# Patient Record
Sex: Female | Born: 1950 | Race: White | Hispanic: No | Marital: Married | Smoking: Never smoker
Health system: Southern US, Community
[De-identification: ages and names within clinical notes are randomized; demographics above are authoritative.]

## PROBLEM LIST (undated history)

## (undated) DIAGNOSIS — M5137 Other intervertebral disc degeneration, lumbosacral region: Secondary | ICD-10-CM

## (undated) DIAGNOSIS — I10 Essential (primary) hypertension: Secondary | ICD-10-CM

## (undated) DIAGNOSIS — Z9221 Personal history of antineoplastic chemotherapy: Secondary | ICD-10-CM

## (undated) DIAGNOSIS — I5030 Unspecified diastolic (congestive) heart failure: Secondary | ICD-10-CM

## (undated) DIAGNOSIS — T4145XA Adverse effect of unspecified anesthetic, initial encounter: Secondary | ICD-10-CM

## (undated) DIAGNOSIS — M792 Neuralgia and neuritis, unspecified: Secondary | ICD-10-CM

## (undated) DIAGNOSIS — D509 Iron deficiency anemia, unspecified: Secondary | ICD-10-CM

## (undated) DIAGNOSIS — M797 Fibromyalgia: Secondary | ICD-10-CM

## (undated) DIAGNOSIS — J189 Pneumonia, unspecified organism: Secondary | ICD-10-CM

## (undated) DIAGNOSIS — M549 Dorsalgia, unspecified: Secondary | ICD-10-CM

## (undated) DIAGNOSIS — F411 Generalized anxiety disorder: Secondary | ICD-10-CM

## (undated) DIAGNOSIS — T8859XA Other complications of anesthesia, initial encounter: Secondary | ICD-10-CM

## (undated) DIAGNOSIS — R519 Headache, unspecified: Secondary | ICD-10-CM

## (undated) DIAGNOSIS — M5030 Other cervical disc degeneration, unspecified cervical region: Secondary | ICD-10-CM

## (undated) DIAGNOSIS — Z9289 Personal history of other medical treatment: Secondary | ICD-10-CM

## (undated) DIAGNOSIS — R51 Headache: Secondary | ICD-10-CM

## (undated) DIAGNOSIS — I890 Lymphedema, not elsewhere classified: Secondary | ICD-10-CM

## (undated) DIAGNOSIS — Z923 Personal history of irradiation: Secondary | ICD-10-CM

## (undated) DIAGNOSIS — M51379 Other intervertebral disc degeneration, lumbosacral region without mention of lumbar back pain or lower extremity pain: Secondary | ICD-10-CM

## (undated) DIAGNOSIS — R112 Nausea with vomiting, unspecified: Secondary | ICD-10-CM

## (undated) DIAGNOSIS — K219 Gastro-esophageal reflux disease without esophagitis: Secondary | ICD-10-CM

## (undated) DIAGNOSIS — C50919 Malignant neoplasm of unspecified site of unspecified female breast: Secondary | ICD-10-CM

## (undated) DIAGNOSIS — I5189 Other ill-defined heart diseases: Secondary | ICD-10-CM

## (undated) DIAGNOSIS — M5135 Other intervertebral disc degeneration, thoracolumbar region: Secondary | ICD-10-CM

## (undated) DIAGNOSIS — E669 Obesity, unspecified: Secondary | ICD-10-CM

## (undated) DIAGNOSIS — E785 Hyperlipidemia, unspecified: Secondary | ICD-10-CM

## (undated) DIAGNOSIS — Z7189 Other specified counseling: Secondary | ICD-10-CM

## (undated) DIAGNOSIS — Z9889 Other specified postprocedural states: Secondary | ICD-10-CM

## (undated) DIAGNOSIS — F419 Anxiety disorder, unspecified: Secondary | ICD-10-CM

## (undated) DIAGNOSIS — R011 Cardiac murmur, unspecified: Secondary | ICD-10-CM

## (undated) DIAGNOSIS — G8929 Other chronic pain: Secondary | ICD-10-CM

## (undated) DIAGNOSIS — F339 Major depressive disorder, recurrent, unspecified: Secondary | ICD-10-CM

## (undated) DIAGNOSIS — Z86711 Personal history of pulmonary embolism: Secondary | ICD-10-CM

## (undated) DIAGNOSIS — Z8719 Personal history of other diseases of the digestive system: Secondary | ICD-10-CM

## (undated) DIAGNOSIS — J42 Unspecified chronic bronchitis: Secondary | ICD-10-CM

## (undated) HISTORY — DX: Personal history of irradiation: Z92.3

## (undated) HISTORY — DX: Hyperlipidemia, unspecified: E78.5

## (undated) HISTORY — PX: UMBILICAL HERNIA REPAIR: SHX196

## (undated) HISTORY — PX: BREAST LUMPECTOMY: SHX2

## (undated) HISTORY — DX: Other specified counseling: Z71.89

## (undated) HISTORY — PX: INGUINAL HERNIA REPAIR: SUR1180

## (undated) HISTORY — PX: FOOT SURGERY: SHX648

## (undated) HISTORY — PX: DILATION AND CURETTAGE OF UTERUS: SHX78

## (undated) HISTORY — PX: ABDOMINAL HYSTERECTOMY: SHX81

## (undated) HISTORY — PX: APPENDECTOMY: SHX54

## (undated) HISTORY — PX: BREAST BIOPSY: SHX20

## (undated) HISTORY — PX: TONSILLECTOMY: SUR1361

---

## 2012-10-02 DIAGNOSIS — C50919 Malignant neoplasm of unspecified site of unspecified female breast: Secondary | ICD-10-CM

## 2012-10-02 HISTORY — DX: Malignant neoplasm of unspecified site of unspecified female breast: C50.919

## 2012-12-24 DIAGNOSIS — T451X5A Adverse effect of antineoplastic and immunosuppressive drugs, initial encounter: Secondary | ICD-10-CM | POA: Diagnosis present

## 2012-12-24 DIAGNOSIS — I498 Other specified cardiac arrhythmias: Secondary | ICD-10-CM | POA: Diagnosis present

## 2012-12-24 DIAGNOSIS — Z9049 Acquired absence of other specified parts of digestive tract: Secondary | ICD-10-CM | POA: Diagnosis not present

## 2012-12-24 DIAGNOSIS — I10 Essential (primary) hypertension: Secondary | ICD-10-CM | POA: Diagnosis present

## 2012-12-24 DIAGNOSIS — R0902 Hypoxemia: Secondary | ICD-10-CM | POA: Diagnosis present

## 2012-12-24 DIAGNOSIS — M5137 Other intervertebral disc degeneration, lumbosacral region: Secondary | ICD-10-CM | POA: Diagnosis present

## 2012-12-24 DIAGNOSIS — K449 Diaphragmatic hernia without obstruction or gangrene: Secondary | ICD-10-CM | POA: Diagnosis present

## 2012-12-24 DIAGNOSIS — R911 Solitary pulmonary nodule: Secondary | ICD-10-CM | POA: Diagnosis present

## 2012-12-24 DIAGNOSIS — Z8711 Personal history of peptic ulcer disease: Secondary | ICD-10-CM | POA: Diagnosis not present

## 2012-12-24 DIAGNOSIS — K219 Gastro-esophageal reflux disease without esophagitis: Secondary | ICD-10-CM | POA: Diagnosis present

## 2012-12-24 DIAGNOSIS — F329 Major depressive disorder, single episode, unspecified: Secondary | ICD-10-CM | POA: Diagnosis present

## 2012-12-24 DIAGNOSIS — B9689 Other specified bacterial agents as the cause of diseases classified elsewhere: Secondary | ICD-10-CM | POA: Diagnosis not present

## 2012-12-24 DIAGNOSIS — K589 Irritable bowel syndrome without diarrhea: Secondary | ICD-10-CM | POA: Diagnosis present

## 2012-12-24 DIAGNOSIS — G589 Mononeuropathy, unspecified: Secondary | ICD-10-CM | POA: Diagnosis present

## 2012-12-24 DIAGNOSIS — F411 Generalized anxiety disorder: Secondary | ICD-10-CM | POA: Diagnosis present

## 2012-12-24 DIAGNOSIS — R195 Other fecal abnormalities: Secondary | ICD-10-CM | POA: Diagnosis present

## 2012-12-24 DIAGNOSIS — Z90710 Acquired absence of both cervix and uterus: Secondary | ICD-10-CM | POA: Diagnosis not present

## 2012-12-24 DIAGNOSIS — E876 Hypokalemia: Secondary | ICD-10-CM | POA: Diagnosis not present

## 2012-12-24 DIAGNOSIS — M479 Spondylosis, unspecified: Secondary | ICD-10-CM | POA: Diagnosis present

## 2012-12-24 DIAGNOSIS — N390 Urinary tract infection, site not specified: Secondary | ICD-10-CM | POA: Diagnosis not present

## 2012-12-24 DIAGNOSIS — D649 Anemia, unspecified: Secondary | ICD-10-CM | POA: Diagnosis not present

## 2012-12-24 DIAGNOSIS — C50919 Malignant neoplasm of unspecified site of unspecified female breast: Secondary | ICD-10-CM | POA: Diagnosis not present

## 2012-12-24 DIAGNOSIS — E871 Hypo-osmolality and hyponatremia: Secondary | ICD-10-CM | POA: Diagnosis not present

## 2012-12-24 DIAGNOSIS — J45909 Unspecified asthma, uncomplicated: Secondary | ICD-10-CM | POA: Diagnosis present

## 2012-12-24 DIAGNOSIS — J42 Unspecified chronic bronchitis: Secondary | ICD-10-CM | POA: Diagnosis present

## 2012-12-24 DIAGNOSIS — D6481 Anemia due to antineoplastic chemotherapy: Secondary | ICD-10-CM | POA: Diagnosis not present

## 2013-01-23 DIAGNOSIS — L02818 Cutaneous abscess of other sites: Secondary | ICD-10-CM | POA: Diagnosis not present

## 2013-01-23 DIAGNOSIS — K589 Irritable bowel syndrome without diarrhea: Secondary | ICD-10-CM | POA: Diagnosis present

## 2013-01-23 DIAGNOSIS — D6481 Anemia due to antineoplastic chemotherapy: Secondary | ICD-10-CM | POA: Diagnosis present

## 2013-01-23 DIAGNOSIS — M479 Spondylosis, unspecified: Secondary | ICD-10-CM | POA: Diagnosis present

## 2013-01-23 DIAGNOSIS — G8929 Other chronic pain: Secondary | ICD-10-CM | POA: Diagnosis present

## 2013-01-23 DIAGNOSIS — E876 Hypokalemia: Secondary | ICD-10-CM | POA: Diagnosis present

## 2013-01-23 DIAGNOSIS — L02419 Cutaneous abscess of limb, unspecified: Secondary | ICD-10-CM | POA: Diagnosis present

## 2013-01-23 DIAGNOSIS — M5137 Other intervertebral disc degeneration, lumbosacral region: Secondary | ICD-10-CM | POA: Diagnosis present

## 2013-01-23 DIAGNOSIS — R000 Tachycardia, unspecified: Secondary | ICD-10-CM | POA: Diagnosis present

## 2013-01-23 DIAGNOSIS — R52 Pain, unspecified: Secondary | ICD-10-CM | POA: Diagnosis present

## 2013-01-23 DIAGNOSIS — G589 Mononeuropathy, unspecified: Secondary | ICD-10-CM | POA: Diagnosis present

## 2013-01-23 DIAGNOSIS — Z90710 Acquired absence of both cervix and uterus: Secondary | ICD-10-CM | POA: Diagnosis not present

## 2013-01-23 DIAGNOSIS — F411 Generalized anxiety disorder: Secondary | ICD-10-CM | POA: Diagnosis present

## 2013-01-23 DIAGNOSIS — I959 Hypotension, unspecified: Secondary | ICD-10-CM | POA: Diagnosis not present

## 2013-01-23 DIAGNOSIS — Z888 Allergy status to other drugs, medicaments and biological substances status: Secondary | ICD-10-CM | POA: Diagnosis not present

## 2013-01-23 DIAGNOSIS — Z9049 Acquired absence of other specified parts of digestive tract: Secondary | ICD-10-CM | POA: Diagnosis not present

## 2013-01-23 DIAGNOSIS — Z79899 Other long term (current) drug therapy: Secondary | ICD-10-CM | POA: Diagnosis not present

## 2013-01-23 DIAGNOSIS — C50919 Malignant neoplasm of unspecified site of unspecified female breast: Secondary | ICD-10-CM | POA: Diagnosis not present

## 2013-01-23 DIAGNOSIS — Z91041 Radiographic dye allergy status: Secondary | ICD-10-CM | POA: Diagnosis not present

## 2013-01-23 DIAGNOSIS — Z9221 Personal history of antineoplastic chemotherapy: Secondary | ICD-10-CM | POA: Diagnosis not present

## 2013-01-23 DIAGNOSIS — Z8711 Personal history of peptic ulcer disease: Secondary | ICD-10-CM | POA: Diagnosis not present

## 2013-01-23 DIAGNOSIS — I890 Lymphedema, not elsewhere classified: Secondary | ICD-10-CM | POA: Diagnosis present

## 2013-01-23 DIAGNOSIS — L270 Generalized skin eruption due to drugs and medicaments taken internally: Secondary | ICD-10-CM | POA: Diagnosis not present

## 2013-01-23 DIAGNOSIS — L03818 Cellulitis of other sites: Secondary | ICD-10-CM | POA: Diagnosis not present

## 2013-01-23 DIAGNOSIS — T451X5A Adverse effect of antineoplastic and immunosuppressive drugs, initial encounter: Secondary | ICD-10-CM | POA: Diagnosis not present

## 2013-01-23 DIAGNOSIS — I10 Essential (primary) hypertension: Secondary | ICD-10-CM | POA: Diagnosis present

## 2013-02-19 ENCOUNTER — Ambulatory Visit
Admission: RE | Admit: 2013-02-19 | Discharge: 2013-02-19 | Disposition: A | Discharge status: Home / Self Care | Payer: BC Managed Care – PPO | Source: Ambulatory Visit | Attending: Radiation Oncology | Admitting: Radiation Oncology

## 2013-02-19 ENCOUNTER — Encounter: Payer: Self-pay | Admitting: Radiation Oncology

## 2013-02-19 VITALS — BP 104/60 | HR 90 | Temp 98.6°F | Ht 68.0 in | Wt 209.3 lb

## 2013-02-19 DIAGNOSIS — C50419 Malignant neoplasm of upper-outer quadrant of unspecified female breast: Secondary | ICD-10-CM | POA: Insufficient documentation

## 2013-02-19 DIAGNOSIS — F411 Generalized anxiety disorder: Secondary | ICD-10-CM | POA: Insufficient documentation

## 2013-02-19 DIAGNOSIS — M549 Dorsalgia, unspecified: Secondary | ICD-10-CM | POA: Insufficient documentation

## 2013-02-19 DIAGNOSIS — Z79899 Other long term (current) drug therapy: Secondary | ICD-10-CM | POA: Insufficient documentation

## 2013-02-19 DIAGNOSIS — R609 Edema, unspecified: Secondary | ICD-10-CM | POA: Insufficient documentation

## 2013-02-19 DIAGNOSIS — R222 Localized swelling, mass and lump, trunk: Secondary | ICD-10-CM | POA: Insufficient documentation

## 2013-02-19 DIAGNOSIS — Z9221 Personal history of antineoplastic chemotherapy: Secondary | ICD-10-CM | POA: Insufficient documentation

## 2013-02-19 DIAGNOSIS — C50911 Malignant neoplasm of unspecified site of right female breast: Secondary | ICD-10-CM

## 2013-02-19 DIAGNOSIS — Z170 Estrogen receptor positive status [ER+]: Secondary | ICD-10-CM | POA: Insufficient documentation

## 2013-02-19 DIAGNOSIS — G8929 Other chronic pain: Secondary | ICD-10-CM | POA: Insufficient documentation

## 2013-02-19 DIAGNOSIS — C50411 Malignant neoplasm of upper-outer quadrant of right female breast: Secondary | ICD-10-CM

## 2013-02-19 DIAGNOSIS — R5381 Other malaise: Secondary | ICD-10-CM | POA: Insufficient documentation

## 2013-02-19 HISTORY — DX: Malignant neoplasm of unspecified site of unspecified female breast: C50.919

## 2013-02-19 HISTORY — DX: Personal history of antineoplastic chemotherapy: Z92.21

## 2013-02-19 HISTORY — DX: Obesity, unspecified: E66.9

## 2013-02-19 HISTORY — DX: Personal history of pulmonary embolism: Z86.711

## 2013-02-19 HISTORY — DX: Dorsalgia, unspecified: M54.9

## 2013-02-19 HISTORY — DX: Other chronic pain: G89.29

## 2013-02-19 HISTORY — DX: Iron deficiency anemia, unspecified: D50.9

## 2013-02-19 LAB — CBC WITH DIFFERENTIAL/PLATELET
Eosinophils Absolute: 0 10*3/uL (ref 0.0–0.5)
MCV: 84.3 fL (ref 79.5–101.0)
MONO#: 0.6 10*3/uL (ref 0.1–0.9)
MONO%: 10.1 % (ref 0.0–14.0)
NEUT#: 3.6 10*3/uL (ref 1.5–6.5)
RBC: 3.7 10*6/uL (ref 3.70–5.45)
RDW: 18.1 % — ABNORMAL HIGH (ref 11.2–14.5)
WBC: 6.1 10*3/uL (ref 3.9–10.3)

## 2013-02-19 NOTE — Progress Notes (Signed)
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  Oncotype  Dx Score of 23  Did patient present with symptoms (if so, please note symptoms) or was this found on screening mammography?: Routine mammogram  Past/Anticipated interventions by surgeon, if any:  Lumpectomy ?  Past/Anticipated interventions by medical oncology, if any: 4 cycles of Taxotere and cytoxan  Lymphedema issues, if any:  Marked edema in lower legs since chemotherapy from the things to her feet.  Pain issues, if any:  Chronic Back Pain  SAFETY ISSUES:  Prior radiation? No  Pacemaker/ICD? No  Possible current pregnancy? No  Is the patient on methotrexate? No  Current Complaints / other details:  Bilateral edema lower extremities.  Was not referred to a Medical Oncologist and   Just moved to [place] and in the process of moving.  States this is very stressful and she reports increased anxiety.

## 2013-02-19 NOTE — Progress Notes (Signed)
Alexandria [name] here for consultation regarding her Invasive Ductal Carcinoma of the Right Breast

## 2013-02-19 NOTE — Progress Notes (Signed)
Radiation Oncology         [phone] ________________________________  Initial outpatient Consultation  Name: Alexandria [name] MRN: [id number]  Date: 02/19/2013  DOB: 01/12/1953  CC:No primary provider on file.  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 female who has worked in the past as an Insurance underwriter in [place]. She moved to [facility]  with her husband as he was relocated for new job. She recently completed chemotherapy for stage I grade 3 ER positive PR negative HER-2 negative left breast cancer. Her surgery had taken place on 10/21/12. Pathologic stage is as above. Margins clear. Her Oncotype DX score was 23. Therefore she received chemotherapy in the form of 4 cycles of Taxotere and Cytoxan. She completed her last cycle and 01/30/2013. She has had bilateral lower extremity edema and some skin changes as well as fatigue which she is still recovering from. She has some anxiety regarding her white counts which have been recovering.  She has numerous social stressors including her recent move, her has been signed new job, and her brother, who is actively dying of ALS in [place]. She has significant anxiety. She reports significant persistent lower extremity edema and skin changes which started with chemotherapy. She has also had weight gain with chemotherapy.  She reports chronic back problems -these are not new. She has been seen in the pain clinic in the past for this.  I am having her imaging get digitized from her outside hospital. 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 This was incidentally noted when she was intubated with sepsis in the hospital. According to the CT chest report from 10/02/2012, it has decreased in prominence  since 04/17/2011. It has been stable since 03/05/2012. It measures 1.8 x 0.5 cm as of her exam on 10/02/2012. It has not been biopsied per the patient   PREVIOUS RADIATION THERAPY: No  PAST MEDICAL HISTORY:  has a past medical history of Breast cancer (10/02/12); Iron deficiency anemia; Status post chemotherapy; Chronic back pain; pulmonary embolus; Neuropathy; and Obesity.    PAST SURGICAL HISTORY: Past Surgical History  Procedure Laterality Date  . Cesarean section    . Right breast lumpectomy      FAMILY HISTORY: family history is not on file.  SOCIAL HISTORY:  reports that she quit smoking about 6 years ago. Her smoking use included Cigarettes. She smoked 0.00 packs per day. She does not have any smokeless tobacco history on file.  ALLERGIES: Contrast media  MEDICATIONS:  Current Outpatient Prescriptions  Medication Sig Dispense Refill  . ferrous sulfate 325 (65 FE) MG tablet Take 325 mg by mouth daily with breakfast.      . furosemide (LASIX) 20 MG tablet Take 40 mg by mouth 2 (two) times daily. Take 1-2 prn      . HYDROmorphone (DILAUDID) 4 MG tablet Take 4 mg by mouth every 3 (three) hours as needed for pain.      Marland Kitchen levalbuterol (XOPENEX HFA) 45 MCG/ACT inhaler Inhale 1-2 puffs into the lungs as needed for wheezing.      . lidocaine-prilocaine (EMLA) cream Apply 1 application topically as needed.      Marland Kitchen LORazepam (ATIVAN) 1 MG tablet Take 1 mg by mouth every 8 (eight) hours  as needed for anxiety. Takes Q8hrs to Q 12 hrs.      . lubiprostone (AMITIZA) 24 MCG capsule Take 24 mcg by mouth 2 (two) times daily with a meal.      . morphine (MS CONTIN) 15 MG 12 hr tablet Take 30 mg by mouth 3 (three) times daily. Q 12 hours      . potassium chloride (KLOR-CON) 20 MEQ packet Take 20 mEq by mouth 2 (two) times daily.       Marland Kitchen LORazepam (ATIVAN) 0.5 MG tablet Take 1 tablet (0.5 mg total) by mouth once. Take 20-30 min before radiation treatments.  33 tablet  0   No current  facility-administered medications for this encounter.    REVIEW OF SYSTEMS:     Pertinent items are noted in HPI.   PHYSICAL EXAM:  height is 5\' 8"  (1.727 m) and weight is 209 lb 4.8 oz (94.938 kg). Her temperature is 98.6 F (37 C). Her blood pressure is 104/60 and her pulse is 90.   General: Alert and oriented, in mild distress HEENT: Head is normocephalic. Pupils are equally round and reactive to light. Extraocular movements are intact. Oropharynx is clear. Neck: Neck is supple, no palpable cervical or supraclavicular lymphadenopathy. Heart: Regular in rate and rhythm with no murmurs, rubs, or gallops. Chest: scattered wheezes b/l Abdomen: Soft, nontender, nondistended, with no rigidity or guarding. Extremities: Significant lower extremity edema bilaterally with wraps dressed around her lower legs Lymphatics: No concerning lymphadenopathy. Musculoskeletal: Notable for significant discomfort in the left shoulder when she raises her arm Neurologic: Cranial nerves II through XII are grossly intact. No obvious focalities. Speech is fluent. Coordination is intact. Psychiatric: Judgment and insight are intact. Affect is anxious Breasts: Lumpectomy scar has healed well. No palpable lesions of concern in either breast or in the axillary regions bilaterally   LABORATORY DATA:   CBC - ordered after consult    Component Value Date/Time   WBC 6.1 02/19/2013 1457   RBC 3.70 02/19/2013 1457   HGB 10.2* 02/19/2013 1457   HCT 31.2* 02/19/2013 1457   PLT 276 02/19/2013 1457   MCV 84.3 02/19/2013 1457   MCH 27.5 02/19/2013 1457   MCHC 32.6 02/19/2013 1457   RDW 18.1* 02/19/2013 1457   LYMPHSABS 1.8 02/19/2013 1457   MONOABS 0.6 02/19/2013 1457   EOSABS 0.0 02/19/2013 1457   BASOSABS 0.0 02/19/2013 1457    RADIOGRAPHY: No results found.    IMPRESSION/PLAN: It was a pleasure meeting the patient today. We discussed the risks, benefits, and side effects of radiotherapy. We discussed that radiation to the right breast  would take approximately 6 weeks to complete. The benefit of radiotherapy is that it would decrease her chance of a local regional recurrence by about two thirds.  We spoke about acute effects including skin irritation and fatigue as well as much less common late effects including lung irritation. We spoke about the latest technology that is used to minimize the risk of late effects for breast cancer patients undergoing radiotherapy. No guarantees of treatment were given. The patient is enthusiastic about proceeding with treatment. I look forward to participating in the patient's care. She signed a consent form, and her simulation will take place on June 6.  She is concerned about her white blood cell count so a CBC has been ordered for today.  She has multiple stressors and therefore I asked social work to come down today, they will meet with her after my consultation.  She  requests referral to the pain clinic. I will do that today. I will also refer her to medical oncology for long-term followup.    I spent 60 minutes minutes face to face with the patient and more than 50% of that time was spent in counseling and/or coordination of care.    __________________________________________   Lonie Peak, MD

## 2013-02-19 NOTE — Progress Notes (Signed)
Called and left voice message for Alexandria [name] that her Hgb. is 10.2 vs. Her prior of 10.6.   Stated that Dr. Basilio Cairo feels that this is appropriate at this time and no cause for worry.  Also reported that her WBC is WNL of 6.1.   Stated that she will be called tomorrow to answer any questions she may have.

## 2013-02-21 ENCOUNTER — Encounter: Payer: Self-pay | Admitting: Radiation Oncology

## 2013-02-21 ENCOUNTER — Ambulatory Visit
Admission: RE | Admit: 2013-02-21 | Discharge: 2013-02-21 | Disposition: A | Discharge status: Home / Self Care | Payer: Medicare Other | Source: Ambulatory Visit | Attending: Radiation Oncology | Admitting: Radiation Oncology

## 2013-02-21 DIAGNOSIS — C50911 Malignant neoplasm of unspecified site of right female breast: Secondary | ICD-10-CM

## 2013-02-21 DIAGNOSIS — J449 Chronic obstructive pulmonary disease, unspecified: Secondary | ICD-10-CM | POA: Insufficient documentation

## 2013-02-21 DIAGNOSIS — C50411 Malignant neoplasm of upper-outer quadrant of right female breast: Secondary | ICD-10-CM

## 2013-02-21 DIAGNOSIS — Z510 Encounter for antineoplastic radiation therapy: Secondary | ICD-10-CM | POA: Insufficient documentation

## 2013-02-21 DIAGNOSIS — R609 Edema, unspecified: Secondary | ICD-10-CM | POA: Insufficient documentation

## 2013-02-21 DIAGNOSIS — F411 Generalized anxiety disorder: Secondary | ICD-10-CM | POA: Insufficient documentation

## 2013-02-21 DIAGNOSIS — C50919 Malignant neoplasm of unspecified site of unspecified female breast: Secondary | ICD-10-CM | POA: Insufficient documentation

## 2013-02-21 DIAGNOSIS — J4489 Other specified chronic obstructive pulmonary disease: Secondary | ICD-10-CM | POA: Insufficient documentation

## 2013-02-21 DIAGNOSIS — K5900 Constipation, unspecified: Secondary | ICD-10-CM | POA: Insufficient documentation

## 2013-02-21 MED ORDER — LORAZEPAM 0.5 MG PO TABS: 0.5000 mg | ORAL_TABLET | Freq: Once | ORAL | Status: DC

## 2013-02-21 NOTE — Progress Notes (Signed)
  Radiation Oncology         [phone]) [phone] ________________________________  Name: Tomeko Scoville MRN: [id number]  Date: 02/21/2013  DOB: 01/12/1953  SIMULATION AND TREATMENT PLANNING NOTE  Outpatient  DIAGNOSIS:  T1cN0M0 Right Breast Cacner  NARRATIVE:  The patient was brought to the CT Simulation planning suite.  Identity was confirmed.  All relevant records and images related to the planned course of therapy were reviewed.  The patient freely provided informed written consent to proceed with treatment after reviewing the details related to the planned course of therapy. The consent form was witnessed and verified by the simulation staff.    Then, the patient was set-up in a stable reproducible  left lateral decub position for radiation therapy - head in accuform, arms in breastboard.  CT images were obtained.  Surface markings were placed.  The CT images were loaded into the planning software.    TREATMENT PLANNING NOTE: Treatment planning then occurred.  The radiation prescription was entered and confirmed.    A total of 3  medically necessary complex treatment devices were fabricated and supervised by me- accuform and 2 opposed tangent fields with MLCs for custom blocks against lung and liver tissue. I have requested : Isodose Plan/dose calc's.   The patient will receive 50 Gy in 25 fractions to the right breast, followed by a 10 Gy/ 5 fraction boost (to be planned later) with electrons to the lumpectomy cavity.   -----------------------------------  Lonie Peak, MD

## 2013-02-21 NOTE — Progress Notes (Signed)
Met with patient to discuss RO billing.  Patient concerned about cost.  Will call insurance to find out OOP, deductible, coins, copay and get back with her by Monday.

## 2013-02-25 ENCOUNTER — Telehealth: Payer: Self-pay | Admitting: *Deleted

## 2013-02-25 NOTE — Telephone Encounter (Signed)
Left message for pt to return my call so I can see if she is able to come meet w/ Dr. Welton Flakes on 03/07/13 at 3/3:30 and then do rad tx right after.

## 2013-02-26 ENCOUNTER — Encounter: Payer: Self-pay | Admitting: Radiation Oncology

## 2013-02-26 ENCOUNTER — Telehealth: Payer: Self-pay | Admitting: *Deleted

## 2013-02-26 NOTE — Progress Notes (Signed)
Called [employer] [place] to check patient's benefit information - for op facility, plan has 0% coinsurance in network; 20% out of network; Deductible: in network: $100 individual ($0 remaining); family $200 ($30 remaining); out of network $1000 individual ($1000 remaining), family $2000 [phone] remaining); Maximums:  None for in-network; out of network: $2500 ($2205 remaining) and excludes deductible

## 2013-02-26 NOTE — Telephone Encounter (Signed)
Called patient to inform of appt. With Dr. Shirleen Schirmer for a return call

## 2013-02-28 ENCOUNTER — Ambulatory Visit: Admission: RE | Admit: 2013-02-28 | Payer: Medicare Other | Source: Ambulatory Visit | Admitting: Radiation Oncology

## 2013-02-28 ENCOUNTER — Ambulatory Visit: Payer: Medicare Other | Admitting: Radiation Oncology

## 2013-03-03 ENCOUNTER — Other Ambulatory Visit: Payer: Self-pay | Admitting: Radiation Oncology

## 2013-03-03 ENCOUNTER — Ambulatory Visit
Admission: RE | Admit: 2013-03-03 | Discharge: 2013-03-03 | Disposition: A | Discharge status: Home / Self Care | Payer: Medicare Other | Source: Ambulatory Visit | Attending: Radiation Oncology | Admitting: Radiation Oncology

## 2013-03-03 ENCOUNTER — Inpatient Hospital Stay
Admission: RE | Admit: 2013-03-03 | Discharge: 2013-03-03 | Disposition: A | Discharge status: Home / Self Care | Payer: Self-pay | Source: Ambulatory Visit | Attending: Radiation Oncology | Admitting: Radiation Oncology

## 2013-03-03 ENCOUNTER — Other Ambulatory Visit: Payer: Self-pay | Admitting: Emergency Medicine

## 2013-03-03 DIAGNOSIS — C50919 Malignant neoplasm of unspecified site of unspecified female breast: Secondary | ICD-10-CM

## 2013-03-03 DIAGNOSIS — C50411 Malignant neoplasm of upper-outer quadrant of right female breast: Secondary | ICD-10-CM

## 2013-03-03 DIAGNOSIS — C50419 Malignant neoplasm of upper-outer quadrant of unspecified female breast: Secondary | ICD-10-CM

## 2013-03-03 NOTE — Progress Notes (Signed)
Simulation Verification Note- R Breast Outpatient  The patient was brought to the treatment unit and placed in the planned treatment position. The clinical setup was verified. Then port films were obtained and uploaded to the radiation oncology medical record software.  The treatment beams were carefully compared against the planned radiation fields. The position location and shape of the radiation fields was reviewed. They targeted volume of tissue appears to be appropriately covered by the radiation beams. Organs at risk appear to be excluded as planned.  Based on my personal review, I approved the simulation verification. The patient's treatment will proceed as planned.  -----------------------------------  Lonie Peak, MD

## 2013-03-04 ENCOUNTER — Ambulatory Visit
Admission: RE | Admit: 2013-03-04 | Discharge: 2013-03-04 | Disposition: A | Discharge status: Home / Self Care | Payer: Medicare Other | Source: Ambulatory Visit | Attending: Radiation Oncology | Admitting: Radiation Oncology

## 2013-03-04 ENCOUNTER — Encounter: Payer: Self-pay | Admitting: Oncology

## 2013-03-04 ENCOUNTER — Ambulatory Visit: Payer: BC Managed Care – PPO

## 2013-03-04 ENCOUNTER — Other Ambulatory Visit (HOSPITAL_BASED_OUTPATIENT_CLINIC_OR_DEPARTMENT_OTHER): Payer: BC Managed Care – PPO

## 2013-03-04 ENCOUNTER — Ambulatory Visit (HOSPITAL_BASED_OUTPATIENT_CLINIC_OR_DEPARTMENT_OTHER): Payer: BC Managed Care – PPO | Admitting: Oncology

## 2013-03-04 VITALS — BP 133/79 | HR 99 | Temp 98.0°F | Resp 20 | Ht 68.0 in | Wt 209.7 lb

## 2013-03-04 DIAGNOSIS — C50419 Malignant neoplasm of upper-outer quadrant of unspecified female breast: Secondary | ICD-10-CM | POA: Diagnosis not present

## 2013-03-04 DIAGNOSIS — C50919 Malignant neoplasm of unspecified site of unspecified female breast: Secondary | ICD-10-CM | POA: Diagnosis not present

## 2013-03-04 DIAGNOSIS — C50411 Malignant neoplasm of upper-outer quadrant of right female breast: Secondary | ICD-10-CM

## 2013-03-04 DIAGNOSIS — R609 Edema, unspecified: Secondary | ICD-10-CM | POA: Diagnosis not present

## 2013-03-04 DIAGNOSIS — R600 Localized edema: Secondary | ICD-10-CM

## 2013-03-04 DIAGNOSIS — C50911 Malignant neoplasm of unspecified site of right female breast: Secondary | ICD-10-CM

## 2013-03-04 LAB — CBC WITH DIFFERENTIAL/PLATELET
Eosinophils Absolute: 0.3 10*3/uL (ref 0.0–0.5)
LYMPH%: 24.2 % (ref 14.0–49.7)
MONO#: 0.6 10*3/uL (ref 0.1–0.9)
NEUT#: 2.7 10*3/uL (ref 1.5–6.5)
Platelets: 273 10*3/uL (ref 145–400)
RBC: 3.95 10*6/uL (ref 3.70–5.45)
WBC: 4.7 10*3/uL (ref 3.9–10.3)
lymph#: 1.1 10*3/uL (ref 0.9–3.3)

## 2013-03-04 LAB — COMPREHENSIVE METABOLIC PANEL (CC13)
AST: 23 U/L (ref 5–34)
Albumin: 3.6 g/dL (ref 3.5–5.0)
Alkaline Phosphatase: 129 U/L (ref 40–150)
BUN: 10 mg/dL (ref 7.0–26.0)
Potassium: 3.2 mEq/L — ABNORMAL LOW (ref 3.5–5.1)
Sodium: 140 mEq/L (ref 136–145)
Total Bilirubin: 0.39 mg/dL (ref 0.20–1.20)
Total Protein: 7.4 g/dL (ref 6.4–8.3)

## 2013-03-04 MED ORDER — SODIUM CHLORIDE 0.9 % IJ SOLN
10.0000 mL | INTRAMUSCULAR | Status: DC | PRN
  Administered 2013-03-04: 10 mL via INTRAVENOUS
  Filled 2013-03-04: qty 10

## 2013-03-04 MED ORDER — HEPARIN SOD (PORK) LOCK FLUSH 100 UNIT/ML IV SOLN
500.0000 [IU] | Freq: Once | INTRAVENOUS | Status: AC
  Administered 2013-03-04: 500 [IU] via INTRAVENOUS
  Filled 2013-03-04: qty 5

## 2013-03-04 NOTE — Progress Notes (Signed)
Alexandria [name] [id number] 01/12/1953 62 y.o. 03/04/2013 2:38 PM  CC  No PCP Per Patient [address] [place] [address] Dr. Lonie Peak  REASON FOR CONSULTATION:  62 year old female with history of stage IA invasive ductal carcinoma of the upper-outer quadrant of the right breast. Patient was originally diagnosed in [place] she is now reestablishing her care and [facility].  STAGE:   Malignant neoplasm of upper-outer quadrant of female breast   Primary site: Breast (Right)   Staging method: AJCC 7th Edition   Pathologic: Stage IA (T1, N0, cM0) signed by Victorino December, MD on 03/26/2013  8:21 AM   Summary: Stage IA (T1, N0, cM0) ER positive, PR positive HER-2/neu negative Oncotype DX recurrence score 23, 10 year risk intermediate  REFERRING PHYSICIAN: Parks Ranger, MD  HISTORY OF PRESENT ILLNESS:  Alexandria [name] is a 62 y.o. female.  Without significant past medical history who in January 2014 had a mammogram performed that showed an abnormality in the right breast. This was in [place]. 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 Patient went on to have a right lumpectomy performed revealing a T1 C. N0 disease stage at diagnosis stage I. She subsequently had Oncotype DX performed that showed a recurrence score of 23 giving her a 10 year risk for recurrence but 10-30% in the intermediate risk category. Because of this she received adjuvant chemotherapy consisting of 4 cycles of Taxotere and Cytoxan. She tolerated it well. She is now transferring her care to [place] to be closer to her family. She has not had radiation therapy and she is going to be seeing Dr. Lonie Peak for this.   Past Medical History: Past Medical History  Diagnosis Date  . Breast cancer 10/02/12    Invasive Ductal Carcinoma of the  Right Upper Outer Quadrant - ER (>90%), PR - Neg., Her2 Neu Negative, Ki-67 Unknown  . Iron deficiency anemia   . Status post chemotherapy     4 cycles of Taxotere and cytoxan  . Chronic back pain   . Hx of pulmonary embolus     During c-Section  . Neuropathy   . Obesity     Class 2    Past Surgical History: Past Surgical History  Procedure Laterality Date  . Cesarean section    . Right breast lumpectomy      Family History: Family History  Problem Relation Age of Onset  . Parkinson's disease Mother     Social History History  Substance Use Topics  . Smoking status: Never Smoker   . Smokeless tobacco: Never Used  . Alcohol Use: No    Allergies: Allergies  Allergen Reactions  . Contrast Media (Iodinated Diagnostic Agents) Anaphylaxis    Current Medications: Current Outpatient Prescriptions  Medication Sig Dispense Refill  . ferrous sulfate 325 (65 FE) MG tablet Take 325 mg by mouth daily with breakfast.      . furosemide (LASIX) 20 MG tablet Take 40 mg by mouth 2 (two) times daily. Take 1-2 prn      . HYDROmorphone (DILAUDID) 4 MG tablet Take 4 mg by mouth every 3 (three) hours as needed for pain.      Marland Kitchen lidocaine-prilocaine (EMLA) cream Apply 1 application topically as needed.      Marland Kitchen LORazepam (ATIVAN) 0.5 MG tablet Take 1 tablet (0.5 mg total) by mouth  once. Take 20-30 min before radiation treatments.  33 tablet  0  . lubiprostone (AMITIZA) 24 MCG capsule Take 24 mcg by mouth 2 (two) times daily with a meal.      . morphine (MS CONTIN) 15 MG 12 hr tablet Take 30 mg by mouth 3 (three) times daily. Q 12 hours      . potassium chloride (KLOR-CON) 20 MEQ packet Take 20 mEq by mouth 2 (two) times daily.       Marland Kitchen levalbuterol (XOPENEX HFA) 45 MCG/ACT inhaler Inhale 1-2 puffs into the lungs as needed for wheezing.       No current facility-administered medications for this visit.    OB/GYN History: Menarche at age 3 she is postmenopausal no hormone replacement therapy  use. First live birth was at 55.  Fertility Discussion: Not applicable Prior History of Cancer: No  Health Maintenance:  Colonoscopy 2013 Bone Density 2001 Last PAP smear 2004  ECOG PERFORMANCE STATUS: 0 - Asymptomatic  Genetic Counseling/testing: no  REVIEW OF SYSTEMS: Comprehensive review of systems was performed. Patient does have some aches and pains due to her arthritis. Remainder of the 14 point review of systems is negative. PHYSICAL EXAMINATION: Blood pressure 133/79, pulse 99, temperature 98 F (36.7 C), temperature source Oral, resp. rate 20, height 5\' 8"  (1.727 m), weight 209 lb 11.2 oz (95.119 kg).  Well-developed well-nourished female in no acute distress HEENT exam EOMI PERRLA sclerae anicteric no conjunctival pallor oral mucosa is moist neck is supple lungs clear cardiovascular regular rate rhythm abdomen is soft nontender no HSM extremities no edema neuro patient's alert oriented otherwise non-focal Right breast examination well-healed surgical scar no nodularity no nipple discharge no masses. Left breast no masses or nipple discharge    STUDIES/RESULTS: No results found.   LABS:    Chemistry      Component Value Date/Time   NA 140 03/04/2013 1239   K 3.2* 03/04/2013 1239   CL 99 03/04/2013 1239   CO2 30* 03/04/2013 1239   BUN 10.0 03/04/2013 1239   CREATININE 0.9 03/04/2013 1239      Component Value Date/Time   CALCIUM 9.7 03/04/2013 1239   ALKPHOS 129 03/04/2013 1239   AST 23 03/04/2013 1239   ALT 14 03/04/2013 1239   BILITOT 0.39 03/04/2013 1239      Lab Results  Component Value Date   WBC 4.7 03/04/2013   HGB 10.7* 03/04/2013   HCT 32.4* 03/04/2013   MCV 82.1 03/04/2013   PLT 273 03/04/2013     ASSESSMENT    62 year old female with  #1 history of stage I (T1, N0, M0) invasive ductal carcinoma, grade 3 ER positive PR negative HER-2/neu negative. Patient is status post lumpectomy of the right breast with sentinel node biopsy. She then went on to  have Oncotype DX testing with her risk of recurrence in the intermediate category. Therefore she received adjuvant chemotherapy consisting of 4 cycles of Taxotere and Cytoxan with day 2 Neulasta. This was completed in April 2014. Thereafter patient has relocated to [facility]. She is now reestablishing her care. She has not had radiation therapy. She has also not been on antiestrogen therapy.  #2 history of DJD  #3 history of anemia requiring IV iron in the past from her medical records that I reviewed today.  Clinical Trial Eligibility: No Multidisciplinary conference discussion no     PLAN:    #1 patient will proceed with radiation therapy. She has an appointment with Dr. Lonie Peak.  #  2 patient does require a primary care physician and we talked about this at length.  #3 I will plan on starting her on adjuvant antiestrogen therapy with an aromatase inhibitor such as Arimidex after she completes her radiation we discussed the risks benefits and rationale. We also discussed use of tamoxifen should she not be able to tolerate an aromatase inhibitor.  #4 anemia: We will continue to monitor her and check her iron studies periodically if she needs parenteral iron we certainly will be able to get up for her as well.  #5 I will plan on seeing her back in about 6-8 weeks' time for followup.       Discussion: Patient is being treated per NCCN breast cancer care guidelines appropriate for stage.I   Thank you so much for allowing me to participate in the care of Derry Arbogast. I will continue to follow up the patient with you and assist in her care.  All questions were answered. The patient knows to call the clinic with any problems, questions or concerns. We can certainly see the patient much sooner if necessary.  I spent 55 minutes counseling the patient face to face. The total time spent in the appointment was 60 minutes.  Drue Second, MD Medical/Oncology [facility] [phone] (beeper) [phone] (Office)  03/04/2013, 2:38 PM

## 2013-03-04 NOTE — Patient Instructions (Addendum)
We discussed your diagnosis, pathology, and your treatment given in [place]  You are going to be receiving radiation therapy by Dr. Basilio Cairo  I discussed the use anti-estrogen therapy adjuvantly with aromatase inhibitors such as arimidex  I will refer you to physical therapy for your lower leg swelling  Anastrozole tablets What is this medicine? ANASTROZOLE (an AS troe zole) is used to treat breast cancer in women who have gone through menopause. Some types of breast cancer depend on estrogen to grow, and this medicine can stop tumor growth by blocking estrogen production. This medicine may be used for other purposes; ask your health care provider or pharmacist if you have questions. What should I tell my health care provider before I take this medicine? They need to know if you have any of these conditions: -liver disease -an unusual or allergic reaction to anastrozole, other medicines, foods, dyes, or preservatives -pregnant or trying to get pregnant -breast-feeding How should I use this medicine? Take this medicine by mouth with a glass of water. Follow the directions on the prescription label. You can take this medicine with or without food. Take your doses at regular intervals. Do not take your medicine more often than directed. Do not stop taking except on the advice of your doctor or health care professional. Talk to your pediatrician regarding the use of this medicine in children. Special care may be needed. Overdosage: If you think you have taken too much of this medicine contact a poison control center or emergency room at once. NOTE: This medicine is only for you. Do not share this medicine with others. What if I miss a dose? If you miss a dose, take it as soon as you can. If it is almost time for your next dose, take only that dose. Do not take double or extra doses. What may interact with this medicine? Do not take this medicine with any of the following medications: -female  hormones, like estrogens or progestins and birth control pills This medicine may also interact with the following medications: -tamoxifen This list may not describe all possible interactions. Give your health care provider a list of all the medicines, herbs, non-prescription drugs, or dietary supplements you use. Also tell them if you smoke, drink alcohol, or use illegal drugs. Some items may interact with your medicine. What should I watch for while using this medicine? Visit your doctor or health care professional for regular checks on your progress. Let your doctor or health care professional know about any unusual vaginal bleeding. Do not treat yourself for diarrhea, nausea, vomiting or other side effects. Ask your doctor or health care professional for advice. What side effects may I notice from receiving this medicine? Side effects that you should report to your doctor or health care professional as soon as possible: -allergic reactions like skin rash, itching or hives, swelling of the face, lips, or tongue -any new or unusual symptoms -breathing problems -chest pain -leg pain or swelling -vomiting Side effects that usually do not require medical attention (report to your doctor or health care professional if they continue or are bothersome): -back or bone pain -cough, or throat infection -diarrhea or constipation -dizziness -headache -hot flashes -loss of appetite -nausea -sweating -weakness and tiredness -weight gain This list may not describe all possible side effects. Call your doctor for medical advice about side effects. You may report side effects to FDA at 1-800-FDA-1088. Where should I keep my medicine? Keep out of the reach of children. Store at  room temperature between 20 and 25 degrees C (68 and 77 degrees F). Throw away any unused medicine after the expiration date. NOTE: This sheet is a summary. It may not cover all possible information. If you have questions about  this medicine, talk to your doctor, pharmacist, or health care provider.  2013, Elsevier/Gold Standard. (11/15/2007 4:31:52 PM)

## 2013-03-04 NOTE — Patient Instructions (Addendum)
Call MD for problems 

## 2013-03-05 ENCOUNTER — Ambulatory Visit: Payer: Self-pay | Admitting: Radiation Oncology

## 2013-03-05 ENCOUNTER — Telehealth: Payer: Self-pay | Admitting: Oncology

## 2013-03-05 ENCOUNTER — Ambulatory Visit
Admission: RE | Admit: 2013-03-05 | Discharge: 2013-03-05 | Disposition: A | Discharge status: Home / Self Care | Payer: Medicare Other | Source: Ambulatory Visit | Attending: Radiation Oncology | Admitting: Radiation Oncology

## 2013-03-05 NOTE — Telephone Encounter (Signed)
, °

## 2013-03-06 ENCOUNTER — Ambulatory Visit
Admission: RE | Admit: 2013-03-06 | Discharge: 2013-03-06 | Disposition: A | Discharge status: Home / Self Care | Payer: Medicare Other | Source: Ambulatory Visit | Attending: Radiation Oncology | Admitting: Radiation Oncology

## 2013-03-07 ENCOUNTER — Ambulatory Visit: Payer: Medicare Other

## 2013-03-10 ENCOUNTER — Ambulatory Visit: Admission: RE | Admit: 2013-03-10 | Payer: Medicare Other | Source: Ambulatory Visit

## 2013-03-10 ENCOUNTER — Ambulatory Visit: Payer: Medicare Other

## 2013-03-11 ENCOUNTER — Ambulatory Visit: Payer: Medicare Other

## 2013-03-11 ENCOUNTER — Other Ambulatory Visit: Payer: Self-pay | Admitting: Medical Oncology

## 2013-03-11 DIAGNOSIS — C50411 Malignant neoplasm of upper-outer quadrant of right female breast: Secondary | ICD-10-CM

## 2013-03-11 DIAGNOSIS — C50911 Malignant neoplasm of unspecified site of right female breast: Secondary | ICD-10-CM

## 2013-03-11 MED ORDER — LORAZEPAM 0.5 MG PO TABS: 0.5000 mg | ORAL_TABLET | Freq: Once | ORAL | Status: DC

## 2013-03-12 ENCOUNTER — Ambulatory Visit: Admission: RE | Admit: 2013-03-12 | Payer: Medicare Other | Source: Ambulatory Visit

## 2013-03-13 ENCOUNTER — Ambulatory Visit
Admission: RE | Admit: 2013-03-13 | Discharge: 2013-03-13 | Disposition: A | Discharge status: Home / Self Care | Payer: Medicare Other | Source: Ambulatory Visit | Attending: Radiation Oncology | Admitting: Radiation Oncology

## 2013-03-13 ENCOUNTER — Ambulatory Visit: Payer: BC Managed Care – PPO | Attending: Oncology | Admitting: Physical Therapy

## 2013-03-13 VITALS — BP 127/80 | HR 107 | Temp 98.4°F | Ht 68.0 in | Wt 207.7 lb

## 2013-03-13 DIAGNOSIS — C50919 Malignant neoplasm of unspecified site of unspecified female breast: Secondary | ICD-10-CM | POA: Insufficient documentation

## 2013-03-13 DIAGNOSIS — R609 Edema, unspecified: Secondary | ICD-10-CM | POA: Insufficient documentation

## 2013-03-13 DIAGNOSIS — C50411 Malignant neoplasm of upper-outer quadrant of right female breast: Secondary | ICD-10-CM

## 2013-03-13 DIAGNOSIS — IMO0001 Reserved for inherently not codable concepts without codable children: Secondary | ICD-10-CM | POA: Insufficient documentation

## 2013-03-13 NOTE — Progress Notes (Signed)
Alexandria [name] here for weekly under treat visit. She has had 4 fractions to her right breast.  She is having anxiety attacks and has ativan that is helping.  She is fatigued.  She does have pain in her spine and legs that is chronic.  She rates it as a 8/10.  The skin on her right breast is intact.  She has been using radiaplex gel.

## 2013-03-13 NOTE — Progress Notes (Signed)
Weekly Management Note:  Site: Right breast Current Dose:  800  cGy Projected Dose: 5  cGy  Narrative: The patient is seen today for routine under treatment assessment. CBCT/MVCT images/port films were reviewed. The chart was reviewed.   She is without complaints today. She missed 4 treatment days, so I will have her treated this Saturday and then twice a day one day next week. She uses Radioplex gel.  Physical Examination:  Filed Vitals:   03/13/13 1708  BP: 127/80  Pulse: 107  Temp: 98.4 F (36.9 C)  .  Weight: 207 lb 11.2 oz (94.212 kg). No significant skin changes.  Impression: Tolerating radiation therapy well.  Plan: Continue radiation therapy as planned.

## 2013-03-14 ENCOUNTER — Ambulatory Visit
Admission: RE | Admit: 2013-03-14 | Discharge: 2013-03-14 | Disposition: A | Discharge status: Home / Self Care | Payer: Medicare Other | Source: Ambulatory Visit | Attending: Radiation Oncology | Admitting: Radiation Oncology

## 2013-03-15 ENCOUNTER — Ambulatory Visit
Admission: RE | Admit: 2013-03-15 | Discharge: 2013-03-15 | Disposition: A | Discharge status: Home / Self Care | Payer: Medicare Other | Source: Ambulatory Visit | Attending: Radiation Oncology | Admitting: Radiation Oncology

## 2013-03-17 ENCOUNTER — Ambulatory Visit
Admission: RE | Admit: 2013-03-17 | Discharge: 2013-03-17 | Disposition: A | Discharge status: Home / Self Care | Payer: Medicare Other | Source: Ambulatory Visit | Attending: Radiation Oncology | Admitting: Radiation Oncology

## 2013-03-17 ENCOUNTER — Encounter: Payer: Self-pay | Admitting: Radiation Oncology

## 2013-03-17 VITALS — BP 103/72 | HR 108 | Temp 99.0°F | Resp 20 | Wt 209.5 lb

## 2013-03-17 DIAGNOSIS — C50411 Malignant neoplasm of upper-outer quadrant of right female breast: Secondary | ICD-10-CM

## 2013-03-17 NOTE — Progress Notes (Signed)
Weekly rad txs, 7/25 completed rt breast, no skin changes, very slight tanning, pain in legs, physical therapy coming 2 weeks for her legs, dif walking, uses cane, legs lymphedema, uses radiaplex gel bid,  4:54 PM

## 2013-03-18 ENCOUNTER — Encounter: Payer: Self-pay | Admitting: Radiation Oncology

## 2013-03-18 ENCOUNTER — Ambulatory Visit
Admission: RE | Admit: 2013-03-18 | Discharge: 2013-03-18 | Disposition: A | Discharge status: Home / Self Care | Payer: Medicare Other | Source: Ambulatory Visit | Attending: Radiation Oncology | Admitting: Radiation Oncology

## 2013-03-18 ENCOUNTER — Encounter: Payer: Self-pay | Admitting: Specialist

## 2013-03-18 DIAGNOSIS — K5900 Constipation, unspecified: Secondary | ICD-10-CM | POA: Diagnosis not present

## 2013-03-18 DIAGNOSIS — F411 Generalized anxiety disorder: Secondary | ICD-10-CM | POA: Diagnosis not present

## 2013-03-18 DIAGNOSIS — C50919 Malignant neoplasm of unspecified site of unspecified female breast: Secondary | ICD-10-CM | POA: Diagnosis not present

## 2013-03-18 DIAGNOSIS — Z510 Encounter for antineoplastic radiation therapy: Secondary | ICD-10-CM | POA: Diagnosis not present

## 2013-03-18 DIAGNOSIS — R609 Edema, unspecified: Secondary | ICD-10-CM | POA: Diagnosis not present

## 2013-03-18 DIAGNOSIS — J449 Chronic obstructive pulmonary disease, unspecified: Secondary | ICD-10-CM | POA: Diagnosis not present

## 2013-03-18 NOTE — Progress Notes (Signed)
Individual counseling session with client on 6/30 at 3:30  Lenoard Aden [facility] : [facility] Counseling Intern

## 2013-03-18 NOTE — Progress Notes (Signed)
   Weekly Management Note:  outpatient Current Dose:  14 Gy  Projected Dose: 60 Gy   Narrative:  The patient presents for routine under treatment assessment.  CBCT/MVCT images/Port film x-rays were reviewed.  The chart was checked. She is still struggling with LE swelling - saw Dr. Welton Flakes, is continuing Lasix, and doing PT.  Receiving counseling in [facility] as well.  Physical Findings:  weight is 209 lb 8 oz (95.029 kg). Her oral temperature is 99 F (37.2 C). Her blood pressure is 103/72 and her pulse is 108. Her respiration is 20.  Minimal skin irritation thus far over right breast.  Impression:  The patient is tolerating radiotherapy.  Plan:  Continue radiotherapy as planned.  ________________________________   Lonie Peak, M.D.

## 2013-03-19 ENCOUNTER — Other Ambulatory Visit: Payer: Self-pay | Admitting: Emergency Medicine

## 2013-03-19 ENCOUNTER — Ambulatory Visit
Admission: RE | Admit: 2013-03-19 | Discharge: 2013-03-19 | Disposition: A | Discharge status: Home / Self Care | Payer: Medicare Other | Source: Ambulatory Visit | Attending: Radiation Oncology | Admitting: Radiation Oncology

## 2013-03-19 DIAGNOSIS — J449 Chronic obstructive pulmonary disease, unspecified: Secondary | ICD-10-CM | POA: Diagnosis not present

## 2013-03-19 DIAGNOSIS — C50919 Malignant neoplasm of unspecified site of unspecified female breast: Secondary | ICD-10-CM | POA: Diagnosis not present

## 2013-03-19 DIAGNOSIS — R609 Edema, unspecified: Secondary | ICD-10-CM | POA: Diagnosis not present

## 2013-03-19 DIAGNOSIS — F411 Generalized anxiety disorder: Secondary | ICD-10-CM | POA: Diagnosis not present

## 2013-03-19 DIAGNOSIS — K5900 Constipation, unspecified: Secondary | ICD-10-CM | POA: Diagnosis not present

## 2013-03-19 DIAGNOSIS — Z510 Encounter for antineoplastic radiation therapy: Secondary | ICD-10-CM | POA: Diagnosis not present

## 2013-03-19 MED ORDER — FUROSEMIDE 20 MG PO TABS: 80.0000 mg | ORAL_TABLET | Freq: Two times a day (BID) | ORAL | Status: DC

## 2013-03-20 ENCOUNTER — Ambulatory Visit: Payer: Medicare Other | Admitting: Physical Therapy

## 2013-03-20 ENCOUNTER — Ambulatory Visit
Admission: RE | Admit: 2013-03-20 | Discharge: 2013-03-20 | Disposition: A | Discharge status: Home / Self Care | Payer: Medicare Other | Source: Ambulatory Visit | Attending: Radiation Oncology | Admitting: Radiation Oncology

## 2013-03-20 DIAGNOSIS — J449 Chronic obstructive pulmonary disease, unspecified: Secondary | ICD-10-CM | POA: Diagnosis not present

## 2013-03-20 DIAGNOSIS — C50919 Malignant neoplasm of unspecified site of unspecified female breast: Secondary | ICD-10-CM | POA: Diagnosis not present

## 2013-03-20 DIAGNOSIS — R609 Edema, unspecified: Secondary | ICD-10-CM | POA: Diagnosis not present

## 2013-03-20 DIAGNOSIS — Z510 Encounter for antineoplastic radiation therapy: Secondary | ICD-10-CM | POA: Diagnosis not present

## 2013-03-20 DIAGNOSIS — K5900 Constipation, unspecified: Secondary | ICD-10-CM | POA: Diagnosis not present

## 2013-03-20 DIAGNOSIS — F411 Generalized anxiety disorder: Secondary | ICD-10-CM | POA: Diagnosis not present

## 2013-03-20 MED ORDER — RADIAPLEXRX EX GEL
Freq: Once | CUTANEOUS | Status: AC
  Administered 2013-03-20: 17:00:00 via TOPICAL

## 2013-03-21 ENCOUNTER — Ambulatory Visit: Payer: Medicare Other

## 2013-03-24 ENCOUNTER — Ambulatory Visit
Admission: RE | Admit: 2013-03-24 | Discharge: 2013-03-24 | Disposition: A | Discharge status: Home / Self Care | Payer: Medicare Other | Source: Ambulatory Visit | Attending: Radiation Oncology | Admitting: Radiation Oncology

## 2013-03-24 ENCOUNTER — Encounter: Payer: Self-pay | Admitting: Radiation Oncology

## 2013-03-24 VITALS — BP 123/75 | HR 100 | Temp 97.9°F

## 2013-03-24 DIAGNOSIS — C50919 Malignant neoplasm of unspecified site of unspecified female breast: Secondary | ICD-10-CM | POA: Diagnosis not present

## 2013-03-24 DIAGNOSIS — C50911 Malignant neoplasm of unspecified site of right female breast: Secondary | ICD-10-CM

## 2013-03-24 DIAGNOSIS — Z510 Encounter for antineoplastic radiation therapy: Secondary | ICD-10-CM | POA: Diagnosis not present

## 2013-03-24 DIAGNOSIS — F411 Generalized anxiety disorder: Secondary | ICD-10-CM | POA: Diagnosis not present

## 2013-03-24 DIAGNOSIS — R609 Edema, unspecified: Secondary | ICD-10-CM | POA: Diagnosis not present

## 2013-03-24 DIAGNOSIS — C50411 Malignant neoplasm of upper-outer quadrant of right female breast: Secondary | ICD-10-CM

## 2013-03-24 DIAGNOSIS — J449 Chronic obstructive pulmonary disease, unspecified: Secondary | ICD-10-CM | POA: Diagnosis not present

## 2013-03-24 DIAGNOSIS — K5900 Constipation, unspecified: Secondary | ICD-10-CM | POA: Diagnosis not present

## 2013-03-24 MED ORDER — LORAZEPAM 0.5 MG PO TABS: ORAL_TABLET | ORAL | Status: DC

## 2013-03-24 NOTE — Progress Notes (Signed)
Alexandria [name] has received 12 fractions to her right breast.  Today she is crying and reporting that she is having so much anxiety and that Ativan is not helping.  Prior to moving to [facility] she was taking Ativan 1 mg po TID vs. 0.5 mg po prior to treatment.  She has severe pain in her legs when walking because they are very edematous and hard to touch. Her right leg is more edematous than the left and is "more painful" and firmer than the left leg.  She started Lasix 80 mg po BID on Saturday as ordered by Dr. Welton Flakes, but states she is not having any results, not even an increase in voiding volume.  She took 1 lasix on Saturday and 2 on Sunday, but she has not taken any Lasix today. She reports SOB to the point that she has resumed use of her Albuterol inhaler last pm.  She states her pulse ox last pm wast 88% on RA when checked on her pulse oximeter. Her O2 sat was 98% on RA while lying on the treatment table today.    Erythema noted in her right breast, but skin is soft and intact.

## 2013-03-25 ENCOUNTER — Ambulatory Visit
Admission: RE | Admit: 2013-03-25 | Discharge: 2013-03-25 | Disposition: A | Discharge status: Home / Self Care | Payer: Medicare Other | Source: Ambulatory Visit | Attending: Radiation Oncology | Admitting: Radiation Oncology

## 2013-03-25 DIAGNOSIS — R609 Edema, unspecified: Secondary | ICD-10-CM | POA: Diagnosis not present

## 2013-03-25 DIAGNOSIS — C50919 Malignant neoplasm of unspecified site of unspecified female breast: Secondary | ICD-10-CM | POA: Diagnosis not present

## 2013-03-25 DIAGNOSIS — F411 Generalized anxiety disorder: Secondary | ICD-10-CM | POA: Diagnosis not present

## 2013-03-25 DIAGNOSIS — Z510 Encounter for antineoplastic radiation therapy: Secondary | ICD-10-CM | POA: Diagnosis not present

## 2013-03-25 DIAGNOSIS — J449 Chronic obstructive pulmonary disease, unspecified: Secondary | ICD-10-CM | POA: Diagnosis not present

## 2013-03-25 DIAGNOSIS — K5900 Constipation, unspecified: Secondary | ICD-10-CM | POA: Diagnosis not present

## 2013-03-25 NOTE — Progress Notes (Signed)
   Weekly Management Note:  outpatient Current Dose:  24 Gy  Projected Dose: 60 Gy   Narrative:  The patient presents for routine under treatment assessment.  CBCT/MVCT images/Port film x-rays were reviewed.  The chart was checked. She is having anxiety attacks. 0.5mg  Ativan not strong enough. Persistent LE edema despite increase in Lasix. Sees new PCP later this month. Her counseling intern is leaving [facility] soon. Has COPD (I added this to medical history in [facility]) and lower pulse ox at home during anxiety attacks.  May need supplemental O2 at home at times - will discuss at PCP appt.  Physical Findings:  temperature is 97.9 F (36.6 C). Her blood pressure is 123/75 and her pulse is 100. Her oxygen saturation is 93%.   Modest erythema, right breast, skin intact  Impression:  The patient is tolerating radiotherapy.  Plan:  Continue radiotherapy as planned. Anxiety - not typical per pt - exacerbated since cancer diagnosis. Rx Ativan 1-2  0.5mg  tabs q8hrs prn. Will contact social work to consider more intense counseling options. PCP appointment pending.  ________________________________   Lonie Peak, M.D.

## 2013-03-26 ENCOUNTER — Other Ambulatory Visit: Payer: Self-pay | Admitting: Medical Oncology

## 2013-03-26 ENCOUNTER — Ambulatory Visit
Admission: RE | Admit: 2013-03-26 | Discharge: 2013-03-26 | Disposition: A | Discharge status: Home / Self Care | Payer: Medicare Other | Source: Ambulatory Visit | Attending: Radiation Oncology | Admitting: Radiation Oncology

## 2013-03-26 DIAGNOSIS — C50411 Malignant neoplasm of upper-outer quadrant of right female breast: Secondary | ICD-10-CM

## 2013-03-26 DIAGNOSIS — J449 Chronic obstructive pulmonary disease, unspecified: Secondary | ICD-10-CM | POA: Diagnosis not present

## 2013-03-26 DIAGNOSIS — C50919 Malignant neoplasm of unspecified site of unspecified female breast: Secondary | ICD-10-CM | POA: Diagnosis not present

## 2013-03-26 DIAGNOSIS — Z510 Encounter for antineoplastic radiation therapy: Secondary | ICD-10-CM | POA: Diagnosis not present

## 2013-03-26 DIAGNOSIS — F411 Generalized anxiety disorder: Secondary | ICD-10-CM | POA: Diagnosis not present

## 2013-03-26 DIAGNOSIS — R609 Edema, unspecified: Secondary | ICD-10-CM | POA: Diagnosis not present

## 2013-03-26 DIAGNOSIS — K5900 Constipation, unspecified: Secondary | ICD-10-CM | POA: Diagnosis not present

## 2013-03-27 ENCOUNTER — Encounter: Payer: Self-pay | Admitting: Radiation Oncology

## 2013-03-27 ENCOUNTER — Ambulatory Visit
Admission: RE | Admit: 2013-03-27 | Discharge: 2013-03-27 | Disposition: A | Discharge status: Home / Self Care | Payer: Medicare Other | Source: Ambulatory Visit | Attending: Radiation Oncology | Admitting: Radiation Oncology

## 2013-03-27 ENCOUNTER — Other Ambulatory Visit (HOSPITAL_BASED_OUTPATIENT_CLINIC_OR_DEPARTMENT_OTHER): Payer: Medicare Other | Admitting: Lab

## 2013-03-27 VITALS — BP 133/91 | HR 102 | Temp 97.7°F | Resp 20 | Ht 68.0 in | Wt 213.8 lb

## 2013-03-27 DIAGNOSIS — C50411 Malignant neoplasm of upper-outer quadrant of right female breast: Secondary | ICD-10-CM

## 2013-03-27 DIAGNOSIS — R609 Edema, unspecified: Secondary | ICD-10-CM | POA: Diagnosis not present

## 2013-03-27 DIAGNOSIS — C50911 Malignant neoplasm of unspecified site of right female breast: Secondary | ICD-10-CM

## 2013-03-27 DIAGNOSIS — C50419 Malignant neoplasm of upper-outer quadrant of unspecified female breast: Secondary | ICD-10-CM | POA: Diagnosis not present

## 2013-03-27 DIAGNOSIS — Z510 Encounter for antineoplastic radiation therapy: Secondary | ICD-10-CM | POA: Diagnosis not present

## 2013-03-27 DIAGNOSIS — C50919 Malignant neoplasm of unspecified site of unspecified female breast: Secondary | ICD-10-CM | POA: Diagnosis not present

## 2013-03-27 DIAGNOSIS — K5900 Constipation, unspecified: Secondary | ICD-10-CM | POA: Diagnosis not present

## 2013-03-27 DIAGNOSIS — F411 Generalized anxiety disorder: Secondary | ICD-10-CM | POA: Diagnosis not present

## 2013-03-27 DIAGNOSIS — J449 Chronic obstructive pulmonary disease, unspecified: Secondary | ICD-10-CM | POA: Diagnosis not present

## 2013-03-27 LAB — CBC WITH DIFFERENTIAL/PLATELET
Eosinophils Absolute: 0.1 10*3/uL (ref 0.0–0.5)
HCT: 30.3 % — ABNORMAL LOW (ref 34.8–46.6)
HGB: 10 g/dL — ABNORMAL LOW (ref 11.6–15.9)
LYMPH%: 21 % (ref 14.0–49.7)
MONO#: 0.4 10*3/uL (ref 0.1–0.9)
NEUT#: 2.8 10*3/uL (ref 1.5–6.5)
NEUT%: 65.6 % (ref 38.4–76.8)
Platelets: 274 10*3/uL (ref 145–400)
RBC: 3.83 10*6/uL (ref 3.70–5.45)
WBC: 4.3 10*3/uL (ref 3.9–10.3)

## 2013-03-27 LAB — COMPREHENSIVE METABOLIC PANEL (CC13)
CO2: 32 mEq/L — ABNORMAL HIGH (ref 22–29)
Glucose: 100 mg/dl (ref 70–140)
Sodium: 142 mEq/L (ref 136–145)
Total Bilirubin: 0.22 mg/dL (ref 0.20–1.20)
Total Protein: 7.6 g/dL (ref 6.4–8.3)

## 2013-03-27 MED ORDER — LORAZEPAM 1 MG PO TABS
1.0000 mg | ORAL_TABLET | Freq: Once | ORAL | Status: AC
  Administered 2013-03-27: 1 mg via ORAL
  Filled 2013-03-27: qty 1

## 2013-03-27 NOTE — Progress Notes (Signed)
Alexandria [name] here prior to treatment due to "swelling and tightness" in her abdomen that she has experience x 4 days, in addition to epigastric pain. She is currently taking Dilaudid 4 mg at least 4 times on average.  She she is having small stools which is not her normal.  She normally takes Amitza BID and and 2 stool softners bid without any problems.    She "feels" that she is voiding "okay".  Her lower extremities are very firm and edematous and she is currently on Lasix 80 mg po daily for ~ 4-5 days.

## 2013-03-27 NOTE — Progress Notes (Signed)
Ativan 1 mg po given at 4:58pm as ordered by Dr. Dayton Scrape.  Ms Gallen took 1/2 mg while in an exam room, after she was assessed by Dr. Dayton Scrape, prior to her treatment.  She came back to nursing after her lab appointment stating that "she thinks" she may have misplaced her bottle which she  in her purse when she arrived to our clinic today.  Her purse and exam room 7 were searched ,but unable to find the bottle.  Went back to the [employer] and check at [employer] where she was checked in for lab and actually checked at the lab desk and searched the cubicle where her lab was drawn. CL Hickerson assisted with the search to no avail. She is accompanied by a friend who states she will look for the medication once she arrives home.  Ms. Kasal very tearful today and verbally expressing feelings of anxiety.

## 2013-03-27 NOTE — Progress Notes (Signed)
Weekly Management Note:  Site: Right breast Current Dose:  2800  cGy Projected Dose: 5000  cGy followed by right breast boost  Narrative: The patient is seen today for routine under treatment assessment. CBCT/MVCT images/port films were reviewed. The chart was reviewed.   She seen today prior to her treatment of for evaluation of bloating and abdominal discomfort. She has multiple medical problems and does plan to establish a relationship with Dr. Lesia Hausen on July 22. She tells me she has been on Dilantin for quite some time for leg, back, and worsening knee pain. She also has a history of lower extremity lymphedema and has seen physical therapy. She is on stool softeners but finds herself having small firm bowel movements 5-6 times a day. She has a history of having a hemicolectomy for benign disease. She is able to pass gas. No recent nausea or vomiting. She is quite anxious and does have a significant anxiety disorder.  Physical Examination:  Filed Vitals:   03/27/13 1543  BP: 133/91  Pulse: 102  Temp: 97.7 F (36.5 C)  Resp: 20  .  Weight: 213 lb 12.8 oz (96.979 kg). Chest: Lungs are clear. Abdomen: Slightly distended and tympanitic. Active bowel sounds. There is diffuse tenderness on palpation of the entire abdomen, more so inferiorly. Extremities: To 3+ lower extremity lymphedema.  Impression: Tolerating radiation therapy well, but she has some degree of constipation presumed secondary to her Dilantin. I do not feel that she is obstructed. I told her to be religious about her stool softeners, and she is a [employer] fund[employer] and seemingly has a good handle on her bowel medications.  Plan: Continue radiation therapy as planned. She may try to get into see Dr. Lesia Hausen earlier than July 22.

## 2013-03-28 ENCOUNTER — Ambulatory Visit
Admission: RE | Admit: 2013-03-28 | Discharge: 2013-03-28 | Disposition: A | Discharge status: Home / Self Care | Payer: Medicare Other | Source: Ambulatory Visit | Attending: Radiation Oncology | Admitting: Radiation Oncology

## 2013-03-28 ENCOUNTER — Other Ambulatory Visit: Payer: Self-pay | Admitting: Radiation Oncology

## 2013-03-28 DIAGNOSIS — Z510 Encounter for antineoplastic radiation therapy: Secondary | ICD-10-CM | POA: Diagnosis not present

## 2013-03-28 DIAGNOSIS — R609 Edema, unspecified: Secondary | ICD-10-CM | POA: Diagnosis not present

## 2013-03-28 DIAGNOSIS — C50411 Malignant neoplasm of upper-outer quadrant of right female breast: Secondary | ICD-10-CM

## 2013-03-28 DIAGNOSIS — J449 Chronic obstructive pulmonary disease, unspecified: Secondary | ICD-10-CM | POA: Diagnosis not present

## 2013-03-28 DIAGNOSIS — C50919 Malignant neoplasm of unspecified site of unspecified female breast: Secondary | ICD-10-CM | POA: Diagnosis not present

## 2013-03-28 DIAGNOSIS — F411 Generalized anxiety disorder: Secondary | ICD-10-CM | POA: Diagnosis not present

## 2013-03-28 DIAGNOSIS — K5900 Constipation, unspecified: Secondary | ICD-10-CM | POA: Diagnosis not present

## 2013-03-28 DIAGNOSIS — C50911 Malignant neoplasm of unspecified site of right female breast: Secondary | ICD-10-CM

## 2013-03-28 MED ORDER — LORAZEPAM 1 MG PO TABS
ORAL_TABLET | ORAL | Status: AC
  Filled 2013-03-28: qty 1

## 2013-03-28 MED ORDER — LORAZEPAM 0.5 MG PO TABS: ORAL_TABLET | ORAL | Status: DC

## 2013-03-28 MED ORDER — LORAZEPAM 1 MG PO TABS
1.0000 mg | ORAL_TABLET | Freq: Once | ORAL | Status: DC
  Filled 2013-03-28: qty 1

## 2013-03-31 ENCOUNTER — Ambulatory Visit: Payer: BC Managed Care – PPO | Admitting: Physical Therapy

## 2013-03-31 ENCOUNTER — Ambulatory Visit
Admission: RE | Admit: 2013-03-31 | Discharge: 2013-03-31 | Disposition: A | Discharge status: Home / Self Care | Payer: Medicare Other | Source: Ambulatory Visit | Attending: Radiation Oncology | Admitting: Radiation Oncology

## 2013-03-31 VITALS — BP 99/60 | HR 120 | Temp 98.2°F | Ht 68.0 in | Wt 207.7 lb

## 2013-03-31 DIAGNOSIS — K5900 Constipation, unspecified: Secondary | ICD-10-CM | POA: Diagnosis not present

## 2013-03-31 DIAGNOSIS — R609 Edema, unspecified: Secondary | ICD-10-CM | POA: Diagnosis not present

## 2013-03-31 DIAGNOSIS — C50919 Malignant neoplasm of unspecified site of unspecified female breast: Secondary | ICD-10-CM | POA: Diagnosis not present

## 2013-03-31 DIAGNOSIS — Z510 Encounter for antineoplastic radiation therapy: Secondary | ICD-10-CM | POA: Diagnosis not present

## 2013-03-31 DIAGNOSIS — C50411 Malignant neoplasm of upper-outer quadrant of right female breast: Secondary | ICD-10-CM

## 2013-03-31 DIAGNOSIS — J449 Chronic obstructive pulmonary disease, unspecified: Secondary | ICD-10-CM | POA: Diagnosis not present

## 2013-03-31 DIAGNOSIS — F411 Generalized anxiety disorder: Secondary | ICD-10-CM | POA: Diagnosis not present

## 2013-03-31 NOTE — Progress Notes (Signed)
Alexandria [name] here for weekly under treat visit.  She has had 17 fractions to her right breast.  She does have pain in her back and legs.  She has been taking for dilaudid and says that it is helping.  The skin on her breast is pink and she says it is sore especially under her breast.  She does have fatigue.  Last week she had lost the bottle of her ativan pills.  This weekend, they were sent to her in the mail by another patient who found them in the back hall.

## 2013-03-31 NOTE — Progress Notes (Signed)
Weekly Management Note:  Site: Right breast Current Dose:  3400  cGy Projected Dose: 5000  cGy  Narrative: The patient is seen today for routine under treatment assessment. CBCT/MVCT images/port films were reviewed. The chart was reviewed.   She is in better spirits today. She uses Radioplex gel. Her Ativan pills were mailed to her anonymously. So she did not fill a prescription as suggested by Dr. Basilio Cairo.  Physical Examination:  Filed Vitals:   03/31/13 1652  BP: 99/60  Pulse: 120  Temp: 98.2 F (36.8 C)  .  Weight: 207 lb 11.2 oz (94.212 kg). There is mild erythema the right breast, particularly the inframammary region. No areas of desquamation.  Impression: Tolerating radiation therapy well.  Plan: Continue radiation therapy as planned.

## 2013-04-01 ENCOUNTER — Ambulatory Visit
Admission: RE | Admit: 2013-04-01 | Discharge: 2013-04-01 | Disposition: A | Discharge status: Home / Self Care | Payer: Medicare Other | Source: Ambulatory Visit | Attending: Radiation Oncology | Admitting: Radiation Oncology

## 2013-04-01 DIAGNOSIS — F411 Generalized anxiety disorder: Secondary | ICD-10-CM | POA: Diagnosis not present

## 2013-04-01 DIAGNOSIS — K5900 Constipation, unspecified: Secondary | ICD-10-CM | POA: Diagnosis not present

## 2013-04-01 DIAGNOSIS — C50919 Malignant neoplasm of unspecified site of unspecified female breast: Secondary | ICD-10-CM | POA: Diagnosis not present

## 2013-04-01 DIAGNOSIS — Z510 Encounter for antineoplastic radiation therapy: Secondary | ICD-10-CM | POA: Diagnosis not present

## 2013-04-01 DIAGNOSIS — R609 Edema, unspecified: Secondary | ICD-10-CM | POA: Diagnosis not present

## 2013-04-01 DIAGNOSIS — J449 Chronic obstructive pulmonary disease, unspecified: Secondary | ICD-10-CM | POA: Diagnosis not present

## 2013-04-02 ENCOUNTER — Ambulatory Visit
Admission: RE | Admit: 2013-04-02 | Discharge: 2013-04-02 | Disposition: A | Discharge status: Home / Self Care | Payer: Medicare Other | Source: Ambulatory Visit | Attending: Radiation Oncology | Admitting: Radiation Oncology

## 2013-04-02 DIAGNOSIS — K5900 Constipation, unspecified: Secondary | ICD-10-CM | POA: Diagnosis not present

## 2013-04-02 DIAGNOSIS — F411 Generalized anxiety disorder: Secondary | ICD-10-CM | POA: Diagnosis not present

## 2013-04-02 DIAGNOSIS — C50919 Malignant neoplasm of unspecified site of unspecified female breast: Secondary | ICD-10-CM | POA: Diagnosis not present

## 2013-04-02 DIAGNOSIS — Z510 Encounter for antineoplastic radiation therapy: Secondary | ICD-10-CM | POA: Diagnosis not present

## 2013-04-02 DIAGNOSIS — J449 Chronic obstructive pulmonary disease, unspecified: Secondary | ICD-10-CM | POA: Diagnosis not present

## 2013-04-02 DIAGNOSIS — R609 Edema, unspecified: Secondary | ICD-10-CM | POA: Diagnosis not present

## 2013-04-03 ENCOUNTER — Ambulatory Visit
Admission: RE | Admit: 2013-04-03 | Discharge: 2013-04-03 | Disposition: A | Discharge status: Home / Self Care | Payer: Medicare Other | Source: Ambulatory Visit | Attending: Radiation Oncology | Admitting: Radiation Oncology

## 2013-04-03 DIAGNOSIS — R609 Edema, unspecified: Secondary | ICD-10-CM | POA: Diagnosis not present

## 2013-04-03 DIAGNOSIS — J449 Chronic obstructive pulmonary disease, unspecified: Secondary | ICD-10-CM | POA: Diagnosis not present

## 2013-04-03 DIAGNOSIS — K5900 Constipation, unspecified: Secondary | ICD-10-CM | POA: Diagnosis not present

## 2013-04-03 DIAGNOSIS — Z510 Encounter for antineoplastic radiation therapy: Secondary | ICD-10-CM | POA: Diagnosis not present

## 2013-04-03 DIAGNOSIS — F411 Generalized anxiety disorder: Secondary | ICD-10-CM | POA: Diagnosis not present

## 2013-04-03 DIAGNOSIS — C50919 Malignant neoplasm of unspecified site of unspecified female breast: Secondary | ICD-10-CM | POA: Diagnosis not present

## 2013-04-03 DIAGNOSIS — Z79899 Other long term (current) drug therapy: Secondary | ICD-10-CM | POA: Diagnosis not present

## 2013-04-03 DIAGNOSIS — G894 Chronic pain syndrome: Secondary | ICD-10-CM | POA: Diagnosis not present

## 2013-04-03 DIAGNOSIS — IMO0001 Reserved for inherently not codable concepts without codable children: Secondary | ICD-10-CM | POA: Diagnosis not present

## 2013-04-03 DIAGNOSIS — G5790 Unspecified mononeuropathy of unspecified lower limb: Secondary | ICD-10-CM | POA: Diagnosis not present

## 2013-04-04 ENCOUNTER — Ambulatory Visit
Admission: RE | Admit: 2013-04-04 | Discharge: 2013-04-04 | Disposition: A | Discharge status: Home / Self Care | Payer: Medicare Other | Source: Ambulatory Visit | Attending: Radiation Oncology | Admitting: Radiation Oncology

## 2013-04-04 DIAGNOSIS — Z510 Encounter for antineoplastic radiation therapy: Secondary | ICD-10-CM | POA: Diagnosis not present

## 2013-04-04 DIAGNOSIS — J449 Chronic obstructive pulmonary disease, unspecified: Secondary | ICD-10-CM | POA: Diagnosis not present

## 2013-04-04 DIAGNOSIS — K5900 Constipation, unspecified: Secondary | ICD-10-CM | POA: Diagnosis not present

## 2013-04-04 DIAGNOSIS — F411 Generalized anxiety disorder: Secondary | ICD-10-CM | POA: Diagnosis not present

## 2013-04-04 DIAGNOSIS — R609 Edema, unspecified: Secondary | ICD-10-CM | POA: Diagnosis not present

## 2013-04-04 DIAGNOSIS — C50919 Malignant neoplasm of unspecified site of unspecified female breast: Secondary | ICD-10-CM | POA: Diagnosis not present

## 2013-04-07 ENCOUNTER — Encounter: Payer: Self-pay | Admitting: Radiation Oncology

## 2013-04-07 ENCOUNTER — Encounter: Payer: BC Managed Care – PPO | Admitting: Physical Therapy

## 2013-04-07 ENCOUNTER — Ambulatory Visit
Admission: RE | Admit: 2013-04-07 | Discharge: 2013-04-07 | Disposition: A | Discharge status: Home / Self Care | Payer: Medicare Other | Source: Ambulatory Visit | Attending: Radiation Oncology | Admitting: Radiation Oncology

## 2013-04-07 VITALS — BP 105/71 | HR 106 | Temp 98.3°F | Resp 22 | Wt 211.9 lb

## 2013-04-07 DIAGNOSIS — F411 Generalized anxiety disorder: Secondary | ICD-10-CM | POA: Diagnosis not present

## 2013-04-07 DIAGNOSIS — Z510 Encounter for antineoplastic radiation therapy: Secondary | ICD-10-CM | POA: Diagnosis not present

## 2013-04-07 DIAGNOSIS — J449 Chronic obstructive pulmonary disease, unspecified: Secondary | ICD-10-CM | POA: Diagnosis not present

## 2013-04-07 DIAGNOSIS — K5900 Constipation, unspecified: Secondary | ICD-10-CM | POA: Diagnosis not present

## 2013-04-07 DIAGNOSIS — R609 Edema, unspecified: Secondary | ICD-10-CM | POA: Diagnosis not present

## 2013-04-07 DIAGNOSIS — C50411 Malignant neoplasm of upper-outer quadrant of right female breast: Secondary | ICD-10-CM

## 2013-04-07 DIAGNOSIS — C50919 Malignant neoplasm of unspecified site of unspecified female breast: Secondary | ICD-10-CM | POA: Diagnosis not present

## 2013-04-07 NOTE — Progress Notes (Signed)
Hyperpigmentation of right breast without desquamation noted. Reports using radiaplex as directed. Reports difficulty sleeping last night and that her oxygen saturation wouldn't get above 88%. Inspiratory and expiratory wheezing noted. Patient denies need for supplemental oxygen therapy at this time. Patient denies pain. Reports fatigue.

## 2013-04-07 NOTE — Progress Notes (Signed)
   Weekly Management Note:  outpatient Current Dose:  44 Gy  Projected Dose: 60 Gy   Narrative:  The patient presents for routine under treatment assessment.  CBCT/MVCT images/Port film x-rays were reviewed.  The chart was checked. She is doing a little better  But still coping with anxiety, LE swelling, stress, SOB, wheezing. Sees PCP tomorrow for first time.  Physical Findings:  weight is 211 lb 14.4 oz (96.117 kg). Her oral temperature is 98.3 F (36.8 C). Her blood pressure is 105/71 and her pulse is 106. Her respiration is 22 and oxygen saturation is 94%.  skin intact with erythema over right breast  Impression:  The patient is tolerating radiotherapy.  Plan:  Continue radiotherapy as planned. Radiaplex recommended TID.  I am glad she is seeing her PCP tomorrow.  ________________________________   Lonie Peak, M.D.

## 2013-04-08 ENCOUNTER — Ambulatory Visit: Payer: Medicare Other

## 2013-04-08 ENCOUNTER — Ambulatory Visit (INDEPENDENT_AMBULATORY_CARE_PROVIDER_SITE_OTHER): Payer: BC Managed Care – PPO | Admitting: Internal Medicine

## 2013-04-08 ENCOUNTER — Encounter: Payer: Self-pay | Admitting: Internal Medicine

## 2013-04-08 ENCOUNTER — Ambulatory Visit
Admission: RE | Admit: 2013-04-08 | Discharge: 2013-04-08 | Disposition: A | Discharge status: Home / Self Care | Payer: Medicare Other | Source: Ambulatory Visit | Attending: Radiation Oncology | Admitting: Radiation Oncology

## 2013-04-08 VITALS — BP 150/90 | HR 105 | Temp 97.8°F | Resp 20 | Ht 64.0 in | Wt 215.0 lb

## 2013-04-08 DIAGNOSIS — Z0000 Encounter for general adult medical examination without abnormal findings: Secondary | ICD-10-CM

## 2013-04-08 DIAGNOSIS — K5900 Constipation, unspecified: Secondary | ICD-10-CM | POA: Diagnosis not present

## 2013-04-08 DIAGNOSIS — F4323 Adjustment disorder with mixed anxiety and depressed mood: Secondary | ICD-10-CM | POA: Diagnosis not present

## 2013-04-08 DIAGNOSIS — J449 Chronic obstructive pulmonary disease, unspecified: Secondary | ICD-10-CM | POA: Diagnosis not present

## 2013-04-08 DIAGNOSIS — R600 Localized edema: Secondary | ICD-10-CM

## 2013-04-08 DIAGNOSIS — R011 Cardiac murmur, unspecified: Secondary | ICD-10-CM

## 2013-04-08 DIAGNOSIS — I10 Essential (primary) hypertension: Secondary | ICD-10-CM | POA: Diagnosis not present

## 2013-04-08 DIAGNOSIS — C50919 Malignant neoplasm of unspecified site of unspecified female breast: Secondary | ICD-10-CM | POA: Diagnosis not present

## 2013-04-08 DIAGNOSIS — G8929 Other chronic pain: Secondary | ICD-10-CM | POA: Insufficient documentation

## 2013-04-08 DIAGNOSIS — Z510 Encounter for antineoplastic radiation therapy: Secondary | ICD-10-CM | POA: Diagnosis not present

## 2013-04-08 DIAGNOSIS — R609 Edema, unspecified: Secondary | ICD-10-CM

## 2013-04-08 DIAGNOSIS — F411 Generalized anxiety disorder: Secondary | ICD-10-CM | POA: Diagnosis not present

## 2013-04-08 DIAGNOSIS — M549 Dorsalgia, unspecified: Secondary | ICD-10-CM | POA: Diagnosis not present

## 2013-04-08 DIAGNOSIS — K5909 Other constipation: Secondary | ICD-10-CM

## 2013-04-08 MED ORDER — LISINOPRIL 20 MG PO TABS: 20.0000 mg | ORAL_TABLET | Freq: Every day | ORAL | Status: DC

## 2013-04-08 NOTE — Patient Instructions (Signed)
Limit your sodium (Salt) intake  DASH Diet The DASH diet stands for "Dietary Approaches to Stop Hypertension." It is a healthy eating plan that has been shown to reduce high blood pressure (hypertension) in as little as 14 days, while also possibly providing other significant health benefits. These other health benefits include reducing the risk of breast cancer after menopause and reducing the risk of type 2 diabetes, heart disease, colon cancer, and stroke. Health benefits also include weight loss and slowing kidney failure in patients with chronic kidney disease.  DIET GUIDELINES  Limit salt (sodium). Your diet should contain less than 1500 mg of sodium daily.  Limit refined or processed carbohydrates. Your diet should include mostly whole grains. Desserts and added sugars should be used sparingly.  Include small amounts of heart-healthy fats. These types of fats include nuts, oils, and tub margarine. Limit saturated and trans fats. These fats have been shown to be harmful in the body. CHOOSING FOODS  The following food groups are based on a 2000 calorie diet. See your Registered Dietitian for individual calorie needs. Grains and Grain Products (6 to 8 servings daily)  Eat More Often: Whole-wheat bread, brown rice, whole-grain or wheat pasta, quinoa, popcorn without added fat or salt (air popped).  Eat Less Often: White bread, white pasta, white rice, cornbread. Vegetables (4 to 5 servings daily)  Eat More Often: Fresh, frozen, and canned vegetables. Vegetables may be raw, steamed, roasted, or grilled with a minimal amount of fat.  Eat Less Often/Avoid: Creamed or fried vegetables. Vegetables in a cheese sauce. Fruit (4 to 5 servings daily)  Eat More Often: All fresh, canned (in natural juice), or frozen fruits. Dried fruits without added sugar. One hundred percent fruit juice ( cup [237 mL] daily).  Eat Less Often: Dried fruits with added sugar. Canned fruit in light or heavy  syrup. [employer], Fish, and Poultry (2 servings or less daily. One serving is 3 to 4 oz [85-114 g]).  Eat More Often: Ninety percent or leaner ground beef, tenderloin, sirloin. Round cuts of beef, chicken breast, [place] breast. All fish. Grill, bake, or broil your meat. Nothing should be fried.  Eat Less Often/Avoid: Fatty cuts of meat, [place], or chicken leg, thigh, or wing. Fried cuts of meat or fish. Dairy (2 to 3 servings)  Eat More Often: Low-fat or fat-free milk, low-fat plain or light yogurt, reduced-fat or part-skim cheese.  Eat Less Often/Avoid: Milk (whole, 2%).Whole milk yogurt. Full-fat cheeses. Nuts, Seeds, and Legumes (4 to 5 servings per week)  Eat More Often: All without added salt.  Eat Less Often/Avoid: Salted nuts and seeds, canned beans with added salt. Fats and Sweets (limited)  Eat More Often: Vegetable oils, tub margarines without trans fats, sugar-free gelatin. Mayonnaise and salad dressings.  Eat Less Often/Avoid: Coconut oils, palm oils, butter, stick margarine, cream, half and half, cookies, candy, pie. FOR MORE INFORMATION The Dash Diet Eating Plan: www.dashdiet.org Document Released: 08/24/2011 Document Revised: 11/27/2011 Document Reviewed: 08/24/2011 ExitCare Patient Information 2014 Exit[employer], [place]. 1.5 Gram Low Sodium Diet A 1.5 gram sodium diet restricts the amount of sodium in the diet to no more than 1.5 g or 1500 mg daily. The American Heart Association recommends Americans over the age of 65 to consume no more than 1500 mg of sodium each day to reduce the risk of developing high blood pressure. Research also shows that limiting sodium may reduce heart attack and stroke risk. Many foods contain sodium for flavor and sometimes as a  preservative. When the amount of sodium in a diet needs to be low, it is important to know what to look for when choosing foods and drinks. The following includes some information and guidelines to help make it easier for you  to adapt to a low sodium diet. QUICK TIPS  Do not add salt to food.  Avoid convenience items and fast food.  Choose unsalted snack foods.  Buy lower sodium products, often labeled as "lower sodium" or "no salt added."  Check food labels to learn how much sodium is in 1 serving.  When eating at a restaurant, ask that your food be prepared with less salt or none, if possible. READING FOOD LABELS FOR SODIUM INFORMATION The nutrition facts label is a good place to find how much sodium is in foods. Look for products with no more than 400 mg of sodium per serving. Remember that 1.5 g = 1500 mg. The food label may also list foods as:  Sodium-free: Less than 5 mg in a serving.  Very low sodium: 35 mg or less in a serving.  Low-sodium: 140 mg or less in a serving.  Light in sodium: 50% less sodium in a serving. For example, if a food that usually has 300 mg of sodium is changed to become light in sodium, it will have 150 mg of sodium.  Reduced sodium: 25% less sodium in a serving. For example, if a food that usually has 400 mg of sodium is changed to reduced sodium, it will have 300 mg of sodium. CHOOSING FOODS Grains  Avoid: Salted crackers and snack items. Some cereals, including instant hot cereals. Bread stuffing and biscuit mixes. Seasoned rice or pasta mixes.  Choose: Unsalted snack items. Low-sodium cereals, oats, puffed wheat and rice, shredded wheat. English muffins and bread. Pasta. Meats  Avoid: Salted, canned, smoked, spiced, pickled meats, including fish and poultry. Bacon, ham, sausage, cold cuts, hot dogs, anchovies.  Choose: Low-sodium canned tuna and salmon. Fresh or frozen meat, poultry, and fish. Dairy  Avoid: Processed cheese and spreads. Cottage cheese. Buttermilk and condensed milk. Regular cheese.  Choose: Milk. Low-sodium cottage cheese. Yogurt. Sour cream. Low-sodium cheese. Fruits and Vegetables  Avoid: Regular canned vegetables. Regular canned tomato  sauce and paste. Frozen vegetables in sauces. Olives. Rosita Fire. Relishes. Sauerkraut.  Choose: Low-sodium canned vegetables. Low-sodium tomato sauce and paste. Frozen or fresh vegetables. Fresh and frozen fruit. Condiments  Avoid: Canned and packaged gravies. Worcestershire sauce. Tartar sauce. Barbecue sauce. Soy sauce. Steak sauce. Ketchup. Onion, garlic, and table salt. Meat flavorings and tenderizers.  Choose: Fresh and dried herbs and spices. Low-sodium varieties of mustard and ketchup. Lemon juice. Tabasco sauce. Horseradish. SAMPLE 1.5 GRAM SODIUM MEAL PLAN Breakfast / Sodium (mg)  1 cup low-fat milk / 143 mg  1 whole-wheat English muffin / 240 mg  1 tbs heart-healthy margarine / 153 mg  1 hard-boiled egg / 139 mg  1 small orange / 0 mg Lunch / Sodium (mg)  1 cup raw carrots / 76 mg  2 tbs no salt added peanut butter / 5 mg  2 slices whole-wheat bread / 270 mg  1 tbs jelly / 6 mg   cup red grapes / 2 mg Dinner / Sodium (mg)  1 cup whole-wheat pasta / 2 mg  1 cup low-sodium tomato sauce / 73 mg  3 oz lean ground beef / 57 mg  1 small side salad (1 cup raw spinach leaves,  cup cucumber,  cup yellow bell pepper) with  1 tsp olive oil and 1 tsp red wine vinegar / 25 mg Snack / Sodium (mg)  1 container low-fat vanilla yogurt / 107 mg  3 graham cracker squares / 127 mg Nutrient Analysis  Calories: 1745  Protein: 75 g  Carbohydrate: 237 g  Fat: 57 g  Sodium: 1425 mg Document Released: 09/04/2005 Document Revised: 11/27/2011 Document Reviewed: 12/06/2009 ExitCare Patient Information 2014 [place], [place].

## 2013-04-08 NOTE — Progress Notes (Signed)
Subjective:    Patient ID: Alexandria [name], female    DOB: 01/12/1953, 62 y.o.   MRN: [id number]  HPI  62 year old patient who is seen today to establish with our practice. She has located from [place] and has been in the area for approximately 5 weeks. She is followed closely by oncology do to a recent diagnosis in January of this year of right breast cancer. She has completed chemotherapy and is in the process of radiation therapy. She has a long history of osteoarthritis the back and chronic low back pain. She has establish with the pain clinic. Past medical history  Lower extremity edema up 3 months duration History depression. She has been on Prozac in the past Peptic ulcer disease. Status post recurrent ulcer about one year ago. This was diagnosed by upper endoscopy Hypertension. 7 years duration Remote history of pulmonary embolism following a C-section about 30 years ago Anxiety disorder COPD Breast cancer  Past surgical history  Colonoscopy 2009 She has had a hysterectomy and C-section in the past. She has had an appendectomy tonsillectomy and multiple bowel surgeries. This apparently has included a right partial hemicolectomy do to chronic constipation. She's had surgery for recurrent adhesions  Family history both parents are deceased father died complications of alcoholic cirrhosis mother died in her late 87s of an apparent cardiac condition. She also a history of Parkinson's disease 4 brothers 4 sisters one brother died of ALS another died of required ammonia  Social history former are in. She has not worked in 4 years and has been on disability for 2 years due to chronic back pain Married. 3 grown sons Lifelong nonsmoker  Past Medical History  Diagnosis Date  . 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 Iron deficiency anemia   . Status post chemotherapy     4 cycles of  Taxotere and cytoxan  . Chronic back pain   . Hx of pulmonary embolus     During c-Section  . Neuropathy   . Obesity     Class 2  . COPD (chronic obstructive pulmonary disease)     History   Social History  . Marital Status: Married    Spouse Name: N/A    Number of Children: N/A  . Years of Education: N/A   Occupational History  . Not on file.   Social History Main Topics  . Smoking status: Never Smoker   . Smokeless tobacco: Never Used  . Alcohol Use: No  . Drug Use: No  . Sexually Active: Yes   Other Topics Concern  . Not on file   Social History Narrative  . No narrative on file    Past Surgical History  Procedure Laterality Date  . Cesarean section    . Right breast lumpectomy      Family History  Problem Relation Age of Onset  . Parkinson's disease Mother     Allergies  Allergen Reactions  . Contrast Media (Iodinated Diagnostic Agents) Anaphylaxis    Current Outpatient Prescriptions on File Prior to Visit  Medication Sig Dispense Refill  . ferrous sulfate 325 (65 FE) MG tablet Take 325 mg by mouth daily with breakfast.      . furosemide (LASIX) 80 MG tablet Take 80 mg by mouth 2 (two) times daily.      . hyaluronate sodium (RADIAPLEXRX) GEL Apply 1 application topically 2 (two) times  daily. 2nd tube given 03/20/13      . HYDROmorphone (DILAUDID) 4 MG tablet Take 4 mg by mouth every 3 (three) hours as needed for pain.      Marland Kitchen levalbuterol (XOPENEX HFA) 45 MCG/ACT inhaler Inhale 1-2 puffs into the lungs as needed for wheezing.      . lidocaine-prilocaine (EMLA) cream Apply 1 application topically as needed.      Marland Kitchen LORazepam (ATIVAN) 0.5 MG tablet Take 1-2 tablets up to every 8 hrs PRN  anxiety  45 tablet  0  . lubiprostone (AMITIZA) 24 MCG capsule Take 24 mcg by mouth 2 (two) times daily with a meal.      . morphine (MS CONTIN) 15 MG 12 hr tablet Take 60 mg by mouth 3 (three) times daily. Q 12 hours      . potassium chloride (KLOR-CON) 20 MEQ packet Take  20 mEq by mouth 2 (two) times daily.        No current facility-administered medications on file prior to visit.    BP 150/90  Pulse 105  Temp(Src) 97.8 F (36.6 C) (Oral)  Resp 20  Ht 5\' 4"  (1.626 m)  Wt 215 lb (97.523 kg)  BMI 36.89 kg/m2  SpO2 97%  LMP 01/30/2005      Review of Systems  Constitutional: Positive for fatigue. Negative for fever, appetite change and unexpected weight change.  HENT: Negative for hearing loss, ear pain, nosebleeds, congestion, sore throat, mouth sores, trouble swallowing, neck stiffness, dental problem, voice change, sinus pressure and tinnitus.   Eyes: Negative for photophobia, pain, redness and visual disturbance.  Respiratory: Positive for shortness of breath and wheezing. Negative for cough and chest tightness.   Cardiovascular: Positive for leg swelling. Negative for chest pain and palpitations.  Gastrointestinal: Positive for constipation. Negative for nausea, vomiting, abdominal pain, diarrhea, blood in stool, abdominal distention and rectal pain.  Genitourinary: Negative for dysuria, urgency, frequency, hematuria, flank pain, vaginal bleeding, vaginal discharge, difficulty urinating, genital sores, vaginal pain, menstrual problem and pelvic pain.  Musculoskeletal: Positive for back pain. Negative for arthralgias.  Skin: Negative for rash.  Neurological: Negative for dizziness, syncope, speech difficulty, weakness, light-headedness, numbness and headaches.  Hematological: Negative for adenopathy. Does not bruise/bleed easily.  Psychiatric/Behavioral: Positive for dysphoric mood. Negative for suicidal ideas, behavioral problems, self-injury and agitation. The patient is nervous/anxious.        Objective:   Physical Exam  Constitutional: She is oriented to person, place, and time. She appears well-developed and well-nourished.  Blood pressure 150/70  HENT:  Head: Normocephalic.  Right Ear: External ear normal.  Left Ear: External ear  normal.  Mouth/Throat: Oropharynx is clear and moist.  Eyes: Conjunctivae and EOM are normal. Pupils are equal, round, and reactive to light.  Neck: Normal range of motion. Neck supple. No thyromegaly present.  Cardiovascular: Normal rate, regular rhythm and intact distal pulses.   Murmur heard. Grade 3/ 6 systolic murmur at the primary aortic area  Pulmonary/Chest: Effort normal. She has wheezes.  Scattered faint wheezing. O2 saturation 97  Abdominal: Soft. Bowel sounds are normal. She exhibits no mass. There is no tenderness.  Musculoskeletal: Normal range of motion. She exhibits edema.  Prominent tight tense edema distal to both knees  Lymphadenopathy:    She has no cervical adenopathy.  Neurological: She is alert and oriented to person, place, and time.  Skin: Skin is warm and dry. No rash noted.  Psychiatric: She has a normal mood and affect. Her behavior is  normal.          Assessment & Plan:  Preventive health examination Systolic murmur. Rule out hemodynamically significant ANS Chronic lower extremity edema-  will increase furosemide to 120 mg daily in divided dosages Right breast cancer Chronic pain syndrome Anxiety depression.  Followup psychiatry;  this has been arranged through the cancer Center Hypertension. We'll add lisinopril 20 to her regimen.

## 2013-04-09 ENCOUNTER — Other Ambulatory Visit: Payer: Self-pay | Admitting: Medical Oncology

## 2013-04-09 ENCOUNTER — Ambulatory Visit
Admission: RE | Admit: 2013-04-09 | Discharge: 2013-04-09 | Disposition: A | Discharge status: Home / Self Care | Payer: Medicare Other | Source: Ambulatory Visit | Attending: Radiation Oncology | Admitting: Radiation Oncology

## 2013-04-09 ENCOUNTER — Telehealth: Payer: Self-pay | Admitting: Internal Medicine

## 2013-04-09 ENCOUNTER — Ambulatory Visit: Payer: Medicare Other

## 2013-04-09 DIAGNOSIS — C50411 Malignant neoplasm of upper-outer quadrant of right female breast: Secondary | ICD-10-CM

## 2013-04-09 DIAGNOSIS — C50919 Malignant neoplasm of unspecified site of unspecified female breast: Secondary | ICD-10-CM | POA: Diagnosis not present

## 2013-04-09 DIAGNOSIS — C50911 Malignant neoplasm of unspecified site of right female breast: Secondary | ICD-10-CM

## 2013-04-09 DIAGNOSIS — F411 Generalized anxiety disorder: Secondary | ICD-10-CM | POA: Diagnosis not present

## 2013-04-09 DIAGNOSIS — K5900 Constipation, unspecified: Secondary | ICD-10-CM | POA: Diagnosis not present

## 2013-04-09 DIAGNOSIS — R609 Edema, unspecified: Secondary | ICD-10-CM | POA: Diagnosis not present

## 2013-04-09 DIAGNOSIS — J449 Chronic obstructive pulmonary disease, unspecified: Secondary | ICD-10-CM | POA: Diagnosis not present

## 2013-04-09 DIAGNOSIS — Z510 Encounter for antineoplastic radiation therapy: Secondary | ICD-10-CM | POA: Diagnosis not present

## 2013-04-09 MED ORDER — LORAZEPAM 0.5 MG PO TABS: ORAL_TABLET | ORAL | Status: DC

## 2013-04-09 NOTE — Telephone Encounter (Signed)
PT called to request that Dr. Kirtland Bouchard prescribe her LORazepam (ATIVAN) 0.5 MG tablet. She states that she would like Dr. Kirtland Bouchard to take over this RX. Please assist.

## 2013-04-10 ENCOUNTER — Ambulatory Visit
Admission: RE | Admit: 2013-04-10 | Discharge: 2013-04-10 | Disposition: A | Discharge status: Home / Self Care | Payer: Medicare Other | Source: Ambulatory Visit | Attending: Radiation Oncology | Admitting: Radiation Oncology

## 2013-04-10 ENCOUNTER — Ambulatory Visit: Payer: Medicare Other

## 2013-04-10 NOTE — Telephone Encounter (Signed)
All of patient's psychotropic medications should be prescribed by her psychiatrist

## 2013-04-11 ENCOUNTER — Ambulatory Visit: Payer: Medicare Other

## 2013-04-11 ENCOUNTER — Ambulatory Visit
Admission: RE | Admit: 2013-04-11 | Discharge: 2013-04-11 | Disposition: A | Discharge status: Home / Self Care | Payer: Medicare Other | Source: Ambulatory Visit | Attending: Radiation Oncology | Admitting: Radiation Oncology

## 2013-04-11 ENCOUNTER — Telehealth: Payer: Self-pay | Admitting: Internal Medicine

## 2013-04-11 NOTE — Telephone Encounter (Signed)
Left message on voicemail to call office.  

## 2013-04-11 NOTE — Telephone Encounter (Signed)
Pt requesting to be contacted in regard to her medications

## 2013-04-11 NOTE — Telephone Encounter (Signed)
Left detailed message on voicemail that pt must contact Psychiatrist for medication refill per Dr. Amador Cunas.

## 2013-04-12 ENCOUNTER — Ambulatory Visit: Payer: Medicare Other

## 2013-04-13 ENCOUNTER — Inpatient Hospital Stay (HOSPITAL_COMMUNITY)
Admission: EM | Admit: 2013-04-13 | Discharge: 2013-04-20 | DRG: 189 | Disposition: A | Discharge status: Home / Self Care | Payer: Medicare Other | Attending: Internal Medicine | Admitting: Internal Medicine

## 2013-04-13 ENCOUNTER — Encounter (HOSPITAL_COMMUNITY): Payer: Self-pay

## 2013-04-13 ENCOUNTER — Emergency Department (HOSPITAL_COMMUNITY): Payer: Medicare Other

## 2013-04-13 DIAGNOSIS — E669 Obesity, unspecified: Secondary | ICD-10-CM | POA: Diagnosis present

## 2013-04-13 DIAGNOSIS — F410 Panic disorder [episodic paroxysmal anxiety] without agoraphobia: Secondary | ICD-10-CM | POA: Diagnosis present

## 2013-04-13 DIAGNOSIS — J9600 Acute respiratory failure, unspecified whether with hypoxia or hypercapnia: Secondary | ICD-10-CM | POA: Diagnosis not present

## 2013-04-13 DIAGNOSIS — R0602 Shortness of breath: Secondary | ICD-10-CM | POA: Diagnosis not present

## 2013-04-13 DIAGNOSIS — Z86711 Personal history of pulmonary embolism: Secondary | ICD-10-CM

## 2013-04-13 DIAGNOSIS — Y842 Radiological procedure and radiotherapy as the cause of abnormal reaction of the patient, or of later complication, without mention of misadventure at the time of the procedure: Secondary | ICD-10-CM | POA: Diagnosis present

## 2013-04-13 DIAGNOSIS — I498 Other specified cardiac arrhythmias: Secondary | ICD-10-CM | POA: Diagnosis not present

## 2013-04-13 DIAGNOSIS — C50919 Malignant neoplasm of unspecified site of unspecified female breast: Secondary | ICD-10-CM | POA: Diagnosis not present

## 2013-04-13 DIAGNOSIS — L988 Other specified disorders of the skin and subcutaneous tissue: Secondary | ICD-10-CM | POA: Diagnosis present

## 2013-04-13 DIAGNOSIS — F4323 Adjustment disorder with mixed anxiety and depressed mood: Secondary | ICD-10-CM

## 2013-04-13 DIAGNOSIS — N179 Acute kidney failure, unspecified: Secondary | ICD-10-CM | POA: Diagnosis present

## 2013-04-13 DIAGNOSIS — Z79899 Other long term (current) drug therapy: Secondary | ICD-10-CM | POA: Diagnosis not present

## 2013-04-13 DIAGNOSIS — I509 Heart failure, unspecified: Secondary | ICD-10-CM | POA: Diagnosis not present

## 2013-04-13 DIAGNOSIS — J383 Other diseases of vocal cords: Secondary | ICD-10-CM | POA: Diagnosis present

## 2013-04-13 DIAGNOSIS — J9620 Acute and chronic respiratory failure, unspecified whether with hypoxia or hypercapnia: Secondary | ICD-10-CM | POA: Diagnosis not present

## 2013-04-13 DIAGNOSIS — J449 Chronic obstructive pulmonary disease, unspecified: Secondary | ICD-10-CM | POA: Diagnosis present

## 2013-04-13 DIAGNOSIS — E43 Unspecified severe protein-calorie malnutrition: Secondary | ICD-10-CM | POA: Insufficient documentation

## 2013-04-13 DIAGNOSIS — R0989 Other specified symptoms and signs involving the circulatory and respiratory systems: Secondary | ICD-10-CM

## 2013-04-13 DIAGNOSIS — J4489 Other specified chronic obstructive pulmonary disease: Secondary | ICD-10-CM | POA: Diagnosis present

## 2013-04-13 DIAGNOSIS — G589 Mononeuropathy, unspecified: Secondary | ICD-10-CM | POA: Diagnosis present

## 2013-04-13 DIAGNOSIS — E876 Hypokalemia: Secondary | ICD-10-CM | POA: Diagnosis present

## 2013-04-13 DIAGNOSIS — R0609 Other forms of dyspnea: Secondary | ICD-10-CM | POA: Diagnosis not present

## 2013-04-13 DIAGNOSIS — R609 Edema, unspecified: Secondary | ICD-10-CM | POA: Diagnosis present

## 2013-04-13 DIAGNOSIS — R0902 Hypoxemia: Secondary | ICD-10-CM

## 2013-04-13 DIAGNOSIS — Z9221 Personal history of antineoplastic chemotherapy: Secondary | ICD-10-CM | POA: Diagnosis not present

## 2013-04-13 DIAGNOSIS — K219 Gastro-esophageal reflux disease without esophagitis: Secondary | ICD-10-CM | POA: Diagnosis present

## 2013-04-13 DIAGNOSIS — D509 Iron deficiency anemia, unspecified: Secondary | ICD-10-CM | POA: Diagnosis present

## 2013-04-13 DIAGNOSIS — I5033 Acute on chronic diastolic (congestive) heart failure: Secondary | ICD-10-CM | POA: Diagnosis present

## 2013-04-13 DIAGNOSIS — G8929 Other chronic pain: Secondary | ICD-10-CM | POA: Diagnosis present

## 2013-04-13 DIAGNOSIS — I471 Supraventricular tachycardia, unspecified: Secondary | ICD-10-CM | POA: Diagnosis present

## 2013-04-13 DIAGNOSIS — N289 Disorder of kidney and ureter, unspecified: Secondary | ICD-10-CM

## 2013-04-13 DIAGNOSIS — R601 Generalized edema: Secondary | ICD-10-CM | POA: Diagnosis present

## 2013-04-13 DIAGNOSIS — Z6833 Body mass index (BMI) 33.0-33.9, adult: Secondary | ICD-10-CM | POA: Diagnosis not present

## 2013-04-13 DIAGNOSIS — I959 Hypotension, unspecified: Secondary | ICD-10-CM | POA: Diagnosis present

## 2013-04-13 DIAGNOSIS — J9819 Other pulmonary collapse: Secondary | ICD-10-CM | POA: Diagnosis not present

## 2013-04-13 DIAGNOSIS — M549 Dorsalgia, unspecified: Secondary | ICD-10-CM | POA: Diagnosis present

## 2013-04-13 DIAGNOSIS — C50911 Malignant neoplasm of unspecified site of right female breast: Secondary | ICD-10-CM | POA: Diagnosis present

## 2013-04-13 DIAGNOSIS — R0600 Dyspnea, unspecified: Secondary | ICD-10-CM

## 2013-04-13 DIAGNOSIS — F411 Generalized anxiety disorder: Secondary | ICD-10-CM | POA: Diagnosis not present

## 2013-04-13 DIAGNOSIS — J9601 Acute respiratory failure with hypoxia: Secondary | ICD-10-CM

## 2013-04-13 DIAGNOSIS — C50419 Malignant neoplasm of upper-outer quadrant of unspecified female breast: Secondary | ICD-10-CM | POA: Diagnosis present

## 2013-04-13 LAB — URINALYSIS, ROUTINE W REFLEX MICROSCOPIC
Bilirubin Urine: NEGATIVE
Glucose, UA: NEGATIVE mg/dL
Hgb urine dipstick: NEGATIVE
Protein, ur: NEGATIVE mg/dL
Urobilinogen, UA: 0.2 mg/dL (ref 0.0–1.0)

## 2013-04-13 LAB — COMPREHENSIVE METABOLIC PANEL
ALT: 18 U/L (ref 0–35)
Alkaline Phosphatase: 120 U/L — ABNORMAL HIGH (ref 39–117)
CO2: 30 mEq/L (ref 19–32)
Chloride: 91 mEq/L — ABNORMAL LOW (ref 96–112)
GFR calc Af Amer: 40 mL/min — ABNORMAL LOW (ref >90)
GFR calc non Af Amer: 35 mL/min — ABNORMAL LOW (ref >90)
Glucose, Bld: 150 mg/dL — ABNORMAL HIGH (ref 70–99)
Potassium: 2.9 mEq/L — ABNORMAL LOW (ref 3.5–5.1)
Sodium: 132 mEq/L — ABNORMAL LOW (ref 135–145)
Total Bilirubin: 0.3 mg/dL (ref 0.3–1.2)

## 2013-04-13 LAB — CBC WITH DIFFERENTIAL/PLATELET
Eosinophils Relative: 1 % (ref 0–5)
Lymphocytes Relative: 10 % — ABNORMAL LOW (ref 12–46)
Lymphs Abs: 0.6 10*3/uL — ABNORMAL LOW (ref 0.7–4.0)
MCV: 77.6 fL — ABNORMAL LOW (ref 78.0–100.0)
Neutro Abs: 4.9 10*3/uL (ref 1.7–7.7)
Neutrophils Relative %: 82 % — ABNORMAL HIGH (ref 43–77)
Platelets: 270 10*3/uL (ref 150–400)
RBC: 3.52 MIL/uL — ABNORMAL LOW (ref 3.87–5.11)
WBC: 6 10*3/uL (ref 4.0–10.5)

## 2013-04-13 LAB — CG4 I-STAT (LACTIC ACID): Lactic Acid, Venous: 1.16 mmol/L (ref 0.5–2.2)

## 2013-04-13 MED ORDER — SODIUM CHLORIDE 0.9 % IV BOLUS (SEPSIS)
500.0000 mL | Freq: Once | INTRAVENOUS | Status: AC
  Administered 2013-04-13: 500 mL via INTRAVENOUS

## 2013-04-13 MED ORDER — LORAZEPAM 0.5 MG PO TABS
0.5000 mg | ORAL_TABLET | Freq: Three times a day (TID) | ORAL | Status: DC | PRN
  Administered 2013-04-13 – 2013-04-14 (×2): 0.5 mg via ORAL
  Filled 2013-04-13 (×2): qty 1

## 2013-04-13 MED ORDER — LORAZEPAM 2 MG/ML IJ SOLN
2.0000 mg | Freq: Once | INTRAMUSCULAR | Status: AC
  Administered 2013-04-13: 2 mg via INTRAVENOUS
  Filled 2013-04-13: qty 1

## 2013-04-13 MED ORDER — PREDNISONE 20 MG PO TABS
60.0000 mg | ORAL_TABLET | Freq: Once | ORAL | Status: AC
  Administered 2013-04-13: 60 mg via ORAL
  Filled 2013-04-13: qty 3

## 2013-04-13 MED ORDER — POTASSIUM CHLORIDE CRYS ER 20 MEQ PO TBCR
40.0000 meq | EXTENDED_RELEASE_TABLET | Freq: Once | ORAL | Status: AC
  Administered 2013-04-13: 40 meq via ORAL
  Filled 2013-04-13: qty 2

## 2013-04-13 MED ORDER — HEPARIN (PORCINE) IN NACL 100-0.45 UNIT/ML-% IJ SOLN
1350.0000 [IU]/h | INTRAMUSCULAR | Status: DC
  Administered 2013-04-13: 1350 [IU]/h via INTRAVENOUS
  Filled 2013-04-13 (×2): qty 250

## 2013-04-13 MED ORDER — IPRATROPIUM BROMIDE 0.02 % IN SOLN
0.5000 mg | Freq: Once | RESPIRATORY_TRACT | Status: AC
  Administered 2013-04-13: 0.5 mg via RESPIRATORY_TRACT
  Filled 2013-04-13: qty 2.5

## 2013-04-13 MED ORDER — ALBUTEROL SULFATE (5 MG/ML) 0.5% IN NEBU: 2.5000 mg | INHALATION_SOLUTION | RESPIRATORY_TRACT | Status: DC | PRN

## 2013-04-13 MED ORDER — LEVALBUTEROL TARTRATE 45 MCG/ACT IN AERO
1.0000 | INHALATION_SPRAY | RESPIRATORY_TRACT | Status: DC | PRN
  Filled 2013-04-13: qty 15

## 2013-04-13 MED ORDER — MORPHINE SULFATE ER 30 MG PO TBCR
30.0000 mg | EXTENDED_RELEASE_TABLET | Freq: Two times a day (BID) | ORAL | Status: DC
  Administered 2013-04-13 – 2013-04-20 (×14): 30 mg via ORAL
  Filled 2013-04-13 (×14): qty 1

## 2013-04-13 MED ORDER — SODIUM CHLORIDE 0.9 % IJ SOLN
3.0000 mL | Freq: Two times a day (BID) | INTRAMUSCULAR | Status: DC
  Administered 2013-04-17 – 2013-04-20 (×4): 3 mL via INTRAVENOUS

## 2013-04-13 MED ORDER — RADIAPLEXRX EX GEL: 1.0000 "application " | Freq: Two times a day (BID) | CUTANEOUS | Status: DC

## 2013-04-13 MED ORDER — LIDOCAINE-PRILOCAINE 2.5-2.5 % EX CREA: 1.0000 "application " | TOPICAL_CREAM | CUTANEOUS | Status: DC | PRN

## 2013-04-13 MED ORDER — HYDROMORPHONE HCL PF 1 MG/ML IJ SOLN
1.0000 mg | Freq: Once | INTRAMUSCULAR | Status: AC
  Administered 2013-04-13: 1 mg via INTRAVENOUS
  Filled 2013-04-13: qty 1

## 2013-04-13 MED ORDER — FUROSEMIDE 10 MG/ML IJ SOLN
160.0000 mg | Freq: Once | INTRAVENOUS | Status: AC
  Administered 2013-04-13: 160 mg via INTRAVENOUS
  Filled 2013-04-13: qty 16

## 2013-04-13 MED ORDER — SODIUM CHLORIDE 0.9 % IV SOLN
INTRAVENOUS | Status: DC
  Administered 2013-04-14: 08:00:00 via INTRAVENOUS

## 2013-04-13 MED ORDER — PREDNISONE 50 MG PO TABS
50.0000 mg | ORAL_TABLET | Freq: Every day | ORAL | Status: DC
  Filled 2013-04-13 (×2): qty 1

## 2013-04-13 MED ORDER — LORAZEPAM 2 MG/ML IJ SOLN
1.0000 mg | Freq: Once | INTRAMUSCULAR | Status: DC
  Filled 2013-04-13: qty 1

## 2013-04-13 MED ORDER — LUBIPROSTONE 24 MCG PO CAPS
24.0000 ug | ORAL_CAPSULE | Freq: Two times a day (BID) | ORAL | Status: DC
  Administered 2013-04-14 – 2013-04-20 (×13): 24 ug via ORAL
  Filled 2013-04-13 (×16): qty 1

## 2013-04-13 MED ORDER — ALBUTEROL SULFATE (5 MG/ML) 0.5% IN NEBU
5.0000 mg | INHALATION_SOLUTION | Freq: Once | RESPIRATORY_TRACT | Status: AC
  Administered 2013-04-13: 5 mg via RESPIRATORY_TRACT
  Filled 2013-04-13: qty 1

## 2013-04-13 MED ORDER — HEPARIN BOLUS VIA INFUSION
3500.0000 [IU] | Freq: Once | INTRAVENOUS | Status: AC
  Administered 2013-04-13: 3500 [IU] via INTRAVENOUS

## 2013-04-13 MED ORDER — ADULT MULTIVITAMIN W/MINERALS CH
1.0000 | ORAL_TABLET | Freq: Every morning | ORAL | Status: DC
  Administered 2013-04-14 – 2013-04-20 (×7): 1 via ORAL
  Filled 2013-04-13 (×7): qty 1

## 2013-04-13 MED ORDER — HYDROMORPHONE HCL 4 MG PO TABS
4.0000 mg | ORAL_TABLET | ORAL | Status: DC | PRN
  Administered 2013-04-14 – 2013-04-20 (×31): 4 mg via ORAL
  Filled 2013-04-13 (×33): qty 1

## 2013-04-13 NOTE — H&P (Addendum)
[facility] History and Physical  Alexandria [name] DOB: 01/12/1953 DOA: 04/13/2013  Referring physician: ED PCP: Alexandria Boga, MD   Chief Complaint: SOB  HPI: Alexandria [name] is a 62 y.o. [name] This 62 year old [name] has gained over 80 pounds in the last 3 months due to edema from chemotherapy for breast cancer, she has several weeks of gradually worsening painful bilateral lower leg edema, she has gradually worsening generalized weakness and fatigue and shortness of breath for the last several weeks as well the worsening shortness of breath for last few days with hypoxia today with room air pulse oximetry 83% since she came to the ED for evaluation, she is no fever or altered mental status no chest pain no cough no abdominal pain vomiting or diarrhea or bloody stools, she takes 80 mg Lasix twice daily already and she uses inhalers but they're not helping today for shortness of breath and wheezing and fluid retention.  In the ED she was initially given lasix for what was believed to be fluid overload and CHF, but unfortunately her CXR showed no pulmonary edema (just a large hiatal hernia previously known about), her breathing didn't really improve, and she just became hypotensive with this, so they had to instead start her on IVF.  BMP shows mild AKI with creatinine of 1.5 (1.0 baseline).  VQ scan ordered for AM, patient was empirically started on heparin gtt, and hospitalist has been asked to admit for presumed PE.  Review of Systems: 12 systems reviewed and otherwise negative.  Past Medical History  Diagnosis Date  . 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 Iron deficiency anemia   . Status post chemotherapy     4 cycles of Taxotere and cytoxan  . Chronic back pain   . Hx of pulmonary embolus     During c-Section  . Neuropathy   . Obesity     Class 2  . COPD  (chronic obstructive pulmonary disease)    Past Surgical History  Procedure Laterality Date  . Cesarean section    . Right breast lumpectomy     Social History:  reports that she has never smoked. She has never used smokeless tobacco. She reports that she does not drink alcohol or use illicit drugs.   Allergies  Allergen Reactions  . Contrast Media (Iodinated Diagnostic Agents) Anaphylaxis  . Prednisone Other (See Comments)    Pt gets very agitated when she takes high doses of steroids    Family History  Problem Relation Age of Onset  . Parkinson's disease Mother    Prior to Admission medications   Medication Sig Start Date End Date Taking? Authorizing Provider  furosemide (LASIX) 80 MG tablet Take 80 mg by mouth 2 (two) times daily. 03/20/13  Yes Victorino December, MD  hyaluronate sodium (RADIAPLEXRX) GEL Apply 1 application topically 2 (two) times daily.  03/20/13  Yes Lonie Peak, MD  HYDROmorphone (DILAUDID) 4 MG tablet Take 4 mg by mouth every 3 (three) hours as needed for pain.   Yes Historical Provider, MD  levalbuterol [facility] HFA) 45 MCG/ACT inhaler Inhale 1-2 puffs into the lungs as needed for wheezing or shortness of breath.    Yes Historical Provider, MD  lidocaine-prilocaine (EMLA) cream Apply 1 application topically as needed (port access).    Yes Historical Provider, MD  LORazepam (ATIVAN) 0.5 MG tablet Take 0.5 mg by  mouth every 8 (eight) hours as needed for anxiety.   Yes Historical Provider, MD  losartan-hydrochlorothiazide (HYZAAR) 100-25 MG per tablet Take 1 tablet by mouth every morning.    Yes Historical Provider, MD  lubiprostone (AMITIZA) 24 MCG capsule Take 24 mcg by mouth 2 (two) times daily with a meal.   Yes Historical Provider, MD  morphine (MS CONTIN) 30 MG 12 hr tablet Take 30 mg by mouth 2 (two) times daily.   Yes Historical Provider, MD  Multiple Vitamin (MULTIVITAMIN WITH MINERALS) TABS Take 1 tablet by mouth every morning.   Yes Historical Provider, MD   Multiple Vitamins-Minerals (HAIR/SKIN/NAILS PO) Take 1 tablet by mouth every morning.   Yes Historical Provider, MD  potassium chloride (KLOR-CON) 20 MEQ packet Take 20 mEq by mouth 2 (two) times daily.    Yes Historical Provider, MD   Physical Exam: Filed Vitals:   04/13/13 1902 04/13/13 1903 04/13/13 2004 04/13/13 2239  BP: 119/49     Pulse: 104     Temp: 97.6 F (36.4 C)     TempSrc: Oral     Resp: 20     Height:    5\' 4"  (1.626 m)  Weight:    97.523 kg (215 lb)  SpO2: 89% 100% 94%     General:  NAD, resting comfortably in bed Eyes: PEERLA EOMI ENT: mucous membranes moist Neck: supple w/o JVD Cardiovascular: RRR w/o MRG Respiratory: few B rhonchi, decreased BS bilaterally, no wheezes on my exam Abdomen: soft, nt, nd, bs+ Skin: moderately severe BLE edema with venous stasis dermatitis. Musculoskeletal: MAE, full ROM all 4 extremities Psychiatric: normal tone and affect Neurologic: AAOx3, grossly non-focal, mentation suprisingly good given hypoxia  Labs on Admission:  Basic Metabolic Panel:  Recent Labs Lab 04/13/13 2025  NA 132*  K 2.9*  CL 91*  CO2 30  GLUCOSE 150*  BUN 53*  CREATININE 1.59*  CALCIUM 9.4   Liver Function Tests:  Recent Labs Lab 04/13/13 2025  AST 23  ALT 18  ALKPHOS 120*  BILITOT 0.3  PROT 7.2  ALBUMIN 3.5   No results found for this basename: LIPASE, AMYLASE,  in the last 168 hours No results found for this basename: AMMONIA,  in the last 168 hours CBC:  Recent Labs Lab 04/13/13 2025  WBC 6.0  NEUTROABS 4.9  HGB 8.8*  HCT 27.3*  MCV 77.6*  PLT 270   Cardiac Enzymes: No results found for this basename: CKTOTAL, CKMB, CKMBINDEX, TROPONINI,  in the last 168 hours  BNP (last 3 results)  Recent Labs  04/13/13 1925  PROBNP 1218.0*   CBG: No results found for this basename: GLUCAP,  in the last 168 hours  Radiological Exams on Admission: Dg Chest 2 View  04/13/2013   *RADIOLOGY REPORT*  Clinical Data: Shortness of  breath.  CHEST - 2 VIEW  Comparison: CT of the chest on 10/02/2012.  Findings: Density at the right lung base which likely represents chronic scarring and atelectasis based on the prior CT.  Subtle early infiltrate cannot be excluded.  There is no evidence of pulmonary edema or pleural fluid. A large hiatal / paraesophageal hernia is again identified.  Heart size and mediastinal contours are within normal limits.  IMPRESSION: Right basilar density likely represents chronic scarring and atelectasis.  However, subtle infiltrate cannot be excluded.  There is a stable large hiatal / paraesophageal hernia.   Original Report Authenticated By: Irish Lack, M.D.    EKG: Independently reviewed.  Borderline Q  waves in inferior leads but unclear if these are old as no prior available for comparison.  Assessment/Plan Principal Problem:   Acute respiratory failure with hypoxia Active Problems:   Malignant neoplasm of upper-outer quadrant of [name] breast   Chronic back pain   Hypokalemia   AKI (acute kidney injury)   Anemia   1. Acute respiratory failure with hypoxia - Work up negative for PNA, initially was believed to be CHF but patient actually got no better with Lasix but became hypotensive, so she was given fluids.  Prednisone for wheezing (will continue this for the moment), VQ scan pending in AM for possible PE, and patient was started on empiric heparin gtt (continue this).  Satting in mid 90s on [place]. 2. Chronic back pain - continue home meds 3. AKI - possibly pre-renal, will gently hydrate and hold diuretics and ARB. 4. Anemia - chronic and close to her baseline, gets frequent transfusions for this.  Recheck CBC in AM. 5. Hypokalemia - got 40 meq PO in ED, recheck BMP in AM.    Code Status: Full Code (must indicate code status--if unknown or must be presumed, indicate so) Family Communication: No family in room (indicate person spoken with, if applicable, with phone number if by  telephone) Disposition Plan: Admit to inpatient (indicate anticipated LOS)  Time spent: 70 min  Jakaya Jacobowitz M. [facility] Pager [phone]  If 7PM-7AM, please contact night-coverage [email or web address] Password [email or web address] 04/13/2013, 11:23 PM

## 2013-04-13 NOTE — ED Notes (Signed)
Port accessed by Angelita Ingles RN

## 2013-04-13 NOTE — ED Notes (Signed)
MD at bedside. 

## 2013-04-13 NOTE — Progress Notes (Signed)
ANTICOAGULATION CONSULT NOTE - Initial Consult  Pharmacy Consult for Heparin Indication: pulmonary embolus  Allergies  Allergen Reactions  . Contrast Media (Iodinated Diagnostic Agents) Anaphylaxis  . Prednisone Other (See Comments)    Pt gets very agitated when she takes high doses of steroids    Patient Measurements: Height: 5\' 4"  (162.6 cm) Weight: 215 lb (97.523 kg) IBW/kg (Calculated) : 54.7 Heparin Dosing Weight:   Vital Signs: Temp: 97.6 F (36.4 C) (07/27 1902) Temp src: Oral (07/27 1902) BP: 119/49 mmHg (07/27 1902) Pulse Rate: 104 (07/27 1902)  Labs:  Recent Labs  04/13/13 2025  HGB 8.8*  HCT 27.3*  PLT 270  CREATININE 1.59*    Estimated Creatinine Clearance: 43.7 ml/min (by C-G formula based on Cr of 1.59).   Medical History: Past Medical History  Diagnosis Date  . 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 Iron deficiency anemia   . Status post chemotherapy     4 cycles of Taxotere and cytoxan  . Chronic back pain   . Hx of pulmonary embolus     During c-Section  . Neuropathy   . Obesity     Class 2  . COPD (chronic obstructive pulmonary disease)     Medications:  Infusions:  . sodium chloride    . heparin    . heparin    . sodium chloride      Assessment: Patient with PE in ED.  No oral AC noted on PTA medication list.    Goal of Therapy:  Heparin level 0.3-0.7 units/ml Monitor platelets by anticoagulation protocol: Yes   Plan:  Heparin bolus 3500 units iv x1 Heparin drip at 1350 units/hr Daily heparin level and CBC Next heparin level at  0800    Darlina Guys, Jacquenette Shone Crowford 04/13/2013,10:44 PM

## 2013-04-13 NOTE — ED Notes (Signed)
Respiratory called for breathing tx

## 2013-04-13 NOTE — Progress Notes (Signed)
Patient experienced anxiety and nausea with some vomiting post nebulizer therapy. She states albuterol makes her "physically sick" and she had forgotten to mention this previous to her breathing treatment. She also made mention that she uses xopenex at home for this same reason.

## 2013-04-13 NOTE — ED Notes (Signed)
Pt presents with shortness of breath that she says she has had on and off for the past month. Pt says the shortness of breath got worse today and with exertion, she found it even harder to breath. Pt says that she does have a hx of chronic bronchitis. Pt was 89% on RA per Wyatt Portela NT when she came back to the room. Pt on 2L of O2 at this time via [place].

## 2013-04-13 NOTE — ED Provider Notes (Signed)
CSN: [id number]     Arrival date & time 04/13/13  1848 History     First MD Initiated Contact with Patient 04/13/13 1911     Chief Complaint  Patient presents with  . Shortness of Breath   (Consider location/radiation/quality/duration/timing/severity/associated sxs/prior Treatment) HPI  This 62 year old female has gained over 80 pounds in the last 3 months due to edema from chemotherapy for breast cancer, she has several weeks of gradually worsening painful bilateral lower leg edema, she has gradually worsening generalized weakness and fatigue and shortness of breath for the last several weeks as well the worsening shortness of breath for last few days with hypoxia today with room air pulse oximetry 83% since she came to the ED for evaluation, she is no fever or altered mental status no chest pain no cough no abdominal pain vomiting or diarrhea or bloody stools, she takes 80 mg Lasix twice daily already and she uses inhalers but they're not helping today for shortness of breath and wheezing and fluid retention. Patient also has chronic anemia and has had multiple blood transfusions over the last 2 years. Past Medical History  Diagnosis Date  . 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 Iron deficiency anemia   . Status post chemotherapy     4 cycles of Taxotere and cytoxan  . Chronic back pain   . Hx of pulmonary embolus     During c-Section  . Neuropathy   . Obesity     Class 2  . COPD (chronic obstructive pulmonary disease)    Past Surgical History  Procedure Laterality Date  . Cesarean section    . Right breast lumpectomy     Family History  Problem Relation Age of Onset  . Parkinson's disease Mother    History  Substance Use Topics  . Smoking status: Never Smoker   . Smokeless tobacco: Never Used  . Alcohol Use: No   OB History   Grav Para Term Preterm Abortions TAB  SAB Ect Mult Living                 Review of Systems 10 Systems reviewed and are negative for acute change except as noted in the HPI. Allergies  Contrast media; Albuterol; and Prednisone  Home Medications   No current outpatient prescriptions on file. BP 133/70  Pulse 97  Temp(Src) 98.2 F (36.8 C) (Oral)  Resp 18  Ht 5\' 7"  (1.702 m)  Wt 210 lb 15.7 oz (95.7 kg)  BMI 33.04 kg/m2  SpO2 96%  LMP 01/30/2005 Physical Exam  Nursing note and vitals reviewed. Constitutional:  Awake, alert, nontoxic appearance.  HENT:  Head: Atraumatic.  Eyes: Right eye exhibits no discharge. Left eye exhibits no discharge.  Neck: Neck supple.  Cardiovascular: Normal rate and regular rhythm.   No murmur heard. Pulmonary/Chest: She is in respiratory distress. She has wheezes. She has rales. She exhibits no tenderness.  Decreased breath sounds bilaterally with scattered inspiratory and expiratory wheezes with bibasilar crackles with tachypnea without accessory muscle usage or retractions, she is able to speak short sentences at rest with hypoxia on room air at 83-89%  Abdominal: Soft. There is no tenderness. There is no rebound.  Musculoskeletal: She exhibits edema and tenderness.  Baseline ROM, no obvious new focal weakness. Moderately severe edema both legs from her feet to her pelvis with stasis dermatitis without evidence of  cellulitis noted she has capillary refill less than 2 seconds in both feet with baseline neuropathy with decreased light touch in both feet with baseline movement of both legs  Neurological: She is alert.  Mental status and motor strength appears baseline for patient and situation.  Skin: No rash noted.  Psychiatric: She has a normal mood and affect.    ED Course  ECG: Sinus rhythm, ventricular rate 99, normal axis, borderline prolonged QT interval at 488 ms, no acute ischemic changes noted no comparison ECG immediately available  Patient informed of clinical course,  understand medical decision-making process, and agree with plan. Pt stable in ED with no significant deterioration in condition.  D/w [facility] who will see Pt in ED for admit; heparin ordered for possible PE, no NM imaging available tonight and Pt can't have CTA chest.  Procedures (including critical care time)  Labs Reviewed  CBC WITH DIFFERENTIAL - Abnormal; Notable for the following:    RBC 3.52 (*)    Hemoglobin 8.8 (*)    HCT 27.3 (*)    MCV 77.6 (*)    MCH 25.0 (*)    RDW 17.4 (*)    Neutrophils Relative % 82 (*)    Lymphocytes Relative 10 (*)    Lymphs Abs 0.6 (*)    All other components within normal limits  COMPREHENSIVE METABOLIC PANEL - Abnormal; Notable for the following:    Sodium 132 (*)    Potassium 2.9 (*)    Chloride 91 (*)    Glucose, Bld 150 (*)    BUN 53 (*)    Creatinine, Ser 1.59 (*)    Alkaline Phosphatase 120 (*)    GFR calc non Af Amer 35 (*)    GFR calc Af Amer 40 (*)    All other components within normal limits  PRO B NATRIURETIC PEPTIDE - Abnormal; Notable for the following:    Pro B Natriuretic peptide (BNP) 1218.0 (*)    All other components within normal limits  CBC - Abnormal; Notable for the following:    RBC 3.84 (*)    Hemoglobin 9.4 (*)    HCT 29.7 (*)    MCV 77.3 (*)    MCH 24.5 (*)    RDW 17.4 (*)    All other components within normal limits  BASIC METABOLIC PANEL - Abnormal; Notable for the following:    Chloride 91 (*)    Glucose, Bld 144 (*)    BUN 50 (*)    Creatinine, Ser 1.24 (*)    GFR calc non Af Amer 47 (*)    GFR calc Af Amer 54 (*)    All other components within normal limits  D-DIMER, QUANTITATIVE - Abnormal; Notable for the following:    D-Dimer, Quant 0.93 (*)    All other components within normal limits  BASIC METABOLIC PANEL - Abnormal; Notable for the following:    Potassium 2.8 (*)    Chloride 95 (*)    CO2 36 (*)    Glucose, Bld 121 (*)    BUN 38 (*)    GFR calc non Af Amer 67 (*)    GFR calc Af Amer 78  (*)    All other components within normal limits  PRO B NATRIURETIC PEPTIDE - Abnormal; Notable for the following:    Pro B Natriuretic peptide (BNP) 825.2 (*)    All other components within normal limits  BASIC METABOLIC PANEL - Abnormal; Notable for the following:    Potassium 3.2 (*)  Chloride 92 (*)    CO2 36 (*)    Glucose, Bld 123 (*)    BUN 30 (*)    GFR calc non Af Amer 77 (*)    GFR calc Af Amer 90 (*)    All other components within normal limits  URINALYSIS, ROUTINE W REFLEX MICROSCOPIC  HEPARIN LEVEL (UNFRACTIONATED)  HEPARIN LEVEL (UNFRACTIONATED)  BASIC METABOLIC PANEL  CG4 I-STAT (LACTIC ACID)  POCT I-STAT TROPONIN I   Nm Pulmonary Perf And Vent  04/14/2013   *RADIOLOGY REPORT*  Clinical Data: Dyspnea, hypoxia, breast cancer  NM PULMONARY VENTILATION AND PERFUSION SCAN  Radiopharmaceutical: 5.5 mCi of Tc MAA and 44 mCi of Tc DTPA  Comparison: Chest x-ray 04/13/2013  Findings: Ventilation images shows no ventilation defects. Hiatal hernia.    No segmental perfusion defects are noted on perfusion images.  Chest again demonstrate no acute infiltrate or pulmonary edema. Large hiatal hernia is noted with air-fluid level.  Findings are low probability for pulmonary embolus.  IMPRESSION: Low probability for pulmonary embolus.   Original Report Authenticated By: Natasha Mead, M.D.   1. Dyspnea   2. Hypoxia   3. Renal insufficiency   4. Breast cancer, unspecified laterality   5. Peripheral edema   6. Acute respiratory failure with hypoxia   7. AKI (acute kidney injury)   8. Acute-on-chronic respiratory failure   9. Acute on chronic diastolic CHF (congestive heart failure)   10. Anxiety state, unspecified   11. Anasarca   12. Hypokalemia   13. Vocal cord dysfunction     MDM  The patient appears reasonably stabilized for admission considering the current resources, flow, and capabilities available in the ED at this time, and I doubt any other [facility] requiring further screening  and/or treatment in the ED prior to admission.  Hurman Horn, MD 04/15/13 2045

## 2013-04-13 NOTE — ED Notes (Signed)
Resp at bedside for breathing tx.

## 2013-04-14 ENCOUNTER — Encounter: Payer: BC Managed Care – PPO | Admitting: Physical Therapy

## 2013-04-14 ENCOUNTER — Ambulatory Visit
Admission: RE | Admit: 2013-04-14 | Discharge: 2013-04-14 | Disposition: A | Discharge status: Home / Self Care | Payer: Medicare Other | Source: Ambulatory Visit | Attending: Radiation Oncology | Admitting: Radiation Oncology

## 2013-04-14 ENCOUNTER — Ambulatory Visit: Payer: Medicare Other

## 2013-04-14 ENCOUNTER — Inpatient Hospital Stay (HOSPITAL_COMMUNITY): Payer: Medicare Other

## 2013-04-14 ENCOUNTER — Other Ambulatory Visit: Payer: Self-pay | Admitting: Radiation Oncology

## 2013-04-14 DIAGNOSIS — I509 Heart failure, unspecified: Secondary | ICD-10-CM

## 2013-04-14 DIAGNOSIS — I5033 Acute on chronic diastolic (congestive) heart failure: Secondary | ICD-10-CM | POA: Diagnosis present

## 2013-04-14 DIAGNOSIS — F411 Generalized anxiety disorder: Secondary | ICD-10-CM

## 2013-04-14 DIAGNOSIS — J9620 Acute and chronic respiratory failure, unspecified whether with hypoxia or hypercapnia: Principal | ICD-10-CM

## 2013-04-14 DIAGNOSIS — N179 Acute kidney failure, unspecified: Secondary | ICD-10-CM

## 2013-04-14 LAB — BASIC METABOLIC PANEL
BUN: 50 mg/dL — ABNORMAL HIGH (ref 6–23)
Calcium: 9.9 mg/dL (ref 8.4–10.5)
Creatinine, Ser: 1.24 mg/dL — ABNORMAL HIGH (ref 0.50–1.10)
GFR calc Af Amer: 54 mL/min — ABNORMAL LOW (ref >90)
GFR calc non Af Amer: 47 mL/min — ABNORMAL LOW (ref >90)

## 2013-04-14 LAB — CBC
HCT: 29.7 % — ABNORMAL LOW (ref 36.0–46.0)
MCV: 77.3 fL — ABNORMAL LOW (ref 78.0–100.0)
RDW: 17.4 % — ABNORMAL HIGH (ref 11.5–15.5)
WBC: 4 10*3/uL (ref 4.0–10.5)

## 2013-04-14 MED ORDER — ENOXAPARIN SODIUM 60 MG/0.6ML ~~LOC~~ SOLN
0.5000 mg/kg | SUBCUTANEOUS | Status: DC
  Administered 2013-04-14 – 2013-04-19 (×6): 45 mg via SUBCUTANEOUS
  Filled 2013-04-14 (×7): qty 0.6

## 2013-04-14 MED ORDER — FUROSEMIDE 10 MG/ML IJ SOLN
40.0000 mg | Freq: Once | INTRAMUSCULAR | Status: AC
  Administered 2013-04-14: 40 mg via INTRAVENOUS
  Filled 2013-04-14 (×2): qty 4

## 2013-04-14 MED ORDER — LORAZEPAM 1 MG PO TABS
1.0000 mg | ORAL_TABLET | Freq: Once | ORAL | Status: AC
  Administered 2013-04-14: 1 mg via ORAL
  Filled 2013-04-14: qty 1

## 2013-04-14 MED ORDER — TECHNETIUM TO 99M ALBUMIN AGGREGATED
5.5000 | Freq: Once | INTRAVENOUS | Status: AC | PRN
  Administered 2013-04-14: 6 via INTRAVENOUS

## 2013-04-14 MED ORDER — SODIUM CHLORIDE 0.9 % IJ SOLN
10.0000 mL | INTRAMUSCULAR | Status: DC | PRN
  Administered 2013-04-15 – 2013-04-18 (×3): 10 mL
  Administered 2013-04-19: 20 mL
  Administered 2013-04-20: 10 mL

## 2013-04-14 MED ORDER — FUROSEMIDE 10 MG/ML IJ SOLN
40.0000 mg | Freq: Two times a day (BID) | INTRAMUSCULAR | Status: DC
  Administered 2013-04-15 – 2013-04-17 (×5): 40 mg via INTRAVENOUS
  Filled 2013-04-14 (×8): qty 4

## 2013-04-14 MED ORDER — TECHNETIUM TC 99M DIETHYLENETRIAME-PENTAACETIC ACID
44.0000 | Freq: Once | INTRAVENOUS | Status: AC | PRN
  Administered 2013-04-14: 44 via INTRAVENOUS

## 2013-04-14 MED ORDER — LORAZEPAM 0.5 MG PO TABS
0.5000 mg | ORAL_TABLET | Freq: Three times a day (TID) | ORAL | Status: DC | PRN
  Administered 2013-04-14 – 2013-04-20 (×15): 1 mg via ORAL
  Filled 2013-04-14 (×7): qty 2
  Filled 2013-04-14: qty 1
  Filled 2013-04-14 (×5): qty 2
  Filled 2013-04-14: qty 1
  Filled 2013-04-14 (×5): qty 2

## 2013-04-14 NOTE — Progress Notes (Signed)
Patient having extreme anxiety. Dr. Waymon Amato notified and received order for one time order of 1 mg of ativan. Erskin Burnet RN

## 2013-04-14 NOTE — Progress Notes (Signed)
   Weekly Management Note:  Inpatient Current Dose: 54 Gy  Projected Dose: 60 Gy   Narrative:  The patient presents for routine under treatment assessment.  CBCT/MVCT images/Port film x-rays were reviewed.  The chart was checked. She was admitted for SOB, hypoxia.  Managing LE edema, fluid overload, ARF.  Appears suspicions for PE/CHF are low.  Physical Findings:  height is 5\' 7"  (1.702 m) and weight is 202 lb 3.2 oz (91.717 kg). Her oral temperature is 98.3 F (36.8 C). Her blood pressure is 143/93 and her pulse is 102. Her respiration is 19 and oxygen saturation is 96%.  Skin over right breast hyperpigmented with dryness.  Skin intact.  Impression:  The patient is tolerating radiotherapy.  Plan:  Continue radiotherapy as planned.  followup in 1 month. Continue workup for other medical issues as inpatient.  ________________________________   Lonie Peak, M.D.

## 2013-04-14 NOTE — Progress Notes (Signed)
[facility] PROGRESS NOTE  Alexandria [name] ZOX:[id number] DOB: 01/12/1953 DOA: 04/13/2013 PCP: Rogelia Boga, MD Medical Oncologist: Dr. Lonie Peak Radiation Oncologist: Dr. Drue Second.  Brief narrative 62 year old female, retired Insurance underwriter, breast cancer status post chemotherapy in [place], on radiation therapy, lifelong nonsmoker, anemia, chronic pain, obesity, documented COPD, history of PE, recently moved from [place] to the [place] area 4-5 weeks back, presented to the [facility] ED on 04/13/13 with complaints of weight gain, lower extremity swelling, generalized weakness, fatigue and dyspnea. She used to be on home oxygen 6 months back and then stopped by herself when she moved from [place] to [place]. She has a pulse ox at home and has been intermittently hypoxic in [place] and prior to this admission-83% in the ED. She denied chest pain or cough. In the ED, she received Lasix for fluid overload and became hypotensive which was resuscitated with IV AF. Patient admitted for further management.  Assessment/Plan: 1. Dyspnea/acute on chronic hypoxic respiratory failure: Etiology possibly multifactorial-acute on chronic diastolic CHF, COPD (lifelong nonsmoker-? Etiology) , anxiety & paraesophaeal hernia. VQ scan-low probability for PE. Heparin infusion which was started in the ED will be discontinued and switched to Lovenox DVT prophylaxis. DC IV fluids. Start low-dose IV Lasix 40 mg twice a day and monitor response. Echo: LVEF 65-70%. Continue when necessary Ativan at home dose. Patient states that she feels better than on admission. Clinically not suspicious for pneumonia. Patient intolerant of prednisone and refusing. No wheezing at this time and hence will discontinue prednisone. 2. Fluid overload/anasarca/possible acute on chronic diastolic CHF: Management per problem #1. 3. Anxiety disorder: Continue when necessary Ativan. 4. Chronic pain: Patient states that  she has not taken her MS Contin for to 3 days. For now continue home dose of MS Contin and when necessary Dilaudid. 5. Hypokalemia: Repleted. 6. Microcytic anemia: Stable. 7. Acute renal insufficiency: Creatinine better compared to last night. Monitor BMP while on IV Lasix. 8. Breast cancer: Continue radiation treatment. Outpatient followup with oncology.  Code Status: Full Family Communication: None Disposition Plan: Home when medically stable.   Consultants:  Radiation oncology  Procedures:  None  Antibiotics:  None   HPI/Subjective: Dyspnea better than last night. Anxious. Denies chest pain.  Objective: Filed Vitals:   04/14/13 0022 04/14/13 0035 04/14/13 0505 04/14/13 1531  BP:  131/73 133/74 143/93  Pulse:  104 102 102  Temp:  98.2 F (36.8 C) 99 F (37.2 C) 98.3 F (36.8 C)  TempSrc:  Oral Oral Oral  Resp:  16 20 19   Height: 5\' 7"  (1.702 m)     Weight: 91.717 kg (202 lb 3.2 oz)     SpO2:  96% 99% 96%    Intake/Output Summary (Last 24 hours) at 04/14/13 1808 Last data filed at 04/14/13 1531  Gross per 24 hour  Intake   1190 ml  Output   3901 ml  Net  -2711 ml   Filed Weights   04/13/13 2239 04/14/13 0022  Weight: 97.523 kg (215 lb) 91.717 kg (202 lb 3.2 oz)    Exam:   General exam: Obese. Anxious appearing. No obvious distress.  Respiratory system: Slightly reduced breath sounds in the bases with occasional basal crackles. Rest of lung fields are clear to auscultation. No increased work of breathing.  Cardiovascular system: S1 & S2 heard, RRR. No JVD, murmurs, gallops, clicks. 3+ pitting bilateral leg edema extending to groin and lower back.  Gastrointestinal system: Abdomen is nondistended, soft and nontender. Normal bowel  sounds heard.  Central nervous system: Alert and oriented. No focal neurological deficits.  Extremities: Symmetric 5 x 5 power.   Data Reviewed: Basic Metabolic Panel:  Recent Labs Lab 04/13/13 2025 04/14/13 0510   NA 132* 136  K 2.9* 3.6  CL 91* 91*  CO2 30 32  GLUCOSE 150* 144*  BUN 53* 50*  CREATININE 1.59* 1.24*  CALCIUM 9.4 9.9   Liver Function Tests:  Recent Labs Lab 04/13/13 2025  AST 23  ALT 18  ALKPHOS 120*  BILITOT 0.3  PROT 7.2  ALBUMIN 3.5   No results found for this basename: LIPASE, AMYLASE,  in the last 168 hours No results found for this basename: AMMONIA,  in the last 168 hours CBC:  Recent Labs Lab 04/13/13 2025 04/14/13 0510  WBC 6.0 4.0  NEUTROABS 4.9  --   HGB 8.8* 9.4*  HCT 27.3* 29.7*  MCV 77.6* 77.3*  PLT 270 287   Cardiac Enzymes: No results found for this basename: CKTOTAL, CKMB, CKMBINDEX, TROPONINI,  in the last 168 hours BNP (last 3 results)  Recent Labs  04/13/13 1925  PROBNP 1218.0*   CBG: No results found for this basename: GLUCAP,  in the last 168 hours  No results found for this or any previous visit (from the past 240 hour(s)).   Studies: Dg Chest 2 View  04/13/2013   *RADIOLOGY REPORT*  Clinical Data: Shortness of breath.  CHEST - 2 VIEW  Comparison: CT of the chest on 10/02/2012.  Findings: Density at the right lung base which likely represents chronic scarring and atelectasis based on the prior CT.  Subtle early infiltrate cannot be excluded.  There is no evidence of pulmonary edema or pleural fluid. A large hiatal / paraesophageal hernia is again identified.  Heart size and mediastinal contours are within normal limits.  IMPRESSION: Right basilar density likely represents chronic scarring and atelectasis.  However, subtle infiltrate cannot be excluded.  There is a stable large hiatal / paraesophageal hernia.   Original Report Authenticated By: Irish Lack, M.D.   Nm Pulmonary Perf And Vent  04/14/2013   *RADIOLOGY REPORT*  Clinical Data: Dyspnea, hypoxia, breast cancer  NM PULMONARY VENTILATION AND PERFUSION SCAN  Radiopharmaceutical: 5.5 mCi of Tc MAA and 44 mCi of Tc DTPA  Comparison: Chest x-ray 04/13/2013  Findings:  Ventilation images shows no ventilation defects. Hiatal hernia.    No segmental perfusion defects are noted on perfusion images.  Chest again demonstrate no acute infiltrate or pulmonary edema. Large hiatal hernia is noted with air-fluid level.  Findings are low probability for pulmonary embolus.  IMPRESSION: Low probability for pulmonary embolus.   Original Report Authenticated By: Natasha Mead, M.D.     Additional labs:   Scheduled Meds: . enoxaparin (LOVENOX) injection  0.5 mg/kg Subcutaneous Q24H  . [START ON 04/15/2013] furosemide  40 mg Intravenous BID  . lubiprostone  24 mcg Oral BID WC  . morphine  30 mg Oral BID  . multivitamin with minerals  1 tablet Oral q morning - 10a  . sodium chloride  3 mL Intravenous Q12H   Continuous Infusions:    Principal Problem:   Acute respiratory failure with hypoxia Active Problems:   Malignant neoplasm of upper-outer quadrant of female breast   Chronic back pain   Hypokalemia   AKI (acute kidney injury)   Anemia    Time spent: 50 minutes    [facility]  [facility] Pager [phone].   If 8PM-8AM, please contact night-coverage at [email or web address],  password [email or web address] 04/14/2013, 6:08 PM  LOS: 1 day

## 2013-04-14 NOTE — Progress Notes (Signed)
Notified [facility] that the patient was admitted to the unit last night so that she does not miss today's treatment. They will be here at 1600 for her. Erskin Burnet RN

## 2013-04-14 NOTE — Progress Notes (Signed)
[facility] Radiation Oncology Dept Therapy Treatment Record Phone [phone]   Radiation Therapy was administered to Alexandria [name] on: 04/14/2013  5:25 PM and was treatment # 2 out of a planned course of 5 treatments.

## 2013-04-14 NOTE — Progress Notes (Signed)
Echo Lab  2D Echocardiogram completed.  Ermias Tomeo L Oluwaseun Cremer, RDCS 04/14/2013 9:24 AM

## 2013-04-14 NOTE — Plan of Care (Signed)
Problem: Consults Goal: General Medical Patient Education See Patient Education Module for specific education. Outcome: Progressing Information packet given to patient to read regarding the floor routine with meds, safety and etc.  Problem: Phase I Progression Outcomes Goal: Voiding-avoid urinary catheter unless indicated Outcome: Progressing Pt voiding without difficulty at this time. Received large dose of lasix in the ED.  Will cont to monitor pt's output and intake.

## 2013-04-14 NOTE — Progress Notes (Addendum)
ANTICOAGULATION CONSULT NOTE - Follow Up Consult  Pharmacy Consult for Heparin Indication: pulmonary embolus  Allergies  Allergen Reactions  . Contrast Media (Iodinated Diagnostic Agents) Anaphylaxis  . Albuterol   . Prednisone Other (See Comments)    Pt gets very agitated when she takes high doses of steroids    Patient Measurements: Height: 5\' 7"  (170.2 cm) Weight: 202 lb 3.2 oz (91.717 kg) IBW/kg (Calculated) : 61.6 Heparin Dosing Weight: 81 kg  Vital Signs: Temp: 99 F (37.2 C) (07/28 0505) Temp src: Oral (07/28 0505) BP: 133/74 mmHg (07/28 0505) Pulse Rate: 102 (07/28 0505)  Labs:  Recent Labs  04/13/13 2025 04/14/13 0510 04/14/13 0755  HGB 8.8* 9.4*  --   HCT 27.3* 29.7*  --   PLT 270 287  --   HEPARINUNFRC  --   --  0.53  CREATININE 1.59* 1.24*  --     Estimated Creatinine Clearance: 57.5 ml/min (by C-G formula based on Cr of 1.24).   Assessment: 89 yoF with breast cancer presenting 7/27 with acute respiratory failure with hypoxia, initiated on IV heparin for presumed PE.  VQ scan pending this AM.   First heparin level therapeutic following bolus and infusion running at 1350 units/hr.  CBC low but improved: Hgb 9.4, Platelets 287 No bleeding/complications reported.  Goal of Therapy:  Heparin level 0.3-0.7 units/ml Monitor platelets by anticoagulation protocol: Yes   Plan:  1.  Continue IV Heparin at 1350 units/hr (13.5 mL/hr). 2.  Recheck heparin level at 1400 to confirm rate. 3.  Daily HL and CBC.  Clance Boll 04/14/2013,10:24 AM   Addendum: 04/14/2013 2:17 PM Repeat heparin level = 0.55 Assessment:  Heparin level therapeutic.  Today's VQ scan impression = low probability for PE.  D-dimer elevated.  No LE dopplers yet and patient c/o painful bilateral lower leg edema. Plan: Continue heparin at 1350 units/hr.  HL in AM.    Clance Boll, PharmD, BCPS Pager: [phone] 04/14/2013 2:21 PM

## 2013-04-15 ENCOUNTER — Ambulatory Visit: Payer: Medicare Other

## 2013-04-15 ENCOUNTER — Ambulatory Visit
Admission: RE | Admit: 2013-04-15 | Discharge: 2013-04-15 | Disposition: A | Discharge status: Home / Self Care | Payer: Medicare Other | Source: Ambulatory Visit | Attending: Radiation Oncology | Admitting: Radiation Oncology

## 2013-04-15 DIAGNOSIS — R601 Generalized edema: Secondary | ICD-10-CM | POA: Diagnosis present

## 2013-04-15 DIAGNOSIS — J383 Other diseases of vocal cords: Secondary | ICD-10-CM | POA: Diagnosis present

## 2013-04-15 DIAGNOSIS — R609 Edema, unspecified: Secondary | ICD-10-CM

## 2013-04-15 DIAGNOSIS — E876 Hypokalemia: Secondary | ICD-10-CM

## 2013-04-15 LAB — BASIC METABOLIC PANEL
BUN: 38 mg/dL — ABNORMAL HIGH (ref 6–23)
BUN: 30 mg/dL — ABNORMAL HIGH (ref 6–23)
Calcium: 9.1 mg/dL (ref 8.4–10.5)
Chloride: 92 mEq/L — ABNORMAL LOW (ref 96–112)
Creatinine, Ser: 0.92 mg/dL (ref 0.50–1.10)
GFR calc Af Amer: 90 mL/min — ABNORMAL LOW (ref >90)
GFR calc non Af Amer: 67 mL/min — ABNORMAL LOW (ref >90)
GFR calc non Af Amer: 77 mL/min — ABNORMAL LOW (ref >90)
Glucose, Bld: 121 mg/dL — ABNORMAL HIGH (ref 70–99)
Glucose, Bld: 123 mg/dL — ABNORMAL HIGH (ref 70–99)
Potassium: 2.8 mEq/L — ABNORMAL LOW (ref 3.5–5.1)
Potassium: 3.2 mEq/L — ABNORMAL LOW (ref 3.5–5.1)
Sodium: 136 mEq/L (ref 135–145)

## 2013-04-15 MED ORDER — POTASSIUM CHLORIDE CRYS ER 20 MEQ PO TBCR
40.0000 meq | EXTENDED_RELEASE_TABLET | ORAL | Status: AC
  Administered 2013-04-15 (×2): 40 meq via ORAL
  Filled 2013-04-15 (×2): qty 2

## 2013-04-15 MED ORDER — POTASSIUM CHLORIDE CRYS ER 20 MEQ PO TBCR
40.0000 meq | EXTENDED_RELEASE_TABLET | Freq: Two times a day (BID) | ORAL | Status: DC
  Administered 2013-04-16 – 2013-04-18 (×5): 40 meq via ORAL
  Filled 2013-04-15 (×6): qty 2

## 2013-04-15 MED ORDER — BACITRACIN-NEOMYCIN-POLYMYXIN OINTMENT TUBE
TOPICAL_OINTMENT | Freq: Two times a day (BID) | CUTANEOUS | Status: DC
  Administered 2013-04-15 – 2013-04-20 (×10): via TOPICAL
  Filled 2013-04-15: qty 15

## 2013-04-15 MED ORDER — PANTOPRAZOLE SODIUM 40 MG PO TBEC
40.0000 mg | DELAYED_RELEASE_TABLET | Freq: Every day | ORAL | Status: DC
  Administered 2013-04-15 – 2013-04-20 (×6): 40 mg via ORAL
  Filled 2013-04-15 (×7): qty 1

## 2013-04-15 NOTE — Progress Notes (Signed)
Patient asked that Dr. Karoline Caldwell be contacted about her radiation blisters to her Right breast. Told her I would call Dr. Waymon Amato as he is her hospitalist while she is here. Text paged Dr. Waymon Amato. Erskin Burnet RN

## 2013-04-15 NOTE — Progress Notes (Signed)
[facility] PROGRESS NOTE  Alexandria [name] ZOX:[id number] DOB: 01/12/1953 DOA: 04/13/2013 PCP: Rogelia Boga, MD Medical Oncologist: Dr. Lonie Peak Radiation Oncologist: Dr. Drue Second.  Brief narrative 62 year old female, retired Insurance underwriter, breast cancer status post chemotherapy in [place], on radiation therapy, lifelong nonsmoker, anemia, chronic pain, obesity, documented COPD, history of PE, recently moved from [place] to the [place] area 4-5 weeks back, presented to the [name] ED on 04/13/13 with complaints of weight gain, lower extremity swelling, generalized weakness, fatigue and dyspnea. She used to be on home oxygen 6 months back and then stopped by herself when she moved from [place] to [place]. She has a pulse ox at home and has been intermittently hypoxic in [place] and prior to this admission-83% in the ED. She denied chest pain or cough. In the ED, she received Lasix for fluid overload and became hypotensive which was resuscitated with IV AF. Patient admitted for further management.  Assessment/Plan: 1. Dyspnea/acute on chronic hypoxic respiratory failure: Etiology possibly multifactorial-vocal cord dysfunction,? acute on chronic diastolic CHF, ? Reactive airway disease (doubtful has never smoked and no history of allergies or asthma) , anxiety & paraesophaeal hernia. VQ scan-low probability for PE. Heparin infusion which was started in the ED was discontinued and switched to Lovenox DVT prophylaxis. Start low-dose IV Lasix 40 mg twice a day with good response. Echo: LVEF 65-70%. Continue when necessary Ativan at home dose. Clinically not suspicious for pneumonia. Patient intolerant of prednisone and refusing. No wheezing at this time and hence discontinued prednisone. Pulmonary consulted and input appreciated-continue diuresis and recommended inpatient psychiatry evaluation for anxiety which is a significant factor in her dyspnea. Psychiatry consultation  called. Dyspnea improving. 2. Fluid overload/anasarca/possible acute on chronic diastolic CHF: Management per problem #1. Improving. 3. Anxiety disorder: Continue when necessary Ativan. Psychiatric consultation requested. 4. Chronic pain: Patient states that she has not taken her MS Contin for to 3 days. For now continue home dose of MS Contin and when necessary Dilaudid. Patient follows with pain M.D. OP. 5. Hypokalemia: Replete as needed and follow BMP. 6. Microcytic anemia: Stable. 7. Acute renal insufficiency: resolved. Monitor while on Lasix. 8. Breast cancer: Continue radiation treatment. Outpatient followup with oncology. Discussed with Dr. Evonnie Pat triple antibiotic cream for radiation skin changes of breasts. She recommends inpatient psychiatry consultation.  Code Status: Full Family Communication: None Disposition Plan: Home when medically stable.   Consultants:  Radiation oncology  Psychiatry-pending  Pulmonology  Procedures:  None  Antibiotics:  None   HPI/Subjective: Dyspnea and leg swellings better.  Objective: Filed Vitals:   04/14/13 2048 04/15/13 0240 04/15/13 0508 04/15/13 1457  BP: 127/70 133/72 116/72 133/70  Pulse: 105 102 97 97  Temp: 98.3 F (36.8 C)  97.7 F (36.5 C) 98.2 F (36.8 C)  TempSrc: Oral  Oral Oral  Resp: 18  19 18   Height:      Weight:   95.7 kg (210 lb 15.7 oz)   SpO2: 98% 98% 100% 96%    Intake/Output Summary (Last 24 hours) at 04/15/13 1840 Last data filed at 04/15/13 1409  Gross per 24 hour  Intake    240 ml  Output   2650 ml  Net  -2410 ml   Filed Weights   04/13/13 2239 04/14/13 0022 04/15/13 0508  Weight: 97.523 kg (215 lb) 91.717 kg (202 lb 3.2 oz) 95.7 kg (210 lb 15.7 oz)    Exam:   General exam: Obese. No obvious distress.  Respiratory system: Slightly reduced breath  sounds in the bases with occasional basal crackles. Occasional rhonchi upper chest. Otherwise clear to auscultation. No increased  work of breathing.  Cardiovascular system: S1 & S2 heard, RRR. No JVD, murmurs, gallops, clicks. 3+ pitting bilateral leg edema extending to groin and lower back-seems slightly better than yesterday..  Gastrointestinal system: Abdomen is nondistended, soft and nontender. Normal bowel sounds heard.  Central nervous system: Alert and oriented. No focal neurological deficits.  Extremities: Symmetric 5 x 5 power.  Psychiatry: Anxious.   Data Reviewed: Basic Metabolic Panel:  Recent Labs Lab 04/13/13 2025 04/14/13 0510 04/15/13 0517  NA 132* 136 138  K 2.9* 3.6 2.8*  CL 91* 91* 95*  CO2 30 32 36*  GLUCOSE 150* 144* 121*  BUN 53* 50* 38*  CREATININE 1.59* 1.24* 0.92  CALCIUM 9.4 9.9 9.1   Liver Function Tests:  Recent Labs Lab 04/13/13 2025  AST 23  ALT 18  ALKPHOS 120*  BILITOT 0.3  PROT 7.2  ALBUMIN 3.5   No results found for this basename: LIPASE, AMYLASE,  in the last 168 hours No results found for this basename: AMMONIA,  in the last 168 hours CBC:  Recent Labs Lab 04/13/13 2025 04/14/13 0510  WBC 6.0 4.0  NEUTROABS 4.9  --   HGB 8.8* 9.4*  HCT 27.3* 29.7*  MCV 77.6* 77.3*  PLT 270 287   Cardiac Enzymes: No results found for this basename: CKTOTAL, CKMB, CKMBINDEX, TROPONINI,  in the last 168 hours BNP (last 3 results)  Recent Labs  04/13/13 1925 04/15/13 0517  PROBNP 1218.0* 825.2*   CBG: No results found for this basename: GLUCAP,  in the last 168 hours  No results found for this or any previous visit (from the past 240 hour(s)).   Studies: Dg Chest 2 View  04/13/2013   *RADIOLOGY REPORT*  Clinical Data: Shortness of breath.  CHEST - 2 VIEW  Comparison: CT of the chest on 10/02/2012.  Findings: Density at the right lung base which likely represents chronic scarring and atelectasis based on the prior CT.  Subtle early infiltrate cannot be excluded.  There is no evidence of pulmonary edema or pleural fluid. A large hiatal / paraesophageal  hernia is again identified.  Heart size and mediastinal contours are within normal limits.  IMPRESSION: Right basilar density likely represents chronic scarring and atelectasis.  However, subtle infiltrate cannot be excluded.  There is a stable large hiatal / paraesophageal hernia.   Original Report Authenticated By: Irish Lack, M.D.   Nm Pulmonary Perf And Vent  04/14/2013   *RADIOLOGY REPORT*  Clinical Data: Dyspnea, hypoxia, breast cancer  NM PULMONARY VENTILATION AND PERFUSION SCAN  Radiopharmaceutical: 5.5 mCi of Tc MAA and 44 mCi of Tc DTPA  Comparison: Chest x-ray 04/13/2013  Findings: Ventilation images shows no ventilation defects. Hiatal hernia.    No segmental perfusion defects are noted on perfusion images.  Chest again demonstrate no acute infiltrate or pulmonary edema. Large hiatal hernia is noted with air-fluid level.  Findings are low probability for pulmonary embolus.  IMPRESSION: Low probability for pulmonary embolus.   Original Report Authenticated By: Natasha Mead, M.D.     Additional labs:   Scheduled Meds: . enoxaparin (LOVENOX) injection  0.5 mg/kg Subcutaneous Q24H  . furosemide  40 mg Intravenous BID  . lubiprostone  24 mcg Oral BID WC  . morphine  30 mg Oral BID  . multivitamin with minerals  1 tablet Oral q morning - 10a  . neomycin-bacitracin-polymyxin   Topical  BID  . pantoprazole  40 mg Oral Daily  . sodium chloride  3 mL Intravenous Q12H   Continuous Infusions:    Principal Problem:   Acute-on-chronic respiratory failure Active Problems:   Malignant neoplasm of upper-outer quadrant of female breast   Chronic back pain   Hypokalemia   AKI (acute kidney injury)   Anemia   Acute on chronic diastolic CHF (congestive heart failure)   Anxiety state, unspecified    Time spent: 30 minutes    [facility]  [facility] Pager [phone].   If 8PM-8AM, please contact night-coverage at [email or web address], password [facility] 04/15/2013, 6:40 PM  LOS: 2 days

## 2013-04-15 NOTE — Progress Notes (Signed)
Noted during assessment that patient has area to both soles of feet right greater than left that is thick in texture but tender to touch. Located at the ball of the foot on th right is bruised . Applied alvelyn dsg to rt and left foot to protect and give cushion to area when standing. Will cont to monitor .

## 2013-04-15 NOTE — Consult Note (Signed)
PULMONARY  / CRITICAL CARE MEDICINE  Name: Alexandria [name] MRN: [id number] DOB: 01/12/1953    ADMISSION DATE:  04/13/2013 CONSULTATION DATE:  7/29  REFERRING MD :   Waymon Amato PRIMARY SERVICE:  [facility]  CHIEF COMPLAINT:  Hypoxia   BRIEF PATIENT DESCRIPTION:   62 yof retired Engineer, civil (consulting) from [place] s/p  Right lumpectomy, 4 cylces of Cytoxan and Taxotere May 2014 and currently undergoing XRT for right breast CA. Admitted 7/27 for 80 lb weight gain and hypoxia. PCCM asked to see to help explain this is setting of normal LV fxn, negative V/Q scan and CXR with out marked edema.  SIGNIFICANT EVENTS / STUDIES:  ECHO 7/28: EF 65-70%, nml wall motion. Pulmonary arteries poorly visualized  V/Q 7/27: low prob w/ no acute infiltrate or edema  LINES / TUBES:   CULTURES:   ANTIBIOTICS:   HISTORY OF PRESENT ILLNESS:     62 yof currently undergoing XRT for breast CA involving right breast, stage I grade 3 ( ER positive, PR negative, PR negative HER-2).   She was s/p lumpectomy Feb 2014, she completed last cycle of chemo May 2014 which consisted of 4 cylces of Cytoxan and Taxotere.  Admitted on 7/27 w cc: gradually progressive lower extremity edema and approx 80 lb wt gain over the last 3 mo. As well as worsening shortness of breath. She had apparently been placed on oxygen 6 mo. pta and had been told she had "chronic bronchitis" by numerous doctors ? Including a pulmonary specialist   . The pt has a home pulse ox machine and reported a pulse saturation in 80s in spite of escalating lasix doses by her PCP. She denied Cough, CP, palpations, did endorse wheezing and increased SOB. Her initial work up has included: CXR without marked edema,  ECHO, which showed NML EF, and V/Q scan which was normal. Treatment to date has included: oxygen and IV diuresis. Pulmonary has been asked to see on 7/29 as she is now s/p >4.5 liters of diuresis and still requires supplemental oxygen. We have been asked to help explain her hypoxia.    She has used saba in past with no benefit.  When asked specific questions re symptoms , she failed to answer a single question asked in a straightforward manner, tending to go off on tangents or answer questions with ambiguous medical terms or diagnoses and seemed perplexed  when asked the same question more than once for clarification.   No obvious daytime variabilty or excess mucus prod or lateralizing/ pleuritic  cp or chest tightness, subjective wheeze overt sinus or hb symptoms. No unusual exp hx or h/o childhood pna/ asthma or knowledge of premature birth.   Generally Sleeping ok without nocturnal  or early am exacerbation  of respiratory  c/o's or need for noct saba. Also denies any obvious fluctuation of symptoms with weather or environmental changes or other aggravating or alleviating factors except as outlined above   PAST MEDICAL HISTORY :  Past Medical History  Diagnosis Date  . 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 Iron deficiency anemia   . Status post chemotherapy     4 cycles of Taxotere and cytoxan  . Chronic back pain   . Hx of pulmonary embolus     During c-Section  . Neuropathy   . Obesity     Class 2  . COPD (chronic obstructive pulmonary  disease)    Past Surgical History  Procedure Laterality Date  . Cesarean section    . Right breast lumpectomy     Prior to Admission medications   Medication Sig Start Date End Date Taking? Authorizing Provider  furosemide (LASIX) 80 MG tablet Take 80 mg by mouth 2 (two) times daily. 03/20/13  Yes Victorino December, MD  hyaluronate sodium (RADIAPLEXRX) GEL Apply 1 application topically 2 (two) times daily.  03/20/13  Yes Lonie Peak, MD  HYDROmorphone (DILAUDID) 4 MG tablet Take 4 mg by mouth every 3 (three) hours as needed for pain.   Yes Historical Provider, MD  levalbuterol [facility] HFA) 45 MCG/ACT inhaler Inhale 1-2 puffs  into the lungs as needed for wheezing or shortness of breath.    Yes Historical Provider, MD  lidocaine-prilocaine (EMLA) cream Apply 1 application topically as needed (port access).    Yes Historical Provider, MD  LORazepam (ATIVAN) 0.5 MG tablet Take 0.5-1 mg by mouth every 8 (eight) hours as needed for anxiety.   Yes Historical Provider, MD  losartan-hydrochlorothiazide (HYZAAR) 100-25 MG per tablet Take 1 tablet by mouth every morning.    Yes Historical Provider, MD  lubiprostone (AMITIZA) 24 MCG capsule Take 24 mcg by mouth 2 (two) times daily with a meal.   Yes Historical Provider, MD  morphine (MS CONTIN) 30 MG 12 hr tablet Take 30 mg by mouth 2 (two) times daily.   Yes Historical Provider, MD  Multiple Vitamin (MULTIVITAMIN WITH MINERALS) TABS Take 1 tablet by mouth every morning.   Yes Historical Provider, MD  Multiple Vitamins-Minerals (HAIR/SKIN/NAILS PO) Take 1 tablet by mouth every morning.   Yes Historical Provider, MD  potassium chloride (KLOR-CON) 20 MEQ packet Take 20 mEq by mouth 2 (two) times daily.    Yes Historical Provider, MD   Allergies  Allergen Reactions  . Contrast Media (Iodinated Diagnostic Agents) Anaphylaxis  . Albuterol   . Prednisone Other (See Comments)    Pt gets very agitated when she takes high doses of steroids    FAMILY HISTORY:  Family History  Problem Relation Age of Onset  . Parkinson's disease Mother    SOCIAL HISTORY:  reports that she has never smoked. She has never used smokeless tobacco. She reports that she does not drink alcohol or use illicit drugs.  REVIEW OF SYSTEMS:   Constitutional: Negative for fever, chills, weight gain >80lbs, malaise/fatigue and diaphoresis.  HENT: Negative for hearing loss, ear pain, nosebleeds, congestion, sore throat, neck pain, tinnitus and ear discharge.   Eyes: Negative for blurred vision, double vision, photophobia, pain, discharge and redness.  Respiratory: Negative for cough, hemoptysis, sputum  production, shortness of breath, wheezing and stridor.   Cardiovascular: Negative for chest pain, palpitations, orthopnea, claudication, leg swelling and PND.  Gastrointestinal: Negative for heartburn, nausea, vomiting, abdominal pain, diarrhea, constipation, blood in stool and melena.  Genitourinary: Negative for dysuria, urgency, frequency, hematuria and flank pain.  Musculoskeletal: Negative for myalgias, back pain, joint pain and falls.  Skin: Negative for itching and rash.  Neurological: Negative for dizziness, tingling, tremors, sensory change, speech change, focal weakness, seizures, loss of consciousness, weakness and headaches. VERY ANXIOUS  Endo/Heme/Allergies: Negative for environmental allergies and polydipsia. Does not bruise/bleed easily.  SUBJECTIVE:  Very anxious but feels as though she is breathing easier   VITAL SIGNS: Temp:  [97.7 F (36.5 C)-98.3 F (36.8 C)] 97.7 F (36.5 C) (07/29 0508) Pulse Rate:  [97-105] 97 (07/29 0508) Resp:  [18-19] 19 (07/29 0508)  BP: (116-143)/(70-93) 116/72 mmHg (07/29 0508) SpO2:  [96 %-100 %] 100 % (07/29 0508) Weight:  [95.7 kg (210 lb 15.7 oz)] 95.7 kg (210 lb 15.7 oz) (07/29 0508) FIO2 Room air  PHYSICAL EXAMINATION: General:  Very anxious restless at times  Neuro:  Awake, and oriented  HEENT:  [place]. No JVD. Marked upper airway wheeze insp >> exp Cardiovascular:  rrr Lungs:  Clear no accessory muscle use  Abdomen:  Soft, + bowel sounds  Musculoskeletal:  Marked anasarca  Skin:  Right breast erythmic, ulcerated and leaking    Recent Labs Lab 04/13/13 2025 04/14/13 0510 04/15/13 0517  NA 132* 136 138  K 2.9* 3.6 2.8*  CL 91* 91* 95*  CO2 30 32 36*  BUN 53* 50* 38*  CREATININE 1.59* 1.24* 0.92  GLUCOSE 150* 144* 121*    Recent Labs Lab 04/13/13 2025 04/14/13 0510  HGB 8.8* 9.4*  HCT 27.3* 29.7*  WBC 6.0 4.0  PLT 270 287   Dg Chest 2 View  04/13/2013   *RADIOLOGY REPORT*  Clinical Data: Shortness of breath.   CHEST - 2 VIEW  Comparison: CT of the chest on 10/02/2012.  Findings: Density at the right lung base which likely represents chronic scarring and atelectasis based on the prior CT.  Subtle early infiltrate cannot be excluded.  There is no evidence of pulmonary edema or pleural fluid. A large hiatal / paraesophageal hernia is again identified.  Heart size and mediastinal contours are within normal limits.  IMPRESSION: Right basilar density likely represents chronic scarring and atelectasis.  However, subtle infiltrate cannot be excluded.  There is a stable large hiatal / paraesophageal hernia.   Original Report Authenticated By: Irish Lack, M.D.   Nm Pulmonary Perf And Vent  04/14/2013   *RADIOLOGY REPORT*  Clinical Data: Dyspnea, hypoxia, breast cancer  NM PULMONARY VENTILATION AND PERFUSION SCAN  Radiopharmaceutical: 5.5 mCi of Tc MAA and 44 mCi of Tc DTPA  Comparison: Chest x-ray 04/13/2013  Findings: Ventilation images shows no ventilation defects. Hiatal hernia.    No segmental perfusion defects are noted on perfusion images.  Chest again demonstrate no acute infiltrate or pulmonary edema. Large hiatal hernia is noted with air-fluid level.  Findings are low probability for pulmonary embolus.  IMPRESSION: Low probability for pulmonary embolus.   Original Report Authenticated By: Natasha Mead, M.D.  agree basilar atx   ASSESSMENT / PLAN:  1) acute Hypoxic respiratory (resolving w/ diuresis). Likely multifactorial. 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 Her ECHO is normal. V/Q is normal. Think that this is primarily the result of hypoventilation due to: anasarca (80lb wt gain since chemo), chronic pain,deconditioning and vocal cord dysfxn. She feels better w/ diuresis. Alternative differential dx include: radiation pneumonitis (currently CXR not  suggestive), reactive airway disease (doubtful as never smoked and did not have h/o allergies or asthma).  Recommendation  -continue aggressive diuresis -if dyspnea does not improve could consider PFTs -prob needs high res CT scan but will defer timing to Dr Sherene Sires -she needs in-patient psychiatric evaluation to assist with her anxiety. This is a significant factor in her dyspnea  4) marked anxiety  Recommendation: Needs psych eval  5) mild reflux Recommendation: ppi  6) breast CA Recommendation  Cancer docs following     Sandrea Hughs, MD [facility]  Healthcare Cell [phone] After 5:30 PM or weekends, call [phone]

## 2013-04-15 NOTE — Progress Notes (Signed)
Patient's husband was noted to be at front desk, quite upset and was very rude and abrupt with nursing staff. He was threatening to call Dr. Karoline Caldwell himself and demanding to know why his wife had waited 8 hours for gauze. Informed her husband that patient had not asked for gauze but that someone would bring some and that I had already contacted Dr. Waymon Amato about notifying Dr. Karoline Caldwell. Dr. Waymon Amato notified Dr. Karoline Caldwell and it has been decided that she will get BID neosporin starting at 2200 tonight. Will continue to monitor patient. Erskin Burnet RN

## 2013-04-15 NOTE — Progress Notes (Signed)
Patient requested that she be provided with literature on protonix before taking her 1800 dose. Gave [employer] print out and moved medication time to 2200 to allow patient time to evaluate if this is something she would like to take. Erskin Burnet RN

## 2013-04-16 ENCOUNTER — Ambulatory Visit
Admission: RE | Admit: 2013-04-16 | Discharge: 2013-04-16 | Disposition: A | Discharge status: Home / Self Care | Payer: Medicare Other | Source: Ambulatory Visit | Attending: Radiation Oncology | Admitting: Radiation Oncology

## 2013-04-16 ENCOUNTER — Ambulatory Visit: Payer: Medicare Other

## 2013-04-16 DIAGNOSIS — F4323 Adjustment disorder with mixed anxiety and depressed mood: Secondary | ICD-10-CM

## 2013-04-16 DIAGNOSIS — C50919 Malignant neoplasm of unspecified site of unspecified female breast: Secondary | ICD-10-CM

## 2013-04-16 DIAGNOSIS — F411 Generalized anxiety disorder: Secondary | ICD-10-CM

## 2013-04-16 LAB — BASIC METABOLIC PANEL WITH GFR
BUN: 27 mg/dL — ABNORMAL HIGH (ref 6–23)
CO2: 37 meq/L — ABNORMAL HIGH (ref 19–32)
Calcium: 9.5 mg/dL (ref 8.4–10.5)
Chloride: 97 meq/L (ref 96–112)
Creatinine, Ser: 0.85 mg/dL (ref 0.50–1.10)
GFR calc Af Amer: 86 mL/min — ABNORMAL LOW
GFR calc non Af Amer: 74 mL/min — ABNORMAL LOW
Glucose, Bld: 94 mg/dL (ref 70–99)
Potassium: 3.2 meq/L — ABNORMAL LOW (ref 3.5–5.1)
Sodium: 140 meq/L (ref 135–145)

## 2013-04-16 MED ORDER — POLYETHYLENE GLYCOL 3350 17 G PO PACK
17.0000 g | PACK | Freq: Every day | ORAL | Status: DC | PRN
  Filled 2013-04-16: qty 1

## 2013-04-16 NOTE — Progress Notes (Signed)
[facility] PROGRESS NOTE  Alexandria [name] ZOX:[id number] DOB: 01/12/1953 DOA: 04/13/2013 PCP: Rogelia Boga, MD Medical Oncologist: Dr.Khan Radiation Oncologist: Dr. Basilio Cairo  Brief narrative 63 year old female, retired ICU nurse, breast cancer status post chemotherapy in [place], on radiation therapy, lifelong nonsmoker, anemia, chronic pain, obesity, documented COPD, history of PE, recently moved from [place] to the [place] area 4-5 weeks back, presented to the [facility] ED on 04/13/13 with complaints of weight gain, lower extremity swelling, generalized weakness, fatigue and dyspnea. She used to be on home oxygen 6 months back and then stopped by herself when she moved from [place] to [place]. She has a pulse ox at home and has been intermittently hypoxic in [place] and prior to this admission-83% in the ED. She denied chest pain or cough. In the ED, she received Lasix for fluid overload and became hypotensive which was resuscitated with IVF. Patient admitted for further management.  Assessment/Plan: 1. Dyspnea/acute on chronic hypoxic respiratory failure:  - Etiology multifactorial -vocal cord dysfunction, h/o multiple intubations -Acute on chronic diastolic CHF -Anxiety  All of the above likely contributing along with obesity/deconditioning etc, ? Reflux -continue  IV Lasix 40 mg Q12, negative 6L and weights down 5lbs -Echo: LVEF 65-70%. -Appreciate Pulmonary consult  -psychiatry evaluation for anxiety which is a significant contributor to her dyspnea  2. Fluid overload/anasarca/possible acute on chronic diastolic CHF: Management per problem #1. Improving.  3. Anxiety disorder: Continue when necessary Ativan. Psychiatric consultation requested.  4. Chronic pain:  continue home dose of MS Contin and when necessary Dilaudid. Patient follows with pain M.D. OP.  5. Hypokalemia: Replete as needed and follow BMP.  6. Microcytic anemia: Stable.  7. Acute  renal insufficiency: resolved. Monitor while on Lasix.  8. Breast cancer: Stage 1, Continue radiation treatment. Outpatient followup with oncology. Discussed with Dr. Evonnie Pat triple antibiotic cream for radiation induced skin changes of breasts..  Ambulate, Pt/Ot eval  Code Status: Full Family Communication: None Disposition Plan: Home when medically stable.   Consultants:  Radiation oncology  Psychiatry-pending  Pulmonology  Procedures:  None  Antibiotics:  None   HPI/Subjective: Dyspnea and leg swellings better, just back from XRT.  Objective: Filed Vitals:   04/15/13 1457 04/15/13 2231 04/16/13 0620 04/16/13 1400  BP: 133/70 115/76 125/68   Pulse: 97 92 93   Temp: 98.2 F (36.8 C) 98.4 F (36.9 C) 98.1 F (36.7 C)   TempSrc: Oral Oral Oral   Resp: 18 20 16    Height:      Weight:      SpO2: 96% 97% 97% 98%    Intake/Output Summary (Last 24 hours) at 04/16/13 1441 Last data filed at 04/16/13 1100  Gross per 24 hour  Intake    300 ml  Output   1150 ml  Net   -850 ml   Filed Weights   04/13/13 2239 04/14/13 0022 04/15/13 0508  Weight: 97.523 kg (215 lb) 91.717 kg (202 lb 3.2 oz) 95.7 kg (210 lb 15.7 oz)    Exam:   General exam: Obese. No obvious distress, anxious  Respiratory system: Slightly reduced breath sounds in the bases with occasional basal crackles. upper airway wheeze noted  Cardiovascular system: S1 & S2 heard, RRR. No JVD, murmurs, gallops, clicks. 3+ pitting bilateral leg edema extending to groin and lower back-seems slightly better than yesterday..  Gastrointestinal system: Abdomen is nondistended, soft and nontender. Normal bowel sounds heard.  Central nervous system: Alert and oriented. No focal neurological deficits.  Extremities: Symmetric  5 x 5 power.  Psychiatry: Anxious.   Data Reviewed: Basic Metabolic Panel:  Recent Labs Lab 04/13/13 2025 04/14/13 0510 04/15/13 0517 04/15/13 1901 04/16/13 0410  NA  132* 136 138 136 140  K 2.9* 3.6 2.8* 3.2* 3.2*  CL 91* 91* 95* 92* 97  CO2 30 32 36* 36* 37*  GLUCOSE 150* 144* 121* 123* 94  BUN 53* 50* 38* 30* 27*  CREATININE 1.59* 1.24* 0.92 0.82 0.85  CALCIUM 9.4 9.9 9.1 9.7 9.5   Liver Function Tests:  Recent Labs Lab 04/13/13 2025  AST 23  ALT 18  ALKPHOS 120*  BILITOT 0.3  PROT 7.2  ALBUMIN 3.5   No results found for this basename: LIPASE, AMYLASE,  in the last 168 hours No results found for this basename: AMMONIA,  in the last 168 hours CBC:  Recent Labs Lab 04/13/13 2025 04/14/13 0510  WBC 6.0 4.0  NEUTROABS 4.9  --   HGB 8.8* 9.4*  HCT 27.3* 29.7*  MCV 77.6* 77.3*  PLT 270 287   Cardiac Enzymes: No results found for this basename: CKTOTAL, CKMB, CKMBINDEX, TROPONINI,  in the last 168 hours BNP (last 3 results)  Recent Labs  04/13/13 1925 04/15/13 0517  PROBNP 1218.0* 825.2*   CBG: No results found for this basename: GLUCAP,  in the last 168 hours  No results found for this or any previous visit (from the past 240 hour(s)).   Studies: No results found.   Additional labs:   Scheduled Meds: . enoxaparin (LOVENOX) injection  0.5 mg/kg Subcutaneous Q24H  . furosemide  40 mg Intravenous BID  . lubiprostone  24 mcg Oral BID WC  . morphine  30 mg Oral BID  . multivitamin with minerals  1 tablet Oral q morning - 10a  . neomycin-bacitracin-polymyxin   Topical BID  . pantoprazole  40 mg Oral Daily  . potassium chloride  40 mEq Oral BID  . sodium chloride  3 mL Intravenous Q12H   Continuous Infusions:    Principal Problem:   Acute-on-chronic respiratory failure Active Problems:   Malignant neoplasm of upper-outer quadrant of female breast   Chronic back pain   Hypokalemia   AKI (acute kidney injury)   Anemia   Acute on chronic diastolic CHF (congestive heart failure)   Anxiety state, unspecified   Anasarca   Vocal cord dysfunction    Time spent: 30 minutes    [facility]  [facility] Pager [phone].   If 8PM-8AM, please contact night-coverage at [email or web address], password [facility] 04/16/2013, 2:41 PM  LOS: 3 days

## 2013-04-16 NOTE — Progress Notes (Signed)
Clinical Social Work Department CLINICAL SOCIAL WORK PSYCHIATRY SERVICE LINE ASSESSMENT 04/16/2013  Patient:  Alexandria [name]  Account:  [id number]  Admit Date:  04/13/2013  Clinical Social Worker:  Unk Lightning, LCSW  Date/Time:  04/16/2013 11:30 AM Referred by:  Physician  Date referred:  04/16/2013 Reason for Referral  Psychosocial assessment   Presenting Symptoms/Problems (In the person's/family's own words):   Psych consulted to assess patient's anxiety.   Abuse/Neglect/Trauma History (check all that apply)  Denies history   Abuse/Neglect/Trauma Comments:   Psychiatric History (check all that apply)  Outpatient treatment   Psychiatric medications:  Ativan 1 mg   Current Mental Health Hospitalizations/Previous Mental Health History:   Patient reports no MH concerns prior to breast cancer diagnosis. Patient reports she feels that depression and cancer diagnosis are related but feels that anxiety is her main concern.   Current provider:   None   Place and Date:   N/A   Current Medications:   albuterol, HYDROmorphone, levalbuterol, LORazepam, sodium chloride            . enoxaparin (LOVENOX) injection  0.5 mg/kg Subcutaneous Q24H  . furosemide  40 mg Intravenous BID  . lubiprostone  24 mcg Oral BID WC  . morphine  30 mg Oral BID  . multivitamin with minerals  1 tablet Oral q morning - 10a  . neomycin-bacitracin-polymyxin   Topical BID  . pantoprazole  40 mg Oral Daily  . potassium chloride  40 mEq Oral BID  . sodium chloride  3 mL Intravenous Q12H   Previous Impatient Admission/Date/Reason:   Patient reports no previous hospitalizations.   Emotional Health / Current Symptoms    Suicide/Self Harm  None reported   Suicide attempt in the past:   Patient denies any SI or HI. Patient denies any previous suicide attempts.   Other harmful behavior:   N/A   Psychotic/Dissociative Symptoms  None reported   Other Psychotic/Dissociative Symptoms:   N/A     Attention/Behavioral Symptoms  Within Normal Limits   Other Attention / Behavioral Symptoms:   Patient engaged throughout assessment.    Cognitive Impairment  Orientation - Place  Orientation - Self  Orientation - Situation  Orientation - Time   Other Cognitive Impairment:    Mood and Adjustment  Anxious    Stress, Anxiety, Trauma, Any Recent Loss/Stressor  Anxiety   Anxiety (frequency):   Patient reports she started having panic attacks when she was diagnosed with breast cancer. Patient reports that she feels that once she is done with treatment then she will start feeling better and that anxiety will subside.   Phobia (specify):   N/A   Compulsive behavior (specify):   N/A   Obsessive behavior (specify):   N/A   Other:   Patient is receiving radiation treatment for breast cancer. [place]   Substance Abuse/Use  None   SBIRT completed (please refer for detailed history):  N  Self-reported substance use:   Patient denies any substance use.   Urinary Drug Screen Completed:  N Alcohol level:   N/A    Environmental/Housing/Living Arrangement  Stable housing   Who is in the home:   Husband   Emergency contact:  [employer]  Private Insurance   Patient's Strengths and Goals (patient's own words):   Patient has supportive husband and has new support system through church.   Clinical Social Worker's Interpretive Summary:   CSW received referral to complete psychosocial assessment.  CSW reviewed chart and met with patient at bedside. CSW introduced myself and explained role.    Patient reports that she was diagnosed with breast cancer in [place]. Patient's husband took several weeks off of work to care for patient and was fired. Patient and husband were forced to move to [place] so that husband could work. Patient reports that move has been stressful and it has been difficult to move in the middle  of treatment.    Patient has been able to find a MD for cancer treatment and is involved with a local pain clinic. Patient went to the cancer center and had individual therapy sessions but has not had any sessions lately. Patient has met a friend at church who will provide transportation to cancer support group. Patient is interested in outpatient follow up with a psychiatrist for anxiety.    Patient reports that a psychologist in [place] was prescribing her Ativan for anxiety. Patient reports no follow up since being in [place]. Patient feels she needs to remain on medication until treatment is over. Patient states that medication is just an aide to get her "from point a to point b" during treatment.    Patient explains anxiety as panic attacks with trouble breathing and tightness in her chest. Patient reports she has been tearful but feels it is related to stress and her treatment. Patient does admit to depression but feels that she is feeling less depressed knowing that her treatment will be ending soon. Patient denies any psychotic symptoms and denies any SI or HI.    Patient was engaged throughout assessment. Patient was tearful at times and apologized for not "being strong." Patient reported she appreciated having someone to talk with and feels that her husband and support group will assist with transition to move.    CSW will continue to follow and will assist with any recommendations provided by psych MD.   Disposition:  Outpatient referral made/needed

## 2013-04-16 NOTE — Progress Notes (Signed)
Pt ambulated one time around entire west unit. She tolerated ambulation well and did not require oxygen. Her O2 sats remained 94-100% while ambulating on room air. Pt had no difficulty breathing or shortness of breath during ambulation. Will continue to monitor pt. Oxygen left within reach at bedside for comfort of pt.   Arta Bruce [facility] 04/16/2013 2:39 PM

## 2013-04-16 NOTE — Plan of Care (Signed)
Problem: Phase I Progression Outcomes Goal: OOB as tolerated unless otherwise ordered Outcome: Progressing Patient is a High fall risk . Needing one person asst at all times.  Goal: Voiding-avoid urinary catheter unless indicated Outcome: Progressing frequency

## 2013-04-16 NOTE — Consult Note (Signed)
Reason for Consult: Anxiety Referring Physician: Dr. [name] is an 62 y.o. female.  HPI: Patient was diagnosed with breast cancer in February.  Her husband lost his job and they had to move from their home in [place] to [place] for a job but has no family or friends in the area.  Her brother in [place] died suddenly right after their move.  [name] last day of radiation is tomorrow.  She has been having up to two panic attacks a day over the past week and requests Ativan for her anxiety.    Past Medical History  Diagnosis Date  . 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 Iron deficiency anemia   . Status post chemotherapy     4 cycles of Taxotere and cytoxan  . Chronic back pain   . Hx of pulmonary embolus     During c-Section  . Neuropathy   . Obesity     Class 2  . COPD (chronic obstructive pulmonary disease)     Past Surgical History  Procedure Laterality Date  . Cesarean section    . Right breast lumpectomy      Family History  Problem Relation Age of Onset  . Parkinson's disease Mother     Social History:  reports that she has never smoked. She has never used smokeless tobacco. She reports that she does not drink alcohol or use illicit drugs.  Allergies:  Allergies  Allergen Reactions  . Contrast Media (Iodinated Diagnostic Agents) Anaphylaxis  . Albuterol   . Prednisone Other (See Comments)    Pt gets very agitated when she takes high doses of steroids    Medications: I have reviewed the patient's current medications.  Results for orders placed during the hospital encounter of 04/13/13 (from the past 48 hour(s))  BASIC METABOLIC PANEL     Status: Abnormal   Collection Time    04/15/13  5:17 AM      Result Value Range   Sodium 138  135 - 145 mEq/L   Potassium 2.8 (*) 3.5 - 5.1 mEq/L   Comment: DELTA CHECK NOTED     REPEATED TO VERIFY   Chloride 95  (*) 96 - 112 mEq/L   CO2 36 (*) 19 - 32 mEq/L   Glucose, Bld 121 (*) 70 - 99 mg/dL   BUN 38 (*) 6 - 23 mg/dL   Creatinine, Ser 1.47  0.50 - 1.10 mg/dL   Calcium 9.1  8.4 - 82.9 mg/dL   GFR calc non Af Amer 67 (*) >90 mL/min   GFR calc Af Amer 78 (*) >90 mL/min   Comment:            The eGFR has been calculated     using the CKD EPI equation.     This calculation has not been     validated in all clinical     situations.     eGFR's persistently     <90 mL/min signify     possible Chronic Kidney Disease.  PRO B NATRIURETIC PEPTIDE     Status: Abnormal   Collection Time    04/15/13  5:17 AM      Result Value Range   Pro B Natriuretic peptide (BNP) 825.2 (*) 0 - 125 pg/mL  BASIC METABOLIC PANEL     Status: Abnormal   Collection Time  04/15/13  7:01 PM      Result Value Range   Sodium 136  135 - 145 mEq/L   Potassium 3.2 (*) 3.5 - 5.1 mEq/L   Chloride 92 (*) 96 - 112 mEq/L   CO2 36 (*) 19 - 32 mEq/L   Glucose, Bld 123 (*) 70 - 99 mg/dL   BUN 30 (*) 6 - 23 mg/dL   Creatinine, Ser 4.69  0.50 - 1.10 mg/dL   Calcium 9.7  8.4 - 62.9 mg/dL   GFR calc non Af Amer 77 (*) >90 mL/min   GFR calc Af Amer 90 (*) >90 mL/min   Comment:            The eGFR has been calculated     using the CKD EPI equation.     This calculation has not been     validated in all clinical     situations.     eGFR's persistently     <90 mL/min signify     possible Chronic Kidney Disease.  BASIC METABOLIC PANEL     Status: Abnormal   Collection Time    04/16/13  4:10 AM      Result Value Range   Sodium 140  135 - 145 mEq/L   Potassium 3.2 (*) 3.5 - 5.1 mEq/L   Chloride 97  96 - 112 mEq/L   CO2 37 (*) 19 - 32 mEq/L   Glucose, Bld 94  70 - 99 mg/dL   BUN 27 (*) 6 - 23 mg/dL   Creatinine, Ser 5.28  0.50 - 1.10 mg/dL   Calcium 9.5  8.4 - 41.3 mg/dL   GFR calc non Af Amer 74 (*) >90 mL/min   GFR calc Af Amer 86 (*) >90 mL/min   Comment:            The eGFR has been calculated     using the CKD EPI  equation.     This calculation has not been     validated in all clinical     situations.     eGFR's persistently     <90 mL/min signify     possible Chronic Kidney Disease.    No results found.  Review of Systems  Constitutional: Negative.   HENT: Negative.   Eyes: Negative.   Respiratory: Negative.   Cardiovascular: Negative.   Gastrointestinal: Negative.   Genitourinary: Negative.   Musculoskeletal: Positive for back pain.       Leg pain from edema, right chest pain from radiation  Skin: Negative.   Neurological: Negative.   Endo/Heme/Allergies: Negative.   Psychiatric/Behavioral: The patient is nervous/anxious.    Blood pressure 127/70, pulse 96, temperature 98 F (36.7 C), temperature source Oral, resp. rate 20, height 5\' 7"  (1.702 m), weight 95.7 kg (210 lb 15.7 oz), last menstrual period 01/30/2005, SpO2 100.00%. Physical Exam Completed by primary MD, reviewed  Family History:  No family history on file.   Mental Status Examination/Evaluation: Patient is dressed appropriately with pastel nail polish.  She is anxious with congruent affect, cooperative, and engages easily in conversation.  She denies any active or passive suicidal thoughts or homicidal thoughts.  Her thoughts are organized and goal directed.  There is no paranoia or delusions present at this time.  She denies any auditory or visual hallucination. Her attention and concentration are good.  Her insight and judgment are also good.  DIAGNOSIS:  AXIS I  General Anxiety Disorder   AXIS II  Deferred  AXIS III  Breast Cancer, bilateral leg edema  AXIS IV  other psychosocial or environmental problems, chronic health issues, problems with access to health care services and problems with primary support group   AXIS V  51-60 moderate symptoms    Assessment/Plan:  Recommend Ativan 1 mg TID PRN anxiety with follow-up treatment with a psychiatrist and cancer support group.  Patient does not need inpatient  psychiatric treatment. Contact Child psychotherapist for outpatient discharge planning.  [name], PMH-NP 04/16/2013, 4:07 PM     Patient has panic symptoms and can be continued on Ativan 1MG  TID as needed for anxiety. Patient can be tried on Zoloft to see if it helps with the anxiety.

## 2013-04-16 NOTE — Progress Notes (Signed)
PT Cancellation Note  Patient Details Name: Alexandria [name] MRN: [id number] DOB: 01/12/1953   Cancelled Treatment:    Reason Eval/Treat Not Completed: Other-pt requested PT check back tomorrow. States she already walked with nursing.    Rebeca Alert, MPT Pager: [phone]

## 2013-04-17 ENCOUNTER — Encounter: Payer: Self-pay | Admitting: Radiation Oncology

## 2013-04-17 ENCOUNTER — Ambulatory Visit: Payer: Medicare Other

## 2013-04-17 ENCOUNTER — Ambulatory Visit
Admission: RE | Admit: 2013-04-17 | Discharge: 2013-04-17 | Disposition: A | Discharge status: Home / Self Care | Payer: Medicare Other | Source: Ambulatory Visit | Attending: Radiation Oncology | Admitting: Radiation Oncology

## 2013-04-17 LAB — BASIC METABOLIC PANEL
BUN: 22 mg/dL (ref 6–23)
CO2: 35 mEq/L — ABNORMAL HIGH (ref 19–32)
Glucose, Bld: 100 mg/dL — ABNORMAL HIGH (ref 70–99)
Potassium: 3.6 mEq/L (ref 3.5–5.1)
Sodium: 139 mEq/L (ref 135–145)

## 2013-04-17 LAB — CBC
HCT: 28.2 % — ABNORMAL LOW (ref 36.0–46.0)
Hemoglobin: 8.7 g/dL — ABNORMAL LOW (ref 12.0–15.0)
MCHC: 30.9 g/dL (ref 30.0–36.0)
RBC: 3.6 MIL/uL — ABNORMAL LOW (ref 3.87–5.11)

## 2013-04-17 MED ORDER — HYDROCERIN EX CREA
TOPICAL_CREAM | Freq: Two times a day (BID) | CUTANEOUS | Status: DC
  Administered 2013-04-17 – 2013-04-20 (×7): via TOPICAL
  Filled 2013-04-17: qty 113

## 2013-04-17 MED ORDER — FUROSEMIDE 80 MG PO TABS
80.0000 mg | ORAL_TABLET | Freq: Two times a day (BID) | ORAL | Status: DC
  Administered 2013-04-17 – 2013-04-18 (×2): 80 mg via ORAL
  Filled 2013-04-17 (×4): qty 1

## 2013-04-17 NOTE — Progress Notes (Signed)
Patient not willing to try antidepressant

## 2013-04-17 NOTE — Progress Notes (Addendum)
[facility] PROGRESS NOTE  Alexandria [name] ZOX:[id number] DOB: 01/12/1953 DOA: 04/13/2013 PCP: Rogelia Boga, MD Medical Oncologist: Dr.Khan Radiation Oncologist: Dr. Basilio Cairo  Brief narrative 62 year old female, retired ICU nurse, breast cancer status post chemotherapy in [place], on radiation therapy, lifelong nonsmoker, anemia, chronic pain, obesity, documented COPD, history of PE, recently moved from [place] to the [place] area 4-5 weeks back, presented to the [facility] ED on 04/13/13 with complaints of weight gain, lower extremity swelling, generalized weakness, fatigue and dyspnea. She used to be on home oxygen 6 months back and then stopped by herself when she moved from [place] to [place]. She has a pulse ox at home and has been intermittently hypoxic in [place] and prior to this admission-83% in the ED. She denied chest pain or cough. In the ED, she received Lasix for fluid overload and became hypotensive which was resuscitated with IVF. Patient admitted for further management.  Assessment/Plan: 1. Dyspnea/acute on chronic hypoxic respiratory failure:  - Etiology multifactorial -vocal cord dysfunction, h/o multiple intubations -Acute on chronic diastolic CHF -Anxiety  All of the above likely contributing along with obesity/deconditioning etc, ? Reflux -continue  Lasix, change to PO 80mg  Q12, negative 8L and weight down  -Echo: LVEF 65-70%. -Appreciate Pulmonary consult  -psychiatry evaluation completed, declines anti-depressant, since anxiety felt to be a significant contributor to her dyspnea  2. Fluid overload/anasarca/possible acute on chronic diastolic CHF: Management per problem #1. Improving.  3. Anxiety disorder: Continue PRN Ativan. Psychiatric consultation completed, declines anti-depressants  4. Chronic pain:  continue home dose of MS Contin and when necessary Dilaudid. Patient follows with pain M.D. OP.  5. Hypokalemia: Replete as needed and  follow BMP.  6. Microcytic anemia: Stable.  7. Acute renal insufficiency: resolved. Monitor while on Lasix.  8. Breast cancer: Stage 1, Continue radiation treatment. Outpatient followup with oncology. Discussed with Dr. Evonnie Pat triple antibiotic cream for radiation induced skin changes of breasts..  Ambulate, Pt/Ot eval  Code Status: Full Family Communication: None Disposition Plan: Home tomorrow if stable   Consultants:  Radiation oncology  Psychiatry-pending  Pulmonology  Procedures:  None  Antibiotics:  None   HPI/Subjective: Dyspnea and leg swelling better, just back from XRT.  Objective: Filed Vitals:   04/16/13 1509 04/16/13 2051 04/17/13 0611 04/17/13 1441  BP: 127/70 127/68 124/74 108/60  Pulse: 96 94 99 90  Temp: 98 F (36.7 C) 98.5 F (36.9 C) 98.3 F (36.8 C) 98.5 F (36.9 C)  TempSrc: Oral Oral Oral Oral  Resp: 20 18 18 18   Height:      Weight:      SpO2: 100% 92% 95% 100%    Intake/Output Summary (Last 24 hours) at 04/17/13 1552 Last data filed at 04/17/13 1300  Gross per 24 hour  Intake    840 ml  Output   2550 ml  Net  -1710 ml   Filed Weights   04/13/13 2239 04/14/13 0022 04/15/13 0508  Weight: 97.523 kg (215 lb) 91.717 kg (202 lb 3.2 oz) 95.7 kg (210 lb 15.7 oz)    Exam:   General exam: Obese. No obvious distress, anxious  Respiratory system: Slightly reduced breath sounds in the bases with occasional basal crackles. upper airway wheeze noted  Cardiovascular system: S1 & S2 heard, RRR. No JVD, murmurs, gallops, clicks. 3+ pitting bilateral leg edema extending to groin and lower back-seems slightly better than yesterday..  Gastrointestinal system: Abdomen is nondistended, soft and nontender. Normal bowel sounds heard.  Central nervous system: Alert and  oriented. No focal neurological deficits.  Extremities: Symmetric 5 x 5 power, 1-2 plus edema.  Psychiatry: Anxious.   Data Reviewed: Basic Metabolic  Panel:  Recent Labs Lab 04/14/13 0510 04/15/13 0517 04/15/13 1901 04/16/13 0410 04/17/13 0500  NA 136 138 136 140 139  K 3.6 2.8* 3.2* 3.2* 3.6  CL 91* 95* 92* 97 97  CO2 32 36* 36* 37* 35*  GLUCOSE 144* 121* 123* 94 100*  BUN 50* 38* 30* 27* 22  CREATININE 1.24* 0.92 0.82 0.85 0.84  CALCIUM 9.9 9.1 9.7 9.5 9.7   Liver Function Tests:  Recent Labs Lab 04/13/13 2025  AST 23  ALT 18  ALKPHOS 120*  BILITOT 0.3  PROT 7.2  ALBUMIN 3.5   No results found for this basename: LIPASE, AMYLASE,  in the last 168 hours No results found for this basename: AMMONIA,  in the last 168 hours CBC:  Recent Labs Lab 04/13/13 2025 04/14/13 0510 04/17/13 0500  WBC 6.0 4.0 3.6*  NEUTROABS 4.9  --   --   HGB 8.8* 9.4* 8.7*  HCT 27.3* 29.7* 28.2*  MCV 77.6* 77.3* 78.3  PLT 270 287 248   Cardiac Enzymes: No results found for this basename: CKTOTAL, CKMB, CKMBINDEX, TROPONINI,  in the last 168 hours BNP (last 3 results)  Recent Labs  04/13/13 1925 04/15/13 0517  PROBNP 1218.0* 825.2*   CBG: No results found for this basename: GLUCAP,  in the last 168 hours  No results found for this or any previous visit (from the past 240 hour(s)).   Studies: No results found.   Additional labs:   Scheduled Meds: . enoxaparin (LOVENOX) injection  0.5 mg/kg Subcutaneous Q24H  . furosemide  80 mg Oral BID  . hydrocerin   Topical BID  . lubiprostone  24 mcg Oral BID WC  . morphine  30 mg Oral BID  . multivitamin with minerals  1 tablet Oral q morning - 10a  . neomycin-bacitracin-polymyxin   Topical BID  . pantoprazole  40 mg Oral Daily  . potassium chloride  40 mEq Oral BID  . 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   If 8PM-8AM, please contact night-coverage at [email or web address], password [facility] 04/17/2013, 3:52 PM  LOS: 4 days

## 2013-04-17 NOTE — Progress Notes (Signed)
Clinical Social Work Progress Note PSYCHIATRY SERVICE LINE 04/17/2013  Patient:  Alexandria [name]  Account:  [id number]  Admit Date:  04/13/2013  Clinical Social Worker:  Unk Lightning, LCSW  Date/Time:  04/17/2013 01:00 PM  Review of Patient  Overall Medical Condition:   Patient reports that MD is hoping to DC tomorrow.   Participation Level:  Active  Participation Quality  Appropriate   Other Participation Quality:   Patient engaged throughout session.   Affect  Anxious   Cognitive  Alert   Reaction to Medications/Concerns:   Patient reports she is happy that MD has agreed to continue her medications for anxiety.   Modes of Intervention  Support   Summary of Progress/Plan at Discharge   CSW met with patient at bedside. Patient sitting on bed and reports that she just finished painting her nails. Patient reports she met with psych MD last night and is happy that they are agreeable to prescribe medication.    Patient reports she is feeling better and happy to DC soon. Patient reports that her family has sent her flowers and her husband has come to visit which has helped with her mood. Patient reports that she is trying to stay active and keep her mind busy while being in the hospital.    Patient reports that she is interested in following up with a psychiatrist at DC. CSW provided referrals to an agency who is accepting new patients with her insurance. CSW also placed information on AVS as well.    CSW will continue to follow to provide support. Patient thanked CSW for visiting and is agreeable for CSW to continue to follow.

## 2013-04-17 NOTE — Evaluation (Signed)
Physical Therapy Evaluation Patient Details Name: Alexandria [name] MRN: [id number] DOB: 01/12/1953 Today's Date: 04/17/2013 Time: 0915-1020 PT Time Calculation (min): 65 min  PT Assessment / Plan / Recommendation History of Present Illness  Alexandria [name] is a 62 y.o. female This 62 year old female has gained over 80 pounds in the last 3 months due to edema from chemotherapy for breast cancer, she has several weeks of gradually worsening painful bilateral lower leg edema, she has gradually worsening generalized weakness and fatigue and shortness of breath for the last several weeks as well the worsening shortness of breath for last few days with hypoxia today with room air pulse oximetry 83% since she came to the ED for evaluation.  She is undergoing radiation for breast Ca and has been having panic  attacks and difficulty dealing with the significant changes in her life in past year.  Clinical Impression  Pt LE lymphedema presentation is atypical after a pt has had treatment for breast Ca, but it may be attributed to bilateral lymph involvement from previous abdominal surgeries and current problems from pt right inguinal hernia? Will need to proceed carefully due to current CHF, but I think she is a good candidate for (CDT) complete decongestive therapy that is offerred at [facility] on [facility].  Pt reports she has been there for evaluation already and planned to go back once radiation was finished. I began instruction in an exercise program and instructed her in use of a cream like Eucerin to provide skin care. She is also concerned about painful area on sole of right foot. She may benefit from a WOC consult to advise about dressings for that.  Will continue to follow her as an inpatient.     PT Assessment  Patient needs continued PT services    Follow Up Recommendations  Outpatient PT;Other (comment) (pt  has been going to [employer] program at [facility])    Does the patient  have the potential to tolerate intense rehabilitation      Barriers to Discharge        Equipment Recommendations  Other (comment) (???pt may need RW to offload right foot to walk?)    Recommendations for Other Services     Frequency Min 3X/week    Precautions / Restrictions     Pertinent Vitals/Pain Pt reports pain in both legs from the heaviness of them and also sole of right foot and groin      Mobility  Bed Mobility Bed Mobility: Supine to Sit;Sit to Supine Supine to Sit: 7: Independent Sit to Supine: 7: Independent Transfers Transfers: Sit to Stand;Stand to Sit Sit to Stand: 7: Independent Stand to Sit: 7: Independent Ambulation/Gait Ambulation/Gait Assistance: 7: Independent Ambulation Distance (Feet):  (in room) Gait Pattern: Antalgic    Exercises General Exercises - Lower Extremity Ankle Circles/Pumps: AROM;Both;15 reps;Supine Gluteal Sets: AROM;Both;5 reps;Supine Hip ABduction/ADduction: AAROM;Both;10 reps;Supine Hip Flexion/Marching: AAROM;Both;10 reps;Supine Other Exercises Other Exercises: performed gentle manual lymph drainage techniques to abdomen and lower legs. Applies lotion to skin and requested eucerin-type of cream from RN Other Exercises: Issued written home ex program for beginning core and LE ex   PT Diagnosis: Acute pain;Generalized weakness  PT Problem List: Pain;Other (comment) (edema) PT Treatment Interventions: Therapeutic activities;Therapeutic exercise     PT Goals(Current goals can be found in the care plan section) Acute Rehab PT Goals Patient Stated Goal: to get relief from LE pain and edema PT Goal Formulation: With patient Time For Goal Achievement: 05/01/13 Potential to Achieve  Goals: Good  Visit Information  Last PT Received On: 04/17/13 History of Present Illness: Alexandria [name] is a 62 y.o. female This 62 year old female has gained over 80 pounds in the last 3 months due to edema from chemotherapy for breast cancer, she has  several weeks of gradually worsening painful bilateral lower leg edema, she has gradually worsening generalized weakness and fatigue and shortness of breath for the last several weeks as well the worsening shortness of breath for last few days with hypoxia today with room air pulse oximetry 83% since she came to the ED for evaluation.  She is undergoing radiation for breast Ca and has been having panic  attacks and difficulty dealing with the significant changes in her life in past year.       Prior Functioning       Cognition  Cognition Arousal/Alertness: Awake/alert Behavior During Therapy: WFL for tasks assessed/performed (becomes easily agitated) Overall Cognitive Status: Within Functional Limits for tasks assessed    Extremity/Trunk Assessment Lower Extremity Assessment Lower Extremity Assessment: RLE deficits/detail;LLE deficits/detail RLE Deficits / Details: Significant nonpitting edema in lower leg and foot with fibrotic changes in skin above the ankle   Pt had difficutly lifting leg due to heaviness of leg and pain in right groin from a longstanding "hernia"  Pt has a puffy area on sole of foot (possibly callus area?) with darkened central area.  It is not an open wound but is very painful to the touch and extremely painful when pt walks on it. LLE Deficits / Details: Significant nonpitting edema in lower leg and foot with fibrotic changes in skin above the ankle  Cervical / Trunk Assessment Cervical / Trunk Assessment: Other exceptions Cervical / Trunk Exceptions: pt with multiple well healed incisions from previous abdominal surgeries for "hernia repair"   Balance Balance Balance Assessed: Yes Static Sitting Balance Static Sitting - Balance Support: No upper extremity supported;Feet supported Static Standing Balance Static Standing - Balance Support: No upper extremity supported  End of Session PT - End of Session Activity Tolerance: Patient tolerated treatment well;Other  (comment) (pt reports some relief of pain with treatment) Patient left: in chair;Other (comment) (MD in room) Nurse Communication: Other (comment) (need for skin care, results of treatment)  GP    Bayard Hugger. Manson Passey, [place] [phone] 04/17/2013, 10:53 AM

## 2013-04-17 NOTE — Progress Notes (Signed)
Recommend Ativan TID PRN anxiety without an antidepressant after re-evaluation and the desire of the patient not wanting to be on an antidepressant

## 2013-04-18 ENCOUNTER — Ambulatory Visit: Payer: Medicare Other

## 2013-04-18 ENCOUNTER — Ambulatory Visit: Payer: BC Managed Care – PPO

## 2013-04-18 ENCOUNTER — Other Ambulatory Visit (HOSPITAL_COMMUNITY): Payer: BC Managed Care – PPO

## 2013-04-18 LAB — BASIC METABOLIC PANEL
BUN: 18 mg/dL (ref 6–23)
CO2: 34 mEq/L — ABNORMAL HIGH (ref 19–32)
Chloride: 98 mEq/L (ref 96–112)
Creatinine, Ser: 0.82 mg/dL (ref 0.50–1.10)
GFR calc Af Amer: 90 mL/min — ABNORMAL LOW (ref >90)
Glucose, Bld: 113 mg/dL — ABNORMAL HIGH (ref 70–99)
Potassium: 3.2 mEq/L — ABNORMAL LOW (ref 3.5–5.1)

## 2013-04-18 MED ORDER — FUROSEMIDE 80 MG PO TABS
80.0000 mg | ORAL_TABLET | Freq: Three times a day (TID) | ORAL | Status: DC
  Administered 2013-04-18 – 2013-04-20 (×6): 80 mg via ORAL
  Filled 2013-04-18 (×8): qty 1

## 2013-04-18 MED ORDER — POTASSIUM CHLORIDE CRYS ER 20 MEQ PO TBCR
40.0000 meq | EXTENDED_RELEASE_TABLET | Freq: Three times a day (TID) | ORAL | Status: DC
  Administered 2013-04-18 – 2013-04-19 (×3): 40 meq via ORAL
  Filled 2013-04-18 (×5): qty 2

## 2013-04-18 MED ORDER — DILTIAZEM HCL 25 MG/5ML IV SOLN
5.0000 mg | Freq: Once | INTRAVENOUS | Status: AC
  Administered 2013-04-18: 5 mg via INTRAVENOUS
  Filled 2013-04-18: qty 5

## 2013-04-18 MED ORDER — RESOURCE INSTANT PROTEIN PO PWD PACKET
1.0000 | Freq: Three times a day (TID) | ORAL | Status: DC
  Administered 2013-04-19: 6 g via ORAL
  Filled 2013-04-18: qty 6
  Filled 2013-04-18: qty 227

## 2013-04-18 NOTE — Progress Notes (Signed)
Physical Therapy Treatment Patient Details Name: Alexandria [name] MRN: [id number] DOB: 01/12/1953 Today's Date: 04/18/2013 Time: 2952-8413 PT Time Calculation (min): 50 min  PT Assessment / Plan / Recommendation  History of Present Illness Pt continues to c/o pain from raised area on plantar aspect of right foot. Discussed post op shoe with MD   PT Comments   Pt continues to be anxious at times She shared with me some problems with current sitter service and will benefit from a list of sitter services to choose another.   Follow Up Recommendations  Outpatient PT;Other (comment) (pt  has been going to [employer] program at [facility])     Does the patient have the potential to tolerate intense rehabilitation     Barriers to Discharge        Equipment Recommendations  Other (comment) (???pt may need RW to offload right foot to walk?)    Recommendations for Other Services    Frequency Min 3X/week   Progress towards PT Goals Progress towards PT goals: Progressing toward goals  Plan      Precautions / Restrictions Restrictions Weight Bearing Restrictions: No   Pertinent Vitals/Pain Pain in RLE    Mobility  Bed Mobility Bed Mobility: Supine to Sit;Sit to Supine Supine to Sit: 7: Independent Sit to Supine: 7: Independent Transfers Transfers: Sit to Stand;Stand to Sit Sit to Stand: 7: Independent Stand to Sit: 7: Independent Ambulation/Gait Ambulation/Gait Assistance: 7: Independent Ambulation Distance (Feet): 200 Feet Assistive device: None Ambulation/Gait Assistance Details: pt needed encouragement to slow down and not talk  to keep O2 sats > 90 % Gait Pattern: Antalgic General Gait Details: Pt limited by c/o pain especailly from right foot Stairs: Yes Stairs Assistance: 4: Min guard Stair Management Technique: One rail Right Number of Stairs: 1 Wheelchair Mobility Wheelchair Mobility: No    Exercises General Exercises - Lower Extremity Ankle Circles/Pumps: AROM;Both;15  reps;Supine Gluteal Sets: AROM;Both;5 reps;Supine Hip ABduction/ADduction: AAROM;Both;10 reps;Supine Hip Flexion/Marching: AAROM;Both;10 reps;Supine Other Exercises Other Exercises: performed gentle manual lymph drainage techniques to abdomen and lower legs. Applies lotion to skin and requested eucerin-type of cream from RN Other Exercises: Issued written home ex program for beginning core and LE ex   PT Diagnosis:    PT Problem List:   PT Treatment Interventions:     PT Goals (current goals can now be found in the care plan section) Acute Rehab PT Goals Patient Stated Goal: to get relief from LE pain and edema Time For Goal Achievement: 05/01/13  Visit Information  Last PT Received On: 04/18/13 Assistance Needed: +1 History of Present Illness: Pt continues to c/o pain from raised area on plantar aspect of right foot. Discussed post op shoe with MD    Subjective Data  Patient Stated Goal: to get relief from LE pain and edema   Cognition  Cognition Arousal/Alertness: Awake/alert Behavior During Therapy: WFL for tasks assessed/performed Overall Cognitive Status: Within Functional Limits for tasks assessed    Balance     End of Session PT - End of Session Activity Tolerance: Patient tolerated treatment well;Other (comment) (pt reports some relief of pain with treatment) Patient left: in bed Nurse Communication: Other (comment) (need for skin care, results of treatment)   GP    Bayard Hugger. Manson Passey, [place] [phone] 04/18/2013, 10:13 AM

## 2013-04-18 NOTE — Progress Notes (Signed)
Called back into pt's room pt states she had forgotten that she had gone to DR. Eleonore Chiquito, to establish PCP.  I will follow up on Monday for appointment with PCP.

## 2013-04-18 NOTE — Progress Notes (Addendum)
Activity O2 sats:   Room air Rest: 88% Ambulation: 82% when walking at regular pace and talking   With 2L O2  Rest 94% Ambulation with 2 L O2 : 93%  Laurieann Friddle K. Manson Passey, [place] [phone]

## 2013-04-18 NOTE — Progress Notes (Signed)
Orthopedic Tech Progress Note Patient Details:  Alexandria [name] 01/12/1953 [id number] Sized pt. for bilateral post op shoes.  Left in patient's room for use by physical therapy.  Did not place on pt. Ortho Devices Type of Ortho Device: Postop shoe/boot Ortho Device/Splint Location: bilaterally   Lesle Chris 04/18/2013, 3:47 PM

## 2013-04-18 NOTE — Progress Notes (Signed)
Clinical Social Work Progress Note PSYCHIATRY SERVICE LINE 04/18/2013  Patient:  Alexandria [name]  Account:  [id number]  Admit Date:  04/13/2013  Clinical Social Worker:  Unk Lightning, LCSW  Date/Time:  04/18/2013 11:35 AM  Review of Patient  Overall Medical Condition:   Patient reports she is feeling better and hopeful to DC tomorrow.   Participation Level:  Active  Participation Quality  Appropriate   Other Participation Quality:   Patient engaged throughout assessment.   Affect  Appropriate   Cognitive  Appropriate   Reaction to Medications/Concerns:   Patient feels that anxiety medication is helpful.   Modes of Intervention  Support   Summary of Progress/Plan at Discharge   CSW attempted to meet with patient in room. Patient was on the phone and asked CSW to return at later time. Patient found CSW in hallway and asked to speak with her. CSW offered to return to room but patient reported she wanted to walk around.    Patient reports that MD is hopeful that she can DC tomorrow. Patient reports since she is new to the area that she would be interested in a PCP list and [facility] agency list so that she can have follow up care. CSW made CM aware of needs.    Patient reports that she is feeling better and is happy that she is done with her treatments. Patient feels that being able to be mobile and that she does not have treatment will assist with her feeling less depressed and anxious. CSW reminded patient about follow up with psychiatrist on outpatient basis. Patient reports once she returns home and feels better she will schedule an appointment.    CSW provided patient with CSW contact information and encouraged patient to call with any further needs. CSW will continue to follow throughout hospital stay.
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
## 2013-04-18 NOTE — Progress Notes (Signed)
Chaplain provided support at bedside at pt request.   Pt spoke with chaplain about recent move to [facility] and death of brother, who was a priest in [place].   Pt concerned about finding a parish in [place] area.  Reports that during her time in [place], she was not able to find a parish.   Conversation shortened due to clinical needs (pt conversation with MD) chaplain will follow up with pt.     Belva Crome MDiv

## 2013-04-18 NOTE — Progress Notes (Signed)
Physical Therapy Note  Found pt up ambulating in hallway with no supplemental O2.  Pt encouraged to return to room and elevate legs to try to control edema and keep O2 sats > 90%.  Pt complied.  Ortho techs provided post op shoes with adjustable velcro as she no longer has shoes that fit her feet.  I fashioned an ABD pad in right shoe to offload painful area at ball of foot for her to wear when ambulating longer distances.    Note that right lower leg appears more red this afternoon, but it is not warm.  3+ pitting edema continues and does not appear to be improved.    Pt had episodes of tearfulness and anxiety this afternoon.  Provided encouragement to patient. Recommend that pt ambulate with nursing and supplemental O2 and use her cane for support as the post op shoes do not allow normal foot movement. Pt also expresses concern about finances and cost of home care sitters.  Anticipate she will need close follow up at d/c. Bayard Hugger. Manson Passey, PT

## 2013-04-18 NOTE — Progress Notes (Signed)
[facility] progress note. Chief complaint. Dyspnea, chest tightness, tachycardia. History of present illness. This 62 year old female in hospital with acute on chronic hypoxic respiratory failure. She was noted by the nursing staff to be dyspneic following a trip to the bathroom. Her vital signs indicated tachycardia in the 120 to 130 range. Patient also complained of some low sternal area chest tightness. A 12-lead EKG was obtained indicating supraventricular tachycardia present otherwise unremarkable EKG. I came to see the patient at the bedside and she states that the sensation of pressure has nearly resolved. She denies associated nausea or diaphoresis. Denies radiation to the left are left arm. Patient has no listed history of coronary artery disease. She states she does have a hiatal hernia and some problems with GERD. Vital signs. Temperature 97.8, pulse 136, respiration 22, blood pressure 108/62. O2 sats 95%. General appearance. Well-developed elderly female who is alert, cooperative, and in no distress. Cardiac. Tachycardic but regular. Patient has a significant lower extremity edema. Mild jugular venous distention is present. Lungs. Course rhonchi heard in all lung fields. No distress and able to talk in full sentences without dyspnea. Abdomen. Soft obese with positive bowel sounds. No pain. Impression/plan. Problem #1. Substernal chest pressure. 12-lead EKG did not suggest acute ischemia. Nonetheless I will obtain troponin now and then every 6 hours for a total of 3 sets. We'll follow for these results. Repeat EKG in the a.m. to dilate for any changes. Problem #2. Tachycardia/supraventricular tachycardia. Will provide the patient to a low dose Cardizem bolus 5 mg x1.

## 2013-04-18 NOTE — Progress Notes (Signed)
[facility] PROGRESS NOTE  Alexandria [name] ZOX:[id number] DOB: 01/12/1953 DOA: 04/13/2013 PCP: Rogelia Boga, MD Medical Oncologist: Dr.Khan Radiation Oncologist: Dr. Basilio Cairo  Brief narrative 62 year old female, retired ICU nurse, breast cancer status post chemotherapy in [place], on radiation therapy, lifelong nonsmoker, anemia, chronic pain, obesity, documented COPD, history of PE, recently moved from [place] to the [place] area 4-5 weeks back, presented to the [facility] ED on 04/13/13 with complaints of weight gain, lower extremity swelling, generalized weakness, fatigue and dyspnea. She used to be on home oxygen 6 months back and then stopped by herself when she moved from [place] to [place]. She has a pulse ox at home and has been intermittently hypoxic in [place] and prior to this admission-83% in the ED. She denied chest pain or cough. In the ED, she received Lasix for fluid overload and became hypotensive which was resuscitated with IVF. Patient admitted for further management.  Assessment/Plan: 1. Dyspnea/acute on chronic hypoxic respiratory failure:  - Etiology multifactorial -vocal cord dysfunction, h/o multiple intubations -Acute on chronic diastolic CHF -Anxiety  All of the above likely contributing along with obesity/deconditioning etc, ? Reflux -continue  Lasix, incraese to PO 80mg  Q8, urine output slowed since changing to PO lasix -negative 8L and weight down  -Echo: LVEF 65-70%. -Appreciate Pulmonary consult  -psychiatry evaluation completed, declines anti-depressant, since anxiety felt to be a significant contributor to her dyspnea -nutrition consult  2. Fluid overload/anasarca/possible acute on chronic diastolic CHF: Management per problem #1. Improving.  3. Anxiety disorder: Continue PRN Ativan. Psychiatric consultation completed, declines anti-depressants  4. Chronic pain:  continue home dose of MS Contin and when necessary Dilaudid.  Patient follows with pain M.D. OP.  5. Hypokalemia: Replete as needed and follow BMP.  6. Microcytic anemia: Stable.  7. Acute renal insufficiency: resolved. Monitor while on Lasix.  8. Breast cancer: Stage 1, Continue radiation treatment. Outpatient followup with oncology. Discussed with Dr. Evonnie Pat triple antibiotic cream for radiation induced skin changes of breasts..  Ambulate, Pt/Ot eval  Code Status: Full Family Communication: None Disposition Plan: Home tomorrow if stable   Consultants:  Radiation oncology  Psychiatry-pending  Pulmonology  Procedures:  None  Antibiotics:  None   HPI/Subjective: Dyspnea and leg swelling better, just back from XRT.  Objective: Filed Vitals:   04/17/13 0611 04/17/13 1441 04/17/13 2039 04/18/13 0501  BP: 124/74 108/60 133/79 119/59  Pulse: 99 90 111 95  Temp: 98.3 F (36.8 C) 98.5 F (36.9 C) 98.1 F (36.7 C) 98.3 F (36.8 C)  TempSrc: Oral Oral Oral Oral  Resp: 18 18 18 18   Height:      Weight:    95.9 kg (211 lb 6.7 oz)  SpO2: 95% 100% 94% 92%    Intake/Output Summary (Last 24 hours) at 04/18/13 1431 Last data filed at 04/18/13 0700  Gross per 24 hour  Intake    720 ml  Output    300 ml  Net    420 ml   Filed Weights   04/14/13 0022 04/15/13 0508 04/18/13 0501  Weight: 91.717 kg (202 lb 3.2 oz) 95.7 kg (210 lb 15.7 oz) 95.9 kg (211 lb 6.7 oz)    Exam:   General exam: Obese. No obvious distress, anxious  Respiratory system: Slightly reduced breath sounds in the bases with occasional basal crackles. upper airway wheeze noted  Cardiovascular system: S1 & S2 heard, RRR. No JVD, murmurs, gallops, clicks. 3+ pitting bilateral leg edema extending to groin and lower back-seems slightly better than  yesterday..  Gastrointestinal system: Abdomen is nondistended, soft and nontender. Normal bowel sounds heard.  Central nervous system: Alert and oriented. No focal neurological deficits.  Extremities:  Symmetric 5 x 5 power, 1-2 plus edema.  Psychiatry: Anxious.   Data Reviewed: Basic Metabolic Panel:  Recent Labs Lab 04/15/13 0517 04/15/13 1901 04/16/13 0410 04/17/13 0500 04/18/13 0455  NA 138 136 140 139 139  K 2.8* 3.2* 3.2* 3.6 3.2*  CL 95* 92* 97 97 98  CO2 36* 36* 37* 35* 34*  GLUCOSE 121* 123* 94 100* 113*  BUN 38* 30* 27* 22 18  CREATININE 0.92 0.82 0.85 0.84 0.82  CALCIUM 9.1 9.7 9.5 9.7 9.3   Liver Function Tests:  Recent Labs Lab 04/13/13 2025  AST 23  ALT 18  ALKPHOS 120*  BILITOT 0.3  PROT 7.2  ALBUMIN 3.5   No results found for this basename: LIPASE, AMYLASE,  in the last 168 hours No results found for this basename: AMMONIA,  in the last 168 hours CBC:  Recent Labs Lab 04/13/13 2025 04/14/13 0510 04/17/13 0500  WBC 6.0 4.0 3.6*  NEUTROABS 4.9  --   --   HGB 8.8* 9.4* 8.7*  HCT 27.3* 29.7* 28.2*  MCV 77.6* 77.3* 78.3  PLT 270 287 248   Cardiac Enzymes: No results found for this basename: CKTOTAL, CKMB, CKMBINDEX, TROPONINI,  in the last 168 hours BNP (last 3 results)  Recent Labs  04/13/13 1925 04/15/13 0517  PROBNP 1218.0* 825.2*   CBG: No results found for this basename: GLUCAP,  in the last 168 hours  No results found for this or any previous visit (from the past 240 hour(s)).   Studies: No results found.   Additional labs:   Scheduled Meds: . enoxaparin (LOVENOX) injection  0.5 mg/kg Subcutaneous Q24H  . furosemide  80 mg Oral TID  . hydrocerin   Topical BID  . lubiprostone  24 mcg Oral BID WC  . morphine  30 mg Oral BID  . multivitamin with minerals  1 tablet Oral q morning - 10a  . neomycin-bacitracin-polymyxin   Topical BID  . pantoprazole  40 mg Oral Daily  . potassium chloride  40 mEq Oral TID  . sodium chloride  3 mL Intravenous Q12H   Continuous Infusions:    Principal Problem:   Acute-on-chronic respiratory failure Active Problems:   Malignant neoplasm of upper-outer quadrant of female breast    Chronic back pain   Hypokalemia   AKI (acute kidney injury)   Anemia   Acute on chronic diastolic CHF (congestive heart failure)   Anxiety state, unspecified   Anasarca   Vocal cord dysfunction    Time spent: 30 minutes    [facility]  [facility] Pager [phone].   If 8PM-8AM, please contact night-coverage at [email or web address], password [facility] 04/18/2013, 2:31 PM  LOS: 5 days

## 2013-04-18 NOTE — Progress Notes (Signed)
INITIAL NUTRITION ASSESSMENT  Pt meets criteria for severe MALNUTRITION in the context of chronic illness as evidenced by <75% estimated energy intake in the past month with severe fluid accumulation in lower extremities in addition to pt with worsening generalized weakness.  DOCUMENTATION CODES Per approved criteria  -Severe malnutrition in the context of chronic illness   INTERVENTION: - Recommend MD consider appetite stimulant r/t pt's ongoing poor appetite - Beneprotein powder packet TID to mix with liquids (drinks and soups) - Had long discussion about sodium restriction with pt's heart failure. Handouts provided, teach back method used. Also discussed high calorie/protein diet to promote muscle strength/building. Provided [facility] RD contact information. - Will continue to monitor   NUTRITION DIAGNOSIS: Inadequate oral intake related to poor appetite as evidenced by 0-5% meal intake.   Goal: Pt to consume >25-50% of meals and use protein powder  Monitor:  Weights, labs, intake  Reason for Assessment: Consult   62 y.o. female  Admitting Dx: Acute-on-chronic respiratory failure  ASSESSMENT: Pt with breast CA s/p 4 cycles of chemotherapy and getting radiation treatment, with 80 pound unintended weight gain in the past 3 months from edema. Met with pt who reports poor appetite PTA with pt getting < 1 meal/day in. States she will make 3 meals/day but just not want to eat it. Does not like nutritional supplements but willing to try protein powder to mix into foods. PO intake 0-5% of meals in the past few days.   Height: Ht Readings from Last 1 Encounters:  04/14/13 5\' 7"  (1.702 m)    Weight: Wt Readings from Last 1 Encounters:  04/18/13 211 lb 6.7 oz (95.9 kg)    Ideal Body Weight: 135 lb  % Ideal Body Weight: 156%  Wt Readings from Last 10 Encounters:  04/18/13 211 lb 6.7 oz (95.9 kg)  04/08/13 215 lb (97.523 kg)  04/07/13 211 lb 14.4 oz (96.117 kg)   03/31/13 207 lb 11.2 oz (94.212 kg)  03/27/13 213 lb 12.8 oz (96.979 kg)  03/17/13 209 lb 8 oz (95.029 kg)  03/13/13 207 lb 11.2 oz (94.212 kg)  03/04/13 209 lb 11.2 oz (95.119 kg)    Usual Body Weight: 131 lb per pt  % Usual Body Weight: 161%  BMI:  Body mass index is 33.11 kg/(m^2). not accurate with significant lower extremity edema  Estimated Nutritional Needs: Kcal: 1550-1750 Protein: 75-85g Fluid: 1.5-1.7L/day  Skin: Deep tissue injury on ball of right foot, +3 RLE, LLE edema  Diet Order: General  EDUCATION NEEDS: -Education needs addressed - discussed diet for heart failure and high protein/calorie diet    Intake/Output Summary (Last 24 hours) at 04/18/13 1604 Last data filed at 04/18/13 1500  Gross per 24 hour  Intake    680 ml  Output    801 ml  Net   -121 ml    Last BM: 7/30  Labs:   Recent Labs Lab 04/16/13 0410 04/17/13 0500 04/18/13 0455  NA 140 139 139  K 3.2* 3.6 3.2*  CL 97 97 98  CO2 37* 35* 34*  BUN 27* 22 18  CREATININE 0.85 0.84 0.82  CALCIUM 9.5 9.7 9.3  GLUCOSE 94 100* 113*    CBG (last 3)  No results found for this basename: GLUCAP,  in the last 72 hours  Scheduled Meds: . enoxaparin (LOVENOX) injection  0.5 mg/kg Subcutaneous Q24H  . furosemide  80 mg Oral TID  . hydrocerin   Topical BID  . lubiprostone  24 mcg Oral BID WC  . morphine  30 mg Oral BID  . multivitamin with minerals  1 tablet Oral q morning - 10a  . neomycin-bacitracin-polymyxin   Topical BID  . pantoprazole  40 mg Oral Daily  . potassium chloride  40 mEq Oral TID  . sodium chloride  3 mL Intravenous Q12H    Continuous Infusions:   Past Medical History  Diagnosis Date  . 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 Iron deficiency anemia   . Status post chemotherapy     4 cycles of Taxotere and cytoxan  . Chronic back pain   . Hx of pulmonary embolus      During c-Section  . Neuropathy   . Obesity     Class 2  . COPD (chronic obstructive pulmonary disease)     Past Surgical History  Procedure Laterality Date  . Cesarean section    . Right breast lumpectomy      Levon Hedger MS, RD, LDN [phone] Pager [phone] After Hours Pager

## 2013-04-19 DIAGNOSIS — I498 Other specified cardiac arrhythmias: Secondary | ICD-10-CM

## 2013-04-19 DIAGNOSIS — E43 Unspecified severe protein-calorie malnutrition: Secondary | ICD-10-CM | POA: Insufficient documentation

## 2013-04-19 DIAGNOSIS — I471 Supraventricular tachycardia: Secondary | ICD-10-CM | POA: Diagnosis present

## 2013-04-19 LAB — BASIC METABOLIC PANEL
BUN: 14 mg/dL (ref 6–23)
CO2: 32 mEq/L (ref 19–32)
Calcium: 9.5 mg/dL (ref 8.4–10.5)
Chloride: 98 mEq/L (ref 96–112)
Creatinine, Ser: 0.8 mg/dL (ref 0.50–1.10)
Glucose, Bld: 120 mg/dL — ABNORMAL HIGH (ref 70–99)

## 2013-04-19 LAB — TROPONIN I: Troponin I: <0.3 ng/mL (ref <0.30)

## 2013-04-19 MED ORDER — DILTIAZEM HCL 30 MG PO TABS
30.0000 mg | ORAL_TABLET | Freq: Three times a day (TID) | ORAL | Status: DC
  Administered 2013-04-19 – 2013-04-20 (×2): 30 mg via ORAL
  Filled 2013-04-19 (×5): qty 1

## 2013-04-19 MED ORDER — POTASSIUM CHLORIDE CRYS ER 20 MEQ PO TBCR
60.0000 meq | EXTENDED_RELEASE_TABLET | Freq: Three times a day (TID) | ORAL | Status: DC
  Administered 2013-04-19 – 2013-04-20 (×3): 60 meq via ORAL
  Filled 2013-04-19 (×5): qty 3

## 2013-04-19 MED ORDER — LORAZEPAM 1 MG PO TABS
1.0000 mg | ORAL_TABLET | Freq: Once | ORAL | Status: AC
  Administered 2013-04-19: 1 mg via ORAL
  Filled 2013-04-19: qty 1

## 2013-04-19 MED ORDER — METOPROLOL TARTRATE 12.5 MG HALF TABLET
12.5000 mg | ORAL_TABLET | Freq: Two times a day (BID) | ORAL | Status: DC
  Administered 2013-04-19: 12.5 mg via ORAL
  Filled 2013-04-19 (×2): qty 1

## 2013-04-19 NOTE — Progress Notes (Signed)
Patient complained of slight shortness of breath and mid chest tightness after a short walk, claimed to be 6/10 on a pain scale. In a few minutes, patients chest pain subsided as she claimed. HR has been sustaining at 120-140s. EKG showed SVT. BP is stable. Benedetto Coons NP was made aware, seen and evaluated patient. Cardizem 5mg  IV bolus given and ativan dose given earlier than scheduled. Will continue to monitor patient.

## 2013-04-19 NOTE — Progress Notes (Addendum)
1500-Report received from N. Broadnax,RN; no change in assessment. Alexandria [name]

## 2013-04-19 NOTE — Progress Notes (Signed)
[facility] PROGRESS NOTE  Alexandria [name] ZOX:[id number] DOB: 01/12/1953 DOA: 04/13/2013 PCP: Rogelia Boga, MD Medical Oncologist: Dr.Khan Radiation Oncologist: Dr. 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 She used to be on home oxygen 6 months back and then stopped by herself when she moved from [place] to [place]. She has a pulse ox at home and has been intermittently hypoxic in [place] and prior to this admission-83% in the ED. She denied chest pain or cough. In the ED, she received Lasix for fluid overload and became hypotensive which was resuscitated with IVF. Patient admitted for further management. SVT overnight 8/1  Assessment/Plan: 1. Dyspnea/acute on chronic hypoxic respiratory failure:  - Etiology multifactorial -vocal cord dysfunction, h/o multiple intubations -Acute on chronic diastolic CHF -Anxiety  All of the above likely contributing along with obesity/deconditioning etc, ? Reflux -continue  Lasix, incraese to PO 80mg  Q8, urine output good -SVT overnight, add Po metoprolol -negative 8L and weight down  -Echo: LVEF 65-70%. -Appreciate Pulmonary consult  -psychiatry evaluation completed, declines anti-depressant, since anxiety felt to be a significant contributor to her dyspnea -nutrition consult  2. Fluid overload/anasarca/possible acute on chronic diastolic CHF: Management per problem #1. Improving.  3. Anxiety disorder: Continue PRN Ativan. Psychiatric consultation completed, declines anti-depressants  4. Chronic pain:  continue home dose of MS Contin and when  necessary Dilaudid. Patient follows with pain M.D. OP.  5. Hypokalemia: Replete as needed and follow BMP.  6. Microcytic anemia: Stable.  7. Acute renal insufficiency: resolved. Monitor while on Lasix.  8. Breast cancer: Stage 1, Continue radiation treatment. Outpatient followup with oncology. Discussed with Dr. Evonnie Pat triple antibiotic cream for radiation induced skin changes of breasts..  9. SVT overnight requiring IV cardizem x1     -add low dose Po metorpolol  Ambulate, Pt/Ot eval  Code Status: Full Family Communication: None Disposition Plan: Home tomorrow if stable   Consultants:  Radiation oncology  Psychiatry-pending  Pulmonology  Procedures:  None  Antibiotics:  None   HPI/Subjective: Overnight with SVT, chest discomfort  And dyspnea then  Objective: Filed Vitals:   04/18/13 2138 04/18/13 2225 04/19/13 0500 04/19/13 1321  BP: 108/62 120/67 113/71 114/67  Pulse: 136 138 125 86  Temp:   98.2 F (36.8 C) 97.9 F (36.6 C)  TempSrc:   Oral Oral  Resp: 22  18 18   Height:      Weight:   95.9 kg (211 lb 6.7 oz)   SpO2: 95%  93% 99%    Intake/Output Summary (Last 24 hours) at 04/19/13 1738 Last data filed at 04/19/13 1725  Gross per 24 hour  Intake   1200 ml  Output   3400 ml  Net  -2200 ml   Filed Weights   04/15/13 0508 04/18/13 0501 04/19/13 0500  Weight: 95.7 kg (210 lb 15.7 oz) 95.9 kg (211 lb 6.7 oz) 95.9 kg (211 lb 6.7 oz)    Exam:   General exam: Obese. No obvious distress, anxious  Respiratory system: Slightly reduced breath sounds in the bases with occasional basal crackles. upper airway wheeze noted  Cardiovascular system: S1 & S2 heard, RRR. No JVD, murmurs, gallops, clicks. 3+ pitting bilateral leg  edema extending to groin and lower back-seems slightly better than yesterday..  Gastrointestinal system: Abdomen is nondistended, soft and nontender. Normal bowel sounds heard.  Central nervous system: Alert and oriented.  No focal neurological deficits.  Extremities: Symmetric 5 x 5 power, 1-2 plus edema.  Psychiatry: Anxious.   Data Reviewed: Basic Metabolic Panel:  Recent Labs Lab 04/15/13 1901 04/16/13 0410 04/17/13 0500 04/18/13 0455 04/19/13 0235  NA 136 140 139 139 139  K 3.2* 3.2* 3.6 3.2* 3.3*  CL 92* 97 97 98 98  CO2 36* 37* 35* 34* 32  GLUCOSE 123* 94 100* 113* 120*  BUN 30* 27* 22 18 14   CREATININE 0.82 0.85 0.84 0.82 0.80  CALCIUM 9.7 9.5 9.7 9.3 9.5   Liver Function Tests:  Recent Labs Lab 04/13/13 2025  AST 23  ALT 18  ALKPHOS 120*  BILITOT 0.3  PROT 7.2  ALBUMIN 3.5   No results found for this basename: LIPASE, AMYLASE,  in the last 168 hours No results found for this basename: AMMONIA,  in the last 168 hours CBC:  Recent Labs Lab 04/13/13 2025 04/14/13 0510 04/17/13 0500  WBC 6.0 4.0 3.6*  NEUTROABS 4.9  --   --   HGB 8.8* 9.4* 8.7*  HCT 27.3* 29.7* 28.2*  MCV 77.6* 77.3* 78.3  PLT 270 287 248   Cardiac Enzymes:  Recent Labs Lab 04/19/13 0235 04/19/13 1040 04/19/13 1630  TROPONINI <0.30 <0.30 <0.30   BNP (last 3 results)  Recent Labs  04/13/13 1925 04/15/13 0517  PROBNP 1218.0* 825.2*   CBG: No results found for this basename: GLUCAP,  in the last 168 hours  No results found for this or any previous visit (from the past 240 hour(s)).   Studies: No results found.   Additional labs:   Scheduled Meds: . enoxaparin (LOVENOX) injection  0.5 mg/kg Subcutaneous Q24H  . furosemide  80 mg Oral TID  . hydrocerin   Topical BID  . lubiprostone  24 mcg Oral BID WC  . metoprolol tartrate  12.5 mg Oral BID  . morphine  30 mg Oral BID  . multivitamin with minerals  1 tablet Oral q morning - 10a  . neomycin-bacitracin-polymyxin   Topical BID  . pantoprazole  40 mg Oral Daily  . potassium chloride  60 mEq Oral TID  . protein supplement  1 scoop Oral TID WC  . sodium chloride  3 mL Intravenous Q12H   Continuous Infusions:    Principal  Problem:   Acute-on-chronic respiratory failure Active Problems:   Malignant neoplasm of upper-outer quadrant of female breast   Chronic back pain   Hypokalemia   AKI (acute kidney injury)   Anemia   Acute on chronic diastolic CHF (congestive heart failure)   Anxiety state, unspecified   Anasarca   Vocal cord dysfunction   Protein-calorie malnutrition, severe    Time spent: 30 minutes    [facility]  [facility] Pager [phone].   If 8PM-8AM, please contact night-coverage at [email or web address], password [facility] 04/19/2013, 5:38 PM  LOS: 6 days

## 2013-04-19 NOTE — Progress Notes (Signed)
Follow up EKG showed sinus tachy with 1st degree heart block. HR at 110-120's, patient is resting in bed and asymptomatic. Alexandria [name] was made aware, No new orders made. Will continue to monitor patient.

## 2013-04-20 LAB — CBC
Hemoglobin: 8.9 g/dL — ABNORMAL LOW (ref 12.0–15.0)
MCV: 77.9 fL — ABNORMAL LOW (ref 78.0–100.0)
Platelets: 246 10*3/uL (ref 150–400)
RBC: 3.66 MIL/uL — ABNORMAL LOW (ref 3.87–5.11)
WBC: 3.7 10*3/uL — ABNORMAL LOW (ref 4.0–10.5)

## 2013-04-20 LAB — BASIC METABOLIC PANEL
CO2: 33 mEq/L — ABNORMAL HIGH (ref 19–32)
Chloride: 98 mEq/L (ref 96–112)
Creatinine, Ser: 0.84 mg/dL (ref 0.50–1.10)
Glucose, Bld: 110 mg/dL — ABNORMAL HIGH (ref 70–99)

## 2013-04-20 MED ORDER — FUROSEMIDE 80 MG PO TABS: 80.0000 mg | ORAL_TABLET | Freq: Two times a day (BID) | ORAL | Status: DC

## 2013-04-20 MED ORDER — LORAZEPAM 1 MG PO TABS: 1.0000 mg | ORAL_TABLET | Freq: Three times a day (TID) | ORAL | Status: DC | PRN

## 2013-04-20 MED ORDER — DILTIAZEM HCL ER COATED BEADS 120 MG PO CP24
120.0000 mg | ORAL_CAPSULE | Freq: Every day | ORAL | Status: DC
  Administered 2013-04-20: 120 mg via ORAL
  Filled 2013-04-20: qty 1

## 2013-04-20 MED ORDER — POTASSIUM CHLORIDE 20 MEQ PO PACK: 40.0000 meq | PACK | Freq: Two times a day (BID) | ORAL | Status: DC

## 2013-04-20 MED ORDER — POLYETHYLENE GLYCOL 3350 17 G PO PACK: 17.0000 g | PACK | Freq: Every day | ORAL | Status: DC | PRN

## 2013-04-20 MED ORDER — HEPARIN SOD (PORK) LOCK FLUSH 100 UNIT/ML IV SOLN
500.0000 [IU] | INTRAVENOUS | Status: AC | PRN
  Administered 2013-04-20: 500 [IU]

## 2013-04-20 MED ORDER — DILTIAZEM HCL ER COATED BEADS 120 MG PO CP24: 120.0000 mg | ORAL_CAPSULE | Freq: Every day | ORAL | Status: DC

## 2013-04-20 MED ORDER — LORAZEPAM 1 MG PO TABS
1.0000 mg | ORAL_TABLET | Freq: Once | ORAL | Status: AC
  Administered 2013-04-20: 1 mg via ORAL

## 2013-04-20 MED ORDER — LUBIPROSTONE 24 MCG PO CAPS: 24.0000 ug | ORAL_CAPSULE | Freq: Two times a day (BID) | ORAL | Status: DC

## 2013-04-20 NOTE — Discharge Summary (Signed)
Physician Discharge Summary  Alexandria [name] ZOX:[id number] DOB: 01/12/1953 DOA: 04/13/2013  PCP: Rogelia Boga, MD  Admit date: 04/13/2013 Discharge date: 04/20/2013  Time spent: 50 minutes  Recommendations for Outpatient Follow-up:  1. PCP in 1 week 2. Bmet in 1 week  Discharge Diagnoses:  Principal Problem:   Acute-on-chronic respiratory failure Active Problems:   Breast Cancer   Chronic back pain   Hypokalemia   AKI (acute kidney injury)   Anemia   Acute on chronic diastolic CHF (congestive heart failure)   Anxiety state, unspecified   Anasarca   Vocal cord dysfunction   Protein-calorie malnutrition, severe   SVT (supraventricular tachycardia)   Discharge Condition: stable  Diet recommendation: low sodium, heart healthy  Filed Weights   04/15/13 0508 04/18/13 0501 04/19/13 0500  Weight: 95.7 kg (210 lb 15.7 oz) 95.9 kg (211 lb 6.7 oz) 95.9 kg (211 lb 6.7 oz)    History of present illness:  Alexandria [name] is a 62 y.o. female This 62 year old female has gained over 80 pounds in the last 3 months due to edema from chemotherapy for breast cancer, she has several weeks of gradually worsening painful bilateral lower leg edema, she has gradually worsening generalized weakness and fatigue and shortness of breath for the last several weeks as well the worsening shortness of breath for last few days with hypoxia today with room air pulse oximetry 83% since she came to the ED for evaluation, she is no fever or altered mental status no chest pain no cough no abdominal pain vomiting or diarrhea or bloody stools, she takes 80 mg Lasix twice daily already and she uses inhalers but they're not helping today for shortness of breath and wheezing and fluid. In the ED she was initially given lasix for what was believed to be fluid overload and CHF, but unfortunately her CXR showed no pulmonary edema (just a large hiatal hernia previously known about), her breathing didn't really improve, and she  just became hypotensive with this, so they had to instead start her on IVF. BMP shows mild AKI with creatinine of 1.5 (1.0 baseline).   Hospital Course:  62 year old female, retired Insurance underwriter, breast cancer status post chemotherapy in [place], on radiation therapy, lifelong nonsmoker, anemia, chronic pain, obesity, documented COPD, history of PE, recently moved from [place] to the [place] area 4-5 weeks back, presented to the [facility] ED on 04/13/13 with complaints of weight gain, lower extremity swelling, generalized weakness, fatigue and dyspnea. She used to be on home oxygen 6 months back and then stopped by herself when she moved from [place] to [place]. She has a pulse ox at home and has been intermittently hypoxic in [place] and prior to this admission-83% in the ED. She denied chest pain or cough. In the ED, she received Lasix for fluid overload and became hypotensive which was resuscitated with IVF. Patient admitted for further management. She was diuresed with IV lasix, has had a lot of anxiety contributing to her symptoms, was seen by Pulmonary and Psychiatry in consultation.  Detailed course: 1. Dyspnea/acute on chronic hypoxic respiratory failure:  - Etiology multifactorial  a) Vocal cord dysfunction suspected due to upper airway wheezing, h/o multiple intubations  B) Acute on chronic diastolic CHF  C) Anxiety  All of the above likely contributing along with obesity/deconditioning etc, ? Reflux  -VQ scan with low probability of PE -Diuresed with IV lasix and then changed to PO, negative 11 litres, weaned off O2 at rest still some hypoxia with  activity. -discharged home on PO lasix and advised to monitor weight and take additional dose for rapid weight gain -advised to FU with PCP in 1 week  -SVT overnight on 8/1, was treated with IV metoprolol, but due to pt concern with side effects, depression etc, this was changed to cardizem -Echo: LVEF 65-70%.  -Was seen by  Pulmonary in consultation who agreed with diuresis and recommended psychiatric consultation -psychiatry evaluation completed, declined anti-depressant, taking Ativan PRN and Will follow up as outpatient with psychiatry -nutrition consultation completed for dietary modification  2. Fluid overload/anasarca/possible acute on chronic diastolic CHF: Management per problem #1. Improving. 3. Anxiety disorder: Continue PRN Ativan. Psychiatric consultation completed, declines anti-depressants at this time. 4. Chronic pain: continue home dose of MS Contin and when necessary Dilaudid. Patient follows with pain M.D. OP. 5. Hypokalemia: Replete as needed and check BMP in 1 week. 6. Microcytic anemia: Stable. 7. Acute renal insufficiency: resolved. Monitor while on Lasix. 8. Breast cancer: Stage 1, Continue radiation treatment. Outpatient followup with oncology. Discussed with Dr. Evonnie Pat triple antibiotic cream for radiation induced skin changes of breasts..  Consultants:  Radiation oncology  Psychiatry-pending  Pulmonology  2D ECHO Study Conclusions Left ventricle: The cavity size was normal. Systolic function was vigorous. The estimated ejection fraction was in the range of 65% to 70%. Wall motion was normal; there were no regional wall motion abnormalities   Consultations:  [facility]  Psychiatry  Discharge Exam: Filed Vitals:   04/19/13 0500 04/19/13 1321 04/19/13 2005 04/20/13 0652  BP: 113/71 114/67 122/78 121/64  Pulse: 125 86 94 88  Temp: 98.2 F (36.8 C) 97.9 F (36.6 C) 98.2 F (36.8 C) 98.3 F (36.8 C)  TempSrc: Oral Oral Oral Oral  Resp: 18 18 18 18   Height:      Weight: 95.9 kg (211 lb 6.7 oz)     SpO2: 93% 99% 96% 93%    General: AAOx3 Cardiovascular: S1S2/RRR Respiratory:CTAB  Discharge Instructions  Discharge Orders   Future Appointments Provider Department Dept Phone   04/28/2013 11:00 AM Alphonzo Cruise, PTA [facility] [phone]   04/30/2013 2:30 PM Alphonzo Cruise, PTA [facility] [phone]   05/02/2013 11:00 AM Alphonzo Cruise, PTA [facility] [phone]   05/16/2013 2:20 PM Lonie Peak, MD [facility] [phone]   06/06/2013 2:15 PM Victorino December, MD [facility] MEDICAL ONCOLOGY [phone]   Future Orders Complete By Expires     Diet - low sodium heart healthy  As directed     Increase activity slowly  As directed         Medication List    STOP taking these medications       losartan-hydrochlorothiazide 100-25 MG per tablet  Commonly known as:  HYZAAR      TAKE these medications       diltiazem 120 MG 24 hr capsule  Commonly known as:  CARDIZEM CD  Take 1 capsule (120 mg total) by mouth daily.     furosemide 80 MG tablet  Commonly known as:  LASIX  Take 1 tablet (80 mg total) by mouth 2 (two) times daily. TAke extra 80mg  tablet if incraesed swelling or rapid weight gain     HAIR/SKIN/NAILS PO  Take 1 tablet by mouth every morning.     hyaluronate sodium Gel  Apply 1 application topically 2 (two) times daily.     HYDROmorphone 4 MG tablet  Commonly known as:  DILAUDID  Take 4 mg by mouth every 3 (three) hours as needed for pain.     levalbuterol 45 MCG/ACT inhaler  Commonly known as:  XOPENEX HFA  Inhale 1-2 puffs into the lungs as needed for wheezing or shortness of breath.     lidocaine-prilocaine cream  Commonly known as:  EMLA  Apply 1 application topically as needed (port access).     LORazepam 1 MG tablet  Commonly known as:  ATIVAN  Take 1 tablet (1 mg total) by mouth every 8 (eight) hours as needed for anxiety.     lubiprostone 24 MCG capsule  Commonly known as:  AMITIZA  Take 1 capsule (24 mcg total) by mouth 2 (two) times daily with a meal.     morphine 30 MG 12 hr tablet  Commonly known as:  MS  CONTIN  Take 30 mg by mouth 2 (two) times daily.     multivitamin with minerals Tabs  Take 1 tablet by mouth every morning.     polyethylene glycol packet  Commonly known as:  MIRALAX / GLYCOLAX  Take 17 g by mouth daily as needed.     potassium chloride 20 MEQ packet  Commonly known as:  KLOR-CON  Take 40 mEq by mouth 2 (two) times daily.       Allergies  Allergen Reactions  . Contrast Media (Iodinated Diagnostic Agents) Anaphylaxis  . Albuterol   . Prednisone Other (See Comments)    Pt gets very agitated when she takes high doses of steroids       Follow-up Information   Call [facility]. (To schedule an appointment to see a psychiatrist)    Contact information:   [address] [place], [place] [address] [phone]      Follow up with PCP. Schedule an appointment as soon as possible for a visit in 1 week.      Follow up with Bmet/Lab In 1 week.       The results of significant diagnostics from this hospitalization (including imaging, microbiology, ancillary and laboratory) are listed below for reference.    Significant Diagnostic Studies: Dg Chest 2 View  04/13/2013   *RADIOLOGY REPORT*  Clinical Data: Shortness of breath.  CHEST - 2 VIEW  Comparison: CT of the chest on 10/02/2012.  Findings: Density at the right lung base which likely represents chronic scarring and atelectasis based on the prior CT.  Subtle early infiltrate cannot be excluded.  There is no evidence of pulmonary edema or pleural fluid. A large hiatal / paraesophageal hernia is again identified.  Heart size and mediastinal contours are within normal limits.  IMPRESSION: Right basilar density likely represents chronic scarring and atelectasis.  However, subtle infiltrate cannot be excluded.  There is a stable large hiatal / paraesophageal hernia.   Original Report Authenticated By: Irish Lack, M.D.   Nm Pulmonary Perf And Vent  04/14/2013   *RADIOLOGY REPORT*  Clinical Data: Dyspnea,  hypoxia, breast cancer  NM PULMONARY VENTILATION AND PERFUSION SCAN  Radiopharmaceutical: 5.5 mCi of Tc MAA and 44 mCi of Tc DTPA  Comparison: Chest x-ray 04/13/2013  Findings: Ventilation images shows no ventilation defects. Hiatal hernia.    No segmental perfusion defects are noted on perfusion images.  Chest again demonstrate no acute infiltrate or pulmonary edema. Large hiatal hernia is noted with air-fluid level.  Findings are low probability for pulmonary embolus.  IMPRESSION: Low probability for pulmonary embolus.   Original Report Authenticated By: Lang Snow  Pop, M.D.    Microbiology: No results found for this or any previous visit (from the past 240 hour(s)).   Labs: Basic Metabolic Panel:  Recent Labs Lab 04/16/13 0410 04/17/13 0500 04/18/13 0455 04/19/13 0235 04/20/13 0440  NA 140 139 139 139 137  K 3.2* 3.6 3.2* 3.3* 3.5  CL 97 97 98 98 98  CO2 37* 35* 34* 32 33*  GLUCOSE 94 100* 113* 120* 110*  BUN 27* 22 18 14 12   CREATININE 0.85 0.84 0.82 0.80 0.84  CALCIUM 9.5 9.7 9.3 9.5 9.5   Liver Function Tests:  Recent Labs Lab 04/13/13 2025  AST 23  ALT 18  ALKPHOS 120*  BILITOT 0.3  PROT 7.2  ALBUMIN 3.5   No results found for this basename: LIPASE, AMYLASE,  in the last 168 hours No results found for this basename: AMMONIA,  in the last 168 hours CBC:  Recent Labs Lab 04/13/13 2025 04/14/13 0510 04/17/13 0500 04/20/13 0440  WBC 6.0 4.0 3.6* 3.7*  NEUTROABS 4.9  --   --   --   HGB 8.8* 9.4* 8.7* 8.9*  HCT 27.3* 29.7* 28.2* 28.5*  MCV 77.6* 77.3* 78.3 77.9*  PLT 270 287 248 246   Cardiac Enzymes:  Recent Labs Lab 04/19/13 0235 04/19/13 1040 04/19/13 1630  TROPONINI <0.30 <0.30 <0.30   BNP: BNP (last 3 results)  Recent Labs  04/13/13 1925 04/15/13 0517  PROBNP 1218.0* 825.2*   CBG: No results found for this basename: GLUCAP,  in the last 168 hours     Signed:  Berthold Glace  [facility] 04/20/2013, 2:20 PM
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
## 2013-04-20 NOTE — Progress Notes (Signed)
Pt left facility at 1420 this day with her spouse at her side.  No c/o; alert and oriented. Discharge instructions/prescriptions given/explained with pt verbalizing understanding.

## 2013-04-20 NOTE — Progress Notes (Signed)
Patient has been refusing the use of SCD on BLE due to pain on her legs. Patient is up and down the hallway walking.

## 2013-04-21 ENCOUNTER — Other Ambulatory Visit (HOSPITAL_COMMUNITY): Payer: BC Managed Care – PPO

## 2013-04-21 ENCOUNTER — Encounter: Payer: BC Managed Care – PPO | Admitting: Physical Therapy

## 2013-04-21 ENCOUNTER — Telehealth: Payer: Self-pay | Admitting: Internal Medicine

## 2013-04-21 DIAGNOSIS — E43 Unspecified severe protein-calorie malnutrition: Secondary | ICD-10-CM | POA: Diagnosis not present

## 2013-04-21 DIAGNOSIS — I509 Heart failure, unspecified: Secondary | ICD-10-CM | POA: Diagnosis not present

## 2013-04-21 DIAGNOSIS — D509 Iron deficiency anemia, unspecified: Secondary | ICD-10-CM | POA: Diagnosis not present

## 2013-04-21 DIAGNOSIS — G8929 Other chronic pain: Secondary | ICD-10-CM | POA: Diagnosis not present

## 2013-04-21 DIAGNOSIS — E669 Obesity, unspecified: Secondary | ICD-10-CM | POA: Diagnosis not present

## 2013-04-21 DIAGNOSIS — C50419 Malignant neoplasm of upper-outer quadrant of unspecified female breast: Secondary | ICD-10-CM | POA: Diagnosis not present

## 2013-04-21 DIAGNOSIS — F341 Dysthymic disorder: Secondary | ICD-10-CM | POA: Diagnosis not present

## 2013-04-21 DIAGNOSIS — I5033 Acute on chronic diastolic (congestive) heart failure: Secondary | ICD-10-CM | POA: Diagnosis not present

## 2013-04-21 DIAGNOSIS — Z4800 Encounter for change or removal of nonsurgical wound dressing: Secondary | ICD-10-CM | POA: Diagnosis not present

## 2013-04-21 DIAGNOSIS — R609 Edema, unspecified: Secondary | ICD-10-CM | POA: Diagnosis not present

## 2013-04-21 NOTE — Progress Notes (Signed)
  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  She was troubled by multiple other medical issues, including profound anxiety, lower extremity edema, and dyspnea.  She was ruled out for a PE during an inpatient stay, and treated for diagnoses including acute on chronic respiratory failure and diastolic CHF.  Plan: The patient has completed radiation treatment. The patient will return to radiation oncology clinic for routine followup in one month. I advised them to call or return sooner if they have any questions or concerns related to their recovery or treatment.  -----------------------------------  Lonie Peak, MD

## 2013-04-21 NOTE — Telephone Encounter (Signed)
Pt is scheduled to see PCP 8/12 for post hospital follow-up.  She is due to have BMET drawn on 8/11.  Please advise if physician would like BMET or any additional labs drawn.

## 2013-04-23 NOTE — Telephone Encounter (Signed)
Please advise 

## 2013-04-23 NOTE — Telephone Encounter (Signed)
Bmet only

## 2013-04-23 NOTE — Telephone Encounter (Signed)
Spoke to Robinson at [facility] and told her only need BMET labs per Dr. Amador Cunas. Dois Davenport verbalized understanding and stated will put in orders.

## 2013-04-24 DIAGNOSIS — D509 Iron deficiency anemia, unspecified: Secondary | ICD-10-CM | POA: Diagnosis not present

## 2013-04-24 DIAGNOSIS — E43 Unspecified severe protein-calorie malnutrition: Secondary | ICD-10-CM | POA: Diagnosis not present

## 2013-04-24 DIAGNOSIS — I509 Heart failure, unspecified: Secondary | ICD-10-CM | POA: Diagnosis not present

## 2013-04-24 DIAGNOSIS — I5033 Acute on chronic diastolic (congestive) heart failure: Secondary | ICD-10-CM | POA: Diagnosis not present

## 2013-04-24 DIAGNOSIS — C50419 Malignant neoplasm of upper-outer quadrant of unspecified female breast: Secondary | ICD-10-CM | POA: Diagnosis not present

## 2013-04-24 DIAGNOSIS — F341 Dysthymic disorder: Secondary | ICD-10-CM | POA: Diagnosis not present

## 2013-04-25 ENCOUNTER — Encounter: Payer: BC Managed Care – PPO | Admitting: Physical Therapy

## 2013-04-25 DIAGNOSIS — I5033 Acute on chronic diastolic (congestive) heart failure: Secondary | ICD-10-CM | POA: Diagnosis not present

## 2013-04-25 DIAGNOSIS — F341 Dysthymic disorder: Secondary | ICD-10-CM | POA: Diagnosis not present

## 2013-04-25 DIAGNOSIS — C50419 Malignant neoplasm of upper-outer quadrant of unspecified female breast: Secondary | ICD-10-CM | POA: Diagnosis not present

## 2013-04-25 DIAGNOSIS — E43 Unspecified severe protein-calorie malnutrition: Secondary | ICD-10-CM | POA: Diagnosis not present

## 2013-04-25 DIAGNOSIS — I509 Heart failure, unspecified: Secondary | ICD-10-CM | POA: Diagnosis not present

## 2013-04-25 DIAGNOSIS — D509 Iron deficiency anemia, unspecified: Secondary | ICD-10-CM | POA: Diagnosis not present

## 2013-04-28 ENCOUNTER — Telehealth: Payer: Self-pay | Admitting: Internal Medicine

## 2013-04-28 ENCOUNTER — Ambulatory Visit: Payer: BC Managed Care – PPO

## 2013-04-28 DIAGNOSIS — I509 Heart failure, unspecified: Secondary | ICD-10-CM | POA: Diagnosis not present

## 2013-04-28 DIAGNOSIS — I5033 Acute on chronic diastolic (congestive) heart failure: Secondary | ICD-10-CM | POA: Diagnosis not present

## 2013-04-28 DIAGNOSIS — F341 Dysthymic disorder: Secondary | ICD-10-CM | POA: Diagnosis not present

## 2013-04-28 DIAGNOSIS — R197 Diarrhea, unspecified: Secondary | ICD-10-CM | POA: Diagnosis not present

## 2013-04-28 DIAGNOSIS — E43 Unspecified severe protein-calorie malnutrition: Secondary | ICD-10-CM | POA: Diagnosis not present

## 2013-04-28 DIAGNOSIS — D509 Iron deficiency anemia, unspecified: Secondary | ICD-10-CM | POA: Diagnosis not present

## 2013-04-28 DIAGNOSIS — C50419 Malignant neoplasm of upper-outer quadrant of unspecified female breast: Secondary | ICD-10-CM | POA: Diagnosis not present

## 2013-04-28 NOTE — Telephone Encounter (Signed)
RN from [facility] requesting order extension on wound care - 2x/wk for another 2wks. Also, pt has a lot of anxiety. Crying a lot, c/o of abd pain. RN feels it is all stemming from anxiety.  Please advise.

## 2013-04-29 ENCOUNTER — Ambulatory Visit (INDEPENDENT_AMBULATORY_CARE_PROVIDER_SITE_OTHER): Payer: Medicare Other | Admitting: Internal Medicine

## 2013-04-29 ENCOUNTER — Encounter: Payer: Self-pay | Admitting: Internal Medicine

## 2013-04-29 VITALS — BP 160/90 | HR 120 | Temp 98.8°F | Resp 20 | Wt 206.0 lb

## 2013-04-29 DIAGNOSIS — R600 Localized edema: Secondary | ICD-10-CM

## 2013-04-29 DIAGNOSIS — I5033 Acute on chronic diastolic (congestive) heart failure: Secondary | ICD-10-CM

## 2013-04-29 DIAGNOSIS — I509 Heart failure, unspecified: Secondary | ICD-10-CM

## 2013-04-29 DIAGNOSIS — R609 Edema, unspecified: Secondary | ICD-10-CM

## 2013-04-29 DIAGNOSIS — R1013 Epigastric pain: Secondary | ICD-10-CM

## 2013-04-29 DIAGNOSIS — I10 Essential (primary) hypertension: Secondary | ICD-10-CM | POA: Diagnosis not present

## 2013-04-29 DIAGNOSIS — E876 Hypokalemia: Secondary | ICD-10-CM | POA: Diagnosis not present

## 2013-04-29 DIAGNOSIS — M549 Dorsalgia, unspecified: Secondary | ICD-10-CM | POA: Diagnosis not present

## 2013-04-29 DIAGNOSIS — G8929 Other chronic pain: Secondary | ICD-10-CM

## 2013-04-29 NOTE — Patient Instructions (Addendum)
Limit your sodium (Salt) intake  Notify the office if any significant weight gain  Return in 2 weeks for followup  Daily weights   GI consult as discussed

## 2013-04-29 NOTE — Progress Notes (Signed)
Subjective:    Patient ID: Alexandria [name], female    DOB: 01/12/1953, 62 y.o.   MRN: [id number]  HPI  62 year old patient who is seen today following her hospital discharge on August 3. She was admitted for decompensated diastolic heart failure and respiratory insufficiency. She was discharged on home O2 which he takes sporadically. She was seen by pulmonary medicine during the hospital course. She is followed by pain management for chronic low back pain. Today she complains of abdominal pain. She states this also has been present for a number of years;  she is requesting referral.  She was seen earlier to establish with this practice and did not complain of abdominal pain at that time. She has a history of chronic constipation related to chronic narcotic use. Hospital course complicated by renal insufficiency and electrolyte abnormalities. Apparently advanced home care through electrolytes yesterday. Also during the hospitalization she was seen in consultation by psychiatry. The patient refused  antidepressant medication but does remain on lorazepam.  Hospital records reviewed  Past Medical History  Diagnosis Date  . 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 Iron deficiency anemia   . Status post chemotherapy     4 cycles of Taxotere and cytoxan  . Chronic back pain   . Hx of pulmonary embolus     During c-Section  . Neuropathy   . Obesity     Class 2  . COPD (chronic obstructive pulmonary disease)     History   Social History  . Marital Status: Married    Spouse Name: N/A    Number of Children: N/A  . Years of Education: N/A   Occupational History  . Not on file.   Social History Main Topics  . Smoking status: Never Smoker   . Smokeless tobacco: Never Used  . Alcohol Use: No  . Drug Use: No  . Sexually Active: Yes   Other Topics Concern  . Not on file   Social History  Narrative  . No narrative on file    Past Surgical History  Procedure Laterality Date  . Cesarean section    . Right breast lumpectomy      Family History  Problem Relation Age of Onset  . Parkinson's disease Mother     Allergies  Allergen Reactions  . Contrast Media (Iodinated Diagnostic Agents) Anaphylaxis  . Albuterol   . Prednisone Other (See Comments)    Pt gets very agitated when she takes high doses of steroids    Current Outpatient Prescriptions on File Prior to Visit  Medication Sig Dispense Refill  . diltiazem (CARDIZEM CD) 120 MG 24 hr capsule Take 1 capsule (120 mg total) by mouth daily.  30 capsule  0  . furosemide (LASIX) 80 MG tablet Take 1 tablet (80 mg total) by mouth 2 (two) times daily. TAke extra 80mg  tablet if incraesed swelling or rapid weight gain  60 tablet  0  . hyaluronate sodium (RADIAPLEXRX) GEL Apply 1 application topically 2 (two) times daily.       Marland Kitchen HYDROmorphone (DILAUDID) 4 MG tablet Take 4 mg by mouth every 3 (three) hours as needed for pain.      Marland Kitchen levalbuterol (XOPENEX HFA) 45 MCG/ACT inhaler Inhale 1-2 puffs into the lungs as needed for wheezing or shortness of breath.       . lidocaine-prilocaine (EMLA) cream Apply 1 application topically  as needed (port access).       . LORazepam (ATIVAN) 1 MG tablet Take 1 tablet (1 mg total) by mouth every 8 (eight) hours as needed for anxiety.  45 tablet  1  . lubiprostone (AMITIZA) 24 MCG capsule Take 1 capsule (24 mcg total) by mouth 2 (two) times daily with a meal.  60 capsule  0  . morphine (MS CONTIN) 30 MG 12 hr tablet Take 30 mg by mouth 2 (two) times daily.      . Multiple Vitamin (MULTIVITAMIN WITH MINERALS) TABS Take 1 tablet by mouth every morning.      . Multiple Vitamins-Minerals (HAIR/SKIN/NAILS PO) Take 1 tablet by mouth every morning.      . polyethylene glycol (MIRALAX / GLYCOLAX) packet Take 17 g by mouth daily as needed.  14 each  0  . potassium chloride (KLOR-CON) 20 MEQ packet Take  40 mEq by mouth 2 (two) times daily.  60 tablet  0   No current facility-administered medications on file prior to visit.    BP 160/90  Pulse 120  Temp(Src) 98.8 F (37.1 C) (Oral)  Resp 20  Wt 206 lb (93.441 kg)  BMI 32.26 kg/m2  SpO2 93%  LMP 01/30/2005       Review of Systems  Constitutional: Positive for fatigue.  HENT: Negative for hearing loss, congestion, sore throat, rhinorrhea, dental problem, sinus pressure and tinnitus.   Eyes: Negative for pain, discharge and visual disturbance.  Respiratory: Positive for shortness of breath and wheezing. Negative for cough.   Cardiovascular: Positive for leg swelling. Negative for chest pain and palpitations.  Gastrointestinal: Positive for abdominal pain and constipation. Negative for nausea, vomiting, diarrhea, blood in stool and abdominal distention.  Genitourinary: Negative for dysuria, urgency, frequency, hematuria, flank pain, vaginal bleeding, vaginal discharge, difficulty urinating, vaginal pain and pelvic pain.  Musculoskeletal: Positive for back pain. Negative for joint swelling, arthralgias and gait problem.  Skin: Negative for rash.  Neurological: Negative for dizziness, syncope, speech difficulty, weakness, numbness and headaches.  Hematological: Negative for adenopathy.  Psychiatric/Behavioral: Positive for behavioral problems and dysphoric mood. Negative for agitation. The patient is nervous/anxious.        Objective:   Physical Exam  Constitutional: She is oriented to person, place, and time. She appears well-developed and well-nourished. No distress.  Blood pressure 160/70 Weight 206 O2 saturation 93%  HENT:  Head: Normocephalic.  Right Ear: External ear normal.  Left Ear: External ear normal.  Mouth/Throat: Oropharynx is clear and moist.  Eyes: Conjunctivae and EOM are normal. Pupils are equal, round, and reactive to light.  Neck: Normal range of motion. Neck supple. No thyromegaly present.   Cardiovascular: Normal rate, regular rhythm, normal heart sounds and intact distal pulses.   Pulmonary/Chest: Effort normal and breath sounds normal.  Abdominal: Soft. Bowel sounds are normal. She exhibits no mass. There is no tenderness.  Surgical scar  Subjective tenderness in the midline. No guarding or rebound Bowel sounds active  Musculoskeletal: Normal range of motion. She exhibits edema.  +3 edema distal to the knees  Lymphadenopathy:    She has no cervical adenopathy.  Neurological: She is alert and oriented to person, place, and time.  Skin: Skin is warm and dry. No rash noted.  Psychiatric: She has a normal mood and affect. Her behavior is normal.          Assessment & Plan:   Abdominal pain. This appears to be chronic. Anasarca diagnosed during a recent inpatient stay.  Patient is requesting referral. We'll set up for GI consult Diastolic heart failure Chronic lower extremity edema Chronic low back pain  GI consult Followup 2 weeks with repeat lab Continue present diuretic dose (extra 80 mg daily for prominent weight gain) Low-salt diet recommended Daily weights recommended

## 2013-04-29 NOTE — Telephone Encounter (Signed)
Pt scheduled to see Dr. Amador Cunas today.

## 2013-04-30 ENCOUNTER — Telehealth: Payer: Self-pay | Admitting: Internal Medicine

## 2013-04-30 DIAGNOSIS — F410 Panic disorder [episodic paroxysmal anxiety] without agoraphobia: Secondary | ICD-10-CM | POA: Diagnosis not present

## 2013-04-30 NOTE — Telephone Encounter (Signed)
Pt does not need a referral for insurance purposes, but requested recommendation for a Dermatologist for a wart on L hand and a rash on her lower legs. I gave her: [facility]. She will call and make appt. Just fyi. Thanks.

## 2013-04-30 NOTE — Telephone Encounter (Signed)
[facility] Triage Call Report Triage Record Num: [id number] Operator: Alphonsa Overall Patient Name: Alexandria [name] Call Date & Time: 04/29/2013 5:04:32PM Patient Phone: [phone] PCP: Gordy Savers Patient Gender: Female PCP Fax : [phone] Patient DOB: 01/12/1953 [facility] Name: [facility] Reason for Call: [name] calling about abdominal pain. Onset x weeks. Office visit 04/29/13. Flairs up at times. Hydromorphone at 1200. Pt breathing fast and heavy while on the phone with RN-ongoing for pt with CHF-recent hospital stay. 94%oxygen sat at 1710 on 2L-improves upon rest. 12/10 pain level now. Pain worsening since office visit. Vomits x 1 episode 04/29/13. Per EPIC record pt to f/u with GI, con't dieuretic. Pt states pain unbearable. See ED immediately.Care advice given per Abdominal Pain Protocol. Pt declines ED evaluation, pt with breast cancer and treatment. Pt will f/u with MD or oncology in am. Protocol(s) Used: Abdominal Pain Recommended Outcome per Protocol: See ED Immediately Reason for Outcome: Unbearable abdominal/pelvic pain Care Advice: ~ Allow the patient to be in a position of comfort. ~ Another adult should drive. ~ Pain medication or laxatives should not be taken until symptoms are evaluated. Call EMS 911 if signs and symptoms of shock develop (such as unable to stand due to faintness, dizziness, or lightheadedness; new onset of confusion; slow to respond or difficult to awaken; skin is pale, gray, cool, or moist to touch; severe weakness; loss of consciousness). ~ Write down provider's name. List or place the following in a bag for transport with the patient: current prescription and/or nonprescription medications; alternative treatments, therapies and medications; and street drugs. ~ 08/12/

## 2013-05-01 DIAGNOSIS — M5137 Other intervertebral disc degeneration, lumbosacral region: Secondary | ICD-10-CM | POA: Diagnosis not present

## 2013-05-01 DIAGNOSIS — M1710 Unilateral primary osteoarthritis, unspecified knee: Secondary | ICD-10-CM | POA: Diagnosis not present

## 2013-05-01 DIAGNOSIS — G5790 Unspecified mononeuropathy of unspecified lower limb: Secondary | ICD-10-CM | POA: Diagnosis not present

## 2013-05-01 DIAGNOSIS — G894 Chronic pain syndrome: Secondary | ICD-10-CM | POA: Diagnosis not present

## 2013-05-05 ENCOUNTER — Telehealth: Payer: Self-pay | Admitting: Medical Oncology

## 2013-05-05 ENCOUNTER — Telehealth: Payer: Self-pay | Admitting: Internal Medicine

## 2013-05-05 NOTE — Telephone Encounter (Signed)
Pt lvmom stating she has some "health questions and concerns." Per med rec pt called Dr Vernon Prey office as well this am and was advised to go to ED. Return call to pt to f/u to confirm pt went to ED, lvmom for patient to call office should she have any questions or concerns.

## 2013-05-05 NOTE — Telephone Encounter (Signed)
[facility] Triage Call Report Triage Record Num: [id number] Operator: Lesli Albee Patient Name: Alexandria [name] Call Date & Time: 05/04/2013 1:01:38PM Patient Phone: [phone] PCP: Gordy Savers Patient Gender: Female PCP Fax : [phone] Patient DOB: 01/12/1953 [facility] Name[facility] Reason for Call: Caller: Madalene/Patient; PCP: Eleonore Chiquito (Family Practice > 55yrs old); CB#: [phone]; Call regarding Heart Rate 150 s; Pts heart rate is 157 and wont' come down. 2 weeks ago she was hospitalized and put on Crestor for high heart. She is also nauseated and dizzy. RN triaged per irregular heart beat and advised 911 per protocol. Pt refused. She will have her husband take her to the ED. Protocol(s) Used: Irregular Heartbeat Recommended Outcome per Protocol: Activate EMS 911 Reason for Outcome: New or worsening signs and symptoms that may indicate shock Care Advice: ~ IMMEDIATE ACTION An adult should stay with the patient, preferably one trained in CPR. If the person is not trained in CPR, then he or she should provide hands-only (compression-only) CPR as recommended by the American [employer] Association. ~ 08/

## 2013-05-06 ENCOUNTER — Telehealth: Payer: Self-pay | Admitting: Oncology

## 2013-05-06 ENCOUNTER — Telehealth: Payer: Self-pay | Admitting: Medical Oncology

## 2013-05-06 DIAGNOSIS — C50411 Malignant neoplasm of upper-outer quadrant of right female breast: Secondary | ICD-10-CM

## 2013-05-06 NOTE — Telephone Encounter (Signed)
Patient called reporting to be "feeling generally weak, nauseated, stomach pain which I'm not sure if it's from the hernias or what" patient asking "how long am I going to be feeling like this." Per med rec note patient had called Dr Vernon Prey office and was told to go to ED. Asked pt did she go to ED, patient stated she did not.   Patient with upcoming appt w/Dr Basilio Cairo 08/29, spoke with Clydie Braun in radiation to see if Dr Basilio Cairo can see pt sooner.   Reviewed with Augustin Schooling, NP. Per NP patient to be sched 05/07/13 @ 1:15 with labs prior to @ 12:45. Patient informed and confirmed appt.  POF/Onc tx sent, labs ordered.

## 2013-05-06 NOTE — Telephone Encounter (Signed)
, °

## 2013-05-06 NOTE — Telephone Encounter (Signed)
Received a call from [place], RN for Dr. Welton Flakes.  She received a call from Haywood Lasso reporting that she was having weakness, stomach pain and nausea.  She also reported having a high heart rate.  She called [facility] and was advised to go to the ER but did not go.  Hale Drone was wondering if her follow up appointment for Dr. Basilio Cairo for 05/16/2013 could be moved up.

## 2013-05-07 ENCOUNTER — Encounter: Payer: Self-pay | Admitting: Gastroenterology

## 2013-05-07 ENCOUNTER — Telehealth: Payer: Self-pay | Admitting: Internal Medicine

## 2013-05-07 ENCOUNTER — Telehealth: Payer: Self-pay | Admitting: *Deleted

## 2013-05-07 ENCOUNTER — Other Ambulatory Visit: Payer: Self-pay

## 2013-05-07 ENCOUNTER — Ambulatory Visit: Payer: Self-pay | Admitting: Adult Health

## 2013-05-07 ENCOUNTER — Telehealth: Payer: Self-pay | Admitting: Medical Oncology

## 2013-05-07 DIAGNOSIS — C50911 Malignant neoplasm of unspecified site of right female breast: Secondary | ICD-10-CM

## 2013-05-07 DIAGNOSIS — D509 Iron deficiency anemia, unspecified: Secondary | ICD-10-CM | POA: Diagnosis not present

## 2013-05-07 DIAGNOSIS — E43 Unspecified severe protein-calorie malnutrition: Secondary | ICD-10-CM | POA: Diagnosis not present

## 2013-05-07 DIAGNOSIS — I509 Heart failure, unspecified: Secondary | ICD-10-CM | POA: Diagnosis not present

## 2013-05-07 DIAGNOSIS — I5033 Acute on chronic diastolic (congestive) heart failure: Secondary | ICD-10-CM | POA: Diagnosis not present

## 2013-05-07 DIAGNOSIS — C50919 Malignant neoplasm of unspecified site of unspecified female breast: Secondary | ICD-10-CM

## 2013-05-07 DIAGNOSIS — C50419 Malignant neoplasm of upper-outer quadrant of unspecified female breast: Secondary | ICD-10-CM | POA: Diagnosis not present

## 2013-05-07 DIAGNOSIS — F341 Dysthymic disorder: Secondary | ICD-10-CM | POA: Diagnosis not present

## 2013-05-07 NOTE — Telephone Encounter (Signed)
Patient is requesting lab results and would like to know if [facility] can come to her house for nursing help

## 2013-05-07 NOTE — Telephone Encounter (Signed)
Patient called to cancel today's appt with lab and NP, states she is having a home health nurse and home PT visit today, also states that she had labs done with Dr Amador Cunas previously and just found out from his office. Patient also states "I'm feeling a little better." Advised patient should she start to feel symptoms again to please call her MD or in emergency to go to ED. Patient gave verbal understanding and expressed gratitude for "getting me an appointment on short notice."  NP informed of cancellation.

## 2013-05-07 NOTE — Telephone Encounter (Signed)
We have no recent lab available for review; apparently lab was drawn by [facility] one day prior to her office visit here but these results are not available  It was my impression the patient is already being followed by [facility]-okay to set up if needed

## 2013-05-07 NOTE — Telephone Encounter (Signed)
Spoke with patient and order placed

## 2013-05-07 NOTE — Telephone Encounter (Signed)
Pt would like to know how often home health has been approved for. Pt states a nurse just called and confirmed it has been approved.  Pt would ike to know if we have received the labs done at her home. They done/sent 8/11 but we had not received. Pt would like to inform her BPP has been running high. Not sure diltiazem (CARDIZEM CD) 120 MG 24 hr capsule is working as well as other med.

## 2013-05-07 NOTE — Telephone Encounter (Signed)
Physical therapist called to obtain a order for OT to evaluate the pt in her home. Please assist.

## 2013-05-08 ENCOUNTER — Telehealth: Payer: Self-pay | Admitting: Internal Medicine

## 2013-05-08 MED ORDER — POTASSIUM CHLORIDE 20 MEQ PO PACK: 40.0000 meq | PACK | Freq: Three times a day (TID) | ORAL | Status: DC

## 2013-05-08 MED ORDER — FUROSEMIDE 80 MG PO TABS: 80.0000 mg | ORAL_TABLET | Freq: Two times a day (BID) | ORAL | Status: DC

## 2013-05-08 NOTE — Telephone Encounter (Signed)
Spoke with patient.

## 2013-05-08 NOTE — Telephone Encounter (Signed)
ok 

## 2013-05-08 NOTE — Telephone Encounter (Signed)
Pt is requesting a refill for Lasix 80mg  and Klor-con .   Pt called pharmacy first, but since Rx's were prescribed by MD at hospital, pharmacy will not call Dr Kirtland Bouchard.   Pt states she is suppose to take Lasix BID and take an extra when she has weight gain of 1-2 pounds in a day.   Most days pt is having to take the extra tablet, therefore she has started taking an extra Klor-con as well due to leg cramps, which has helped.   Pt uses [facility] on [address].   Please call pt if any questions or problems - leave VM if unavailable.

## 2013-05-08 NOTE — Telephone Encounter (Signed)
Left message on machine with verbal order for OT

## 2013-05-08 NOTE — Telephone Encounter (Signed)
rov next week if blood pressure remains elevated

## 2013-05-08 NOTE — Telephone Encounter (Signed)
Okay to refill? 

## 2013-05-13 ENCOUNTER — Ambulatory Visit: Payer: Self-pay | Admitting: Internal Medicine

## 2013-05-13 NOTE — Progress Notes (Signed)
Electron Boost Treatment Planning Note Outpatient  Diagnosis: Breast Cancer  The patient's CT images from her free-breathing simulation were reviewed to plan her boost treatment to her Right breast  lumpectomy cavity.  The boost to the lumpectomy cavity will be delivered with 15 MeV electrons, with an en face field, and custom electron cut out block. Special Port plan approved.  10 Gy in 5 fractions has been prescribed to the 100% isodose line.  -----------------------------------  Lonie Peak, MD

## 2013-05-14 DIAGNOSIS — I5033 Acute on chronic diastolic (congestive) heart failure: Secondary | ICD-10-CM | POA: Diagnosis not present

## 2013-05-14 DIAGNOSIS — I509 Heart failure, unspecified: Secondary | ICD-10-CM | POA: Diagnosis not present

## 2013-05-14 DIAGNOSIS — F341 Dysthymic disorder: Secondary | ICD-10-CM | POA: Diagnosis not present

## 2013-05-14 DIAGNOSIS — E43 Unspecified severe protein-calorie malnutrition: Secondary | ICD-10-CM | POA: Diagnosis not present

## 2013-05-14 DIAGNOSIS — C50419 Malignant neoplasm of upper-outer quadrant of unspecified female breast: Secondary | ICD-10-CM | POA: Diagnosis not present

## 2013-05-14 DIAGNOSIS — D509 Iron deficiency anemia, unspecified: Secondary | ICD-10-CM | POA: Diagnosis not present

## 2013-05-15 ENCOUNTER — Ambulatory Visit (INDEPENDENT_AMBULATORY_CARE_PROVIDER_SITE_OTHER): Payer: Medicare Other | Admitting: Internal Medicine

## 2013-05-15 ENCOUNTER — Encounter: Payer: Self-pay | Admitting: Internal Medicine

## 2013-05-15 ENCOUNTER — Encounter: Payer: Self-pay | Admitting: Radiation Oncology

## 2013-05-15 VITALS — BP 140/80 | HR 82 | Temp 98.3°F | Resp 20 | Wt 201.0 lb

## 2013-05-15 DIAGNOSIS — D649 Anemia, unspecified: Secondary | ICD-10-CM | POA: Diagnosis not present

## 2013-05-15 DIAGNOSIS — R609 Edema, unspecified: Secondary | ICD-10-CM

## 2013-05-15 DIAGNOSIS — F4323 Adjustment disorder with mixed anxiety and depressed mood: Secondary | ICD-10-CM | POA: Diagnosis not present

## 2013-05-15 DIAGNOSIS — K5900 Constipation, unspecified: Secondary | ICD-10-CM

## 2013-05-15 DIAGNOSIS — I5033 Acute on chronic diastolic (congestive) heart failure: Secondary | ICD-10-CM | POA: Diagnosis not present

## 2013-05-15 DIAGNOSIS — R600 Localized edema: Secondary | ICD-10-CM

## 2013-05-15 DIAGNOSIS — I509 Heart failure, unspecified: Secondary | ICD-10-CM

## 2013-05-15 DIAGNOSIS — K5909 Other constipation: Secondary | ICD-10-CM

## 2013-05-15 DIAGNOSIS — R601 Generalized edema: Secondary | ICD-10-CM

## 2013-05-15 DIAGNOSIS — J9620 Acute and chronic respiratory failure, unspecified whether with hypoxia or hypercapnia: Secondary | ICD-10-CM | POA: Diagnosis not present

## 2013-05-15 LAB — CBC WITH DIFFERENTIAL/PLATELET
Basophils Absolute: 0 10*3/uL (ref 0.0–0.1)
Eosinophils Absolute: 0 10*3/uL (ref 0.0–0.7)
Hemoglobin: 9.4 g/dL — ABNORMAL LOW (ref 12.0–15.0)
Lymphocytes Relative: 19.9 % (ref 12.0–46.0)
Monocytes Relative: 9.8 % (ref 3.0–12.0)
Neutro Abs: 2.6 10*3/uL (ref 1.4–7.7)
Neutrophils Relative %: 69.9 % (ref 43.0–77.0)
RDW: 19.4 % — ABNORMAL HIGH (ref 11.5–14.6)

## 2013-05-15 NOTE — Progress Notes (Signed)
Subjective:    Patient ID: Alexandria [name], female    DOB: October 06, 1950, 62 y.o.   MRN: [id number]  HPI  62 year old patient who is seen today for followup. She was hospitalized recently for decompensated diastolic heart failure with respiratory insufficiency. She has chronic lower extremity edema that has modestly improved since her last visit here 2 weeks ago. There's been some modest weight loss. She is receiving home physical therapy and occupational therapy. She is scheduled for a followup oncology visit stand. She has a history of anxiety depression and is followed by psychiatry. She has chronic pain and is also being managed by pain management   Past Medical History  Diagnosis Date  . 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 Iron deficiency anemia   . Status post chemotherapy     4 cycles of Taxotere and cytoxan  . Chronic back pain   . Hx of pulmonary embolus     During c-Section  . Neuropathy   . Obesity     Class 2  . COPD (chronic obstructive pulmonary disease)     History   Social History  . Marital Status: Married    Spouse Name: N/A    Number of Children: N/A  . Years of Education: N/A   Occupational History  . Not on file.   Social History Main Topics  . Smoking status: Never Smoker   . Smokeless tobacco: Never Used  . Alcohol Use: No  . Drug Use: No  . Sexual Activity: Yes   Other Topics Concern  . Not on file   Social History Narrative  . No narrative on file    Past Surgical History  Procedure Laterality Date  . Cesarean section    . Right breast lumpectomy      Family History  Problem Relation Age of Onset  . Parkinson's disease Mother     Allergies  Allergen Reactions  . Contrast Media [Iodinated Diagnostic Agents] Anaphylaxis  . Albuterol   . Prednisone Other (See Comments)    Pt gets very agitated when she takes high doses of steroids     Current Outpatient Prescriptions on File Prior to Visit  Medication Sig Dispense Refill  . diltiazem (CARDIZEM CD) 120 MG 24 hr capsule Take 1 capsule (120 mg total) by mouth daily.  30 capsule  0  . furosemide (LASIX) 80 MG tablet Take 1 tablet (80 mg total) by mouth 2 (two) times daily. TAke extra 80mg  tablet if incraesed swelling or rapid weight gain  90 tablet  3  . hyaluronate sodium (RADIAPLEXRX) GEL Apply 1 application topically 2 (two) times daily.       Marland Kitchen HYDROmorphone (DILAUDID) 4 MG tablet Take 4 mg by mouth every 3 (three) hours as needed for pain.      Marland Kitchen levalbuterol (XOPENEX HFA) 45 MCG/ACT inhaler Inhale 1-2 puffs into the lungs as needed for wheezing or shortness of breath.       . lidocaine-prilocaine (EMLA) cream Apply 1 application topically as needed (port access).       . LORazepam (ATIVAN) 1 MG tablet Take 1 tablet (1 mg total) by mouth every 8 (eight) hours as needed for anxiety.  45 tablet  1  . lubiprostone (AMITIZA) 24 MCG capsule Take 1 capsule (24 mcg total) by mouth 2 (two) times daily with a meal.  60 capsule  0  .  morphine (MS CONTIN) 30 MG 12 hr tablet Take 30 mg by mouth 2 (two) times daily.      . Multiple Vitamin (MULTIVITAMIN WITH MINERALS) TABS Take 1 tablet by mouth every morning.      . Multiple Vitamins-Minerals (HAIR/SKIN/NAILS PO) Take 1 tablet by mouth every morning.      . polyethylene glycol (MIRALAX / GLYCOLAX) packet Take 17 g by mouth daily as needed.  14 each  0  . potassium chloride (KLOR-CON) 20 MEQ packet Take 40 mEq by mouth 3 (three) times daily.  90 tablet  3   No current facility-administered medications on file prior to visit.    BP 140/80  Pulse 82  Temp(Src) 98.3 F (36.8 C) (Oral)  Resp 20  Wt 201 lb (91.173 kg)  BMI 31.47 kg/m2  SpO2 97%  LMP 01/30/2005     Wt Readings from Last 3 Encounters:  05/15/13 201 lb (91.173 kg)  04/29/13 206 lb (93.441 kg)  04/19/13 211 lb 6.7 oz (95.9 kg)    Review of Systems   Constitutional: Negative.   HENT: Negative for hearing loss, congestion, sore throat, rhinorrhea, dental problem, sinus pressure and tinnitus.   Eyes: Negative for pain, discharge and visual disturbance.  Respiratory: Negative for cough and shortness of breath.   Cardiovascular: Positive for leg swelling. Negative for chest pain and palpitations.  Gastrointestinal: Positive for abdominal pain. Negative for nausea, vomiting, diarrhea, constipation, blood in stool and abdominal distention.  Genitourinary: Negative for dysuria, urgency, frequency, hematuria, flank pain, vaginal bleeding, vaginal discharge, difficulty urinating, vaginal pain and pelvic pain.  Musculoskeletal: Negative for joint swelling, arthralgias and gait problem.  Skin: Negative for rash.  Neurological: Negative for dizziness, syncope, speech difficulty, weakness, numbness and headaches.  Hematological: Negative for adenopathy.  Psychiatric/Behavioral: Negative for behavioral problems, dysphoric mood and agitation. The patient is nervous/anxious.        Objective:   Physical Exam  Constitutional: She is oriented to person, place, and time. She appears well-developed and well-nourished.  HENT:  Head: Normocephalic.  Right Ear: External ear normal.  Left Ear: External ear normal.  Mouth/Throat: Oropharynx is clear and moist.  Eyes: Conjunctivae and EOM are normal. Pupils are equal, round, and reactive to light.  Neck: Normal range of motion. Neck supple. No thyromegaly present.  Cardiovascular: Normal rate, regular rhythm, normal heart sounds and intact distal pulses.   Breath sounds slight diminished at the bases left greater than the right. Few scattered rhonchi  Pulmonary/Chest: Effort normal and breath sounds normal.  Abdominal: Soft. Bowel sounds are normal. She exhibits no mass. There is no tenderness.  Musculoskeletal: Normal range of motion. She exhibits edema.  Prominent lower extremity edema left greater than  the right  Lymphadenopathy:    She has no cervical adenopathy.  Neurological: She is alert and oriented to person, place, and time.  Skin: Skin is warm and dry. No rash noted.  Psychiatric: Her behavior is normal.  Anxious emotional          Assessment & Plan:   Diastolic heart failure. This has improved. Her pulmonary status is better weight is down and there is some modest improvement in the peripheral edema Anxiety depression. Followup psychiatry Breast cancer followup oncology Hypertension stable Chronic pain syndrome   Will check electrolytes today. Presently she is on a regimen of 160 mg of furosemide and takes an extra 80 mg for additional fluid control

## 2013-05-15 NOTE — Patient Instructions (Signed)
Limit your sodium (Salt) intake  Keep legs elevated as much as possible  Return in 2 months for followup

## 2013-05-16 ENCOUNTER — Encounter: Payer: Self-pay | Admitting: Radiation Oncology

## 2013-05-16 ENCOUNTER — Telehealth: Payer: Self-pay | Admitting: Oncology

## 2013-05-16 ENCOUNTER — Telehealth: Payer: Self-pay | Admitting: Internal Medicine

## 2013-05-16 ENCOUNTER — Ambulatory Visit: Admit: 2013-05-16 | Payer: Medicare Other | Admitting: Radiation Oncology

## 2013-05-16 ENCOUNTER — Encounter: Payer: Self-pay | Admitting: Oncology

## 2013-05-16 ENCOUNTER — Ambulatory Visit (HOSPITAL_BASED_OUTPATIENT_CLINIC_OR_DEPARTMENT_OTHER): Payer: BC Managed Care – PPO | Admitting: Oncology

## 2013-05-16 VITALS — BP 119/76 | HR 80 | Temp 97.8°F | Resp 20 | Ht 67.0 in | Wt 204.7 lb

## 2013-05-16 DIAGNOSIS — Z9229 Personal history of other drug therapy: Secondary | ICD-10-CM | POA: Diagnosis not present

## 2013-05-16 DIAGNOSIS — C50419 Malignant neoplasm of upper-outer quadrant of unspecified female breast: Secondary | ICD-10-CM

## 2013-05-16 DIAGNOSIS — Z923 Personal history of irradiation: Secondary | ICD-10-CM | POA: Diagnosis not present

## 2013-05-16 DIAGNOSIS — C50411 Malignant neoplasm of upper-outer quadrant of right female breast: Secondary | ICD-10-CM

## 2013-05-16 DIAGNOSIS — M810 Age-related osteoporosis without current pathological fracture: Secondary | ICD-10-CM

## 2013-05-16 DIAGNOSIS — Z170 Estrogen receptor positive status [ER+]: Secondary | ICD-10-CM | POA: Diagnosis not present

## 2013-05-16 HISTORY — DX: Personal history of irradiation: Z92.3

## 2013-05-16 HISTORY — DX: Unspecified diastolic (congestive) heart failure: I50.30

## 2013-05-16 LAB — BASIC METABOLIC PANEL
Calcium: 9.2 mg/dL (ref 8.4–10.5)
Creatinine, Ser: 0.8 mg/dL (ref 0.4–1.2)
GFR: 75.49 mL/min (ref >60.00)
Glucose, Bld: 118 mg/dL — ABNORMAL HIGH (ref 70–99)
Sodium: 137 mEq/L (ref 135–145)

## 2013-05-16 MED ORDER — ANASTROZOLE 1 MG PO TABS: 1.0000 mg | ORAL_TABLET | Freq: Every day | ORAL | Status: AC

## 2013-05-16 MED ORDER — LORAZEPAM 1 MG PO TABS: 1.0000 mg | ORAL_TABLET | Freq: Three times a day (TID) | ORAL | Status: DC | PRN

## 2013-05-16 MED ORDER — LUBIPROSTONE 24 MCG PO CAPS: 24.0000 ug | ORAL_CAPSULE | Freq: Two times a day (BID) | ORAL | Status: DC

## 2013-05-16 NOTE — Patient Instructions (Addendum)
Proceed with arimidex 1 mg daily  We will order a bone density to evaluate for osteoporosis  I will see you back in 3 months  Anastrozole tablets What is this medicine? ANASTROZOLE (an AS troe zole) is used to treat breast cancer in women who have gone through menopause. Some types of breast cancer depend on estrogen to grow, and this medicine can stop tumor growth by blocking estrogen production. This medicine may be used for other purposes; ask your health care provider or pharmacist if you have questions. What should I tell my health care provider before I take this medicine? They need to know if you have any of these conditions: -liver disease -an unusual or allergic reaction to anastrozole, other medicines, foods, dyes, or preservatives -pregnant or trying to get pregnant -breast-feeding How should I use this medicine? Take this medicine by mouth with a glass of water. Follow the directions on the prescription label. You can take this medicine with or without food. Take your doses at regular intervals. Do not take your medicine more often than directed. Do not stop taking except on the advice of your doctor or health care professional. Talk to your pediatrician regarding the use of this medicine in children. Special care may be needed. Overdosage: If you think you have taken too much of this medicine contact a poison control center or emergency room at once. NOTE: This medicine is only for you. Do not share this medicine with others. What if I miss a dose? If you miss a dose, take it as soon as you can. If it is almost time for your next dose, take only that dose. Do not take double or extra doses. What may interact with this medicine? Do not take this medicine with any of the following medications: -female hormones, like estrogens or progestins and birth control pills This medicine may also interact with the following medications: -tamoxifen This list may not describe all possible  interactions. Give your health care provider a list of all the medicines, herbs, non-prescription drugs, or dietary supplements you use. Also tell them if you smoke, drink alcohol, or use illegal drugs. Some items may interact with your medicine. What should I watch for while using this medicine? Visit your doctor or health care professional for regular checks on your progress. Let your doctor or health care professional know about any unusual vaginal bleeding. Do not treat yourself for diarrhea, nausea, vomiting or other side effects. Ask your doctor or health care professional for advice. What side effects may I notice from receiving this medicine? Side effects that you should report to your doctor or health care professional as soon as possible: -allergic reactions like skin rash, itching or hives, swelling of the face, lips, or tongue -any new or unusual symptoms -breathing problems -chest pain -leg pain or swelling -vomiting Side effects that usually do not require medical attention (report to your doctor or health care professional if they continue or are bothersome): -back or bone pain -cough, or throat infection -diarrhea or constipation -dizziness -headache -hot flashes -loss of appetite -nausea -sweating -weakness and tiredness -weight gain This list may not describe all possible side effects. Call your doctor for medical advice about side effects. You may report side effects to FDA at 1-800-FDA-1088. Where should I keep my medicine? Keep out of the reach of children. Store at room temperature between 20 and 25 degrees C (68 and 77 degrees F). Throw away any unused medicine after the expiration date. NOTE: This  sheet is a summary. It may not cover all possible information. If you have questions about this medicine, talk to your doctor, pharmacist, or health care provider.  2013, [employer]. (11/15/2007 4:31:52 PM)

## 2013-05-16 NOTE — Telephone Encounter (Signed)
PT is calling to request a refill of her lubiprostone (AMITIZA) 24 MCG capsule be sent to [facility] on [address]. Please assist.

## 2013-05-16 NOTE — Telephone Encounter (Signed)
Rx sent to pharmacy   

## 2013-05-16 NOTE — Telephone Encounter (Signed)
, °

## 2013-05-21 ENCOUNTER — Encounter: Payer: Self-pay | Admitting: Physical Therapy

## 2013-05-22 ENCOUNTER — Ambulatory Visit: Payer: Self-pay | Admitting: Gastroenterology

## 2013-05-26 ENCOUNTER — Ambulatory Visit: Payer: Self-pay | Admitting: Internal Medicine

## 2013-05-27 ENCOUNTER — Ambulatory Visit: Payer: Medicare Other | Admitting: Radiation Oncology

## 2013-05-27 DIAGNOSIS — G894 Chronic pain syndrome: Secondary | ICD-10-CM | POA: Diagnosis not present

## 2013-05-27 DIAGNOSIS — M5137 Other intervertebral disc degeneration, lumbosacral region: Secondary | ICD-10-CM | POA: Diagnosis not present

## 2013-05-27 DIAGNOSIS — IMO0001 Reserved for inherently not codable concepts without codable children: Secondary | ICD-10-CM | POA: Diagnosis not present

## 2013-05-27 DIAGNOSIS — Z79899 Other long term (current) drug therapy: Secondary | ICD-10-CM | POA: Diagnosis not present

## 2013-05-28 ENCOUNTER — Telehealth: Payer: Self-pay | Admitting: Internal Medicine

## 2013-05-28 NOTE — Telephone Encounter (Signed)
PT from [facility] states that the pt has been extremely reluctant to cooperate with them. She states that the pt has cancelled and not returned phone called consistently since 05/14/13. They are now discharging her. FYI.

## 2013-05-30 ENCOUNTER — Ambulatory Visit: Payer: Medicare Other | Admitting: Radiation Oncology

## 2013-06-01 NOTE — Progress Notes (Signed)
OFFICE PROGRESS NOTE  CC**  Alexandria Boga, MD [address] [place] [address] Dr. 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 Patient was originally diagnosed in [place]   STAGE:  Malignant neoplasm of upper-outer quadrant of female breast  Primary site: Breast (Right)  Staging method: AJCC 7th Edition  Pathologic: Stage IA (T1, N0, cM0) signed by Victorino December, MD on 03/26/2013 8:21 AM  Summary: Stage IA (T1, N0, cM0)  ER positive, PR positive HER-2/neu negative  Oncotype DX recurrence score 23, 10 year risk intermediate  PRIOR THERAPY: #1January 2014 had a mammogram performed that showed an abnormality in the right breast. This was in [place]. 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 Patient went on to have a right lumpectomy performed revealing a T1 C. N0 disease stage at diagnosis stage I #2Oncotype DX performed that showed a recurrence score of 23 giving her a 10 year risk for recurrence but 10-30% in the intermediate risk category. Because of this she received adjuvant chemotherapy consisting of 4 cycles of Taxotere and Cytoxan  #3 patient is now status post radiation therapy to the  Right breast administered by Dr. Lonie Peak from 03/04/2013 through 04/17/2013.  #4 patient to begin antiestrogen therapy with curative intent consisting of Arimidex 1 mg daily for a total of 5 years. Risks benefits of this were discussed with the patient.  CURRENT THERAPY: Arimidex 1 mg daily starting 05/16/2013  INTERVAL HISTORY: Alexandria [name] 62 y.o. female returns for Followup visit after completion of radiation therapy. Overall she tolerated radiation well without any significant problems. She however did  develop some shortness of breath and required hospitalization in the middle of the radiation. She recovered from the radiation otherwise very nicely. She has no changes in her breast. She feels well she is somewhat fatigued. But she has no nausea or vomiting no peripheral paresthesias. Remainder of the 10 point review of systems is negative.  MEDICAL HISTORY: Past Medical History  Diagnosis Date  . 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 Iron deficiency anemia   . Status post chemotherapy     4 cycles of Taxotere and cytoxan  . Chronic back pain   . Hx of pulmonary embolus     During c-Section  . Neuropathy   . Obesity     Class 2  . COPD (chronic obstructive pulmonary disease)   . S/P radiation therapy  03/04/2013-04/17/2013    1) Right breast / 50 Gy in 25 fractions/ 2) Right breast boost / 10 Gy in 5 fractions  . Diastolic heart failure     ALLERGIES:  is allergic to contrast media; albuterol; and prednisone.  MEDICATIONS:  Current Outpatient Prescriptions  Medication Sig Dispense Refill  . anastrozole (ARIMIDEX) 1 MG tablet Take 1 tablet (1 mg total) by mouth daily.  90 tablet  12  . diltiazem (CARDIZEM CD) 120 MG 24 hr capsule Take 1 capsule (120 mg total) by mouth daily.  30 capsule  0  . furosemide (LASIX) 80 MG tablet Take 1 tablet (80 mg total) by mouth 2 (two) times daily. TAke extra 80mg  tablet if incraesed swelling or rapid weight  gain  90 tablet  3  . hyaluronate sodium (RADIAPLEXRX) GEL Apply 1 application topically 2 (two) times daily.       Marland Kitchen HYDROmorphone (DILAUDID) 4 MG tablet Take 4 mg by mouth every 3 (three) hours as needed for pain.      Marland Kitchen levalbuterol (XOPENEX HFA) 45 MCG/ACT inhaler Inhale 1-2 puffs into the lungs as needed for wheezing or shortness of breath.       . lidocaine-prilocaine (EMLA) cream Apply 1 application topically as needed (port access).        . LORazepam (ATIVAN) 1 MG tablet Take 1 tablet (1 mg total) by mouth every 8 (eight) hours as needed for anxiety.  45 tablet  1  . lubiprostone (AMITIZA) 24 MCG capsule Take 1 capsule (24 mcg total) by mouth 2 (two) times daily with a meal.  60 capsule  2  . morphine (MS CONTIN) 15 MG 12 hr tablet Take 15 mg by mouth 2 (two) times daily.      Marland Kitchen morphine (MS CONTIN) 30 MG 12 hr tablet Take 30 mg by mouth 2 (two) times daily.      . Multiple Vitamin (MULTIVITAMIN WITH MINERALS) TABS Take 1 tablet by mouth every morning.      . Multiple Vitamins-Minerals (HAIR/SKIN/NAILS PO) Take 1 tablet by mouth every morning.      . polyethylene glycol (MIRALAX / GLYCOLAX) packet Take 17 g by mouth daily as needed.  14 each  0  . potassium chloride (KLOR-CON) 20 MEQ packet Take 40 mEq by mouth 3 (three) times daily.  90 tablet  3   No current facility-administered medications for this visit.    SURGICAL HISTORY:  Past Surgical History  Procedure Laterality Date  . Cesarean section    . Right breast lumpectomy      REVIEW OF SYSTEMS:  Pertinent items are noted in HPI.   HEALTH MAINTENANCE:   PHYSICAL EXAMINATION: Blood pressure 119/76, pulse 80, temperature 97.8 F (36.6 C), temperature source Oral, resp. rate 20, height 5\' 7"  (1.702 m), weight 204 lb 11.2 oz (92.851 kg), last menstrual period 01/30/2005. Body mass index is 32.05 kg/(m^2). ECOG PERFORMANCE STATUS: 0 - Asymptomatic   General appearance: alert, cooperative and appears stated age Neck: no adenopathy, no carotid bruit, no JVD, supple, symmetrical, trachea midline and thyroid not enlarged, symmetric, no tenderness/mass/nodules Lymph nodes: Cervical, supraclavicular, and axillary nodes normal. Resp: clear to auscultation bilaterally Back: symmetric, no curvature. ROM normal. No CVA tenderness. Cardio: regular rate and rhythm, S1, S2 normal, no murmur, click, rub or gallop GI: soft, non-tender; bowel sounds normal; no masses,  no  organomegaly Extremities: extremities normal, atraumatic, no cyanosis or edema Neurologic: Grossly normal   LABORATORY DATA: Lab Results  Component Value Date   WBC 3.8* 05/15/2013   HGB 9.4* 05/15/2013   HCT 28.7* 05/15/2013   MCV 72.5* 05/15/2013   PLT 243.0 05/15/2013      Chemistry      Component Value Date/Time   NA 137 05/15/2013 1141   NA 142 03/27/2013 1612   K 4.1 05/15/2013 1141   K 3.7 03/27/2013 1612   CL 99 05/15/2013 1141   CL 99 03/04/2013 1239   CO2 33* 05/15/2013 1141   CO2 32* 03/27/2013 1612   BUN 20 05/15/2013 1141   BUN 15.2 03/27/2013 1612   CREATININE 0.8 05/15/2013 1141   CREATININE 0.9 03/27/2013 1612      Component Value Date/Time   CALCIUM 9.2 05/15/2013 1141  CALCIUM 9.6 03/27/2013 1612   ALKPHOS 120* 04/13/2013 2025   ALKPHOS 120 03/27/2013 1612   AST 23 04/13/2013 2025   AST 21 03/27/2013 1612   ALT 18 04/13/2013 2025   ALT 16 03/27/2013 1612   BILITOT 0.3 04/13/2013 2025   BILITOT 0.22 03/27/2013 1612       RADIOGRAPHIC STUDIES:  No results found.  ASSESSMENT: 55-year-old female with  #1 stage I invasive ductal carcinoma of the right breast status post lumpectomy followed by adjuvant chemotherapy consisting of 4 cycles of Taxotere and Cytoxan administered in [place].  #2 status post completion of radiation therapy administered by Dr. Lonie Peak to the right breast completed July 2014.  #3 patient to begin adjuvant antiestrogen therapy with Arimidex 1 mg daily. Risks and benefits and side effects of  Of a remedy axilla were explained to the patient in detail and literature was given to her.  #4 she will need a bone density scan and I have ordered this for her.   PLAN:   #1 patient will proceed with Arimidex 1 mg daily prescription was sent to her pharmacy.  #2 I have ordered a bone density scan to be done as soon as possible.  #3 I will see the patient back in 3 months time for followup and labs.   All questions were answered. The patient  knows to call the clinic with any problems, questions or concerns. We can certainly see the patient much sooner if necessary.  I spent 30 minutes counseling the patient face to face. The total time spent in the appointment was 30 minutes.    Drue Second, MD Medical/Oncology [facility] [phone] (beeper) [phone] (Office)

## 2013-06-03 DIAGNOSIS — F410 Panic disorder [episodic paroxysmal anxiety] without agoraphobia: Secondary | ICD-10-CM | POA: Diagnosis not present

## 2013-06-05 ENCOUNTER — Other Ambulatory Visit: Payer: Self-pay

## 2013-06-06 ENCOUNTER — Ambulatory Visit: Payer: BC Managed Care – PPO | Admitting: Oncology

## 2013-06-17 ENCOUNTER — Ambulatory Visit: Payer: BC Managed Care – PPO | Admitting: Radiation Oncology

## 2013-06-23 ENCOUNTER — Encounter: Payer: Self-pay | Admitting: Physical Therapy

## 2013-06-24 DIAGNOSIS — IMO0001 Reserved for inherently not codable concepts without codable children: Secondary | ICD-10-CM | POA: Diagnosis not present

## 2013-06-24 DIAGNOSIS — G894 Chronic pain syndrome: Secondary | ICD-10-CM | POA: Diagnosis not present

## 2013-06-24 DIAGNOSIS — M5137 Other intervertebral disc degeneration, lumbosacral region: Secondary | ICD-10-CM | POA: Diagnosis not present

## 2013-06-24 DIAGNOSIS — Z79899 Other long term (current) drug therapy: Secondary | ICD-10-CM | POA: Diagnosis not present

## 2013-06-30 ENCOUNTER — Ambulatory Visit: Payer: BC Managed Care – PPO | Attending: Surgery | Admitting: Physical Therapy

## 2013-06-30 DIAGNOSIS — M549 Dorsalgia, unspecified: Secondary | ICD-10-CM | POA: Insufficient documentation

## 2013-06-30 DIAGNOSIS — IMO0001 Reserved for inherently not codable concepts without codable children: Secondary | ICD-10-CM | POA: Insufficient documentation

## 2013-06-30 DIAGNOSIS — C50919 Malignant neoplasm of unspecified site of unspecified female breast: Secondary | ICD-10-CM | POA: Insufficient documentation

## 2013-06-30 DIAGNOSIS — IMO0002 Reserved for concepts with insufficient information to code with codable children: Secondary | ICD-10-CM | POA: Insufficient documentation

## 2013-06-30 DIAGNOSIS — M79609 Pain in unspecified limb: Secondary | ICD-10-CM | POA: Insufficient documentation

## 2013-06-30 DIAGNOSIS — F411 Generalized anxiety disorder: Secondary | ICD-10-CM | POA: Insufficient documentation

## 2013-06-30 DIAGNOSIS — R609 Edema, unspecified: Secondary | ICD-10-CM | POA: Insufficient documentation

## 2013-06-30 DIAGNOSIS — I10 Essential (primary) hypertension: Secondary | ICD-10-CM | POA: Insufficient documentation

## 2013-07-01 ENCOUNTER — Other Ambulatory Visit: Payer: Self-pay | Admitting: *Deleted

## 2013-07-01 ENCOUNTER — Telehealth: Payer: Self-pay | Admitting: *Deleted

## 2013-07-01 DIAGNOSIS — C50419 Malignant neoplasm of upper-outer quadrant of unspecified female breast: Secondary | ICD-10-CM

## 2013-07-01 DIAGNOSIS — C50919 Malignant neoplasm of unspecified site of unspecified female breast: Secondary | ICD-10-CM

## 2013-07-01 MED ORDER — SODIUM CHLORIDE 0.9 % IJ SOLN
10.0000 mL | Freq: Once | INTRAMUSCULAR | Status: DC
  Filled 2013-07-01: qty 10

## 2013-07-01 MED ORDER — HEPARIN SOD (PORK) LOCK FLUSH 100 UNIT/ML IV SOLN
500.0000 [IU] | Freq: Once | INTRAVENOUS | Status: DC
  Filled 2013-07-01: qty 5

## 2013-07-01 NOTE — Telephone Encounter (Signed)
Patient called reporting her port-a-cath hasn't been flushed in about three months.  Also asked how to get a surgeon to remove the port-a-cath.  Appears port was last used during April 13, 2013 admission.  Alexandria [name] says she needs a flush every six weeks because it is hard to access and get blood from her port.  Informed her we may need to use a larger needle.  Can come in on a Tuesday or Thursday afternoons.  Noted availability Tuesday July 08, 2013 at 2:00 pm and will send a request for  This day and time to scheduler.    Port-a-cath was placed by a surgeon in [place].  Will notify providers.  Next f/u is September 04, 2013 at 2:30 pm with Dr. Welton Flakes.

## 2013-07-01 NOTE — Telephone Encounter (Signed)
Per MD, okay for port to be removed. POF to scheduling. Called lmovm for pt to expect call from scheduling for port removal. In the meantime port can be flushed at schedule appt time or pt has option to cancel this appt and have pac removed. Requested pt to call back to confirm message received.

## 2013-07-01 NOTE — Telephone Encounter (Signed)
Pt called with concerns regarding PAC flush. Pt unavailable, lmovm pac flush scheduled for 10/21 at 2pm. Gave MD message about pac removal and will call her back with more information. Requested call back to confirm message rcvd.

## 2013-07-01 NOTE — Telephone Encounter (Signed)
Message copied by Cooper Render on Tue Jul 01, 2013  5:53 PM ------      Message from: Victorino December      Created: Tue Jul 01, 2013  5:32 PM      Regarding: RE: pac removal       Patient can have the port removed by IR      ----- Message -----         From: Jolinda Croak, RN         Sent: 07/01/2013   3:44 PM           To: Victorino December, MD, Illa Level, NP      Subject: pac removal                                              Pt called s/w Triage RN about PAC removal.       Last seen 05/16/13. Bone Density ordered. - no test results in system.             Radiation completed July 2014.             Pt on arimidex 1 mg daily. Next f/u 12/18.      Please advise thanks,      Renald Haithcock       ------

## 2013-07-02 ENCOUNTER — Ambulatory Visit: Payer: BC Managed Care – PPO

## 2013-07-04 ENCOUNTER — Ambulatory Visit: Payer: BC Managed Care – PPO

## 2013-07-07 ENCOUNTER — Ambulatory Visit: Payer: BC Managed Care – PPO | Admitting: Physical Therapy

## 2013-07-08 ENCOUNTER — Ambulatory Visit: Payer: Medicare Other | Admitting: Radiation Oncology

## 2013-07-09 ENCOUNTER — Ambulatory Visit: Payer: BC Managed Care – PPO

## 2013-07-14 ENCOUNTER — Encounter: Payer: Self-pay | Admitting: Physical Therapy

## 2013-07-16 ENCOUNTER — Ambulatory Visit: Payer: BC Managed Care – PPO

## 2013-07-17 ENCOUNTER — Ambulatory Visit: Payer: Self-pay | Admitting: Internal Medicine

## 2013-07-17 DIAGNOSIS — Z0289 Encounter for other administrative examinations: Secondary | ICD-10-CM

## 2013-07-21 ENCOUNTER — Ambulatory Visit: Payer: Medicare Other | Admitting: Physical Therapy

## 2013-07-21 DIAGNOSIS — R05 Cough: Secondary | ICD-10-CM | POA: Diagnosis not present

## 2013-07-21 DIAGNOSIS — R0609 Other forms of dyspnea: Secondary | ICD-10-CM | POA: Diagnosis not present

## 2013-07-21 DIAGNOSIS — R0602 Shortness of breath: Secondary | ICD-10-CM | POA: Diagnosis not present

## 2013-07-21 DIAGNOSIS — R509 Fever, unspecified: Secondary | ICD-10-CM | POA: Diagnosis not present

## 2013-07-21 DIAGNOSIS — J13 Pneumonia due to Streptococcus pneumoniae: Secondary | ICD-10-CM | POA: Diagnosis not present

## 2013-07-21 DIAGNOSIS — J189 Pneumonia, unspecified organism: Secondary | ICD-10-CM | POA: Diagnosis not present

## 2013-07-21 DIAGNOSIS — R0789 Other chest pain: Secondary | ICD-10-CM | POA: Diagnosis not present

## 2013-07-21 DIAGNOSIS — Z853 Personal history of malignant neoplasm of breast: Secondary | ICD-10-CM | POA: Diagnosis not present

## 2013-07-22 ENCOUNTER — Ambulatory Visit: Payer: Medicare Other | Admitting: Radiation Oncology

## 2013-07-23 DIAGNOSIS — J189 Pneumonia, unspecified organism: Secondary | ICD-10-CM | POA: Diagnosis not present

## 2013-07-23 DIAGNOSIS — C50919 Malignant neoplasm of unspecified site of unspecified female breast: Secondary | ICD-10-CM | POA: Diagnosis not present

## 2013-07-23 DIAGNOSIS — Z923 Personal history of irradiation: Secondary | ICD-10-CM | POA: Diagnosis not present

## 2013-07-23 DIAGNOSIS — G8929 Other chronic pain: Secondary | ICD-10-CM | POA: Diagnosis not present

## 2013-07-25 ENCOUNTER — Other Ambulatory Visit: Payer: Self-pay | Admitting: Radiology

## 2013-07-28 ENCOUNTER — Telehealth: Payer: Self-pay | Admitting: Internal Medicine

## 2013-07-28 ENCOUNTER — Ambulatory Visit (INDEPENDENT_AMBULATORY_CARE_PROVIDER_SITE_OTHER): Payer: Managed Care, Other (non HMO) | Admitting: Internal Medicine

## 2013-07-28 ENCOUNTER — Ambulatory Visit: Payer: Medicare Other | Admitting: Physical Therapy

## 2013-07-28 ENCOUNTER — Encounter: Payer: Self-pay | Admitting: Internal Medicine

## 2013-07-28 ENCOUNTER — Telehealth (HOSPITAL_COMMUNITY): Payer: Self-pay | Admitting: Radiology

## 2013-07-28 VITALS — BP 160/90 | HR 102 | Temp 98.0°F | Resp 20 | Wt 210.0 lb

## 2013-07-28 DIAGNOSIS — M549 Dorsalgia, unspecified: Secondary | ICD-10-CM

## 2013-07-28 DIAGNOSIS — I10 Essential (primary) hypertension: Secondary | ICD-10-CM

## 2013-07-28 DIAGNOSIS — Z23 Encounter for immunization: Secondary | ICD-10-CM

## 2013-07-28 DIAGNOSIS — J189 Pneumonia, unspecified organism: Secondary | ICD-10-CM

## 2013-07-28 DIAGNOSIS — G8929 Other chronic pain: Secondary | ICD-10-CM

## 2013-07-28 MED ORDER — GUAIFENESIN-CODEINE 100-10 MG/5ML PO SYRP: 5.0000 mL | ORAL_SOLUTION | Freq: Three times a day (TID) | ORAL | Status: DC | PRN

## 2013-07-28 NOTE — Progress Notes (Signed)
Subjective:    Patient ID: Alexandria [name], female    DOB: 1951-02-07, 62 y.o.   MRN: [id number]  HPI  62 year old patient who was diagnosed with community-acquired pneumonia while visiting in [place]. She has been treated with Augmentin and azithromycin. She continues to have cough in general much improved. No fever. Stable medical problems include chronic low back pain hypertension.  Past Medical History  Diagnosis Date  . 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 Iron deficiency anemia   . Status post chemotherapy     4 cycles of Taxotere and cytoxan  . Chronic back pain   . Hx of pulmonary embolus     During c-Section  . Neuropathy   . Obesity     Class 2  . COPD (chronic obstructive pulmonary disease)   . S/P radiation therapy  03/04/2013-04/17/2013    1) Right breast / 50 Gy in 25 fractions/ 2) Right breast boost / 10 Gy in 5 fractions  . Diastolic heart failure     History   Social History  . Marital Status: Married    Spouse Name: N/A    Number of Children: N/A  . Years of Education: N/A   Occupational History  . Not on file.   Social History Main Topics  . Smoking status: Never Smoker   . Smokeless tobacco: Never Used  . Alcohol Use: No  . Drug Use: No  . Sexual Activity: Yes   Other Topics Concern  . Not on file   Social History Narrative  . No narrative on file    Past Surgical History  Procedure Laterality Date  . Cesarean section    . Right breast lumpectomy      Family History  Problem Relation Age of Onset  . Parkinson's disease Mother     Allergies  Allergen Reactions  . Contrast Media [Iodinated Diagnostic Agents] Anaphylaxis  . Albuterol   . Prednisone Other (See Comments)    Pt gets very agitated when she takes high doses of steroids    Current Outpatient Prescriptions on File Prior to Visit  Medication Sig Dispense Refill  .  diltiazem (CARDIZEM CD) 120 MG 24 hr capsule Take 1 capsule (120 mg total) by mouth daily.  30 capsule  0  . furosemide (LASIX) 80 MG tablet Take 1 tablet (80 mg total) by mouth 2 (two) times daily. TAke extra 80mg  tablet if incraesed swelling or rapid weight gain  90 tablet  3  . hyaluronate sodium (RADIAPLEXRX) GEL Apply 1 application topically 2 (two) times daily.       Marland Kitchen HYDROmorphone (DILAUDID) 4 MG tablet Take 4 mg by mouth every 3 (three) hours as needed for pain.      Marland Kitchen levalbuterol (XOPENEX HFA) 45 MCG/ACT inhaler Inhale 1-2 puffs into the lungs as needed for wheezing or shortness of breath.       . lidocaine-prilocaine (EMLA) cream Apply 1 application topically as needed (port access).       . LORazepam (ATIVAN) 1 MG tablet Take 1 tablet (1 mg total) by mouth every 8 (eight) hours as needed for anxiety.  45 tablet  1  . lubiprostone (AMITIZA) 24 MCG capsule Take 1 capsule (24 mcg total) by mouth 2 (two) times daily with a meal.  60 capsule  2  . morphine (MS CONTIN) 15 MG 12 hr tablet Take 15 mg  by mouth 2 (two) times daily.      . Multiple Vitamin (MULTIVITAMIN WITH MINERALS) TABS Take 1 tablet by mouth every morning.      . Multiple Vitamins-Minerals (HAIR/SKIN/NAILS PO) Take 1 tablet by mouth every morning.      . polyethylene glycol (MIRALAX / GLYCOLAX) packet Take 17 g by mouth daily as needed.  14 each  0  . potassium chloride (KLOR-CON) 20 MEQ packet Take 40 mEq by mouth 3 (three) times daily.  90 tablet  3   No current facility-administered medications on file prior to visit.    BP 160/90  Pulse 102  Temp(Src) 98 F (36.7 C) (Oral)  Resp 20  Wt 210 lb (95.255 kg)  SpO2 95%  LMP 01/30/2005       Review of Systems  Constitutional: Negative.   HENT: Negative for congestion, dental problem, hearing loss, rhinorrhea, sinus pressure, sore throat and tinnitus.   Eyes: Negative for pain, discharge and visual disturbance.  Respiratory: Positive for cough. Negative for  shortness of breath.   Cardiovascular: Negative for chest pain, palpitations and leg swelling.  Gastrointestinal: Negative for nausea, vomiting, abdominal pain, diarrhea, constipation, blood in stool and abdominal distention.  Genitourinary: Negative for dysuria, urgency, frequency, hematuria, flank pain, vaginal bleeding, vaginal discharge, difficulty urinating, vaginal pain and pelvic pain.  Musculoskeletal: Positive for back pain. Negative for arthralgias, gait problem and joint swelling.  Skin: Negative for rash.  Neurological: Negative for dizziness, syncope, speech difficulty, weakness, numbness and headaches.  Hematological: Negative for adenopathy.  Psychiatric/Behavioral: Negative for behavioral problems, dysphoric mood and agitation. The patient is not nervous/anxious.        Objective:   Physical Exam  Constitutional: She is oriented to person, place, and time. She appears well-developed and well-nourished. No distress.  HENT:  Head: Normocephalic.  Right Ear: External ear normal.  Left Ear: External ear normal.  Mouth/Throat: Oropharynx is clear and moist.  Eyes: Conjunctivae and EOM are normal. Pupils are equal, round, and reactive to light.  Neck: Normal range of motion. Neck supple. No thyromegaly present.  Cardiovascular: Normal rate, regular rhythm, normal heart sounds and intact distal pulses.   Pulmonary/Chest: Effort normal and breath sounds normal. No respiratory distress. She has no wheezes. She has no rales.  Assessment is at the bases but clear. O2 saturation 95%  Abdominal: Soft. Bowel sounds are normal. She exhibits no mass. There is no tenderness.  Musculoskeletal: Normal range of motion.  Lymphadenopathy:    She has no cervical adenopathy.  Neurological: She is alert and oriented to person, place, and time.  Skin: Skin is warm and dry. No rash noted.  Psychiatric: She has a normal mood and affect. Her behavior is normal.          Assessment & Plan:    History of community acquired pneumonia. Patient has been fit with azithromycin and Augmentin. We'll continue symptomatic treatment with Robitussin-AC. Hypertension stable

## 2013-07-28 NOTE — Patient Instructions (Signed)
Take over-the-counter expectorants and cough medications such as  Mucinex DM.  Call if there is no improvement in 5 to 7 days or if he developed worsening cough, fever, or new symptoms, such as shortness of breath or chest pain. 

## 2013-07-28 NOTE — Telephone Encounter (Signed)
Pt would like to know WHEN/ or if she should have her port removed? Pt canceled it for tomorrow due to dx pneumonia.

## 2013-07-28 NOTE — Telephone Encounter (Signed)
LM stating that pt called to cancel port removal.  Pt states that she had pneumonia and feels worse this week than last week.  Patient will call to reschedule appointment once she feels better.

## 2013-07-28 NOTE — Telephone Encounter (Signed)
Left detailed message to call Oncologist in regards to port removal.

## 2013-07-29 ENCOUNTER — Inpatient Hospital Stay (HOSPITAL_COMMUNITY): Admission: RE | Admit: 2013-07-29 | Payer: Self-pay | Source: Ambulatory Visit

## 2013-07-29 ENCOUNTER — Ambulatory Visit (HOSPITAL_COMMUNITY): Admission: RE | Admit: 2013-07-29 | Payer: BC Managed Care – PPO | Source: Ambulatory Visit

## 2013-07-30 ENCOUNTER — Ambulatory Visit: Payer: Medicare Other

## 2013-08-01 ENCOUNTER — Encounter: Payer: Self-pay | Admitting: *Deleted

## 2013-08-01 DIAGNOSIS — Z923 Personal history of irradiation: Secondary | ICD-10-CM | POA: Insufficient documentation

## 2013-08-04 ENCOUNTER — Encounter: Payer: Self-pay | Admitting: Physical Therapy

## 2013-08-05 ENCOUNTER — Ambulatory Visit: Payer: Medicare Other | Admitting: Radiation Oncology

## 2013-08-11 DIAGNOSIS — G894 Chronic pain syndrome: Secondary | ICD-10-CM | POA: Diagnosis not present

## 2013-08-11 DIAGNOSIS — Z79899 Other long term (current) drug therapy: Secondary | ICD-10-CM | POA: Diagnosis not present

## 2013-08-11 DIAGNOSIS — IMO0001 Reserved for inherently not codable concepts without codable children: Secondary | ICD-10-CM | POA: Diagnosis not present

## 2013-08-11 DIAGNOSIS — M5137 Other intervertebral disc degeneration, lumbosacral region: Secondary | ICD-10-CM | POA: Diagnosis not present

## 2013-08-19 ENCOUNTER — Ambulatory Visit: Payer: Medicare Other | Attending: Radiation Oncology | Admitting: Radiation Oncology

## 2013-08-21 ENCOUNTER — Encounter: Payer: Self-pay | Admitting: *Deleted

## 2013-08-21 ENCOUNTER — Encounter: Payer: Self-pay | Admitting: Physical Therapy

## 2013-08-22 ENCOUNTER — Emergency Department (INDEPENDENT_AMBULATORY_CARE_PROVIDER_SITE_OTHER)
Admission: EM | Admit: 2013-08-22 | Discharge: 2013-08-22 | Disposition: A | Discharge status: Home / Self Care | Payer: Medicare Other | Source: Home / Self Care

## 2013-08-22 ENCOUNTER — Encounter (HOSPITAL_COMMUNITY): Payer: Self-pay | Admitting: Emergency Medicine

## 2013-08-22 DIAGNOSIS — J9801 Acute bronchospasm: Secondary | ICD-10-CM | POA: Diagnosis not present

## 2013-08-22 DIAGNOSIS — R0600 Dyspnea, unspecified: Secondary | ICD-10-CM

## 2013-08-22 DIAGNOSIS — R0609 Other forms of dyspnea: Secondary | ICD-10-CM | POA: Diagnosis not present

## 2013-08-22 NOTE — ED Notes (Signed)
Recent dx of double pneumonia.  Current cancer  Patient having chemo and radiation.  States" feels like i'm not getting any better"  Productive cough with a lot of yellow/green sputum. Wheezing and chest tightness.  Cough worse at night.  States cough syrup does help but is running out.

## 2013-08-22 NOTE — ED Provider Notes (Signed)
Medical screening examination/treatment/procedure(s) were performed by non-physician practitioner and as supervising physician I was immediately available for consultation/collaboration.  Leslee Home, M.D.  Reuben Likes, MD 08/22/13 2220

## 2013-08-22 NOTE — ED Provider Notes (Signed)
CSN: [id number]     Arrival date & time 08/22/13  1333 History   First MD Initiated Contact with Patient 08/22/13 1442     Chief Complaint  Patient presents with  . Follow-up   (Consider location/radiation/quality/duration/timing/severity/associated sxs/prior Treatment) HPI Comments: 62 year old female presenting with persistent cough. She has a history of pneumonia from 2 months ago in which she has been treated 3 times with antibiotics. She is also being treated with Xopenex. She has been using that approximately once per day and the last time she used it was yesterday.She is able to speak in complete sentences, relaxed posturing, able to stand and walk while talking on the telephone.  She has a history of COPD as per chart review that she has never smoked. Recent history of bilateral pneumonia in which she has been treated with 3 courses of antibiotics. Recent diagnosis and treatment of breast cancer, diastolic heart failure and and history of PE.   Past Medical History  Diagnosis Date  . 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 Iron deficiency anemia   . Status post chemotherapy     4 cycles of Taxotere and cytoxan  . Chronic back pain   . Hx of pulmonary embolus     During c-Section  . Neuropathy   . Obesity     Class 2  . COPD (chronic obstructive pulmonary disease)   . S/P radiation therapy  03/04/2013-04/17/2013    1) Right breast / 50 Gy in 25 fractions/ 2) Right breast boost / 10 Gy in 5 fractions  . Diastolic heart failure   . Hx of radiation therapy 03/04/13- 04/17/13    right breast 50 Gy 25 fractions, right breast boost 10 Gy 5 fractions   Past Surgical History  Procedure Laterality Date  . Cesarean section    . Right breast lumpectomy     Family History  Problem Relation Age of Onset  . Parkinson's disease Mother    History  Substance Use Topics  . Smoking  status: Never Smoker   . Smokeless tobacco: Never Used  . Alcohol Use: No   OB History   Grav Para Term Preterm Abortions TAB SAB Ect Mult Living                 Review of Systems  Constitutional: Positive for activity change. Negative for fever.  HENT: Negative for congestion, ear pain, postnasal drip, rhinorrhea and sore throat.   Respiratory: Positive for cough, shortness of breath and wheezing. Negative for apnea.   Cardiovascular: Negative for chest pain.       Lymphedema legs.  Gastrointestinal: Negative.   Musculoskeletal: Negative.   Skin: Negative for rash.  Neurological: Negative.     Allergies  Contrast media; Albuterol; and Prednisone  Home Medications   Current Outpatient Rx  Name  Route  Sig  Dispense  Refill  . diltiazem (CARDIZEM CD) 120 MG 24 hr capsule   Oral   Take 1 capsule (120 mg total) by mouth daily.   30 capsule   0   . furosemide (LASIX) 80 MG tablet   Oral   Take 1 tablet (80 mg total) by mouth 2 (two) times daily. TAke extra 80mg  tablet if incraesed swelling or rapid weight gain   90 tablet   3   . guaiFENesin-codeine (ROBITUSSIN AC) 100-10 MG/5ML syrup   Oral  Take 5 mLs by mouth 3 (three) times daily as needed for cough.   120 mL   0   . hyaluronate sodium (RADIAPLEXRX) GEL   Topical   Apply 1 application topically 2 (two) times daily.          Marland Kitchen levalbuterol (XOPENEX HFA) 45 MCG/ACT inhaler   Inhalation   Inhale 1-2 puffs into the lungs as needed for wheezing or shortness of breath.          Marland Kitchen LORazepam (ATIVAN) 1 MG tablet   Oral   Take 1 tablet (1 mg total) by mouth every 8 (eight) hours as needed for anxiety.   45 tablet   1   . lubiprostone (AMITIZA) 24 MCG capsule   Oral   Take 1 capsule (24 mcg total) by mouth 2 (two) times daily with a meal.   60 capsule   2   . Multiple Vitamin (MULTIVITAMIN WITH MINERALS) TABS   Oral   Take 1 tablet by mouth every morning.         . Multiple Vitamins-Minerals  (HAIR/SKIN/NAILS PO)   Oral   Take 1 tablet by mouth every morning.         . potassium chloride (KLOR-CON) 20 MEQ packet   Oral   Take 40 mEq by mouth 3 (three) times daily.   90 tablet   3   . HYDROmorphone (DILAUDID) 4 MG tablet   Oral   Take 4 mg by mouth every 3 (three) hours as needed for pain.         Marland Kitchen lidocaine-prilocaine (EMLA) cream   Topical   Apply 1 application topically as needed (port access).          . morphine (MS CONTIN) 15 MG 12 hr tablet   Oral   Take 15 mg by mouth 2 (two) times daily.         . polyethylene glycol (MIRALAX / GLYCOLAX) packet   Oral   Take 17 g by mouth daily as needed.   14 each   0    BP 134/74  Pulse 82  Temp(Src) 98.2 F (36.8 C) (Oral)  Resp 16  SpO2 98%  LMP 01/30/2005 Physical Exam  Nursing note and vitals reviewed. Constitutional: She is oriented to person, place, and time. She appears well-nourished. No distress.  HENT:  Mouth/Throat: Oropharynx is clear and moist. No oropharyngeal exudate.  Eyes: Conjunctivae and EOM are normal.  Neck: Normal range of motion. Neck supple.  Cardiovascular: Normal rate and regular rhythm.   Pulmonary/Chest:  Bilateral breath sounds are substantially diminished. Few wheezes bilaterally, decreased breath expansion.  Lymphadenopathy:    She has no cervical adenopathy.  Neurological: She is alert and oriented to person, place, and time.  Skin: Skin is warm and dry.  Psychiatric: She has a normal mood and affect.    ED Course  Procedures (including critical care time) Labs Review Labs Reviewed - No data to display Imaging Review No results found.    MDM   1. Dyspnea   2. Bronchospasm   3. Cough due to bronchospasm     The pathophysiology of her respiratory condition was explained and informed her bronchospasm is severe.  The patient has severe bronchospasm and is in need of a nebulized treatment. Had initially planned or albuterol however she states that she  cannot take that due to severe nervousness and jitteriness. The only medicine she can take is Xopenex. I told her we only have albuterol here  we could call the hospital and get it from the pharmacy. She decided at that time that she did not want any more treatment nor did she want an x-ray. She declined any further treatment. She did not show signs of dissatisfaction or anger. Patient was requested to stay for treatment and it is explained to her that she had severe bronchospasm causing her to cough and that she needed this to breathe better. The consequences if not improving airflow is worsening, lack of oxygen to the brain and other organs and death. S he the pathophysiologtyHe is also advised that if she goes home to use her Xopenex 2 puffs every 4 hours rather than waiting until she cannot breathing any air. She is to sign an AMA  Hayden Rasmussen, NP 08/22/13 [phone]

## 2013-08-25 ENCOUNTER — Ambulatory Visit: Payer: Managed Care, Other (non HMO) | Admitting: Internal Medicine

## 2013-08-25 ENCOUNTER — Telehealth: Payer: Self-pay | Admitting: Internal Medicine

## 2013-08-25 NOTE — Telephone Encounter (Signed)
Late entry:1:10 pm Pt showed up at the office stating someone told her to come by for Rx for Nebulizer Machine and Albuterol for the machine. Told pt I did not talk to her and we are not aware that you are using a nebulizer at home. Told pt I discussed with Dr.K he said to evaluate pt and if SOB needs to be seen. Pt said she can not wait long just needs new machine and medicine Told pt I will bring her back next, pt  SOB pulse ox 93-91%, pulse 111 and pt scheduled for appt. When I went to get pt at 1:40 pt said she can not stay will come back tomorrow to be seen. Asked pt if using Xopenex inhaler pt said yes but not helping. Gave pt Albuterol Inhaler and told her to use every 6 hours prn and make sure keep appt tomorrow at 1:30 pm. Pt verbalized understanding. Dr. Kirtland Bouchard made aware.

## 2013-08-25 NOTE — Telephone Encounter (Signed)
Patient Information:  Caller Name: Dawnetta  Phone: [phone]  Patient: Alexandria [name], Alexandria [name]  Gender: Female  DOB: [birth year]  Age: 62 Years  PCP: Eleonore Chiquito (Family Practice > 58yrs old)  Office Follow Up:  Does the office need to follow up with this patient?: Yes  Instructions For The Office: Yes please call and advise   Symptoms  Reason For Call & Symptoms: Pt is calling to say that the pt has SOB ongoing. She is telling the nurse that she has been SOB since October following double pneumonia. Pt was last in on 07/28/13. Pt insistent that Dr. Kirtland Bouchard has been following her for this and wondering if she needs to get a new MD. She advised the nurse she went to the ED on 08/22/13 - MD advised her she needed a nebulizer - she left without getting it because the wait was too long. Pt would prefer not to come in since this has been ongoing. She is asking if MD will order her a nebulizer with Albuterol solution to get today.  Reviewed Health History In EMR: Yes  Reviewed Medications In EMR: Yes  Reviewed Allergies In EMR: Yes  Reviewed Surgeries / Procedures: Yes  Date of Onset of Symptoms: 06/18/2013  Guideline(s) Used:  Breathing Difficulty  Disposition Per Guideline:   Go to ED Now  Reason For Disposition Reached:   Moderate difficulty breathing (e.g., speaks in phrases, SOB even at rest, pulse 100-120) of new onset or worse than normal  Advice Given:  N/A  Patient Refused Recommendation:  Patient Requests Prescription  Pt requests a nebulizer machine with nebulization be ordered ASAP

## 2013-08-26 ENCOUNTER — Ambulatory Visit: Payer: Medicare Other | Admitting: Internal Medicine

## 2013-08-26 ENCOUNTER — Encounter: Payer: Self-pay | Admitting: Internal Medicine

## 2013-08-26 ENCOUNTER — Ambulatory Visit (INDEPENDENT_AMBULATORY_CARE_PROVIDER_SITE_OTHER): Payer: Managed Care, Other (non HMO) | Admitting: Internal Medicine

## 2013-08-26 VITALS — BP 140/80 | HR 104 | Temp 97.4°F | Resp 22

## 2013-08-26 DIAGNOSIS — G8929 Other chronic pain: Secondary | ICD-10-CM

## 2013-08-26 DIAGNOSIS — C50911 Malignant neoplasm of unspecified site of right female breast: Secondary | ICD-10-CM

## 2013-08-26 DIAGNOSIS — C50919 Malignant neoplasm of unspecified site of unspecified female breast: Secondary | ICD-10-CM

## 2013-08-26 DIAGNOSIS — I10 Essential (primary) hypertension: Secondary | ICD-10-CM

## 2013-08-26 DIAGNOSIS — J9620 Acute and chronic respiratory failure, unspecified whether with hypoxia or hypercapnia: Secondary | ICD-10-CM

## 2013-08-26 DIAGNOSIS — M549 Dorsalgia, unspecified: Secondary | ICD-10-CM

## 2013-08-26 DIAGNOSIS — Z923 Personal history of irradiation: Secondary | ICD-10-CM

## 2013-08-26 MED ORDER — LEVALBUTEROL HCL 0.63 MG/3ML IN NEBU: 0.6300 mg | INHALATION_SOLUTION | RESPIRATORY_TRACT | Status: DC | PRN

## 2013-08-26 MED ORDER — BUDESONIDE 180 MCG/ACT IN AEPB: 2.0000 | INHALATION_SPRAY | Freq: Two times a day (BID) | RESPIRATORY_TRACT | Status: DC

## 2013-08-26 NOTE — Progress Notes (Signed)
Pre-visit discussion using our clinic review tool. No additional management support is needed unless otherwise documented below in the visit note.  

## 2013-08-26 NOTE — Patient Instructions (Signed)
Limit your sodium (Salt) intake  Return in one month for follow-up 

## 2013-08-26 NOTE — Progress Notes (Signed)
Subjective:    Patient ID: Alexandria [name], female    DOB: [birth year], 62 y.o.   MRN: [id number]  HPI  62 year old patient who is seen today with a chief complaint of increasing shortness of breath and wheezing. She is requesting a prescription for a nebulizer crit which has been quite helpful in the past.  Face-to-face evaluation was performed to assess the need for home and nebulizer treatments. She has been intolerant of albuterol do to increasing nervousness but has done well with Xopenex. She has had a complete symptom relief with hand-held nebulizer treatments.  Patient is a lifelong nonsmoker but does have a history of COPD as well as diastolic heart failure with acute on chronic respiratory failure. Treatment for breast cancer has included radiation therapy. She was treated for acne prior pneumonia in [place] a couple of months ago  She continues to have the wheezing and shortness of breath enlarged a nonproductive cough. She has been using expectorants. She is on chronic narcotics due to her chronic pain syndrome. She has been prescribed Proair  inhaler which has been mildly beneficial. Denies any fever or chills. No purulent sputum production  Past Medical History  Diagnosis Date  . 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 Iron deficiency anemia   . Status post chemotherapy     4 cycles of Taxotere and cytoxan  . Chronic back pain   . Hx of pulmonary embolus     During c-Section  . Neuropathy   . Obesity     Class 2  . COPD (chronic obstructive pulmonary disease)   . S/P radiation therapy  03/04/2013-04/17/2013    1) Right breast / 50 Gy in 25 fractions/ 2) Right breast boost / 10 Gy in 5 fractions  . Diastolic heart failure   . Hx of radiation therapy 03/04/13- 04/17/13    right breast 50 Gy 25 fractions, right breast boost 10 Gy 5 fractions    History   Social History  .  Marital Status: Married    Spouse Name: N/A    Number of Children: N/A  . Years of Education: N/A   Occupational History  . Not on file.   Social History Main Topics  . Smoking status: Never Smoker   . Smokeless tobacco: Never Used  . Alcohol Use: No  . Drug Use: No  . Sexual Activity: Yes   Other Topics Concern  . Not on file   Social History Narrative  . No narrative on file    Past Surgical History  Procedure Laterality Date  . Cesarean section    . Right breast lumpectomy      Family History  Problem Relation Age of Onset  . Parkinson's disease Mother     Allergies  Allergen Reactions  . Contrast Media [Iodinated Diagnostic Agents] Anaphylaxis  . Albuterol   . Prednisone Other (See Comments)    Pt gets very agitated when she takes high doses of steroids    Current Outpatient Prescriptions on File Prior to Visit  Medication Sig Dispense Refill  . diltiazem (CARDIZEM CD) 120 MG 24 hr capsule Take 1 capsule (120 mg total) by mouth daily.  30 capsule  0  . furosemide (LASIX) 80 MG tablet Take 1 tablet (80 mg total) by mouth 2 (two) times daily. TAke extra 80mg  tablet if incraesed swelling or rapid weight gain  90  tablet  3  . guaiFENesin-codeine (ROBITUSSIN AC) 100-10 MG/5ML syrup Take 5 mLs by mouth 3 (three) times daily as needed for cough.  120 mL  0  . hyaluronate sodium (RADIAPLEXRX) GEL Apply 1 application topically 2 (two) times daily as needed (port access).       Marland Kitchen HYDROmorphone (DILAUDID) 4 MG tablet Take 4 mg by mouth every 3 (three) hours as needed for pain.      Marland Kitchen levalbuterol (XOPENEX HFA) 45 MCG/ACT inhaler Inhale 1-2 puffs into the lungs as needed for wheezing or shortness of breath.       . lidocaine-prilocaine (EMLA) cream Apply 1 application topically as needed (port access).       . LORazepam (ATIVAN) 1 MG tablet Take 1 tablet (1 mg total) by mouth every 8 (eight) hours as needed for anxiety.  45 tablet  1  . lubiprostone (AMITIZA) 24 MCG capsule  Take 1 capsule (24 mcg total) by mouth 2 (two) times daily with a meal.  60 capsule  2  . Multiple Vitamin (MULTIVITAMIN WITH MINERALS) TABS Take 1 tablet by mouth every morning.      . Multiple Vitamins-Minerals (HAIR/SKIN/NAILS PO) Take 1 tablet by mouth every morning.      . potassium chloride (KLOR-CON) 20 MEQ packet Take 40 mEq by mouth 3 (three) times daily.  90 tablet  3   No current facility-administered medications on file prior to visit.    BP 140/80  Pulse 104  Temp(Src) 97.4 F (36.3 C) (Oral)  Resp 22  SpO2 93%  LMP 01/30/2005       Review of Systems  Constitutional: Negative.   HENT: Negative for congestion, dental problem, hearing loss, rhinorrhea, sinus pressure, sore throat and tinnitus.   Eyes: Negative for pain, discharge and visual disturbance.  Respiratory: Positive for shortness of breath and wheezing. Negative for cough.   Cardiovascular: Positive for leg swelling. Negative for chest pain and palpitations.  Gastrointestinal: Negative for nausea, vomiting, abdominal pain, diarrhea, constipation, blood in stool and abdominal distention.  Genitourinary: Negative for dysuria, urgency, frequency, hematuria, flank pain, vaginal bleeding, vaginal discharge, difficulty urinating, vaginal pain and pelvic pain.  Musculoskeletal: Positive for back pain. Negative for arthralgias, gait problem and joint swelling.  Skin: Negative for rash.  Neurological: Negative for dizziness, syncope, speech difficulty, weakness, numbness and headaches.  Hematological: Negative for adenopathy.  Psychiatric/Behavioral: Negative for behavioral problems, dysphoric mood and agitation. The patient is not nervous/anxious.        Objective:   Physical Exam  Constitutional: She is oriented to person, place, and time. She appears well-developed and well-nourished.  HENT:  Head: Normocephalic.  Right Ear: External ear normal.  Left Ear: External ear normal.  Mouth/Throat: Oropharynx is  clear and moist.  Eyes: Conjunctivae and EOM are normal. Pupils are equal, round, and reactive to light.  Neck: Normal range of motion. Neck supple. No thyromegaly present.  Cardiovascular: Normal rate, regular rhythm, normal heart sounds and intact distal pulses.   Pulmonary/Chest: Effort normal. No respiratory distress. She has wheezes.  Primarily inspiratory wheezing Oxygen saturation 93%  Abdominal: Soft. Bowel sounds are normal. She exhibits no mass. There is no tenderness.  Musculoskeletal: Normal range of motion. She exhibits edema.  Lower extremity edema left greater than the right  Lymphadenopathy:    She has no cervical adenopathy.  Neurological: She is alert and oriented to person, place, and time.  Skin: Skin is warm and dry. No rash noted.  Psychiatric: She has  a normal mood and affect. Her behavior is normal.          Assessment & Plan:   Exacerbation of COPD. We'll set up for home nebulizer treatments. Will also place on Pulmicort Hypertension History of COPD and acute on chronic respiratory failure Remote breast cancer status post radiation treatment

## 2013-08-27 ENCOUNTER — Ambulatory Visit: Payer: Medicare Other | Admitting: Internal Medicine

## 2013-08-29 ENCOUNTER — Telehealth: Payer: Self-pay | Admitting: Internal Medicine

## 2013-08-29 ENCOUNTER — Other Ambulatory Visit: Payer: Self-pay | Admitting: *Deleted

## 2013-08-29 MED ORDER — GUAIFENESIN-CODEINE 100-10 MG/5ML PO SYRP: 5.0000 mL | ORAL_SOLUTION | Freq: Three times a day (TID) | ORAL | Status: DC | PRN

## 2013-08-29 NOTE — Telephone Encounter (Signed)
RN tried to reach pt re: cough and congestion.  Left vm.

## 2013-08-29 NOTE — Telephone Encounter (Signed)
Pt came to office did not get refill on cough medicine when she was here the other day. Rx printed and signed by Dr. Clent Ridges and given to pt.

## 2013-09-01 ENCOUNTER — Ambulatory Visit: Payer: Managed Care, Other (non HMO) | Attending: Surgery | Admitting: Physical Therapy

## 2013-09-01 DIAGNOSIS — IMO0002 Reserved for concepts with insufficient information to code with codable children: Secondary | ICD-10-CM | POA: Insufficient documentation

## 2013-09-01 DIAGNOSIS — F411 Generalized anxiety disorder: Secondary | ICD-10-CM | POA: Insufficient documentation

## 2013-09-01 DIAGNOSIS — I10 Essential (primary) hypertension: Secondary | ICD-10-CM | POA: Insufficient documentation

## 2013-09-01 DIAGNOSIS — IMO0001 Reserved for inherently not codable concepts without codable children: Secondary | ICD-10-CM | POA: Insufficient documentation

## 2013-09-01 DIAGNOSIS — M549 Dorsalgia, unspecified: Secondary | ICD-10-CM | POA: Insufficient documentation

## 2013-09-01 DIAGNOSIS — M79609 Pain in unspecified limb: Secondary | ICD-10-CM | POA: Insufficient documentation

## 2013-09-01 DIAGNOSIS — R609 Edema, unspecified: Secondary | ICD-10-CM | POA: Insufficient documentation

## 2013-09-01 DIAGNOSIS — C50919 Malignant neoplasm of unspecified site of unspecified female breast: Secondary | ICD-10-CM | POA: Insufficient documentation

## 2013-09-02 ENCOUNTER — Encounter: Payer: Self-pay | Admitting: Physical Therapy

## 2013-09-02 ENCOUNTER — Telehealth: Payer: Self-pay | Admitting: Oncology

## 2013-09-02 ENCOUNTER — Encounter: Payer: Self-pay | Admitting: *Deleted

## 2013-09-02 DIAGNOSIS — F410 Panic disorder [episodic paroxysmal anxiety] without agoraphobia: Secondary | ICD-10-CM | POA: Diagnosis not present

## 2013-09-02 NOTE — Telephone Encounter (Signed)
, °

## 2013-09-03 ENCOUNTER — Ambulatory Visit: Payer: Managed Care, Other (non HMO) | Admitting: Physical Therapy

## 2013-09-04 ENCOUNTER — Ambulatory Visit: Payer: Self-pay | Admitting: Oncology

## 2013-09-04 ENCOUNTER — Telehealth: Payer: Self-pay | Admitting: Emergency Medicine

## 2013-09-04 ENCOUNTER — Other Ambulatory Visit: Payer: Self-pay

## 2013-09-04 NOTE — Telephone Encounter (Signed)
Patient called office and left message stating request for return call back regarding some questions she had. Patient did not elaborate as to what her questions were. Returned patient's call; no answer; left message on patient's identified voicemail to call back.

## 2013-09-08 ENCOUNTER — Encounter: Payer: Self-pay | Admitting: Physical Therapy

## 2013-09-08 DIAGNOSIS — M5030 Other cervical disc degeneration, unspecified cervical region: Secondary | ICD-10-CM | POA: Diagnosis not present

## 2013-09-08 DIAGNOSIS — M5137 Other intervertebral disc degeneration, lumbosacral region: Secondary | ICD-10-CM | POA: Diagnosis not present

## 2013-09-08 DIAGNOSIS — G894 Chronic pain syndrome: Secondary | ICD-10-CM | POA: Diagnosis not present

## 2013-09-08 DIAGNOSIS — Z79899 Other long term (current) drug therapy: Secondary | ICD-10-CM | POA: Diagnosis not present

## 2013-09-09 ENCOUNTER — Encounter: Payer: Self-pay | Admitting: Physical Therapy

## 2013-09-16 ENCOUNTER — Encounter: Payer: Self-pay | Admitting: Physical Therapy

## 2013-09-22 ENCOUNTER — Encounter: Payer: Self-pay | Admitting: Physical Therapy

## 2013-09-24 ENCOUNTER — Ambulatory Visit: Payer: Managed Care, Other (non HMO)

## 2013-09-25 ENCOUNTER — Ambulatory Visit: Payer: Medicare Other | Admitting: Radiation Oncology

## 2013-09-29 ENCOUNTER — Ambulatory Visit: Payer: Managed Care, Other (non HMO) | Attending: Surgery | Admitting: Physical Therapy

## 2013-09-29 DIAGNOSIS — R609 Edema, unspecified: Secondary | ICD-10-CM | POA: Diagnosis not present

## 2013-09-29 DIAGNOSIS — M79609 Pain in unspecified limb: Secondary | ICD-10-CM | POA: Insufficient documentation

## 2013-09-29 DIAGNOSIS — I10 Essential (primary) hypertension: Secondary | ICD-10-CM | POA: Diagnosis not present

## 2013-09-29 DIAGNOSIS — F411 Generalized anxiety disorder: Secondary | ICD-10-CM | POA: Diagnosis not present

## 2013-09-29 DIAGNOSIS — IMO0002 Reserved for concepts with insufficient information to code with codable children: Secondary | ICD-10-CM | POA: Insufficient documentation

## 2013-09-29 DIAGNOSIS — C50919 Malignant neoplasm of unspecified site of unspecified female breast: Secondary | ICD-10-CM | POA: Insufficient documentation

## 2013-09-29 DIAGNOSIS — IMO0001 Reserved for inherently not codable concepts without codable children: Secondary | ICD-10-CM | POA: Insufficient documentation

## 2013-09-29 DIAGNOSIS — M549 Dorsalgia, unspecified: Secondary | ICD-10-CM | POA: Insufficient documentation

## 2013-09-30 ENCOUNTER — Ambulatory Visit: Payer: Self-pay | Admitting: Internal Medicine

## 2013-10-01 ENCOUNTER — Ambulatory Visit: Payer: Managed Care, Other (non HMO) | Admitting: Physical Therapy

## 2013-10-02 ENCOUNTER — Telehealth: Payer: Self-pay | Admitting: Internal Medicine

## 2013-10-02 MED ORDER — GUAIFENESIN-CODEINE 100-10 MG/5ML PO SYRP: 5.0000 mL | ORAL_SOLUTION | Freq: Three times a day (TID) | ORAL | Status: DC | PRN

## 2013-10-02 NOTE — Telephone Encounter (Signed)
Patient Information:  Caller Name: Sianna  Phone: [phone]  Patient: Forestine Na  Gender: Female  DOB: 1951-04-14  Age: 63 Years  PCP: Bluford Kaufmann (Family Practice > 55yrs old)  Office Follow Up:  Does the office need to follow up with this patient?: Yes  Instructions For The Office: Refuses triage; refuses appt; wants Rx called in for cough krs/can  RN Note:  Patient calling back regarding cough medication.  States she called this AM 10/02/13 to request Rx for cough medication with codeine, and has not heard back from office.  Per Epic, there are outstanding telephone requests; last office visit was 08/26/13.  RN attempted to triage for cough, but patient declines; wants Rx called in.  RN advised provider unlikely to call in Rx without examining patient, as it has been over a month since seen in office.  RN offered appt 10/02/13 but patient declines; info to office for provider review/insistence on Rx being called in.  May reach patient at [phone].  krs/can  Symptoms  Reason For Call & Symptoms: medication follow up  Reviewed Health History In EMR: N/A  Reviewed Medications In EMR: N/A  Reviewed Allergies In EMR: N/A  Reviewed Surgeries / Procedures: N/A  Date of Onset of Symptoms: Unknown  Guideline(s) Used:  No Protocol Available - Sick Adult  Disposition Per Guideline:   Discuss with PCP and Callback by Nurse within 1 Hour  Reason For Disposition Reached:   Nursing judgment  Advice Given:  N/A  Patient Will Follow Care Advice:  YES

## 2013-10-02 NOTE — Telephone Encounter (Signed)
Patient Information: ° Caller Name: Lessie ° Phone: [phone] ° Patient: Patino, Sibley ° Gender: Female ° DOB: 08/01/1951 ° Age: 63 Years ° PCP: Kwiatkowski, Peter (Family Practice > 16yrs old) ° °Office Follow Up: ° Does the office need to follow up with this patient?: Yes ° Instructions For The Office: Please f/u with pt concerning cough medication refill, thank you. ° ° °Symptoms ° Reason For Call & Symptoms: Pt states she has a  productive cough and not able to get rid of the cough.  Pt requesting a cough medication refill called into the pharmacy. ° Reviewed Health History In EMR: Yes ° Reviewed Medications In EMR: Yes ° Reviewed Allergies In EMR: Yes ° Reviewed Surgeries / Procedures: Yes ° Date of Onset of Symptoms: 08/25/2013 ° °Guideline(s) Used: ° No Protocol Available - Sick Adult ° °Disposition Per Guideline:  ° Discuss with PCP and Callback by Nurse Today ° °Reason For Disposition Reached:  ° Nursing judgment ° °Advice Given: ° Call Back If: ° New symptoms develop ° You become worse. ° °Patient Refused Recommendation: ° Patient Requests Prescription ° Pt requesting refill on cough medication. ° ° °

## 2013-10-02 NOTE — Telephone Encounter (Signed)
Patient Information:  Caller Name: Amantha  Phone: [phone]  Patient: Alexandria [name]  Gender: Female  DOB: 10-11-1950  Age: 63 Years  PCP: Bluford Kaufmann (Family Practice > 83yrs old)  Office Follow Up:  Does the office need to follow up with this patient?: Yes  Instructions For The Office: Please f/u with pt concerning cough medication refill, thank you.   Symptoms  Reason For Call & Symptoms: Pt states she has a  productive cough and not able to get rid of the cough.  Pt requesting a cough medication refill called into the pharmacy.  Reviewed Health History In EMR: Yes  Reviewed Medications In EMR: Yes  Reviewed Allergies In EMR: Yes  Reviewed Surgeries / Procedures: Yes  Date of Onset of Symptoms: 08/25/2013  Guideline(s) Used:  No Protocol Available - Sick Adult  Disposition Per Guideline:   Discuss with PCP and Callback by Nurse Today  Reason For Disposition Reached:   Nursing judgment  Advice Given:  Call Back If:  New symptoms develop  You become worse.  Patient Refused Recommendation:  Patient Requests Prescription  Pt requesting refill on cough medication.

## 2013-10-02 NOTE — Telephone Encounter (Signed)
Discussed with Dr. Raliegh Ip, okay to refill cough syrup.

## 2013-10-02 NOTE — Telephone Encounter (Signed)
Please advise of okay to refill cough syrup?

## 2013-10-02 NOTE — Telephone Encounter (Signed)
Pt notified Rx for cough syrup will be at the front desk for pick up. Rx printed and signed.

## 2013-10-03 ENCOUNTER — Telehealth: Payer: Self-pay | Admitting: Oncology

## 2013-10-06 ENCOUNTER — Encounter: Payer: Self-pay | Admitting: Physical Therapy

## 2013-10-06 ENCOUNTER — Ambulatory Visit: Payer: Self-pay | Admitting: Oncology

## 2013-10-06 DIAGNOSIS — IMO0001 Reserved for inherently not codable concepts without codable children: Secondary | ICD-10-CM | POA: Diagnosis not present

## 2013-10-06 DIAGNOSIS — Z79899 Other long term (current) drug therapy: Secondary | ICD-10-CM | POA: Diagnosis not present

## 2013-10-06 DIAGNOSIS — G894 Chronic pain syndrome: Secondary | ICD-10-CM | POA: Diagnosis not present

## 2013-10-06 DIAGNOSIS — M5137 Other intervertebral disc degeneration, lumbosacral region: Secondary | ICD-10-CM | POA: Diagnosis not present

## 2013-10-07 ENCOUNTER — Encounter: Payer: Self-pay | Admitting: Radiation Oncology

## 2013-10-07 ENCOUNTER — Ambulatory Visit
Admission: RE | Admit: 2013-10-07 | Discharge: 2013-10-07 | Disposition: A | Discharge status: Home / Self Care | Payer: Medicare Other | Source: Ambulatory Visit | Attending: Radiation Oncology | Admitting: Radiation Oncology

## 2013-10-07 DIAGNOSIS — C50919 Malignant neoplasm of unspecified site of unspecified female breast: Secondary | ICD-10-CM

## 2013-10-07 NOTE — Progress Notes (Signed)
Followup note:  The patient returns today approximately 6 months following completion of radiation therapy following conservative surgery in the management of her T1c N0 invasive ductal carcinoma of the right breast. She is generally doing well. She continues with her physical therapy. She is on Arimidex under the direction of Dr. Humphrey Rolls. She was recently hospitalized in December for "double pneumonia". She has not had a mammogram here in [place] since moving here from [place] last spring.  Physical examination: Alert and oriented.  Wt Readings from Last 3 Encounters:  07/28/13 210 lb (95.255 kg)  05/16/13 204 lb 11.2 oz (92.851 kg)  05/15/13 201 lb (91.173 kg)   Temp Readings from Last 3 Encounters:  08/26/13 97.4 F (36.3 C) Oral  08/22/13 98.2 F (36.8 C) Oral  07/28/13 98 F (36.7 C) Oral   BP Readings from Last 3 Encounters:  08/26/13 140/80  08/22/13 134/74  07/28/13 160/90   Pulse Readings from Last 3 Encounters:  08/26/13 104  08/22/13 82  07/28/13 102   Head and neck examination: Grossly unremarkable. She wears a wig. Nodes: Without palpable cervical, supraclavicular, or axillary lymphadenopathy. Chest: Lungs clear. Breasts: There is mild to moderate fibrosis along the right breast. No masses are appreciated. Left breast without masses or lesions. Abdomen without hepatomegaly. Extremities: Without upper extremity lymphedema.  Impression: Satisfactory progress. I understand that she'll see Dr. Humphrey Rolls on March 2. I think it makes sense for her to have mammography later this spring or early summer, and I defer to Dr. Humphrey Rolls.  Plan: Followup mammography this spring or summer through Dr. Humphrey Rolls, and followup visit with Dr. Isidore Moos in 6 months.

## 2013-10-07 NOTE — Progress Notes (Signed)
Pt states she was hospitalized w/double pneumonia in [place] in Dec, and that is why she missed her FU w/Dr Humphrey Rolls. She states Dr Laurelyn Sickle office cancelled her FU on 10/06/13. She reports occasional fatigue, some tenderness of her right breast. She is taking Arimidex daily.

## 2013-10-08 ENCOUNTER — Encounter: Payer: Self-pay | Admitting: Physical Therapy

## 2013-10-09 ENCOUNTER — Ambulatory Visit: Payer: Managed Care, Other (non HMO) | Admitting: Physical Therapy

## 2013-10-13 ENCOUNTER — Ambulatory Visit: Payer: Managed Care, Other (non HMO) | Admitting: Physical Therapy

## 2013-10-13 DIAGNOSIS — C50919 Malignant neoplasm of unspecified site of unspecified female breast: Secondary | ICD-10-CM | POA: Diagnosis not present

## 2013-10-13 DIAGNOSIS — IMO0001 Reserved for inherently not codable concepts without codable children: Secondary | ICD-10-CM | POA: Diagnosis not present

## 2013-10-13 DIAGNOSIS — IMO0002 Reserved for concepts with insufficient information to code with codable children: Secondary | ICD-10-CM | POA: Diagnosis not present

## 2013-10-13 DIAGNOSIS — I10 Essential (primary) hypertension: Secondary | ICD-10-CM | POA: Diagnosis not present

## 2013-10-13 DIAGNOSIS — F411 Generalized anxiety disorder: Secondary | ICD-10-CM | POA: Diagnosis not present

## 2013-10-13 DIAGNOSIS — R609 Edema, unspecified: Secondary | ICD-10-CM | POA: Diagnosis not present

## 2013-10-15 ENCOUNTER — Ambulatory Visit: Payer: Managed Care, Other (non HMO)

## 2013-10-15 DIAGNOSIS — R609 Edema, unspecified: Secondary | ICD-10-CM | POA: Diagnosis not present

## 2013-10-15 DIAGNOSIS — F411 Generalized anxiety disorder: Secondary | ICD-10-CM | POA: Diagnosis not present

## 2013-10-15 DIAGNOSIS — I10 Essential (primary) hypertension: Secondary | ICD-10-CM | POA: Diagnosis not present

## 2013-10-15 DIAGNOSIS — IMO0002 Reserved for concepts with insufficient information to code with codable children: Secondary | ICD-10-CM | POA: Diagnosis not present

## 2013-10-15 DIAGNOSIS — IMO0001 Reserved for inherently not codable concepts without codable children: Secondary | ICD-10-CM | POA: Diagnosis not present

## 2013-10-15 DIAGNOSIS — C50919 Malignant neoplasm of unspecified site of unspecified female breast: Secondary | ICD-10-CM | POA: Diagnosis not present

## 2013-10-20 ENCOUNTER — Ambulatory Visit: Payer: Managed Care, Other (non HMO) | Admitting: Physical Therapy

## 2013-10-22 ENCOUNTER — Ambulatory Visit: Payer: Managed Care, Other (non HMO) | Attending: Oncology | Admitting: Physical Therapy

## 2013-10-22 ENCOUNTER — Ambulatory Visit: Payer: Managed Care, Other (non HMO)

## 2013-10-22 DIAGNOSIS — I10 Essential (primary) hypertension: Secondary | ICD-10-CM | POA: Insufficient documentation

## 2013-10-22 DIAGNOSIS — M549 Dorsalgia, unspecified: Secondary | ICD-10-CM | POA: Insufficient documentation

## 2013-10-22 DIAGNOSIS — C50919 Malignant neoplasm of unspecified site of unspecified female breast: Secondary | ICD-10-CM | POA: Insufficient documentation

## 2013-10-22 DIAGNOSIS — F411 Generalized anxiety disorder: Secondary | ICD-10-CM | POA: Insufficient documentation

## 2013-10-22 DIAGNOSIS — R609 Edema, unspecified: Secondary | ICD-10-CM | POA: Insufficient documentation

## 2013-10-22 DIAGNOSIS — IMO0002 Reserved for concepts with insufficient information to code with codable children: Secondary | ICD-10-CM | POA: Insufficient documentation

## 2013-10-22 DIAGNOSIS — M79609 Pain in unspecified limb: Secondary | ICD-10-CM | POA: Insufficient documentation

## 2013-10-22 DIAGNOSIS — IMO0001 Reserved for inherently not codable concepts without codable children: Secondary | ICD-10-CM | POA: Insufficient documentation

## 2013-10-23 ENCOUNTER — Ambulatory Visit: Payer: Self-pay | Admitting: Internal Medicine

## 2013-10-27 ENCOUNTER — Ambulatory Visit: Payer: Managed Care, Other (non HMO) | Admitting: Physical Therapy

## 2013-10-28 ENCOUNTER — Ambulatory Visit: Payer: Self-pay | Admitting: Internal Medicine

## 2013-10-29 ENCOUNTER — Ambulatory Visit: Payer: Managed Care, Other (non HMO)

## 2013-10-30 ENCOUNTER — Ambulatory Visit: Payer: Self-pay | Admitting: Internal Medicine

## 2013-11-01 ENCOUNTER — Other Ambulatory Visit: Payer: Self-pay | Admitting: Internal Medicine

## 2013-11-03 ENCOUNTER — Ambulatory Visit: Payer: Managed Care, Other (non HMO) | Admitting: Physical Therapy

## 2013-11-03 DIAGNOSIS — M5137 Other intervertebral disc degeneration, lumbosacral region: Secondary | ICD-10-CM | POA: Diagnosis not present

## 2013-11-03 DIAGNOSIS — Z79899 Other long term (current) drug therapy: Secondary | ICD-10-CM | POA: Diagnosis not present

## 2013-11-03 DIAGNOSIS — G894 Chronic pain syndrome: Secondary | ICD-10-CM | POA: Diagnosis not present

## 2013-11-03 DIAGNOSIS — IMO0001 Reserved for inherently not codable concepts without codable children: Secondary | ICD-10-CM | POA: Diagnosis not present

## 2013-11-04 ENCOUNTER — Encounter: Payer: Managed Care, Other (non HMO) | Admitting: Physical Therapy

## 2013-11-06 ENCOUNTER — Ambulatory Visit: Payer: Managed Care, Other (non HMO)

## 2013-11-07 ENCOUNTER — Telehealth: Payer: Self-pay | Admitting: Internal Medicine

## 2013-11-07 MED ORDER — GUAIFENESIN-CODEINE 100-10 MG/5ML PO SYRP: 5.0000 mL | ORAL_SOLUTION | Freq: Three times a day (TID) | ORAL | Status: DC | PRN

## 2013-11-07 NOTE — Telephone Encounter (Signed)
Ok 6 oz

## 2013-11-07 NOTE — Telephone Encounter (Signed)
Okay to refill cough medication 

## 2013-11-07 NOTE — Telephone Encounter (Signed)
Pt is requesting a new rx of guaiFENesin-codeine (ROBITUSSIN AC) 100-10 MG/5ML syrup. Pt states she has a terrible cough and congestion,  I attempted to make an appointment but pt says she has no ride.  Would like something to "hold her over" for the weekend, until she can come in.  Has an appointment on 3/23 with K. Please advise.

## 2013-11-07 NOTE — Telephone Encounter (Signed)
Spoke to pt told her Rx refill for cough syrup is ready for pickup, will be at front desk we are open till 5:00 pm. Pt verbalized understanding. Rx printed and signed.

## 2013-11-10 ENCOUNTER — Emergency Department (HOSPITAL_COMMUNITY): Payer: Managed Care, Other (non HMO)

## 2013-11-10 ENCOUNTER — Encounter (HOSPITAL_COMMUNITY): Payer: Self-pay | Admitting: Emergency Medicine

## 2013-11-10 ENCOUNTER — Ambulatory Visit: Payer: Managed Care, Other (non HMO) | Admitting: Physical Therapy

## 2013-11-10 ENCOUNTER — Emergency Department (HOSPITAL_COMMUNITY)
Admission: EM | Admit: 2013-11-10 | Discharge: 2013-11-10 | Discharge status: Against Medical Advice | Payer: Managed Care, Other (non HMO) | Attending: Emergency Medicine | Admitting: Emergency Medicine

## 2013-11-10 DIAGNOSIS — D509 Iron deficiency anemia, unspecified: Secondary | ICD-10-CM | POA: Diagnosis not present

## 2013-11-10 DIAGNOSIS — E669 Obesity, unspecified: Secondary | ICD-10-CM | POA: Insufficient documentation

## 2013-11-10 DIAGNOSIS — Z923 Personal history of irradiation: Secondary | ICD-10-CM | POA: Insufficient documentation

## 2013-11-10 DIAGNOSIS — R231 Pallor: Secondary | ICD-10-CM | POA: Diagnosis not present

## 2013-11-10 DIAGNOSIS — G8929 Other chronic pain: Secondary | ICD-10-CM | POA: Diagnosis not present

## 2013-11-10 DIAGNOSIS — Z9221 Personal history of antineoplastic chemotherapy: Secondary | ICD-10-CM | POA: Diagnosis not present

## 2013-11-10 DIAGNOSIS — Z8669 Personal history of other diseases of the nervous system and sense organs: Secondary | ICD-10-CM | POA: Diagnosis not present

## 2013-11-10 DIAGNOSIS — Z86711 Personal history of pulmonary embolism: Secondary | ICD-10-CM | POA: Diagnosis not present

## 2013-11-10 DIAGNOSIS — I5030 Unspecified diastolic (congestive) heart failure: Secondary | ICD-10-CM | POA: Insufficient documentation

## 2013-11-10 DIAGNOSIS — Z853 Personal history of malignant neoplasm of breast: Secondary | ICD-10-CM | POA: Insufficient documentation

## 2013-11-10 DIAGNOSIS — D649 Anemia, unspecified: Secondary | ICD-10-CM | POA: Diagnosis not present

## 2013-11-10 DIAGNOSIS — Z79899 Other long term (current) drug therapy: Secondary | ICD-10-CM | POA: Insufficient documentation

## 2013-11-10 DIAGNOSIS — J441 Chronic obstructive pulmonary disease with (acute) exacerbation: Secondary | ICD-10-CM | POA: Insufficient documentation

## 2013-11-10 DIAGNOSIS — R0600 Dyspnea, unspecified: Secondary | ICD-10-CM

## 2013-11-10 DIAGNOSIS — F411 Generalized anxiety disorder: Secondary | ICD-10-CM | POA: Insufficient documentation

## 2013-11-10 DIAGNOSIS — M549 Dorsalgia, unspecified: Secondary | ICD-10-CM | POA: Diagnosis not present

## 2013-11-10 DIAGNOSIS — R0602 Shortness of breath: Secondary | ICD-10-CM | POA: Diagnosis present

## 2013-11-10 DIAGNOSIS — Z8701 Personal history of pneumonia (recurrent): Secondary | ICD-10-CM | POA: Diagnosis not present

## 2013-11-10 LAB — BASIC METABOLIC PANEL WITH GFR
CO2: 29 meq/L (ref 19–32)
Chloride: 97 meq/L (ref 96–112)
Potassium: 3.4 meq/L — ABNORMAL LOW (ref 3.7–5.3)
Sodium: 140 meq/L (ref 137–147)

## 2013-11-10 LAB — CBC WITH DIFFERENTIAL/PLATELET
Basophils Absolute: 0 10*3/uL (ref 0.0–0.1)
Basophils Relative: 0 % (ref 0–1)
Eosinophils Absolute: 0.2 10*3/uL (ref 0.0–0.7)
Eosinophils Relative: 4 % (ref 0–5)
HCT: 26.1 % — ABNORMAL LOW (ref 36.0–46.0)
Hemoglobin: 7.6 g/dL — ABNORMAL LOW (ref 12.0–15.0)
Lymphocytes Relative: 18 % (ref 12–46)
Lymphs Abs: 0.9 10*3/uL (ref 0.7–4.0)
MCH: 19.1 pg — ABNORMAL LOW (ref 26.0–34.0)
MCHC: 29.1 g/dL — ABNORMAL LOW (ref 30.0–36.0)
MCV: 65.6 fL — ABNORMAL LOW (ref 78.0–100.0)
Monocytes Absolute: 0.5 10*3/uL (ref 0.1–1.0)
Monocytes Relative: 10 % (ref 3–12)
Neutro Abs: 3.4 10*3/uL (ref 1.7–7.7)
Neutrophils Relative %: 68 % (ref 43–77)
Platelets: 300 10*3/uL (ref 150–400)
RBC: 3.98 MIL/uL (ref 3.87–5.11)
RDW: 20.1 % — ABNORMAL HIGH (ref 11.5–15.5)
WBC: 5 K/uL (ref 4.0–10.5)

## 2013-11-10 LAB — BASIC METABOLIC PANEL
BUN: 17 mg/dL (ref 6–23)
Calcium: 9 mg/dL (ref 8.4–10.5)
Creatinine, Ser: 0.95 mg/dL (ref 0.50–1.10)
GFR calc Af Amer: 74 mL/min — ABNORMAL LOW (ref >90)
GFR calc non Af Amer: 64 mL/min — ABNORMAL LOW (ref >90)
Glucose, Bld: 122 mg/dL — ABNORMAL HIGH (ref 70–99)

## 2013-11-10 LAB — I-STAT TROPONIN, ED: Troponin i, poc: 0 ng/mL (ref 0.00–0.08)

## 2013-11-10 MED ORDER — ALBUTEROL SULFATE HFA 108 (90 BASE) MCG/ACT IN AERS
1.0000 | INHALATION_SPRAY | Freq: Four times a day (QID) | RESPIRATORY_TRACT | Status: DC | PRN
  Administered 2013-11-10: 2 via RESPIRATORY_TRACT
  Filled 2013-11-10: qty 6.7

## 2013-11-10 NOTE — ED Provider Notes (Signed)
CSN: [id number]     Arrival date & time 11/10/13  1303 History   First MD Initiated Contact with Patient 11/10/13 1501     Chief Complaint  Patient presents with  . Shortness of Breath     (Consider location/radiation/quality/duration/timing/severity/associated sxs/prior Treatment) HPI Comments: Pt with h/o pneumonia, CHF, breast cancer, no longe receiving treatments, has felt more SOB over the past few days.  Her history goes back several months to lymphedema she attributes to her chemo [place] in a different state starting in August, and also due to recent bouts of pneumonia over the winter, last time in December.  She has inhaler and nebulizer at home.  She has CP and back pain, also a h/o chronic pain and takes dilaudid at home.  She has a h/o anemia and required transfusions in the past.  She denies blood in stool or black, tarry stools.    Patient is a 63 y.o. female presenting with shortness of breath. The history is provided by the patient and the spouse.  Shortness of Breath Severity:  Moderate Onset quality:  Gradual Duration:  3 days Timing:  Constant Progression:  Unchanged Chronicity:  Chronic Relieved by:  Nothing Worsened by:  Activity and coughing Associated symptoms: chest pain, cough, fever and wheezing   Associated symptoms: no diaphoresis and no syncope     Past Medical History  Diagnosis Date  . 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 Iron deficiency anemia   . Status post chemotherapy     4 cycles of Taxotere and cytoxan  . Chronic back pain   . Hx of pulmonary embolus     During c-Section  . Neuropathy   . Obesity     Class 2  . COPD (chronic obstructive pulmonary disease)   . S/P radiation therapy  03/04/2013-04/17/2013    1) Right breast / 50 Gy in 25 fractions/ 2) Right breast boost / 10 Gy in 5 fractions  . Diastolic heart failure   . Hx of  radiation therapy 03/04/13- 04/17/13    right breast 50 Gy 25 fractions, right breast boost 10 Gy 5 fractions   Past Surgical History  Procedure Laterality Date  . Cesarean section    . Right breast lumpectomy     Family History  Problem Relation Age of Onset  . Parkinson's disease Mother    History  Substance Use Topics  . Smoking status: Never Smoker   . Smokeless tobacco: Never Used  . Alcohol Use: No   OB History   Grav Para Term Preterm Abortions TAB SAB Ect Mult Living                 Review of Systems  Constitutional: Positive for fever. Negative for diaphoresis.  Respiratory: Positive for cough, shortness of breath and wheezing.   Cardiovascular: Positive for chest pain. Negative for syncope.  All other systems reviewed and are negative.      Allergies  Contrast media; Albuterol; and Prednisone  Home Medications   Current Outpatient Rx  Name  Route  Sig  Dispense  Refill  . Albuterol Sulfate (PROAIR HFA IN)   Inhalation   Inhale 2 Inhalers into the lungs every 4 (four) hours as needed.         Marland Kitchen anastrozole (ARIMIDEX) 1 MG tablet   Oral   Take 1 mg by mouth daily.         Marland Kitchen  budesonide (PULMICORT) 180 MCG/ACT inhaler   Inhalation   Inhale 2 puffs into the lungs 2 (two) times daily.   1 Inhaler   6   . escitalopram (LEXAPRO) 10 MG tablet   Oral   Take 10 mg by mouth daily.         . furosemide (LASIX) 80 MG tablet   Oral   Take 80 mg by mouth 2 (two) times daily.         Marland Kitchen guaiFENesin-codeine (ROBITUSSIN AC) 100-10 MG/5ML syrup   Oral   Take 5 mLs by mouth 3 (three) times daily as needed for cough.   120 mL   0   . HYDROmorphone (DILAUDID) 4 MG tablet   Oral   Take 4 mg by mouth every 3 (three) hours as needed for pain.         Marland Kitchen HYDROmorphone HCl (EXALGO) 12 MG T24A SR tablet   Oral   Take 12 mg by mouth 2 (two) times daily.         Marland Kitchen levalbuterol (XOPENEX HFA) 45 MCG/ACT inhaler   Inhalation   Inhale 1-2 puffs into the  lungs as needed for wheezing or shortness of breath.          . levalbuterol (XOPENEX) 0.63 MG/3ML nebulizer solution   Nebulization   Take 3 mLs (0.63 mg total) by nebulization every 4 (four) hours as needed for wheezing or shortness of breath.   3 mL   12   . lidocaine (LIDODERM) 5 %   Transdermal   Place 1-3 patches onto the skin daily. Remove & Discard patch within 12 hours or as directed by MD         . lidocaine-prilocaine (EMLA) cream   Topical   Apply 1 application topically as needed (port access).          . lubiprostone (AMITIZA) 24 MCG capsule   Oral   Take 1 capsule (24 mcg total) by mouth 2 (two) times daily with a meal.   60 capsule   2   . Multiple Vitamins-Minerals (HAIR/SKIN/NAILS PO)   Oral   Take 1 tablet by mouth every morning.         . potassium chloride SA (K-DUR,KLOR-CON) 20 MEQ tablet   Oral   Take 20-40 mEq by mouth 3 (three) times daily.          BP 126/72  Pulse 88  Temp(Src) 97.7 F (36.5 C) (Oral)  Resp 24  SpO2 96%  LMP 01/30/2005 Physical Exam  Nursing note and vitals reviewed. Constitutional: She appears well-developed and well-nourished. No distress.  HENT:  Head: Normocephalic and atraumatic.  Eyes: Conjunctivae and EOM are normal.  Neck: Neck supple.  Cardiovascular: Normal rate, regular rhythm and intact distal pulses.   Pulmonary/Chest: Effort normal. No respiratory distress. She has no wheezes. She has no rales.  Abdominal: Soft. There is no tenderness. There is no rebound and no guarding.  Neurological: She is alert.  Skin: Skin is warm. She is not diaphoretic. There is pallor.  Psychiatric: Her speech is normal and behavior is normal. Her mood appears anxious. Cognition and memory are not impaired. She does not express impulsivity or inappropriate judgment.    ED Course  Procedures (including critical care time) Labs Review Labs Reviewed  CBC WITH DIFFERENTIAL - Abnormal; Notable for the following:     Hemoglobin 7.6 (*)    HCT 26.1 (*)    MCV 65.6 (*)    MCH 19.1 (*)  MCHC 29.1 (*)    RDW 20.1 (*)    All other components within normal limits  BASIC METABOLIC PANEL - Abnormal; Notable for the following:    Potassium 3.4 (*)    Glucose, Bld 122 (*)    GFR calc non Af Amer 64 (*)    GFR calc Af Amer 74 (*)    All other components within normal limits  Randolm Idol, ED   Imaging Review Dg Chest 2 View  11/10/2013   CLINICAL DATA:  63 year old female shortness of Breath. Lymphedema. Initial encounter.  EXAM: CHEST  2 VIEW  COMPARISON:  04/13/2013.  FINDINGS: Chronic left paracentral gastric hernia appears stable, is moderate in size. Stable cardiac size and mediastinal contours. Visualized tracheal air column is within normal limits. There is a curvilinear density projecting over the left chest and mediastinum which takes the expected course of a left chest subclavian approach porta cath or tunneled catheter. This is stable since 04/13/2013, new since 10/02/2012. No pneumothorax. No pulmonary edema, pleural effusion or acute pulmonary opacity identified. Bibasilar atelectasis suspected.  IMPRESSION: Stable, no acute cardiopulmonary abnormality.  Chronic gastric hiatal hernia. Suggestion of a chronic left chest radiolucent porta cath or tunneled catheter.   Electronically Signed   By: Lars Pinks M.D.   On: 11/10/2013 14:23    EKG Interpretation    Date/Time:  Monday November 10 2013 13:32:34 EST Ventricular Rate:  105 PR Interval:  176 QRS Duration: 103 QT Interval:  393 QTC Calculation: 519 R Axis:   54 Text Interpretation:  Sinus tachycardia Poor data quality in current ECG precludes serial comparison Confirmed by [facility]  MD, MICHEAL ([id number]) on 11/10/2013 4:17:16 PM           RA sat is 96% and I interpret to be adequate MDM   Final diagnoses:  Anemia  Dyspnea    Lungs clear, no fever . Concerns are her SOB, tachypnea, and her complaints of CP and back pain.  She is found  to be anemic at 7.6, down from 9.4 in August.  I suspect she has symptomatic anmeia and requires transfusion and admission.  She can be further evalauted for other causes as well as GI bleed.  I have recommeneded admission, also spoke to Dr. Chancy Milroy and told pt and family there are no other good outpt resources for transfusion in clinic or in short stay.  Pt doesn't want to stay but would rather come back tomorrow AM and get admitted then.  Pt will be AMA.  Discussed that pt risks worsening condition including death if she is not admitted for her current condition.      Saddie Benders. Dorna Mai, MD 11/10/13 3662

## 2013-11-10 NOTE — ED Notes (Signed)
ekg done at time of arrival; given to Dr Jeneen Rinks

## 2013-11-10 NOTE — Discharge Instructions (Signed)

## 2013-11-10 NOTE — ED Notes (Signed)
Chaplain follow up due to pt's previous admission.  In August 2014, pt had spoken with chaplain about death of brother.  Pt appreciative of chaplain follow up.  Expressed no needs at this time.   [place], [place]

## 2013-11-10 NOTE — ED Notes (Addendum)
Pt reports wanting to leave AMA. EDP aware and provided paperwork. Per patient she was told she needed a blood transfusion, however she did not want to stay overnight and wait for the blood to start. I informed the patient that the blood would be available to be transfused within 1-2 hours. Pt stated that she would return tomorrow morning due to other concerns and appointments. Pt is A/O x4. Pt instructed to return if she should have any worsening shortness of breath or any other concern. Pt left with family member, ambulatory with cane, without any signs of distress.

## 2013-11-10 NOTE — ED Notes (Signed)
Pt previous history of pneumonia in Oct; states feels the same; increased shortness of breath with short ambulation; history of lymphedema--states increased fluid in chest and legs; short of breath all wkend; c/o chest pain all wkend with breathing; fever off and on

## 2013-11-14 ENCOUNTER — Other Ambulatory Visit: Payer: Self-pay | Admitting: *Deleted

## 2013-11-14 ENCOUNTER — Other Ambulatory Visit: Payer: Self-pay

## 2013-11-14 ENCOUNTER — Ambulatory Visit: Payer: Managed Care, Other (non HMO)

## 2013-11-14 ENCOUNTER — Ambulatory Visit (HOSPITAL_BASED_OUTPATIENT_CLINIC_OR_DEPARTMENT_OTHER): Payer: Medicare Other

## 2013-11-14 ENCOUNTER — Ambulatory Visit (HOSPITAL_COMMUNITY)
Admission: RE | Admit: 2013-11-14 | Discharge: 2013-11-14 | Disposition: A | Discharge status: Home / Self Care | Payer: Medicare Other | Source: Ambulatory Visit | Attending: Oncology | Admitting: Oncology

## 2013-11-14 DIAGNOSIS — C50411 Malignant neoplasm of upper-outer quadrant of right female breast: Secondary | ICD-10-CM

## 2013-11-14 DIAGNOSIS — C50919 Malignant neoplasm of unspecified site of unspecified female breast: Secondary | ICD-10-CM

## 2013-11-14 DIAGNOSIS — D649 Anemia, unspecified: Secondary | ICD-10-CM | POA: Insufficient documentation

## 2013-11-14 DIAGNOSIS — C50419 Malignant neoplasm of upper-outer quadrant of unspecified female breast: Secondary | ICD-10-CM

## 2013-11-14 LAB — COMPREHENSIVE METABOLIC PANEL (CC13)
ALT: 14 U/L (ref 0–55)
AST: 18 U/L (ref 5–34)
Albumin: 3.2 g/dL — ABNORMAL LOW (ref 3.5–5.0)
Alkaline Phosphatase: 182 U/L — ABNORMAL HIGH (ref 40–150)
Anion Gap: 9 mEq/L (ref 3–11)
BUN: 9.9 mg/dL (ref 7.0–26.0)
CO2: 31 meq/L — AB (ref 22–29)
CREATININE: 0.9 mg/dL (ref 0.6–1.1)
Calcium: 9.1 mg/dL (ref 8.4–10.4)
Chloride: 102 mEq/L (ref 98–109)
GLUCOSE: 120 mg/dL (ref 70–140)
Potassium: 3.6 mEq/L (ref 3.5–5.1)
Sodium: 142 mEq/L (ref 136–145)
Total Bilirubin: <0.2 mg/dL (ref 0.20–1.20)
Total Protein: 6.9 g/dL (ref 6.4–8.3)

## 2013-11-14 LAB — ABO/RH: ABO/RH(D): O POS

## 2013-11-14 LAB — CBC WITH DIFFERENTIAL/PLATELET
BASO%: 0.3 % (ref 0.0–2.0)
Basophils Absolute: 0 10*3/uL (ref 0.0–0.1)
EOS%: 2.8 % (ref 0.0–7.0)
Eosinophils Absolute: 0.2 10*3/uL (ref 0.0–0.5)
HEMATOCRIT: 25 % — AB (ref 34.8–46.6)
HGB: 7.6 g/dL — ABNORMAL LOW (ref 11.6–15.9)
LYMPH%: 17.2 % (ref 14.0–49.7)
MCH: 19 pg — ABNORMAL LOW (ref 25.1–34.0)
MCHC: 30.2 g/dL — ABNORMAL LOW (ref 31.5–36.0)
MCV: 62.8 fL — ABNORMAL LOW (ref 79.5–101.0)
MONO#: 0.4 10*3/uL (ref 0.1–0.9)
MONO%: 8.1 % (ref 0.0–14.0)
NEUT#: 3.9 10*3/uL (ref 1.5–6.5)
NEUT%: 71.6 % (ref 38.4–76.8)
Platelets: 337 10*3/uL (ref 145–400)
RBC: 3.98 10*6/uL (ref 3.70–5.45)
RDW: 21 % — ABNORMAL HIGH (ref 11.2–14.5)
WBC: 5.4 10*3/uL (ref 3.9–10.3)
lymph#: 0.9 10*3/uL (ref 0.9–3.3)

## 2013-11-14 LAB — PREPARE RBC (CROSSMATCH)

## 2013-11-14 MED ORDER — LORAZEPAM 1 MG PO TABS: 0.5000 mg | ORAL_TABLET | Freq: Once | ORAL | Status: DC

## 2013-11-14 MED ORDER — ACETAMINOPHEN 325 MG PO TABS: 650.0000 mg | ORAL_TABLET | Freq: Once | ORAL | Status: DC

## 2013-11-14 MED ORDER — DIPHENHYDRAMINE HCL 25 MG PO CAPS
ORAL_CAPSULE | ORAL | Status: AC
  Filled 2013-11-14: qty 1

## 2013-11-14 MED ORDER — ACETAMINOPHEN 325 MG PO TABS
ORAL_TABLET | ORAL | Status: AC
  Filled 2013-11-14: qty 2

## 2013-11-14 MED ORDER — DIPHENHYDRAMINE HCL 25 MG PO CAPS: 25.0000 mg | ORAL_CAPSULE | Freq: Once | ORAL | Status: DC

## 2013-11-14 MED ORDER — FUROSEMIDE 10 MG/ML IJ SOLN: 20.0000 mg | Freq: Once | INTRAMUSCULAR | Status: DC

## 2013-11-14 MED ORDER — SODIUM CHLORIDE 0.9 % IV SOLN: 250.0000 mL | Freq: Once | INTRAVENOUS | Status: DC

## 2013-11-14 NOTE — Progress Notes (Signed)
Call report received 11/14/13.  Pt went to ED 2/23 for weakness and SOB - ED labs HGB 7.6.  ED wanted to keep pt overnight for blood transfusion, but pt refused b/c she wanted to rcv blood at [facility] and was discharged.   Pt called on-call nurse at 8:16 am 11/14/13 c/o SOB and weakness, advised to call 911 - pt refused saying she will call [facility] office.    Discussed with Lisabeth Register - transfuse 2 units RBC.  Called pt - she wants to receive blood at Center.  Infusion appt available at 11 am.  Patient stated she is on her way and would be here no later than 11 am.  HAR opened.  Orders entered.  POF sent and scheduling notified.  Infusion notified.   TKF

## 2013-11-14 NOTE — Progress Notes (Signed)
Blood blank having difficulties with cross match.  Pt to come back Monday 3/2 for transfusion per Charlestine Massed, NP.  Pt instructed to leave blood bank ID on for Monday's transfusion.  Pt verbalized understanding.  Will call patient to verify appointment times.

## 2013-11-14 NOTE — Progress Notes (Signed)
Per Sherlynn Stalls at blood bank the only units they can find would be strongly incompatible.  Additional blood for testing needs to be drawn and sent to reference lab.  Lab notified, POF sent and scheduling notified.  Pt aware and will be here Mon am at 830 for lab draw.  Transfusion orders will need to be re-entered when compatible blood is found.  Per Lisabeth Register, ok to add 0.5 PO Ativan.  Pt will also need 20 mg Lasix between units.    Sharlynn Oliphant working with hospital in [place] to obtain tip placement on pt port a cath and she will follow up on Monday.    Monday desk RN updated.

## 2013-11-14 NOTE — Patient Instructions (Signed)
Blood Transfusion Information WHAT IS A BLOOD TRANSFUSION? A transfusion is the replacement of blood or some of its parts. Blood is made up of multiple cells which provide different functions.  Red blood cells carry oxygen and are used for blood loss replacement.  White blood cells fight against infection.  Platelets control bleeding.  Plasma helps clot blood.  Other blood products are available for specialized needs, such as hemophilia or other clotting disorders. BEFORE THE TRANSFUSION  Who gives blood for transfusions?   You may be able to donate blood to be used at a later date on yourself (autologous donation).  Relatives can be asked to donate blood. This is generally not any safer than if you have received blood from a stranger. The same precautions are taken to ensure safety when a relative's blood is donated.  Healthy volunteers who are fully evaluated to make sure their blood is safe. This is blood bank blood. Transfusion therapy is the safest it has ever been in the practice of medicine. Before blood is taken from a donor, a complete history is taken to make sure that person has no history of diseases nor engages in risky social behavior (examples are intravenous drug use or sexual activity with multiple partners). The donor's travel history is screened to minimize risk of transmitting infections, such as malaria. The donated blood is tested for signs of infectious diseases, such as HIV and hepatitis. The blood is then tested to be sure it is compatible with you in order to minimize the chance of a transfusion reaction. If you or a relative donates blood, this is often done in anticipation of surgery and is not appropriate for emergency situations. It takes many days to process the donated blood. RISKS AND COMPLICATIONS Although transfusion therapy is very safe and saves many lives, the main dangers of transfusion include:   Getting an infectious disease.  Developing a  transfusion reaction. This is an allergic reaction to something in the blood you were given. Every precaution is taken to prevent this. The decision to have a blood transfusion has been considered carefully by your caregiver before blood is given. Blood is not given unless the benefits outweigh the risks. AFTER THE TRANSFUSION  Right after receiving a blood transfusion, you will usually feel much better and more energetic. This is especially true if your red blood cells have gotten low (anemic). The transfusion raises the level of the red blood cells which carry oxygen, and this usually causes an energy increase.  The nurse administering the transfusion will monitor you carefully for complications. HOME CARE INSTRUCTIONS  No special instructions are needed after a transfusion. You may find your energy is better. Speak with your caregiver about any limitations on activity for underlying diseases you may have. SEEK MEDICAL CARE IF:   Your condition is not improving after your transfusion.  You develop redness or irritation at the intravenous (IV) site. SEEK IMMEDIATE MEDICAL CARE IF:  Any of the following symptoms occur over the next 12 hours:  Shaking chills.  You have a temperature by mouth above 102 F (38.9 C), not controlled by medicine.  Chest, back, or muscle pain.  People around you feel you are not acting correctly or are confused.  Shortness of breath or difficulty breathing.  Dizziness and fainting.  You get a rash or develop hives.  You have a decrease in urine output.  Your urine turns a dark color or changes to pink, red, or brown. Any of the following   symptoms occur over the next 10 days:  You have a temperature by mouth above 102 F (38.9 C), not controlled by medicine.  Shortness of breath.  Weakness after normal activity.  The white part of the eye turns yellow (jaundice).  You have a decrease in the amount of urine or are urinating less often.  Your  urine turns a dark color or changes to pink, red, or brown. Document Released: 09/01/2000 Document Revised: 11/27/2011 Document Reviewed: 04/20/2008 Exit[employer] Patient Information 2014 [employer].  

## 2013-11-17 ENCOUNTER — Encounter: Payer: Self-pay | Admitting: *Deleted

## 2013-11-17 ENCOUNTER — Ambulatory Visit: Payer: Self-pay | Admitting: Oncology

## 2013-11-17 ENCOUNTER — Encounter: Payer: Self-pay | Admitting: Physical Therapy

## 2013-11-17 ENCOUNTER — Other Ambulatory Visit: Payer: Self-pay

## 2013-11-17 ENCOUNTER — Ambulatory Visit (HOSPITAL_COMMUNITY)
Admission: RE | Admit: 2013-11-17 | Discharge: 2013-11-17 | Disposition: A | Discharge status: Home / Self Care | Payer: Managed Care, Other (non HMO) | Source: Ambulatory Visit | Attending: Oncology | Admitting: Oncology

## 2013-11-17 ENCOUNTER — Other Ambulatory Visit (HOSPITAL_BASED_OUTPATIENT_CLINIC_OR_DEPARTMENT_OTHER): Payer: Managed Care, Other (non HMO)

## 2013-11-17 ENCOUNTER — Other Ambulatory Visit: Payer: Self-pay | Admitting: *Deleted

## 2013-11-17 DIAGNOSIS — C50419 Malignant neoplasm of upper-outer quadrant of unspecified female breast: Secondary | ICD-10-CM

## 2013-11-17 DIAGNOSIS — D509 Iron deficiency anemia, unspecified: Secondary | ICD-10-CM | POA: Insufficient documentation

## 2013-11-17 LAB — CBC WITH DIFFERENTIAL/PLATELET
BASO%: 0.3 % (ref 0.0–2.0)
Basophils Absolute: 0 10*3/uL (ref 0.0–0.1)
EOS ABS: 0.2 10*3/uL (ref 0.0–0.5)
EOS%: 2.6 % (ref 0.0–7.0)
HCT: 25.1 % — ABNORMAL LOW (ref 34.8–46.6)
HEMOGLOBIN: 7.5 g/dL — AB (ref 11.6–15.9)
LYMPH%: 15.7 % (ref 14.0–49.7)
MCH: 18.7 pg — ABNORMAL LOW (ref 25.1–34.0)
MCHC: 30 g/dL — ABNORMAL LOW (ref 31.5–36.0)
MCV: 62.3 fL — ABNORMAL LOW (ref 79.5–101.0)
MONO#: 0.5 10*3/uL (ref 0.1–0.9)
MONO%: 8.4 % (ref 0.0–14.0)
NEUT%: 73 % (ref 38.4–76.8)
NEUTROS ABS: 4.8 10*3/uL (ref 1.5–6.5)
Platelets: 343 10*3/uL (ref 145–400)
RBC: 4.04 10*6/uL (ref 3.70–5.45)
RDW: 21.4 % — AB (ref 11.2–14.5)
WBC: 6.5 10*3/uL (ref 3.9–10.3)
lymph#: 1 10*3/uL (ref 0.9–3.3)

## 2013-11-17 LAB — COMPREHENSIVE METABOLIC PANEL (CC13)
ALBUMIN: 3 g/dL — AB (ref 3.5–5.0)
ALT: 16 U/L (ref 0–55)
AST: 21 U/L (ref 5–34)
Alkaline Phosphatase: 182 U/L — ABNORMAL HIGH (ref 40–150)
Anion Gap: 7 mEq/L (ref 3–11)
BUN: 8.2 mg/dL (ref 7.0–26.0)
CALCIUM: 8.8 mg/dL (ref 8.4–10.4)
CHLORIDE: 107 meq/L (ref 98–109)
CO2: 27 mEq/L (ref 22–29)
Creatinine: 0.9 mg/dL (ref 0.6–1.1)
Glucose: 131 mg/dl (ref 70–140)
POTASSIUM: 3.7 meq/L (ref 3.5–5.1)
Sodium: 140 mEq/L (ref 136–145)
Total Bilirubin: <0.2 mg/dL (ref 0.20–1.20)
Total Protein: 6.7 g/dL (ref 6.4–8.3)

## 2013-11-17 NOTE — Progress Notes (Signed)
Patient came to [facility] today to have labs drawn for a blood transfusion. Patient has antibodies and labs were drawn to send out to a reference lab. Patient is aware of this situation. Patient stated to the lab tech, "I feel real bad today, when do I get my blood?" Lab tech came to the desk nurse to ask. I went and spoke with the patient. I informed the patient that she would not get blood today, possibly tomorrow if there is a Orthoptist. Someone from [facility] will call her when the blood is ready. Patient stated, "I feel bad today, I need blood today." I told patient if she felt that bad, then she needs to go to the ER and be assessed. Patient verbalized understanding. Also, patient cancelled her appointment with Dr. Humphrey Rolls today because she didn't want to wait til 3:30 pm. She also didn't want to come back. POF sent to scheduling to cancel her appointments for today.

## 2013-11-18 ENCOUNTER — Other Ambulatory Visit: Payer: Self-pay | Admitting: *Deleted

## 2013-11-18 ENCOUNTER — Telehealth: Payer: Self-pay | Admitting: Oncology

## 2013-11-18 LAB — TYPE AND SCREEN
ABO/RH(D): O POS
Antibody Screen: POSITIVE
DAT, IgG: NEGATIVE

## 2013-11-18 NOTE — Telephone Encounter (Signed)
Telephone note:  I have reviewed all of the phone calls have my nurses have made to the patient. Patient was seen in the emergency room last week. She was advised to have a blood transfusion as her hemoglobin was in the 7.5 range. She was also advised of her situation by the emergency room physician. However patient left AMA. She was subsequently set up an appointment to see me on Monday, 11/17/2013 at 3:30. Patient showed at the [facility] thinking that she was going to receive a blood transfusion. She was advised that she was not going to receive blood transfusion and infection he had to be typed and crossed. This was done and patient was found to have multiple antibodies. This made cross matching very difficult. Her samples apparently were sent to [facility]. We are still unable to obtain a good match. Patient has been calling my nurse multiple times regarding when she is going to be getting a blood transfusion.  Today my nurse and I had conversation discussing the need for transfusion in this patient. However I feel that she needs to be seen and evaluated by me first. Because of this I spoke to the patient directly over the phone. I have advised her to come and see me on Thursday, 11/20/2013. She understands that I will add her in to my schedule. She may have a long wait. I also advised her that she may or may not be able to get a blood transfusion in the outpatient setting. Ultimately we may have to admit her to the hospital for a transfusion.  I have advised the patient very clearly that she should seek help if she develops any shortness of breath chest pains palpitations weakness fatigue nausea vomiting or any symptoms related to anemia. I advised her to go to the nearest emergency room immediately with any symptomatology of cardiac disease or any bleeding.  Patient demonstrated understanding of my advice to her. She was also advised to see her primary care physician for her other ongoing medical  issues.  Marcy Panning, MD Medical/Oncology [facility] [phone] (beeper) [phone] (Office)  11/18/2013, 2:58 PM

## 2013-11-19 ENCOUNTER — Telehealth: Payer: Self-pay | Admitting: Oncology

## 2013-11-19 ENCOUNTER — Encounter (HOSPITAL_COMMUNITY): Payer: Self-pay | Admitting: Emergency Medicine

## 2013-11-19 ENCOUNTER — Encounter: Payer: Self-pay | Admitting: Physical Therapy

## 2013-11-19 ENCOUNTER — Emergency Department (HOSPITAL_COMMUNITY): Payer: Managed Care, Other (non HMO)

## 2013-11-19 ENCOUNTER — Observation Stay (HOSPITAL_COMMUNITY)
Admission: EM | Admit: 2013-11-19 | Discharge: 2013-11-20 | Disposition: A | Discharge status: Home / Self Care | Payer: Managed Care, Other (non HMO) | Attending: Internal Medicine | Admitting: Internal Medicine

## 2013-11-19 ENCOUNTER — Other Ambulatory Visit: Payer: Self-pay | Admitting: *Deleted

## 2013-11-19 DIAGNOSIS — M549 Dorsalgia, unspecified: Secondary | ICD-10-CM | POA: Diagnosis not present

## 2013-11-19 DIAGNOSIS — E876 Hypokalemia: Secondary | ICD-10-CM

## 2013-11-19 DIAGNOSIS — G8929 Other chronic pain: Secondary | ICD-10-CM | POA: Diagnosis present

## 2013-11-19 DIAGNOSIS — K5909 Other constipation: Secondary | ICD-10-CM

## 2013-11-19 DIAGNOSIS — R42 Dizziness and giddiness: Secondary | ICD-10-CM | POA: Insufficient documentation

## 2013-11-19 DIAGNOSIS — I10 Essential (primary) hypertension: Secondary | ICD-10-CM | POA: Diagnosis present

## 2013-11-19 DIAGNOSIS — J4489 Other specified chronic obstructive pulmonary disease: Secondary | ICD-10-CM | POA: Insufficient documentation

## 2013-11-19 DIAGNOSIS — J9620 Acute and chronic respiratory failure, unspecified whether with hypoxia or hypercapnia: Secondary | ICD-10-CM

## 2013-11-19 DIAGNOSIS — D649 Anemia, unspecified: Secondary | ICD-10-CM | POA: Diagnosis not present

## 2013-11-19 DIAGNOSIS — N179 Acute kidney failure, unspecified: Secondary | ICD-10-CM

## 2013-11-19 DIAGNOSIS — K219 Gastro-esophageal reflux disease without esophagitis: Secondary | ICD-10-CM | POA: Insufficient documentation

## 2013-11-19 DIAGNOSIS — Z91041 Radiographic dye allergy status: Secondary | ICD-10-CM | POA: Insufficient documentation

## 2013-11-19 DIAGNOSIS — R011 Cardiac murmur, unspecified: Secondary | ICD-10-CM

## 2013-11-19 DIAGNOSIS — J449 Chronic obstructive pulmonary disease, unspecified: Secondary | ICD-10-CM | POA: Insufficient documentation

## 2013-11-19 DIAGNOSIS — E669 Obesity, unspecified: Secondary | ICD-10-CM | POA: Insufficient documentation

## 2013-11-19 DIAGNOSIS — I471 Supraventricular tachycardia: Secondary | ICD-10-CM

## 2013-11-19 DIAGNOSIS — K5900 Constipation, unspecified: Secondary | ICD-10-CM | POA: Insufficient documentation

## 2013-11-19 DIAGNOSIS — Z86711 Personal history of pulmonary embolism: Secondary | ICD-10-CM | POA: Insufficient documentation

## 2013-11-19 DIAGNOSIS — E43 Unspecified severe protein-calorie malnutrition: Secondary | ICD-10-CM

## 2013-11-19 DIAGNOSIS — Z923 Personal history of irradiation: Secondary | ICD-10-CM | POA: Insufficient documentation

## 2013-11-19 DIAGNOSIS — F4323 Adjustment disorder with mixed anxiety and depressed mood: Secondary | ICD-10-CM

## 2013-11-19 DIAGNOSIS — R0989 Other specified symptoms and signs involving the circulatory and respiratory systems: Secondary | ICD-10-CM | POA: Insufficient documentation

## 2013-11-19 DIAGNOSIS — R601 Generalized edema: Secondary | ICD-10-CM

## 2013-11-19 DIAGNOSIS — C50919 Malignant neoplasm of unspecified site of unspecified female breast: Secondary | ICD-10-CM

## 2013-11-19 DIAGNOSIS — R600 Localized edema: Secondary | ICD-10-CM

## 2013-11-19 DIAGNOSIS — J383 Other diseases of vocal cords: Secondary | ICD-10-CM

## 2013-11-19 DIAGNOSIS — C50419 Malignant neoplasm of upper-outer quadrant of unspecified female breast: Secondary | ICD-10-CM | POA: Insufficient documentation

## 2013-11-19 DIAGNOSIS — I5033 Acute on chronic diastolic (congestive) heart failure: Secondary | ICD-10-CM

## 2013-11-19 DIAGNOSIS — Z9221 Personal history of antineoplastic chemotherapy: Secondary | ICD-10-CM | POA: Insufficient documentation

## 2013-11-19 DIAGNOSIS — D509 Iron deficiency anemia, unspecified: Principal | ICD-10-CM | POA: Diagnosis present

## 2013-11-19 DIAGNOSIS — R0609 Other forms of dyspnea: Secondary | ICD-10-CM | POA: Insufficient documentation

## 2013-11-19 DIAGNOSIS — Z79899 Other long term (current) drug therapy: Secondary | ICD-10-CM | POA: Insufficient documentation

## 2013-11-19 DIAGNOSIS — F411 Generalized anxiety disorder: Secondary | ICD-10-CM

## 2013-11-19 LAB — IRON AND TIBC
Iron: 18 ug/dL — ABNORMAL LOW (ref 42–135)
SATURATION RATIOS: 4 % — AB (ref 20–55)
TIBC: 430 ug/dL (ref 250–470)
UIBC: 412 ug/dL — AB (ref 125–400)

## 2013-11-19 LAB — URINE MICROSCOPIC-ADD ON

## 2013-11-19 LAB — URINALYSIS, ROUTINE W REFLEX MICROSCOPIC
Glucose, UA: NEGATIVE mg/dL
Ketones, ur: NEGATIVE mg/dL
NITRITE: NEGATIVE
PH: 5.5 (ref 5.0–8.0)
Protein, ur: NEGATIVE mg/dL
SPECIFIC GRAVITY, URINE: 1.027 (ref 1.005–1.030)
Urobilinogen, UA: 1 mg/dL (ref 0.0–1.0)

## 2013-11-19 LAB — CBC WITH DIFFERENTIAL/PLATELET
Basophils Absolute: 0 10*3/uL (ref 0.0–0.1)
Basophils Relative: 0 % (ref 0–1)
Eosinophils Absolute: 0.1 10*3/uL (ref 0.0–0.7)
Eosinophils Relative: 2 % (ref 0–5)
HEMATOCRIT: 25.7 % — AB (ref 36.0–46.0)
HEMOGLOBIN: 7.7 g/dL — AB (ref 12.0–15.0)
Lymphocytes Relative: 17 % (ref 12–46)
Lymphs Abs: 1.1 10*3/uL (ref 0.7–4.0)
MCH: 19.1 pg — ABNORMAL LOW (ref 26.0–34.0)
MCHC: 30 g/dL (ref 30.0–36.0)
MCV: 63.8 fL — AB (ref 78.0–100.0)
MONOS PCT: 8 % (ref 3–12)
Monocytes Absolute: 0.5 10*3/uL (ref 0.1–1.0)
NEUTROS ABS: 4.7 10*3/uL (ref 1.7–7.7)
Neutrophils Relative %: 73 % (ref 43–77)
PLATELETS: 318 10*3/uL (ref 150–400)
RBC: 4.03 MIL/uL (ref 3.87–5.11)
RDW: 20.1 % — ABNORMAL HIGH (ref 11.5–15.5)
WBC: 6.4 10*3/uL (ref 4.0–10.5)

## 2013-11-19 LAB — BASIC METABOLIC PANEL
BUN: 14 mg/dL (ref 6–23)
CO2: 31 meq/L (ref 19–32)
Calcium: 8.8 mg/dL (ref 8.4–10.5)
Chloride: 100 mEq/L (ref 96–112)
Creatinine, Ser: 0.93 mg/dL (ref 0.50–1.10)
GFR calc non Af Amer: 65 mL/min — ABNORMAL LOW (ref >90)
GFR, EST AFRICAN AMERICAN: 76 mL/min — AB (ref >90)
Glucose, Bld: 104 mg/dL — ABNORMAL HIGH (ref 70–99)
POTASSIUM: 3.8 meq/L (ref 3.7–5.3)
Sodium: 144 mEq/L (ref 137–147)

## 2013-11-19 LAB — PREPARE RBC (CROSSMATCH)

## 2013-11-19 LAB — FERRITIN: Ferritin: 3 ng/mL — ABNORMAL LOW (ref 10–291)

## 2013-11-19 MED ORDER — FLUTICASONE PROPIONATE HFA 44 MCG/ACT IN AERO
2.0000 | INHALATION_SPRAY | Freq: Two times a day (BID) | RESPIRATORY_TRACT | Status: DC
  Administered 2013-11-20: 2 via RESPIRATORY_TRACT
  Filled 2013-11-19: qty 10.6

## 2013-11-19 MED ORDER — LIDOCAINE 5 % EX PTCH
1.0000 | MEDICATED_PATCH | CUTANEOUS | Status: DC
  Administered 2013-11-20: 2 via TRANSDERMAL
  Filled 2013-11-19: qty 3

## 2013-11-19 MED ORDER — LUBIPROSTONE 24 MCG PO CAPS
24.0000 ug | ORAL_CAPSULE | Freq: Two times a day (BID) | ORAL | Status: DC
  Administered 2013-11-20: 24 ug via ORAL
  Filled 2013-11-19 (×3): qty 1

## 2013-11-19 MED ORDER — HYDROMORPHONE HCL 4 MG PO TABS
4.0000 mg | ORAL_TABLET | ORAL | Status: DC | PRN
  Administered 2013-11-20: 4 mg via ORAL
  Filled 2013-11-19: qty 2

## 2013-11-19 MED ORDER — POTASSIUM CHLORIDE CRYS ER 20 MEQ PO TBCR
20.0000 meq | EXTENDED_RELEASE_TABLET | Freq: Three times a day (TID) | ORAL | Status: DC
  Filled 2013-11-19: qty 2

## 2013-11-19 MED ORDER — DOCUSATE SODIUM 100 MG PO CAPS
100.0000 mg | ORAL_CAPSULE | Freq: Two times a day (BID) | ORAL | Status: DC
  Administered 2013-11-19: 100 mg via ORAL
  Filled 2013-11-19 (×3): qty 1

## 2013-11-19 MED ORDER — LORAZEPAM 1 MG PO TABS
1.0000 mg | ORAL_TABLET | Freq: Once | ORAL | Status: AC
  Administered 2013-11-19: 1 mg via ORAL
  Filled 2013-11-19: qty 1

## 2013-11-19 MED ORDER — DIPHENHYDRAMINE HCL 25 MG PO CAPS
25.0000 mg | ORAL_CAPSULE | Freq: Three times a day (TID) | ORAL | Status: DC | PRN
  Administered 2013-11-19: 25 mg via ORAL
  Filled 2013-11-19: qty 1

## 2013-11-19 MED ORDER — SODIUM CHLORIDE 0.9 % IV BOLUS (SEPSIS): 500.0000 mL | Freq: Once | INTRAVENOUS | Status: DC

## 2013-11-19 MED ORDER — POTASSIUM CHLORIDE CRYS ER 20 MEQ PO TBCR
20.0000 meq | EXTENDED_RELEASE_TABLET | Freq: Three times a day (TID) | ORAL | Status: DC
  Administered 2013-11-19 – 2013-11-20 (×2): 20 meq via ORAL
  Filled 2013-11-19 (×4): qty 1

## 2013-11-19 MED ORDER — ACETAMINOPHEN 650 MG RE SUPP: 650.0000 mg | Freq: Four times a day (QID) | RECTAL | Status: DC | PRN

## 2013-11-19 MED ORDER — HYDROMORPHONE HCL 2 MG PO TABS
4.0000 mg | ORAL_TABLET | Freq: Once | ORAL | Status: AC
  Administered 2013-11-19: 4 mg via ORAL
  Filled 2013-11-19: qty 2

## 2013-11-19 MED ORDER — HYDROMORPHONE HCL ER 12 MG PO T24A
12.0000 mg | EXTENDED_RELEASE_TABLET | Freq: Two times a day (BID) | ORAL | Status: DC
  Administered 2013-11-19: 12 mg via ORAL
  Filled 2013-11-19 (×2): qty 1

## 2013-11-19 MED ORDER — ONDANSETRON HCL 4 MG PO TABS: 4.0000 mg | ORAL_TABLET | Freq: Four times a day (QID) | ORAL | Status: DC | PRN

## 2013-11-19 MED ORDER — PANTOPRAZOLE SODIUM 40 MG PO TBEC
40.0000 mg | DELAYED_RELEASE_TABLET | Freq: Every day | ORAL | Status: DC
  Administered 2013-11-19 – 2013-11-20 (×2): 40 mg via ORAL
  Filled 2013-11-19 (×2): qty 1

## 2013-11-19 MED ORDER — LORAZEPAM 1 MG PO TABS
1.0000 mg | ORAL_TABLET | Freq: Three times a day (TID) | ORAL | Status: DC
  Administered 2013-11-19 – 2013-11-20 (×2): 1 mg via ORAL
  Filled 2013-11-19 (×2): qty 1

## 2013-11-19 MED ORDER — ESCITALOPRAM OXALATE 10 MG PO TABS
10.0000 mg | ORAL_TABLET | Freq: Every day | ORAL | Status: DC
  Administered 2013-11-19 – 2013-11-20 (×2): 10 mg via ORAL
  Filled 2013-11-19 (×2): qty 1

## 2013-11-19 MED ORDER — FUROSEMIDE 80 MG PO TABS
80.0000 mg | ORAL_TABLET | Freq: Once | ORAL | Status: AC
  Administered 2013-11-19: 80 mg via ORAL
  Filled 2013-11-19: qty 1

## 2013-11-19 MED ORDER — ANASTROZOLE 1 MG PO TABS
1.0000 mg | ORAL_TABLET | Freq: Every day | ORAL | Status: DC
  Administered 2013-11-19 – 2013-11-20 (×2): 1 mg via ORAL
  Filled 2013-11-19 (×2): qty 1

## 2013-11-19 MED ORDER — SENNA 8.6 MG PO TABS
1.0000 | ORAL_TABLET | Freq: Two times a day (BID) | ORAL | Status: DC
  Filled 2013-11-19: qty 1

## 2013-11-19 MED ORDER — FUROSEMIDE 80 MG PO TABS
80.0000 mg | ORAL_TABLET | Freq: Three times a day (TID) | ORAL | Status: DC
  Administered 2013-11-19: 80 mg via ORAL
  Filled 2013-11-19 (×4): qty 1

## 2013-11-19 MED ORDER — ONDANSETRON HCL 4 MG/2ML IJ SOLN: 4.0000 mg | Freq: Four times a day (QID) | INTRAMUSCULAR | Status: DC | PRN

## 2013-11-19 MED ORDER — ACETAMINOPHEN 325 MG PO TABS: 650.0000 mg | ORAL_TABLET | Freq: Four times a day (QID) | ORAL | Status: DC | PRN

## 2013-11-19 MED ORDER — LEVALBUTEROL HCL 0.63 MG/3ML IN NEBU: 0.6300 mg | INHALATION_SOLUTION | RESPIRATORY_TRACT | Status: DC | PRN

## 2013-11-19 NOTE — Telephone Encounter (Signed)
, °

## 2013-11-19 NOTE — ED Provider Notes (Signed)
CSN: [id number]     Arrival date & time 11/19/13  1323 History   First MD Initiated Contact with Patient 11/19/13 1500     Chief Complaint  Patient presents with  . Anemia  . Dizziness     (Consider location/radiation/quality/duration/timing/severity/associated sxs/prior Treatment) HPI Comments: 63 year old female presents with symptomatic anemia. She's been feeling dizzy, fatigue, shortness of breath on exertion for several weeks. His been worse over the past week. She was seen here 10 days ago and was diagnosed with anemia area and she refused admission and blood transfusion. She's been following up with her oncologist she has difficult blood crossmatch and thus has not received a transfusion. She's been getting progressively worse they told her to come to the ER. She states she's been having more trouble walking due to her fatigue. She's not fallen. Denies any chest pain. Last night she started having dysuria. She tried douching with water and then noticed some vaginal bleeding. This is the first time she has had vaginal bleeding in several years.   Past Medical History  Diagnosis Date  . 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 Iron deficiency anemia   . Status post chemotherapy     4 cycles of Taxotere and cytoxan  . Chronic back pain   . Hx of pulmonary embolus     During c-Section  . Neuropathy   . Obesity     Class 2  . COPD (chronic obstructive pulmonary disease)   . S/P radiation therapy  03/04/2013-04/17/2013    1) Right breast / 50 Gy in 25 fractions/ 2) Right breast boost / 10 Gy in 5 fractions  . Diastolic heart failure   . Hx of radiation therapy 03/04/13- 04/17/13    right breast 50 Gy 25 fractions, right breast boost 10 Gy 5 fractions   Past Surgical History  Procedure Laterality Date  . Cesarean section    . Right breast lumpectomy     Family History  Problem  Relation Age of Onset  . Parkinson's disease Mother    History  Substance Use Topics  . Smoking status: Never Smoker   . Smokeless tobacco: Never Used  . Alcohol Use: No   OB History   Grav Para Term Preterm Abortions TAB SAB Ect Mult Living                 Review of Systems  Constitutional: Positive for fatigue. Negative for fever.  Respiratory: Positive for shortness of breath.   Cardiovascular: Positive for leg swelling (chronic). Negative for chest pain.  Gastrointestinal: Negative for nausea, vomiting and abdominal pain.  Genitourinary: Positive for dysuria.  Neurological: Positive for dizziness and weakness.  All other systems reviewed and are negative.      Allergies  Contrast media; Albuterol; and Prednisone  Home Medications   Current Outpatient Rx  Name  Route  Sig  Dispense  Refill  . Albuterol Sulfate (PROAIR HFA IN)   Inhalation   Inhale 2 Inhalers into the lungs every 4 (four) hours as needed.         Marland Kitchen anastrozole (ARIMIDEX) 1 MG tablet   Oral   Take 1 mg by mouth daily.         . budesonide (PULMICORT) 180 MCG/ACT inhaler   Inhalation   Inhale 2 puffs into the lungs 2 (two) times daily.   1 Inhaler  6   . escitalopram (LEXAPRO) 10 MG tablet   Oral   Take 10 mg by mouth daily.         . furosemide (LASIX) 80 MG tablet   Oral   Take 80 mg by mouth 3 (three) times daily.          Marland Kitchen guaiFENesin-codeine (ROBITUSSIN AC) 100-10 MG/5ML syrup   Oral   Take 5 mLs by mouth 3 (three) times daily as needed for cough.   120 mL   0   . HYDROmorphone (DILAUDID) 4 MG tablet   Oral   Take 4 mg by mouth every 4 (four) hours as needed.          Marland Kitchen HYDROmorphone HCl (EXALGO) 12 MG T24A SR tablet   Oral   Take 12 mg by mouth 2 (two) times daily.         Marland Kitchen levalbuterol (XOPENEX) 0.63 MG/3ML nebulizer solution   Nebulization   Take 3 mLs (0.63 mg total) by nebulization every 4 (four) hours as needed for wheezing or shortness of breath.   3  mL   12   . lidocaine (LIDODERM) 5 %   Transdermal   Place 1-3 patches onto the skin daily. Remove & Discard patch within 12 hours or as directed by MD         . LORazepam (ATIVAN) 1 MG tablet   Oral   Take 1 mg by mouth every 8 (eight) hours.         Marland Kitchen lubiprostone (AMITIZA) 24 MCG capsule   Oral   Take 1 capsule (24 mcg total) by mouth 2 (two) times daily with a meal.   60 capsule   2   . Multiple Vitamins-Minerals (HAIR/SKIN/NAILS PO)   Oral   Take 1 tablet by mouth every morning.         . potassium chloride SA (K-DUR,KLOR-CON) 20 MEQ tablet   Oral   Take 20-40 mEq by mouth 3 (three) times daily.         Marland Kitchen lidocaine-prilocaine (EMLA) cream   Topical   Apply 1 application topically as needed (port access).           BP 142/80  Pulse 96  Temp(Src) 97.9 F (36.6 C)  Resp 24  SpO2 97%  LMP 01/30/2005 Physical Exam  Nursing note and vitals reviewed. Constitutional: She is oriented to person, place, and time. She appears well-developed and well-nourished. No distress.  HENT:  Head: Normocephalic and atraumatic.  Right Ear: External ear normal.  Left Ear: External ear normal.  Nose: Nose normal.  Eyes: Right eye exhibits no discharge. Left eye exhibits no discharge.  Cardiovascular: Normal rate, regular rhythm and normal heart sounds.   Pulmonary/Chest: Effort normal and breath sounds normal.  Abdominal: Soft. She exhibits no distension. There is no tenderness.  Neurological: She is alert and oriented to person, place, and time.  Skin: Skin is warm and dry.    ED Course  Procedures (including critical care time) Labs Review Labs Reviewed  CBC WITH DIFFERENTIAL - Abnormal; Notable for the following:    Hemoglobin 7.7 (*)    HCT 25.7 (*)    MCV 63.8 (*)    MCH 19.1 (*)    RDW 20.1 (*)    All other components within normal limits  URINALYSIS, ROUTINE W REFLEX MICROSCOPIC  BASIC METABOLIC PANEL   Imaging Review No results found.   EKG  Interpretation None      MDM  Final diagnoses:  Symptomatic anemia    Patient with symptomatic anemia. No obvious UTI given her dysuria symptoms. Will admit to hospitalist for transfusion.    Ephraim Hamburger, MD 11/20/13 [phone]

## 2013-11-19 NOTE — ED Notes (Signed)
Pt seen last wk for hgb of 7 and shortness of breath; dizziness; was to be admitted and refused; back today; pale; increased dizziness; increased shortness of breath; vaginal bleeding started yesterday

## 2013-11-19 NOTE — ED Notes (Signed)
Called report to  Floor [phone]

## 2013-11-19 NOTE — H&P (Signed)
[facility] History and Physical  Alexandria [name] DJT:[id number] DOB: [birth year] DOA: 11/19/2013  Referring physician:  PCP: Nyoka Cowden, MD  Specialists:   Chief Complaint: DOE  HPI: Alexandria [name] is a 63 y.o. female with PMH of COPD, GERD, h/o PUD, breast CA s/p surgery, chemo/rad (2014), anemia presented with DOE, fatigue, tiredness and found to have symptomatic anemia and being admitted with blood TF; she was initially seen at hematology office recommended TF but she left AMA on 11/17/13;  -denies chest pain, no hematemesis, or hematochezia; no dizziness; no focal weakness;   Review of Systems: The patient denies anorexia, fever, weight loss,, vision loss, decreased hearing, hoarseness, chest pain, syncope, dyspnea on exertion, peripheral edema, balance deficits, hemoptysis, abdominal pain, melena, hematochezia, severe indigestion/heartburn, hematuria, incontinence, genital sores, muscle weakness, suspicious skin lesions, transient blindness, difficulty walking, depression, unusual weight change, abnormal bleeding, enlarged lymph nodes, angioedema, and breast masses.    Past Medical History  Diagnosis Date  . Iron deficiency anemia   . Status post chemotherapy     4 cycles of Taxotere and cytoxan  . Chronic back pain   . Hx of pulmonary embolus     During c-Section  . Neuropathy   . Obesity     Class 2  . COPD (chronic obstructive pulmonary disease)   . S/P radiation therapy  03/04/2013-04/17/2013    1) Right breast / 50 Gy in 25 fractions/ 2) Right breast boost / 10 Gy in 5 fractions  . Diastolic heart failure   . Hx of radiation therapy 03/04/13- 04/17/13    right breast 50 Gy 25 fractions, right breast boost 10 Gy 5 fractions  . 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 Cesarean section    . Right breast lumpectomy      Social History:  reports that she has never smoked. She has never used smokeless tobacco. She reports that she does not drink alcohol or use illicit drugs. Home;  where does patient live--home, ALF, SNF? and with whom if at home? Yes;  Can patient participate in ADLs?  Allergies  Allergen Reactions  . Contrast Media [Iodinated Diagnostic Agents] Anaphylaxis  . Albuterol Other (See Comments)    Agitation   . Prednisone Other (See Comments)    Pt gets very agitated when she takes high doses of steroids    Family History  Problem Relation Age of Onset  . Parkinson's disease Mother     (be sure to complete)  Prior to Admission medications   Medication Sig Start Date End Date Taking? Authorizing Provider  Albuterol Sulfate (PROAIR HFA IN) Inhale 2 Inhalers into the lungs every 4 (four) hours as needed.   Yes Historical Provider, MD  anastrozole (ARIMIDEX) 1 MG tablet Take 1 mg by mouth daily.   Yes Historical Provider, MD  budesonide (PULMICORT) 180 MCG/ACT inhaler Inhale 2 puffs into the lungs 2 (two) times daily. 08/26/13  Yes Marletta Lor, MD  escitalopram (LEXAPRO) 10 MG tablet Take 10 mg by mouth daily.   Yes Historical Provider, MD  furosemide (LASIX) 80 MG tablet Take 80 mg by mouth 3 (three) times daily.    Yes Historical Provider, MD  guaiFENesin-codeine (ROBITUSSIN AC) 100-10 MG/5ML syrup Take 5 mLs by mouth 3 (three) times daily as needed for cough. 11/07/13  Yes Marletta Lor,  MD  HYDROmorphone (DILAUDID) 4 MG tablet Take 4 mg by mouth every 4 (four) hours as needed.    Yes Historical Provider, MD  HYDROmorphone HCl (EXALGO) 12 MG T24A SR tablet Take 12 mg by mouth 2 (two) times daily.   Yes Historical Provider, MD  levalbuterol (XOPENEX) 0.63 MG/3ML nebulizer solution Take 3 mLs (0.63 mg total) by nebulization every 4 (four) hours as needed for wheezing or shortness of breath. 08/26/13  Yes Marletta Lor, MD  lidocaine (LIDODERM) 5 % Place 1-3 patches onto the  skin daily. Remove & Discard patch within 12 hours or as directed by MD   Yes Historical Provider, MD  LORazepam (ATIVAN) 1 MG tablet Take 1 mg by mouth every 8 (eight) hours.   Yes Historical Provider, MD  lubiprostone (AMITIZA) 24 MCG capsule Take 1 capsule (24 mcg total) by mouth 2 (two) times daily with a meal. 05/16/13  Yes Deatra Robinson, MD  Multiple Vitamins-Minerals (HAIR/SKIN/NAILS PO) Take 1 tablet by mouth every morning.   Yes Historical Provider, MD  potassium chloride SA (K-DUR,KLOR-CON) 20 MEQ tablet Take 20-40 mEq by mouth 3 (three) times daily.   Yes Historical Provider, MD  lidocaine-prilocaine (EMLA) cream Apply 1 application topically as needed (port access).     Historical Provider, MD   Physical Exam: Filed Vitals:   11/19/13 1650  BP: 143/81  Pulse: 83  Temp:   Resp: 16     General:  alert  Eyes: eom-i  ENT: no oral ulcers   Neck: supple   Cardiovascular: s1,s2 rrr  Respiratory: CTA BL  Abdomen: soft, nt, nd   Skin: no rash   Musculoskeletal: chronic LE lymphedema   Psychiatric: no hallucinations   Neurologic: CN 2-12 intact, motor 5/5 BL  Labs on Admission:  Basic Metabolic Panel:  Recent Labs Lab 11/14/13 1113 11/17/13 1059 11/19/13 1610  NA 142 140 144  K 3.6 3.7 3.8  CL  --   --  100  CO2 31* 27 31  GLUCOSE 120 131 104*  BUN 9.9 8.2 14  CREATININE 0.9 0.9 0.93  CALCIUM 9.1 8.8 8.8   Liver Function Tests:  Recent Labs Lab 11/14/13 1113 11/17/13 1059  AST 18 21  ALT 14 16  ALKPHOS 182* 182*  BILITOT <0.20 <0.20  PROT 6.9 6.7  ALBUMIN 3.2* 3.0*   No results found for this basename: LIPASE, AMYLASE,  in the last 168 hours No results found for this basename: AMMONIA,  in the last 168 hours CBC:  Recent Labs Lab 11/14/13 1113 11/17/13 1059 11/19/13 1430  WBC 5.4 6.5 6.4  NEUTROABS 3.9 4.8 4.7  HGB 7.6* 7.5* 7.7*  HCT 25.0* 25.1* 25.7*  MCV 62.8* 62.3* 63.8*  PLT 337 343 318   Cardiac Enzymes: No results found  for this basename: CKTOTAL, CKMB, CKMBINDEX, TROPONINI,  in the last 168 hours  BNP (last 3 results)  Recent Labs  04/13/13 1925 04/15/13 0517  PROBNP 1218.0* 825.2*   CBG: No results found for this basename: GLUCAP,  in the last 168 hours  Radiological Exams on Admission: Dg Chest Portable 1 View  11/19/2013   CLINICAL DATA:  Dyspnea on exertion, history of pulmonary embolus  EXAM: PORTABLE CHEST - 1 VIEW  COMPARISON:  11/10/2013  FINDINGS: Cardiomediastinal shift is stable. A left chest chronic PICC line or tunneled catheter fragment again noted. Stable gastric herniation left base. No acute infiltrate or pulmonary edema.  IMPRESSION: A left chest chronic PICC line or tunneled  catheter fragment again noted. Stable gastric herniation left base. No acute infiltrate or pulmonary edema.   Electronically Signed   By: Lahoma Crocker M.D.   On: 11/19/2013 16:13    EKG: Independently reviewed. Not done   Assessment/Plan Principal Problem:   Symptomatic anemia Active Problems:   Breast cancer   Hypertension   Chronic back pain   Morbid obesity   Chronic constipation   Anemia  63 y.o. female with PMH of COPD, GERD, h/o PUD, breast CA s/p surgery, chemo/rad (2014), anemia presented with DOE, fatigue, tiredness and found to have symptomatic anemia  1. Symptomatic anemia microcytic likely IDA: no obvious s/s of active bleeding;  -patient does not take iron as prescribed  -per patient: she had EGD/colon/capsule endoscopy 2014: no active bleeding but showed gastritis -TF two units; check iron profile add on test/pend, occult blood test; need outpatient GI eval   2. COPD no wheezing on exam; cont prn bronchodilators   3. Chronic pain, cont home regimen; monitor for sedation    4. H/o breast CA s/p surgery, chemo/rad (2014) -cont outpatient follow up   5. GERD, h/o PUD; restart PPI; endoscopy as above  Likely d/c in AM if stable   DVT prophylaxis SCD None;  if consultant consulted,  please document name and whether formally or informally consulted  Code Status: full (must indicate code status--if unknown or must be presumed, indicate so) Family Communication: d/w aptient  (indicate person spoken with, if applicable, with phone number if by telephone) Disposition Plan: home 24-48 hours  (indicate anticipated LOS)  Time spent: >35 minutes   Kinnie Feil [facility] Pager [phone]  If 7PM-7AM, please contact night-coverage [email or web address] Password [facility] 11/19/2013, 5:44 PM

## 2013-11-19 NOTE — ED Notes (Signed)
Pt c/o burning with urination x 2 days

## 2013-11-19 NOTE — ED Notes (Signed)
Admitting MD at bedside.

## 2013-11-19 NOTE — Addendum Note (Signed)
Addended by: Amelia Jo I on: 11/19/2013 03:43 PM   Modules accepted: Orders

## 2013-11-20 ENCOUNTER — Other Ambulatory Visit: Payer: Self-pay

## 2013-11-20 ENCOUNTER — Ambulatory Visit: Payer: Self-pay | Admitting: Oncology

## 2013-11-20 ENCOUNTER — Telehealth: Payer: Self-pay | Admitting: *Deleted

## 2013-11-20 ENCOUNTER — Telehealth: Payer: Self-pay | Admitting: Oncology

## 2013-11-20 DIAGNOSIS — D509 Iron deficiency anemia, unspecified: Secondary | ICD-10-CM | POA: Diagnosis present

## 2013-11-20 DIAGNOSIS — D649 Anemia, unspecified: Secondary | ICD-10-CM | POA: Diagnosis not present

## 2013-11-20 DIAGNOSIS — F411 Generalized anxiety disorder: Secondary | ICD-10-CM

## 2013-11-20 DIAGNOSIS — M549 Dorsalgia, unspecified: Secondary | ICD-10-CM | POA: Diagnosis not present

## 2013-11-20 DIAGNOSIS — C50919 Malignant neoplasm of unspecified site of unspecified female breast: Secondary | ICD-10-CM

## 2013-11-20 LAB — CBC
HCT: 29.8 % — ABNORMAL LOW (ref 36.0–46.0)
HEMOGLOBIN: 9 g/dL — AB (ref 12.0–15.0)
MCH: 20.3 pg — ABNORMAL LOW (ref 26.0–34.0)
MCHC: 30.2 g/dL (ref 30.0–36.0)
MCV: 67.1 fL — ABNORMAL LOW (ref 78.0–100.0)
PLATELETS: 330 10*3/uL (ref 150–400)
RBC: 4.44 MIL/uL (ref 3.87–5.11)
RDW: 21.5 % — ABNORMAL HIGH (ref 11.5–15.5)
WBC: 6.3 10*3/uL (ref 4.0–10.5)

## 2013-11-20 MED ORDER — PANTOPRAZOLE SODIUM 40 MG PO TBEC: 40.0000 mg | DELAYED_RELEASE_TABLET | Freq: Every day | ORAL | Status: DC

## 2013-11-20 MED ORDER — FERROUS SULFATE 325 (65 FE) MG PO TABS: 325.0000 mg | ORAL_TABLET | Freq: Three times a day (TID) | ORAL | Status: DC

## 2013-11-20 MED ORDER — ESCITALOPRAM OXALATE 10 MG PO TABS: 10.0000 mg | ORAL_TABLET | Freq: Every day | ORAL | Status: DC

## 2013-11-20 MED ORDER — SENNA 8.6 MG PO TABS: 1.0000 | ORAL_TABLET | Freq: Two times a day (BID) | ORAL | Status: DC

## 2013-11-20 NOTE — Discharge Summary (Signed)
Physician Discharge Summary  Alexandria [name] PNT:[id number] DOB: 05/03/1951 DOA: 11/19/2013  PCP: Nyoka Cowden, MD  Admit date: 11/19/2013 Discharge date: 11/20/2013  Time spent: >35 minutes  Recommendations for Outpatient Follow-up:  F/u with PCP in 1-2 weeks as needed F/u with GI in 2-3 weeks  Discharge Diagnoses:  Principal Problem:   Symptomatic anemia Active Problems:   Breast cancer   Hypertension   Chronic back pain   Morbid obesity   Chronic constipation   Anemia   Discharge Condition: stable   Diet recommendation: heart healthy   Filed Weights   11/19/13 2015  Weight: 106.6 kg (235 lb 0.2 oz)    History of present illness:  Principal Problem:  Symptomatic anemia  Active Problems:  Breast cancer  Hypertension  Chronic back pain  Morbid obesity  Chronic constipation  Anemia   63 y.o. female with PMH of COPD, GERD, h/o PUD, breast CA s/p surgery, chemo/rad (2014), anemia presented with DOE, fatigue, tiredness and found to have symptomatic anemia   Hospital Course:   1. Symptomatic anemia microcytic likely IDA: no obvious s/s of active bleeding;  -patient does not take iron as prescribed at hom e -per patient: she had EGD/colon/capsule endoscopy 2014: no active bleeding but showed gastritis  -TFsed two units; reemphasized medication adherence to take iron pills; recommended outpatient GI eval in 2-3 weeks 2. COPD no wheezing on exam; cont prn bronchodilators  3. Chronic pain, cont home regimen;  4. H/o breast CA s/p surgery, chemo/rad (2014)  -cont outpatient follow up  5. GERD, h/o PUD; restarted PPI; endoscopy as abov e       Procedures:  None  (i.e. Studies not automatically included, echos, thoracentesis, etc; not x-rays)  Consultations:  None   Discharge Exam: Filed Vitals:   11/20/13 0750  BP: 134/74  Pulse: 81  Temp: 97.7 F (36.5 C)  Resp: 20    General: alert Cardiovascular: s1,s2 rrr Respiratory: CTA BL  Discharge  Instructions  Discharge Orders   Future Appointments Provider Department Dept Phone   11/20/2013 12:00 PM [facility] [phone]   11/20/2013 12:30 PM Deatra Robinson, MD [facility] [phone]   12/08/2013 2:00 PM Marletta Lor, MD [place] [facility] [place]   01/08/2014 1:45 PM Lanice Shirts, [place] [phone]   03/27/2014 2:00 PM Eppie Gibson, MD [facility] [phone]   Future Orders Complete By Expires   Diet - low sodium heart healthy  As directed    Discharge instructions  As directed    Comments:     Please follow up with gastroenterologist in 2-3 weeks   Increase activity slowly  As directed        Medication List         anastrozole 1 MG tablet  Commonly known as:  ARIMIDEX  Take 1 mg by mouth daily.     budesonide 180 MCG/ACT inhaler  Commonly known as:  PULMICORT  Inhale 2 puffs into the lungs 2 (two) times daily.     escitalopram 10 MG tablet  Commonly known as:  LEXAPRO  Take 1 tablet (10 mg total) by mouth daily.     ferrous sulfate 325 (65 FE) MG tablet  Commonly known as:  FERROUSUL  Take 1 tablet (325 mg total) by mouth 3 (three) times daily with meals.     furosemide 80 MG tablet  Commonly known  as:  LASIX  Take 80 mg by mouth 3 (three) times daily.     guaiFENesin-codeine 100-10 MG/5ML syrup  Commonly known as:  ROBITUSSIN AC  Take 5 mLs by mouth 3 (three) times daily as needed for cough.     HAIR/SKIN/NAILS PO  Take 1 tablet by mouth every morning.     HYDROmorphone HCl 12 MG T24a SR tablet  Commonly known as:  EXALGO  Take 12 mg by mouth 2 (two) times daily.     HYDROmorphone 4 MG tablet  Commonly known as:  DILAUDID  Take 4 mg by mouth every 4 (four) hours as needed.     levalbuterol 0.63 MG/3ML nebulizer solution  Commonly known as:  XOPENEX  Take 3 mLs (0.63 mg  total) by nebulization every 4 (four) hours as needed for wheezing or shortness of breath.     lidocaine 5 %  Commonly known as:  LIDODERM  Place 1-3 patches onto the skin daily. Remove & Discard patch within 12 hours or as directed by MD     lidocaine-prilocaine cream  Commonly known as:  EMLA  Apply 1 application topically as needed (port access).     LORazepam 1 MG tablet  Commonly known as:  ATIVAN  Take 1 mg by mouth every 8 (eight) hours.     lubiprostone 24 MCG capsule  Commonly known as:  AMITIZA  Take 1 capsule (24 mcg total) by mouth 2 (two) times daily with a meal.     pantoprazole 40 MG tablet  Commonly known as:  PROTONIX  Take 1 tablet (40 mg total) by mouth daily.     potassium chloride SA 20 MEQ tablet  Commonly known as:  K-DUR,KLOR-CON  Take 20-40 mEq by mouth 3 (three) times daily.     PROAIR HFA IN  Inhale 2 Inhalers into the lungs every 4 (four) hours as needed.     senna 8.6 MG Tabs tablet  Commonly known as:  SENOKOT  Take 1 tablet (8.6 mg total) by mouth 2 (two) times daily.       Allergies  Allergen Reactions  . Contrast Media [Iodinated Diagnostic Agents] Anaphylaxis  . Albuterol Other (See Comments)    Agitation   . Prednisone Other (See Comments)    Pt gets very agitated when she takes high doses of steroids       Follow-up Information   Follow up with Nyoka Cowden, MD In 1 week.   Specialty:  Internal Medicine   Contact information:   [place] [address] [phone]        The results of significant diagnostics from this hospitalization (including imaging, microbiology, ancillary and laboratory) are listed below for reference.    Significant Diagnostic Studies: Dg Chest 2 View  11/10/2013   CLINICAL DATA:  63 year old female shortness of Breath. Lymphedema. Initial encounter.  EXAM: CHEST  2 VIEW  COMPARISON:  04/13/2013.  FINDINGS: Chronic left paracentral gastric hernia appears stable, is  moderate in size. Stable cardiac size and mediastinal contours. Visualized tracheal air column is within normal limits. There is a curvilinear density projecting over the left chest and mediastinum which takes the expected course of a left chest subclavian approach porta cath or tunneled catheter. This is stable since 04/13/2013, new since 10/02/2012. No pneumothorax. No pulmonary edema, pleural effusion or acute pulmonary opacity identified. Bibasilar atelectasis suspected.  IMPRESSION: Stable, no acute cardiopulmonary abnormality.  Chronic gastric hiatal hernia. Suggestion of a chronic left chest radiolucent porta  cath or tunneled catheter.   Electronically Signed   By: Lars Pinks M.D.   On: 11/10/2013 14:23   Dg Chest Portable 1 View  11/19/2013   CLINICAL DATA:  Dyspnea on exertion, history of pulmonary embolus  EXAM: PORTABLE CHEST - 1 VIEW  COMPARISON:  11/10/2013  FINDINGS: Cardiomediastinal shift is stable. A left chest chronic PICC line or tunneled catheter fragment again noted. Stable gastric herniation left base. No acute infiltrate or pulmonary edema.  IMPRESSION: A left chest chronic PICC line or tunneled catheter fragment again noted. Stable gastric herniation left base. No acute infiltrate or pulmonary edema.   Electronically Signed   By: Lahoma Crocker M.D.   On: 11/19/2013 16:13    Microbiology: No results found for this or any previous visit (from the past 240 hour(s)).   Labs: Basic Metabolic Panel:  Recent Labs Lab 11/14/13 1113 11/17/13 1059 11/19/13 1610  NA 142 140 144  K 3.6 3.7 3.8  CL  --   --  100  CO2 31* 27 31  GLUCOSE 120 131 104*  BUN 9.9 8.2 14  CREATININE 0.9 0.9 0.93  CALCIUM 9.1 8.8 8.8   Liver Function Tests:  Recent Labs Lab 11/14/13 1113 11/17/13 1059  AST 18 21  ALT 14 16  ALKPHOS 182* 182*  BILITOT <0.20 <0.20  PROT 6.9 6.7  ALBUMIN 3.2* 3.0*   No results found for this basename: LIPASE, AMYLASE,  in the last 168 hours No results found for  this basename: AMMONIA,  in the last 168 hours CBC:  Recent Labs Lab 11/14/13 1113 11/17/13 1059 11/19/13 1430 11/20/13 0941  WBC 5.4 6.5 6.4 6.3  NEUTROABS 3.9 4.8 4.7  --   HGB 7.6* 7.5* 7.7* 9.0*  HCT 25.0* 25.1* 25.7* 29.8*  MCV 62.8* 62.3* 63.8* 67.1*  PLT 337 343 318 330   Cardiac Enzymes: No results found for this basename: CKTOTAL, CKMB, CKMBINDEX, TROPONINI,  in the last 168 hours BNP: BNP (last 3 results)  Recent Labs  04/13/13 1925 04/15/13 0517  PROBNP 1218.0* 825.2*   CBG: No results found for this basename: GLUCAP,  in the last 168 hours     Signed:  Rowe Clack N  [facility] 11/20/2013, 10:35 AM

## 2013-11-20 NOTE — Progress Notes (Signed)
Attempted to access pt.s left chest PAC without success. Had 2nd IV nurse to attempt to access port without success.Primary nurse notified. Pt. Presently has 1 PIV site.  Wynelle Cleveland RN VAST.

## 2013-11-20 NOTE — Telephone Encounter (Signed)
Received call from Donne Hazel, son, who lives in [place], [place]., and wants to know how his mom is doing. Patient was discharged from [facility] this morning. Appointments at [facility] were cancelled because she was hospitalized. I informed son of the situation and let him know that Dr. Humphrey Rolls didn't admit her. Left a voice mail on his cell phone. Cell # is [phone]. Told him to call back if he had any further questions.

## 2013-11-20 NOTE — Telephone Encounter (Signed)
Wanted MD aware that she is ready for discharge and thinks she is to come to cancer center for blood today/see MD. Hgb after transfusion in hospital is 9.0. Spoke with Dr. Laurelyn Sickle nurse, Amelia Jo, RN: Appointment today was only for TXM and to talk with MD about why she is unable to transfuse same day as the TXM-she has developed multiple antibodies which delays ability to obtain matched blood. She does not need to come to office today for lab/OV. Once D/C physician gives OK, she can go home. Our office will call her with next lab/MD visit appointment.

## 2013-11-20 NOTE — Progress Notes (Signed)
[facility]  Alexandria [name] JWL:[id number] DOB: Oct 23, 1950 DOA: 11/17/2013  Addendum to discharge summary:  Called in levofloxacin 500 mg Po for 5 days for UTI;  -informed patient, she will pick up prescription from [facility]  -recommended to f/u with PCP in 1 week  If 7PM-7AM, please contact night-coverage [email or web address] Password [email or web address] 11/20/2013, 2:28 PM

## 2013-11-20 NOTE — Telephone Encounter (Signed)
, °

## 2013-11-21 LAB — TYPE AND SCREEN
ABO/RH(D): O POS
Antibody Screen: POSITIVE
DAT, IgG: NEGATIVE
UNIT DIVISION: 0
Unit division: 0

## 2013-11-24 ENCOUNTER — Encounter: Payer: Self-pay | Admitting: Physical Therapy

## 2013-11-24 ENCOUNTER — Ambulatory Visit: Payer: Self-pay | Admitting: Internal Medicine

## 2013-11-24 ENCOUNTER — Telehealth: Payer: Self-pay | Admitting: Internal Medicine

## 2013-11-24 ENCOUNTER — Telehealth: Payer: Self-pay | Admitting: Radiation Oncology

## 2013-11-24 NOTE — Telephone Encounter (Signed)
Received call from patient. Patient scheduled to follow up with Dr.Squire on 7/10. Patient believe a follow up mammogram needs to be scheduled and if she waits until 7/10 it will have been one year since her last mammogram. Explained to the patient this writer would inform Dr. Isidore Moos of her concerns and call her back with further directions.

## 2013-11-24 NOTE — Telephone Encounter (Signed)
[facility] Triage Call Report Triage Record Num: [id number] Operator: [name] Patient Name: Alexandria [name] Call Date & Time: 11/23/2013 8:26:00PM Patient Phone: [phone] PCP: Marcy Panning Patient Gender: Female PCP Fax : [phone] Patient DOB: 1950-11-30 Practice Name: [facility] Reason for Call: Caller: [name]/Patient; PCP: Bluford Kaufmann (Family Practice > 48yrs old); CB#: [phone]; Call regarding Aches since having blood transfusion; Caller states that on Wedsday 3/4 recieved blood transfusion s/t anemia. Hbg was ever 6.7 or 7 per caller. Recieved 2units PRBCs, has recieved blood products before for chronic anemia. However states now with joint pains and aches, >20lb weight gain, and weakness/fatigue. Denies fever but states "ive never felt like this after a transfusion". Denies severe shortness of breath but states really tired. Triaged per Fatigue. Disposition per " new unexplained or worsening fatigue and weight gain of 5lbs or more in one week" is to see provider within 24 hours. Advised patient to be seen in office tomorrow 3/9. Patient stating that she would need to have her nurse bring her in, therefore will call office in the AM for scheduling. Caller agreeable to plan and verbalizes understanding. Protocol(s) Used: Fatigue Recommended Outcome per Protocol: See Provider within 24 hours Reason for Outcome: New unexplained or worsening fatigue and weight gain of 5 lbs. or more in one week Care Advice: ~ Avoid any activity that produces symptoms until evaluated by provider. Go to ED IMMEDIATELY if developing increased shortness of breath, continuous cough, worsening fatigue, or unable to perform ADLs. ~ ~ SYMPTOM / CONDITION MANAGEMENT ~ CAUTIONS 03/

## 2013-11-26 ENCOUNTER — Encounter: Payer: Self-pay | Admitting: Physical Therapy

## 2013-11-27 ENCOUNTER — Ambulatory Visit (INDEPENDENT_AMBULATORY_CARE_PROVIDER_SITE_OTHER): Payer: Managed Care, Other (non HMO) | Admitting: Family Medicine

## 2013-11-27 ENCOUNTER — Encounter: Payer: Self-pay | Admitting: Family Medicine

## 2013-11-27 ENCOUNTER — Telehealth: Payer: Self-pay

## 2013-11-27 VITALS — BP 140/80 | HR 83

## 2013-11-27 DIAGNOSIS — D649 Anemia, unspecified: Secondary | ICD-10-CM

## 2013-11-27 DIAGNOSIS — Z452 Encounter for adjustment and management of vascular access device: Secondary | ICD-10-CM

## 2013-11-27 LAB — CBC WITH DIFFERENTIAL/PLATELET
BASOS PCT: 0.1 % (ref 0.0–3.0)
Basophils Absolute: 0 10*3/uL (ref 0.0–0.1)
EOS PCT: 1.4 % (ref 0.0–5.0)
Eosinophils Absolute: 0.1 10*3/uL (ref 0.0–0.7)
HCT: 29.9 % — ABNORMAL LOW (ref 36.0–46.0)
Hemoglobin: 9.5 g/dL — ABNORMAL LOW (ref 12.0–15.0)
Lymphocytes Relative: 19.6 % (ref 12.0–46.0)
Lymphs Abs: 1 10*3/uL (ref 0.7–4.0)
MCHC: 31.7 g/dL (ref 30.0–36.0)
MCV: 65.8 fl — ABNORMAL LOW (ref 78.0–100.0)
MONOS PCT: 9.6 % (ref 3.0–12.0)
Monocytes Absolute: 0.5 10*3/uL (ref 0.1–1.0)
NEUTROS PCT: 69.3 % (ref 43.0–77.0)
Neutro Abs: 3.6 10*3/uL (ref 1.4–7.7)
PLATELETS: 251 10*3/uL (ref 150.0–400.0)
RBC: 4.54 Mil/uL (ref 3.87–5.11)
RDW: 25.2 % — ABNORMAL HIGH (ref 11.5–14.6)
WBC: 5.2 10*3/uL (ref 4.5–10.5)

## 2013-11-27 NOTE — Progress Notes (Signed)
Subjective:    Patient ID: Alexandria [name], female    DOB: [birth year], 63 y.o.   MRN: [id number]  HPI Hospital followup. Patient was admitted on 11/19/2013 discharged on 11/20/2013.  She has a long history of anemia and had presented with increased dyspnea on exertion and fatigue and was found to have symptomatic anemia. Her chronic problems include history of GERD, remote history of peptic ulcer disease, breast cancer. She had microcytic anemia with hemoglobin 7.7 and this increased to 9.0 after transfusion of 2 units of packed red blood cells. Hemoccults were reportedly negative. History is that she had EGD and colonoscopy along with capsule endoscopy 2014 when she lived in [place] with no evidence for active bleeding source found. She does recently started back on iron after recent admission. She did have labs compatible with iron deficiency anemia with low iron, elevated TIBC, and ferritin of 3.  Feels much better at this time. No dizziness. Less fatigue. Less dyspnea on exertion. She has history of GERD and was started back on Protonix 40 mg daily and she just started back on this today. She denies abdominal pain. No melena. No hematemesis. Patient states this is the fifth time she has been transfused. She is followed by hematologist in [facility] but they never could uncover a source of her bleeding. She apparently had iron transfusion once previously. She has not establish with local gastroenterologist.  Patient has a Port-A-Cath left chest wall which was unsuccessfully removed during recent hospitalization. It was suggested she see interventional radiology.   Past Medical History  Diagnosis Date  . Iron deficiency anemia   . Status post chemotherapy     4 cycles of Taxotere and cytoxan  . Chronic back pain   . Hx of pulmonary embolus     During c-Section  . Neuropathy   . Obesity     Class 2  . COPD (chronic obstructive pulmonary disease)   . S/P radiation therapy   03/04/2013-04/17/2013    1) Right breast / 50 Gy in 25 fractions/ 2) Right breast boost / 10 Gy in 5 fractions  . Diastolic heart failure   . Hx of radiation therapy 03/04/13- 04/17/13    right breast 50 Gy 25 fractions, right breast boost 10 Gy 5 fractions  . 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 Cesarean section    . Right breast lumpectomy      reports that she has never smoked. She has never used smokeless tobacco. She reports that she does not drink alcohol or use illicit drugs. family history includes Parkinson's disease in her mother. Allergies  Allergen Reactions  . Contrast Media [Iodinated Diagnostic Agents] Anaphylaxis  . Albuterol Other (See Comments)    Agitation   . Prednisone Other (See Comments)    Pt gets very agitated when she takes high doses of steroids      Review of Systems  Constitutional: Positive for fatigue. Negative for appetite change and unexpected weight change.  Eyes: Negative for visual disturbance.  Respiratory: Negative for cough, chest tightness, shortness of breath and wheezing.   Cardiovascular: Positive for leg swelling (relatively chronic and stable). Negative for chest pain and palpitations.  Neurological: Negative for dizziness, seizures, syncope, weakness, light-headedness and headaches.       Objective:   Physical Exam  Constitutional:  She appears well-developed and well-nourished. No distress.  Cardiovascular: Normal rate.   Pulmonary/Chest: Effort normal and breath sounds normal. No respiratory distress. She has no wheezes. She has no rales.  Abdominal: Soft. She exhibits no distension and no mass. There is no tenderness. There is no rebound and no guarding.  Musculoskeletal:  Trace nonpitting edema legs bilaterally  Neurological: She is alert.          Assessment & Plan:   Microcytic anemia. Extensive workup as above previously negative for source. Symptomatically improved following recent transfusion. Recent ferritin 3. She is strongly advised to maintain regular iron intake. Followup with primary in one month and consider repeat CBC and ferritin at that time. Repeat CBC today. She does not have evidence for ongoing acute bleed  We'll refer to interventional radiology for assistance with removal of Port-A-Cath.

## 2013-11-27 NOTE — Telephone Encounter (Signed)
Please have patient see Alexandria [name] as soon as possible this week or early next week

## 2013-11-27 NOTE — Telephone Encounter (Signed)
Pt c/o pain in right breast - new lump on top right of breast constant ache with intermittent dull sharp pains and change in pigmentation.  She would like to be seen by MD.

## 2013-11-27 NOTE — Progress Notes (Signed)
Pre visit review using our clinic review tool, if applicable. No additional management support is needed unless otherwise documented below in the visit note. 

## 2013-11-27 NOTE — Patient Instructions (Signed)
We will set up interventional radiology referral.

## 2013-11-28 ENCOUNTER — Telehealth: Payer: Self-pay | Admitting: Oncology

## 2013-11-28 NOTE — Progress Notes (Signed)
Call Documentation      Deatra Robinson, MD at 11/27/2013  4:58 PM      Status: Signed            Please have patient see [place] as soon as possible this week or early next week         Prentiss Bells, RN at 11/27/2013  3:40 PM      Status: Signed            Pt c/o pain in right breast - new lump on top right of breast constant ache with intermittent dull sharp pains and change in pigmentation.  She would like to be seen by MD.

## 2013-11-28 NOTE — Telephone Encounter (Signed)
S/w the pt and she is aware of her appt on Monday 12/01/2013@1 :15pm.

## 2013-12-01 ENCOUNTER — Telehealth: Payer: Self-pay | Admitting: *Deleted

## 2013-12-01 ENCOUNTER — Other Ambulatory Visit: Payer: Self-pay | Admitting: Radiation Oncology

## 2013-12-01 ENCOUNTER — Ambulatory Visit (HOSPITAL_BASED_OUTPATIENT_CLINIC_OR_DEPARTMENT_OTHER): Payer: Managed Care, Other (non HMO)

## 2013-12-01 ENCOUNTER — Encounter: Payer: Self-pay | Admitting: Physical Therapy

## 2013-12-01 ENCOUNTER — Ambulatory Visit (HOSPITAL_BASED_OUTPATIENT_CLINIC_OR_DEPARTMENT_OTHER): Payer: Managed Care, Other (non HMO) | Admitting: Adult Health

## 2013-12-01 ENCOUNTER — Encounter: Payer: Self-pay | Admitting: Adult Health

## 2013-12-01 VITALS — BP 110/70 | HR 130 | Temp 98.0°F | Resp 20

## 2013-12-01 DIAGNOSIS — D649 Anemia, unspecified: Secondary | ICD-10-CM

## 2013-12-01 DIAGNOSIS — C50419 Malignant neoplasm of upper-outer quadrant of unspecified female breast: Secondary | ICD-10-CM

## 2013-12-01 DIAGNOSIS — N644 Mastodynia: Secondary | ICD-10-CM | POA: Diagnosis not present

## 2013-12-01 DIAGNOSIS — IMO0002 Reserved for concepts with insufficient information to code with codable children: Secondary | ICD-10-CM

## 2013-12-01 LAB — CBC WITH DIFFERENTIAL/PLATELET
BASO%: 0.3 % (ref 0.0–2.0)
BASOS ABS: 0 10*3/uL (ref 0.0–0.1)
EOS ABS: 0.2 10*3/uL (ref 0.0–0.5)
EOS%: 1.6 % (ref 0.0–7.0)
HEMATOCRIT: 30.6 % — AB (ref 34.8–46.6)
HGB: 9.4 g/dL — ABNORMAL LOW (ref 11.6–15.9)
LYMPH%: 8.5 % — AB (ref 14.0–49.7)
MCH: 20.1 pg — ABNORMAL LOW (ref 25.1–34.0)
MCHC: 30.8 g/dL — ABNORMAL LOW (ref 31.5–36.0)
MCV: 65.2 fL — ABNORMAL LOW (ref 79.5–101.0)
MONO#: 0.9 10*3/uL (ref 0.1–0.9)
MONO%: 6.9 % (ref 0.0–14.0)
NEUT%: 82.7 % — AB (ref 38.4–76.8)
NEUTROS ABS: 10.4 10*3/uL — AB (ref 1.5–6.5)
PLATELETS: 296 10*3/uL (ref 145–400)
RBC: 4.69 10*6/uL (ref 3.70–5.45)
RDW: 25.8 % — ABNORMAL HIGH (ref 11.2–14.5)
WBC: 12.6 10*3/uL — ABNORMAL HIGH (ref 3.9–10.3)
lymph#: 1.1 10*3/uL (ref 0.9–3.3)

## 2013-12-01 LAB — LACTATE DEHYDROGENASE (CC13): LDH: 266 U/L — ABNORMAL HIGH (ref 125–245)

## 2013-12-01 LAB — COMPREHENSIVE METABOLIC PANEL (CC13)
ALT: 18 U/L (ref 0–55)
ANION GAP: 14 meq/L — AB (ref 3–11)
AST: 22 U/L (ref 5–34)
Albumin: 3.5 g/dL (ref 3.5–5.0)
Alkaline Phosphatase: 187 U/L — ABNORMAL HIGH (ref 40–150)
BUN: 17.5 mg/dL (ref 7.0–26.0)
CO2: 29 meq/L (ref 22–29)
Calcium: 9.5 mg/dL (ref 8.4–10.4)
Chloride: 98 mEq/L (ref 98–109)
Creatinine: 1.5 mg/dL — ABNORMAL HIGH (ref 0.6–1.1)
GLUCOSE: 111 mg/dL (ref 70–140)
Potassium: 3.4 mEq/L — ABNORMAL LOW (ref 3.5–5.1)
SODIUM: 140 meq/L (ref 136–145)
TOTAL PROTEIN: 7.6 g/dL (ref 6.4–8.3)

## 2013-12-01 LAB — IRON AND TIBC CHCC
%SAT: 8 % — ABNORMAL LOW (ref 21–57)
Iron: 34 ug/dL — ABNORMAL LOW (ref 41–142)
TIBC: 430 ug/dL (ref 236–444)
UIBC: 396 ug/dL — ABNORMAL HIGH (ref 120–384)

## 2013-12-01 LAB — FERRITIN CHCC: Ferritin: 12 ng/ml (ref 9–269)

## 2013-12-01 LAB — HAPTOGLOBIN: HAPTOGLOBIN: 171 mg/dL (ref 45–215)

## 2013-12-01 NOTE — Progress Notes (Signed)
OFFICE PROGRESS NOTE  CC**  Alexandria Cowden, MD [address] [place] [address] Dr. 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 Patient was originally diagnosed in [place]:  Malignant neoplasm of upper-outer quadrant of female breast  Primary site: Breast (Right)  Staging method: AJCC 7th Edition  Pathologic: Stage IA (T1, N0, cM0) signed by Deatra Robinson, MD on 03/26/2013 8:21 AM  Summary: Stage IA (T1, N0, cM0)  ER positive, PR positive HER-2/neu negative  Oncotype DX recurrence score 23, 10 year risk intermediate  PRIOR THERAPY: #1January 2014 had a mammogram performed that showed an abnormality in the right breast. This was in [place]. 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 Patient went on to have a right lumpectomy performed revealing a T1 C. N0 disease stage at diagnosis stage I  #2Oncotype DX performed that showed a recurrence score of 23 giving her a 10 year risk for recurrence but 10-30% in the intermediate risk category. Because of this she received adjuvant chemotherapy consisting of 4 cycles of Taxotere and Cytoxan  #3 patient is now status post radiation therapy to the  Right breast administered by Dr. Eppie Gibson from 03/04/2013 through 04/17/2013.  #4 patient began antiestrogen therapy with curative intent consisting of Arimidex 1 mg daily for a total of 5 years in August, 2014. Risks benefits of this were discussed with the patient.  CURRENT THERAPY: Arimidex 1 mg daily starting 05/16/2013  INTERVAL HISTORY: Alexandria [name] 63 y.o. female returns for an urgent visit due to right breast pain and skin changes.  This has been going on for a week or two.  She states that she has pain  in her right breast near her surgical site and she has increased pain with palpation.  She feels like the skin is lighter than usual, but she denies dimpling in the skin or changes in her right nipple.  She does note that she has some very mild scaling on her left nipple, and cannot tell me exactly how long its been going on.  She has a h/o anemia and most recently needed to be transfused and there was difficulty with typing her blood.  She states she has a h/o iron deficiency anemia.  She also wants her port removed as soon as possible.  Otherwise, a 10 point ROS is neg.   MEDICAL HISTORY: Past Medical History  Diagnosis Date  . Iron deficiency anemia   . Status post chemotherapy     4 cycles of Taxotere and cytoxan  . Chronic back pain   . Hx of pulmonary embolus     During c-Section  . Neuropathy   . Obesity     Class 2  . COPD (chronic obstructive pulmonary disease)   . S/P radiation therapy  03/04/2013-04/17/2013    1) Right breast / 50 Gy in 25 fractions/ 2) Right breast boost / 10 Gy in 5 fractions  . Diastolic heart failure   . Hx of radiation therapy 03/04/13- 04/17/13    right breast 50 Gy 25 fractions, right breast boost 10 Gy 5 fractions  . 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  MEDICATIONS:  Current Outpatient Prescriptions  Medication Sig Dispense Refill  . Albuterol Sulfate (PROAIR HFA IN) Inhale 2 Inhalers into the lungs every 4 (four) hours as needed.      Marland Kitchen anastrozole (ARIMIDEX) 1 MG tablet Take 1 mg by mouth daily.      . budesonide (PULMICORT) 180 MCG/ACT inhaler Inhale 2 puffs into the lungs 2 (two) times daily.  1 Inhaler  6  . escitalopram (LEXAPRO) 10 MG tablet Take 1 tablet (10 mg total) by mouth daily.  30 tablet  1  . ferrous sulfate (FERROUSUL) 325 (65 FE) MG tablet Take 1 tablet (325  mg total) by mouth 3 (three) times daily with meals.  90 tablet  3  . furosemide (LASIX) 80 MG tablet Take 80 mg by mouth 3 (three) times daily.       Marland Kitchen HYDROmorphone (DILAUDID) 4 MG tablet Take 4 mg by mouth every 6 (six) hours as needed.       Marland Kitchen HYDROmorphone HCl (EXALGO) 12 MG T24A SR tablet Take 12 mg by mouth 2 (two) times daily.      Marland Kitchen lidocaine-prilocaine (EMLA) cream Apply 1 application topically as needed (port access).       . lubiprostone (AMITIZA) 24 MCG capsule Take 1 capsule (24 mcg total) by mouth 2 (two) times daily with a meal.  60 capsule  2  . Multiple Vitamins-Minerals (HAIR/SKIN/NAILS PO) Take 1 tablet by mouth every morning.      . pantoprazole (PROTONIX) 40 MG tablet Take 1 tablet (40 mg total) by mouth daily.  30 tablet  0  . potassium chloride SA (K-DUR,KLOR-CON) 20 MEQ tablet Take 20-40 mEq by mouth 3 (three) times daily.      Marland Kitchen guaiFENesin-codeine (ROBITUSSIN AC) 100-10 MG/5ML syrup Take 5 mLs by mouth 3 (three) times daily as needed for cough.  120 mL  0  . levalbuterol (XOPENEX) 0.63 MG/3ML nebulizer solution Take 3 mLs (0.63 mg total) by nebulization every 4 (four) hours as needed for wheezing or shortness of breath.  3 mL  12  . lidocaine (LIDODERM) 5 % Place 1-3 patches onto the skin daily. Remove & Discard patch within 12 hours or as directed by MD      . LORazepam (ATIVAN) 1 MG tablet Take 1 mg by mouth every 8 (eight) hours.      . senna (SENOKOT) 8.6 MG TABS tablet Take 1 tablet (8.6 mg total) by mouth 2 (two) times daily.  120 each  0   No current facility-administered medications for this visit.    SURGICAL HISTORY:  Past Surgical History  Procedure Laterality Date  . Cesarean section    . Right breast lumpectomy      REVIEW OF SYSTEMS:  A 10 point review of systems was conducted and is otherwise negative except for what is noted above.     PHYSICAL EXAMINATION: Blood pressure 110/70, pulse 130, temperature 98 F (36.7 C), temperature source Oral,  resp. rate 20, weight 0 lb (0 kg), last menstrual period 01/30/2005. Body mass index is 0.00 kg/(m^2). GENERAL: Patient is a well appearing female in no acute distress HEENT:  Sclerae anicteric.  Oropharynx clear and moist. No ulcerations or evidence of oropharyngeal candidiasis. Neck is supple.  NODES:  No cervical, supraclavicular, or axillary lymphadenopathy palpated.  BREAST EXAM:  Right breast s/p lumpectomy, no nodule or mass, though very tender to palpation, no redness swelling, left breast with  small crusting/scaling on nipple, no mass or nodule. LUNGS:  Clear to auscultation bilaterally.  No wheezes or rhonchi. HEART:  Regular rate and rhythm. No murmur appreciated. ABDOMEN:  Soft, nontender.  Positive, normoactive bowel sounds. No organomegaly palpated. MSK:  No focal spinal tenderness to palpation. Full range of motion bilaterally in the upper extremities. EXTREMITIES:  No peripheral edema.   SKIN:  Clear with no obvious rashes or skin changes. No nail dyscrasia. NEURO:  Nonfocal. Well oriented.  Appropriate affect. ECOG PERFORMANCE STATUS: 0 - Asymptomatic   LABORATORY DATA: Lab Results  Component Value Date   WBC 12.6* 12/01/2013   HGB 9.4* 12/01/2013   HCT 30.6* 12/01/2013   MCV 65.2* 12/01/2013   PLT 296 12/01/2013      Chemistry      Component Value Date/Time   NA 144 11/19/2013 1610   NA 140 11/17/2013 1059   K 3.8 11/19/2013 1610   K 3.7 11/17/2013 1059   CL 100 11/19/2013 1610   CL 99 03/04/2013 1239   CO2 31 11/19/2013 1610   CO2 27 11/17/2013 1059   BUN 14 11/19/2013 1610   BUN 8.2 11/17/2013 1059   CREATININE 0.93 11/19/2013 1610   CREATININE 0.9 11/17/2013 1059      Component Value Date/Time   CALCIUM 8.8 11/19/2013 1610   CALCIUM 8.8 11/17/2013 1059   ALKPHOS 182* 11/17/2013 1059   ALKPHOS 120* 04/13/2013 2025   AST 21 11/17/2013 1059   AST 23 04/13/2013 2025   ALT 16 11/17/2013 1059   ALT 18 04/13/2013 2025   BILITOT <0.20 11/17/2013 1059   BILITOT 0.3 04/13/2013 2025        RADIOGRAPHIC STUDIES:  No results found.  ASSESSMENT: 63 year old female with  #1 stage I invasive ductal carcinoma of the right breast status post lumpectomy followed by adjuvant chemotherapy consisting of 4 cycles of Taxotere and Cytoxan administered in [place].  #2 status post completion of radiation therapy administered by Dr. Eppie Gibson to the right breast completed July 2014.  #3 patient began adjuvant antiestrogen therapy with Arimidex 1 mg daily in August, 2014.   PLAN:   #1Due to the breast pain and very small amount of crusting on the left nipple I ordered a bilateral diagnostic mammogram and the discussed the possibility of her following up with a  Surgeon.  In discussing this with her, she began crying and stated that she is very anxious.  I recommended she f/u with her PCP about her anxiety and about possibly trying an SSRI for her anxiety.    #2  Her anemia was discussed with me by Dr. Humphrey Rolls.  The etiology is unclear, therefore we will add addition testing to today's labs.  I requested a f/u appt with Dr. Alvy Bimler per Dr. Humphrey Rolls for any additional recommendations.   #3  The patient desires her port to be removed, it has been very difficult for nurses to use in the hospital and she does not want it anymore.  I put in an order today for IR guided port removal.    All questions were answered. The patient knows to call the clinic with any problems, questions or concerns. We can certainly see the patient much sooner if necessary.  I spent 25 minutes counseling the patient face to face. The total time spent in the appointment was 30 minutes.  Minette Headland, [place] [phone]

## 2013-12-01 NOTE — Telephone Encounter (Signed)
appts made and printed. Pt is aware that i faxed her mammo order due to [facility] phone line busy. Pt is aware that cs will call w/ port removal.....td

## 2013-12-02 ENCOUNTER — Telehealth: Payer: Self-pay | Admitting: *Deleted

## 2013-12-02 NOTE — Telephone Encounter (Signed)
Called patient to inform of mammogram appt. For 12-10-13- arrival time - 1:15 pm @ [facility], spoke with patient and she is aware of this appt.

## 2013-12-03 ENCOUNTER — Other Ambulatory Visit: Payer: Self-pay | Admitting: Family Medicine

## 2013-12-03 ENCOUNTER — Other Ambulatory Visit: Payer: Self-pay | Admitting: Internal Medicine

## 2013-12-03 DIAGNOSIS — G894 Chronic pain syndrome: Secondary | ICD-10-CM | POA: Diagnosis not present

## 2013-12-03 DIAGNOSIS — Z452 Encounter for adjustment and management of vascular access device: Secondary | ICD-10-CM

## 2013-12-03 DIAGNOSIS — IMO0002 Reserved for concepts with insufficient information to code with codable children: Secondary | ICD-10-CM | POA: Diagnosis not present

## 2013-12-03 DIAGNOSIS — M5137 Other intervertebral disc degeneration, lumbosacral region: Secondary | ICD-10-CM | POA: Diagnosis not present

## 2013-12-04 ENCOUNTER — Telehealth: Payer: Self-pay

## 2013-12-04 ENCOUNTER — Other Ambulatory Visit: Payer: Self-pay | Admitting: Adult Health

## 2013-12-04 NOTE — Progress Notes (Signed)
Called patient and discussed lab results.  Discussed feraheme with patient after reviewing with Dr. Humphrey Rolls.  Patient will come in on 3/24 for feraheme.  She will also return to see [place] in 3 months or sooner.  Minette Headland, [place] [phone]

## 2013-12-04 NOTE — Telephone Encounter (Signed)
Pt informed

## 2013-12-04 NOTE — Telephone Encounter (Signed)
Pt would blood work results °

## 2013-12-05 ENCOUNTER — Telehealth: Payer: Self-pay | Admitting: *Deleted

## 2013-12-05 ENCOUNTER — Telehealth: Payer: Self-pay | Admitting: Oncology

## 2013-12-05 NOTE — Telephone Encounter (Signed)
, °

## 2013-12-05 NOTE — Telephone Encounter (Signed)
Patient called and moved to the afternoon for appt on 3/24

## 2013-12-08 ENCOUNTER — Ambulatory Visit: Payer: Self-pay | Admitting: Internal Medicine

## 2013-12-09 ENCOUNTER — Other Ambulatory Visit: Payer: Self-pay | Admitting: Adult Health

## 2013-12-09 ENCOUNTER — Ambulatory Visit (HOSPITAL_BASED_OUTPATIENT_CLINIC_OR_DEPARTMENT_OTHER): Payer: Managed Care, Other (non HMO)

## 2013-12-09 VITALS — BP 152/69 | HR 76 | Temp 97.9°F | Resp 20

## 2013-12-09 DIAGNOSIS — D509 Iron deficiency anemia, unspecified: Secondary | ICD-10-CM

## 2013-12-09 DIAGNOSIS — E860 Dehydration: Secondary | ICD-10-CM

## 2013-12-09 DIAGNOSIS — D649 Anemia, unspecified: Secondary | ICD-10-CM

## 2013-12-09 MED ORDER — SODIUM CHLORIDE 0.9 % IV SOLN
Freq: Once | INTRAVENOUS | Status: AC
  Administered 2013-12-09: 14:00:00 via INTRAVENOUS

## 2013-12-09 MED ORDER — SODIUM CHLORIDE 0.9 % IV SOLN
1020.0000 mg | Freq: Once | INTRAVENOUS | Status: AC
  Administered 2013-12-09: 1020 mg via INTRAVENOUS
  Filled 2013-12-09: qty 34

## 2013-12-09 MED ORDER — LORAZEPAM 2 MG/ML IJ SOLN
0.5000 mg | Freq: Once | INTRAMUSCULAR | Status: AC
  Administered 2013-12-09: 0.5 mg via INTRAVENOUS

## 2013-12-09 MED ORDER — LORAZEPAM 2 MG/ML IJ SOLN
INTRAMUSCULAR | Status: AC
  Filled 2013-12-09: qty 1

## 2013-12-09 NOTE — Patient Instructions (Signed)

## 2013-12-09 NOTE — Progress Notes (Signed)
1430-Pt here for feraheme infusion. Pt stated she has a port but we do not have a chest x-ray that verifies tip placement. Pt had a chest x-ray on 11/19/2013 and it was read by Dr. Eustace Quail. I spoke with Dr. Eustace Quail and he said there is a fragment of a catheter but not a working port. He states this port should not be used. I did find a hospital note that stated there had been an unsuccessful removal of port. This information was given to Jonelle Sports, RN. She will let pt know she can not use the port. I spoke with pt and she states no one has tried to take the port out but the IV team could not access the port while she was in the hospital.  1500- Dr. Eustace Quail called back and had another radiologist review film. He states the tip is in the mid SVC. He is going to make an addendum to the chest x-ray. Cameo notified and she will discuss with pt.

## 2013-12-10 ENCOUNTER — Ambulatory Visit
Admission: RE | Admit: 2013-12-10 | Discharge: 2013-12-10 | Disposition: A | Discharge status: Home / Self Care | Payer: Medicare Other | Source: Ambulatory Visit | Attending: Adult Health | Admitting: Adult Health

## 2013-12-10 DIAGNOSIS — N644 Mastodynia: Secondary | ICD-10-CM

## 2013-12-10 DIAGNOSIS — R922 Inconclusive mammogram: Secondary | ICD-10-CM | POA: Diagnosis not present

## 2013-12-11 ENCOUNTER — Ambulatory Visit (HOSPITAL_COMMUNITY): Payer: Self-pay

## 2013-12-11 ENCOUNTER — Encounter (HOSPITAL_COMMUNITY): Payer: Self-pay | Admitting: Emergency Medicine

## 2013-12-11 ENCOUNTER — Other Ambulatory Visit: Payer: Self-pay

## 2013-12-11 ENCOUNTER — Emergency Department (HOSPITAL_COMMUNITY): Payer: Managed Care, Other (non HMO)

## 2013-12-11 ENCOUNTER — Emergency Department (HOSPITAL_COMMUNITY)
Admission: EM | Admit: 2013-12-11 | Discharge: 2013-12-11 | Disposition: A | Discharge status: Home / Self Care | Payer: Managed Care, Other (non HMO) | Attending: Emergency Medicine | Admitting: Emergency Medicine

## 2013-12-11 ENCOUNTER — Telehealth (INDEPENDENT_AMBULATORY_CARE_PROVIDER_SITE_OTHER): Payer: Self-pay | Admitting: Surgery

## 2013-12-11 ENCOUNTER — Other Ambulatory Visit (HOSPITAL_COMMUNITY): Payer: Self-pay

## 2013-12-11 DIAGNOSIS — Z9221 Personal history of antineoplastic chemotherapy: Secondary | ICD-10-CM | POA: Insufficient documentation

## 2013-12-11 DIAGNOSIS — Z923 Personal history of irradiation: Secondary | ICD-10-CM | POA: Insufficient documentation

## 2013-12-11 DIAGNOSIS — R1084 Generalized abdominal pain: Secondary | ICD-10-CM | POA: Diagnosis not present

## 2013-12-11 DIAGNOSIS — IMO0002 Reserved for concepts with insufficient information to code with codable children: Secondary | ICD-10-CM | POA: Insufficient documentation

## 2013-12-11 DIAGNOSIS — K5909 Other constipation: Secondary | ICD-10-CM

## 2013-12-11 DIAGNOSIS — J441 Chronic obstructive pulmonary disease with (acute) exacerbation: Secondary | ICD-10-CM | POA: Insufficient documentation

## 2013-12-11 DIAGNOSIS — K429 Umbilical hernia without obstruction or gangrene: Secondary | ICD-10-CM

## 2013-12-11 DIAGNOSIS — Z853 Personal history of malignant neoplasm of breast: Secondary | ICD-10-CM | POA: Insufficient documentation

## 2013-12-11 DIAGNOSIS — R112 Nausea with vomiting, unspecified: Secondary | ICD-10-CM | POA: Insufficient documentation

## 2013-12-11 DIAGNOSIS — Z79899 Other long term (current) drug therapy: Secondary | ICD-10-CM | POA: Insufficient documentation

## 2013-12-11 DIAGNOSIS — I5030 Unspecified diastolic (congestive) heart failure: Secondary | ICD-10-CM | POA: Insufficient documentation

## 2013-12-11 DIAGNOSIS — G8929 Other chronic pain: Secondary | ICD-10-CM | POA: Insufficient documentation

## 2013-12-11 DIAGNOSIS — E663 Overweight: Secondary | ICD-10-CM | POA: Insufficient documentation

## 2013-12-11 DIAGNOSIS — R142 Eructation: Secondary | ICD-10-CM | POA: Insufficient documentation

## 2013-12-11 DIAGNOSIS — R143 Flatulence: Secondary | ICD-10-CM

## 2013-12-11 DIAGNOSIS — Z86711 Personal history of pulmonary embolism: Secondary | ICD-10-CM | POA: Insufficient documentation

## 2013-12-11 DIAGNOSIS — R141 Gas pain: Secondary | ICD-10-CM | POA: Insufficient documentation

## 2013-12-11 DIAGNOSIS — R609 Edema, unspecified: Secondary | ICD-10-CM | POA: Insufficient documentation

## 2013-12-11 DIAGNOSIS — R109 Unspecified abdominal pain: Secondary | ICD-10-CM

## 2013-12-11 DIAGNOSIS — D509 Iron deficiency anemia, unspecified: Secondary | ICD-10-CM | POA: Insufficient documentation

## 2013-12-11 LAB — URINALYSIS, ROUTINE W REFLEX MICROSCOPIC
Bilirubin Urine: NEGATIVE
GLUCOSE, UA: NEGATIVE mg/dL
Hgb urine dipstick: NEGATIVE
KETONES UR: NEGATIVE mg/dL
NITRITE: NEGATIVE
PROTEIN: NEGATIVE mg/dL
Specific Gravity, Urine: 1.023 (ref 1.005–1.030)
Urobilinogen, UA: 0.2 mg/dL (ref 0.0–1.0)
pH: 6 (ref 5.0–8.0)

## 2013-12-11 LAB — CBC WITH DIFFERENTIAL/PLATELET
Basophils Absolute: 0 10*3/uL (ref 0.0–0.1)
Basophils Relative: 0 % (ref 0–1)
EOS PCT: 2 % (ref 0–5)
Eosinophils Absolute: 0.1 10*3/uL (ref 0.0–0.7)
HCT: 30.8 % — ABNORMAL LOW (ref 36.0–46.0)
Hemoglobin: 9.2 g/dL — ABNORMAL LOW (ref 12.0–15.0)
LYMPHS ABS: 0.9 10*3/uL (ref 0.7–4.0)
Lymphocytes Relative: 16 % (ref 12–46)
MCH: 20.9 pg — ABNORMAL LOW (ref 26.0–34.0)
MCHC: 29.9 g/dL — ABNORMAL LOW (ref 30.0–36.0)
MCV: 69.8 fL — AB (ref 78.0–100.0)
MONOS PCT: 6 % (ref 3–12)
Monocytes Absolute: 0.3 10*3/uL (ref 0.1–1.0)
NEUTROS ABS: 4.2 10*3/uL (ref 1.7–7.7)
Neutrophils Relative %: 76 % (ref 43–77)
Platelets: 274 10*3/uL (ref 150–400)
RBC: 4.41 MIL/uL (ref 3.87–5.11)
RDW: 25.4 % — AB (ref 11.5–15.5)
WBC: 5.5 10*3/uL (ref 4.0–10.5)

## 2013-12-11 LAB — COMPREHENSIVE METABOLIC PANEL
ALT: 17 U/L (ref 0–35)
AST: 24 U/L (ref 0–37)
Albumin: 3.3 g/dL — ABNORMAL LOW (ref 3.5–5.2)
Alkaline Phosphatase: 167 U/L — ABNORMAL HIGH (ref 39–117)
BUN: 15 mg/dL (ref 6–23)
CHLORIDE: 101 meq/L (ref 96–112)
CO2: 28 meq/L (ref 19–32)
Calcium: 9.3 mg/dL (ref 8.4–10.5)
Creatinine, Ser: 0.87 mg/dL (ref 0.50–1.10)
GFR calc Af Amer: 82 mL/min — ABNORMAL LOW (ref >90)
GFR, EST NON AFRICAN AMERICAN: 71 mL/min — AB (ref >90)
GLUCOSE: 122 mg/dL — AB (ref 70–99)
Potassium: 4.3 mEq/L (ref 3.7–5.3)
Sodium: 141 mEq/L (ref 137–147)
Total Protein: 6.8 g/dL (ref 6.0–8.3)

## 2013-12-11 LAB — I-STAT CG4 LACTIC ACID, ED: Lactic Acid, Venous: 1.03 mmol/L (ref 0.5–2.2)

## 2013-12-11 LAB — URINE MICROSCOPIC-ADD ON

## 2013-12-11 LAB — TROPONIN I: Troponin I: <0.3 ng/mL (ref <0.30)

## 2013-12-11 LAB — LIPASE, BLOOD: LIPASE: 22 U/L (ref 11–59)

## 2013-12-11 MED ORDER — ONDANSETRON HCL 4 MG PO TABS: 4.0000 mg | ORAL_TABLET | Freq: Four times a day (QID) | ORAL | Status: DC

## 2013-12-11 MED ORDER — HYDROMORPHONE HCL PF 1 MG/ML IJ SOLN
1.0000 mg | Freq: Once | INTRAMUSCULAR | Status: AC
  Administered 2013-12-11: 1 mg via INTRAVENOUS
  Filled 2013-12-11: qty 1

## 2013-12-11 MED ORDER — HYDROMORPHONE HCL PF 2 MG/ML IJ SOLN
2.0000 mg | Freq: Once | INTRAMUSCULAR | Status: AC
  Administered 2013-12-11: 2 mg via INTRAVENOUS
  Filled 2013-12-11: qty 1

## 2013-12-11 NOTE — ED Notes (Signed)
Pt alert, arrives from home, c/o abd pain, distention, onset last pm, denies changes in bowel or bladder, resp even unlabored, skin pwd

## 2013-12-11 NOTE — ED Notes (Addendum)
Troponin and Lactic Acid drawn by Marylynn Pearson NT.

## 2013-12-11 NOTE — Telephone Encounter (Signed)
Discussed with ER doctor by telephone.  I reviewed CT scan.  Patient has umbilical hernia containing loop of small bowel.  No obstruction.  No edema.  No sign of strangulation.  ER plans to discharge patient home with follow up by primary MD tomorrow.  I offered evaluation in Urgent Office clinic at [place] tomorrow afternoon if symptoms persist and primary MD desires surgical assessment.  Will likely need umbilical hernia repaired at some point.  Earnstine Regal, MD, [facility] Office: [phone]

## 2013-12-11 NOTE — ED Notes (Signed)
Pt drinking coke and eating crackers.

## 2013-12-11 NOTE — Discharge Instructions (Signed)
Hernia A hernia happens when an organ inside your body pushes out through a weak spot in your belly (abdominal) wall. Most hernias get worse over time. They can often be pushed back into place (reduced). Surgery may be needed to repair hernias that cannot be pushed into place. HOME CARE  Keep doing normal activities.  Avoid lifting more than 10 pounds (4.5 kilograms).  Cough gently and avoid straining. Over time, these things will:  Increase your hernia size.  Irritate your hernia.  Break down hernia repairs.  Stop smoking.  Do not wear anything tight over your hernia. Do not keep the hernia in with an outside bandage.  Eat food that is high in fiber (fruit, vegetables, whole grains).  Drink enough fluids to keep your pee (urine) clear or pale yellow.  Take medicines to make your poop soft (stool softeners) if you cannot poop (constipated). GET HELP RIGHT AWAY IF:   You have a fever.  You have belly pain that gets worse.  You feel sick to your stomach (nauseous) and throw up (vomit).  Your skin starts to bulge out.  Your hernia turns a different color, feels hard, or is tender.  You have increased pain or puffiness (swelling) around the hernia.  You poop more or less often.  Your poop does not look the way normally does.  You have watery poop (diarrhea).  You cannot push the hernia back in place by applying gentle pressure while lying down. MAKE SURE YOU:   Understand these instructions.  Will watch your condition.  Will get help right away if you are not doing well or get worse. Document Released: 02/22/2010 Document Revised: 11/27/2011 Document Reviewed: 02/22/2010 [facility] Patient Information 2014 [place].

## 2013-12-11 NOTE — ED Notes (Addendum)
Pt had iron infusion 2 days ago. Pt also sts she has not been urinating as much as usual.

## 2013-12-12 NOTE — ED Provider Notes (Signed)
CSN: [id number]     Arrival date & time 12/11/13  1359 History   First MD Initiated Contact with Patient 12/11/13 1704     Chief Complaint  Patient presents with  . Abdominal Pain     (Consider location/radiation/quality/duration/timing/severity/associated sxs/prior Treatment) HPI  This is a 63 year old female with a history of breast cancer, COPD, heart failure who presents with abdominal pain.  Patient reports diffuse abdominal pain. Onset of symptoms last night. She also feels her abdomen is distended. She reports the abdominal pain is constant and nonradiating. It is currently 10 out of 10. She takes Dilaudid at home for her back but states that "that is not helping." She did receive fluids and iron yesterday during a scheduled treatment. She feels that she has more swelling on her legs. She has a history of lymphedema. She endorses nonbilious, nonbloody bloody emesis last night but has not had any emesis today. She states she had a normal bowel movement earlier today. Surgical history notable for multiple C-sections.  Past Medical History  Diagnosis Date  . Iron deficiency anemia   . Status post chemotherapy     4 cycles of Taxotere and cytoxan  . Chronic back pain   . Hx of pulmonary embolus     During c-Section  . Neuropathy   . Obesity     Class 2  . COPD (chronic obstructive pulmonary disease)   . S/P radiation therapy  03/04/2013-04/17/2013    1) Right breast / 50 Gy in 25 fractions/ 2) Right breast boost / 10 Gy in 5 fractions  . Diastolic heart failure   . Hx of radiation therapy 03/04/13- 04/17/13    right breast 50 Gy 25 fractions, right breast boost 10 Gy 5 fractions  . 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 Cesarean section    . Right breast lumpectomy     Family History  Problem Relation Age of Onset  .  Parkinson's disease Mother    History  Substance Use Topics  . Smoking status: Never Smoker   . Smokeless tobacco: Never Used  . Alcohol Use: No   OB History   Grav Para Term Preterm Abortions TAB SAB Ect Mult Living                 Review of Systems  Constitutional: Negative for fever.  Respiratory: Positive for shortness of breath. Negative for cough and chest tightness.   Cardiovascular: Positive for leg swelling. Negative for chest pain.  Gastrointestinal: Positive for nausea, vomiting and abdominal pain. Negative for diarrhea and constipation.  Genitourinary: Negative for dysuria and hematuria.  Musculoskeletal: Negative for back pain.  Neurological: Negative for headaches.  All other systems reviewed and are negative.      Allergies  Contrast media; Albuterol; and Prednisone  Home Medications   Current Outpatient Rx  Name  Route  Sig  Dispense  Refill  . Albuterol Sulfate (PROAIR HFA IN)   Inhalation   Inhale 2 Inhalers into the lungs every 4 (four) hours as needed (shortness of breath or wheezing.).          Marland Kitchen anastrozole (ARIMIDEX) 1 MG tablet   Oral   Take 1 mg by mouth daily.         . budesonide (PULMICORT) 180 MCG/ACT inhaler   Inhalation  Inhale 2 puffs into the lungs 2 (two) times daily.   1 Inhaler   6   . escitalopram (LEXAPRO) 10 MG tablet   Oral   Take 1 tablet (10 mg total) by mouth daily.   30 tablet   1   . ferrous sulfate (FERROUSUL) 325 (65 FE) MG tablet   Oral   Take 1 tablet (325 mg total) by mouth 3 (three) times daily with meals.   90 tablet   3   . furosemide (LASIX) 80 MG tablet   Oral   Take 80 mg by mouth 3 (three) times daily.          Marland Kitchen HYDROmorphone (DILAUDID) 4 MG tablet   Oral   Take 4 mg by mouth every 6 (six) hours as needed (pain).          Marland Kitchen HYDROmorphone HCl (EXALGO) 12 MG T24A SR tablet   Oral   Take 12 mg by mouth 2 (two) times daily.         Marland Kitchen levalbuterol (XOPENEX) 0.63 MG/3ML nebulizer  solution   Nebulization   Take 3 mLs (0.63 mg total) by nebulization every 4 (four) hours as needed for wheezing or shortness of breath.   3 mL   12   . lidocaine (LIDODERM) 5 %   Transdermal   Place 1-3 patches onto the skin daily. Remove & Discard patch within 12 hours or as directed by MD         . lidocaine-prilocaine (EMLA) cream   Topical   Apply 1 application topically as needed (port access).          . LORazepam (ATIVAN) 1 MG tablet   Oral   Take 1 mg by mouth every 8 (eight) hours.         Marland Kitchen lubiprostone (AMITIZA) 24 MCG capsule   Oral   Take 1 capsule (24 mcg total) by mouth 2 (two) times daily with a meal.   60 capsule   2   . Multiple Vitamins-Minerals (HAIR/SKIN/NAILS PO)   Oral   Take 1 tablet by mouth every morning.         . pantoprazole (PROTONIX) 40 MG tablet   Oral   Take 1 tablet (40 mg total) by mouth daily.   30 tablet   0   . potassium chloride SA (K-DUR,KLOR-CON) 20 MEQ tablet   Oral   Take 20-40 mEq by mouth 3 (three) times daily.         Marland Kitchen senna (SENOKOT) 8.6 MG TABS tablet   Oral   Take 1 tablet (8.6 mg total) by mouth 2 (two) times daily.   120 each   0   . ondansetron (ZOFRAN) 4 MG tablet   Oral   Take 1 tablet (4 mg total) by mouth every 6 (six) hours.   12 tablet   0    BP 141/67  Pulse 62  Temp(Src) 98.1 F (36.7 C) (Oral)  Resp 16  Wt 235 lb (106.595 kg)  SpO2 94%  LMP 01/30/2005 Physical Exam  Nursing note and vitals reviewed. Constitutional: She is oriented to person, place, and time. She appears well-developed and well-nourished. No distress.  overweight  HENT:  Head: Normocephalic and atraumatic.  Mouth/Throat: Oropharynx is clear and moist.  Eyes: Pupils are equal, round, and reactive to light.  Neck: Neck supple.  Cardiovascular: Normal rate, regular rhythm and normal heart sounds.   No murmur heard. Pulmonary/Chest: Effort normal and breath sounds normal. No respiratory  distress. She has no  wheezes.  Abdominal: Soft. Bowel sounds are normal. She exhibits no distension. There is tenderness. There is no rebound and no guarding.  Obese abdomen, Tenderness to palpation of the periumbilical region  Musculoskeletal: She exhibits edema.  3+ bilateral lower extremity edema  Neurological: She is alert and oriented to person, place, and time.  Skin: Skin is warm and dry.  Psychiatric: She has a normal mood and affect.    ED Course  Procedures (including critical care time) Labs Review Labs Reviewed  CBC WITH DIFFERENTIAL - Abnormal; Notable for the following:    Hemoglobin 9.2 (*)    HCT 30.8 (*)    MCV 69.8 (*)    MCH 20.9 (*)    MCHC 29.9 (*)    RDW 25.4 (*)    All other components within normal limits  COMPREHENSIVE METABOLIC PANEL - Abnormal; Notable for the following:    Glucose, Bld 122 (*)    Albumin 3.3 (*)    Alkaline Phosphatase 167 (*)    Total Bilirubin <0.2 (*)    GFR calc non Af Amer 71 (*)    GFR calc Af Amer 82 (*)    All other components within normal limits  URINALYSIS, ROUTINE W REFLEX MICROSCOPIC - Abnormal; Notable for the following:    Leukocytes, UA TRACE (*)    All other components within normal limits  LIPASE, BLOOD  TROPONIN I  URINE MICROSCOPIC-ADD ON  I-STAT CG4 LACTIC ACID, ED   Imaging Review Ct Abdomen Pelvis Wo Contrast  12/11/2013   CLINICAL DATA:  Pain.  EXAM: CT ABDOMEN AND PELVIS WITHOUT CONTRAST  TECHNIQUE: Multidetector CT imaging of the abdomen and pelvis was performed following the standard protocol without intravenous contrast.  COMPARISON:  DG ABD ACUTE W/CHEST dated 12/11/2013  FINDINGS: Liver normal. Spleen normal. Pancreas normal. No biliary distention. Gallbladder is mildly prominent. No gallstones noted. No gallbladder wall thickening or pericholecystic fluid collections. Adrenals normal. No hydronephrosis or focal significant renal lesion. Nonobstructive right nephrolithiasis versus vascular calcification noted. The bladder is  nondistended. Hysterectomy. No free pelvic fluid. No definite adnexal mass.  Shotty inguinal lymph nodes. Shotty retroperitoneal lymph nodes. Abdominal aorta normal caliber. Atherosclerotic vascular calcification abdominal aorta.  Appendectomy. No inflammatory change in the right or left lower quadrant. Large amount of stool noted throughout the colon. No bowel distention. No free air. There is no umbilical hernia with herniation of what appears to be small bowel. There is no proximal bowel dilatation again noted. The herniated small bowel wall does not appear thickened. If developing strangulation is clinically suspected a follow-up CT can be obtained. Mild abdominal wall edema noted diffusely suggesting mild anasarca.  Bibasilar atelectasis and/or infiltrates particularly in the right lung base. Heart size normal. Coronary artery disease. Surgical clips right breast. No acute bony abnormality. Degenerative changes lumbar spinal scoliosis concave left.  IMPRESSION: 1. Umbilical hernia with herniated loop of small bowel. No definite evidence of small bowel obstruction or strangulation. If symptoms persist a follow-up by CT can be obtained to evaluate for worsening. 2. Mild anasarca. 3. Bibasilar atelectasis and/or infiltrates particularly in the right lung base. 4. Coronary artery disease.   Electronically Signed   By: Marcello Moores  Register   On: 12/11/2013 22:07   Dg Abd Acute W/chest  12/11/2013   CLINICAL DATA:  Diffuse abdominal pain and nausea.  EXAM: ACUTE ABDOMEN SERIES (ABDOMEN 2 VIEW & CHEST 1 VIEW)  COMPARISON:  Chest radiograph on 11/19/2013  FINDINGS: Heart size  remains normal. Small to moderate hiatal hernia again seen. Mild bibasilar atelectasis or scarring noted. No evidence of pulmonary consolidation or edema. Chronic left central catheter fragment is again seen coursing through the left subclavian vein with tip in SVC.  No evidence of dilated bowel loops. Few scattered air-fluid levels are seen in  central and right abdominal bowel loops. No evidence free air. No radiopaque calculi identified.  IMPRESSION: Nonobstructive bowel gas pattern.  Stable small to moderate hiatal hernia and bibasilar atelectasis versus scarring.  Stable chronic left chest catheter fragment again noted.   Electronically Signed   By: Earle Gell M.D.   On: 12/11/2013 18:39   Mm Digital Diagnostic Bilat  12/10/2013   CLINICAL DATA:  Status post right lumpectomy, radiation therapy and chemotherapy for breast cancer last year. Skin flaking on the left nipple.  EXAM: DIGITAL DIAGNOSTIC  BILATERAL MAMMOGRAM WITH CAD  COMPARISON:  None.  ACR Breast Density Category c: The breast tissue is heterogeneously dense, which may obscure small masses.  FINDINGS: Post lumpectomy and postradiation changes on the right. No findings suspicious for malignancy in either breast.  On physical examination, the patient has mild skin dryness involving the left nipple with no redness or palpable mass.  Mammographic images were processed with CAD.  IMPRESSION: No evidence of malignancy.  RECOMMENDATION: Bilateral diagnostic mammogram in 1 year.  I have discussed the findings and recommendations with the patient. Results were also provided in writing at the conclusion of the visit. If applicable, a reminder letter will be sent to the patient regarding the next appointment.  BI-RADS CATEGORY  2: Benign finding(s).   Electronically Signed   By: Enrique Sack M.D.   On: 12/10/2013 13:52     EKG Interpretation None      MDM   Final diagnoses:  Abdominal pain  Umbilical hernia    Patient presents with abdominal pain. She is nontoxic on exam. Vital signs are reassuring. She does have tenderness to palpation periumbilically. Exam is somewhat limited secondary to body habitus. Patient was given pain medication and nausea medication. She does have risk factors for obstruction. However, she did have a bowel movement this morning.   Lab work is largely  reassuring. Hemoglobin is at baseline. Lactate is normal. Acute abdominal series is unrevealing. Patient has had persistent pain and is requiring multiple pain medications. CT of the abdomen and pelvis was ordered. CT shows umbilical hernia with herniated loop of bowel. There is no obvious evidence for small bowel structure strangulation. On recheck, patient is tender periumbilically.  Hernia is reducible but does pop back out. Discuss with Dr. Harlow Asa.  He reviewed the CT scan. He sees no evidence of bowel edema or strangulation. States the patient likely needs this repaired as an outpatient. Patient is able to tolerate by mouth and discharged he encouraged followup exam by PCP. Patient was also given surgery clinic for repeat exam tomorrow if pain is not improved. Discussed the patient. She is comfortable with plan. She was able to tolerate by mouth.  After history, exam, and medical workup I feel the patient has been appropriately medically screened and is safe for discharge home. Pertinent diagnoses were discussed with the patient. Patient was given return precautions.     Merryl Hacker, MD 12/12/13 [phone]

## 2013-12-16 ENCOUNTER — Telehealth (INDEPENDENT_AMBULATORY_CARE_PROVIDER_SITE_OTHER): Payer: Self-pay

## 2013-12-16 ENCOUNTER — Telehealth: Payer: Self-pay | Admitting: Internal Medicine

## 2013-12-16 DIAGNOSIS — K429 Umbilical hernia without obstruction or gangrene: Secondary | ICD-10-CM

## 2013-12-16 NOTE — Telephone Encounter (Signed)
Patient calling into office to see if Dr. Harlow [name] has any earlier appointment date & time for evaluation of hernia.  Patient offered appointment on 12/23/13 but, patient decided to keep original appointment for 01/05/14.

## 2013-12-16 NOTE — Telephone Encounter (Signed)
Pt states she needs post hos fup. Pt went thurs night and has an umbilical hernia w/ bowel in it. Pt advised to fu w/ surgeon and her pcp asap. No appt. pls advised Pt states she is in constant pain and would like to know if this could lead to an infection if pt doesn't sch operation asap. Pt want to know should she just go directly to the surgeon.  Dr Harlow Asa is who pt was referred to

## 2013-12-16 NOTE — Telephone Encounter (Signed)
Spoke to Alexandria [name] told her urgent referral done for Alexandria [name] to follow up with General surgeon Dr. Harlow Asa and someone will contact you regarding an appt. Does not need to follow up with Dr. Raliegh Ip. Alexandria [name] verbalized understanding.

## 2013-12-16 NOTE — Telephone Encounter (Signed)
Discussed with Dr. Raliegh Ip, said pt does not need to see him just follow up with General Surgeon. Order done for General Surgeon Urgent.

## 2013-12-18 ENCOUNTER — Telehealth: Payer: Self-pay | Admitting: Internal Medicine

## 2013-12-18 MED ORDER — LORAZEPAM 0.5 MG PO TABS: 0.5000 mg | ORAL_TABLET | Freq: Two times a day (BID) | ORAL | Status: DC | PRN

## 2013-12-18 NOTE — Telephone Encounter (Signed)
Patient Information:  Caller Name: Chane  Phone: [phone]  Patient: Alexandria [name]  Gender: Female  DOB: 1950/12/20  Age: 63 Years  PCP: Bluford Kaufmann (Family Practice > 74yrs old)  Office Follow Up:  Does the office need to follow up with this patient?: Yes  Instructions For The Office: Please f/u with pt concerning Rx for panic attacks.   Symptoms  Reason For Call & Symptoms: Pt reports she is having panic attacks again.  Pt asking if provider would call Rx for Ativan.  Pharmacy is [facility].  Reviewed Health History In EMR: Yes  Reviewed Medications In EMR: Yes  Reviewed Allergies In EMR: Yes  Reviewed Surgeries / Procedures: Yes  Date of Onset of Symptoms: 12/15/2013  Guideline(s) Used:  No Protocol Available - Information Only  Disposition Per Guideline:   Discuss with PCP and Callback by Nurse Today  Reason For Disposition Reached:   Nursing judgment  Advice Given:  Call Back If:  New symptoms develop  You become worse.  Patient Will Follow Care Advice:  YES

## 2013-12-18 NOTE — Telephone Encounter (Signed)
Okay for refill, lorazepam 0.5, #60 one twice a day when necessary  Suggest return office visit in 2-3 weeks

## 2013-12-18 NOTE — Telephone Encounter (Signed)
Rx called into pharmacy.  Left message on machine for patient.

## 2013-12-22 ENCOUNTER — Other Ambulatory Visit: Payer: Self-pay | Admitting: Radiology

## 2013-12-23 ENCOUNTER — Telehealth: Payer: Self-pay | Admitting: *Deleted

## 2013-12-23 ENCOUNTER — Telehealth: Payer: Self-pay | Admitting: Internal Medicine

## 2013-12-23 DIAGNOSIS — R600 Localized edema: Secondary | ICD-10-CM

## 2013-12-23 DIAGNOSIS — G8929 Other chronic pain: Secondary | ICD-10-CM

## 2013-12-23 DIAGNOSIS — M549 Dorsalgia, unspecified: Principal | ICD-10-CM

## 2013-12-23 NOTE — Telephone Encounter (Signed)
ok 

## 2013-12-23 NOTE — Telephone Encounter (Signed)
Pt is req a referral to [facility] out patient physical therapy /// phone number ; [phone]

## 2013-12-23 NOTE — Telephone Encounter (Signed)
Pt notified order for referral to PT sent and someone will contact her.

## 2013-12-24 NOTE — Telephone Encounter (Signed)
Pt cancelled her new pt appt. She has found a PCP that could see her sooner

## 2013-12-25 ENCOUNTER — Ambulatory Visit (HOSPITAL_COMMUNITY): Admission: RE | Admit: 2013-12-25 | Payer: Managed Care, Other (non HMO) | Source: Ambulatory Visit

## 2013-12-25 ENCOUNTER — Inpatient Hospital Stay (HOSPITAL_COMMUNITY): Admission: RE | Admit: 2013-12-25 | Payer: Self-pay | Source: Ambulatory Visit

## 2013-12-29 ENCOUNTER — Ambulatory Visit: Payer: Self-pay | Admitting: Family Medicine

## 2013-12-30 ENCOUNTER — Other Ambulatory Visit: Payer: Self-pay

## 2013-12-31 DIAGNOSIS — IMO0001 Reserved for inherently not codable concepts without codable children: Secondary | ICD-10-CM | POA: Diagnosis not present

## 2013-12-31 DIAGNOSIS — G894 Chronic pain syndrome: Secondary | ICD-10-CM | POA: Diagnosis not present

## 2013-12-31 DIAGNOSIS — Z79899 Other long term (current) drug therapy: Secondary | ICD-10-CM | POA: Diagnosis not present

## 2013-12-31 DIAGNOSIS — M5137 Other intervertebral disc degeneration, lumbosacral region: Secondary | ICD-10-CM | POA: Diagnosis not present

## 2014-01-01 ENCOUNTER — Ambulatory Visit (HOSPITAL_BASED_OUTPATIENT_CLINIC_OR_DEPARTMENT_OTHER): Payer: Managed Care, Other (non HMO)

## 2014-01-01 ENCOUNTER — Other Ambulatory Visit: Payer: Managed Care, Other (non HMO)

## 2014-01-01 ENCOUNTER — Telehealth: Payer: Self-pay | Admitting: Hematology and Oncology

## 2014-01-01 ENCOUNTER — Other Ambulatory Visit: Payer: Self-pay | Admitting: Hematology and Oncology

## 2014-01-01 ENCOUNTER — Ambulatory Visit (HOSPITAL_BASED_OUTPATIENT_CLINIC_OR_DEPARTMENT_OTHER): Payer: Managed Care, Other (non HMO) | Admitting: Hematology and Oncology

## 2014-01-01 ENCOUNTER — Telehealth: Payer: Self-pay | Admitting: *Deleted

## 2014-01-01 VITALS — BP 158/79 | HR 81 | Temp 97.8°F | Resp 18

## 2014-01-01 DIAGNOSIS — C50419 Malignant neoplasm of upper-outer quadrant of unspecified female breast: Secondary | ICD-10-CM

## 2014-01-01 DIAGNOSIS — R609 Edema, unspecified: Secondary | ICD-10-CM

## 2014-01-01 DIAGNOSIS — R109 Unspecified abdominal pain: Secondary | ICD-10-CM

## 2014-01-01 DIAGNOSIS — R911 Solitary pulmonary nodule: Secondary | ICD-10-CM

## 2014-01-01 DIAGNOSIS — R600 Localized edema: Secondary | ICD-10-CM

## 2014-01-01 DIAGNOSIS — R635 Abnormal weight gain: Secondary | ICD-10-CM

## 2014-01-01 DIAGNOSIS — K436 Other and unspecified ventral hernia with obstruction, without gangrene: Secondary | ICD-10-CM

## 2014-01-01 DIAGNOSIS — D649 Anemia, unspecified: Secondary | ICD-10-CM

## 2014-01-01 DIAGNOSIS — R5383 Other fatigue: Secondary | ICD-10-CM

## 2014-01-01 DIAGNOSIS — K56609 Unspecified intestinal obstruction, unspecified as to partial versus complete obstruction: Secondary | ICD-10-CM

## 2014-01-01 DIAGNOSIS — M2550 Pain in unspecified joint: Secondary | ICD-10-CM

## 2014-01-01 LAB — COMPREHENSIVE METABOLIC PANEL (CC13)
ALT: 29 U/L (ref 0–55)
ANION GAP: 13 meq/L — AB (ref 3–11)
AST: 32 U/L (ref 5–34)
Albumin: 3.7 g/dL (ref 3.5–5.0)
Alkaline Phosphatase: 155 U/L — ABNORMAL HIGH (ref 40–150)
BUN: 17.3 mg/dL (ref 7.0–26.0)
CO2: 30 mEq/L — ABNORMAL HIGH (ref 22–29)
CREATININE: 1 mg/dL (ref 0.6–1.1)
Calcium: 9.8 mg/dL (ref 8.4–10.4)
Chloride: 99 mEq/L (ref 98–109)
Glucose: 132 mg/dl (ref 70–140)
Potassium: 3.2 mEq/L — ABNORMAL LOW (ref 3.5–5.1)
Sodium: 142 mEq/L (ref 136–145)
Total Bilirubin: 0.25 mg/dL (ref 0.20–1.20)
Total Protein: 7.7 g/dL (ref 6.4–8.3)

## 2014-01-01 LAB — IRON AND TIBC CHCC
%SAT: 19 % — ABNORMAL LOW (ref 21–57)
IRON: 58 ug/dL (ref 41–142)
TIBC: 311 ug/dL (ref 236–444)
UIBC: 253 ug/dL (ref 120–384)

## 2014-01-01 LAB — CBC & DIFF AND RETIC
BASO%: 0.2 % (ref 0.0–2.0)
Basophils Absolute: 0 10*3/uL (ref 0.0–0.1)
EOS%: 2.6 % (ref 0.0–7.0)
Eosinophils Absolute: 0.1 10*3/uL (ref 0.0–0.5)
HCT: 39.6 % (ref 34.8–46.6)
HGB: 12.3 g/dL (ref 11.6–15.9)
IMMATURE RETIC FRACT: 7.8 % (ref 1.60–10.00)
LYMPH#: 1.1 10*3/uL (ref 0.9–3.3)
LYMPH%: 20.5 % (ref 14.0–49.7)
MCH: 24.5 pg — AB (ref 25.1–34.0)
MCHC: 31.1 g/dL — ABNORMAL LOW (ref 31.5–36.0)
MCV: 78.9 fL — ABNORMAL LOW (ref 79.5–101.0)
MONO#: 0.5 10*3/uL (ref 0.1–0.9)
MONO%: 9.6 % (ref 0.0–14.0)
NEUT#: 3.6 10*3/uL (ref 1.5–6.5)
NEUT%: 67.1 % (ref 38.4–76.8)
Platelets: 214 10*3/uL (ref 145–400)
RBC: 5.02 10*6/uL (ref 3.70–5.45)
RDW: 31.7 % — AB (ref 11.2–14.5)
RETIC CT ABS: 58.73 10*3/uL (ref 33.70–90.70)
Retic %: 1.17 % (ref 0.70–2.10)
WBC: 5.4 10*3/uL (ref 3.9–10.3)

## 2014-01-01 LAB — LACTATE DEHYDROGENASE (CC13): LDH: 249 U/L — ABNORMAL HIGH (ref 125–245)

## 2014-01-01 LAB — MORPHOLOGY: PLT EST: ADEQUATE

## 2014-01-01 LAB — HOLD TUBE, BLOOD BANK

## 2014-01-01 LAB — FERRITIN CHCC: Ferritin: 127 ng/ml (ref 9–269)

## 2014-01-01 LAB — TSH CHCC: TSH: 2.715 m(IU)/L (ref 0.308–3.960)

## 2014-01-01 NOTE — Telephone Encounter (Signed)
Double up on that now and I will write her a new script in her next visit

## 2014-01-01 NOTE — Telephone Encounter (Signed)
Instructed pt she can increase her Lexapro to 20 mg by taking two of her 10 mg tablets daily. Dr. Alvy Bimler will see her on 5/01 and can write her a new rx on that visit.   Pt verbalized understanding.  She also wanted to know what her blood type is?  Informed that it is documented in lab as O positive.  She verbalized understanding.

## 2014-01-01 NOTE — Telephone Encounter (Signed)
gv adn printed appt sched and avs for pt for May......sent pt to lab °

## 2014-01-01 NOTE — Progress Notes (Signed)
[place] CONSULT NOTE  Patient Care Team: Marletta Lor, MD as PCP - General (Internal Medicine)  CHIEF COMPLAINTS/PURPOSE OF CONSULTATION:  Chronic anemia with component of iron deficiency  HISTORY OF PRESENTING ILLNESS:  Alexandria [name] 63 y.o. female is here because of chronic anemia of unknown etiology. The patient is currently receiving adjuvant Arimidex for recent diagnosis of resected right breast cancer. She was also noted to be anemic requiring transfusions.  She was found to have abnormal CBC from routine blood work as part of her treatment related to diagnosis of breast cancer. From June 2014 to present, she was noted to be progressively anemic to the point requiring blood transfusion in February 2015.  The patient had undergone extensive evaluation in [place] prior to moving to [place]. Unfortunately I do not have records of those. According to her, she had EGD and colonoscopy and had received multiple units of blood transfusion last year. According to the patient, the hemoglobin dropped to 3 g at some point in time. She denies recent chest pain on exertion, shortness of breath on minimal exertion, but did complain of profound fatigue. She denies pre-syncopal episodes, or palpitations. She had not noticed any recent bleeding such as epistaxis, hematuria or hematochezia. She had recent melena. The patient denies over the counter NSAID ingestion. She is not on antiplatelets agents. Her last colonoscopy was in 2014.  She denies any pica and eats a variety of diet. She donated blood many years ago. She had received blood transfusion twice. With the most recent blood transfusion, according to the patient, she had a transfusion reaction. According to the blood typing on 11/19/2013, warm antibody was detected. The patient was prescribed oral iron supplements and she takes it 3 times a day with an empty stomach. She received 1 dose of intravenous iron 3 weeks  ago. She also complained of diffuse edema and has gained over 100 pounds of weight.  In terms of her breast cancer history, it is summarized as follows. Malignant neoplasm of upper-outer quadrant of female breast   Primary site: Breast (Right)   Staging method: AJCC 7th Edition   Pathologic: Stage IA (T1, N0, cM0) signed by Deatra Robinson, MD on 03/26/2013  8:21 AM   Summary: Stage IA (T1, N0, cM0)  In January 2014 she had a mammogram performed that showed an abnormality in the right breast. This was in [place]. 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 Patient went on to have a right lumpectomy performed revealing a T1cN0 disease stage at diagnosis stage I. She subsequently had Oncotype DX performed that showed a recurrence score of 23 giving her a 10 year risk for recurrence but 10-30% in the intermediate risk category. Because of this she received adjuvant chemotherapy consisting of 4 cycles of Taxotere and Cytoxan. She tolerated it well. She completed adjuvant radiation therapy from 03/04/2013 through 04/17/2013. She started taking Arimidex's from 05/16/2013.  MEDICAL HISTORY:  Past Medical History  Diagnosis Date  . Iron deficiency anemia   . Status post chemotherapy     4 cycles of Taxotere and cytoxan  . Chronic back pain   . Hx of pulmonary embolus     During c-Section  . Neuropathy   . Obesity     Class 2  . COPD (chronic obstructive pulmonary disease)   . S/P radiation therapy  03/04/2013-04/17/2013    1) Right breast / 50 Gy in 25 fractions/ 2) Right breast boost / 10 Gy in 5 fractions  . Diastolic heart failure   . Hx of radiation therapy 03/04/13- 04/17/13    right breast 50 Gy 25 fractions, right breast boost 10 Gy 5 fractions  . Breast cancer 10/02/12    Invasive Ductal Carcinoma of the Right Upper Outer Quadrant -  ER (>90%), PR - Neg., Her2 Neu Negative, Ki-67 Unknown    SURGICAL HISTORY: Past Surgical History  Procedure Laterality Date  . Cesarean section    . Right breast lumpectomy      SOCIAL HISTORY: History   Social History  . Marital Status: Married    Spouse Name: N/A    Number of Children: N/A  . Years of Education: N/A   Occupational History  . Not on file.   Social History Main Topics  . Smoking status: Never Smoker   . Smokeless tobacco: Never Used  . Alcohol Use: No  . Drug Use: No  . Sexual Activity: Yes   Other Topics Concern  . Not on file   Social History Narrative  . No narrative on file    FAMILY HISTORY: Family History  Problem Relation Age of Onset  . Parkinson's disease Mother     ALLERGIES:  is allergic to contrast media; albuterol; and prednisone.  MEDICATIONS:  Current Outpatient Prescriptions  Medication Sig Dispense Refill  . Albuterol Sulfate (PROAIR HFA IN) Inhale 2 Inhalers into the lungs every 4 (four) hours as needed (shortness of breath or wheezing.).       Marland Kitchen anastrozole (ARIMIDEX) 1 MG tablet Take 1 mg by mouth daily.      . budesonide (PULMICORT) 180 MCG/ACT inhaler Inhale 2 puffs into the lungs 2 (two) times daily.  1 Inhaler  6  . escitalopram (LEXAPRO) 10 MG tablet Take 1 tablet (10 mg total) by mouth daily.  30 tablet  1  . ferrous sulfate (FERROUSUL) 325 (65 FE) MG tablet Take 1 tablet (325 mg total) by mouth 3 (three) times daily with meals.  90 tablet  3  . furosemide (LASIX) 80 MG tablet Take 80 mg by mouth 2 (two) times daily. Increase to three times daily if gain more 2 lbs in a day.      Marland Kitchen HYDROmorphone (DILAUDID) 4 MG tablet Take 4 mg by mouth 3 (three) times daily as needed (pain).       Marland Kitchen HYDROmorphone HCl (EXALGO) 12 MG T24A SR tablet Take 12 mg by mouth 2 (two) times daily.      Marland Kitchen levalbuterol (XOPENEX) 0.63 MG/3ML nebulizer solution Take 3 mLs (0.63 mg total) by nebulization every 4 (four) hours as needed for wheezing or  shortness of breath.  3 mL  12  . lidocaine (LIDODERM) 5 % Place 1-3 patches onto the skin daily. Remove & Discard patch within 12 hours or as directed by MD      . lidocaine-prilocaine (EMLA) cream Apply 1 application topically as needed (port access).       . LORazepam (ATIVAN) 0.5 MG tablet Take 1 tablet (0.5 mg total) by mouth 2 (two) times daily as needed for anxiety.  60 tablet  0  . lubiprostone (AMITIZA) 24 MCG capsule Take 1 capsule (24 mcg total) by mouth 2 (two) times daily with a meal.  60 capsule  2  . Multiple Vitamins-Minerals (HAIR/SKIN/NAILS PO) Take 1 tablet by mouth every morning.      Marland Kitchen  pantoprazole (PROTONIX) 40 MG tablet Take 1 tablet (40 mg total) by mouth daily.  30 tablet  0  . potassium chloride SA (K-DUR,KLOR-CON) 20 MEQ tablet Take 20-40 mEq by mouth 3 (three) times daily.       No current facility-administered medications for this visit.    REVIEW OF SYSTEMS:   Constitutional: Denies fevers, chills or abnormal night sweats Eyes: Denies blurriness of vision, double vision or watery eyes Ears, nose, mouth, throat, and face: Denies mucositis or sore throat Respiratory: Denies cough, dyspnea or wheezes Cardiovascular: Denies palpitation, chest discomfort or lower extremity swelling Gastrointestinal:  Denies nausea, heartburn or change in bowel habits Skin: Denies abnormal skin rashes Lymphatics: Denies new lymphadenopathy or easy bruising Neurological:Denies numbness, tingling or new weaknesses Behavioral/Psych: Mood is stable, no new changes  All other systems were reviewed with the patient and are negative.  PHYSICAL EXAMINATION: ECOG PERFORMANCE STATUS: 1 - Symptomatic but completely ambulatory  Filed Vitals:   01/01/14 1258  BP: 158/79  Pulse: 81  Temp: 97.8 F (36.6 C)  Resp: 18   Filed Weights    GENERAL:alert, no distress and comfortable. She is mildly obese. SKIN: skin color, texture, turgor are normal, no rashes or significant lesions EYES:  normal, conjunctiva are pink and non-injected, sclera clear OROPHARYNX:no exudate, no erythema and lips, buccal mucosa, and tongue normal  NECK: supple, thyroid normal size, non-tender, without nodularity LYMPH:  no palpable lymphadenopathy in the cervical, axillary or inguinal LUNGS: clear to auscultation and percussion with normal breathing effort HEART: regular rate & rhythm and no murmurs with moderate bilateral lower extremity edema ABDOMEN:abdomen soft, severe tenderness in the periumbilical region with guarding. Unable to appreciate hepatosplenomegaly. Musculoskeletal:no cyanosis of digits and no clubbing  PSYCH: alert & oriented x 3 with fluent speech NEURO: no focal motor/sensory deficits Bilateral breast examination was performed. Well-healed lumpectomy scar on the right with no other palpable abnormalities. LABORATORY DATA:  I have reviewed the data as listed Lab Results  Component Value Date   WBC 5.4 01/01/2014   HGB 12.3 01/01/2014   HCT 39.6 01/01/2014   MCV 78.9* 01/01/2014   PLT 214 01/01/2014     Chemistry      Component Value Date/Time   NA 142 01/01/2014 1400   NA 141 12/11/2013 1444   K 3.2* 01/01/2014 1400   K 4.3 12/11/2013 1444   CL 101 12/11/2013 1444   CL 99 03/04/2013 1239   CO2 30* 01/01/2014 1400   CO2 28 12/11/2013 1444   BUN 17.3 01/01/2014 1400   BUN 15 12/11/2013 1444   CREATININE 1.0 01/01/2014 1400   CREATININE 0.87 12/11/2013 1444      Component Value Date/Time   CALCIUM 9.8 01/01/2014 1400   CALCIUM 9.3 12/11/2013 1444   ALKPHOS 155* 01/01/2014 1400   ALKPHOS 167* 12/11/2013 1444   AST 32 01/01/2014 1400   AST 24 12/11/2013 1444   ALT 29 01/01/2014 1400   ALT 17 12/11/2013 1444   BILITOT 0.25 01/01/2014 1400   BILITOT <0.2* 12/11/2013 1444       RADIOGRAPHIC STUDIES: I have personally reviewed the radiological images as listed and agreed with the findings in the report. Ct Abdomen Pelvis Wo Contrast  12/11/2013   CLINICAL DATA:  Pain.  EXAM: CT ABDOMEN  AND PELVIS WITHOUT CONTRAST  TECHNIQUE: Multidetector CT imaging of the abdomen and pelvis was performed following the standard protocol without intravenous contrast.  COMPARISON:  DG ABD ACUTE W/CHEST dated 12/11/2013  FINDINGS: Liver  normal. Spleen normal. Pancreas normal. No biliary distention. Gallbladder is mildly prominent. No gallstones noted. No gallbladder wall thickening or pericholecystic fluid collections. Adrenals normal. No hydronephrosis or focal significant renal lesion. Nonobstructive right nephrolithiasis versus vascular calcification noted. The bladder is nondistended. Hysterectomy. No free pelvic fluid. No definite adnexal mass.  Shotty inguinal lymph nodes. Shotty retroperitoneal lymph nodes. Abdominal aorta normal caliber. Atherosclerotic vascular calcification abdominal aorta.  Appendectomy. No inflammatory change in the right or left lower quadrant. Large amount of stool noted throughout the colon. No bowel distention. No free air. There is no umbilical hernia with herniation of what appears to be small bowel. There is no proximal bowel dilatation again noted. The herniated small bowel wall does not appear thickened. If developing strangulation is clinically suspected a follow-up CT can be obtained. Mild abdominal wall edema noted diffusely suggesting mild anasarca.  Bibasilar atelectasis and/or infiltrates particularly in the right lung base. Heart size normal. Coronary artery disease. Surgical clips right breast. No acute bony abnormality. Degenerative changes lumbar spinal scoliosis concave left.  IMPRESSION: 1. Umbilical hernia with herniated loop of small bowel. No definite evidence of small bowel obstruction or strangulation. If symptoms persist a follow-up by CT can be obtained to evaluate for worsening. 2. Mild anasarca. 3. Bibasilar atelectasis and/or infiltrates particularly in the right lung base. 4. Coronary artery disease.   Electronically Signed   By: Marcello Moores  Register   On:  12/11/2013 22:07   ASSESSMENT & PLAN:  #1 Anemia with component of iron deficiency She is responding well to intravenous iron therapy. I will request outside EGD and colonoscopy report. It is clear to me that she has been actively bleeding since last year. #2 recent strangulated hernia She is very tender on palpation. I suspect she is still having semi-obstructive symptoms. She is having an evaluation with Gen. surgery next week. I will try to order a CT scan for further evaluation #3 lung nodules I reviewed her outside CT scan from January 2014. There are abnormalities in the left upper lobe and some abnormal changes in the right lower lobe. I recommend repeat imaging study, especially in view that she had diagnosis of breast cancer. #4 T1cN0M0 right breast cancer status post lumpectomy, chemotherapy and radiation therapy I will continue Arimidex. The patient requested transfer of care to me #5 excessive weight gain and leg edema Echocardiogram from 04/06/2013 she'll present ejection fraction. I will order a TSH and free thyroxine to rule out severe hypothyroidism as a cause of her excessive weight gain and leg edema. 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 Per patient request we will try to avoid platelet transfusion if possible. I will recheck iron study and will try to keep her ferritin above 100. #7 nonspecific joint pain I will order screening test to look for monoclonal paraproteinemia.   All questions were answered. The patient knows to call the clinic with any problems, questions or concerns. I spent 55 minutes counseling the patient face to face. The total time spent in the appointment was 60 minutes and more than 50% was on counseling.     Heath Lark, MD 01/01/2014 8:03 PM

## 2014-01-01 NOTE — Telephone Encounter (Signed)
Pt left VM states Dr. Alvy Bimler suggested increasing her lexapro to 20 mg.  She asks if Dr. Alvy Bimler is going to order this increase or is pt supposed to ask her other doctor?

## 2014-01-01 NOTE — Telephone Encounter (Signed)
s.w. pt and advised on April ct appt....pt ok and aware

## 2014-01-02 ENCOUNTER — Telehealth: Payer: Self-pay | Admitting: *Deleted

## 2014-01-02 NOTE — Telephone Encounter (Signed)
Release of information faxed to [facility] at fax [phone].  EGD and Colonoscopy reports requested by Dr. Alvy Bimler.

## 2014-01-05 ENCOUNTER — Other Ambulatory Visit: Payer: Self-pay

## 2014-01-05 ENCOUNTER — Ambulatory Visit (INDEPENDENT_AMBULATORY_CARE_PROVIDER_SITE_OTHER): Payer: Medicare Other | Admitting: Surgery

## 2014-01-05 LAB — KAPPA/LAMBDA LIGHT CHAINS
KAPPA LAMBDA RATIO: 1.01 (ref 0.26–1.65)
Kappa free light chain: 3.35 mg/dL — ABNORMAL HIGH (ref 0.33–1.94)
Lambda Free Lght Chn: 3.33 mg/dL — ABNORMAL HIGH (ref 0.57–2.63)

## 2014-01-05 LAB — SPEP & IFE WITH QIG
ALPHA-2-GLOBULIN: 9.6 % (ref 7.1–11.8)
Albumin ELP: 56.1 % (ref 55.8–66.1)
Alpha-1-Globulin: 4.5 % (ref 2.9–4.9)
BETA 2: 5.9 % (ref 3.2–6.5)
BETA GLOBULIN: 6.1 % (ref 4.7–7.2)
GAMMA GLOBULIN: 17.8 % (ref 11.1–18.8)
IGA: 370 mg/dL (ref 69–380)
IGG (IMMUNOGLOBIN G), SERUM: 1220 mg/dL (ref 690–1700)
IgM, Serum: 105 mg/dL (ref 52–322)
TOTAL PROTEIN, SERUM ELECTROPHOR: 7.4 g/dL (ref 6.0–8.3)

## 2014-01-05 LAB — GLIA (IGA/G) + TTG IGA
Gliadin IgA: 9.1 U/mL (ref <20)
Gliadin IgG: 4.8 U/mL (ref <20)
Tissue Transglutaminase Ab, IgA: 11 U/mL (ref <20)

## 2014-01-05 LAB — T4, FREE: Free T4: 1.15 ng/dL (ref 0.80–1.80)

## 2014-01-05 LAB — BETA 2 MICROGLOBULIN, SERUM: BETA 2 MICROGLOBULIN: 5.87 mg/L — AB (ref <=2.51)

## 2014-01-05 LAB — SEDIMENTATION RATE: Sed Rate: 5 mm/hr (ref 0–22)

## 2014-01-06 ENCOUNTER — Encounter: Payer: Self-pay | Admitting: Internal Medicine

## 2014-01-06 ENCOUNTER — Ambulatory Visit (INDEPENDENT_AMBULATORY_CARE_PROVIDER_SITE_OTHER): Payer: Managed Care, Other (non HMO) | Admitting: Internal Medicine

## 2014-01-06 ENCOUNTER — Other Ambulatory Visit: Payer: Self-pay | Admitting: Internal Medicine

## 2014-01-06 DIAGNOSIS — R600 Localized edema: Secondary | ICD-10-CM

## 2014-01-06 DIAGNOSIS — I10 Essential (primary) hypertension: Secondary | ICD-10-CM | POA: Diagnosis not present

## 2014-01-06 DIAGNOSIS — K5900 Constipation, unspecified: Secondary | ICD-10-CM

## 2014-01-06 DIAGNOSIS — R609 Edema, unspecified: Secondary | ICD-10-CM

## 2014-01-06 DIAGNOSIS — K5909 Other constipation: Secondary | ICD-10-CM

## 2014-01-06 MED ORDER — HYDROCODONE-HOMATROPINE 5-1.5 MG/5ML PO SYRP: 5.0000 mL | ORAL_SOLUTION | Freq: Four times a day (QID) | ORAL | Status: DC | PRN

## 2014-01-06 MED ORDER — DOXYCYCLINE HYCLATE 100 MG PO TABS: 100.0000 mg | ORAL_TABLET | Freq: Two times a day (BID) | ORAL | Status: DC

## 2014-01-06 NOTE — Progress Notes (Signed)
Pre-visit discussion using our clinic review tool. No additional management support is needed unless otherwise documented below in the visit note.  

## 2014-01-06 NOTE — Patient Instructions (Signed)
Take over-the-counter expectorants and cough medications such as  Mucinex DM.  Call if there is no improvement in 5 to 7 days or if he developed worsening cough, fever, or new symptoms, such as shortness of breath or chest pain.  Limit your sodium (Salt) intake    It is important that you exercise regularly, at least 20 minutes 3 to 4 times per week.  If you develop chest pain or shortness of breath seek  medical attention.  DASH Diet The DASH diet stands for "Dietary Approaches to Stop Hypertension." It is a healthy eating plan that has been shown to reduce high blood pressure (hypertension) in as little as 14 days, while also possibly providing other significant health benefits. These other health benefits include reducing the risk of breast cancer after menopause and reducing the risk of type 2 diabetes, heart disease, colon cancer, and stroke. Health benefits also include weight loss and slowing kidney failure in patients with chronic kidney disease.  DIET GUIDELINES  Limit salt (sodium). Your diet should contain less than 1500 mg of sodium daily.  Limit refined or processed carbohydrates. Your diet should include mostly whole grains. Desserts and added sugars should be used sparingly.  Include small amounts of heart-healthy fats. These types of fats include nuts, oils, and tub margarine. Limit saturated and trans fats. These fats have been shown to be harmful in the body. CHOOSING FOODS  The following food groups are based on a 2000 calorie diet. See your Registered Dietitian for individual calorie needs. Grains and Grain Products (6 to 8 servings daily)  Eat More Often: Whole-wheat bread, brown rice, whole-grain or wheat pasta, quinoa, popcorn without added fat or salt (air popped).  Eat Less Often: White bread, white pasta, white rice, cornbread. Vegetables (4 to 5 servings daily)  Eat More Often: Fresh, frozen, and canned vegetables. Vegetables may be raw, steamed, roasted, or grilled  with a minimal amount of fat.  Eat Less Often/Avoid: Creamed or fried vegetables. Vegetables in a cheese sauce. Fruit (4 to 5 servings daily)  Eat More Often: All fresh, canned (in natural juice), or frozen fruits. Dried fruits without added sugar. One hundred percent fruit juice ( cup [237 mL] daily).  Eat Less Often: Dried fruits with added sugar. Canned fruit in light or heavy syrup. [employer], Fish, and Poultry (2 servings or less daily. One serving is 3 to 4 oz [85-114 g]).  Eat More Often: Ninety percent or leaner ground beef, tenderloin, sirloin. Round cuts of beef, chicken breast, [place] breast. All fish. Grill, bake, or broil your meat. Nothing should be fried.  Eat Less Often/Avoid: Fatty cuts of meat, [place], or chicken leg, thigh, or wing. Fried cuts of meat or fish. Dairy (2 to 3 servings)  Eat More Often: Low-fat or fat-free milk, low-fat plain or light yogurt, reduced-fat or part-skim cheese.  Eat Less Often/Avoid: Milk (whole, 2%).Whole milk yogurt. Full-fat cheeses. Nuts, Seeds, and Legumes (4 to 5 servings per week)  Eat More Often: All without added salt.  Eat Less Often/Avoid: Salted nuts and seeds, canned beans with added salt. Fats and Sweets (limited)  Eat More Often: Vegetable oils, tub margarines without trans fats, sugar-free gelatin. Mayonnaise and salad dressings.  Eat Less Often/Avoid: Coconut oils, palm oils, butter, stick margarine, cream, half and half, cookies, candy, pie. FOR MORE INFORMATION The Dash Diet Eating Plan: www.dashdiet.org Document Released: 08/24/2011 Document Revised: 11/27/2011 Document Reviewed: 08/24/2011 [facility] Patient Information 2014 [place], [place].

## 2014-01-06 NOTE — Progress Notes (Signed)
Subjective:    Patient ID: Alexandria [name], female    DOB: Jul 20, 1951, 63 y.o.   MRN: [id number]  HPI  63 year old patient who has a history of COPD and also a history of diastolic heart failure.  For the past several days.  She has had a cough, expectorating green sputum.  She has had some increased shortness of breath and some mild wheezing.  She is on maintenance and rescue bronchodilators.  She has a history of anemia and last month had a brief hospital admission.  She is being followed closely by hematology.  Recent H&H normal.  She remains on supplemental iron.  Past Medical History  Diagnosis Date  . Iron deficiency anemia   . Status post chemotherapy     4 cycles of Taxotere and cytoxan  . Chronic back pain   . Hx of pulmonary embolus     During c-Section  . Neuropathy   . Obesity     Class 2  . COPD (chronic obstructive pulmonary disease)   . S/P radiation therapy  03/04/2013-04/17/2013    1) Right breast / 50 Gy in 25 fractions/ 2) Right breast boost / 10 Gy in 5 fractions  . Diastolic heart failure   . Hx of radiation therapy 03/04/13- 04/17/13    right breast 50 Gy 25 fractions, right breast boost 10 Gy 5 fractions  . 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 Marital Status: Married    Spouse Name: N/A    Number of Children: N/A  . Years of Education: N/A   Occupational History  . Not on file.   Social History Main Topics  . Smoking status: Never Smoker   . Smokeless tobacco: Never Used  . Alcohol Use: No  . Drug Use: No  . Sexual Activity: Yes   Other Topics Concern  . Not on file   Social History Narrative  . No narrative on file    Past Surgical History  Procedure Laterality Date  . Cesarean section    . Right breast lumpectomy      Family History  Problem Relation Age of Onset  . Parkinson's disease Mother      Allergies  Allergen Reactions  . Contrast Media [Iodinated Diagnostic Agents] Anaphylaxis  . Albuterol Other (See Comments)    Agitation   . Prednisone Other (See Comments)    Pt gets very agitated when she takes high doses of steroids    Current Outpatient Prescriptions on File Prior to Visit  Medication Sig Dispense Refill  . Albuterol Sulfate (PROAIR HFA IN) Inhale 2 Inhalers into the lungs every 4 (four) hours as needed (shortness of breath or wheezing.).       Marland Kitchen anastrozole (ARIMIDEX) 1 MG tablet Take 1 mg by mouth daily.      . budesonide (PULMICORT) 180 MCG/ACT inhaler Inhale 2 puffs into the lungs 2 (two) times daily.  1 Inhaler  6  . escitalopram (LEXAPRO) 10 MG tablet Take 1 tablet (10 mg total) by mouth daily.  30 tablet  1  . ferrous sulfate (FERROUSUL) 325 (65 FE) MG tablet Take 1 tablet (325 mg total) by mouth 3 (three) times daily with meals.  90 tablet  3  . furosemide (LASIX) 80 MG tablet Take 80 mg by mouth 2 (two) times daily. Increase to three times  daily if gain more 2 lbs in a day.      Marland Kitchen HYDROmorphone (DILAUDID) 4 MG tablet Take 4 mg by mouth 3 (three) times daily as needed (pain).       Marland Kitchen HYDROmorphone HCl (EXALGO) 12 MG T24A SR tablet Take 12 mg by mouth 2 (two) times daily.      Marland Kitchen levalbuterol (XOPENEX) 0.63 MG/3ML nebulizer solution Take 3 mLs (0.63 mg total) by nebulization every 4 (four) hours as needed for wheezing or shortness of breath.  3 mL  12  . lidocaine (LIDODERM) 5 % Place 1-3 patches onto the skin daily. Remove & Discard patch within 12 hours or as directed by MD      . lidocaine-prilocaine (EMLA) cream Apply 1 application topically as needed (port access).       . LORazepam (ATIVAN) 0.5 MG tablet Take 1 tablet (0.5 mg total) by mouth 2 (two) times daily as needed for anxiety.  60 tablet  0  . lubiprostone (AMITIZA) 24 MCG capsule Take 1 capsule (24 mcg total) by mouth 2 (two) times daily with a meal.  60 capsule  2  . Multiple Vitamins-Minerals  (HAIR/SKIN/NAILS PO) Take 1 tablet by mouth every morning.      . pantoprazole (PROTONIX) 40 MG tablet Take 1 tablet (40 mg total) by mouth daily.  30 tablet  0  . potassium chloride SA (K-DUR,KLOR-CON) 20 MEQ tablet Take 20-40 mEq by mouth 3 (three) times daily.       No current facility-administered medications on file prior to visit.    BP 110/74  Pulse 94  Temp(Src) 97.7 F (36.5 C) (Oral)  Resp 22  SpO2 95%  LMP 01/30/2005       Review of Systems  Constitutional: Negative.   HENT: Negative for congestion, dental problem, hearing loss, rhinorrhea, sinus pressure, sore throat and tinnitus.   Eyes: Negative for pain, discharge and visual disturbance.  Respiratory: Positive for cough, shortness of breath and wheezing.   Cardiovascular: Negative for chest pain, palpitations and leg swelling.  Gastrointestinal: Negative for nausea, vomiting, abdominal pain, diarrhea, constipation, blood in stool and abdominal distention.  Genitourinary: Negative for dysuria, urgency, frequency, hematuria, flank pain, vaginal bleeding, vaginal discharge, difficulty urinating, vaginal pain and pelvic pain.  Musculoskeletal: Negative for arthralgias, gait problem and joint swelling.  Skin: Negative for rash.  Neurological: Negative for dizziness, syncope, speech difficulty, weakness, numbness and headaches.  Hematological: Negative for adenopathy.  Psychiatric/Behavioral: Negative for behavioral problems, dysphoric mood and agitation. The patient is not nervous/anxious.        Objective:   Physical Exam  Constitutional: She is oriented to person, place, and time. She appears well-developed and well-nourished.  HENT:  Head: Normocephalic.  Right Ear: External ear normal.  Left Ear: External ear normal.  Mouth/Throat: Oropharynx is clear and moist.  Eyes: Conjunctivae and EOM are normal. Pupils are equal, round, and reactive to light.  Neck: Normal range of motion. Neck supple. No thyromegaly  present.  Cardiovascular: Normal rate, regular rhythm, normal heart sounds and intact distal pulses.   Pulmonary/Chest: Effort normal. No respiratory distress. She has wheezes.  Few scattered rhonchi and wheezing  Abdominal: Soft. Bowel sounds are normal. She exhibits no mass. There is no tenderness.  Musculoskeletal: Normal range of motion. She exhibits edema.  Chronic lower extremity edema, left greater than right  Lymphadenopathy:    She has no cervical adenopathy.  Neurological: She is alert and oriented to person, place, and time.  Skin:  Skin is warm and dry. No rash noted.  Psychiatric: She has a normal mood and affect. Her behavior is normal.          Assessment & Plan:   Exacerbation COPD.  We'll continue inhalational medications.  We'll treat with doxycycline for 7 days Anemia stable Hypertension controlled  Recheck 6 months Followup hematology

## 2014-01-07 ENCOUNTER — Telehealth: Payer: Self-pay | Admitting: Internal Medicine

## 2014-01-07 ENCOUNTER — Other Ambulatory Visit: Payer: Self-pay

## 2014-01-07 NOTE — Telephone Encounter (Signed)
Relevant patient education assigned to patient using Emmi. ° °

## 2014-01-08 ENCOUNTER — Ambulatory Visit: Payer: Self-pay | Admitting: Internal Medicine

## 2014-01-08 ENCOUNTER — Inpatient Hospital Stay: Admission: RE | Admit: 2014-01-08 | Payer: Self-pay | Source: Ambulatory Visit

## 2014-01-08 ENCOUNTER — Ambulatory Visit: Payer: Self-pay | Admitting: Family Medicine

## 2014-01-08 ENCOUNTER — Other Ambulatory Visit: Payer: Self-pay

## 2014-01-12 ENCOUNTER — Ambulatory Visit: Payer: Managed Care, Other (non HMO) | Attending: Oncology | Admitting: Physical Therapy

## 2014-01-12 DIAGNOSIS — F411 Generalized anxiety disorder: Secondary | ICD-10-CM | POA: Insufficient documentation

## 2014-01-12 DIAGNOSIS — R609 Edema, unspecified: Secondary | ICD-10-CM | POA: Insufficient documentation

## 2014-01-12 DIAGNOSIS — M79609 Pain in unspecified limb: Secondary | ICD-10-CM | POA: Insufficient documentation

## 2014-01-12 DIAGNOSIS — M549 Dorsalgia, unspecified: Secondary | ICD-10-CM | POA: Insufficient documentation

## 2014-01-12 DIAGNOSIS — IMO0001 Reserved for inherently not codable concepts without codable children: Secondary | ICD-10-CM | POA: Insufficient documentation

## 2014-01-12 DIAGNOSIS — I10 Essential (primary) hypertension: Secondary | ICD-10-CM | POA: Insufficient documentation

## 2014-01-12 DIAGNOSIS — C50919 Malignant neoplasm of unspecified site of unspecified female breast: Secondary | ICD-10-CM | POA: Insufficient documentation

## 2014-01-12 DIAGNOSIS — IMO0002 Reserved for concepts with insufficient information to code with codable children: Secondary | ICD-10-CM | POA: Insufficient documentation

## 2014-01-13 ENCOUNTER — Telehealth: Payer: Self-pay | Admitting: Hematology and Oncology

## 2014-01-13 ENCOUNTER — Telehealth: Payer: Self-pay | Admitting: *Deleted

## 2014-01-13 NOTE — Telephone Encounter (Signed)
, °

## 2014-01-13 NOTE — Telephone Encounter (Signed)
Pt called to r/s her appt this Friday 5/01 to next week the 6th or 7th in afternoon.  POF sent to r/s to 5/7 at 2:30 pm if that time is avail.  Informed pt to expect call from Scheduler to confirm. She verbalized understanding.

## 2014-01-14 ENCOUNTER — Ambulatory Visit: Payer: Managed Care, Other (non HMO) | Admitting: Physical Therapy

## 2014-01-14 ENCOUNTER — Telehealth: Payer: Self-pay | Admitting: Internal Medicine

## 2014-01-14 MED ORDER — ALBUTEROL SULFATE HFA 108 (90 BASE) MCG/ACT IN AERS: 2.0000 | INHALATION_SPRAY | RESPIRATORY_TRACT | Status: DC | PRN

## 2014-01-14 NOTE — Telephone Encounter (Signed)
Patient Information:  Caller Name: Karen  Phone: [phone]  Patient: Alexandria [name]  Gender: Female  DOB: 01/12/1953  Age: 63 Years  PCP: Bluford Kaufmann (Family Practice > 63yrs old)  Office Follow Up:  Does the office need to follow up with this patient?: Yes  Instructions For The Office: see notes----requesting some scripts (RFs and alternatives) Pt also provided alt phone number which is her cell [phone]; may call her back on home or cell and please leave a detailed message per pt request.  RN Note:  Pt states she cannot come into office today as she has a PT appt this afternoon that she must go to. States she will tell them that she cannot exert herself at the appt but she cannot miss it due to being discharged from that office in past for missing appts. She states she is calling to request a different antibiotic as she feels this one is not working. She also states she is all out of her Albuterol inhaler which really helps her sxs and requesting a RF. Also states the Hycodan cough syrup tastes terrible and makes her gag and she just cannot force herself to take it and wonders if MD could prescribe the other cough med that she had previously which worked well and didn't taste bad. Assured her will send message and someone will f/u with her soon. She states its ok to leave a detailed voicemail if there is no answer. 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  Symptoms  Reason For Call & Symptoms: Seen on 4/21 for Bronchitis and prescribed Doxycycline. Reports sxs are not improved and maybe slightly worse. Still coughing very frequently which causes some intermittent mild to moderate SOB and intermittent wheezing. Cough is still productive. No fever today but reports temp was 100.5 in ear last night. Pt sounds SOB during beginning of call (speaking in phrases only with frequent coughing) but this does improve during  call and cough subsides and able to speak easily in full sentences at end of call.  Reviewed Health History In EMR: Yes  Reviewed Medications In EMR: Yes  Reviewed Allergies In EMR: Yes  Reviewed Surgeries / Procedures: Yes  Date of Onset of Symptoms: 01/04/2014  Treatments Tried: Doxycycline; Albuterol; prescribed cough med  Treatments Tried Worked: No  Guideline(s) Used:  Breathing Difficulty  Asthma Attack  Disposition Per Guideline:   See Today in Office  Reason For Disposition Reached:   Coughing continuously (nonstop) that keeps from working or sleeping, and not improved after inhaler or nebulizer  Advice Given:  Avoid Triggers:  Avoid known triggers of asthma attacks (e.g., tobacco smoke, cats, other pets, feather pillows, exercise).  Call Back If:  You become worse.  Patient Refused Recommendation:  Patient Refused Appt, Patient Requests Appt At Later Date  Pt states she cannot make it to office today due to having another appt today.

## 2014-01-14 NOTE — Telephone Encounter (Signed)
Spoke to Alexandria [name] told her Dr.K is gone for the day. I sent refill in for Inhaler but will have to wait till tomorrow for Dr. Raliegh Ip to see message to decide on care. Told Alexandria [name] she can use OTC cough medicine if she does not want to take the Hycodan. Told Alexandria [name] I will get back to her tomorrow. Alexandria [name] verbalized understanding.

## 2014-01-15 ENCOUNTER — Other Ambulatory Visit: Payer: Self-pay

## 2014-01-15 MED ORDER — AZITHROMYCIN 250 MG PO TABS
ORAL_TABLET | ORAL | Status: DC
Start: 2014-01-15 — End: 2014-01-22

## 2014-01-15 MED ORDER — BENZONATATE 200 MG PO CAPS: 200.0000 mg | ORAL_CAPSULE | Freq: Three times a day (TID) | ORAL | Status: DC | PRN

## 2014-01-15 NOTE — Addendum Note (Signed)
Addended by: Marian Sorrow on: 01/15/2014 02:14 PM   Modules accepted: Orders

## 2014-01-15 NOTE — Telephone Encounter (Signed)
Spoke to pt told her 2 Rx's sent to pharmacy, Z-pak and Tessalon capsules. Pt verbalized understanding.

## 2014-01-15 NOTE — Telephone Encounter (Signed)
Please call in a prescription for a Z-Pak and Tessalon Perles 200 mg, #30, one 3 times a day as needed

## 2014-01-15 NOTE — Telephone Encounter (Signed)
Please see message from pt and advise

## 2014-01-15 NOTE — Telephone Encounter (Signed)
Left message on voicemail asking where to send Rx's.

## 2014-01-16 ENCOUNTER — Ambulatory Visit: Payer: Self-pay | Admitting: Hematology and Oncology

## 2014-01-20 ENCOUNTER — Ambulatory Visit: Payer: Medicare Other | Attending: Oncology | Admitting: Physical Therapy

## 2014-01-20 DIAGNOSIS — F411 Generalized anxiety disorder: Secondary | ICD-10-CM | POA: Insufficient documentation

## 2014-01-20 DIAGNOSIS — C50919 Malignant neoplasm of unspecified site of unspecified female breast: Secondary | ICD-10-CM | POA: Diagnosis not present

## 2014-01-20 DIAGNOSIS — R609 Edema, unspecified: Secondary | ICD-10-CM | POA: Diagnosis not present

## 2014-01-20 DIAGNOSIS — IMO0002 Reserved for concepts with insufficient information to code with codable children: Secondary | ICD-10-CM | POA: Insufficient documentation

## 2014-01-20 DIAGNOSIS — I10 Essential (primary) hypertension: Secondary | ICD-10-CM | POA: Diagnosis not present

## 2014-01-20 DIAGNOSIS — IMO0001 Reserved for inherently not codable concepts without codable children: Secondary | ICD-10-CM | POA: Diagnosis not present

## 2014-01-20 DIAGNOSIS — M549 Dorsalgia, unspecified: Secondary | ICD-10-CM | POA: Insufficient documentation

## 2014-01-20 DIAGNOSIS — M79609 Pain in unspecified limb: Secondary | ICD-10-CM | POA: Insufficient documentation

## 2014-01-21 ENCOUNTER — Ambulatory Visit
Admission: RE | Admit: 2014-01-21 | Discharge: 2014-01-21 | Disposition: A | Discharge status: Home / Self Care | Payer: Medicare Other | Source: Ambulatory Visit | Attending: Hematology and Oncology | Admitting: Hematology and Oncology

## 2014-01-21 DIAGNOSIS — K449 Diaphragmatic hernia without obstruction or gangrene: Secondary | ICD-10-CM | POA: Diagnosis not present

## 2014-01-21 DIAGNOSIS — K7689 Other specified diseases of liver: Secondary | ICD-10-CM | POA: Diagnosis not present

## 2014-01-21 DIAGNOSIS — R911 Solitary pulmonary nodule: Secondary | ICD-10-CM

## 2014-01-21 DIAGNOSIS — C50419 Malignant neoplasm of upper-outer quadrant of unspecified female breast: Secondary | ICD-10-CM

## 2014-01-21 DIAGNOSIS — D649 Anemia, unspecified: Secondary | ICD-10-CM

## 2014-01-21 DIAGNOSIS — R5383 Other fatigue: Secondary | ICD-10-CM

## 2014-01-21 DIAGNOSIS — R600 Localized edema: Secondary | ICD-10-CM

## 2014-01-21 DIAGNOSIS — J984 Other disorders of lung: Secondary | ICD-10-CM | POA: Diagnosis not present

## 2014-01-21 DIAGNOSIS — R109 Unspecified abdominal pain: Secondary | ICD-10-CM

## 2014-01-21 DIAGNOSIS — K439 Ventral hernia without obstruction or gangrene: Secondary | ICD-10-CM | POA: Diagnosis not present

## 2014-01-21 DIAGNOSIS — K56609 Unspecified intestinal obstruction, unspecified as to partial versus complete obstruction: Secondary | ICD-10-CM

## 2014-01-22 ENCOUNTER — Encounter: Payer: Self-pay | Admitting: Hematology and Oncology

## 2014-01-22 ENCOUNTER — Ambulatory Visit (HOSPITAL_BASED_OUTPATIENT_CLINIC_OR_DEPARTMENT_OTHER): Payer: Medicare Other | Admitting: Hematology and Oncology

## 2014-01-22 ENCOUNTER — Encounter: Payer: Self-pay | Admitting: Physical Therapy

## 2014-01-22 VITALS — BP 145/84 | HR 88 | Temp 97.8°F | Resp 20 | Ht 68.0 in

## 2014-01-22 DIAGNOSIS — K439 Ventral hernia without obstruction or gangrene: Secondary | ICD-10-CM

## 2014-01-22 DIAGNOSIS — C50419 Malignant neoplasm of upper-outer quadrant of unspecified female breast: Secondary | ICD-10-CM

## 2014-01-22 DIAGNOSIS — R609 Edema, unspecified: Secondary | ICD-10-CM | POA: Diagnosis not present

## 2014-01-22 DIAGNOSIS — R599 Enlarged lymph nodes, unspecified: Secondary | ICD-10-CM

## 2014-01-22 DIAGNOSIS — F411 Generalized anxiety disorder: Secondary | ICD-10-CM

## 2014-01-22 MED ORDER — LORAZEPAM 0.5 MG PO TABS: 0.5000 mg | ORAL_TABLET | Freq: Two times a day (BID) | ORAL | Status: DC | PRN

## 2014-01-22 MED ORDER — ESCITALOPRAM OXALATE 20 MG PO TABS: 20.0000 mg | ORAL_TABLET | Freq: Every day | ORAL | Status: DC

## 2014-01-22 NOTE — Progress Notes (Signed)
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 From June 2014 to present, she was noted to be progressively anemic to the point requiring blood transfusion in February 2015.  The patient had undergone extensive evaluation in [place] prior to moving to [place]. Unfortunately I do not have records of those. According to her, she had EGD and colonoscopy and had received multiple units of blood transfusion last year. According to the patient, the hemoglobin dropped to 3 g at some point in time. She had received blood transfusion twice. With the most recent blood transfusion, according to the patient, she had a transfusion reaction. According to the blood typing on 11/19/2013, warm antibody was detected. The patient was prescribed oral iron supplements and she takes it 3 times a day with an empty stomach. She received 1 dose of intravenous iron 3 weeks ago. She also complained of diffuse edema and has gained over 100 pounds of weight. 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 Malignant neoplasm of upper-outer quadrant of female breast   Primary site: Breast (Right)   Staging method: AJCC 7th Edition   Pathologic: Stage IA (T1, N0, cM0) signed by Deatra [facility], MD on 03/26/2013  8:21 AM   Summary: Stage IA (T1, N0, cM0) In January 2014 she had a mammogram performed that showed an  abnormality in the right breast. This was in [place]. 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 Patient went on to have a right lumpectomy performed revealing a T1cN0 disease stage at diagnosis stage I. She subsequently had Oncotype DX performed that showed a recurrence score of 23 giving her a 10 year risk for recurrence but 10-30% in the intermediate risk category. Because of this she received adjuvant chemotherapy consisting of 4 cycles of Taxotere and Cytoxan. She tolerated it well. She completed adjuvant radiation therapy from 03/04/2013 through 04/17/2013. She started taking Arimidex's from 05/16/2013.  INTERVAL HISTORY: Alexandria [name] 63 y.o. female returns for further followup. She has significant anxiety and is very miserable with discomfort on her abdominal wall and difficulties with breathing due to a sensation of pressure in her chest wall. She also complained of left lower extremity edema which bothers her.  I have reviewed the past medical history, past surgical history, social history and family history with the patient and they are unchanged from previous note.  ALLERGIES:  is allergic to contrast media; albuterol; and prednisone.  MEDICATIONS:  Current Outpatient Prescriptions  Medication Sig Dispense Refill  . albuterol (PROAIR HFA) 108 (90 BASE) MCG/ACT inhaler Inhale 2 puffs into the lungs every 4 (four) hours as needed.  18 g  5  . anastrozole (ARIMIDEX) 1 MG tablet Take 1 mg by mouth daily.      . benzonatate (TESSALON) 200 MG capsule Take 1 capsule (200 mg total) by  mouth 3 (three) times daily as needed for cough.  30 capsule  0  . budesonide (PULMICORT) 180 MCG/ACT inhaler Inhale 2 puffs into the lungs 2 (two) times daily.  1 Inhaler  6  . escitalopram (LEXAPRO) 20 MG tablet Take 1 tablet (20 mg total) by mouth  daily.  90 tablet  3  . ferrous sulfate (FERROUSUL) 325 (65 FE) MG tablet Take 1 tablet (325 mg total) by mouth 3 (three) times daily with meals.  90 tablet  3  . furosemide (LASIX) 80 MG tablet Take 80 mg by mouth 2 (two) times daily. Increase to three times daily if gain more 2 lbs in a day.      . furosemide (LASIX) 80 MG tablet TAKE 1 TABLET BY MOUTH TWICE DAILY, MAY TAKE AN EXTRA TABLET IF NEEDED FOR INCREASED SWELLING OR RAPID WEIGHT GAIN  90 tablet  1  . HYDROmorphone (DILAUDID) 4 MG tablet Take 4 mg by mouth 3 (three) times daily as needed (pain).       Marland Kitchen HYDROmorphone HCl (EXALGO) 12 MG T24A SR tablet Take 12 mg by mouth 2 (two) times daily.      Marland Kitchen levalbuterol (XOPENEX) 0.63 MG/3ML nebulizer solution Take 3 mLs (0.63 mg total) by nebulization every 4 (four) hours as needed for wheezing or shortness of breath.  3 mL  12  . lidocaine (LIDODERM) 5 % Place 1-3 patches onto the skin daily. Remove & Discard patch within 12 hours or as directed by MD      . lidocaine-prilocaine (EMLA) cream Apply 1 application topically as needed (port access).       . LORazepam (ATIVAN) 0.5 MG tablet Take 1 tablet (0.5 mg total) by mouth 2 (two) times daily as needed for anxiety.  60 tablet  1  . lubiprostone (AMITIZA) 24 MCG capsule Take 1 capsule (24 mcg total) by mouth 2 (two) times daily with a meal.  60 capsule  2  . Multiple Vitamins-Minerals (HAIR/SKIN/NAILS PO) Take 1 tablet by mouth every morning.      . pantoprazole (PROTONIX) 40 MG tablet Take 1 tablet (40 mg total) by mouth daily.  30 tablet  0  . potassium chloride SA (K-DUR,KLOR-CON) 20 MEQ tablet Take 20-40 mEq by mouth 3 (three) times daily.      Marland Kitchen HYDROcodone-homatropine (HYCODAN) 5-1.5 MG/5ML syrup Take 5 mLs by mouth every 6 (six) hours as needed for cough.  120 mL  0   No current facility-administered medications for this visit.    REVIEW OF SYSTEMS:   Constitutional: Denies fevers, chills or abnormal weight loss All other systems were  reviewed with the patient and are negative.  PHYSICAL EXAMINATION: ECOG PERFORMANCE STATUS: 1 - Symptomatic but completely ambulatory  Filed Vitals:   01/22/14 1440  BP: 145/84  Pulse: 88  Temp: 97.8 F (36.6 C)  Resp: 20   Filed Weights    GENERAL:alert, no distress and comfortable. She is morbidly obese SKIN: skin color, texture, turgor are normal, no rashes or significant lesions EYES: normal, Conjunctiva are pink and non-injected, sclera clear Musculoskeletal:no cyanosis of digits and no clubbing  NEURO: alert & oriented x 3 with fluent speech, no focal motor/sensory deficits  LABORATORY DATA:  I have reviewed the data as listed    Component Value Date/Time   NA 142 01/01/2014 1400   NA 141 12/11/2013 1444   K 3.2* 01/01/2014 1400   K 4.3 12/11/2013 1444   CL 101 12/11/2013 1444   CL 99  03/04/2013 1239   CO2 30* 01/01/2014 1400   CO2 28 12/11/2013 1444   GLUCOSE 132 01/01/2014 1400   GLUCOSE 122* 12/11/2013 1444   GLUCOSE 125* 03/04/2013 1239   BUN 17.3 01/01/2014 1400   BUN 15 12/11/2013 1444   CREATININE 1.0 01/01/2014 1400   CREATININE 0.87 12/11/2013 1444   CALCIUM 9.8 01/01/2014 1400   CALCIUM 9.3 12/11/2013 1444   PROT 7.7 01/01/2014 1400   PROT 6.8 12/11/2013 1444   ALBUMIN 3.7 01/01/2014 1400   ALBUMIN 3.3* 12/11/2013 1444   AST 32 01/01/2014 1400   AST 24 12/11/2013 1444   ALT 29 01/01/2014 1400   ALT 17 12/11/2013 1444   ALKPHOS 155* 01/01/2014 1400   ALKPHOS 167* 12/11/2013 1444   BILITOT 0.25 01/01/2014 1400   BILITOT <0.2* 12/11/2013 1444   GFRNONAA 71* 12/11/2013 1444   GFRAA 82* 12/11/2013 1444    No results found for this basename: SPEP,  UPEP,   kappa and lambda light chains    Lab Results  Component Value Date   WBC 5.4 01/01/2014   NEUTROABS 3.6 01/01/2014   HGB 12.3 01/01/2014   HCT 39.6 01/01/2014   MCV 78.9* 01/01/2014   PLT 214 01/01/2014      Chemistry      Component Value Date/Time   NA 142 01/01/2014 1400   NA 141 12/11/2013 1444   K 3.2* 01/01/2014  1400   K 4.3 12/11/2013 1444   CL 101 12/11/2013 1444   CL 99 03/04/2013 1239   CO2 30* 01/01/2014 1400   CO2 28 12/11/2013 1444   BUN 17.3 01/01/2014 1400   BUN 15 12/11/2013 1444   CREATININE 1.0 01/01/2014 1400   CREATININE 0.87 12/11/2013 1444      Component Value Date/Time   CALCIUM 9.8 01/01/2014 1400   CALCIUM 9.3 12/11/2013 1444   ALKPHOS 155* 01/01/2014 1400   ALKPHOS 167* 12/11/2013 1444   AST 32 01/01/2014 1400   AST 24 12/11/2013 1444   ALT 29 01/01/2014 1400   ALT 17 12/11/2013 1444   BILITOT 0.25 01/01/2014 1400   BILITOT <0.2* 12/11/2013 1444       RADIOGRAPHIC STUDIES: I reviewed the imaging study with her and her husband I have personally reviewed the radiological images as listed and agreed with the findings in the report. Ct Abdomen Pelvis Wo Contrast  01/21/2014   CLINICAL DATA:  History of recent small bowel obstruction. History of renal stones. Weight gain. Recent bronchitis. Cough. Chest pain and shortness of breath. Prior hernia repair x2. History of right breast cancer in 2014 with lumpectomy. Chemotherapy and radiation therapy.  EXAM: CT CHEST, ABDOMEN AND PELVIS WITHOUT CONTRAST  TECHNIQUE: Multidetector CT imaging of the chest, abdomen and pelvis was performed following the standard protocol without IV contrast.  COMPARISON:  12/11/2013 abdominal pelvic CT. Most recent chest CT 10/02/2012. That report is not available.  FINDINGS: CT CHEST FINDINGS  Lungs/Pleura: Moderate left hemidiaphragm elevation. Subtle right upper lobe 3 mm nodule on image 30/series 4. Not readily identified on the prior. Favored to represent a subpleural lymph node.  Scarring at the right lung base, with a somewhat more nodular component medially. The nodular component is new since the prior of 2014. 8 mm on image 49. Left apical scarring as well. Volume loss adjacent the elevated left hemidiaphragm.  No pleural fluid.  Heart/Mediastinum: No supraclavicular adenopathy. A left-sided Port-A-Cath terminates at  the high right atrium. Multivessel coronary artery atherosclerosis. Right breast surgical clips. No  axillary adenopathy.  Mild cardiomegaly, without pericardial effusion.  Right paratracheal node measures 1.4 cm on image 24 versus 5 mm on the prior.  Developing azygos esophageal recess borderline adenopathy at 1.0 cm. Hilar regions poorly evaluated without intravenous contrast.  A large hiatal hernia, with approximately 1/2 of the stomach positioned within the chest. This is similar.  No internal mammary adenopathy.  CT ABDOMEN AND PELVIS FINDINGS  Abdomen/Pelvis: Mild hepatic steatosis with sparing adjacent the gallbladder. Splenules. Normal pancreas, gallbladder, biliary tract, adrenal glands, kidneys. No retroperitoneal or retrocrural adenopathy. Colonic stool burden suggests constipation.  No small bowel obstruction. A loop of small bowel is again positioned within a right paracentral ventral abdominal pelvic junction wall hernia. Example image 86/series 3. No evidence of strangulation. There may be mild complexity to this defect, with small bowel extending towards the lateral portion of the wall on image 84/ series 3.  No evidence of omental or peritoneal disease.  Left inguinal node is mildly enlarged 1.7 cm on image 116. This may not have been included on the prior abdominal pelvic CT. No other pelvic adenopathy.  Hysterectomy. Normal urinary bladder. No adnexal mass or significant free fluid.  Bones/Musculoskeletal: Grade 1 L5-S1 anterolisthesis, favored to be degenerative. Advanced facet arthropathy at this level.  IMPRESSION: CT CHEST IMPRESSION  1. Developing thoracic adenopathy. This could be reactive or related to metastatic disease. Short-term imaging follow-up versus PET should be considered. 2. Moderate left hemidiaphragm elevation with right base scarring. Areas of subtle right upper lobe and right lower lobe nodularity which are not readily apparent on the prior exam. The right upper lobe is  favored to represent a subpleural lymph node. The right lower lobe is favored to represent progression of scarring. However, these warrant followup attention. 3. Large hiatal hernia. 4. Age advanced coronary artery atherosclerosis. Recommend assessment of coronary risk factors and consideration of medical therapy.  CT ABDOMEN AND PELVIS IMPRESSION  1. Similar small bowel containing ventral abdominal pelvic wall hernia without evidence of obstruction or strangulation. 2.  Possible constipation. 3. Isolated left inguinal adenopathy. Favored to be reactive. Not in the typical drainage pattern for metastatic breast cancer. Recommend attention on follow-up. 4. Hepatic steatosis.   Electronically Signed   By: Abigail Miyamoto M.D.   On: 01/21/2014 17:02   Ct Chest Wo Contrast  01/21/2014   CLINICAL DATA:  History of recent small bowel obstruction. History of renal stones. Weight gain. Recent bronchitis. Cough. Chest pain and shortness of breath. Prior hernia repair x2. History of right breast cancer in 2014 with lumpectomy. Chemotherapy and radiation therapy.  EXAM: CT CHEST, ABDOMEN AND PELVIS WITHOUT CONTRAST  TECHNIQUE: Multidetector CT imaging of the chest, abdomen and pelvis was performed following the standard protocol without IV contrast.  COMPARISON:  12/11/2013 abdominal pelvic CT. Most recent chest CT 10/02/2012. That report is not available.  FINDINGS: CT CHEST FINDINGS  Lungs/Pleura: Moderate left hemidiaphragm elevation. Subtle right upper lobe 3 mm nodule on image 30/series 4. Not readily identified on the prior. Favored to represent a subpleural lymph node.  Scarring at the right lung base, with a somewhat more nodular component medially. The nodular component is new since the prior of 2014. 8 mm on image 49. Left apical scarring as well. Volume loss adjacent the elevated left hemidiaphragm.  No pleural fluid.  Heart/Mediastinum: No supraclavicular adenopathy. A left-sided Port-A-Cath terminates at the high  right atrium. Multivessel coronary artery atherosclerosis. Right breast surgical clips. No axillary adenopathy.  Mild cardiomegaly, without  pericardial effusion.  Right paratracheal node measures 1.4 cm on image 24 versus 5 mm on the prior.  Developing azygos esophageal recess borderline adenopathy at 1.0 cm. Hilar regions poorly evaluated without intravenous contrast.  A large hiatal hernia, with approximately 1/2 of the stomach positioned within the chest. This is similar.  No internal mammary adenopathy.  CT ABDOMEN AND PELVIS FINDINGS  Abdomen/Pelvis: Mild hepatic steatosis with sparing adjacent the gallbladder. Splenules. Normal pancreas, gallbladder, biliary tract, adrenal glands, kidneys. No retroperitoneal or retrocrural adenopathy. Colonic stool burden suggests constipation.  No small bowel obstruction. A loop of small bowel is again positioned within a right paracentral ventral abdominal pelvic junction wall hernia. Example image 86/series 3. No evidence of strangulation. There may be mild complexity to this defect, with small bowel extending towards the lateral portion of the wall on image 84/ series 3.  No evidence of omental or peritoneal disease.  Left inguinal node is mildly enlarged 1.7 cm on image 116. This may not have been included on the prior abdominal pelvic CT. No other pelvic adenopathy.  Hysterectomy. Normal urinary bladder. No adnexal mass or significant free fluid.  Bones/Musculoskeletal: Grade 1 L5-S1 anterolisthesis, favored to be degenerative. Advanced facet arthropathy at this level.  IMPRESSION: CT CHEST IMPRESSION  1. Developing thoracic adenopathy. This could be reactive or related to metastatic disease. Short-term imaging follow-up versus PET should be considered. 2. Moderate left hemidiaphragm elevation with right base scarring. Areas of subtle right upper lobe and right lower lobe nodularity which are not readily apparent on the prior exam. The right upper lobe is favored to  represent a subpleural lymph node. The right lower lobe is favored to represent progression of scarring. However, these warrant followup attention. 3. Large hiatal hernia. 4. Age advanced coronary artery atherosclerosis. Recommend assessment of coronary risk factors and consideration of medical therapy.  CT ABDOMEN AND PELVIS IMPRESSION  1. Similar small bowel containing ventral abdominal pelvic wall hernia without evidence of obstruction or strangulation. 2.  Possible constipation. 3. Isolated left inguinal adenopathy. Favored to be reactive. Not in the typical drainage pattern for metastatic breast cancer. Recommend attention on follow-up. 4. Hepatic steatosis.   Electronically Signed   By: Abigail Miyamoto M.D.   On: 01/21/2014 17:02    ASSESSMENT & PLAN:  #1 iron deficiency anemia, resolved This is adequately replaced. I recommend she reduced her oral iron supplements to once at night. I will see her back in 6 months with repeat blood work. 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 I recommend continued screening mammogram on a yearly basis #3 large hiatal hernia #4 symptomatic abdominal wall hernia #5 large left inguinal lymphadenopathy with lower extremity edema She has appointment to see Gen. surgery tomorrow. Clinically, she has a lot of symptoms related to sensation of difficulties with breathing, discomfort in her abdomen and sensation of bloating. On review of the imaging study, there is a large hiatal hernia. She also has lot of stools in her abdomen. I recommend she start regular laxatives. The left inguinal adenopathy is causing significant edema. I recommend biopsy by Gen. surgery for further evaluation.   Orders Placed This Encounter  Procedures  . CBC & Diff and Retic    Standing Status: Future     Number of Occurrences:      Standing Expiration Date: 01/22/2015  . Ferritin    Standing Status: Future     Number of  Occurrences:  Standing Expiration Date: 01/22/2015   All questions were answered. The patient knows to call the clinic with any problems, questions or concerns. No barriers to learning was detected. I spent 25 minutes counseling the patient face to face. The total time spent in the appointment was 30 minutes and more than 50% was on counseling and review of test results     Heath Lark, MD 01/22/2014 7:48 PM

## 2014-01-23 ENCOUNTER — Encounter (INDEPENDENT_AMBULATORY_CARE_PROVIDER_SITE_OTHER): Payer: Self-pay | Admitting: General Surgery

## 2014-01-23 ENCOUNTER — Ambulatory Visit (INDEPENDENT_AMBULATORY_CARE_PROVIDER_SITE_OTHER): Payer: Medicare Other | Admitting: General Surgery

## 2014-01-23 VITALS — BP 126/72 | HR 84 | Temp 98.7°F | Resp 14 | Ht 66.0 in | Wt 239.0 lb

## 2014-01-23 DIAGNOSIS — K439 Ventral hernia without obstruction or gangrene: Secondary | ICD-10-CM | POA: Diagnosis not present

## 2014-01-23 DIAGNOSIS — K449 Diaphragmatic hernia without obstruction or gangrene: Secondary | ICD-10-CM | POA: Diagnosis not present

## 2014-01-23 NOTE — Patient Instructions (Signed)
Will get old operative records Will refer to Dr. Hassell Done

## 2014-01-23 NOTE — Progress Notes (Signed)
Patient ID: Alexandria [name], female   DOB: 1951/04/07, 63 y.o.   MRN: [id number]  Chief Complaint  Patient presents with  . New Evaluation    Eval umb hernia/LIH    HPI Alexandria [name] is a 63 y.o. female.  We're asked to see the patient in consultation by Dr. Burnice Logan to evaluate her For an umbilical hernia. The patient is a 63 year old white female who presents complaining of multiple things. First of all she complains of central sort of abdominal pain. She states that the pain occurs every day. She has had some nausea and vomiting associated with it although she also states that she has been gaining weight. She does complain of constipation but states that she is unable to take MiraLAX. She has been drinking bottles of mag citrate on occasion. She does have a history of multiple abdominal surgeries the last of which occurred by her report at [facility] in [place] about 5 years ago where she states that she had a ventral hernia repair with mesh. She also complains of chest pain with reflux. Her most recent CT scan showed a large hiatal hernia with most of her stomach up in her chest. The CT also showed a small umbilical hernia with a loop of small bowel that was not obstructed and it. She also complains of swelling in both lower extremities. She also has a history of breast cancer that was treated with lumpectomy chemotherapy and radiation therapy which also took place in [place] a year ago. Also of note her recent CT scan showed some adenopathy in her mediastinum as well as a small lymph node in her left groin area. We tried to contact [facility] in [place] to her medical records and they have no record of her is a patient HPI  Past Medical History  Diagnosis Date  . Iron deficiency anemia   . Status post chemotherapy     4 cycles of Taxotere and cytoxan  . Chronic back pain   . Hx of pulmonary embolus     During c-Section  . Neuropathy   . Obesity     Class 2  . COPD  (chronic obstructive pulmonary disease)   . S/P radiation therapy  03/04/2013-04/17/2013    1) Right breast / 50 Gy in 25 fractions/ 2) Right breast boost / 10 Gy in 5 fractions  . Diastolic heart failure   . Hx of radiation therapy 03/04/13- 04/17/13    right breast 50 Gy 25 fractions, right breast boost 10 Gy 5 fractions  . 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 Cesarean section    . Right breast lumpectomy      Family History  Problem Relation Age of Onset  . Parkinson's disease Mother     Social History History  Substance Use Topics  . Smoking status: Never Smoker   . Smokeless tobacco: Never Used  . Alcohol Use: No    Allergies  Allergen Reactions  . Contrast Media [Iodinated Diagnostic Agents] Anaphylaxis  . Albuterol Other (See Comments)    Agitation   . Prednisone Other (See Comments)    Pt gets very agitated when she takes high doses of steroids    Current Outpatient Prescriptions  Medication Sig Dispense Refill  . budesonide (PULMICORT) 180 MCG/ACT inhaler Inhale 2 puffs into  the lungs 2 (two) times daily.  1 Inhaler  6  . escitalopram (LEXAPRO) 20 MG tablet Take 1 tablet (20 mg total) by mouth daily.  90 tablet  3  . ferrous sulfate (FERROUSUL) 325 (65 FE) MG tablet Take 1 tablet (325 mg total) by mouth 3 (three) times daily with meals.  90 tablet  3  . HYDROmorphone (DILAUDID) 4 MG tablet Take 4 mg by mouth 3 (three) times daily as needed (pain).       Marland Kitchen HYDROmorphone HCl (EXALGO) 12 MG T24A SR tablet Take 12 mg by mouth 2 (two) times daily.      Marland Kitchen levalbuterol (XOPENEX) 0.63 MG/3ML nebulizer solution Take 3 mLs (0.63 mg total) by nebulization every 4 (four) hours as needed for wheezing or shortness of breath.  3 mL  12  . lidocaine (LIDODERM) 5 % Place 1-3 patches onto the skin daily. Remove & Discard patch  within 12 hours or as directed by MD      . lidocaine-prilocaine (EMLA) cream Apply 1 application topically as needed (port access).       . LORazepam (ATIVAN) 0.5 MG tablet Take 1 tablet (0.5 mg total) by mouth 2 (two) times daily as needed for anxiety.  60 tablet  1  . lubiprostone (AMITIZA) 24 MCG capsule Take 1 capsule (24 mcg total) by mouth 2 (two) times daily with a meal.  60 capsule  2  . Multiple Vitamins-Minerals (HAIR/SKIN/NAILS PO) Take 1 tablet by mouth every morning.      . pantoprazole (PROTONIX) 40 MG tablet Take 1 tablet (40 mg total) by mouth daily.  30 tablet  0  . potassium chloride SA (K-DUR,KLOR-CON) 20 MEQ tablet Take 20-40 mEq by mouth 3 (three) times daily.      Marland Kitchen albuterol (PROAIR HFA) 108 (90 BASE) MCG/ACT inhaler Inhale 2 puffs into the lungs every 4 (four) hours as needed.  18 g  5  . anastrozole (ARIMIDEX) 1 MG tablet Take 1 mg by mouth daily.      . furosemide (LASIX) 80 MG tablet Take 80 mg by mouth 2 (two) times daily. Increase to three times daily if gain more 2 lbs in a day.      . furosemide (LASIX) 80 MG tablet TAKE 1 TABLET BY MOUTH TWICE DAILY, MAY TAKE AN EXTRA TABLET IF NEEDED FOR INCREASED SWELLING OR RAPID WEIGHT GAIN  90 tablet  1   No current facility-administered medications for this visit.    Review of Systems Review of Systems  Constitutional: Positive for unexpected weight change.  HENT: Negative.   Eyes: Negative.   Respiratory: Negative.   Cardiovascular: Positive for leg swelling.  Gastrointestinal: Positive for nausea, vomiting and abdominal pain.  Endocrine: Negative.   Genitourinary: Negative.   Musculoskeletal: Negative.   Skin: Negative.   Allergic/Immunologic: Negative.   Neurological: Negative.   Hematological: Negative.   Psychiatric/Behavioral: Negative.     Blood pressure 126/72, pulse 84, temperature 98.7 F (37.1 C), resp. rate 14, height 5' 6"  (1.676 m), weight 239 lb (108.41 kg), last menstrual period  01/30/2005.  Physical Exam Physical Exam  Constitutional: She is oriented to person, place, and time. She appears well-developed and well-nourished.  HENT:  Head: Normocephalic and atraumatic.  Eyes: Conjunctivae and EOM are normal. Pupils are equal, round, and reactive to light.  Neck: Normal range of motion. Neck supple.  Cardiovascular: Normal rate, regular rhythm and normal heart sounds.   Pulmonary/Chest: Effort normal and breath sounds normal.  Abdominal:  Soft. Bowel sounds are normal.  There is a small reducible umbilical hernia. There is no abdominal distention or obstruction. There is no guarding or peritonitis.  Musculoskeletal: Normal range of motion.  She has bilateral lower extremity edema with venous insufficiency changes  Lymphadenopathy:    She has no cervical adenopathy.  Neurological: She is alert and oriented to person, place, and time.  Skin: Skin is warm and dry.  Psychiatric: She has a normal mood and affect. Her behavior is normal.    Data Reviewed As above  Assessment    The patient appears to have multiple issues. #1 and most important I believe is the significant hiatal hernia that she has. I will plan to refer her to one of my partners that does foregut surgery for his evaluation. I will also order an upper GI as part of this workup. #2 is that she has a small periumbilical ventral hernia. She is symptomatic from this. If she comes to require surgery for her hiatal hernia then I think this could be repaired at the same time. #3 she has a small lymph node in her left groin which I think is highly unlikely to be causing her bilateral lower extremity edema. More importantly she seems to have some abnormal adenopathy in the mediastinum and given her history of breast cancer I think this needs to be evaluated by her medical oncologist.     Plan    Plan for upper GI and referral to one of our foregut surgeon's for further evaluation        Luella Cook  III 01/23/2014, 5:02 PM

## 2014-01-25 ENCOUNTER — Other Ambulatory Visit: Payer: Self-pay | Admitting: Hematology and Oncology

## 2014-01-25 DIAGNOSIS — C50419 Malignant neoplasm of upper-outer quadrant of unspecified female breast: Secondary | ICD-10-CM

## 2014-01-25 DIAGNOSIS — R591 Generalized enlarged lymph nodes: Secondary | ICD-10-CM

## 2014-01-26 ENCOUNTER — Telehealth: Payer: Self-pay | Admitting: Hematology and Oncology

## 2014-01-26 ENCOUNTER — Telehealth: Payer: Self-pay | Admitting: *Deleted

## 2014-01-26 NOTE — Telephone Encounter (Signed)
Message copied by Cathlean Cower on Mon Jan 26, 2014  8:41 AM ------      Message from: [facility], [place]: Sun Jan 25, 2014  8:50 PM      Regarding: FW: need assessment & biopsy       Referral to pulmonology is sent      Please let her know      The LN in the chest is small but the surgeon felt they need to be addressed so I will go ahead and place the order for pulmonologist for workup            ----- Message -----         From: Merrie Roof, MD         Sent: 01/24/2014   6:56 AM           To: Heath Lark, MD      Subject: RE: need assessment & biopsy                             i think her hiatal hernia is her biggest problem. The LN in the left groin isn't that big and is not causing her leg edema. She does have mediastinal adenopathy that may need to be worked up. Thanks.      ----- Message -----         From: Heath Lark, MD         Sent: 01/22/2014   7:58 PM           To: Merrie Roof, MD      Subject: need assessment & biopsy                                 Dr. Marlou Starks,            I saw this patient today. She is miserable with a lot of abdominal symptoms. CT scan showed large hiatal hernia, abdominal wall hernia and large left inguinal LN causing leg edema.            I would appreciate if you could assess whether she is a surgical candidate for hernia repair and LN biopsy            Thanks             ------

## 2014-01-26 NOTE — Telephone Encounter (Signed)
, °

## 2014-01-26 NOTE — Telephone Encounter (Signed)
Informed pt of referral to Pulmonary to eval lymph node in chest.  Also informed recommend cancel port a cath removal for now.  Pt agreed.  Pt c/o severe pain in chest likely due to her large hiatal hernia. States she is so miserable she may have to go to ED.   Called IR to cancel PAC removal this week.  Will r/s at later date if needed.

## 2014-01-26 NOTE — Telephone Encounter (Signed)
S/w the pt and she is aware of her nov appts. °

## 2014-01-27 ENCOUNTER — Encounter: Payer: Self-pay | Admitting: Physical Therapy

## 2014-01-28 ENCOUNTER — Institutional Professional Consult (permissible substitution): Payer: Self-pay | Admitting: Pulmonary Disease

## 2014-01-29 ENCOUNTER — Encounter: Payer: Self-pay | Admitting: Physical Therapy

## 2014-01-29 ENCOUNTER — Inpatient Hospital Stay (HOSPITAL_COMMUNITY): Admission: RE | Admit: 2014-01-29 | Payer: Self-pay | Source: Ambulatory Visit

## 2014-01-29 ENCOUNTER — Ambulatory Visit (INDEPENDENT_AMBULATORY_CARE_PROVIDER_SITE_OTHER): Payer: Medicare Other | Admitting: Pulmonary Disease

## 2014-01-29 ENCOUNTER — Encounter: Payer: Self-pay | Admitting: Pulmonary Disease

## 2014-01-29 ENCOUNTER — Inpatient Hospital Stay (HOSPITAL_COMMUNITY): Admission: RE | Admit: 2014-01-29 | Payer: Medicare Other | Source: Ambulatory Visit

## 2014-01-29 VITALS — BP 130/80 | HR 78 | Temp 98.1°F | Ht 67.0 in | Wt 226.0 lb

## 2014-01-29 DIAGNOSIS — R599 Enlarged lymph nodes, unspecified: Secondary | ICD-10-CM | POA: Diagnosis not present

## 2014-01-29 DIAGNOSIS — R590 Localized enlarged lymph nodes: Secondary | ICD-10-CM

## 2014-01-29 DIAGNOSIS — R0609 Other forms of dyspnea: Secondary | ICD-10-CM

## 2014-01-29 DIAGNOSIS — R0989 Other specified symptoms and signs involving the circulatory and respiratory systems: Secondary | ICD-10-CM

## 2014-01-29 NOTE — Patient Instructions (Signed)
I suspect your shortness of breath is coming from your very large hiatal hernia and recent weight gain.  I support a surgical evaluation for possible repair. Your lymph nodes are only minimally enlarged.  Would recommend a followup scan of your chest in 62mos to re-evaluate. Will see you back after you have had your hiatal hernia surgery.  We can do breathing studies at that time to evaluate further.

## 2014-01-29 NOTE — Assessment & Plan Note (Signed)
The patient has very mild mediastinal lymphadenopathy by her most recent CT chest, and at this point, I would not pursue a biopsy. She does need close followup, and I would recommend a repeat CT in 3 months to reevaluate.

## 2014-01-29 NOTE — Assessment & Plan Note (Signed)
The patient has a history of progressive dyspnea on exertion ever since she noticed increasing abdominal swelling. She has a very large hiatal hernia that is causing significant restriction to the left lung, and she has also gained 120 pounds over the last one year. Her recent echocardiogram last summer also showed significant left ventricular hypertrophy with diastolic dysfunction. However, she did not have pulmonary hypertension. She had a ventilation perfusion scan last summer that was unremarkable, with no evidence for chronic thromboembolic disease. At this point, I would recommend surgery for her hiatal hernia, and also for her to work aggressively on weight reduction. We can do PFTs after she has recovered from her surgery and further evaluate. She also has worsening lower extremity edema since her chemotherapy, and it is unclear if this has ever been imaged for DVT.  This may need to be done at some point in the future.

## 2014-01-29 NOTE — Progress Notes (Signed)
Subjective:    Patient ID: Daveigh Batty, female    DOB: 02-19-1951, 63 y.o.   MRN: [id number]  HPI The patient is a 63 year old female who I've been asked to see for dyspnea on exertion and also mild mediastinal lymphadenopathy by CT chest. The patient tells me that she has had worsening shortness of breath ever since she has noticed increased abdominal girth, and was ultimately found to have an enlarging hiatal hernia. She has dyspnea just doing day-to-day activities, and whenever she does more exertional activities as well. She has a mild cough primarily associated with eating, and this results in clear mucus production. She has had a CT of her chest that shows at least 50% of her stomach in her left chest, with significant compromise of left lung volume. She was also noted to have a 1.4 cm right paratracheal node, and a 1 cm lymph node in the azygos esophageal recess. There was also a 3 mm right upper lobe nodule, along with scarring in the right base with associated nodularity. The patient has a history of breast cancer diagnosed in January of last year, and underwent right lumpectomy and radiation therapy that finished in July of last year. She also has a history of a pulmonary embolus in the distant past, but had a ventilation perfusion scan last summer that was unremarkable. She has had an echocardiogram last year that showed a hyperdynamic left ventricle with an ejection fraction of 70%. There was evidence for diastolic dysfunction, but no evidence for elevated pulmonary artery pressures. She has had chronic lower extremity edema, especially on the left since receiving chemotherapy. She has never had lower extremity venous Dopplers. She is scheduled to see a general surgeon in 2 weeks regarding possible surgery for her large hiatal hernia. She has not had recent pulmonary function studies. Of note, the patient's weight has increased 120 pounds over the last one to 2 years.   Review of Systems    Constitutional: Positive for unexpected weight change. Negative for fever.  HENT: Positive for dental problem. Negative for congestion, ear pain, nosebleeds, postnasal drip, rhinorrhea, sinus pressure, sneezing, sore throat and trouble swallowing.   Eyes: Negative for redness and itching.  Respiratory: Positive for cough and shortness of breath. Negative for chest tightness and wheezing.   Cardiovascular: Positive for chest pain and leg swelling. Negative for palpitations.  Gastrointestinal: Positive for abdominal pain. Negative for nausea and vomiting.  Genitourinary: Negative for dysuria.  Musculoskeletal: Negative for joint swelling.  Skin: Negative for rash.  Neurological: Negative for headaches.  Hematological: Does not bruise/bleed easily.  Psychiatric/Behavioral: Negative for dysphoric mood. The patient is nervous/anxious.        Objective:   Physical Exam Constitutional:  Morbidly obese female, no acute distress  HENT:  Nares patent without discharge  Oropharynx without exudate, palate and uvula are normal  Eyes:  Perrla, eomi, no scleral icterus  Neck:  No JVD, no TMG  Cardiovascular:  Normal rate, regular rhythm, no rubs or gallops.  No murmurs        Intact distal pulses but decreased.  Pulmonary :  Decreased bs left base, otherwise clear.. Prominent upper airway pseudowheezing.     Abdominal:  Soft, nondistended, bowel sounds present.  No tenderness noted.   Musculoskeletal:  2+ lower extremity edema noted.  Lymph Nodes:  No cervical lymphadenopathy noted  Skin:  No cyanosis noted  Neurologic:  Alert, appropriate, moves all 4 extremities without obvious deficit.  Assessment & Plan:

## 2014-02-02 ENCOUNTER — Telehealth: Payer: Self-pay | Admitting: Internal Medicine

## 2014-02-02 ENCOUNTER — Ambulatory Visit: Payer: Self-pay | Admitting: Internal Medicine

## 2014-02-02 DIAGNOSIS — G894 Chronic pain syndrome: Secondary | ICD-10-CM | POA: Diagnosis not present

## 2014-02-02 DIAGNOSIS — M5380 Other specified dorsopathies, site unspecified: Secondary | ICD-10-CM | POA: Diagnosis not present

## 2014-02-02 DIAGNOSIS — IMO0001 Reserved for inherently not codable concepts without codable children: Secondary | ICD-10-CM | POA: Diagnosis not present

## 2014-02-02 DIAGNOSIS — M5137 Other intervertebral disc degeneration, lumbosacral region: Secondary | ICD-10-CM | POA: Diagnosis not present

## 2014-02-02 DIAGNOSIS — Z0289 Encounter for other administrative examinations: Secondary | ICD-10-CM

## 2014-02-02 NOTE — Telephone Encounter (Signed)
Patient Information:  Caller Name: Issabela  Phone: [phone]  Patient: Alexandria [name]  Gender: Female  DOB: 03-17-1951  Age: 63 Years  PCP: Bluford Kaufmann (Family Practice > 22yrs old)  Office Follow Up:  Does the office need to follow up with this patient?: No  Instructions For The Office: N/A   Symptoms  Reason For Call & Symptoms: Patient states that she has lymphedema in her left leg and today has bright red "firey red" band around the lower leg near her ankle.  Has pain all the time ,so hard to determine a new pain level.  Reviewed Health History In EMR: Yes  Reviewed Medications In EMR: Yes  Reviewed Allergies In EMR: Yes  Reviewed Surgeries / Procedures: Yes  Date of Onset of Symptoms: 02/02/2014  Guideline(s) Used:  Leg Pain  Ankle Pain  Disposition Per Guideline:   Go to Office Now  Reason For Disposition Reached:   Severe pain (e.g., excruciating, unable to walk) and not improved after 2 hours of pain medicine  Advice Given:  N/A  Patient Will Follow Care Advice:  YES  Appointment Scheduled:  02/02/2014 15:45:00 Appointment Scheduled Provider:  Shawna Orleans, Doe-Hyun Herbie [place]) (Adults only)  She has an appointment at the pain clinic today at 2p and will do her best to get to the 1545 appt. Scheduled. She will call to cancel if she cannot get there on time.  Michela Pitcher that she would go go the emergency clinic tonight if she has too.

## 2014-02-02 NOTE — Telephone Encounter (Signed)
Noted  

## 2014-02-03 ENCOUNTER — Encounter: Payer: Self-pay | Admitting: Physical Therapy

## 2014-02-04 ENCOUNTER — Telehealth (INDEPENDENT_AMBULATORY_CARE_PROVIDER_SITE_OTHER): Payer: Self-pay

## 2014-02-04 NOTE — Telephone Encounter (Signed)
Pt has appt to see Dr Hassell Done on 5/29 to eval hiatal hernia. Pt states that she is know having extreme pain and SOB. Advised pt that if she is having SOB to she needs to go to the ER at this time. Pt verbalized understanding and agrees with POC. Pt states that she would be going to [facility] ER.

## 2014-02-05 ENCOUNTER — Encounter: Payer: Self-pay | Admitting: Physical Therapy

## 2014-02-05 DIAGNOSIS — L821 Other seborrheic keratosis: Secondary | ICD-10-CM | POA: Diagnosis not present

## 2014-02-05 DIAGNOSIS — I872 Venous insufficiency (chronic) (peripheral): Secondary | ICD-10-CM | POA: Diagnosis not present

## 2014-02-05 DIAGNOSIS — L820 Inflamed seborrheic keratosis: Secondary | ICD-10-CM | POA: Diagnosis not present

## 2014-02-05 DIAGNOSIS — I8310 Varicose veins of unspecified lower extremity with inflammation: Secondary | ICD-10-CM | POA: Diagnosis not present

## 2014-02-10 ENCOUNTER — Encounter: Payer: Self-pay | Admitting: Physical Therapy

## 2014-02-13 ENCOUNTER — Encounter (INDEPENDENT_AMBULATORY_CARE_PROVIDER_SITE_OTHER): Payer: Self-pay | Admitting: Surgery

## 2014-02-13 ENCOUNTER — Ambulatory Visit (INDEPENDENT_AMBULATORY_CARE_PROVIDER_SITE_OTHER): Payer: Medicare Other | Admitting: Surgery

## 2014-02-13 VITALS — BP 164/92 | HR 80 | Temp 97.6°F | Resp 18 | Ht 67.0 in | Wt 235.2 lb

## 2014-02-13 DIAGNOSIS — Z01810 Encounter for preprocedural cardiovascular examination: Secondary | ICD-10-CM

## 2014-02-13 NOTE — Patient Instructions (Signed)
Nissen Fundoplication Care After Please read the instructions outlined below and refer to this sheet for the next few weeks. These discharge instructions provide you with general information on caring for yourself after you leave the hospital. Your doctor may also give you specific instructions. While your treatment has been planned according to the most current medical practices available, unavoidable complications sometimes happen. If you have any problems or questions after discharge, please call your doctor. ACTIVITY  Take frequent rest periods throughout the day.  Take frequent walks throughout the day. This will help to prevent blood clots.  Continue to do your coughing and deep breathing exercises once you get home. This will help to prevent pneumonia.  No strenuous activities such as heavy lifting, pushing or pulling until after your follow-up visit with your doctor. Do not lift anything heavier than 10 pounds.  Talk with your caregiver about when you may return to work and your exercise routine.  You may shower 2 days after surgery. Pat incisions dry. Do not rub incisions with washcloth or towel.  Do not drive while taking prescription pain medication. NUTRITION  Continue with a liquid diet, or the diet you were directed to take, until your first follow-up visit with your surgeon.  Drink fluids (6-8 glasses a day).  Call your caregiver for persistent nausea (feeling sick to your stomach), vomiting, bloating or difficulty swallowing. ELIMINATION It is very important not to strain during bowel movements. If constipation should occur, you may:  Take a mild laxative (such as Milk of Magnesia).  Add fruit and bran to your diet.  Drink more fluids.  Call your caregiver if constipation is not relieved. FEVER If you feel feverish or have shaking chills, take your temperature. If it is 102 F (38.9 C) or above, call your caregiver. The fever may mean there is an infection. PAIN  CONTROL  If a prescription was given for a pain reliever, please follow your caregiver's directions.  Only take over-the-counter or prescription medicines for pain, discomfort, or fever as directed by your caregiver.  If the pain is not relieved by your medicine, becomes worse, or you have difficulty breathing, call your doctor. INCISION  It is normal for your cuts (incisions) from surgery to have a small amount of drainage for the first 1-2 days. Once the drainage has stopped, leave your incision(s) open to air.  Check your incision(s) and surrounding area daily for any redness, swelling, increased drainage or bleeding. If any of these are present or if the wound edges start to separate, call your doctor.  If you have small adhesive strips in place, they will peel and fall off. (If these strips are covered with a clear bandage, your doctor will tell you when to remove them.)  If you have staples, your caregiver will remove them at the follow-up appointment. Document Released: 04/27/2004 Document Revised: 11/27/2011 Document Reviewed: 08/01/2007 [employer] Patient Information 2014 [employer].  

## 2014-02-13 NOTE — Progress Notes (Signed)
Chief Complaint:  Shortness of breath and a large hiatal hernia referred for repair  History of Present Illness:  Alexandria [name] is an 63 y.o. female referred initially to Dr. Marlou Starks and then to me regarding a large hiatal hernia that she has felt to possibly be contributing to her shortness of breath, lassitude and exercise intolerance. She had seen some one in [place] regarding her hiatal hernia prior to a diagnosis of right-sided breast cancer that was treated with a lumpectomy and [place] followed by chemotherapy and [place] and then radiation down here after her hand husband was transferred to this region. There is some question of a recent CT scan that she has some adenopathy in her mediastinum as well as a small lymph node in her left groin area.  Her past medical history is remarkable that she had a history of a pulmonary embolus during a C-section and she's had 4 cycles of Taxotere and Cytoxan.  After her onset of her chemotherapy she required edema of her lower extremities felt to be lymphedema.  Past Medical History  Diagnosis Date  . Iron deficiency anemia   . Status post chemotherapy     4 cycles of Taxotere and cytoxan  . Chronic back pain   . Hx of pulmonary embolus     During c-Section  . Neuropathy   . Obesity     Class 2  . S/P radiation therapy  03/04/2013-04/17/2013    1) Right breast / 50 Gy in 25 fractions/ 2) Right breast boost / 10 Gy in 5 fractions  . Diastolic heart failure   . Hx of radiation therapy 03/04/13- 04/17/13    right breast 50 Gy 25 fractions, right breast boost 10 Gy 5 fractions  . 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 Cesarean section    . Right breast lumpectomy      Current Outpatient Prescriptions  Medication Sig Dispense Refill  . albuterol (PROAIR HFA) 108 (90 BASE) MCG/ACT inhaler  Inhale 2 puffs into the lungs every 4 (four) hours as needed.  18 g  5  . anastrozole (ARIMIDEX) 1 MG tablet Take 1 mg by mouth daily.      . budesonide (PULMICORT) 180 MCG/ACT inhaler Inhale 2 puffs into the lungs 2 (two) times daily.  1 Inhaler  6  . escitalopram (LEXAPRO) 20 MG tablet Take 1 tablet (20 mg total) by mouth daily.  90 tablet  3  . ferrous sulfate 325 (65 FE) MG tablet Take 325 mg by mouth daily with breakfast.      . furosemide (LASIX) 80 MG tablet Take 80 mg by mouth 2 (two) times daily. Increase to three times daily if gain more 2 lbs in a day.      Marland Kitchen HYDROmorphone (DILAUDID) 4 MG tablet Take 4 mg by mouth 3 (three) times daily as needed (pain).       Marland Kitchen HYDROmorphone HCl (EXALGO) 12 MG T24A SR tablet Take 12 mg by mouth 2 (two) times daily.      Marland Kitchen levalbuterol (XOPENEX) 0.63 MG/3ML nebulizer solution Take 3 mLs (0.63 mg total) by nebulization every 4 (four) hours as needed for wheezing or shortness of breath.  3 mL  12  . lidocaine (LIDODERM) 5 % Place 1-3 patches onto the skin daily. Remove & Discard patch within 12  hours or as directed by MD      . lidocaine-prilocaine (EMLA) cream Apply 1 application topically as needed (port access).       . LORazepam (ATIVAN) 0.5 MG tablet Take 1 tablet (0.5 mg total) by mouth 2 (two) times daily as needed for anxiety.  60 tablet  1  . lubiprostone (AMITIZA) 24 MCG capsule Take 1 capsule (24 mcg total) by mouth 2 (two) times daily with a meal.  60 capsule  2  . Multiple Vitamins-Minerals (HAIR/SKIN/NAILS PO) Take 1 tablet by mouth every morning.      . pantoprazole (PROTONIX) 40 MG tablet Take 1 tablet (40 mg total) by mouth daily.  30 tablet  0  . potassium chloride SA (K-DUR,KLOR-CON) 20 MEQ tablet Take 20-40 mEq by mouth 3 (three) times daily.       No current facility-administered medications for this visit.   Contrast media; Albuterol; and Prednisone Family History  Problem Relation Age of Onset  . Parkinson's disease Mother   .  Emphysema Maternal Grandfather   . COPD Sister    Social History:   reports that she has never smoked. She has never used smokeless tobacco. She reports that she does not drink alcohol or use illicit drugs.   REVIEW OF SYSTEMS - PERTINENT POSITIVES ONLY: Positive for history of pulmonary embolus. She is positive for shortness of breath. She is able to swallow without obstruction.  Physical Exam:   Blood pressure 164/92, pulse 80, temperature 97.6 F (36.4 C), temperature source Temporal, resp. rate 18, height 5' 7"  (1.702 m), weight 235 lb 3.2 oz (106.686 kg), last menstrual period 01/30/2005. Body mass index is 36.83 kg/(m^2).  Gen:  WDWN white female NAD  Neurological: Alert and oriented to person, place, and time. Motor and sensory function is grossly intact  Head: Normocephalic and atraumatic.  Eyes: Conjunctivae are normal. Pupils are equal, round, and reactive to light. No scleral icterus.  Neck: Normal range of motion. Neck supple. No tracheal deviation or thyromegaly present.  Cardiovascular:  SR with S3 Respiratory:  No wheezes, rales or rhonchi.  Abdomen:  Obese with some evidence of a lower midline hernia. GU: Musculoskeletal: Normal range of motion. Extremities are nontender. bilateral lower extremity edema Lymphadenopathy: No cervical, preauricular, postauricular or axillary adenopathy is present Skin: Skin is warm and dry. No rash noted. No diaphoresis. No erythema. No pallor. Pscyh: Normal mood and affect. Behavior is normal. Judgment and thought content normal.   LABORATORY RESULTS: No results found for this or any previous visit (from the past 48 hour(s)).  RADIOLOGY RESULTS: No results found.  Problem List: Patient Active Problem List   Diagnosis Date Noted  . Mediastinal lymphadenopathy 01/29/2014  . DOE (dyspnea on exertion) 01/29/2014  . Ventral hernia 01/23/2014  . Hiatal hernia 01/23/2014  . Symptomatic anemia 11/19/2013  . Hx of radiation therapy   .  Protein-calorie malnutrition, severe 04/19/2013  . SVT (supraventricular tachycardia) 04/19/2013  . Anasarca 04/15/2013  . Vocal cord dysfunction 04/15/2013  . Acute on chronic diastolic CHF (congestive heart failure) 04/14/2013  . Anxiety state, unspecified 04/14/2013  . Hypokalemia 04/13/2013  . AKI (acute kidney injury) 04/13/2013  . Acute-on-chronic respiratory failure 04/13/2013  . Anemia 04/13/2013  . Hypertension 04/08/2013  . Chronic back pain 04/08/2013  . Morbid obesity 04/08/2013  . Adjustment disorder with mixed anxiety and depressed mood 04/08/2013  . Lower extremity edema 04/08/2013  . Systolic murmur 20/94/7096  . Chronic constipation 04/08/2013  . Malignant neoplasm of  upper-outer quadrant of female breast 02/21/2013    Assessment & Plan: I reviewed the CT scan and she does have significant part of her stomach up in her chest. She does however have more exercise intolerance in the onset of bilateral lower tissue and edema during her chemotherapy makes the wonder whether she's had some cardiac damage related to the chemotherapy. On my exam she does have an S3. I would like for her to have a cardiac evaluation probably including an echocardiogram to look at her wall motion and see if there is a drug with perhaps a inotropic effect to improve her cardiac function. We will refer her for cardiac evaluation and then I'll see her back in a few weeks to continue discussion about laparoscopic repair of her hiatal hernia and ventral hernia.    Matt B. Hassell Done, MD, [facility] [phone] beeper [phone]  02/13/2014 2:10 PM

## 2014-02-20 ENCOUNTER — Telehealth: Payer: Self-pay | Admitting: Cardiovascular Disease

## 2014-02-20 NOTE — Telephone Encounter (Signed)
Closed Encounter  °

## 2014-02-26 ENCOUNTER — Encounter: Payer: Self-pay | Admitting: Cardiovascular Disease

## 2014-02-26 ENCOUNTER — Ambulatory Visit (INDEPENDENT_AMBULATORY_CARE_PROVIDER_SITE_OTHER): Payer: Medicare Other | Admitting: Cardiovascular Disease

## 2014-02-26 VITALS — BP 142/90 | HR 85 | Ht 67.0 in | Wt 231.4 lb

## 2014-02-26 DIAGNOSIS — R0989 Other specified symptoms and signs involving the circulatory and respiratory systems: Secondary | ICD-10-CM

## 2014-02-26 DIAGNOSIS — R0602 Shortness of breath: Secondary | ICD-10-CM | POA: Diagnosis not present

## 2014-02-26 DIAGNOSIS — R0609 Other forms of dyspnea: Secondary | ICD-10-CM

## 2014-02-26 NOTE — Progress Notes (Signed)
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 She was referred to me by Alexandria [name] and Alexandria [name] for cardiovascular clearance prior to elective hiatal hernia repair. She has no cardiovascular risk factors. She has had breast cancer last year and had chemotherapy and radiation therapy as well as a lumpectomy. She is given 120 pounds in the last year. She also has bilateral lower extremity edema left greater than right probably lymphedema. She's had a 2-D echo a year ago that showed normal systolic function with diastolic dysfunction.   Current Outpatient Prescriptions  Medication Sig Dispense Refill  . albuterol (PROAIR HFA) 108 (90 BASE) MCG/ACT inhaler Inhale 2 puffs into the lungs every 4 (four) hours as needed.  18 g  5  . anastrozole (ARIMIDEX) 1 MG tablet Take 1 mg by mouth daily.      . budesonide (PULMICORT) 180 MCG/ACT inhaler Inhale 2 puffs into the lungs 2 (two) times daily.  1 Inhaler  6  . escitalopram (LEXAPRO) 20 MG tablet Take 1 tablet (20 mg total) by mouth daily.  90 tablet  3  . ferrous sulfate 325 (65 FE) MG tablet Take 325 mg by mouth daily with breakfast.      . furosemide (LASIX) 80 MG tablet Take 80 mg by mouth 2 (two) times daily. Increase to three times daily if gain more 2 lbs in a day.      Marland Kitchen HYDROmorphone (DILAUDID) 4 MG tablet Take 4 mg by mouth 3 (three) times daily as needed (pain).       Marland Kitchen HYDROmorphone HCl (EXALGO) 12 MG T24A SR tablet Take 12 mg by mouth 2 (two) times daily.      Marland Kitchen levalbuterol (XOPENEX) 0.63 MG/3ML nebulizer solution Take 3 mLs (0.63 mg total) by nebulization every 4 (four) hours as needed for wheezing or shortness of breath.  3 mL  12   . lidocaine (LIDODERM) 5 % Place 1-3 patches onto the skin daily. Remove & Discard patch within 12 hours or as directed by MD      . lidocaine-prilocaine (EMLA) cream Apply 1 application topically as needed (port access).       . LORazepam (ATIVAN) 0.5 MG tablet Take 1 tablet (0.5 mg total) by mouth 2 (two) times daily as needed for anxiety.  60 tablet  1  . lubiprostone (AMITIZA) 24 MCG capsule Take 1 capsule (24 mcg total) by mouth 2 (two) times daily with a meal.  60 capsule  2  . Multiple Vitamins-Minerals (HAIR/SKIN/NAILS PO) Take 1 tablet by mouth every morning.      . pantoprazole (PROTONIX) 40 MG tablet Take 1 tablet (40 mg total) by mouth daily.  30 tablet  0  . potassium chloride SA (K-DUR,KLOR-CON) 20 MEQ tablet Take 20-40 mEq by mouth 3 (three) times daily.       No current facility-administered medications for this visit.    Allergies  Allergen Reactions  . Contrast Media [Iodinated Diagnostic Agents] Anaphylaxis  . Albuterol Other (See Comments)    Anxious   . Prednisone Other (See Comments)    Pt gets very agitated when she takes high doses of steroids    History   Social History  . Marital Status: Married  Spouse Name: N/A    Number of Children: N/A  . Years of Education: N/A   Occupational History  . retired Therapist, sports    Social History Main Topics  . Smoking status: Never Smoker   . Smokeless tobacco: Never Used  . Alcohol Use: No  . Drug Use: No  . Sexual Activity: Yes   Other Topics Concern  . Not on file   Social History Narrative  . No narrative on file     Review of Systems: General: negative for chills, fever, night sweats or weight changes.  Cardiovascular: negative for chest pain, dyspnea on exertion, edema, orthopnea, palpitations, paroxysmal nocturnal dyspnea or shortness of breath Dermatological: negative for rash Respiratory: negative for cough or wheezing Urologic: negative for hematuria Abdominal: negative for nausea, vomiting, diarrhea,  bright red blood per rectum, melena, or hematemesis Neurologic: negative for visual changes, syncope, or dizziness All other systems reviewed and are otherwise negative except as noted above.    Blood pressure 142/90, pulse 85, height 5\' 7"  (1.702 m), weight 231 lb 6.4 oz (104.962 kg), last menstrual period 01/30/2005.  General appearance: alert and no distress Neck: no adenopathy, no carotid bruit, no JVD, supple, symmetrical, trachea midline and thyroid not enlarged, symmetric, no tenderness/mass/nodules Lungs: clear to auscultation bilaterally Heart: regular rate and rhythm, S1, S2 normal, no murmur, click, rub or gallop Extremities: 3+ nonpitting edema  EKG normal sinus rhythm at 85 without ST or T wave changes  ASSESSMENT AND PLAN:   DOE (dyspnea on exertion) Patient was sent to me for preoperative clearance before I will hernia surgery by Alexandria [name]. She is also seeing Alexandria [name], pulmonology, for this as well.she really has no cardiac risk factors. She says her shortness of breath has gotten worse a the last year or so. She is a large hiatal hernia. There's also a history of remote pulmonary embolus as well as diastolic dysfunction with normal LV function one year ago. She has no cardiac risk factors and therefore I do not feel she needs a stress test but I am going to repeat a 2-D echocardiogram. She also has severe lower shimmy edema which I suspect is lymphedema. She has had breast cancer in the past and had chemotherapy and radiation therapy. She is in chronic oral diuretic therapy.      Alexandria Harp MD FACP,FACC,FAHA, [facility] 02/26/2014 4:50 PM

## 2014-02-26 NOTE — Patient Instructions (Signed)
Echocardiogram. Echocardiography is a painless test that uses sound waves to create images of your heart. It provides your doctor with information about the size and shape of your heart and how well your heart's chambers and valves are working. This procedure takes approximately one hour. There are no restrictions for this procedure.   Follow up with Dr Gwenlyn Found as needed.

## 2014-02-26 NOTE — Assessment & Plan Note (Signed)
Patient was sent to me for preoperative clearance before I will hernia surgery by Dr. Kaylyn Lim. She is also seeing Dr. Gwenette Greet, pulmonology, for this as well.she really has no cardiac risk factors. She says her shortness of breath has gotten worse a the last year or so. She is a large hiatal hernia. There's also a history of remote pulmonary embolus as well as diastolic dysfunction with normal LV function one year ago. She has no cardiac risk factors and therefore I do not feel she needs a stress test but I am going to repeat a 2-D echocardiogram. She also has severe lower shimmy edema which I suspect is lymphedema. She has had breast cancer in the past and had chemotherapy and radiation therapy. She is in chronic oral diuretic therapy.

## 2014-03-02 DIAGNOSIS — Z79899 Other long term (current) drug therapy: Secondary | ICD-10-CM | POA: Diagnosis not present

## 2014-03-02 DIAGNOSIS — G894 Chronic pain syndrome: Secondary | ICD-10-CM | POA: Diagnosis not present

## 2014-03-02 DIAGNOSIS — M5137 Other intervertebral disc degeneration, lumbosacral region: Secondary | ICD-10-CM | POA: Diagnosis not present

## 2014-03-02 DIAGNOSIS — M1990 Unspecified osteoarthritis, unspecified site: Secondary | ICD-10-CM | POA: Diagnosis not present

## 2014-03-09 ENCOUNTER — Ambulatory Visit (HOSPITAL_COMMUNITY)
Admission: RE | Admit: 2014-03-09 | Discharge: 2014-03-09 | Disposition: A | Discharge status: Home / Self Care | Payer: Medicare Other | Source: Ambulatory Visit | Attending: Cardiovascular Disease | Admitting: Cardiovascular Disease

## 2014-03-09 DIAGNOSIS — R0602 Shortness of breath: Secondary | ICD-10-CM | POA: Diagnosis not present

## 2014-03-09 DIAGNOSIS — I519 Heart disease, unspecified: Secondary | ICD-10-CM

## 2014-03-09 NOTE — Progress Notes (Signed)
2D Echo Performed 03/09/2014    Alexandria [name], RCS. Pt. States since las OV increasing dizziness and SOB dont know weather its the heat  or weighed gain. After Echo pt. Felt better no longer dizzy or sob.

## 2014-03-10 ENCOUNTER — Telehealth: Payer: Self-pay | Admitting: Cardiovascular Disease

## 2014-03-10 NOTE — Telephone Encounter (Signed)
Patient notified of echo results. Clearance will be faxed to Dr. Rockne Coons

## 2014-03-10 NOTE — Telephone Encounter (Signed)
Clearance letter faxed 

## 2014-03-10 NOTE — Telephone Encounter (Signed)
Dr Berry, please advise 

## 2014-03-10 NOTE — Telephone Encounter (Signed)
Normal LV function. Patient cleared for her upcoming hiatal hernia surgical procedure.

## 2014-03-10 NOTE — Telephone Encounter (Signed)
Deferred to Dr. Cecil Cobbs, RN to advise - test in EPIC, not resulted on yet. Patient needs this for surg clearance.Marland Kitchen

## 2014-03-10 NOTE — Telephone Encounter (Signed)
Pt wants to know if her echo results are back from yesterday,said she was told that she would get them yesterday or today..Pt is waiting to be cleared for surgery.

## 2014-03-11 ENCOUNTER — Other Ambulatory Visit: Payer: Self-pay

## 2014-03-11 ENCOUNTER — Ambulatory Visit: Payer: Self-pay | Admitting: Adult Health

## 2014-03-12 ENCOUNTER — Telehealth (INDEPENDENT_AMBULATORY_CARE_PROVIDER_SITE_OTHER): Payer: Self-pay

## 2014-03-12 ENCOUNTER — Encounter: Payer: Self-pay | Admitting: *Deleted

## 2014-03-12 NOTE — Telephone Encounter (Signed)
Advised pt of Christy's message below. Pt verbalized understanding.

## 2014-03-12 NOTE — Telephone Encounter (Signed)
Message copied by Ivor Costa on Thu Mar 12, 2014 11:46 AM ------      Message from: Humphrey Rolls K      Created: Wed Mar 11, 2014 10:16 AM      Regarding: Dr Hassell Done      Contact: [phone]       Wants to know if cardio clearance was received and if she can go ahead and schedule sx without coming into the office on 03/17/14 ------

## 2014-03-12 NOTE — Telephone Encounter (Signed)
Called and left message for patient to call our office RE:  Please make patient aware that Dr. Hassell Done wants to see patient prior to surgery.  Patient has pre-op appointment scheduled fpr 03/17/14 @ 3:10 pm with Dr. Hassell Done.  Patient needs to keep this appointment.

## 2014-03-13 ENCOUNTER — Telehealth (INDEPENDENT_AMBULATORY_CARE_PROVIDER_SITE_OTHER): Payer: Self-pay

## 2014-03-13 NOTE — Telephone Encounter (Signed)
Pts husband is calling wanting to know if Dr Hassell Done could write orders for pt to have HomeHealth. Pts husband states that he will be out of town and he will need some help taking care of her, while he is out. He states that he is spoke with [facility], they are in his insurance network. If Dr Hassell Done agrees to [place] fax orders to [facility] [phone]. Informed pt that I would send request to Dr Hassell Done and his assistant. Pt verbalized understanding.

## 2014-03-16 ENCOUNTER — Other Ambulatory Visit: Payer: Self-pay

## 2014-03-17 ENCOUNTER — Encounter (INDEPENDENT_AMBULATORY_CARE_PROVIDER_SITE_OTHER): Payer: Self-pay | Admitting: Surgery

## 2014-03-17 ENCOUNTER — Encounter (INDEPENDENT_AMBULATORY_CARE_PROVIDER_SITE_OTHER): Payer: Medicare Other | Admitting: Surgery

## 2014-03-17 ENCOUNTER — Ambulatory Visit (INDEPENDENT_AMBULATORY_CARE_PROVIDER_SITE_OTHER): Payer: Medicare Other | Admitting: Surgery

## 2014-03-17 DIAGNOSIS — I498 Other specified cardiac arrhythmias: Secondary | ICD-10-CM | POA: Diagnosis not present

## 2014-03-17 DIAGNOSIS — I471 Supraventricular tachycardia: Secondary | ICD-10-CM

## 2014-03-17 DIAGNOSIS — K449 Diaphragmatic hernia without obstruction or gangrene: Secondary | ICD-10-CM

## 2014-03-17 DIAGNOSIS — I10 Essential (primary) hypertension: Secondary | ICD-10-CM

## 2014-03-17 NOTE — Progress Notes (Signed)
Chief Complaint:  Morbid obesity and hiatal hernia; ventral hernia  History of Present Illness:  Alexandria [name] is an 63 y.o. female who has recently received cardiac clearance to under go surgery.  Brought up a desire to lose weight with bariatric surgery. We discussed a sleeve gastrectomy and a checked with [name] and she has coverage.  Would consider sleeve gastrectomy and repair of hiatus hernia concomittantly.  She would like to proceed with this.  Her weight is 231 and her height is 5' 6" .  BMI is 39.    Past Medical History  Diagnosis Date  . Iron deficiency anemia   . Status post chemotherapy     4 cycles of Taxotere and cytoxan  . Chronic back pain   . Hx of pulmonary embolus     During c-Section  . Neuropathy   . Obesity     Class 2  . S/P radiation therapy  03/04/2013-04/17/2013    1) Right breast / 50 Gy in 25 fractions/ 2) Right breast boost / 10 Gy in 5 fractions  . Diastolic heart failure   . Hx of radiation therapy 03/04/13- 04/17/13    right breast 50 Gy 25 fractions, right breast boost 10 Gy 5 fractions  . 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 Dyspnea on exertion     Past Surgical History  Procedure Laterality Date  . Cesarean section    . Right breast lumpectomy      Current Outpatient Prescriptions  Medication Sig Dispense Refill  . albuterol (PROAIR HFA) 108 (90 BASE) MCG/ACT inhaler Inhale 2 puffs into the lungs every 4 (four) hours as needed.  18 g  5  . anastrozole (ARIMIDEX) 1 MG tablet Take 1 mg by mouth daily.      . budesonide (PULMICORT) 180 MCG/ACT inhaler Inhale 2 puffs into the lungs 2 (two) times daily.  1 Inhaler  6  . escitalopram (LEXAPRO) 20 MG tablet Take 1 tablet (20 mg total) by mouth daily.  90 tablet  3  . ferrous sulfate 325 (65 FE) MG tablet Take 325 mg by mouth daily with breakfast.      . furosemide (LASIX) 80 MG tablet Take 80  mg by mouth 2 (two) times daily. Increase to three times daily if gain more 2 lbs in a day.      Marland Kitchen HYDROmorphone (DILAUDID) 4 MG tablet Take 4 mg by mouth 3 (three) times daily as needed (pain).       Marland Kitchen HYDROmorphone HCl (EXALGO) 12 MG T24A SR tablet Take 12 mg by mouth 2 (two) times daily.      Marland Kitchen levalbuterol (XOPENEX) 0.63 MG/3ML nebulizer solution Take 3 mLs (0.63 mg total) by nebulization every 4 (four) hours as needed for wheezing or shortness of breath.  3 mL  12  . lidocaine (LIDODERM) 5 % Place 1-3 patches onto the skin daily. Remove & Discard patch within 12 hours or as directed by MD      . lidocaine-prilocaine (EMLA) cream Apply 1 application topically as needed (port access).       . LORazepam (ATIVAN) 0.5 MG tablet Take 1 tablet (0.5 mg total) by mouth 2 (two) times daily as needed for anxiety.  60 tablet  1  . lubiprostone (AMITIZA) 24 MCG capsule Take 1 capsule (24 mcg total) by mouth 2 (two) times daily with a meal.  60 capsule  2  . Multiple Vitamins-Minerals (HAIR/SKIN/NAILS PO) Take 1 tablet by mouth every morning.      . pantoprazole (PROTONIX) 40 MG tablet Take 1 tablet (40 mg total) by mouth daily.  30 tablet  0  . potassium chloride SA (K-DUR,KLOR-CON) 20 MEQ tablet Take 20-40 mEq by mouth 3 (three) times daily.       No current facility-administered medications for this visit.   Contrast media; Albuterol; and Prednisone Family History  Problem Relation Age of Onset  . Parkinson's disease Mother   . Emphysema Maternal Grandfather   . COPD Sister    Social History:   reports that she has never smoked. She has never used smokeless tobacco. She reports that she does not drink alcohol or use illicit drugs.   REVIEW OF SYSTEMS : Positive for see problem list ; otherwise negative  Physical Exam:   Blood pressure 142/86, pulse 71, temperature 97.4 F (36.3 C), temperature source Temporal, resp. rate 18, weight 0 lb (0 kg), last menstrual period 01/30/2005. Body mass  index is 0.00 kg/(m^2).  Gen:  WDWN WF NAD  Neurological: Alert and oriented to person, place, and time. Motor and sensory function is grossly intact  Head: Normocephalic and atraumatic.  Eyes: Conjunctivae are normal. Pupils are equal, round, and reactive to light. No scleral icterus.  Neck: Normal range of motion. Neck supple. No tracheal deviation or thyromegaly present.  Cardiovascular:  SR without murmurs or gallops.  No carotid bruits Breast:  Right breast cancer Respiratory: Effort normal.  No respiratory distress. No chest wall tenderness. Breath sounds normal.  No wheezes, rales or rhonchi.  Abdomen:  Ventral hernia from prior right hemicolectomy GU:  Not examined Musculoskeletal: Normal range of motion. Extremities are nontender. No cyanosis, edema or clubbing noted Lymphadenopathy: No cervical, preauricular, postauricular or axillary adenopathy is present Skin: Skin is warm and dry. No rash noted. No diaphoresis. No erythema. No pallor. Pscyh: Normal mood and affect. Behavior is normal. Judgment and thought content normal.   LABORATORY RESULTS: No results found for this or any previous visit (from the past 48 hour(s)).   RADIOLOGY RESULTS: No results found.  Problem List: Patient Active Problem List   Diagnosis Date Noted  . Mediastinal lymphadenopathy 01/29/2014  . DOE (dyspnea on exertion) 01/29/2014  . Ventral hernia 01/23/2014  . Hiatal hernia 01/23/2014  . Symptomatic anemia 11/19/2013  . Hx of radiation therapy   . Protein-calorie malnutrition, severe 04/19/2013  . SVT (supraventricular tachycardia) 04/19/2013  . Anasarca 04/15/2013  . Vocal cord dysfunction 04/15/2013  . Acute on chronic diastolic CHF (congestive heart failure) 04/14/2013  . Anxiety state, unspecified 04/14/2013  . Hypokalemia 04/13/2013  . AKI (acute kidney injury) 04/13/2013  . Acute-on-chronic respiratory failure 04/13/2013  . Anemia 04/13/2013  . Hypertension 04/08/2013  . Chronic back  pain 04/08/2013  . Morbid obesity 04/08/2013  . Adjustment disorder with mixed anxiety and depressed mood 04/08/2013  . Lower extremity edema 04/08/2013  . Systolic murmur 90/21/1155  . Chronic constipation 04/08/2013  . Malignant neoplasm of upper-outer quadrant of female breast 02/21/2013    Assessment & Plan: Will work toward repair of hiatus hernia with sleeve gastrectomy and ventral hernia repair.      Matt B. Hassell Done, MD, [facility] [phone] beeper [phone]  03/17/2014 3:57 PM

## 2014-03-17 NOTE — Addendum Note (Signed)
Addended by: Ivor Costa on: 03/17/2014 04:19 PM   Modules accepted: Orders

## 2014-03-17 NOTE — Patient Instructions (Signed)
Sleeve Gastrectomy A sleeve gastrectomy is a surgery in which a large portion of the stomach is removed. After the surgery, the stomach will be a narrow tube about the size of a banana. This surgery is performed to help a person lose weight. The person loses weight because the reduced size of the stomach restricts the amount of food that the person can eat. The stomach will hold much less food than before the surgery. Also, the part of the stomach that is removed produces a hormone that causes hunger.  This surgery is done for people who have morbid obesity, defined as a body mass index (BMI) greater than 40. BMI is an estimate of body fat and is calculated from the height and weight of a person. This surgery may also be done for people with a BMI between 35 and 40 if they have other diseases, such as type 2 diabetes mellitus, obstructive sleep apnea, or heart and lung disorders (cardiopulmonary diseases).  LET YOUR HEALTH CARE [name] KNOW ABOUT:  Any allergies you have.   All medicines you are taking, including vitamins, herbs, eyedrops, creams, and over-the-counter medicines.   Use of steroids (by mouth or creams).   Previous problems you or members of your family have had with the use of anesthetics.   Any blood disorders you have.   Previous surgeries you have had.   Possibility of pregnancy, if this applies.   Other health problems you have. RISKS AND COMPLICATIONS Generally, sleeve gastrectomy is a safe procedure. However, as with any procedure, complications can occur. Possible complications include:  Infection.  Bleeding.  Blood clots.  Damage to other organs or tissue.  Leakage of fluid from the stomach into the abdominal cavity (rare). BEFORE THE PROCEDURE  You may need to have blood tests and imaging tests (such as X-rays or ultrasonography) done before the day of surgery. A test to evaluate your esophagus and how it moves (esophageal manometry) may also be  done.  You may be placed on a liquid diet 2-3 weeks before the surgery.  Ask your health care Oscar Forman about changing or stopping your regular medicines.  Do not eat or drink anything for at least 8 hours before the procedure.   Make plans to have someone drive you home after your hospital stay. Also arrange for someone to help you with activities during recovery. PROCEDURE  A laparoscopic technique is usually used for this surgery:  You will be given medicine to make you sleep through the procedure (general anesthetic). This medicine will be given through an intravenous (IV) access tube that is put into one of your veins.  Once you are asleep, your abdomen will be cleaned and sterilized.  Several small incisions will be made in your abdomen.  Your abdomen will be filled with air so that it expands. This gives the surgeon more room to operate and makes your organs easier to see.  A thin, lighted tube with a tiny camera on the end (laparoscope) is put through a small incision in your abdomen. The camera on the laparoscope sends a picture to a TV screen in the operating room. This gives the surgeon a good view inside the abdomen.  Hollow tubes are put through the other small incisions in your abdomen. The tools needed for the procedure are put through these tubes.  The surgeon uses staples to divide part of the stomach and then removes it through one of the incisions.  The remaining stomach may be reinforced using stitches   or surgical glue or both to prevent leakage of the stomach contents. A small tube (drain) may be placed through one of the incisions to allow extra fluid to flow from the area.  The incisions are closed with stitches, staples, or glue. AFTER THE PROCEDURE  You will be monitored closely in a recovery area. Once the anesthetic has worn off, you will likely be moved to a regular hospital room.  You will be given medicine for pain and nausea.   You may have a drain  from one of the incisions in your abdomen. If a drain is used, it may stay in place after you go home from the hospital and be removed at a follow-up appointment.   You will be encouraged to walk around several times a day. This helps prevent blood clots.  You will be started on a liquid diet the first day after your surgery. Sometimes a test is done to check for leaking before you can eat.  You will be urged to cough and do deep breathing exercises. This helps prevent a lung infection after a surgery.  You will likely need to stay in the hospital for a few days.  Document Released: 07/02/2009 Document Revised: 05/07/2013 Document Reviewed: 01/17/2013 ExitCare Patient Information 2015 [employer]. This information is not intended to replace advice given to you by your health care Kamaiyah Uselton. Make sure you discuss any questions you have with your health care Hercules Hasler.  

## 2014-03-18 ENCOUNTER — Encounter: Payer: Medicare Other | Attending: Surgery | Admitting: Dietician

## 2014-03-18 ENCOUNTER — Encounter: Payer: Self-pay | Admitting: Dietician

## 2014-03-18 DIAGNOSIS — Z01818 Encounter for other preprocedural examination: Secondary | ICD-10-CM | POA: Insufficient documentation

## 2014-03-18 DIAGNOSIS — Z6837 Body mass index (BMI) 37.0-37.9, adult: Secondary | ICD-10-CM | POA: Insufficient documentation

## 2014-03-18 DIAGNOSIS — Z713 Dietary counseling and surveillance: Secondary | ICD-10-CM | POA: Diagnosis not present

## 2014-03-18 NOTE — Progress Notes (Signed)
  Pre-Op Assessment Visit:  Pre-Operative Sleeve Gastrectomy Surgery  Medical Nutrition Therapy:  Appt start time: 1700   End time:  1800.  Patient was seen on 03/18/2014 for Pre-Operative Sleeve Gastrectomy Nutrition Assessment. Assessment and letter of approval faxed to [facility] Program coordinator on 03/18/2014.   Preferred Learning Style:   No preference indicated   Learning Readiness:   Ready  Handouts given during visit include:  Pre-Op Goals Bariatric Surgery Protein Shakes  Teaching Method Utilized:  Visual Auditory Hands on  Barriers to learning/adherence to lifestyle change: skips a lot of meals, drinks most of her calories, multiple health problems  Demonstrated degree of understanding via:  Teach Back   Patient to call the [facility] to enroll in Pre-Op and Post-Op Nutrition Education when surgery date is scheduled.

## 2014-03-18 NOTE — Patient Instructions (Signed)
Start trying Protein Shakes. Work on [employer]. Call [facility] when your surgery is scheduled to enroll in Pre Op Class.

## 2014-03-19 ENCOUNTER — Telehealth: Payer: Self-pay | Admitting: Internal Medicine

## 2014-03-19 DIAGNOSIS — J9620 Acute and chronic respiratory failure, unspecified whether with hypoxia or hypercapnia: Secondary | ICD-10-CM

## 2014-03-19 DIAGNOSIS — G8929 Other chronic pain: Secondary | ICD-10-CM

## 2014-03-19 DIAGNOSIS — I10 Essential (primary) hypertension: Secondary | ICD-10-CM

## 2014-03-19 DIAGNOSIS — M549 Dorsalgia, unspecified: Secondary | ICD-10-CM

## 2014-03-19 DIAGNOSIS — R0609 Other forms of dyspnea: Principal | ICD-10-CM

## 2014-03-19 NOTE — Telephone Encounter (Signed)
Please advise if okay for Home Health Evaluation?

## 2014-03-19 NOTE — Telephone Encounter (Signed)
Pt has [employer]/medicare ins and is requesting home health evaluation. Pt is will be her hernia surgery this month and needs some assistant ADLS ect. Pt has hx of sob. Pt GS dr Hassell Done told her to get request form dr Raliegh Ip. Please fax order to [facility] [phone] phone #[phone]

## 2014-03-19 NOTE — Telephone Encounter (Signed)
ok 

## 2014-03-23 NOTE — Telephone Encounter (Signed)
Left message on voicemail to call office.  

## 2014-03-24 NOTE — Telephone Encounter (Signed)
Left detailed message order for Home Health referral done and someone should be contacting you.

## 2014-03-27 ENCOUNTER — Ambulatory Visit: Admission: RE | Admit: 2014-03-27 | Payer: Self-pay | Source: Ambulatory Visit | Admitting: Radiation Oncology

## 2014-03-27 ENCOUNTER — Telehealth: Payer: Self-pay | Admitting: Internal Medicine

## 2014-03-27 ENCOUNTER — Encounter (INDEPENDENT_AMBULATORY_CARE_PROVIDER_SITE_OTHER): Payer: Medicare Other | Admitting: Surgery

## 2014-03-27 NOTE — Telephone Encounter (Signed)
Ivin Booty from [facility] called to say that she need clarifications on home health orders. Phone number [phone] she need a call back today

## 2014-03-27 NOTE — Telephone Encounter (Signed)
Called Alexandria [name] and left message to call office.

## 2014-03-27 NOTE — Telephone Encounter (Signed)
Ivin Booty called back, said agency has been having trouble getting in touch with pt and when they did she said they needed to come today. Marlise Eves they do not need to go today if can't pt is not even scheduled for surgery yet from what I can see in the chart. Ivin Booty verbalized understanding and stated okay it will be [facility] agency seeing pt and it will be on Monday. Told her that is fine.

## 2014-03-30 DIAGNOSIS — Z79899 Other long term (current) drug therapy: Secondary | ICD-10-CM | POA: Diagnosis not present

## 2014-03-30 DIAGNOSIS — M5380 Other specified dorsopathies, site unspecified: Secondary | ICD-10-CM | POA: Diagnosis not present

## 2014-03-30 DIAGNOSIS — G894 Chronic pain syndrome: Secondary | ICD-10-CM | POA: Diagnosis not present

## 2014-03-30 DIAGNOSIS — M5137 Other intervertebral disc degeneration, lumbosacral region: Secondary | ICD-10-CM | POA: Diagnosis not present

## 2014-04-02 ENCOUNTER — Other Ambulatory Visit: Payer: Self-pay | Admitting: Internal Medicine

## 2014-04-03 ENCOUNTER — Ambulatory Visit (INDEPENDENT_AMBULATORY_CARE_PROVIDER_SITE_OTHER): Payer: Medicare Other | Admitting: Internal Medicine

## 2014-04-03 ENCOUNTER — Other Ambulatory Visit: Payer: Self-pay | Admitting: *Deleted

## 2014-04-03 ENCOUNTER — Encounter: Payer: Self-pay | Admitting: Internal Medicine

## 2014-04-03 DIAGNOSIS — K449 Diaphragmatic hernia without obstruction or gangrene: Secondary | ICD-10-CM

## 2014-04-03 DIAGNOSIS — I10 Essential (primary) hypertension: Secondary | ICD-10-CM

## 2014-04-03 MED ORDER — LUBIPROSTONE 24 MCG PO CAPS: 24.0000 ug | ORAL_CAPSULE | Freq: Two times a day (BID) | ORAL | Status: DC

## 2014-04-03 NOTE — Progress Notes (Signed)
Pre visit review using our clinic review tool, if applicable. No additional management support is needed unless otherwise documented below in the visit note. 

## 2014-04-03 NOTE — Patient Instructions (Signed)
Limit your sodium (Salt) intake  You need to lose weight.  Consider a lower calorie diet and regular exercise.  Return in one month for follow-up

## 2014-04-03 NOTE — Progress Notes (Signed)
Subjective:    Patient ID: Alexandria [name], female    DOB: 1950-10-11, 63 y.o.   MRN: [id number]  HPI 63 year old patient who has a history of treated hypertension.  She has a history of morbid obesity and chronic lower extremity edema.  She has been seen by cardiology and is followed for cardiac dysrhythmia, as well as chronic diastolic heart failure.  She has seen general surgery recently and is contemplating bariatric surgery with sleeve gastrectomy with repair of a hiatal hernia. Medical comorbidities include hypertension, heart failure, history of respiratory failure and chronic lower extremity edema  Past Medical History  Diagnosis Date  . Iron deficiency anemia   . Status post chemotherapy     4 cycles of Taxotere and cytoxan  . Chronic back pain   . Hx of pulmonary embolus     During c-Section  . Neuropathy   . Obesity     Class 2  . S/P radiation therapy  03/04/2013-04/17/2013    1) Right breast / 50 Gy in 25 fractions/ 2) Right breast boost / 10 Gy in 5 fractions  . Diastolic heart failure   . Hx of radiation therapy 03/04/13- 04/17/13    right breast 50 Gy 25 fractions, right breast boost 10 Gy 5 fractions  . 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 Dyspnea on exertion     History   Social History  . Marital Status: Married    Spouse Name: N/A    Number of Children: N/A  . Years of Education: N/A   Occupational History  . retired Therapist, sports    Social History Main Topics  . Smoking status: Never Smoker   . Smokeless tobacco: Never Used  . Alcohol Use: No  . Drug Use: No  . Sexual Activity: Yes   Other Topics Concern  . Not on file   Social History Narrative  . No narrative on file    Past Surgical History  Procedure Laterality Date  . Cesarean section    . Right breast lumpectomy      Family History  Problem Relation Age of Onset  . Parkinson's disease  Mother   . Emphysema Maternal Grandfather   . COPD Sister     Allergies  Allergen Reactions  . Contrast Media [Iodinated Diagnostic Agents] Anaphylaxis  . Albuterol Other (See Comments)    Anxious   . Prednisone Other (See Comments)    Pt gets very agitated when she takes high doses of steroids    Current Outpatient Prescriptions on File Prior to Visit  Medication Sig Dispense Refill  . albuterol (PROAIR HFA) 108 (90 BASE) MCG/ACT inhaler Inhale 2 puffs into the lungs every 4 (four) hours as needed.  18 g  5  . anastrozole (ARIMIDEX) 1 MG tablet Take 1 mg by mouth daily.      . budesonide (PULMICORT) 180 MCG/ACT inhaler Inhale 2 puffs into the lungs 2 (two) times daily.  1 Inhaler  6  . escitalopram (LEXAPRO) 20 MG tablet Take 1 tablet (20 mg total) by mouth daily.  90 tablet  3  . ferrous sulfate 325 (65 FE) MG tablet Take 325 mg by mouth daily with breakfast.      . furosemide (LASIX) 80 MG tablet Take 80 mg by mouth 2 (two) times daily. Increase to three times daily if gain more 2 lbs in a day.      Marland Kitchen  HYDROmorphone (DILAUDID) 4 MG tablet Take 4 mg by mouth 3 (three) times daily as needed (pain).       Marland Kitchen HYDROmorphone HCl (EXALGO) 12 MG T24A SR tablet Take 12 mg by mouth 2 (two) times daily.      Marland Kitchen levalbuterol (XOPENEX) 0.63 MG/3ML nebulizer solution Take 3 mLs (0.63 mg total) by nebulization every 4 (four) hours as needed for wheezing or shortness of breath.  3 mL  12  . lidocaine (LIDODERM) 5 % Place 1-3 patches onto the skin daily. Remove & Discard patch within 12 hours or as directed by MD      . lidocaine-prilocaine (EMLA) cream Apply 1 application topically as needed (port access).       . LORazepam (ATIVAN) 0.5 MG tablet Take 1 tablet (0.5 mg total) by mouth 2 (two) times daily as needed for anxiety.  60 tablet  1  . Multiple Vitamins-Minerals (HAIR/SKIN/NAILS PO) Take 1 tablet by mouth every morning.      . pantoprazole (PROTONIX) 40 MG tablet Take 1 tablet (40 mg total) by  mouth daily.  30 tablet  0  . potassium chloride SA (K-DUR,KLOR-CON) 20 MEQ tablet TAKE 2 TABLETS BY MOUTH THREE TIMES DAILY  90 tablet  1   No current facility-administered medications on file prior to visit.    BP 150/80  Pulse 84  Temp(Src) 97.9 F (36.6 C) (Oral)  Resp 20  Ht _0  (1.676 m)  Wt 246 lb (111.585 kg)  BMI 39.72 kg/m2  SpO2 92%  LMP 01/30/2005      Review of Systems  Constitutional: Positive for activity change, fatigue and unexpected weight change.  HENT: Negative for congestion, dental problem, hearing loss, rhinorrhea, sinus pressure, sore throat and tinnitus.   Eyes: Negative for pain, discharge and visual disturbance.  Respiratory: Positive for shortness of breath. Negative for cough.   Cardiovascular: Positive for leg swelling. Negative for chest pain and palpitations.  Gastrointestinal: Negative for nausea, vomiting, abdominal pain, diarrhea, constipation, blood in stool and abdominal distention.  Genitourinary: Negative for dysuria, urgency, frequency, hematuria, flank pain, vaginal bleeding, vaginal discharge, difficulty urinating, vaginal pain and pelvic pain.  Musculoskeletal: Negative for arthralgias, gait problem and joint swelling.  Skin: Negative for rash.  Neurological: Negative for dizziness, syncope, speech difficulty, weakness, numbness and headaches.  Hematological: Negative for adenopathy.  Psychiatric/Behavioral: Negative for behavioral problems, dysphoric mood and agitation. The patient is not nervous/anxious.        Objective:   Physical Exam  Constitutional: She is oriented to person, place, and time. She appears well-developed and well-nourished.  HENT:  Head: Normocephalic.  Right Ear: External ear normal.  Left Ear: External ear normal.  Mouth/Throat: Oropharynx is clear and moist.  Eyes: Conjunctivae and EOM are normal. Pupils are equal, round, and reactive to light.  Neck: Normal range of motion. Neck supple. No thyromegaly  present.  Cardiovascular: Normal rate, regular rhythm, normal heart sounds and intact distal pulses.   Pulmonary/Chest: Effort normal and breath sounds normal.  Abdominal: Soft. Bowel sounds are normal. She exhibits no mass. There is no tenderness.  Musculoskeletal: Normal range of motion. She exhibits edema.  Prominent lower extremity edema  Lymphadenopathy:    She has no cervical adenopathy.  Neurological: She is alert and oriented to person, place, and time.  Skin: Skin is warm and dry. No rash noted.  Psychiatric: She has a normal mood and affect. Her behavior is normal.  Assessment & Plan:  Obesity Hypertension controlled Chronic lower extremity edema Chronic diastolic heart failure  We'll continue present diuretic dose and salt restricted diet

## 2014-04-10 ENCOUNTER — Ambulatory Visit (HOSPITAL_COMMUNITY)
Admission: RE | Admit: 2014-04-10 | Discharge: 2014-04-10 | Disposition: A | Discharge status: Home / Self Care | Payer: Medicare Other | Source: Ambulatory Visit | Attending: Surgery | Admitting: Surgery

## 2014-04-10 ENCOUNTER — Encounter (INDEPENDENT_AMBULATORY_CARE_PROVIDER_SITE_OTHER): Payer: Self-pay

## 2014-04-10 DIAGNOSIS — G589 Mononeuropathy, unspecified: Secondary | ICD-10-CM | POA: Insufficient documentation

## 2014-04-10 DIAGNOSIS — C50919 Malignant neoplasm of unspecified site of unspecified female breast: Secondary | ICD-10-CM | POA: Insufficient documentation

## 2014-04-10 DIAGNOSIS — I10 Essential (primary) hypertension: Secondary | ICD-10-CM | POA: Insufficient documentation

## 2014-04-10 DIAGNOSIS — K224 Dyskinesia of esophagus: Secondary | ICD-10-CM | POA: Diagnosis not present

## 2014-04-10 DIAGNOSIS — K449 Diaphragmatic hernia without obstruction or gangrene: Secondary | ICD-10-CM | POA: Insufficient documentation

## 2014-04-10 DIAGNOSIS — M549 Dorsalgia, unspecified: Secondary | ICD-10-CM | POA: Insufficient documentation

## 2014-04-10 DIAGNOSIS — Z6839 Body mass index (BMI) 39.0-39.9, adult: Secondary | ICD-10-CM | POA: Diagnosis not present

## 2014-04-10 DIAGNOSIS — G8929 Other chronic pain: Secondary | ICD-10-CM | POA: Diagnosis not present

## 2014-04-10 DIAGNOSIS — Z01818 Encounter for other preprocedural examination: Secondary | ICD-10-CM | POA: Diagnosis not present

## 2014-04-10 DIAGNOSIS — I5030 Unspecified diastolic (congestive) heart failure: Secondary | ICD-10-CM | POA: Insufficient documentation

## 2014-04-10 DIAGNOSIS — Z86711 Personal history of pulmonary embolism: Secondary | ICD-10-CM | POA: Insufficient documentation

## 2014-04-10 DIAGNOSIS — I471 Supraventricular tachycardia: Secondary | ICD-10-CM

## 2014-04-10 DIAGNOSIS — D509 Iron deficiency anemia, unspecified: Secondary | ICD-10-CM | POA: Diagnosis not present

## 2014-04-21 ENCOUNTER — Telehealth: Payer: Self-pay | Admitting: Internal Medicine

## 2014-04-21 DIAGNOSIS — F411 Generalized anxiety disorder: Secondary | ICD-10-CM

## 2014-04-21 NOTE — Telephone Encounter (Signed)
Pt is needing new rx LORazepam (ATIVAN) 0.5 MG tablet, please call when available for pick up.

## 2014-04-22 ENCOUNTER — Other Ambulatory Visit: Payer: Self-pay | Admitting: Pain Medicine

## 2014-04-22 DIAGNOSIS — M5137 Other intervertebral disc degeneration, lumbosacral region: Secondary | ICD-10-CM

## 2014-04-23 MED ORDER — LORAZEPAM 0.5 MG PO TABS: 0.5000 mg | ORAL_TABLET | Freq: Two times a day (BID) | ORAL | Status: DC | PRN

## 2014-04-23 NOTE — Telephone Encounter (Signed)
Spoke to pt told her will call Rx into the pharmacy for her. Pt verbalized understanding. Rx phoned into pharmacy.

## 2014-04-24 ENCOUNTER — Other Ambulatory Visit: Payer: Self-pay

## 2014-04-24 ENCOUNTER — Ambulatory Visit
Admission: RE | Admit: 2014-04-24 | Payer: Managed Care, Other (non HMO) | Source: Ambulatory Visit | Admitting: Radiation Oncology

## 2014-04-27 DIAGNOSIS — M79609 Pain in unspecified limb: Secondary | ICD-10-CM | POA: Diagnosis not present

## 2014-04-27 DIAGNOSIS — G894 Chronic pain syndrome: Secondary | ICD-10-CM | POA: Diagnosis not present

## 2014-04-27 DIAGNOSIS — Z79899 Other long term (current) drug therapy: Secondary | ICD-10-CM | POA: Diagnosis not present

## 2014-04-27 DIAGNOSIS — M5137 Other intervertebral disc degeneration, lumbosacral region: Secondary | ICD-10-CM | POA: Diagnosis not present

## 2014-04-29 ENCOUNTER — Ambulatory Visit (INDEPENDENT_AMBULATORY_CARE_PROVIDER_SITE_OTHER): Payer: Medicare Other | Admitting: Internal Medicine

## 2014-04-29 ENCOUNTER — Encounter: Payer: Self-pay | Admitting: *Deleted

## 2014-04-29 ENCOUNTER — Encounter: Payer: Self-pay | Admitting: Internal Medicine

## 2014-04-29 DIAGNOSIS — I10 Essential (primary) hypertension: Secondary | ICD-10-CM | POA: Diagnosis not present

## 2014-04-29 DIAGNOSIS — M549 Dorsalgia, unspecified: Secondary | ICD-10-CM | POA: Diagnosis not present

## 2014-04-29 DIAGNOSIS — G8929 Other chronic pain: Secondary | ICD-10-CM

## 2014-04-29 LAB — COMPREHENSIVE METABOLIC PANEL
ALK PHOS: 152 U/L — AB (ref 39–117)
ALT: 28 U/L (ref 0–35)
AST: 27 U/L (ref 0–37)
Albumin: 3.8 g/dL (ref 3.5–5.2)
BUN: 22 mg/dL (ref 6–23)
CO2: 29 mEq/L (ref 19–32)
Calcium: 9.1 mg/dL (ref 8.4–10.5)
Chloride: 97 mEq/L (ref 96–112)
Creatinine, Ser: 1.1 mg/dL (ref 0.4–1.2)
GFR: 55.96 mL/min — ABNORMAL LOW (ref >60.00)
Glucose, Bld: 124 mg/dL — ABNORMAL HIGH (ref 70–99)
Potassium: 3.3 mEq/L — ABNORMAL LOW (ref 3.5–5.1)
SODIUM: 137 meq/L (ref 135–145)
TOTAL PROTEIN: 7.8 g/dL (ref 6.0–8.3)
Total Bilirubin: 0.4 mg/dL (ref 0.2–1.2)

## 2014-04-29 NOTE — Progress Notes (Signed)
   Subjective:    Patient ID: Alexandria [name], female    DOB: 15-Feb-1951, 63 y.o.   MRN: [id number]  HPI  Wt Readings from Last 3 Encounters:  04/29/14 239 lb (108.41 kg)  04/03/14 246 lb (111.585 kg)  03/18/14 231 lb 6.4 oz (104.962 kg)   BP Readings from Last 3 Encounters:  04/29/14 150/90  04/03/14 150/80  03/17/14 142/86   Review of Systems     Objective:   Physical Exam        Assessment & Plan:

## 2014-04-29 NOTE — Patient Instructions (Signed)
Limit your sodium (Salt) intake   You need to lose weight.  Consider a lower calorie diet and regular exercise. 

## 2014-04-29 NOTE — Progress Notes (Signed)
Subjective:    Patient ID: Alexandria [name], female    DOB: 23-Nov-1950, 63 y.o.   MRN: [id number]  HPI 63 year old patient who is seen today in followup.  She is considering bariatric surgery and is seen today for followup of her weight loss attempts.  Since her last visit here.  Her weight is down 5 pounds.  She is adherent to a diet and exercise program.  She is being followed at the [facility] health nutritional meetings monthly.  She weighs herself daily.  Past Medical History  Diagnosis Date  . Iron deficiency anemia   . Status post chemotherapy     4 cycles of Taxotere and cytoxan  . Chronic back pain   . Hx of pulmonary embolus     During c-Section  . Neuropathy   . Obesity     Class 2  . S/P radiation therapy  03/04/2013-04/17/2013    1) Right breast / 50 Gy in 25 fractions/ 2) Right breast boost / 10 Gy in 5 fractions  . Diastolic heart failure   . Hx of radiation therapy 03/04/13- 04/17/13    right breast 50 Gy 25 fractions, right breast boost 10 Gy 5 fractions  . 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 Dyspnea on exertion     History   Social History  . Marital Status: Married    Spouse Name: N/A    Number of Children: N/A  . Years of Education: N/A   Occupational History  . retired Therapist, sports    Social History Main Topics  . Smoking status: Never Smoker   . Smokeless tobacco: Never Used  . Alcohol Use: No  . Drug Use: No  . Sexual Activity: Yes   Other Topics Concern  . Not on file   Social History Narrative  . No narrative on file    Past Surgical History  Procedure Laterality Date  . Cesarean section    . Right breast lumpectomy      Family History  Problem Relation Age of Onset  . Parkinson's disease Mother   . Emphysema Maternal Grandfather   . COPD Sister     Allergies  Allergen Reactions  . Contrast Media [Iodinated Diagnostic Agents]  Anaphylaxis  . Albuterol Other (See Comments)    Anxious   . Prednisone Other (See Comments)    Pt gets very agitated when she takes high doses of steroids    Current Outpatient Prescriptions on File Prior to Visit  Medication Sig Dispense Refill  . albuterol (PROAIR HFA) 108 (90 BASE) MCG/ACT inhaler Inhale 2 puffs into the lungs every 4 (four) hours as needed.  18 g  5  . anastrozole (ARIMIDEX) 1 MG tablet Take 1 mg by mouth daily.      . budesonide (PULMICORT) 180 MCG/ACT inhaler Inhale 2 puffs into the lungs 2 (two) times daily.  1 Inhaler  6  . escitalopram (LEXAPRO) 20 MG tablet Take 1 tablet (20 mg total) by mouth daily.  90 tablet  3  . ferrous sulfate 325 (65 FE) MG tablet Take 325 mg by mouth daily with breakfast.      . furosemide (LASIX) 80 MG tablet Take 80 mg by mouth 2 (two) times daily. Increase to three times daily if gain more 2 lbs in a day.      Marland Kitchen HYDROmorphone (DILAUDID) 4 MG tablet Take  4 mg by mouth 3 (three) times daily as needed (pain).       Marland Kitchen HYDROmorphone HCl (EXALGO) 12 MG T24A SR tablet Take 12 mg by mouth 2 (two) times daily.      Marland Kitchen levalbuterol (XOPENEX) 0.63 MG/3ML nebulizer solution Take 3 mLs (0.63 mg total) by nebulization every 4 (four) hours as needed for wheezing or shortness of breath.  3 mL  12  . lidocaine (LIDODERM) 5 % Place 1-3 patches onto the skin daily. Remove & Discard patch within 12 hours or as directed by MD      . lidocaine-prilocaine (EMLA) cream Apply 1 application topically as needed (port access).       . LORazepam (ATIVAN) 0.5 MG tablet Take 1 tablet (0.5 mg total) by mouth 2 (two) times daily as needed for anxiety.  60 tablet  2  . lubiprostone (AMITIZA) 24 MCG capsule Take 1 capsule (24 mcg total) by mouth 2 (two) times daily with a meal.  60 capsule  0  . Multiple Vitamins-Minerals (HAIR/SKIN/NAILS PO) Take 1 tablet by mouth every morning.      . pantoprazole (PROTONIX) 40 MG tablet Take 1 tablet (40 mg total) by mouth daily.  30  tablet  0  . potassium chloride SA (K-DUR,KLOR-CON) 20 MEQ tablet TAKE 2 TABLETS BY MOUTH THREE TIMES DAILY  90 tablet  1   No current facility-administered medications on file prior to visit.    BP 150/90  Pulse 96  Temp(Src) 98.1 F (36.7 C) (Oral)  Resp 20  Ht 5' 6"  (1.676 m)  Wt 239 lb (108.41 kg)  BMI 38.59 kg/m2  SpO2 97%  LMP 01/30/2005      Review of Systems  Constitutional: Negative.   HENT: Negative for congestion, dental problem, hearing loss, rhinorrhea, sinus pressure, sore throat and tinnitus.   Eyes: Negative for pain, discharge and visual disturbance.  Respiratory: Negative for cough and shortness of breath.   Cardiovascular: Positive for leg swelling. Negative for chest pain and palpitations.  Gastrointestinal: Negative for nausea, vomiting, abdominal pain, diarrhea, constipation, blood in stool and abdominal distention.  Genitourinary: Negative for dysuria, urgency, frequency, hematuria, flank pain, vaginal bleeding, vaginal discharge, difficulty urinating, vaginal pain and pelvic pain.  Musculoskeletal: Negative for arthralgias, gait problem and joint swelling.  Skin: Negative for rash.  Neurological: Negative for dizziness, syncope, speech difficulty, weakness, numbness and headaches.  Hematological: Negative for adenopathy.  Psychiatric/Behavioral: Negative for behavioral problems, dysphoric mood and agitation. The patient is not nervous/anxious.        Objective:   Physical Exam  Constitutional: She is oriented to person, place, and time. She appears well-developed and well-nourished.  Repeat blood pressure 140/80  HENT:  Head: Normocephalic.  Right Ear: External ear normal.  Left Ear: External ear normal.  Mouth/Throat: Oropharynx is clear and moist.  Eyes: Conjunctivae and EOM are normal. Pupils are equal, round, and reactive to light.  Neck: Normal range of motion. Neck supple. No thyromegaly present.  Cardiovascular: Normal rate, regular  rhythm, normal heart sounds and intact distal pulses.   Pulmonary/Chest: Effort normal and breath sounds normal.  Abdominal: Soft. Bowel sounds are normal. She exhibits no mass. There is no tenderness.  Musculoskeletal: Normal range of motion. She exhibits edema.  Plus 2 edema.  Improved  Lymphadenopathy:    She has no cervical adenopathy.  Neurological: She is alert and oriented to person, place, and time.  Skin: Skin is warm and dry. No rash noted.  Psychiatric: She  has a normal mood and affect. Her behavior is normal.          Assessment & Plan:  Hypertension Lower extremity edema, improved Obesity Large hiatal hernia  We'll check electrolytes No change in therapy Recheck one month per protocol

## 2014-04-29 NOTE — Progress Notes (Signed)
Pre visit review using our clinic review tool, if applicable. No additional management support is needed unless otherwise documented below in the visit note. 

## 2014-04-30 ENCOUNTER — Ambulatory Visit: Payer: Self-pay | Admitting: Internal Medicine

## 2014-05-01 ENCOUNTER — Other Ambulatory Visit: Payer: Self-pay

## 2014-05-04 ENCOUNTER — Telehealth: Payer: Self-pay | Admitting: Internal Medicine

## 2014-05-04 NOTE — Telephone Encounter (Signed)
Pt was seen last week, pt states her coughing has gotten worse, pt would like for dr k to call her something in pt states last time he gave her meds with codene in it and it seemed to help. Sent to [facility]- [address]. Would like for you to call her.

## 2014-05-04 NOTE — Telephone Encounter (Signed)
Hydromet 6 ounces 1 teaspoon  every 6 hours as needed for cough and congestion 

## 2014-05-04 NOTE — Telephone Encounter (Signed)
Please advise 

## 2014-05-05 ENCOUNTER — Other Ambulatory Visit: Payer: Self-pay

## 2014-05-05 MED ORDER — HYDROCODONE-HOMATROPINE 5-1.5 MG/5ML PO SYRP: 5.0000 mL | ORAL_SOLUTION | Freq: Four times a day (QID) | ORAL | Status: DC | PRN

## 2014-05-05 NOTE — Telephone Encounter (Signed)
Pt notified Rx for Hycodan cough syrup ready for pickup. Rx printed and signed.

## 2014-05-06 ENCOUNTER — Ambulatory Visit: Payer: Self-pay | Admitting: Dietician

## 2014-05-11 ENCOUNTER — Encounter: Payer: Medicare Other | Attending: Surgery | Admitting: Dietician

## 2014-05-11 DIAGNOSIS — Z713 Dietary counseling and surveillance: Secondary | ICD-10-CM | POA: Diagnosis not present

## 2014-05-11 NOTE — Progress Notes (Signed)
  Medical Nutrition Therapy:  Appt start time: 445 end time:  500.  SWL visit #1:  Primary concerns today: Alexandria [name] returns today for her first SWL visit in preparation for gastric sleeve surgery. She reports she has been drinking chocolate milk, as it has several grams of protein. She reports that she is having a hard time eating due to hiatal hernia, but admits to "eating the wrong things." She states she has been drinking a protein shake with 15g protein and 10 grams carbs. Has excess fluid in lower extremities today.  MEDICATIONS: see list  Recent physical activity: unknown  Estimated energy needs: 1500-1600 calories  Progress Towards Goal(s):  In progress.   Nutritional Diagnosis:  -3.3 Overweight/obesity related to past poor dietary habits and physical inactivity as evidenced by patient w/ pending gastric sleeve surgery following dietary guidelines for continued weight loss.     Intervention:  Nutrition counseling provided.  Handouts given during visit include:  Protein shakes  Monitoring/Evaluation:  Dietary intake, exercise, pre op goals, and body weight in 4 week(s).

## 2014-05-12 ENCOUNTER — Ambulatory Visit: Payer: Self-pay | Admitting: Internal Medicine

## 2014-05-13 ENCOUNTER — Telehealth: Payer: Self-pay | Admitting: Pulmonary Disease

## 2014-05-13 ENCOUNTER — Other Ambulatory Visit: Payer: Self-pay

## 2014-05-13 NOTE — Telephone Encounter (Signed)
Called rose and the office was already closed  [facility]

## 2014-05-14 NOTE — Telephone Encounter (Signed)
The pt has an allergy to contrat dye and was concerned that the CT was ordered with contrast. She felt that this was a mistake and that the CT should be without contrast. I advised the pt that I will send a message to [facility] on his return and clarify with him. If the CT is to be with contrast the pt will need to be pre-treated adn the CT will have to be done at the hospital, not at [facility]. Please advise. Helper Bing, CMA

## 2014-05-15 ENCOUNTER — Inpatient Hospital Stay: Admission: RE | Admit: 2014-05-15 | Payer: Self-pay | Source: Ambulatory Visit

## 2014-05-15 ENCOUNTER — Ambulatory Visit: Payer: Self-pay | Admitting: Internal Medicine

## 2014-05-18 ENCOUNTER — Encounter: Payer: Self-pay | Admitting: Internal Medicine

## 2014-05-18 ENCOUNTER — Ambulatory Visit (INDEPENDENT_AMBULATORY_CARE_PROVIDER_SITE_OTHER): Payer: Medicare Other | Admitting: Internal Medicine

## 2014-05-18 ENCOUNTER — Other Ambulatory Visit: Payer: Self-pay | Admitting: Pulmonary Disease

## 2014-05-18 DIAGNOSIS — J069 Acute upper respiratory infection, unspecified: Secondary | ICD-10-CM

## 2014-05-18 DIAGNOSIS — K449 Diaphragmatic hernia without obstruction or gangrene: Secondary | ICD-10-CM

## 2014-05-18 DIAGNOSIS — R590 Localized enlarged lymph nodes: Secondary | ICD-10-CM

## 2014-05-18 DIAGNOSIS — B9789 Other viral agents as the cause of diseases classified elsewhere: Secondary | ICD-10-CM

## 2014-05-18 MED ORDER — HYDROCODONE-HOMATROPINE 5-1.5 MG/5ML PO SYRP: 5.0000 mL | ORAL_SOLUTION | Freq: Four times a day (QID) | ORAL | Status: DC | PRN

## 2014-05-18 NOTE — Progress Notes (Signed)
Pre visit review using our clinic review tool, if applicable. No additional management support is needed unless otherwise documented below in the visit note. 

## 2014-05-18 NOTE — Patient Instructions (Signed)
Acute bronchitis symptoms for less than 10 days are generally not helped by antibiotics.  Take over-the-counter expectorants and cough medications such as  Mucinex DM.  Call if there is no improvement in 5 to 7 days or if  you develop worsening cough, fever, or new symptoms, such as shortness of breath or chest pain.   

## 2014-05-18 NOTE — Telephone Encounter (Signed)
Let her know that typically if we are looking specifically at lymph nodes in the chest, we use contrast.  If there is an allergy, we have to try and determine if the need outweighs the risk, and we can pretreat ahead of time.  I have gone back and looked at her prior ct which was done without contrast, and her lymph nodes were easily visible.  This often is not the case and usually requires contrast.  Will therefore change ct to without contrast.  Order has been sent.

## 2014-05-18 NOTE — Progress Notes (Signed)
Subjective:    Patient ID: Alexandria [name], female    DOB: 1951-05-02, 63 y.o.   MRN: [id number]  HPI  63 year old patient who presents with a three-day history of chest congestion, cough and congestion.  She does have a history of diastolic dysfunction, but denies any shortness of breath or wheezing.  She does use an alleged all medications. Denies any fever or productive cough  Past Medical History  Diagnosis Date  . Iron deficiency anemia   . Status post chemotherapy     4 cycles of Taxotere and cytoxan  . Chronic back pain   . Hx of pulmonary embolus     During c-Section  . Neuropathy   . Obesity     Class 2  . S/P radiation therapy  03/04/2013-04/17/2013    1) Right breast / 50 Gy in 25 fractions/ 2) Right breast boost / 10 Gy in 5 fractions  . Diastolic heart failure   . Hx of radiation therapy 03/04/13- 04/17/13    right breast 50 Gy 25 fractions, right breast boost 10 Gy 5 fractions  . 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 Dyspnea on exertion     History   Social History  . Marital Status: Married    Spouse Name: N/A    Number of Children: N/A  . Years of Education: N/A   Occupational History  . retired Therapist, sports    Social History Main Topics  . Smoking status: Never Smoker   . Smokeless tobacco: Never Used  . Alcohol Use: No  . Drug Use: No  . Sexual Activity: Yes   Other Topics Concern  . Not on file   Social History Narrative  . No narrative on file    Past Surgical History  Procedure Laterality Date  . Cesarean section    . Right breast lumpectomy      Family History  Problem Relation Age of Onset  . Parkinson's disease Mother   . Emphysema Maternal Grandfather   . COPD Sister     Allergies  Allergen Reactions  . Contrast Media [Iodinated Diagnostic Agents] Anaphylaxis  . Albuterol Other (See Comments)    Anxious   . Prednisone Other (See  Comments)    Pt gets very agitated when she takes high doses of steroids    Current Outpatient Prescriptions on File Prior to Visit  Medication Sig Dispense Refill  . albuterol (PROAIR HFA) 108 (90 BASE) MCG/ACT inhaler Inhale 2 puffs into the lungs every 4 (four) hours as needed.  18 g  5  . anastrozole (ARIMIDEX) 1 MG tablet Take 1 mg by mouth daily.      . budesonide (PULMICORT) 180 MCG/ACT inhaler Inhale 2 puffs into the lungs 2 (two) times daily.  1 Inhaler  6  . escitalopram (LEXAPRO) 20 MG tablet Take 1 tablet (20 mg total) by mouth daily.  90 tablet  3  . ferrous sulfate 325 (65 FE) MG tablet Take 325 mg by mouth daily with breakfast.      . furosemide (LASIX) 80 MG tablet Take 80 mg by mouth 2 (two) times daily. Increase to three times daily if gain more 2 lbs in a day.      Marland Kitchen HYDROmorphone (DILAUDID) 4 MG tablet Take 4 mg by mouth 3 (three) times daily as needed (pain).       Marland Kitchen HYDROmorphone HCl (  EXALGO) 12 MG T24A SR tablet Take 12 mg by mouth 2 (two) times daily.      Marland Kitchen levalbuterol (XOPENEX) 0.63 MG/3ML nebulizer solution Take 3 mLs (0.63 mg total) by nebulization every 4 (four) hours as needed for wheezing or shortness of breath.  3 mL  12  . lidocaine (LIDODERM) 5 % Place 1-3 patches onto the skin daily. Remove & Discard patch within 12 hours or as directed by MD      . lidocaine-prilocaine (EMLA) cream Apply 1 application topically as needed (port access).       . LORazepam (ATIVAN) 0.5 MG tablet Take 1 tablet (0.5 mg total) by mouth 2 (two) times daily as needed for anxiety.  60 tablet  2  . lubiprostone (AMITIZA) 24 MCG capsule Take 1 capsule (24 mcg total) by mouth 2 (two) times daily with a meal.  60 capsule  0  . Multiple Vitamins-Minerals (HAIR/SKIN/NAILS PO) Take 1 tablet by mouth every morning.      . pantoprazole (PROTONIX) 40 MG tablet Take 1 tablet (40 mg total) by mouth daily.  30 tablet  0  . potassium chloride SA (K-DUR,KLOR-CON) 20 MEQ tablet TAKE 2 TABLETS BY  MOUTH THREE TIMES DAILY  90 tablet  1   No current facility-administered medications on file prior to visit.    BP 163/89  Pulse 79  Temp(Src) 97.7 F (36.5 C) (Oral)  Resp 24  SpO2 97%  LMP 01/30/2005     Review of Systems  Constitutional: Positive for activity change and appetite change.  HENT: Positive for congestion. Negative for dental problem, hearing loss, rhinorrhea, sinus pressure, sore throat and tinnitus.   Eyes: Negative for pain, discharge and visual disturbance.  Respiratory: Positive for cough. Negative for shortness of breath and wheezing.   Cardiovascular: Negative for chest pain, palpitations and leg swelling.  Gastrointestinal: Negative for nausea, vomiting, abdominal pain, diarrhea, constipation, blood in stool and abdominal distention.  Genitourinary: Negative for dysuria, urgency, frequency, hematuria, flank pain, vaginal bleeding, vaginal discharge, difficulty urinating, vaginal pain and pelvic pain.  Musculoskeletal: Negative for arthralgias, gait problem and joint swelling.  Skin: Negative for rash.  Neurological: Negative for dizziness, syncope, speech difficulty, weakness, numbness and headaches.  Hematological: Negative for adenopathy.  Psychiatric/Behavioral: Negative for behavioral problems, dysphoric mood and agitation. The patient is not nervous/anxious.        Objective:   Physical Exam  Constitutional: She is oriented to person, place, and time. She appears well-developed and well-nourished. No distress.  HENT:  Head: Normocephalic.  Right Ear: External ear normal.  Left Ear: External ear normal.  Mouth/Throat: Oropharynx is clear and moist.  Eyes: Conjunctivae and EOM are normal. Pupils are equal, round, and reactive to light.  Neck: Normal range of motion. Neck supple. No thyromegaly present.  Cardiovascular: Normal rate, regular rhythm, normal heart sounds and intact distal pulses.   Pulmonary/Chest: Effort normal and breath sounds  normal. No respiratory distress. She has no wheezes. She has no rales.  O2 saturation 97%  Abdominal: Soft. Bowel sounds are normal. She exhibits no mass. There is no tenderness.  Musculoskeletal: Normal range of motion.  Lymphadenopathy:    She has no cervical adenopathy.  Neurological: She is alert and oriented to person, place, and time.  Skin: Skin is warm and dry. No rash noted.  Psychiatric: She has a normal mood and affect. Her behavior is normal.          Assessment & Plan:   Viral URI.  Will treat symptomatically Chronic diastolic heart failure History of chronic respiratory failure Obesity

## 2014-05-18 NOTE — Telephone Encounter (Signed)
Patient left voicemail on Friday 05/15/14 inquiring about a protein powder she purchased called Amplified Wheybolic Extreme.  Serving size: 3 scoops Protein 60 gm Carbohydrates 7 gm  Advised the patient to use only 1.5 scoops per protein shake and to only have 1 shake per day prior to surgery.  The patient verbalized understanding.

## 2014-05-21 ENCOUNTER — Other Ambulatory Visit: Payer: Self-pay

## 2014-05-22 ENCOUNTER — Ambulatory Visit: Payer: Self-pay | Admitting: Internal Medicine

## 2014-05-22 ENCOUNTER — Encounter: Payer: Self-pay | Admitting: Internal Medicine

## 2014-05-22 ENCOUNTER — Ambulatory Visit (INDEPENDENT_AMBULATORY_CARE_PROVIDER_SITE_OTHER): Payer: Medicare Other | Admitting: Internal Medicine

## 2014-05-22 DIAGNOSIS — E785 Hyperlipidemia, unspecified: Secondary | ICD-10-CM | POA: Diagnosis not present

## 2014-05-22 DIAGNOSIS — J441 Chronic obstructive pulmonary disease with (acute) exacerbation: Secondary | ICD-10-CM

## 2014-05-22 DIAGNOSIS — I2584 Coronary atherosclerosis due to calcified coronary lesion: Secondary | ICD-10-CM

## 2014-05-22 DIAGNOSIS — I2510 Atherosclerotic heart disease of native coronary artery without angina pectoris: Secondary | ICD-10-CM

## 2014-05-22 DIAGNOSIS — I10 Essential (primary) hypertension: Secondary | ICD-10-CM

## 2014-05-22 DIAGNOSIS — D509 Iron deficiency anemia, unspecified: Secondary | ICD-10-CM

## 2014-05-22 DIAGNOSIS — I5033 Acute on chronic diastolic (congestive) heart failure: Secondary | ICD-10-CM | POA: Diagnosis not present

## 2014-05-22 DIAGNOSIS — R590 Localized enlarged lymph nodes: Secondary | ICD-10-CM

## 2014-05-22 LAB — CBC WITH DIFFERENTIAL/PLATELET
BASOS PCT: 0 % (ref 0–1)
Basophils Absolute: 0 10*3/uL (ref 0.0–0.1)
Eosinophils Absolute: 0.2 10*3/uL (ref 0.0–0.7)
Eosinophils Relative: 3 % (ref 0–5)
HCT: 37.2 % (ref 36.0–46.0)
HEMOGLOBIN: 12.4 g/dL (ref 12.0–15.0)
Lymphocytes Relative: 23 % (ref 12–46)
Lymphs Abs: 1.4 10*3/uL (ref 0.7–4.0)
MCH: 27.3 pg (ref 26.0–34.0)
MCHC: 33.3 g/dL (ref 30.0–36.0)
MCV: 81.9 fL (ref 78.0–100.0)
MONOS PCT: 7 % (ref 3–12)
Monocytes Absolute: 0.4 10*3/uL (ref 0.1–1.0)
NEUTROS PCT: 67 % (ref 43–77)
Neutro Abs: 4.1 10*3/uL (ref 1.7–7.7)
PLATELETS: 248 10*3/uL (ref 150–400)
RBC: 4.54 MIL/uL (ref 3.87–5.11)
RDW: 17.1 % — ABNORMAL HIGH (ref 11.5–15.5)
WBC: 6.1 10*3/uL (ref 4.0–10.5)

## 2014-05-22 MED ORDER — AZITHROMYCIN 250 MG PO TABS: ORAL_TABLET | ORAL | Status: DC

## 2014-05-22 NOTE — Patient Instructions (Signed)
    Take over-the-counter expectorants and cough medications such as  Mucinex DM.  Call if there is no improvement in 5 to 7 days or if  you develop worsening cough, fever, or new symptoms, such as shortness of breath or chest pain.  Take your antibiotic as prescribed until ALL of it is gone, but stop if you develop a rash, swelling, or any side effects of the medication.  Contact our office as soon as possible if  there are side effects of the medication.  Chest CT as scheduled  Return in one month for follow-up

## 2014-05-22 NOTE — Progress Notes (Signed)
Pre visit review using our clinic review tool, if applicable. No additional management support is needed unless otherwise documented below in the visit note. 

## 2014-05-22 NOTE — Progress Notes (Signed)
Subjective:    Patient ID: Alexandria [name], female    DOB: [birth year], 63 y.o.   MRN: [id number]  HPI No results found for this basename: CHOL, HDL, LDLCALC, LDLDIRECT, TRIG, CHOLHDL   63 year old patient who is seen today in followup. She is considering bariatric surgery and is seen today for followup of her weight loss attempts. Since 05/11/14, her weight is up 12 pounds. She is adherent to a diet and exercise program but has had worsening of her lower extremity edema.  She has recently increased her diuretic dose She is being followed at the [place] meetings monthly. She weighs herself daily.  She was seen 4 days ago with a URI.  She has developed more chest congestion and now has productive cough yielding green sputum.  She has been slightly more short of breath, but no fever, or wheezing.  O2 saturation today 93%.  She is scheduled for followup chest CT scan the week.  Earlier chest CT revealed coronary artery calcification.  No recent lipid profile on file  She is a history of anemia and is also followed by hematology  Wt Readings from Last 3 Encounters:  05/22/14 249 lb (112.946 kg)  05/11/14 237 lb (107.502 kg)  04/29/14 239 lb (108.41 kg)    Past Medical History  Diagnosis Date  . Iron deficiency anemia   . Status post chemotherapy     4 cycles of Taxotere and cytoxan  . Chronic back pain   . Hx of pulmonary embolus     During c-Section  . Neuropathy   . Obesity     Class 2  . S/P radiation therapy  03/04/2013-04/17/2013    1) Right breast / 50 Gy in 25 fractions/ 2) Right breast boost / 10 Gy in 5 fractions  . Diastolic heart failure   . Hx of radiation therapy 03/04/13- 04/17/13    right breast 50 Gy 25 fractions, right breast boost 10 Gy 5 fractions  . 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 Dyspnea on exertion     History   Social History  .  Marital Status: Married    Spouse Name: N/A    Number of Children: N/A  . Years of Education: N/A   Occupational History  . retired Therapist, sports    Social History Main Topics  . Smoking status: Never Smoker   . Smokeless tobacco: Never Used  . Alcohol Use: No  . Drug Use: No  . Sexual Activity: Yes   Other Topics Concern  . Not on file   Social History Narrative  . No narrative on file    Past Surgical History  Procedure Laterality Date  . Cesarean section    . Right breast lumpectomy      Family History  Problem Relation Age of Onset  . Parkinson's disease Mother   . Emphysema Maternal Grandfather   . COPD Sister     Allergies  Allergen Reactions  . Contrast Media [Iodinated Diagnostic Agents] Anaphylaxis  . Albuterol Other (See Comments)    Anxious   . Prednisone Other (See Comments)    Pt gets very agitated when she takes high doses of steroids    Current Outpatient Prescriptions on File Prior to Visit  Medication Sig Dispense Refill  . albuterol (PROAIR HFA) 108 (90 BASE) MCG/ACT inhaler Inhale 2 puffs into the lungs every  4 (four) hours as needed.  18 g  5  . anastrozole (ARIMIDEX) 1 MG tablet Take 1 mg by mouth daily.      . budesonide (PULMICORT) 180 MCG/ACT inhaler Inhale 2 puffs into the lungs 2 (two) times daily.  1 Inhaler  6  . escitalopram (LEXAPRO) 20 MG tablet Take 1 tablet (20 mg total) by mouth daily.  90 tablet  3  . ferrous sulfate 325 (65 FE) MG tablet Take 325 mg by mouth daily with breakfast.      . furosemide (LASIX) 80 MG tablet Take 80 mg by mouth 2 (two) times daily. Increase to three times daily if gain more 2 lbs in a day.      Marland Kitchen HYDROcodone-homatropine (HYCODAN) 5-1.5 MG/5ML syrup Take 5 mLs by mouth every 6 (six) hours as needed for cough.  120 mL  0  . HYDROmorphone (DILAUDID) 4 MG tablet Take 4 mg by mouth 3 (three) times daily as needed (pain).       Marland Kitchen HYDROmorphone HCl (EXALGO) 12 MG T24A SR tablet Take 12 mg by mouth 2 (two) times daily.       Marland Kitchen levalbuterol (XOPENEX) 0.63 MG/3ML nebulizer solution Take 3 mLs (0.63 mg total) by nebulization every 4 (four) hours as needed for wheezing or shortness of breath.  3 mL  12  . lidocaine (LIDODERM) 5 % Place 1-3 patches onto the skin daily. Remove & Discard patch within 12 hours or as directed by MD      . lidocaine-prilocaine (EMLA) cream Apply 1 application topically as needed (port access).       . LORazepam (ATIVAN) 0.5 MG tablet Take 1 tablet (0.5 mg total) by mouth 2 (two) times daily as needed for anxiety.  60 tablet  2  . lubiprostone (AMITIZA) 24 MCG capsule Take 1 capsule (24 mcg total) by mouth 2 (two) times daily with a meal.  60 capsule  0  . Multiple Vitamins-Minerals (HAIR/SKIN/NAILS PO) Take 1 tablet by mouth every morning.      . pantoprazole (PROTONIX) 40 MG tablet Take 1 tablet (40 mg total) by mouth daily.  30 tablet  0  . potassium chloride SA (K-DUR,KLOR-CON) 20 MEQ tablet TAKE 2 TABLETS BY MOUTH THREE TIMES DAILY  90 tablet  1   No current facility-administered medications on file prior to visit.    BP 132/90  Pulse 112  Temp(Src) 97.9 F (36.6 C) (Oral)  Resp 22  Ht 5' 6"  (1.676 m)  Wt 249 lb (112.946 kg)  BMI 40.21 kg/m2  SpO2 93%  LMP 01/30/2005      Review of Systems  Constitutional: Positive for activity change and fatigue.  HENT: Negative for congestion, dental problem, hearing loss, rhinorrhea, sinus pressure, sore throat and tinnitus.   Eyes: Negative for pain, discharge and visual disturbance.  Respiratory: Positive for cough. Negative for shortness of breath and wheezing.   Cardiovascular: Positive for leg swelling. Negative for chest pain and palpitations.  Gastrointestinal: Negative for nausea, vomiting, abdominal pain, diarrhea, constipation, blood in stool and abdominal distention.  Genitourinary: Negative for dysuria, urgency, frequency, hematuria, flank pain, vaginal bleeding, vaginal discharge, difficulty urinating, vaginal pain and  pelvic pain.  Musculoskeletal: Negative for arthralgias, gait problem and joint swelling.  Skin: Negative for rash.  Neurological: Negative for dizziness, syncope, speech difficulty, weakness, numbness and headaches.  Hematological: Negative for adenopathy.  Psychiatric/Behavioral: Negative for behavioral problems, dysphoric mood and agitation. The patient is not nervous/anxious.  Objective:   Physical Exam  Constitutional: She is oriented to person, place, and time. She appears well-developed and well-nourished. No distress.  No distress at rest Pulse rate after ambulation to the examining room 112 Post right at the time of my exam 90  O2 saturation 93%  HENT:  Head: Normocephalic.  Right Ear: External ear normal.  Left Ear: External ear normal.  Mouth/Throat: Oropharynx is clear and moist.  Eyes: Conjunctivae and EOM are normal. Pupils are equal, round, and reactive to light.  Neck: Normal range of motion. Neck supple. No thyromegaly present.  Cardiovascular: Normal rate, regular rhythm, normal heart sounds and intact distal pulses.   Pulmonary/Chest: Effort normal.  Bilateral coarse rhonchi  Abdominal: Soft. Bowel sounds are normal. She exhibits no mass. There is no tenderness.  Musculoskeletal: Normal range of motion. She exhibits edema.  Prominent lower extremity edema, left greater than right  Lymphadenopathy:    She has no cervical adenopathy.  Neurological: She is alert and oriented to person, place, and time.  Skin: Skin is warm and dry. No rash noted.  Psychiatric: She has a normal mood and affect. Her behavior is normal.          Assessment & Plan:   Lower extremity edema/history of chronic diastolic heart failure.  Will increase diuretic dose History of anemia.  Will check CBC Coronary artery calcification.  Will check a lipid profile.  We'll discuss statin therapy.  Next visit in 3 weeks  Exacerbation COPD.  We'll continue mucolytics add azithromycin.   We'll call if there is any clinical deterioration

## 2014-05-23 LAB — LIPID PANEL
CHOL/HDL RATIO: 4.5 ratio
Cholesterol: 185 mg/dL (ref 0–200)
HDL: 41 mg/dL (ref >39)
LDL CALC: 104 mg/dL — AB (ref 0–99)
TRIGLYCERIDES: 200 mg/dL — AB (ref <150)
VLDL: 40 mg/dL (ref 0–40)

## 2014-05-26 ENCOUNTER — Other Ambulatory Visit: Payer: Self-pay | Admitting: Internal Medicine

## 2014-05-26 ENCOUNTER — Other Ambulatory Visit: Payer: Self-pay | Admitting: Oncology

## 2014-05-26 ENCOUNTER — Ambulatory Visit: Payer: Self-pay | Admitting: Internal Medicine

## 2014-05-26 DIAGNOSIS — C50419 Malignant neoplasm of upper-outer quadrant of unspecified female breast: Secondary | ICD-10-CM

## 2014-05-28 DIAGNOSIS — M1990 Unspecified osteoarthritis, unspecified site: Secondary | ICD-10-CM | POA: Diagnosis not present

## 2014-05-28 DIAGNOSIS — G894 Chronic pain syndrome: Secondary | ICD-10-CM | POA: Diagnosis not present

## 2014-05-28 DIAGNOSIS — Z79899 Other long term (current) drug therapy: Secondary | ICD-10-CM | POA: Diagnosis not present

## 2014-05-28 DIAGNOSIS — M5137 Other intervertebral disc degeneration, lumbosacral region: Secondary | ICD-10-CM | POA: Diagnosis not present

## 2014-06-15 ENCOUNTER — Ambulatory Visit: Payer: Medicare Other | Admitting: Internal Medicine

## 2014-06-16 ENCOUNTER — Ambulatory Visit: Payer: Medicare Other | Admitting: Internal Medicine

## 2014-06-18 ENCOUNTER — Encounter: Payer: Self-pay | Admitting: Internal Medicine

## 2014-06-18 ENCOUNTER — Ambulatory Visit (INDEPENDENT_AMBULATORY_CARE_PROVIDER_SITE_OTHER): Payer: Medicare Other | Admitting: Internal Medicine

## 2014-06-18 DIAGNOSIS — R600 Localized edema: Secondary | ICD-10-CM

## 2014-06-18 DIAGNOSIS — I2510 Atherosclerotic heart disease of native coronary artery without angina pectoris: Secondary | ICD-10-CM | POA: Diagnosis not present

## 2014-06-18 DIAGNOSIS — I2584 Coronary atherosclerosis due to calcified coronary lesion: Secondary | ICD-10-CM

## 2014-06-18 DIAGNOSIS — R599 Enlarged lymph nodes, unspecified: Secondary | ICD-10-CM

## 2014-06-18 DIAGNOSIS — R590 Localized enlarged lymph nodes: Secondary | ICD-10-CM

## 2014-06-18 DIAGNOSIS — I10 Essential (primary) hypertension: Secondary | ICD-10-CM | POA: Diagnosis not present

## 2014-06-18 DIAGNOSIS — F339 Major depressive disorder, recurrent, unspecified: Secondary | ICD-10-CM

## 2014-06-18 HISTORY — DX: Major depressive disorder, recurrent, unspecified: F33.9

## 2014-06-18 MED ORDER — ATORVASTATIN CALCIUM 20 MG PO TABS: 20.0000 mg | ORAL_TABLET | Freq: Every day | ORAL | Status: DC

## 2014-06-18 MED ORDER — HYDROCODONE-HOMATROPINE 5-1.5 MG/5ML PO SYRP: 5.0000 mL | ORAL_SOLUTION | Freq: Four times a day (QID) | ORAL | Status: AC | PRN

## 2014-06-18 NOTE — Progress Notes (Signed)
Subjective:    Patient ID: Alexandria [name], female    DOB: 17-Apr-1951, 63 y.o.   MRN: [id number]  HPI  Wt Readings from Last 3 Encounters:  06/18/14 259 lb (117.482 kg)  05/22/14 249 lb (112.946 kg)  05/11/14 237 lb (107.45 kg)     63 year old patient who is seen today in followup.  She has a history of morbid obesity and has been followed closely by nutrition.  She states that she is on a 800-thousand calorie diet, but has gained 10 pounds in weight since her last visit here.  She is quite frustrated.  She does have considerable lower extremity edema. She is followed by pulmonary medicine and does have a history of mediastinal adenopathy.  That has been felt to be reactive.  She does have a prior history of breast cancer treated with radiotherapy.  She is scheduled for followup chest CT next week CT scans have revealed coronary artery calcification.  Statin therapy discussed with the patient She is followed by Gen. surgery and is scheduled for repair of a large hilar hernia, as well as bariatric surgery later this month  Past Medical History  Diagnosis Date  . Iron deficiency anemia   . Status post chemotherapy     4 cycles of Taxotere and cytoxan  . Chronic back pain   . Hx of pulmonary embolus     During c-Section  . Neuropathy   . Obesity     Class 2  . S/P radiation therapy  03/04/2013-04/17/2013    1) Right breast / 50 Gy in 25 fractions/ 2) Right breast boost / 10 Gy in 5 fractions  . Diastolic heart failure   . Hx of radiation therapy 03/04/13- 04/17/13    right breast 50 Gy 25 fractions, right breast boost 10 Gy 5 fractions  . 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 Dyspnea on exertion     History   Social History  . Marital Status: Married    Spouse Name: N/A    Number of Children: N/A  . Years of Education: N/A   Occupational History  . retired Therapist, sports    Social  History Main Topics  . Smoking status: Never Smoker   . Smokeless tobacco: Never Used  . Alcohol Use: No  . Drug Use: No  . Sexual Activity: Yes   Other Topics Concern  . Not on file   Social History Narrative  . No narrative on file    Past Surgical History  Procedure Laterality Date  . Cesarean section    . Right breast lumpectomy      Family History  Problem Relation Age of Onset  . Parkinson's disease Mother   . Emphysema Maternal Grandfather   . COPD Sister     Allergies  Allergen Reactions  . Contrast Media [Iodinated Diagnostic Agents] Anaphylaxis  . Albuterol Other (See Comments)    Anxious   . Prednisone Other (See Comments)    Pt gets very agitated when she takes high doses of steroids    Current Outpatient Prescriptions on File Prior to Visit  Medication Sig Dispense Refill  . albuterol (PROAIR HFA) 108 (90 BASE) MCG/ACT inhaler Inhale 2 puffs into the lungs every 4 (four) hours as needed.  18 g  5  . anastrozole (ARIMIDEX) 1 MG tablet TAKE 1 TABLET BY MOUTH DAILY  90 tablet  0  . budesonide (PULMICORT) 180 MCG/ACT inhaler Inhale 2 puffs into the lungs 2 (two) times daily.  1 Inhaler  6  . escitalopram (LEXAPRO) 20 MG tablet Take 1 tablet (20 mg total) by mouth daily.  90 tablet  3  . ferrous sulfate 325 (65 FE) MG tablet Take 325 mg by mouth daily with breakfast.      . furosemide (LASIX) 80 MG tablet TAKE 1 TABLET BY MOUTH TWICE DAILY, MAY TAKE AN EXTRA TABLET IF NEEDED FOR INCREASED SWELLING OR RAPID WEIGHT GAIN  90 tablet  2  . HYDROmorphone (DILAUDID) 4 MG tablet Take 4 mg by mouth 3 (three) times daily as needed (pain).       Marland Kitchen HYDROmorphone HCl (EXALGO) 12 MG T24A SR tablet Take 12 mg by mouth 2 (two) times daily.      Marland Kitchen levalbuterol (XOPENEX) 0.63 MG/3ML nebulizer solution Take 3 mLs (0.63 mg total) by nebulization every 4 (four) hours as needed for wheezing or shortness of breath.  3 mL  12  . lidocaine (LIDODERM) 5 % Place 1-3 patches onto the skin  daily. Remove & Discard patch within 12 hours or as directed by MD      . lidocaine-prilocaine (EMLA) cream Apply 1 application topically as needed (port access).       . LORazepam (ATIVAN) 0.5 MG tablet Take 1 tablet (0.5 mg total) by mouth 2 (two) times daily as needed for anxiety.  60 tablet  2  . lubiprostone (AMITIZA) 24 MCG capsule Take 1 capsule (24 mcg total) by mouth 2 (two) times daily with a meal.  60 capsule  0  . Multiple Vitamins-Minerals (HAIR/SKIN/NAILS PO) Take 1 tablet by mouth every morning.      . pantoprazole (PROTONIX) 40 MG tablet Take 1 tablet (40 mg total) by mouth daily.  30 tablet  0  . potassium chloride SA (K-DUR,KLOR-CON) 20 MEQ tablet TAKE 2 TABLETS BY MOUTH THREE TIMES DAILY  90 tablet  5   No current facility-administered medications on file prior to visit.    BP 136/80  Pulse 84  Temp(Src) 98.1 F (36.7 C) (Oral)  Ht 5' 6"  (1.676 m)  Wt 259 lb (117.482 kg)  BMI 41.82 kg/m2  LMP 01/30/2005     Review of Systems  Constitutional: Positive for unexpected weight change.  HENT: Negative for congestion, dental problem, hearing loss, rhinorrhea, sinus pressure, sore throat and tinnitus.   Eyes: Negative for pain, discharge and visual disturbance.  Respiratory: Positive for shortness of breath. Negative for cough.   Cardiovascular: Positive for leg swelling. Negative for chest pain and palpitations.  Gastrointestinal: Negative for nausea, vomiting, abdominal pain, diarrhea, constipation, blood in stool and abdominal distention.  Genitourinary: Negative for dysuria, urgency, frequency, hematuria, flank pain, vaginal bleeding, vaginal discharge, difficulty urinating, vaginal pain and pelvic pain.  Musculoskeletal: Negative for arthralgias, gait problem and joint swelling.  Skin: Negative for rash.  Neurological: Negative for dizziness, syncope, speech difficulty, weakness, numbness and headaches.  Hematological: Negative for adenopathy.    Psychiatric/Behavioral: Negative for behavioral problems, dysphoric mood and agitation. The patient is not nervous/anxious.        Objective:   Physical Exam  Constitutional: She is oriented to person, place, and time. She appears well-developed and well-nourished.  HENT:  Head: Normocephalic.  Right Ear: External ear normal.  Left Ear: External ear normal.  Mouth/Throat: Oropharynx is clear and moist.  Eyes: Conjunctivae and EOM are normal. Pupils are equal, round, and reactive to  light.  Neck: Normal range of motion. Neck supple. No thyromegaly present.  Cardiovascular: Normal rate, regular rhythm, normal heart sounds and intact distal pulses.   Pulmonary/Chest: Effort normal and breath sounds normal.  Abdominal: Soft. Bowel sounds are normal. She exhibits no mass. There is no tenderness.  Musculoskeletal: Normal range of motion. She exhibits edema.  Lower extremity edema, much more prominent involving the left leg  Lymphadenopathy:    She has no cervical adenopathy.  Neurological: She is alert and oriented to person, place, and time.  Skin: Skin is warm and dry. No rash noted.  Psychiatric: She has a normal mood and affect. Her behavior is normal.          Assessment & Plan:   Coronary artery calcifications.  Risk factor modification with statin therapy discussed.  Will start on Lipitor 20 mg daily Morbid Obesity Large hilar hernia Lower extremity edema History of mediastinal lymphadenopathy.  Follow chest CT has been scheduled  Recheck here in 3 months

## 2014-06-18 NOTE — Progress Notes (Signed)
Pre visit review using our clinic review tool, if applicable. No additional management support is needed unless otherwise documented below in the visit note. 

## 2014-06-18 NOTE — Patient Instructions (Signed)
Limit your sodium (Salt) intake  Return in 3 months for follow-up   

## 2014-06-25 DIAGNOSIS — K449 Diaphragmatic hernia without obstruction or gangrene: Secondary | ICD-10-CM | POA: Diagnosis not present

## 2014-06-25 DIAGNOSIS — K432 Incisional hernia without obstruction or gangrene: Secondary | ICD-10-CM | POA: Diagnosis not present

## 2014-06-26 DIAGNOSIS — M47817 Spondylosis without myelopathy or radiculopathy, lumbosacral region: Secondary | ICD-10-CM | POA: Diagnosis not present

## 2014-06-26 DIAGNOSIS — M5408 Panniculitis affecting regions of neck and back, sacral and sacrococcygeal region: Secondary | ICD-10-CM | POA: Diagnosis not present

## 2014-06-26 DIAGNOSIS — M791 Myalgia: Secondary | ICD-10-CM | POA: Diagnosis not present

## 2014-06-26 DIAGNOSIS — G894 Chronic pain syndrome: Secondary | ICD-10-CM | POA: Diagnosis not present

## 2014-06-26 DIAGNOSIS — G5790 Unspecified mononeuropathy of unspecified lower limb: Secondary | ICD-10-CM | POA: Diagnosis not present

## 2014-06-26 DIAGNOSIS — M5137 Other intervertebral disc degeneration, lumbosacral region: Secondary | ICD-10-CM | POA: Diagnosis not present

## 2014-06-26 DIAGNOSIS — R0789 Other chest pain: Secondary | ICD-10-CM | POA: Diagnosis not present

## 2014-06-26 DIAGNOSIS — M79609 Pain in unspecified limb: Secondary | ICD-10-CM | POA: Diagnosis not present

## 2014-06-29 ENCOUNTER — Encounter: Payer: Medicare Other | Attending: Surgery

## 2014-06-29 ENCOUNTER — Inpatient Hospital Stay: Admission: RE | Admit: 2014-06-29 | Payer: Self-pay | Source: Ambulatory Visit

## 2014-06-29 DIAGNOSIS — Z713 Dietary counseling and surveillance: Secondary | ICD-10-CM | POA: Diagnosis not present

## 2014-06-29 DIAGNOSIS — Z6839 Body mass index (BMI) 39.0-39.9, adult: Secondary | ICD-10-CM | POA: Insufficient documentation

## 2014-06-30 ENCOUNTER — Other Ambulatory Visit: Payer: Self-pay

## 2014-06-30 NOTE — Progress Notes (Signed)
Please put orders in Epic surgery 07-13-14 pre op 07-07-14 Thanks

## 2014-07-01 ENCOUNTER — Other Ambulatory Visit: Payer: Self-pay

## 2014-07-01 ENCOUNTER — Telehealth: Payer: Self-pay | Admitting: Dietician

## 2014-07-01 NOTE — Progress Notes (Signed)
  Pre-Operative Nutrition Class:  Appt start time: 5400   End time:  1830.  Patient was seen on 06/29/2014 for Pre-Operative Bariatric Surgery Education at the [facility].   Surgery date: 07/13/2014 Surgery type: Gastric sleeve Start weight at [facility]: 231.5 lbs on 03/18/2014 Weight today: Patient declined to be weighed  TANITA  BODY COMP RESULTS     BMI (kg/m^2)    Fat Mass (lbs)    Fat Free Mass (lbs)    Total Body Water (lbs)    Samples given per MNT protocol. Patient educated on appropriate usage: Premier protein shake (vanilla - qty 1) Lot #: [id number] Exp: 01/2015  Unjury protein powder (unflavored - qty 1) Lot #: [id number] Exp: 07/2015  Celebrate Vitamins Multivitamin (mandarin orange - qty 1) Lot #: [id number] Exp: 12/2014  PB2 Lot #: [id number] Exp: 04/2015  The following the learning objectives were met by the patient during this course:  Identify Pre-Op Dietary Goals and will begin 2 weeks pre-operatively  Identify appropriate sources of fluids and proteins   State protein recommendations and appropriate sources pre and post-operatively  Identify Post-Operative Dietary Goals and will follow for 2 weeks post-operatively  Identify appropriate multivitamin and calcium sources  Describe the need for physical activity post-operatively and will follow MD recommendations  State when to call healthcare provider regarding medication questions or post-operative complications  Handouts given during class include:  Pre-Op Bariatric Surgery Diet Handout  Protein Shake Handout  Post-Op Bariatric Surgery Nutrition Handout  BEL[facility] Program Information Flyer  Support Group Information Flyer  [facility] Bariatric Supplements Price List  Follow-Up Plan: Patient will follow-up at [facility] 2 weeks post operatively for diet advancement per MD.

## 2014-07-01 NOTE — Telephone Encounter (Signed)
Alexandria [name] called today since she was not sure what she should do for the Pre-Op diet. Was tearful on the phone. Reviewed the Pre-Op Diet and protein shakes with her. She agreed to start the diet today.

## 2014-07-04 ENCOUNTER — Encounter (HOSPITAL_COMMUNITY): Payer: Medicare Other | Admitting: Certified Registered"

## 2014-07-04 ENCOUNTER — Encounter (HOSPITAL_COMMUNITY): Payer: Self-pay | Admitting: Certified Registered"

## 2014-07-04 ENCOUNTER — Encounter (HOSPITAL_COMMUNITY): Admission: EM | Disposition: A | Discharge status: Home Health | Payer: Self-pay | Source: Home / Self Care

## 2014-07-04 ENCOUNTER — Emergency Department (HOSPITAL_COMMUNITY): Payer: Medicare Other | Admitting: Certified Registered"

## 2014-07-04 ENCOUNTER — Emergency Department (HOSPITAL_COMMUNITY): Payer: Medicare Other

## 2014-07-04 ENCOUNTER — Inpatient Hospital Stay (HOSPITAL_COMMUNITY)
Admission: EM | Admit: 2014-07-04 | Discharge: 2014-07-13 | DRG: 354 | Disposition: A | Discharge status: Home Health | Payer: Medicare Other | Attending: General Surgery | Admitting: General Surgery

## 2014-07-04 DIAGNOSIS — D509 Iron deficiency anemia, unspecified: Secondary | ICD-10-CM | POA: Diagnosis present

## 2014-07-04 DIAGNOSIS — F411 Generalized anxiety disorder: Secondary | ICD-10-CM

## 2014-07-04 DIAGNOSIS — Z853 Personal history of malignant neoplasm of breast: Secondary | ICD-10-CM

## 2014-07-04 DIAGNOSIS — K430 Incisional hernia with obstruction, without gangrene: Secondary | ICD-10-CM | POA: Diagnosis not present

## 2014-07-04 DIAGNOSIS — K5660 Unspecified intestinal obstruction: Secondary | ICD-10-CM | POA: Diagnosis not present

## 2014-07-04 DIAGNOSIS — Z9221 Personal history of antineoplastic chemotherapy: Secondary | ICD-10-CM

## 2014-07-04 DIAGNOSIS — G8929 Other chronic pain: Secondary | ICD-10-CM | POA: Diagnosis present

## 2014-07-04 DIAGNOSIS — F332 Major depressive disorder, recurrent severe without psychotic features: Secondary | ICD-10-CM | POA: Diagnosis present

## 2014-07-04 DIAGNOSIS — I890 Lymphedema, not elsewhere classified: Secondary | ICD-10-CM | POA: Diagnosis present

## 2014-07-04 DIAGNOSIS — K660 Peritoneal adhesions (postprocedural) (postinfection): Secondary | ICD-10-CM | POA: Diagnosis present

## 2014-07-04 DIAGNOSIS — Z9049 Acquired absence of other specified parts of digestive tract: Secondary | ICD-10-CM | POA: Diagnosis present

## 2014-07-04 DIAGNOSIS — R45851 Suicidal ideations: Secondary | ICD-10-CM | POA: Diagnosis present

## 2014-07-04 DIAGNOSIS — R112 Nausea with vomiting, unspecified: Secondary | ICD-10-CM | POA: Diagnosis not present

## 2014-07-04 DIAGNOSIS — J9811 Atelectasis: Secondary | ICD-10-CM | POA: Diagnosis not present

## 2014-07-04 DIAGNOSIS — K449 Diaphragmatic hernia without obstruction or gangrene: Secondary | ICD-10-CM | POA: Diagnosis present

## 2014-07-04 DIAGNOSIS — R591 Generalized enlarged lymph nodes: Secondary | ICD-10-CM | POA: Diagnosis present

## 2014-07-04 DIAGNOSIS — Z6841 Body Mass Index (BMI) 40.0 and over, adult: Secondary | ICD-10-CM

## 2014-07-04 DIAGNOSIS — F4323 Adjustment disorder with mixed anxiety and depressed mood: Secondary | ICD-10-CM

## 2014-07-04 DIAGNOSIS — K56609 Unspecified intestinal obstruction, unspecified as to partial versus complete obstruction: Secondary | ICD-10-CM

## 2014-07-04 DIAGNOSIS — Z86711 Personal history of pulmonary embolism: Secondary | ICD-10-CM | POA: Diagnosis not present

## 2014-07-04 DIAGNOSIS — I5032 Chronic diastolic (congestive) heart failure: Secondary | ICD-10-CM | POA: Diagnosis present

## 2014-07-04 DIAGNOSIS — K436 Other and unspecified ventral hernia with obstruction, without gangrene: Secondary | ICD-10-CM | POA: Diagnosis not present

## 2014-07-04 DIAGNOSIS — K089 Disorder of teeth and supporting structures, unspecified: Secondary | ICD-10-CM | POA: Diagnosis present

## 2014-07-04 DIAGNOSIS — M549 Dorsalgia, unspecified: Secondary | ICD-10-CM | POA: Diagnosis present

## 2014-07-04 DIAGNOSIS — R1084 Generalized abdominal pain: Secondary | ICD-10-CM | POA: Diagnosis not present

## 2014-07-04 DIAGNOSIS — Z923 Personal history of irradiation: Secondary | ICD-10-CM | POA: Diagnosis not present

## 2014-07-04 DIAGNOSIS — K5900 Constipation, unspecified: Secondary | ICD-10-CM | POA: Diagnosis not present

## 2014-07-04 DIAGNOSIS — R0602 Shortness of breath: Secondary | ICD-10-CM | POA: Diagnosis not present

## 2014-07-04 DIAGNOSIS — J984 Other disorders of lung: Secondary | ICD-10-CM | POA: Diagnosis not present

## 2014-07-04 DIAGNOSIS — Z95828 Presence of other vascular implants and grafts: Secondary | ICD-10-CM

## 2014-07-04 DIAGNOSIS — R590 Localized enlarged lymph nodes: Secondary | ICD-10-CM

## 2014-07-04 HISTORY — PX: PARTIAL COLECTOMY: SHX5273

## 2014-07-04 HISTORY — DX: Personal history of other diseases of the digestive system: Z87.19

## 2014-07-04 HISTORY — DX: Generalized anxiety disorder: F41.1

## 2014-07-04 HISTORY — DX: Anxiety disorder, unspecified: F41.9

## 2014-07-04 HISTORY — DX: Major depressive disorder, recurrent, unspecified: F33.9

## 2014-07-04 LAB — CBC WITH DIFFERENTIAL/PLATELET
BASOS ABS: 0 10*3/uL (ref 0.0–0.1)
Basophils Relative: 0 % (ref 0–1)
Eosinophils Absolute: 0 10*3/uL (ref 0.0–0.7)
Eosinophils Relative: 0 % (ref 0–5)
HCT: 38.1 % (ref 36.0–46.0)
Hemoglobin: 12.1 g/dL (ref 12.0–15.0)
LYMPHS ABS: 0.7 10*3/uL (ref 0.7–4.0)
LYMPHS PCT: 7 % — AB (ref 12–46)
MCH: 26.2 pg (ref 26.0–34.0)
MCHC: 31.8 g/dL (ref 30.0–36.0)
MCV: 82.5 fL (ref 78.0–100.0)
Monocytes Absolute: 0.7 10*3/uL (ref 0.1–1.0)
Monocytes Relative: 7 % (ref 3–12)
NEUTROS PCT: 86 % — AB (ref 43–77)
Neutro Abs: 8.9 10*3/uL — ABNORMAL HIGH (ref 1.7–7.7)
PLATELETS: 277 10*3/uL (ref 150–400)
RBC: 4.62 MIL/uL (ref 3.87–5.11)
RDW: 16.1 % — AB (ref 11.5–15.5)
WBC: 10.4 10*3/uL (ref 4.0–10.5)

## 2014-07-04 LAB — COMPREHENSIVE METABOLIC PANEL
ALK PHOS: 190 U/L — AB (ref 39–117)
ALT: 31 U/L (ref 0–35)
AST: 39 U/L — ABNORMAL HIGH (ref 0–37)
Albumin: 3.5 g/dL (ref 3.5–5.2)
Anion gap: 13 (ref 5–15)
BUN: 14 mg/dL (ref 6–23)
CALCIUM: 9.4 mg/dL (ref 8.4–10.5)
CO2: 30 meq/L (ref 19–32)
Chloride: 97 mEq/L (ref 96–112)
Creatinine, Ser: 0.84 mg/dL (ref 0.50–1.10)
GFR, EST AFRICAN AMERICAN: 85 mL/min — AB (ref >90)
GFR, EST NON AFRICAN AMERICAN: 74 mL/min — AB (ref >90)
GLUCOSE: 128 mg/dL — AB (ref 70–99)
POTASSIUM: 4.1 meq/L (ref 3.7–5.3)
Sodium: 140 mEq/L (ref 137–147)
TOTAL PROTEIN: 7.7 g/dL (ref 6.0–8.3)
Total Bilirubin: 0.3 mg/dL (ref 0.3–1.2)

## 2014-07-04 LAB — URINALYSIS, ROUTINE W REFLEX MICROSCOPIC
BILIRUBIN URINE: NEGATIVE
GLUCOSE, UA: NEGATIVE mg/dL
Hgb urine dipstick: NEGATIVE
Ketones, ur: NEGATIVE mg/dL
Leukocytes, UA: NEGATIVE
Nitrite: NEGATIVE
Protein, ur: 30 mg/dL — AB
SPECIFIC GRAVITY, URINE: 1.023 (ref 1.005–1.030)
UROBILINOGEN UA: 0.2 mg/dL (ref 0.0–1.0)
pH: 7 (ref 5.0–8.0)

## 2014-07-04 LAB — I-STAT CHEM 8, ED
BUN: 14 mg/dL (ref 6–23)
Calcium, Ion: 1.12 mmol/L — ABNORMAL LOW (ref 1.13–1.30)
Chloride: 98 mEq/L (ref 96–112)
Creatinine, Ser: 0.9 mg/dL (ref 0.50–1.10)
GLUCOSE: 130 mg/dL — AB (ref 70–99)
HCT: 40 % (ref 36.0–46.0)
Hemoglobin: 13.6 g/dL (ref 12.0–15.0)
POTASSIUM: 3.6 meq/L — AB (ref 3.7–5.3)
SODIUM: 139 meq/L (ref 137–147)
TCO2: 30 mmol/L (ref 0–100)

## 2014-07-04 LAB — URINE MICROSCOPIC-ADD ON

## 2014-07-04 SURGERY — COLECTOMY, PARTIAL
Anesthesia: General

## 2014-07-04 MED ORDER — FENTANYL CITRATE 0.05 MG/ML IJ SOLN
INTRAMUSCULAR | Status: AC
  Filled 2014-07-04: qty 2

## 2014-07-04 MED ORDER — MORPHINE SULFATE 4 MG/ML IJ SOLN
4.0000 mg | Freq: Once | INTRAMUSCULAR | Status: AC
  Administered 2014-07-04: 4 mg via INTRAVENOUS
  Filled 2014-07-04: qty 1

## 2014-07-04 MED ORDER — DEXTROSE 5 % IV SOLN
2.0000 g | Freq: Once | INTRAVENOUS | Status: AC
  Administered 2014-07-04: 2 g via INTRAVENOUS
  Filled 2014-07-04: qty 2

## 2014-07-04 MED ORDER — ROCURONIUM BROMIDE 100 MG/10ML IV SOLN
INTRAVENOUS | Status: AC
  Filled 2014-07-04: qty 1

## 2014-07-04 MED ORDER — PROPOFOL 10 MG/ML IV BOLUS
INTRAVENOUS | Status: AC
  Filled 2014-07-04: qty 20

## 2014-07-04 MED ORDER — MIDAZOLAM HCL 5 MG/5ML IJ SOLN
INTRAMUSCULAR | Status: DC | PRN
  Administered 2014-07-04: 2 mg via INTRAVENOUS

## 2014-07-04 MED ORDER — SODIUM CHLORIDE 0.9 % IV BOLUS (SEPSIS)
500.0000 mL | Freq: Once | INTRAVENOUS | Status: AC
  Administered 2014-07-04: 500 mL via INTRAVENOUS

## 2014-07-04 MED ORDER — HEPARIN SODIUM (PORCINE) 5000 UNIT/ML IJ SOLN
5000.0000 [IU] | Freq: Once | INTRAMUSCULAR | Status: DC
  Filled 2014-07-04: qty 1

## 2014-07-04 MED ORDER — ONDANSETRON HCL 4 MG/2ML IJ SOLN
4.0000 mg | Freq: Once | INTRAMUSCULAR | Status: AC
  Administered 2014-07-04: 4 mg via INTRAVENOUS
  Filled 2014-07-04: qty 2

## 2014-07-04 MED ORDER — LACTATED RINGERS IV SOLN
INTRAVENOUS | Status: DC | PRN
  Administered 2014-07-04 – 2014-07-05 (×2): via INTRAVENOUS

## 2014-07-04 MED ORDER — MIDAZOLAM HCL 2 MG/2ML IJ SOLN
INTRAMUSCULAR | Status: AC
  Filled 2014-07-04: qty 2

## 2014-07-04 MED ORDER — 0.9 % SODIUM CHLORIDE (POUR BTL) OPTIME
TOPICAL | Status: DC | PRN
  Administered 2014-07-04: 2000 mL

## 2014-07-04 MED ORDER — ONDANSETRON HCL 4 MG/2ML IJ SOLN
INTRAMUSCULAR | Status: AC
  Filled 2014-07-04: qty 2

## 2014-07-04 SURGICAL SUPPLY — 62 items
APPLICATOR COTTON TIP 6IN STRL (MISCELLANEOUS) IMPLANT
BINDER ABDOMINAL 12 ML 46-62 (SOFTGOODS) ×3 IMPLANT
BLADE EXTENDED COATED 6.5IN (ELECTRODE) IMPLANT
BLADE HEX COATED 2.75 (ELECTRODE) IMPLANT
BLADE SURG SZ10 CARB STEEL (BLADE) IMPLANT
CANISTER SUCTION 2500CC (MISCELLANEOUS) IMPLANT
CLIP TI LARGE 6 (CLIP) IMPLANT
COVER MAYO STAND STRL (DRAPES) ×3 IMPLANT
DRAIN CHANNEL 19F RND (DRAIN) ×3 IMPLANT
DRAPE LAPAROSCOPIC ABDOMINAL (DRAPES) ×3 IMPLANT
DRAPE SHEET LG 3/4 BI-LAMINATE (DRAPES) IMPLANT
DRAPE WARM FLUID 44X44 (DRAPE) ×3 IMPLANT
DRSG PAD ABDOMINAL 8X10 ST (GAUZE/BANDAGES/DRESSINGS) IMPLANT
ELECT REM PT RETURN 9FT ADLT (ELECTROSURGICAL) ×3
ELECTRODE REM PT RTRN 9FT ADLT (ELECTROSURGICAL) ×1 IMPLANT
ENSEAL DEVICE STD TIP 35CM (ENDOMECHANICALS) IMPLANT
EVACUATOR SILICONE 100CC (DRAIN) ×3 IMPLANT
GAUZE SPONGE 4X4 12PLY STRL (GAUZE/BANDAGES/DRESSINGS) ×3 IMPLANT
GLOVE BIOGEL PI IND STRL 7.0 (GLOVE) ×2 IMPLANT
GLOVE BIOGEL PI IND STRL 7.5 (GLOVE) ×2 IMPLANT
GLOVE BIOGEL PI INDICATOR 7.0 (GLOVE) ×4
GLOVE BIOGEL PI INDICATOR 7.5 (GLOVE) ×4
GLOVE SS BIOGEL STRL SZ 7.5 (GLOVE) ×2 IMPLANT
GLOVE SUPERSENSE BIOGEL SZ 7.5 (GLOVE) ×4
GOWN STRL REUS W/TWL LRG LVL3 (GOWN DISPOSABLE) IMPLANT
GOWN STRL REUS W/TWL XL LVL3 (GOWN DISPOSABLE) ×6 IMPLANT
HOLDER FOLEY CATH W/STRAP (MISCELLANEOUS) ×3 IMPLANT
KIT BASIN OR (CUSTOM PROCEDURE TRAY) ×3 IMPLANT
LEGGING LITHOTOMY PAIR STRL (DRAPES) IMPLANT
LIGASURE IMPACT 36 18CM CVD LR (INSTRUMENTS) IMPLANT
MANIFOLD NEPTUNE II (INSTRUMENTS) ×3 IMPLANT
MESH PROLENE 3X6 (Mesh General) ×3 IMPLANT
NS IRRIG 1000ML POUR BTL (IV SOLUTION) ×6 IMPLANT
PACK GENERAL/GYN (CUSTOM PROCEDURE TRAY) ×3 IMPLANT
PAD ABD 8X10 STRL (GAUZE/BANDAGES/DRESSINGS) ×3 IMPLANT
SEALER TISSUE G2 CVD JAW 35 (ENDOMECHANICALS) IMPLANT
SEALER TISSUE G2 CVD JAW 45CM (ENDOMECHANICALS)
SHEARS HARMONIC ACE PLUS 36CM (ENDOMECHANICALS) IMPLANT
SPONGE DRAIN TRACH 4X4 STRL 2S (GAUZE/BANDAGES/DRESSINGS) ×3 IMPLANT
SPONGE LAP 18X18 X RAY DECT (DISPOSABLE) ×3 IMPLANT
STAPLER VISISTAT 35W (STAPLE) ×3 IMPLANT
SUCTION POOLE TIP (SUCTIONS) ×3 IMPLANT
SUT ETHILON 2 0 PS N (SUTURE) ×3 IMPLANT
SUT NOV 1 T60/GS (SUTURE) IMPLANT
SUT NOVA NAB DX-16 0-1 5-0 T12 (SUTURE) ×9 IMPLANT
SUT NOVA NAB GS-21 0 18 T12 DT (SUTURE) ×6 IMPLANT
SUT NOVA T20/GS 25 (SUTURE) IMPLANT
SUT PDS AB 1 TP1 96 (SUTURE) IMPLANT
SUT SILK 2 0 (SUTURE) ×2
SUT SILK 2 0 SH CR/8 (SUTURE) ×3 IMPLANT
SUT SILK 2 0SH CR/8 30 (SUTURE) IMPLANT
SUT SILK 2-0 18XBRD TIE 12 (SUTURE) ×1 IMPLANT
SUT SILK 2-0 30XBRD TIE 12 (SUTURE) IMPLANT
SUT SILK 3 0 (SUTURE) ×2
SUT SILK 3 0 SH CR/8 (SUTURE) ×3 IMPLANT
SUT SILK 3-0 18XBRD TIE 12 (SUTURE) ×1 IMPLANT
SUT VIC AB 3-0 CT1 36 (SUTURE) ×3 IMPLANT
TAPE CLOTH SURG 4X10 WHT LF (GAUZE/BANDAGES/DRESSINGS) ×3 IMPLANT
TOWEL OR 17X26 10 PK STRL BLUE (TOWEL DISPOSABLE) ×3 IMPLANT
TRAY FOLEY CATH 14FRSI W/METER (CATHETERS) ×3 IMPLANT
WATER STERILE IRR 1500ML POUR (IV SOLUTION) IMPLANT
YANKAUER SUCT BULB TIP NO VENT (SUCTIONS) ×3 IMPLANT

## 2014-07-04 NOTE — ED Notes (Signed)
Pt aware that urine sample is needed, Unable to void at this time

## 2014-07-04 NOTE — Anesthesia Preprocedure Evaluation (Addendum)
Anesthesia Evaluation  Patient identified by MRN, date of birth, ID band Patient awake    Reviewed: Allergy & Precautions, H&P , NPO status , Patient's Chart, lab work & pertinent test results  Airway Mallampati: III TM Distance: <3 FB Neck ROM: Full    Dental no notable dental hx.    Pulmonary neg pulmonary ROS,  breath sounds clear to auscultation  Pulmonary exam normal       Cardiovascular hypertension, + DOE + Valvular Problems/Murmurs Rhythm:Regular Rate:Normal  Left ventricle: E/e&'>15 suggestive of elevated LV filling   pressures. The cavity size was normal. Systolic function was   normal. The estimated ejection fraction was in the range of 55%   to 60%. Wall motion was normal; there were no regional wall   motion abnormalities. There was an increased relative   contribution of atrial contraction to ventricular filling.   Doppler parameters are consistent with abnormal left ventricular   relaxation (grade 1 diastolic dysfunction). - Tricuspid valve: There was trivial regurgitation   Neuro/Psych Anxiety negative neurological ROS     GI/Hepatic negative GI ROS, Neg liver ROS,   Endo/Other  Morbid obesity  Renal/GU negative Renal ROS  negative genitourinary   Musculoskeletal negative musculoskeletal ROS (+)   Abdominal   Peds negative pediatric ROS (+)  Hematology negative hematology ROS (+)   Anesthesia Other Findings   Reproductive/Obstetrics negative OB ROS                          Anesthesia Physical Anesthesia Plan  ASA: III and emergent  Anesthesia Plan: General   Post-op Pain Management:    Induction: Intravenous, Rapid sequence and Cricoid pressure planned  Airway Management Planned: Oral ETT  Additional Equipment:   Intra-op Plan:   Post-operative Plan: Extubation in OR  Informed Consent: I have reviewed the patients History and Physical, chart, labs and discussed  the procedure including the risks, benefits and alternatives for the proposed anesthesia with the patient or authorized representative who has indicated his/her understanding and acceptance.   Dental advisory given  Plan Discussed with: CRNA and Surgeon  Anesthesia Plan Comments:         Anesthesia Quick Evaluation

## 2014-07-04 NOTE — ED Provider Notes (Signed)
Medical screening examination/treatment/procedure(s) were performed by non-physician practitioner and as supervising physician I was immediately available for consultation/collaboration.   EKG Interpretation None       Charlesetta Shanks, MD 07/04/14 [phone]

## 2014-07-04 NOTE — ED Notes (Signed)
Per pt, states vomiting and abdominal pain for 2 days-scheduled for hiatal hernia surgery on Mon

## 2014-07-04 NOTE — ED Notes (Signed)
Patient is requesting the doctor to prescribe ativan. Dr. Ree Kida.

## 2014-07-04 NOTE — H&P (Signed)
Alexandria [name] is an 62 y.o. female.    Chief Complaint: Abdominal pain, nausea and vomiting  HPI: Patient is a 63 year old female with morbid obesity and previous history of partial colectomy and laparotomy for small bowel obstruction. She has a known ventral hernia. She has a large hiatal hernia it is actually scheduled for sleeve gastrectomy, repair of hiatal hernia and repair of ventral hernia in 1 week. She presents to the emergency room with 48 hours of worsening generalized abdominal pain centrally located in the abdomen persistent nausea and vomiting. She has noted some increased swelling at the site of her umbilical hernia.  Past Medical History  Diagnosis Date  . Iron deficiency anemia   . Status post chemotherapy     4 cycles of Taxotere and cytoxan  . Chronic back pain   . Hx of pulmonary embolus     During c-Section  . Neuropathy   . Obesity     Class 2  . S/P radiation therapy  03/04/2013-04/17/2013    1) Right breast / 50 Gy in 25 fractions/ 2) Right breast boost / 10 Gy in 5 fractions  . Diastolic heart failure   . Hx of radiation therapy 03/04/13- 04/17/13    right breast 50 Gy 25 fractions, right breast boost 10 Gy 5 fractions  . 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 Dyspnea on exertion     Past Surgical History  Procedure Laterality Date  . Cesarean section    . Right breast lumpectomy      Family History  Problem Relation Age of Onset  . Parkinson's disease Mother   . Emphysema Maternal Grandfather   . COPD Sister    Social History:  reports that she has never smoked. She has never used smokeless tobacco. She reports that she does not drink alcohol or use illicit drugs.  Allergies:  Allergies  Allergen Reactions  . Contrast Media [Iodinated Diagnostic Agents] Anaphylaxis  . Albuterol Other (See Comments)    Anxious   . Prednisone Other (See Comments)     Pt gets very agitated when she takes high doses of steroids    Current Facility-Administered Medications  Medication Dose Route Frequency Provider Last Rate Last Dose  . cefOXitin (MEFOXIN) 2 g in dextrose 5 % 50 mL IVPB  2 g Intravenous Once Excell Seltzer, MD      . heparin injection 5,000 Units  5,000 Units Subcutaneous Once Excell Seltzer, MD       Current Outpatient Prescriptions  Medication Sig Dispense Refill  . albuterol (PROVENTIL HFA;VENTOLIN HFA) 108 (90 BASE) MCG/ACT inhaler Inhale 2 puffs into the lungs every 6 (six) hours as needed for wheezing or shortness of breath (wheezing).      Marland Kitchen anastrozole (ARIMIDEX) 1 MG tablet Take 1 mg by mouth daily.      Marland Kitchen atorvastatin (LIPITOR) 20 MG tablet Take 1 tablet (20 mg total) by mouth daily.  90 tablet  3  . budesonide (PULMICORT) 180 MCG/ACT inhaler Inhale 2 puffs into the lungs daily as needed (shortness of breath).      . escitalopram (LEXAPRO) 20 MG tablet Take 1 tablet (20 mg total) by mouth daily.  90 tablet  3  . ferrous sulfate 325 (65 FE) MG tablet Take 325 mg by mouth daily with breakfast.      . furosemide (LASIX) 80 MG tablet Take  80 mg by mouth 3 (three) times daily.      Marland Kitchen HYDROmorphone (DILAUDID) 4 MG tablet Take 4 mg by mouth 3 (three) times daily as needed for severe pain (pain).       Marland Kitchen HYDROmorphone HCl (EXALGO) 12 MG T24A SR tablet Take 12 mg by mouth 2 (two) times daily.      Marland Kitchen levalbuterol (XOPENEX) 0.63 MG/3ML nebulizer solution Take 0.63 mg by nebulization every 4 (four) hours as needed for wheezing or shortness of breath (shortness of breath).      . lidocaine (LIDODERM) 5 % Place 1-2 patches onto the skin daily. Remove & Discard patch within 12 hours or as directed by MD      . lidocaine-prilocaine (EMLA) cream Apply 1 application topically as needed (port access).       . LORazepam (ATIVAN) 0.5 MG tablet Take 0.5 mg by mouth 2 (two) times daily as needed for anxiety (anxiety).      . lubiprostone  (AMITIZA) 24 MCG capsule Take 1 capsule (24 mcg total) by mouth 2 (two) times daily with a meal.  60 capsule  0  . Multiple Vitamins-Minerals (HAIR/SKIN/NAILS PO) Take 1 tablet by mouth every morning.      . potassium chloride (KLOR-CON) 20 MEQ packet Take 40 mEq by mouth 3 (three) times daily.         Results for orders placed during the hospital encounter of 07/04/14 (from the past 48 hour(s))  CBC WITH DIFFERENTIAL     Status: Abnormal   Collection Time    07/04/14  7:28 PM      Result Value Ref Range   WBC 10.4  4.0 - 10.5 K/uL   RBC 4.62  3.87 - 5.11 MIL/uL   Hemoglobin 12.1  12.0 - 15.0 g/dL   HCT 38.1  36.0 - 46.0 %   MCV 82.5  78.0 - 100.0 fL   MCH 26.2  26.0 - 34.0 pg   MCHC 31.8  30.0 - 36.0 g/dL   RDW 16.1 (*) 11.5 - 15.5 %   Platelets 277  150 - 400 K/uL   Neutrophils Relative % 86 (*) 43 - 77 %   Neutro Abs 8.9 (*) 1.7 - 7.7 K/uL   Lymphocytes Relative 7 (*) 12 - 46 %   Lymphs Abs 0.7  0.7 - 4.0 K/uL   Monocytes Relative 7  3 - 12 %   Monocytes Absolute 0.7  0.1 - 1.0 K/uL   Eosinophils Relative 0  0 - 5 %   Eosinophils Absolute 0.0  0.0 - 0.7 K/uL   Basophils Relative 0  0 - 1 %   Basophils Absolute 0.0  0.0 - 0.1 K/uL  COMPREHENSIVE METABOLIC PANEL     Status: Abnormal   Collection Time    07/04/14  7:28 PM      Result Value Ref Range   Sodium 140  137 - 147 mEq/L   Potassium 4.1  3.7 - 5.3 mEq/L   Chloride 97  96 - 112 mEq/L   CO2 30  19 - 32 mEq/L   Glucose, Bld 128 (*) 70 - 99 mg/dL   BUN 14  6 - 23 mg/dL   Creatinine, Ser 0.84  0.50 - 1.10 mg/dL   Calcium 9.4  8.4 - 10.5 mg/dL   Total Protein 7.7  6.0 - 8.3 g/dL   Albumin 3.5  3.5 - 5.2 g/dL   AST 39 (*) 0 - 37 U/L   Comment: SLIGHT HEMOLYSIS  HEMOLYSIS AT THIS LEVEL MAY AFFECT RESULT   ALT 31  0 - 35 U/L   Alkaline Phosphatase 190 (*) 39 - 117 U/L   Total Bilirubin 0.3  0.3 - 1.2 mg/dL   GFR calc non Af Amer 74 (*) >90 mL/min   GFR calc Af Amer 85 (*) >90 mL/min   Comment: (NOTE)     The  eGFR has been calculated using the CKD EPI equation.     This calculation has not been validated in all clinical situations.     eGFR's persistently <90 mL/min signify possible Chronic Kidney     Disease.   Anion gap 13  5 - 15  I-STAT CHEM 8, ED     Status: Abnormal   Collection Time    07/04/14  7:45 PM      Result Value Ref Range   Sodium 139  137 - 147 mEq/L   Potassium 3.6 (*) 3.7 - 5.3 mEq/L   Chloride 98  96 - 112 mEq/L   BUN 14  6 - 23 mg/dL   Creatinine, Ser 0.90  0.50 - 1.10 mg/dL   Glucose, Bld 130 (*) 70 - 99 mg/dL   Calcium, Ion 1.12 (*) 1.13 - 1.30 mmol/L   TCO2 30  0 - 100 mmol/L   Hemoglobin 13.6  12.0 - 15.0 g/dL   HCT 40.0  36.0 - 46.0 %  URINALYSIS, ROUTINE W REFLEX MICROSCOPIC     Status: Abnormal   Collection Time    07/04/14  9:13 PM      Result Value Ref Range   Color, Urine YELLOW  YELLOW   APPearance CLEAR  CLEAR   Specific Gravity, Urine 1.023  1.005 - 1.030   pH 7.0  5.0 - 8.0   Glucose, UA NEGATIVE  NEGATIVE mg/dL   Hgb urine dipstick NEGATIVE  NEGATIVE   Bilirubin Urine NEGATIVE  NEGATIVE   Ketones, ur NEGATIVE  NEGATIVE mg/dL   Protein, ur 30 (*) NEGATIVE mg/dL   Urobilinogen, UA 0.2  0.0 - 1.0 mg/dL   Nitrite NEGATIVE  NEGATIVE   Leukocytes, UA NEGATIVE  NEGATIVE  URINE MICROSCOPIC-ADD ON     Status: None   Collection Time    07/04/14  9:13 PM      Result Value Ref Range   Urine-Other MUCOUS PRESENT     Ct Abdomen Pelvis Wo Contrast  07/04/2014   CLINICAL DATA:  Vomiting and abdominal pain for 2 days, initial evaluation, concern for obstruction  EXAM: CT ABDOMEN AND PELVIS WITHOUT CONTRAST  TECHNIQUE: Multidetector CT imaging of the abdomen and pelvis was performed following the standard protocol without IV contrast.  COMPARISON:  04/10/2014, 01/21/2014  FINDINGS: Rounded atelectasis posterior right lung base stable from 01/21/2014. Known large hiatal hernia.  Fatty infiltration of the liver. Gallbladder normal. Spleen normal. Pancreas normal.   Adrenal glands normal.  Left kidney normal.  Right kidney normal.  Mild calcification of the abdominal aorta. Bladder normal. Reproductive organs not identified.  The stomach is relatively decompressed. There are numerous loops of dilated small bowel demonstrating air-fluid levels. These are dilated up to about 4 cm. Caliber transition appears to occur near the junction of the jejunum and ileum, and appears to be related to a small periumbilical hernia.  No acute musculoskeletal findings.  IMPRESSION: Distal small bowel obstruction likely related to periumbilical hernia.  Stable rounded atelectasis right lung base. Known large hiatal hernia.   Electronically Signed   By: Elodia Florence.D.  On: 07/04/2014 21:15    Review of Systems  Constitutional: Negative for fever and chills.  Respiratory: Positive for shortness of breath and wheezing. Negative for cough, hemoptysis and sputum production.   Cardiovascular: Positive for leg swelling. Negative for chest pain and palpitations.  Gastrointestinal: Positive for nausea, vomiting and abdominal pain. Negative for diarrhea, constipation and blood in stool.  Musculoskeletal: Positive for joint pain.  Neurological: Negative.   Psychiatric/Behavioral: The patient is nervous/anxious.     Blood pressure 179/92, pulse 106, temperature 98.8 F (37.1 C), temperature source Oral, resp. rate 18, height _0  (1.727 m), weight 229 lb (103.874 kg), last menstrual period 01/30/2005, SpO2 94.00%. Physical Exam  General: Alert, Anxious morbidly obese Caucasian female in mild distress Skin: Warm and dry without rash or infection. HEENT: No palpable masses or thyromegaly. Sclera nonicteric. Pupils equal round and reactive. Oropharynx clear. Lymph nodes: No cervical, supraclavicular, axillary or inguinal nodes palpable. Breasts: Well-healed lumpectomy right breast. No masses either breast. Lungs: Minimal wheezing bilaterally. Somewhat anxious but no significant  increased work of breathing. Cardiovascular: Regular rate and rhythm without murmur.  Abdomen: Obese and moderately distended.. Lower midline and periumbilical incision with tender mass palpable at the umbilicus. Mild diffuse tenderness. Extremities: 2-3+ bilateral lower extremity edema with chronic venous stasis changes. Neurologic: Alert and fully oriented. Gait normal  Assessment/Plan Incarcerated incisional hernia with small bowel obstruction. She is very tender and there may be compromise of the bowel. She will require emergency repair. Morbid obesity Lymphedema Remote history of pulmonary embolus-receiving preoperative heparin History of dyspnea on exertion and recent pulmonary clearance for bariatric surgery  Recent cardiac evaluation, history diastolic heart failure but recent normal active I discussed the indications and nature of the surgery with the patient and her husband. We discussed risks of anesthesia, cardiorespiratory complications, bleeding, infection and wound healing problems. All questions were answered and she agrees to proceed.  Yomaira Solar T 07/04/2014, 11:03 PM

## 2014-07-04 NOTE — ED Provider Notes (Signed)
CSN: [id number]     Arrival date & time 07/04/14  1726 History   First MD Initiated Contact with Patient 07/04/14 1814     Chief Complaint  Patient presents with  . Emesis  . Abdominal Pain     (Consider location/radiation/quality/duration/timing/severity/associated sxs/prior Treatment) HPI Comments: Alexandria [name] is a 63 y.o. female with a PMHx of SBO, hiatal hernia, iron def anemia, breast cancer s/p radiation and chemo, chronic back pain, hx of PE after C-section, diastolic heart failure, and dyspnea on exertion, who presents to the ED with complaints of generalized abd pain x2 days. Pain is 10/10 constant, squeezing sharp, nonradiating, worse with movement, and unchanged with home narcotics. States this feels like prior episodes of SBO. Associated symptoms include nonbloody nonbilious emesis x2 days, unable to keep food or liquids down. Reports having infrequent hard stools, states they're "small hard round balls", last BM yesterday but prior to that she hadn't had a BM in 2-3 days. States she tried to get her bowels to move with OTC laxatives without relief. Endorses having a fever intermittently over the last 2 days, highest temp 101F oral. No flatus in "a few days", but states this is not uncommon for her. Also endorses belching frequently and feeling distended. Scheduled to have hiatal hernia repair surgery on Monday. Denies CP, SOB, cough, hematochezia, melena, hematemesis, coffee ground emesis, diarrhea, dysuria, hematuria, vaginal bleeding/discharge, lightheadedness, dizziness, myalgias, arthralgias, back pain/flank pain, or other symptoms.   Patient is a 63 y.o. female presenting with abdominal pain. The history is provided by the patient. No language interpreter was used.  Abdominal Pain Pain location:  Generalized Pain quality: sharp and squeezing   Pain radiates to:  Does not radiate Pain severity:  Severe (10/10) Onset quality:  Gradual Duration:  2 days Timing:   Constant Progression:  Worsening Chronicity:  Recurrent Context: not recent travel, not sick contacts and not suspicious food intake   Relieved by:  Nothing Worsened by:  Movement Ineffective treatments: home narcotics. Associated symptoms: belching, constipation, fever, nausea and vomiting   Associated symptoms: no anorexia, no chest pain, no chills, no cough, no diarrhea, no dysuria, no flatus, no hematemesis, no hematochezia, no hematuria, no melena, no shortness of breath, no vaginal bleeding and no vaginal discharge   Risk factors: obesity   Risk factors: no alcohol abuse and no NSAID use     Past Medical History  Diagnosis Date  . Iron deficiency anemia   . Status post chemotherapy     4 cycles of Taxotere and cytoxan  . Chronic back pain   . Hx of pulmonary embolus     During c-Section  . Neuropathy   . Obesity     Class 2  . S/P radiation therapy  03/04/2013-04/17/2013    1) Right breast / 50 Gy in 25 fractions/ 2) Right breast boost / 10 Gy in 5 fractions  . Diastolic heart failure   . Hx of radiation therapy 03/04/13- 04/17/13    right breast 50 Gy 25 fractions, right breast boost 10 Gy 5 fractions  . 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 Dyspnea on exertion    Past Surgical History  Procedure Laterality Date  . Cesarean section    . Right breast lumpectomy     Family History  Problem Relation Age of Onset  . Parkinson's disease Mother   .  Emphysema Maternal Grandfather   . COPD Sister    History  Substance Use Topics  . Smoking status: Never Smoker   . Smokeless tobacco: Never Used  . Alcohol Use: No   OB History   Grav Para Term Preterm Abortions TAB SAB Ect Mult Living                 Review of Systems  Constitutional: Positive for fever. Negative for chills.  HENT: Negative for congestion and rhinorrhea.   Respiratory: Negative for cough and  shortness of breath.   Cardiovascular: Negative for chest pain.  Gastrointestinal: Positive for nausea, vomiting, abdominal pain, constipation and abdominal distention. Negative for diarrhea, blood in stool, melena, hematochezia, rectal pain, anorexia, flatus and hematemesis.  Genitourinary: Negative for dysuria, urgency, frequency, hematuria, flank pain, vaginal bleeding, vaginal discharge, difficulty urinating and pelvic pain.  Musculoskeletal: Negative for arthralgias, back pain and myalgias.  Skin: Negative for color change.  Neurological: Negative for dizziness, syncope, weakness, light-headedness and numbness.  Psychiatric/Behavioral: Negative for confusion.   10 Systems reviewed and are negative for acute change except as noted in the HPI.    Allergies  Contrast media; Albuterol; and Prednisone  Home Medications   Prior to Admission medications   Medication Sig Start Date End Date Taking? Authorizing Provider  albuterol (PROAIR HFA) 108 (90 BASE) MCG/ACT inhaler Inhale 2 puffs into the lungs every 4 (four) hours as needed. 01/14/14   Marletta Lor, MD  anastrozole (ARIMIDEX) 1 MG tablet TAKE 1 TABLET BY MOUTH DAILY 05/26/14   Heath Lark, MD  atorvastatin (LIPITOR) 20 MG tablet Take 1 tablet (20 mg total) by mouth daily. 06/18/14   Marletta Lor, MD  budesonide (PULMICORT) 180 MCG/ACT inhaler Inhale 2 puffs into the lungs 2 (two) times daily. 08/26/13   Marletta Lor, MD  escitalopram (LEXAPRO) 20 MG tablet Take 1 tablet (20 mg total) by mouth daily. 01/22/14   Heath Lark, MD  ferrous sulfate 325 (65 FE) MG tablet Take 325 mg by mouth daily with breakfast. 11/20/13   Kinnie Feil, MD  furosemide (LASIX) 80 MG tablet TAKE 1 TABLET BY MOUTH TWICE DAILY, MAY TAKE AN EXTRA TABLET IF NEEDED FOR INCREASED SWELLING OR RAPID WEIGHT GAIN 05/26/14   Marletta Lor, MD  HYDROmorphone (DILAUDID) 4 MG tablet Take 4 mg by mouth 3 (three) times daily as needed (pain).     Historical  Provider, MD  HYDROmorphone HCl (EXALGO) 12 MG T24A SR tablet Take 12 mg by mouth 2 (two) times daily.    Historical Provider, MD  levalbuterol Penne Lash) 0.63 MG/3ML nebulizer solution Take 3 mLs (0.63 mg total) by nebulization every 4 (four) hours as needed for wheezing or shortness of breath. 08/26/13   Marletta Lor, MD  lidocaine (LIDODERM) 5 % Place 1-3 patches onto the skin daily. Remove & Discard patch within 12 hours or as directed by MD    Historical Provider, MD  lidocaine-prilocaine (EMLA) cream Apply 1 application topically as needed (port access).     Historical Provider, MD  LORazepam (ATIVAN) 0.5 MG tablet Take 1 tablet (0.5 mg total) by mouth 2 (two) times daily as needed for anxiety. 04/23/14   Marletta Lor, MD  lubiprostone (AMITIZA) 24 MCG capsule Take 1 capsule (24 mcg total) by mouth 2 (two) times daily with a meal. 04/03/14   Heath Lark, MD  Multiple Vitamins-Minerals (HAIR/SKIN/NAILS PO) Take 1 tablet by mouth every morning.    Historical Provider, MD  pantoprazole (PROTONIX) 40 MG tablet Take 1 tablet (40 mg total) by mouth daily. 11/20/13   Kinnie Feil, MD  potassium chloride SA (K-DUR,KLOR-CON) 20 MEQ tablet TAKE 2 TABLETS BY MOUTH THREE TIMES DAILY 05/26/14   Marletta Lor, MD   BP 159/97  Pulse 97  Temp(Src) 97.9 F (36.6 C) (Oral)  Resp 18  Ht 5' 8"  (1.727 m)  Wt 229 lb (103.874 kg)  BMI 34.83 kg/m2  SpO2 95%  LMP 01/30/2005 Physical Exam  Nursing note and vitals reviewed. Constitutional: She is oriented to person, place, and time. Vital signs are normal. She appears well-developed and well-nourished. She appears distressed (in pain).  Afebrile, VSS, in severe pain  HENT:  Head: Normocephalic and atraumatic.  Mouth/Throat: Oropharynx is clear and moist and mucous membranes are normal.  Eyes: Conjunctivae and EOM are normal. Right eye exhibits no discharge. Left eye exhibits no discharge.  Neck: Normal range of motion. Neck supple.   Cardiovascular: Normal rate, regular rhythm, normal heart sounds and intact distal pulses.  Exam reveals no gallop and no friction rub.   No murmur heard. Pulmonary/Chest: Effort normal and breath sounds normal. No respiratory distress. She has no decreased breath sounds. She has no wheezes. She has no rhonchi. She has no rales.  CTAB in all lung fields, speaking in full sentences, oxygenation 95% on RA  Abdominal: She exhibits distension. Bowel sounds are decreased. There is generalized tenderness. There is no rigidity, no rebound, no guarding, no CVA tenderness, no tenderness at McBurney's point and negative Murphy's sign.  Obese, distended, with decreased BS in LUQ but otherwise +BS in remaining quadrants, diffusely TTP generalized throughout, no r/g/r but abd is very tight with distension. Neg murphy's, neg mcburney's, no CVA TTP  Musculoskeletal: Normal range of motion.  Neurological: She is alert and oriented to person, place, and time. She has normal strength. No sensory deficit.  Skin: Skin is warm, dry and intact. No rash noted.  Psychiatric: She has a normal mood and affect.    ED Course  Procedures (including critical care time) Labs Review Labs Reviewed  CBC WITH DIFFERENTIAL - Abnormal; Notable for the following:    RDW 16.1 (*)    Neutrophils Relative % 86 (*)    Neutro Abs 8.9 (*)    Lymphocytes Relative 7 (*)    All other components within normal limits  COMPREHENSIVE METABOLIC PANEL - Abnormal; Notable for the following:    Glucose, Bld 128 (*)    AST 39 (*)    Alkaline Phosphatase 190 (*)    GFR calc non Af Amer 74 (*)    GFR calc Af Amer 85 (*)    All other components within normal limits  URINALYSIS, ROUTINE W REFLEX MICROSCOPIC - Abnormal; Notable for the following:    Protein, ur 30 (*)    All other components within normal limits  I-STAT CHEM 8, ED - Abnormal; Notable for the following:    Potassium 3.6 (*)    Glucose, Bld 130 (*)    Calcium, Ion 1.12 (*)     All other components within normal limits  URINE MICROSCOPIC-ADD ON  LIPASE, BLOOD  I-STAT CG4 LACTIC ACID, ED    Imaging Review Ct Abdomen Pelvis Wo Contrast  07/04/2014   CLINICAL DATA:  Vomiting and abdominal pain for 2 days, initial evaluation, concern for obstruction  EXAM: CT ABDOMEN AND PELVIS WITHOUT CONTRAST  TECHNIQUE: Multidetector CT imaging of the abdomen and pelvis was performed following the standard  protocol without IV contrast.  COMPARISON:  04/10/2014, 01/21/2014  FINDINGS: Rounded atelectasis posterior right lung base stable from 01/21/2014. Known large hiatal hernia.  Fatty infiltration of the liver. Gallbladder normal. Spleen normal. Pancreas normal.  Adrenal glands normal.  Left kidney normal.  Right kidney normal.  Mild calcification of the abdominal aorta. Bladder normal. Reproductive organs not identified.  The stomach is relatively decompressed. There are numerous loops of dilated small bowel demonstrating air-fluid levels. These are dilated up to about 4 cm. Caliber transition appears to occur near the junction of the jejunum and ileum, and appears to be related to a small periumbilical hernia.  No acute musculoskeletal findings.  IMPRESSION: Distal small bowel obstruction likely related to periumbilical hernia.  Stable rounded atelectasis right lung base. Known large hiatal hernia.   Electronically Signed   By: Skipper Cliche M.D.   On: 07/04/2014 21:15     EKG Interpretation None      MDM   Final diagnoses:  Small bowel obstruction    63y/o female with PMHx of SBO here with abd pain and n/v x2 days, abd exam concerning for SBO, will obtain labs and give pain/nausea meds and perform CT w/o contrast since pt has allergies to dye. Will reassess shortly. Will give gentle fluids now, given hx of cardiac issues.  10:10 PM CBC w/diff relatively WNL, CMP showing elevated alk phos at 190 consistent with prior elevations, GFR at baseline. U/A relatively unremarkable.  CT showing SBO at jejunal/ileum junction, surgery consulted and Dr. Excell Seltzer returning page, will come see pt. Pt declines NG tube at this time and would like to see the surgeon before she agrees to anything further. More pain meds given at this time. Pt remains afebrile and with stable VS although is having oxygen saturation at mid 90's, which is consistent with prior ED visits. Pt states she's having ongoing management of her lung issues, and is not having any SOB today. Pt to be admitted by surgery service. Stable at this time.    Meds ordered this encounter  Medications  . morphine 4 MG/ML injection 4 mg    Sig:   . ondansetron (ZOFRAN) injection 4 mg    Sig:   . sodium chloride 0.9 % bolus 500 mL    Sig:   . morphine 4 MG/ML injection 4 mg    Sig:      Patty Sermons Camprubi-Soms, PA-C 07/04/14 2311

## 2014-07-04 NOTE — ED Notes (Signed)
Requested urine from Patient. Patient is not able to urinate but is aware that it is needed.

## 2014-07-05 ENCOUNTER — Inpatient Hospital Stay (HOSPITAL_COMMUNITY): Payer: Medicare Other

## 2014-07-05 ENCOUNTER — Encounter (HOSPITAL_COMMUNITY): Payer: Self-pay | Admitting: *Deleted

## 2014-07-05 DIAGNOSIS — Z86711 Personal history of pulmonary embolism: Secondary | ICD-10-CM | POA: Diagnosis not present

## 2014-07-05 DIAGNOSIS — K430 Incisional hernia with obstruction, without gangrene: Secondary | ICD-10-CM | POA: Diagnosis not present

## 2014-07-05 DIAGNOSIS — R591 Generalized enlarged lymph nodes: Secondary | ICD-10-CM | POA: Diagnosis not present

## 2014-07-05 DIAGNOSIS — G8929 Other chronic pain: Secondary | ICD-10-CM | POA: Diagnosis present

## 2014-07-05 DIAGNOSIS — F411 Generalized anxiety disorder: Secondary | ICD-10-CM | POA: Diagnosis not present

## 2014-07-05 DIAGNOSIS — R45851 Suicidal ideations: Secondary | ICD-10-CM | POA: Diagnosis not present

## 2014-07-05 DIAGNOSIS — F332 Major depressive disorder, recurrent severe without psychotic features: Secondary | ICD-10-CM | POA: Diagnosis not present

## 2014-07-05 DIAGNOSIS — Z9049 Acquired absence of other specified parts of digestive tract: Secondary | ICD-10-CM | POA: Diagnosis present

## 2014-07-05 DIAGNOSIS — Z9221 Personal history of antineoplastic chemotherapy: Secondary | ICD-10-CM | POA: Diagnosis not present

## 2014-07-05 DIAGNOSIS — R1084 Generalized abdominal pain: Secondary | ICD-10-CM | POA: Diagnosis not present

## 2014-07-05 DIAGNOSIS — K089 Disorder of teeth and supporting structures, unspecified: Secondary | ICD-10-CM | POA: Diagnosis present

## 2014-07-05 DIAGNOSIS — I890 Lymphedema, not elsewhere classified: Secondary | ICD-10-CM | POA: Diagnosis present

## 2014-07-05 DIAGNOSIS — Z6841 Body Mass Index (BMI) 40.0 and over, adult: Secondary | ICD-10-CM | POA: Diagnosis not present

## 2014-07-05 DIAGNOSIS — D509 Iron deficiency anemia, unspecified: Secondary | ICD-10-CM | POA: Diagnosis present

## 2014-07-05 DIAGNOSIS — I5032 Chronic diastolic (congestive) heart failure: Secondary | ICD-10-CM | POA: Diagnosis not present

## 2014-07-05 DIAGNOSIS — R0602 Shortness of breath: Secondary | ICD-10-CM | POA: Diagnosis not present

## 2014-07-05 DIAGNOSIS — K660 Peritoneal adhesions (postprocedural) (postinfection): Secondary | ICD-10-CM | POA: Diagnosis present

## 2014-07-05 DIAGNOSIS — Z923 Personal history of irradiation: Secondary | ICD-10-CM | POA: Diagnosis not present

## 2014-07-05 DIAGNOSIS — K436 Other and unspecified ventral hernia with obstruction, without gangrene: Secondary | ICD-10-CM | POA: Diagnosis not present

## 2014-07-05 DIAGNOSIS — K449 Diaphragmatic hernia without obstruction or gangrene: Secondary | ICD-10-CM | POA: Diagnosis present

## 2014-07-05 DIAGNOSIS — J9811 Atelectasis: Secondary | ICD-10-CM | POA: Diagnosis not present

## 2014-07-05 DIAGNOSIS — J984 Other disorders of lung: Secondary | ICD-10-CM | POA: Diagnosis not present

## 2014-07-05 DIAGNOSIS — M549 Dorsalgia, unspecified: Secondary | ICD-10-CM | POA: Diagnosis present

## 2014-07-05 DIAGNOSIS — Z853 Personal history of malignant neoplasm of breast: Secondary | ICD-10-CM | POA: Diagnosis not present

## 2014-07-05 LAB — MRSA PCR SCREENING: MRSA by PCR: NEGATIVE

## 2014-07-05 MED ORDER — NALOXONE HCL 0.4 MG/ML IJ SOLN: 0.4000 mg | INTRAMUSCULAR | Status: DC | PRN

## 2014-07-05 MED ORDER — ALBUTEROL SULFATE (2.5 MG/3ML) 0.083% IN NEBU: 3.0000 mL | INHALATION_SOLUTION | Freq: Four times a day (QID) | RESPIRATORY_TRACT | Status: DC | PRN

## 2014-07-05 MED ORDER — LIDOCAINE-PRILOCAINE 2.5-2.5 % EX CREA
TOPICAL_CREAM | CUTANEOUS | Status: DC | PRN
  Administered 2014-07-05: 21:00:00 via TOPICAL
  Filled 2014-07-05: qty 5

## 2014-07-05 MED ORDER — PROPOFOL 10 MG/ML IV BOLUS
INTRAVENOUS | Status: DC | PRN
  Administered 2014-07-05: 150 mg via INTRAVENOUS

## 2014-07-05 MED ORDER — NEOSTIGMINE METHYLSULFATE 10 MG/10ML IV SOLN
INTRAVENOUS | Status: DC | PRN
  Administered 2014-07-05: 3.5 mg via INTRAVENOUS

## 2014-07-05 MED ORDER — HYDROMORPHONE HCL 1 MG/ML IJ SOLN
INTRAMUSCULAR | Status: DC | PRN
  Administered 2014-07-05 (×2): 1 mg via INTRAVENOUS

## 2014-07-05 MED ORDER — NEOSTIGMINE METHYLSULFATE 10 MG/10ML IV SOLN
INTRAVENOUS | Status: AC
  Filled 2014-07-05: qty 1

## 2014-07-05 MED ORDER — DEXTROSE 5 % IV SOLN
1.0000 g | Freq: Four times a day (QID) | INTRAVENOUS | Status: AC
  Administered 2014-07-05: 1 g via INTRAVENOUS
  Filled 2014-07-05: qty 1

## 2014-07-05 MED ORDER — SUCCINYLCHOLINE CHLORIDE 20 MG/ML IJ SOLN
INTRAMUSCULAR | Status: DC | PRN
  Administered 2014-07-05: 100 mg via INTRAVENOUS

## 2014-07-05 MED ORDER — LORAZEPAM 2 MG/ML IJ SOLN
INTRAMUSCULAR | Status: AC
  Administered 2014-07-05: 0.5 mg via INTRAVENOUS
  Filled 2014-07-05: qty 1

## 2014-07-05 MED ORDER — HYDROMORPHONE HCL 2 MG/ML IJ SOLN
INTRAMUSCULAR | Status: AC
  Filled 2014-07-05: qty 1

## 2014-07-05 MED ORDER — KETOROLAC TROMETHAMINE 30 MG/ML IJ SOLN
30.0000 mg | Freq: Four times a day (QID) | INTRAMUSCULAR | Status: AC
  Administered 2014-07-05 – 2014-07-06 (×6): 30 mg via INTRAVENOUS
  Filled 2014-07-05 (×6): qty 1

## 2014-07-05 MED ORDER — FLUTICASONE PROPIONATE HFA 44 MCG/ACT IN AERO
2.0000 | INHALATION_SPRAY | Freq: Two times a day (BID) | RESPIRATORY_TRACT | Status: DC
  Administered 2014-07-05 – 2014-07-13 (×11): 2 via RESPIRATORY_TRACT
  Filled 2014-07-05: qty 10.6

## 2014-07-05 MED ORDER — ONDANSETRON HCL 4 MG/2ML IJ SOLN: 4.0000 mg | Freq: Four times a day (QID) | INTRAMUSCULAR | Status: DC | PRN

## 2014-07-05 MED ORDER — LABETALOL HCL 5 MG/ML IV SOLN
INTRAVENOUS | Status: AC
  Filled 2014-07-05: qty 4

## 2014-07-05 MED ORDER — ROCURONIUM BROMIDE 100 MG/10ML IV SOLN
INTRAVENOUS | Status: DC | PRN
  Administered 2014-07-05: 30 mg via INTRAVENOUS
  Administered 2014-07-05: 10 mg via INTRAVENOUS

## 2014-07-05 MED ORDER — PROMETHAZINE HCL 25 MG/ML IJ SOLN: 6.2500 mg | INTRAMUSCULAR | Status: DC | PRN

## 2014-07-05 MED ORDER — FENTANYL CITRATE 0.05 MG/ML IJ SOLN
INTRAMUSCULAR | Status: DC | PRN
  Administered 2014-07-05 (×2): 50 ug via INTRAVENOUS
  Administered 2014-07-05: 100 ug via INTRAVENOUS

## 2014-07-05 MED ORDER — SODIUM CHLORIDE 0.9 % IJ SOLN
INTRAMUSCULAR | Status: AC
  Filled 2014-07-05: qty 3

## 2014-07-05 MED ORDER — LEVALBUTEROL HCL 1.25 MG/0.5ML IN NEBU
INHALATION_SOLUTION | RESPIRATORY_TRACT | Status: AC
  Administered 2014-07-05: 1.25 mg via RESPIRATORY_TRACT
  Filled 2014-07-05: qty 0.5

## 2014-07-05 MED ORDER — LORAZEPAM 2 MG/ML IJ SOLN
0.5000 mg | INTRAMUSCULAR | Status: DC
  Administered 2014-07-05 – 2014-07-06 (×9): 0.5 mg via INTRAVENOUS
  Filled 2014-07-05 (×8): qty 1

## 2014-07-05 MED ORDER — DIPHENHYDRAMINE HCL 50 MG/ML IJ SOLN
12.5000 mg | Freq: Four times a day (QID) | INTRAMUSCULAR | Status: DC | PRN
  Administered 2014-07-07 – 2014-07-08 (×4): 12.5 mg via INTRAVENOUS
  Filled 2014-07-05 (×5): qty 1

## 2014-07-05 MED ORDER — SODIUM CHLORIDE 0.9 % IJ SOLN: 9.0000 mL | INTRAMUSCULAR | Status: DC | PRN

## 2014-07-05 MED ORDER — ONDANSETRON HCL 4 MG/2ML IJ SOLN
INTRAMUSCULAR | Status: DC | PRN
  Administered 2014-07-05: 4 mg via INTRAVENOUS

## 2014-07-05 MED ORDER — HYDROMORPHONE 0.3 MG/ML IV SOLN
INTRAVENOUS | Status: AC
  Filled 2014-07-05: qty 25

## 2014-07-05 MED ORDER — HYDROMORPHONE HCL 1 MG/ML IJ SOLN
INTRAMUSCULAR | Status: AC
  Filled 2014-07-05: qty 1

## 2014-07-05 MED ORDER — GLYCOPYRROLATE 0.2 MG/ML IJ SOLN
INTRAMUSCULAR | Status: AC
  Filled 2014-07-05: qty 2

## 2014-07-05 MED ORDER — LIDOCAINE HCL (PF) 2 % IJ SOLN
INTRAMUSCULAR | Status: DC | PRN
  Administered 2014-07-05: 30 mg via INTRADERMAL

## 2014-07-05 MED ORDER — ENOXAPARIN SODIUM 40 MG/0.4ML ~~LOC~~ SOLN
40.0000 mg | Freq: Every day | SUBCUTANEOUS | Status: DC
  Administered 2014-07-05 – 2014-07-13 (×9): 40 mg via SUBCUTANEOUS
  Filled 2014-07-05 (×9): qty 0.4

## 2014-07-05 MED ORDER — DIPHENHYDRAMINE HCL 12.5 MG/5ML PO ELIX
12.5000 mg | ORAL_SOLUTION | Freq: Four times a day (QID) | ORAL | Status: DC | PRN
Start: 2014-07-05 — End: 2014-07-09

## 2014-07-05 MED ORDER — LEVALBUTEROL HCL 1.25 MG/0.5ML IN NEBU
1.2500 mg | INHALATION_SOLUTION | Freq: Once | RESPIRATORY_TRACT | Status: DC
  Administered 2014-07-05: 1.25 mg via RESPIRATORY_TRACT

## 2014-07-05 MED ORDER — LABETALOL HCL 5 MG/ML IV SOLN
INTRAVENOUS | Status: DC | PRN
  Administered 2014-07-05 (×2): 5 mg via INTRAVENOUS

## 2014-07-05 MED ORDER — CETYLPYRIDINIUM CHLORIDE 0.05 % MT LIQD
7.0000 mL | Freq: Two times a day (BID) | OROMUCOSAL | Status: DC
  Administered 2014-07-05 – 2014-07-13 (×16): 7 mL via OROMUCOSAL

## 2014-07-05 MED ORDER — ONDANSETRON HCL 4 MG PO TABS: 4.0000 mg | ORAL_TABLET | Freq: Four times a day (QID) | ORAL | Status: DC | PRN

## 2014-07-05 MED ORDER — LEVALBUTEROL HCL 1.25 MG/0.5ML IN NEBU
INHALATION_SOLUTION | RESPIRATORY_TRACT | Status: AC
  Filled 2014-07-05: qty 0.5

## 2014-07-05 MED ORDER — LACTATED RINGERS IV SOLN: INTRAVENOUS | Status: DC

## 2014-07-05 MED ORDER — HYDROMORPHONE 0.3 MG/ML IV SOLN
INTRAVENOUS | Status: DC
  Administered 2014-07-05: 4.11 mg via INTRAVENOUS
  Administered 2014-07-05: 3 mg via INTRAVENOUS
  Administered 2014-07-05: 15:00:00 via INTRAVENOUS
  Administered 2014-07-05 (×2): 1.8 mg via INTRAVENOUS
  Administered 2014-07-05: 25 mg via INTRAVENOUS
  Administered 2014-07-06: 2.4 mg via INTRAVENOUS
  Administered 2014-07-06: 2.1 mg via INTRAVENOUS
  Administered 2014-07-06: 4.82 mg via INTRAVENOUS
  Administered 2014-07-06 (×2): via INTRAVENOUS
  Administered 2014-07-06 (×2): 1.2 mg via INTRAVENOUS
  Administered 2014-07-06: 1 mg via INTRAVENOUS
  Administered 2014-07-07: 1.2 mg via INTRAVENOUS
  Administered 2014-07-07: 0.9 mg via INTRAVENOUS
  Administered 2014-07-07 (×2): 0.6 mg via INTRAVENOUS
  Administered 2014-07-07: 3.2 mg via INTRAVENOUS
  Administered 2014-07-07 – 2014-07-08 (×2): 1.2 mg via INTRAVENOUS
  Administered 2014-07-08: 0.77 mg via INTRAVENOUS
  Administered 2014-07-08: 19:00:00 via INTRAVENOUS
  Administered 2014-07-08: 1.2 mg via INTRAVENOUS
  Administered 2014-07-08: 2.7 mg via INTRAVENOUS
  Administered 2014-07-09: 3.1 mg via INTRAVENOUS
  Administered 2014-07-09: 0.9 mg via INTRAVENOUS
  Administered 2014-07-09: 2.1 mg via INTRAVENOUS
  Filled 2014-07-05 (×6): qty 25

## 2014-07-05 MED ORDER — HYDROMORPHONE HCL 1 MG/ML IJ SOLN
0.2500 mg | INTRAMUSCULAR | Status: DC | PRN
  Administered 2014-07-05 (×4): 0.5 mg via INTRAVENOUS

## 2014-07-05 MED ORDER — GLYCOPYRROLATE 0.2 MG/ML IJ SOLN
INTRAMUSCULAR | Status: DC | PRN
  Administered 2014-07-05: 0.4 mg via INTRAVENOUS

## 2014-07-05 MED ORDER — LEVALBUTEROL HCL 1.25 MG/0.5ML IN NEBU: 1.2500 mg | INHALATION_SOLUTION | Freq: Three times a day (TID) | RESPIRATORY_TRACT | Status: DC

## 2014-07-05 MED ORDER — POTASSIUM CHLORIDE IN NACL 20-0.45 MEQ/L-% IV SOLN
INTRAVENOUS | Status: DC
  Administered 2014-07-05 – 2014-07-07 (×5): via INTRAVENOUS
  Filled 2014-07-05 (×9): qty 1000

## 2014-07-05 NOTE — Transfer of Care (Signed)
Immediate Anesthesia Transfer of Care Note  Patient: Alexandria [name]  Procedure(s) Performed: Procedure(s) (LRB): REPAIR OF INCARCERATED INCISIONAL HERNIA WITH MESH (N/A)  Patient Location: PACU  Anesthesia Type: General  Level of Consciousness: sedated, patient cooperative and responds to stimulation  Airway & Oxygen Therapy: Patient Spontanous Breathing and Patient connected to face mask oxgen. Audible wheezes noted. Breathing treatment in PACU  Post-op Assessment: Report given to PACU RN and Post -op Vital signs reviewed and stable  Post vital signs: Reviewed and stable  Complications: No apparent anesthesia complications

## 2014-07-05 NOTE — Progress Notes (Signed)
Central telemetry called to state monitor reading ST 1.9. Patient has no complaints of discomfort outside of anxiety, no SHOB, no chest pain. Performed EKG. EKG in chart and reviewed it with another nurse. Looks similar to previous EKG. Will continue to monitor.

## 2014-07-05 NOTE — Progress Notes (Signed)
Called to access left chest port-a-cath. Unable to palpate port fully to access. Patient states Left chest tender with palpation. Recommend I.R. To access under Fleuroscopy.

## 2014-07-05 NOTE — Progress Notes (Signed)
1 Day Post-Op  Subjective: Sore. Anxious  Objective: Vital signs in last 24 hours: Temp:  [97.6 F (36.4 C)-98.8 F (37.1 C)] 98.4 F (36.9 C) (10/18 0823) Pulse Rate:  [86-106] 95 (10/18 0900) Resp:  [16-28] 20 (10/18 0900) BP: (111-179)/(71-99) 111/78 mmHg (10/18 0600) SpO2:  [85 %-95 %] 92 % (10/18 0900) Weight:  [229 lb (103.874 kg)] 229 lb (103.874 kg) (10/17 1927) Last BM Date: 07/04/14  Intake/Output from previous day: 10/17 0701 - 10/18 0700 In: 2020 [I.V.:1520; IV Piggyback:500] Out: 420 [Urine:215; Emesis/NG output:150; Drains:55] Intake/Output this shift:    PE: General- In NAD Abdomen-Slightly firm, binder on, thin serosanguinous drain output  Lab Results:   Recent Labs  07/04/14 1928 07/04/14 1945  WBC 10.4  --   HGB 12.1 13.6  HCT 38.1 40.0  PLT 277  --    BMET  Recent Labs  07/04/14 1928 07/04/14 1945  NA 140 139  K 4.1 3.6*  CL 97 98  CO2 30  --   GLUCOSE 128* 130*  BUN 14 14  CREATININE 0.84 0.90  CALCIUM 9.4  --    PT/INR No results found for this basename: LABPROT, INR,  in the last 72 hours Comprehensive Metabolic Panel:    Component Value Date/Time   NA 139 07/04/2014 1945   NA 140 07/04/2014 1928   NA 142 01/01/2014 1400   NA 140 12/01/2013 1416   K 3.6* 07/04/2014 1945   K 4.1 07/04/2014 1928   K 3.2* 01/01/2014 1400   K 3.4* 12/01/2013 1416   CL 98 07/04/2014 1945   CL 97 07/04/2014 1928   CL 99 03/04/2013 1239   CO2 30 07/04/2014 1928   CO2 29 04/29/2014 1034   CO2 30* 01/01/2014 1400   CO2 29 12/01/2013 1416   BUN 14 07/04/2014 1945   BUN 14 07/04/2014 1928   BUN 17.3 01/01/2014 1400   BUN 17.5 12/01/2013 1416   CREATININE 0.90 07/04/2014 1945   CREATININE 0.84 07/04/2014 1928   CREATININE 1.0 01/01/2014 1400   CREATININE 1.5* 12/01/2013 1416   GLUCOSE 130* 07/04/2014 1945   GLUCOSE 128* 07/04/2014 1928   GLUCOSE 132 01/01/2014 1400   GLUCOSE 111 12/01/2013 1416   GLUCOSE 125* 03/04/2013 1239   CALCIUM 9.4 07/04/2014  1928   CALCIUM 9.1 04/29/2014 1034   CALCIUM 9.8 01/01/2014 1400   CALCIUM 9.5 12/01/2013 1416   AST 39* 07/04/2014 1928   AST 27 04/29/2014 1034   AST 32 01/01/2014 1400   AST 22 12/01/2013 1416   ALT 31 07/04/2014 1928   ALT 28 04/29/2014 1034   ALT 29 01/01/2014 1400   ALT 18 12/01/2013 1416   ALKPHOS 190* 07/04/2014 1928   ALKPHOS 152* 04/29/2014 1034   ALKPHOS 155* 01/01/2014 1400   ALKPHOS 187* 12/01/2013 1416   BILITOT 0.3 07/04/2014 1928   BILITOT 0.4 04/29/2014 1034   BILITOT 0.25 01/01/2014 1400   BILITOT <0.20 12/01/2013 1416   PROT 7.7 07/04/2014 1928   PROT 7.8 04/29/2014 1034   PROT 7.7 01/01/2014 1400   PROT 7.6 12/01/2013 1416   ALBUMIN 3.5 07/04/2014 1928   ALBUMIN 3.8 04/29/2014 1034   ALBUMIN 3.7 01/01/2014 1400   ALBUMIN 3.5 12/01/2013 1416     Studies/Results: Ct Abdomen Pelvis Wo Contrast  07/04/2014   CLINICAL DATA:  Vomiting and abdominal pain for 2 days, initial evaluation, concern for obstruction  EXAM: CT ABDOMEN AND PELVIS WITHOUT CONTRAST  TECHNIQUE: Multidetector CT imaging of the abdomen  and pelvis was performed following the standard protocol without IV contrast.  COMPARISON:  04/10/2014, 01/21/2014  FINDINGS: Rounded atelectasis posterior right lung base stable from 01/21/2014. Known large hiatal hernia.  Fatty infiltration of the liver. Gallbladder normal. Spleen normal. Pancreas normal.  Adrenal glands normal.  Left kidney normal.  Right kidney normal.  Mild calcification of the abdominal aorta. Bladder normal. Reproductive organs not identified.  The stomach is relatively decompressed. There are numerous loops of dilated small bowel demonstrating air-fluid levels. These are dilated up to about 4 cm. Caliber transition appears to occur near the junction of the jejunum and ileum, and appears to be related to a small periumbilical hernia.  No acute musculoskeletal findings.  IMPRESSION: Distal small bowel obstruction likely related to periumbilical hernia.  Stable  rounded atelectasis right lung base. Known large hiatal hernia.   Electronically Signed   By: Skipper Cliche M.D.   On: 07/04/2014 21:15    Anti-infectives: Anti-infectives   Start     Dose/Rate Route Frequency Ordered Stop   07/05/14 0600  cefOXitin (MEFOXIN) 1 g in dextrose 5 % 50 mL IVPB     1 g 100 mL/hr over 30 Minutes Intravenous 4 times per day 07/05/14 0344 07/05/14 0628   07/04/14 2300  cefOXitin (MEFOXIN) 2 g in dextrose 5 % 50 mL IVPB     2 g 100 mL/hr over 30 Minutes Intravenous Once 07/04/14 2257 07/04/14 2344      Assessment Active Problems:   Ventral incisional hernia with obstruction s/p emergency repair with mesh earlier this morning-sore; anxious    LOS: 1 day   Plan: Access PAC for IV access.  Check lab tomorrow.  IV Ativan written.   Tariq Pernell J 07/05/2014

## 2014-07-05 NOTE — Op Note (Signed)
Preoperative Diagnosis: Incarcerated ventral hernia with Small bowel obstruction  Postoprative Diagnosis: Same  Procedure:  Repair of incarcerated ventral incisional hernia with mesh   Surgeon: Excell Seltzer T   Assistants: None  Anesthesia:  General endotracheal anesthesia  Indications: Patient is a 63 year old female with morbid obesity and previous surgical history a C-section, partial colectomy per patient and laparotomy for bowel obstruction an apparent hernia repair with mesh as well. She was scheduled for a sleeve gastrectomy with repair of large hiatal hernia and repair of her ventral hernia in one week. She however presents with 2-3 days of worsening generalized abdominal pain and nausea and vomiting. CT scan has shown small bowel obstruction with an incarcerated loop of small intestine in a periumbilical incisional hernia. Exam has revealed a tender mass at the umbilicus at her incision and evidence of bowel obstruction. I recommended proceeding with emergency repair due to incarceration. Indications and risks for surgery were discussed in detail those where she is in agreement.   Procedure Detail:  Patient was brought to the operating room, placed in the supine position on the operating table, and general endotracheal anesthesia induced. She had received preoperative IV antibiotics. She received subcutaneous heparin preoperatively. Foley catheter was placed. PAS were in place. The abdomen was widely sterilely prepped and draped. Patient was performed and correct procedure verified. I made an approximately 8-10 cm incision using the previous midline incision skirting the umbilicus and dissection was carried down through the subcutaneous tissue toward the fascia. At this point I could no longer feel the mass at the umbilicus I suspect her hernia may have reduced with pressure and anesthesia. I dissected down to the midline fascia and permanent braided suture was encountered and removed.  I opened the midline fascia adjacent to the umbilicus. I encountered a approximately 2 cm hernia defect just adjacent to the umbilicus. The fascia was opened through this. There were dense bowel adhesions beneath the fascia. These were quite marked and careful sharp dissection was used to separate the bowel from the anterior abdominal wall. In the area of the hernia I encountered an edematous knuckle of small intestine with a ring of thickening around the base Which was clearly the loop of bowel that had been incarcerated in the hernia. I opened the fascia over about an 8 cm length. I took the adhesions out laterally and superiorly but there remains a dense and I did not encounter any free peritoneal cavity. At the inferior portion of the incision there was mesh and extremely dense adhesions to bowel at the inferior portion of the incision with suture material as well intra-abdominally adherent to the bowel. I dissected inferiorly about a centimeter to allow closure but did not attempt any further dissection of the bowel off the anterior abdominal wall as I felt was likely to result in bowel injury requiring a much more extensive laparotomy which I wanted to avoid. Examination of the small bowel this point showed no evidence of injury. As above I felt I could clearly see the area that had been incarcerated in the hernia but other loops of small bowel were essentially matted together with a dense cicatrix I could really not separate any other loops of small intestine. In order to close the fascia back to the midline I raised the skin and subcutaneous flaps for about 5 cm in all directions. The fascia was then closed in the midline with Interrupted #1 Novafil sutures. I used a 6 x 3 cm piece of Prolene mesh  as an onlay over the fascial closure and this was tacked to the anterior fascia out in all directions with interrupted 0 Novafil. The wound was thoroughly irrigated. A 19 Blake closed suction drain was left on top  of the mesh. The subcutaneous tissue was closed with running 3-0 Vicryl and the skin with staples. Dressed the dressing was applied. Sponge needle and instrument counts were correct.    Findings: As above  Estimated Blood Loss:  less than 50 mL         Drains: 19 round Blake in subcutaneous space  Blood Given: none          Specimens: None        Complications:  * No complications entered in OR log *         Disposition: PACU - hemodynamically stable.         Condition: stable

## 2014-07-06 ENCOUNTER — Encounter (HOSPITAL_COMMUNITY): Payer: Self-pay | Admitting: General Surgery

## 2014-07-06 ENCOUNTER — Inpatient Hospital Stay: Admission: RE | Admit: 2014-07-06 | Payer: Self-pay | Source: Ambulatory Visit

## 2014-07-06 LAB — CBC
HEMATOCRIT: 29.6 % — AB (ref 36.0–46.0)
Hemoglobin: 9.2 g/dL — ABNORMAL LOW (ref 12.0–15.0)
MCH: 25.9 pg — AB (ref 26.0–34.0)
MCHC: 31.1 g/dL (ref 30.0–36.0)
MCV: 83.4 fL (ref 78.0–100.0)
PLATELETS: 212 10*3/uL (ref 150–400)
RBC: 3.55 MIL/uL — ABNORMAL LOW (ref 3.87–5.11)
RDW: 16.5 % — AB (ref 11.5–15.5)
WBC: 4 10*3/uL (ref 4.0–10.5)

## 2014-07-06 LAB — BASIC METABOLIC PANEL
Anion gap: 9 (ref 5–15)
BUN: 12 mg/dL (ref 6–23)
CALCIUM: 8.4 mg/dL (ref 8.4–10.5)
CHLORIDE: 100 meq/L (ref 96–112)
CO2: 28 mEq/L (ref 19–32)
CREATININE: 0.87 mg/dL (ref 0.50–1.10)
GFR calc non Af Amer: 70 mL/min — ABNORMAL LOW (ref >90)
GFR, EST AFRICAN AMERICAN: 82 mL/min — AB (ref >90)
Glucose, Bld: 91 mg/dL (ref 70–99)
Potassium: 3.7 mEq/L (ref 3.7–5.3)
Sodium: 137 mEq/L (ref 137–147)

## 2014-07-06 MED ORDER — ACETAMINOPHEN 650 MG RE SUPP
650.0000 mg | Freq: Four times a day (QID) | RECTAL | Status: DC | PRN
  Administered 2014-07-06: 650 mg via RECTAL
  Filled 2014-07-06: qty 1

## 2014-07-06 MED ORDER — LORAZEPAM 1 MG PO TABS
1.0000 mg | ORAL_TABLET | Freq: Four times a day (QID) | ORAL | Status: DC
  Administered 2014-07-06 – 2014-07-13 (×26): 1 mg via ORAL
  Filled 2014-07-06 (×26): qty 1

## 2014-07-06 MED ORDER — PHENOL 1.4 % MT LIQD
1.0000 | OROMUCOSAL | Status: DC | PRN
  Filled 2014-07-06: qty 177

## 2014-07-06 MED ORDER — ACETAMINOPHEN 325 MG PO TABS
650.0000 mg | ORAL_TABLET | Freq: Four times a day (QID) | ORAL | Status: DC | PRN
  Administered 2014-07-06 – 2014-07-08 (×4): 650 mg via ORAL
  Filled 2014-07-06 (×6): qty 2

## 2014-07-06 MED ORDER — HYDROMORPHONE HCL 1 MG/ML IJ SOLN
0.5000 mg | INTRAMUSCULAR | Status: DC | PRN
  Administered 2014-07-06 – 2014-07-07 (×2): 1 mg via INTRAVENOUS
  Filled 2014-07-06 (×4): qty 1

## 2014-07-06 NOTE — Progress Notes (Signed)
Patient has been very anxious, tearful, irritable throughout shift. Multiple times she has complained of pain in her abdomen, as well as her tooth/left ear/head. I have called the MD on call multiple times to make aware and have followed instructions/ orders for heat/ice on ear/head, chloraseptic spray for throat/tooth, repositioning, emotional support, etc. She has stated multiple times that she "couldn't do it anymore", wanted to be "transferred to a different hospital", etc. I have continued to give emotional, physical support, and have been monitoring patient. VSS.

## 2014-07-06 NOTE — Progress Notes (Addendum)
Pt requesting to speak with a Supervisor. Care Coordinator spoke with pt about concerns.  Pt c/o hurting. Stating over many times, "help me."  When asked what she needed she stated she was hurting in her jaw, ear and stomach.  When asked about pain she stated she wanted Dilaudid increased, based on Charge RN report this am it's the same conversation with RN/MD last pm. When this RN explained that she was getting Dilaudid and clearified the doseage, she then wanted Ativan increased to "calm" her down. She states she takes Ativan 1 mg every 4 hours at home and 0.5 mg is not enough for her. Coralie Keens PA paged again by this RN concerning above. She will lay eyes on her physically before increasing medication as to be sure not to over sedate pt. She is aware of situation and will round on pt within the hour. This RN followed up with pt and she if okay at this time but requesting for the PA to hurry.

## 2014-07-06 NOTE — Progress Notes (Addendum)
2 Days Post-Op  Subjective: Pt is a crying emotional mess since surgery.  She is calling out in agony complaining about her ear and tooth. She tells me she was at the dentist office waiting for her appt when her abdomen started hurting, so she didn't get seen.  She says the tooth has been hurting for 3 months and the last upper left molar.  She says its referring pain to her left ear.  Pt says the pain meds aren't working.   She's hungry/thirsty.  Objective: Vital signs in last 24 hours: Temp:  [97.7 F (36.5 C)-98.4 F (36.9 C)] 97.7 F (36.5 C) (10/19 1200) Pulse Rate:  [70-95] 84 (10/19 1239) Resp:  [11-24] 24 (10/19 1239) BP: (115-169)/(65-75) 130/75 mmHg (10/19 1239) SpO2:  [93 %-98 %] 93 % (10/19 1239) FiO2 (%):  [2 %-96 %] 2 % (10/19 1200) Weight:  [123.923 kg (273 lb 3.2 oz)] 123.923 kg (273 lb 3.2 oz) (10/19 0400) Last BM Date: 07/04/14  Drain output: 18mL/24hr   Intake/Output from previous day: 10/18 0701 - 10/19 0700 In: 2143 [I.V.:2113; NG/GT:30] Out: 670 [Urine:545; Emesis/NG output:45; Drains:80] Intake/Output this shift: Total I/O In: 630 [I.V.:600; Other:30] Out: 9 [Urine:275]  Physical Exam: General: Anxious, obese white female who is laying in bed in a significant crying emotional outbursts Heart: regular, rate, and rhythm.  Normal s1,s2. No obvious murmurs, gallops, or rubs noted.  Palpable radial and pedal pulses bilaterally Lungs: CTAB, no wheezes, rhonchi, or rales noted.  Respiratory effort nonlabored Abd: Obese, soft, distended, tender throughout, +BS, no masses, hernias, or organomegaly, midline wound C/D/I, JP drain with serosanguinous drainage Psych: A&Ox3 with extreme anxiety   Lab Results:   Recent Labs  07/04/14 1928 07/04/14 1945 07/06/14 0345  WBC 10.4  --  4.0  HGB 12.1 13.6 9.2*  HCT 38.1 40.0 29.6*  PLT 277  --  212    BMET  Recent Labs  07/04/14 1928 07/04/14 1945 07/06/14 0345  NA 140 139 137  K 4.1 3.6* 3.7  CL 97 98  100  CO2 30  --  28  GLUCOSE 128* 130* 91  BUN 14 14 12   CREATININE 0.84 0.90 0.87  CALCIUM 9.4  --  8.4   PT/INR No results found for this basename: LABPROT, INR,  in the last 72 hours   Recent Labs Lab 07/04/14 1928  AST 39*  ALT 31  ALKPHOS 190*  BILITOT 0.3  PROT 7.7  ALBUMIN 3.5     Lipase     Component Value Date/Time   LIPASE 22 12/11/2013 1444     Studies/Results: Ct Abdomen Pelvis Wo Contrast  07/04/2014   CLINICAL DATA:  Vomiting and abdominal pain for 2 days, initial evaluation, concern for obstruction  EXAM: CT ABDOMEN AND PELVIS WITHOUT CONTRAST  TECHNIQUE: Multidetector CT imaging of the abdomen and pelvis was performed following the standard protocol without IV contrast.  COMPARISON:  04/10/2014, 01/21/2014  FINDINGS: Rounded atelectasis posterior right lung base stable from 01/21/2014. Known large hiatal hernia.  Fatty infiltration of the liver. Gallbladder normal. Spleen normal. Pancreas normal.  Adrenal glands normal.  Left kidney normal.  Right kidney normal.  Mild calcification of the abdominal aorta. Bladder normal. Reproductive organs not identified.  The stomach is relatively decompressed. There are numerous loops of dilated small bowel demonstrating air-fluid levels. These are dilated up to about 4 cm. Caliber transition appears to occur near the junction of the jejunum and ileum, and appears to be related to a  small periumbilical hernia.  No acute musculoskeletal findings.  IMPRESSION: Distal small bowel obstruction likely related to periumbilical hernia.  Stable rounded atelectasis right lung base. Known large hiatal hernia.   Electronically Signed   By: Skipper Cliche M.D.   On: 07/04/2014 21:15   Dg Chest Port 1 View  07/05/2014   CLINICAL DATA:  The patient states that she has a deep, left-sided porta catheter. The nurses are unable to feel or access the porta catheter.  EXAM: PORTABLE CHEST - 1 VIEW  COMPARISON:  Chest CT dated 01/21/2014.  FINDINGS:  The previously seen left subclavian porta catheter port is no longer visualized. The catheter remains in place with its tip in the superior vena cava. A nasogastric tube extending into the previously demonstrated large hiatal hernia with its tip in the region of the stomach in the upper left abdomen. Enlarged cardiac silhouette. Linear density at the right lung base. Diffusely prominent pulmonary vasculature and interstitial markings. Unremarkable bones.  IMPRESSION: 1. The previously demonstrated left subclavian porta catheter access port is not radiographically visible today. 2. Stable left subclavian catheter. 3. Nasogastric tube extending into a large hiatal hernia. 4. Cardiomegaly, pulmonary vascular congestion, chronic interstitial lung disease and mild right basilar atelectasis.   Electronically Signed   By: Enrique Sack M.D.   On: 07/05/2014 11:46    Medications: . antiseptic oral rinse  7 mL Mouth Rinse BID  . enoxaparin (LOVENOX) injection  40 mg Subcutaneous Daily  . fluticasone  2 puff Inhalation BID  . heparin subcutaneous  5,000 Units Subcutaneous Once  . HYDROmorphone PCA 0.3 mg/mL   Intravenous 6 times per day  . ketorolac  30 mg Intravenous 4 times per day  . LORazepam  0.5 mg Intravenous Q4H   . 0.45 % NaCl with KCl 20 mEq / L 100 mL/hr at 07/06/14 1143   Prior to Admission medications   Medication Sig Start Date End Date Taking? Authorizing Provider  albuterol (PROVENTIL HFA;VENTOLIN HFA) 108 (90 BASE) MCG/ACT inhaler Inhale 2 puffs into the lungs every 6 (six) hours as needed for wheezing or shortness of breath (wheezing).   Yes Historical Provider, MD  anastrozole (ARIMIDEX) 1 MG tablet Take 1 mg by mouth daily.   Yes Historical Provider, MD  atorvastatin (LIPITOR) 20 MG tablet Take 1 tablet (20 mg total) by mouth daily. 06/18/14  Yes Marletta Lor, MD  budesonide (PULMICORT) 180 MCG/ACT inhaler Inhale 2 puffs into the lungs daily as needed (shortness of breath).   Yes  Historical Provider, MD  escitalopram (LEXAPRO) 20 MG tablet Take 1 tablet (20 mg total) by mouth daily. 01/22/14  Yes Heath Lark, MD  ferrous sulfate 325 (65 FE) MG tablet Take 325 mg by mouth daily with breakfast. 11/20/13  Yes Kinnie Feil, MD  furosemide (LASIX) 80 MG tablet Take 80 mg by mouth 3 (three) times daily.   Yes Historical Provider, MD  HYDROmorphone (DILAUDID) 4 MG tablet Take 4 mg by mouth 3 (three) times daily as needed for severe pain (pain).    Yes Historical Provider, MD  HYDROmorphone HCl (EXALGO) 12 MG T24A SR tablet Take 12 mg by mouth 2 (two) times daily.   Yes Historical Provider, MD  levalbuterol Penne Lash) 0.63 MG/3ML nebulizer solution Take 0.63 mg by nebulization every 4 (four) hours as needed for wheezing or shortness of breath (shortness of breath).   Yes Historical Provider, MD  lidocaine (LIDODERM) 5 % Place 1-2 patches onto the skin daily. Remove &  Discard patch within 12 hours or as directed by MD   Yes Historical Provider, MD  lidocaine-prilocaine (EMLA) cream Apply 1 application topically as needed (port access).    Yes Historical Provider, MD  LORazepam (ATIVAN) 0.5 MG tablet Take 0.5 mg by mouth 2 (two) times daily as needed for anxiety (anxiety).   Yes Historical Provider, MD  lubiprostone (AMITIZA) 24 MCG capsule Take 1 capsule (24 mcg total) by mouth 2 (two) times daily with a meal. 04/03/14  Yes Heath Lark, MD  Multiple Vitamins-Minerals (HAIR/SKIN/NAILS PO) Take 1 tablet by mouth every morning.   Yes Historical Provider, MD  potassium chloride (KLOR-CON) 20 MEQ packet Take 40 mEq by mouth 3 (three) times daily.   Yes Historical Provider, MD     Assessment/Plan Incarcerated ventral hernia with Small bowel obstruction  POD #2 S/p Repair of incarcerated ventral incisional hernia with mesh, 07/05/2014, Excell Seltzer, MD Chronic constipation Hx of partial colectomy/laparotomy for SBO Right breast cancer with radiation and chemotherapy/lumpectomy 2014 Hx  of PE Chronic back pain on dilaudid 12 mg daily PRN and dilaudid XL 24 mg daily Hx of anemia Body mass index is 41.55 kg/(m^2). Severe anxiety/anxiety attacks   Plan: 1.  Call psych for help managing her severe anxiety, anxiety attacks, the medications we have her on are not helping.  Her mental health is dramatically impacting her recovery at this point and her ability to progress towards home. 2.  Need her to mobilize OOB 3.  She's having ear and tooth pain.  She says she had an appt with a Dentist the day she was brought to the hospital thus did not get checked for her tooth.  No dentist is on call today.  She will have to follow up as an OP.  I will order her Orajel and magic mouthwash to help with this pain.  Her ear pain is likely a combo of referred pain from the NG which is now out and her tooth pain 4.  Add robaxin, continue ice/heat, pain meds at current dosages 5.  Pt is not cooperative with staff and is requiring an extreme amount of emotional support. 6.  Start clears 7.  Change dressing daily 8.  Resume home meds, start dilaudid 12mg  BID and stop IV push dilaudid, continue dilaudid PCA for now    LOS: 2 days    JENNINGS,WILLARD 07/06/2014

## 2014-07-06 NOTE — Progress Notes (Signed)
Patient rating abdominal pain 10/10 and feels that she would get better relief from a Morphine PCA. Dr. Zella Richer called and informed of patient ongoing abdominal pain. Order given to change PCA from Dilaudid to high dose Morphine. After informing patient of physician order, patient refused morphine pca after remembering a past experience of hallucinations with medication. Patient states she would like to continue with dilaudid PCA.

## 2014-07-06 NOTE — Progress Notes (Signed)
Patient ID: Alexandria [name], female   DOB: 02-Feb-1951, 63 y.o.   MRN: [id number] 2 Days Post-Op  Subjective: Complaining bitterly of NG tube. Somewhat anxious. Would like her regular dose of Ativan (1 mg ).  Has had flatus. Expected incisional pain.  Objective: Vital signs in last 24 hours: Temp:  [97.7 F (36.5 C)-98.4 F (36.9 C)] 97.7 F (36.5 C) (10/19 1200) Pulse Rate:  [70-95] 84 (10/19 1239) Resp:  [11-24] 24 (10/19 1239) BP: (115-169)/(65-75) 130/75 mmHg (10/19 1239) SpO2:  [93 %-98 %] 93 % (10/19 1239) FiO2 (%):  [2 %-96 %] 2 % (10/19 1200) Weight:  [273 lb 3.2 oz (123.923 kg)] 273 lb 3.2 oz (123.923 kg) (10/19 0400) Last BM Date: 07/04/14  Intake/Output from previous day: 10/18 0701 - 10/19 0700 In: 2143 [I.V.:2113; NG/GT:30] Out: 670 [Urine:545; Emesis/NG output:45; Drains:80] Intake/Output this shift: Total I/O In: 630 [I.V.:600; Other:30] Out: 275 [Urine:275]  General appearance: alert, cooperative and mild distress Resp: clear to auscultation bilaterally GI: appropriate incisional tenderness. Less distended. JP drainage serosanguineous.  Lab Results:   Recent Labs  07/04/14 1928 07/04/14 1945 07/06/14 0345  WBC 10.4  --  4.0  HGB 12.1 13.6 9.2*  HCT 38.1 40.0 29.6*  PLT 277  --  212   BMET  Recent Labs  07/04/14 1928 07/04/14 1945 07/06/14 0345  NA 140 139 137  K 4.1 3.6* 3.7  CL 97 98 100  CO2 30  --  28  GLUCOSE 128* 130* 91  BUN 14 14 12   CREATININE 0.84 0.90 0.87  CALCIUM 9.4  --  8.4     Studies/Results: Ct Abdomen Pelvis Wo Contrast  07/04/2014   CLINICAL DATA:  Vomiting and abdominal pain for 2 days, initial evaluation, concern for obstruction  EXAM: CT ABDOMEN AND PELVIS WITHOUT CONTRAST  TECHNIQUE: Multidetector CT imaging of the abdomen and pelvis was performed following the standard protocol without IV contrast.  COMPARISON:  04/10/2014, 01/21/2014  FINDINGS: Rounded atelectasis posterior right lung base stable from 01/21/2014.  Known large hiatal hernia.  Fatty infiltration of the liver. Gallbladder normal. Spleen normal. Pancreas normal.  Adrenal glands normal.  Left kidney normal.  Right kidney normal.  Mild calcification of the abdominal aorta. Bladder normal. Reproductive organs not identified.  The stomach is relatively decompressed. There are numerous loops of dilated small bowel demonstrating air-fluid levels. These are dilated up to about 4 cm. Caliber transition appears to occur near the junction of the jejunum and ileum, and appears to be related to a small periumbilical hernia.  No acute musculoskeletal findings.  IMPRESSION: Distal small bowel obstruction likely related to periumbilical hernia.  Stable rounded atelectasis right lung base. Known large hiatal hernia.   Electronically Signed   By: Skipper Cliche M.D.   On: 07/04/2014 21:15   Dg Chest Port 1 View  07/05/2014   CLINICAL DATA:  The patient states that she has a deep, left-sided porta catheter. The nurses are unable to feel or access the porta catheter.  EXAM: PORTABLE CHEST - 1 VIEW  COMPARISON:  Chest CT dated 01/21/2014.  FINDINGS: The previously seen left subclavian porta catheter port is no longer visualized. The catheter remains in place with its tip in the superior vena cava. A nasogastric tube extending into the previously demonstrated large hiatal hernia with its tip in the region of the stomach in the upper left abdomen. Enlarged cardiac silhouette. Linear density at the right lung base. Diffusely prominent pulmonary vasculature and interstitial markings. Unremarkable  bones.  IMPRESSION: 1. The previously demonstrated left subclavian porta catheter access port is not radiographically visible today. 2. Stable left subclavian catheter. 3. Nasogastric tube extending into a large hiatal hernia. 4. Cardiomegaly, pulmonary vascular congestion, chronic interstitial lung disease and mild right basilar atelectasis.   Electronically Signed   By: Enrique Sack M.D.    On: 07/05/2014 11:46    Anti-infectives: Anti-infectives   Start     Dose/Rate Route Frequency Ordered Stop   07/05/14 0600  cefOXitin (MEFOXIN) 1 g in dextrose 5 % 50 mL IVPB     1 g 100 mL/hr over 30 Minutes Intravenous 4 times per day 07/05/14 0344 07/05/14 0628   07/04/14 2300  cefOXitin (MEFOXIN) 2 g in dextrose 5 % 50 mL IVPB     2 g 100 mL/hr over 30 Minutes Intravenous Once 07/04/14 2257 07/04/14 2344      Assessment/Plan: s/p Procedure(s): REPAIR OF INCARCERATED INCISIONAL HERNIA WITH MESH No apparent complications. Pain control difficult with history of chronic narcotics and sedatives. DC NG tube. Transfer to floor. Activity encouraged.    LOS: 2 days    [name] T 07/06/2014

## 2014-07-06 NOTE — Progress Notes (Signed)
Dr Excell Seltzer PA notifed of pts c/o headache,left ear pain and request for more pain med. Tylenol supp. Ordered. PA states pt will be seen by her today. She does not want to order or change pain med.

## 2014-07-06 NOTE — Progress Notes (Signed)
No orders in EPIC .  Preop on 07/07/14 at 200pm.  Surgery on 07/13/2014.  Need orders in EPIC.  Thank You.

## 2014-07-06 NOTE — Plan of Care (Signed)
Problem: Phase I Progression Outcomes Goal: Vital signs/hemodynamically stable Foley d/c at 1500. Pt transferred to 1523 via bed.

## 2014-07-06 NOTE — Progress Notes (Signed)
Please disregard the previous note .  I see this patient has been admitted to hospital.

## 2014-07-07 ENCOUNTER — Inpatient Hospital Stay (HOSPITAL_COMMUNITY)
Admission: RE | Admit: 2014-07-07 | Discharge: 2014-07-07 | Disposition: A | Discharge status: Home / Self Care | Payer: Self-pay | Source: Ambulatory Visit

## 2014-07-07 ENCOUNTER — Telehealth: Payer: Self-pay | Admitting: Internal Medicine

## 2014-07-07 LAB — CBC
HCT: 30.8 % — ABNORMAL LOW (ref 36.0–46.0)
Hemoglobin: 9.7 g/dL — ABNORMAL LOW (ref 12.0–15.0)
MCH: 25.8 pg — ABNORMAL LOW (ref 26.0–34.0)
MCHC: 31.5 g/dL (ref 30.0–36.0)
MCV: 81.9 fL (ref 78.0–100.0)
Platelets: 251 10*3/uL (ref 150–400)
RBC: 3.76 MIL/uL — ABNORMAL LOW (ref 3.87–5.11)
RDW: 16.6 % — AB (ref 11.5–15.5)
WBC: 4.7 10*3/uL (ref 4.0–10.5)

## 2014-07-07 LAB — BASIC METABOLIC PANEL
Anion gap: 14 (ref 5–15)
BUN: 12 mg/dL (ref 6–23)
CO2: 25 mEq/L (ref 19–32)
Calcium: 8.7 mg/dL (ref 8.4–10.5)
Chloride: 103 mEq/L (ref 96–112)
Creatinine, Ser: 0.84 mg/dL (ref 0.50–1.10)
GFR, EST AFRICAN AMERICAN: 85 mL/min — AB (ref >90)
GFR, EST NON AFRICAN AMERICAN: 74 mL/min — AB (ref >90)
GLUCOSE: 70 mg/dL (ref 70–99)
Potassium: 4.1 mEq/L (ref 3.7–5.3)
SODIUM: 142 meq/L (ref 137–147)

## 2014-07-07 MED ORDER — POTASSIUM CHLORIDE 20 MEQ/15ML (10%) PO LIQD
40.0000 meq | Freq: Three times a day (TID) | ORAL | Status: DC
  Filled 2014-07-07 (×3): qty 30

## 2014-07-07 MED ORDER — FERROUS SULFATE 325 (65 FE) MG PO TABS
325.0000 mg | ORAL_TABLET | Freq: Every day | ORAL | Status: DC
  Administered 2014-07-08 – 2014-07-11 (×4): 325 mg via ORAL
  Filled 2014-07-07 (×8): qty 1

## 2014-07-07 MED ORDER — POTASSIUM CHLORIDE CRYS ER 20 MEQ PO TBCR
40.0000 meq | EXTENDED_RELEASE_TABLET | Freq: Three times a day (TID) | ORAL | Status: DC
  Administered 2014-07-07 – 2014-07-13 (×15): 40 meq via ORAL
  Filled 2014-07-07 (×21): qty 2

## 2014-07-07 MED ORDER — ATORVASTATIN CALCIUM 20 MG PO TABS
20.0000 mg | ORAL_TABLET | Freq: Every day | ORAL | Status: DC
  Administered 2014-07-07 – 2014-07-13 (×7): 20 mg via ORAL
  Filled 2014-07-07 (×7): qty 1

## 2014-07-07 MED ORDER — QUETIAPINE FUMARATE 50 MG PO TABS
50.0000 mg | ORAL_TABLET | Freq: Once | ORAL | Status: AC
  Administered 2014-07-07: 50 mg via ORAL
  Filled 2014-07-07: qty 1

## 2014-07-07 MED ORDER — ESCITALOPRAM OXALATE 20 MG PO TABS
20.0000 mg | ORAL_TABLET | Freq: Every day | ORAL | Status: DC
  Administered 2014-07-07 – 2014-07-13 (×7): 20 mg via ORAL
  Filled 2014-07-07 (×7): qty 1

## 2014-07-07 MED ORDER — FUROSEMIDE 80 MG PO TABS
80.0000 mg | ORAL_TABLET | Freq: Three times a day (TID) | ORAL | Status: DC
  Administered 2014-07-07 – 2014-07-13 (×15): 80 mg via ORAL
  Filled 2014-07-07 (×22): qty 1

## 2014-07-07 MED ORDER — POLYETHYLENE GLYCOL 3350 17 G PO PACK
17.0000 g | PACK | Freq: Two times a day (BID) | ORAL | Status: DC
  Administered 2014-07-07: 17 g via ORAL
  Filled 2014-07-07 (×14): qty 1

## 2014-07-07 MED ORDER — METHOCARBAMOL 1000 MG/10ML IJ SOLN
1000.0000 mg | Freq: Three times a day (TID) | INTRAVENOUS | Status: DC
  Administered 2014-07-07 – 2014-07-09 (×6): 1000 mg via INTRAVENOUS
  Filled 2014-07-07 (×7): qty 10

## 2014-07-07 MED ORDER — LUBIPROSTONE 24 MCG PO CAPS
24.0000 ug | ORAL_CAPSULE | Freq: Two times a day (BID) | ORAL | Status: DC
  Administered 2014-07-07 – 2014-07-13 (×12): 24 ug via ORAL
  Filled 2014-07-07 (×15): qty 1

## 2014-07-07 MED ORDER — BISACODYL 10 MG RE SUPP
10.0000 mg | Freq: Every day | RECTAL | Status: DC
  Filled 2014-07-07 (×4): qty 1

## 2014-07-07 MED ORDER — MAGIC MOUTHWASH W/LIDOCAINE
5.0000 mL | Freq: Four times a day (QID) | ORAL | Status: DC | PRN
  Administered 2014-07-07: 5 mL via ORAL
  Filled 2014-07-07: qty 5

## 2014-07-07 MED ORDER — ANASTROZOLE 1 MG PO TABS
1.0000 mg | ORAL_TABLET | Freq: Every day | ORAL | Status: DC
  Administered 2014-07-07 – 2014-07-13 (×7): 1 mg via ORAL
  Filled 2014-07-07 (×7): qty 1

## 2014-07-07 MED ORDER — KETOROLAC TROMETHAMINE 30 MG/ML IJ SOLN
30.0000 mg | Freq: Four times a day (QID) | INTRAMUSCULAR | Status: DC | PRN
  Administered 2014-07-07 (×2): 30 mg via INTRAVENOUS
  Filled 2014-07-07 (×2): qty 1

## 2014-07-07 MED ORDER — DOCUSATE SODIUM 100 MG PO CAPS
100.0000 mg | ORAL_CAPSULE | Freq: Two times a day (BID) | ORAL | Status: DC
  Administered 2014-07-07 – 2014-07-13 (×13): 100 mg via ORAL
  Filled 2014-07-07 (×14): qty 1

## 2014-07-07 MED ORDER — SODIUM CHLORIDE 0.9 % IV SOLN
INTRAVENOUS | Status: DC
  Administered 2014-07-07 – 2014-07-11 (×4): via INTRAVENOUS
  Administered 2014-07-12: 10 mL/h via INTRAVENOUS

## 2014-07-07 MED ORDER — BENZOCAINE 10 % MT GEL
Freq: Four times a day (QID) | OROMUCOSAL | Status: DC | PRN
  Administered 2014-07-07: 12:00:00 via OROMUCOSAL
  Filled 2014-07-07: qty 9.4

## 2014-07-07 MED ORDER — HYDROMORPHONE HCL ER 12 MG PO T24A
12.0000 mg | EXTENDED_RELEASE_TABLET | Freq: Two times a day (BID) | ORAL | Status: DC
Start: 2014-07-07 — End: 2014-07-13
  Administered 2014-07-07 – 2014-07-13 (×13): 12 mg via ORAL
  Filled 2014-07-07 (×13): qty 1

## 2014-07-07 NOTE — Progress Notes (Signed)
Per NT patient stated she wanted to "no longer live".  This Chief Strategy Officer and [employer] RN went into patients room to follow-up.  Pt stated "I am tired of all of this.  If I wanted to end my life I would have already done so.  I am tired."  [employer] notified of above statements, sitter for suicide precautions initiated.

## 2014-07-07 NOTE — Progress Notes (Signed)
Patient saw bariatric coordinator in the hall and requested to speak with this RN.  Patient very anxious in reference to pending bariatric surgery, scared that she won't get to have the bariatric surgery and hiatal hernia repair.  Encouraged patient to not worry about upcoming bariatric surgery but to focus on healing from current surgery.  Offered support and encouragement, will continue to monitor.    Vilinda Flake RN  [phone]

## 2014-07-07 NOTE — Progress Notes (Signed)
Spoke with daughter Iven Finn  [phone] who resides in [place]. She was very concerned about her mother and disposition at discharge. Wanted to see if patient needed her here in  at discharge or if she could be transported to [place]. She has not spoken with her stepfather and unsure how much support he will be able to give her at discharge. Patient would like CSW to assist but patient and daughter do not feel like home with the husband is a good plan.

## 2014-07-07 NOTE — Progress Notes (Signed)
Patient made comments about ending her life. She said she didn't want to go through this anymore and would just like to take a handful of pills and end it all. Notified PA. Will continue to follow.

## 2014-07-07 NOTE — Progress Notes (Signed)
Telepsych consulted. No new orders and this time. Will continue to monitor.

## 2014-07-07 NOTE — Anesthesia Postprocedure Evaluation (Signed)
  Anesthesia Post-op Note  Patient: Alexandria [name]  Procedure(s) Performed: Procedure(s) (LRB): REPAIR OF INCARCERATED INCISIONAL HERNIA WITH MESH (N/A)  Patient Location: PACU  Anesthesia Type: General  Level of Consciousness: awake and alert   Airway and Oxygen Therapy: Patient Spontanous Breathing  Post-op Pain: mild  Post-op Assessment: Post-op Vital signs reviewed, Patient's Cardiovascular Status Stable, Respiratory Function Stable, Patent Airway and No signs of Nausea or vomiting  Last Vitals:  Filed Vitals:   07/07/14 0754  BP:   Pulse:   Temp:   Resp: 23    Post-op Vital Signs: stable   Complications: No apparent anesthesia complications

## 2014-07-07 NOTE — Telephone Encounter (Signed)
Husband called to let us know pt in the hospital and ask if I would cancel this appt pt had scheduled for fri.  She is in [place], had emergency surgery. Thank you.

## 2014-07-07 NOTE — Progress Notes (Signed)
Patient interviewed and examined, agree with PA note above. She is a little More calm this afternoon as I spoke with her. Her wound is clean. She is tolerating liquids. She has had a bowel movement. Edward Jolly MD, FACS  07/07/2014 5:43 PM

## 2014-07-07 NOTE — Telephone Encounter (Signed)
FYI

## 2014-07-07 NOTE — Plan of Care (Signed)
Problem: Phase II Progression Outcomes Goal: Pain controlled Outcome: Not Progressing Pt with complex pain mgmt needs.  Consult with PA multiple times during shift; psych being consulted.

## 2014-07-07 NOTE — Telephone Encounter (Signed)
Husband called to inform us that pt had emergency surgery and is in the hospital, [place]. Hernia surgery

## 2014-07-08 DIAGNOSIS — F332 Major depressive disorder, recurrent severe without psychotic features: Secondary | ICD-10-CM

## 2014-07-08 DIAGNOSIS — F411 Generalized anxiety disorder: Secondary | ICD-10-CM

## 2014-07-08 MED ORDER — QUETIAPINE FUMARATE 25 MG PO TABS
25.0000 mg | ORAL_TABLET | Freq: Two times a day (BID) | ORAL | Status: DC
  Administered 2014-07-08 – 2014-07-13 (×10): 25 mg via ORAL
  Filled 2014-07-08 (×11): qty 1

## 2014-07-08 MED ORDER — BENZOCAINE 10 % MT GEL
Freq: Four times a day (QID) | OROMUCOSAL | Status: DC | PRN
  Administered 2014-07-08: 11:00:00 via OROMUCOSAL
  Filled 2014-07-08: qty 9.4

## 2014-07-08 MED ORDER — NAPROXEN 500 MG PO TABS
500.0000 mg | ORAL_TABLET | Freq: Two times a day (BID) | ORAL | Status: DC | PRN
  Administered 2014-07-08: 500 mg via ORAL
  Filled 2014-07-08: qty 1

## 2014-07-08 MED ORDER — ACETAMINOPHEN 325 MG PO TABS
650.0000 mg | ORAL_TABLET | Freq: Four times a day (QID) | ORAL | Status: DC | PRN
  Administered 2014-07-08: 650 mg via ORAL
  Filled 2014-07-08: qty 2

## 2014-07-08 MED ORDER — ACETAMINOPHEN 650 MG RE SUPP: 650.0000 mg | Freq: Four times a day (QID) | RECTAL | Status: DC | PRN

## 2014-07-08 NOTE — Consult Note (Signed)
[facility] Face-to-Face Psychiatry Consult   Reason for Consult:  depression and suicidal ideations Referring Physician:  Holland Commons, PA-C   Alexandria [name] is an 63 y.o. female. Total Time spent with patient: 45 minutes  Assessment: AXIS I:  Generalized Anxiety Disorder and Major Depression, Recurrent severe AXIS II:  Deferred AXIS III:   Past Medical History  Diagnosis Date  . Iron deficiency anemia   . Status post chemotherapy     4 cycles of Taxotere and cytoxan  . Chronic back pain   . Hx of pulmonary embolus     During c-Section  . Neuropathy   . Obesity     Class 2  . S/P radiation therapy  03/04/2013-04/17/2013    1) Right breast / 50 Gy in 25 fractions/ 2) Right breast boost / 10 Gy in 5 fractions  . Diastolic heart failure   . Hx of radiation therapy 03/04/13- 04/17/13    right breast 50 Gy 25 fractions, right breast boost 10 Gy 5 fractions  . 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 Dyspnea on exertion   . CHF (congestive heart failure)   . Anxiety   . H/O hiatal hernia    AXIS IV:  other psychosocial or environmental problems, problems related to social environment and problems with primary support group AXIS V:  41-50 serious symptoms  Plan:  Case discussed at Holland Commons, PA-C Recommended to discontinue suicide watch and safety monitoring Seroquel 25 mg twice daily for mood and anxiety Follow up with her as clinically required Patient does not meet criteria for psychiatric inpatient admission. Supportive therapy provided about ongoing stressors. Appreciate psychiatric consultation Please contact [phone] if needs further assistance  Subjective:   Alexandria [name] is a 63 y.o. female patient admitted with depression and suicidal ideations, Abdominal pain, nausea and vomiting.  HPI: Patient is seen for a face-to-face psychiatry consultation and evaluation of  depression, anxiety and suicidal ideation. Patient reported she was scared to death when recommended major surgery and denied current suicidal, homicidal ideation, intention or plan. Patient reported psychiatric consultation was called in because he was emotional, dysphoric with severe abdominal pain yesterday on her medication is not helping to control the pain and she made a statement about suicide ideation instead of dealing with pain. Patient has no previous history of suicide attempts or family history of suicide. Patient has history of seeing psychologist while living in [place], [place] and denied psychiatric medication management as a inpatient or outpatient at anytime. Patient has been living with her husband who is currently unemployed and also reportedly suffering with bipolar disorder. 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 Patient has been out of work for 4 years secondary to her chronic back pain and also suffered with cancer. Patient has no history of alcohol abuse versus dependence and substance abuse. Patient wishes to live a longer and care for her grandchild.  Medical history: This is a 63 year old female with morbid obesity and previous history of partial colectomy and laparotomy for small bowel obstruction. She has a known ventral hernia. She has a large hiatal hernia it is actually scheduled for sleeve gastrectomy, repair of hiatal hernia and repair of ventral hernia in 1 week. She presents to the emergency room with 48 hours of worsening  generalized abdominal pain centrally located in the abdomen persistent nausea and vomiting. She has noted some increased swelling at the site of her umbilical hernia.   HPI Elements:   Location:  depression. Quality:  irritable and agitated. Severity:  moderate. Timing:  multiple medical and psychosocial .  Past Psychiatric History: Past Medical  History  Diagnosis Date  . Iron deficiency anemia   . Status post chemotherapy     4 cycles of Taxotere and cytoxan  . Chronic back pain   . Hx of pulmonary embolus     During c-Section  . Neuropathy   . Obesity     Class 2  . S/P radiation therapy  03/04/2013-04/17/2013    1) Right breast / 50 Gy in 25 fractions/ 2) Right breast boost / 10 Gy in 5 fractions  . Diastolic heart failure   . Hx of radiation therapy 03/04/13- 04/17/13    right breast 50 Gy 25 fractions, right breast boost 10 Gy 5 fractions  . 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 Dyspnea on exertion   . CHF (congestive heart failure)   . Anxiety   . H/O hiatal hernia     reports that she has never smoked. She has never used smokeless tobacco. She reports that she does not drink alcohol or use illicit drugs. Family History  Problem Relation Age of Onset  . Parkinson's disease Mother   . Emphysema Maternal Grandfather   . COPD Sister      Living Arrangements: Spouse/significant other   Abuse/Neglect [facility]) Physical Abuse: Denies Verbal Abuse: Denies Sexual Abuse: Denies Allergies:   Allergies  Allergen Reactions  . Contrast Media [Iodinated Diagnostic Agents] Anaphylaxis  . Albuterol Other (See Comments)    Anxious   . Prednisone Other (See Comments)    Pt gets very agitated when she takes high doses of steroids    ACT Assessment Complete:  NO Objective: Blood pressure 151/81, pulse 75, temperature 98.1 F (36.7 C), temperature source Oral, resp. rate 11, height _0  (1.727 m), weight 123.923 kg (273 lb 3.2 oz), last menstrual period 01/30/2005, SpO2 92.00%.Body mass index is 41.55 kg/(m^2). Results for orders placed during the hospital encounter of 07/04/14 (from the past 72 hour(s))  CBC     Status: Abnormal   Collection Time    07/06/14  3:45 AM      Result Value Ref Range   WBC 4.0  4.0 - 10.5  K/uL   RBC 3.55 (*) 3.87 - 5.11 MIL/uL   Hemoglobin 9.2 (*) 12.0 - 15.0 g/dL   Comment: DELTA CHECK NOTED     REPEATED TO VERIFY   HCT 29.6 (*) 36.0 - 46.0 %   MCV 83.4  78.0 - 100.0 fL   MCH 25.9 (*) 26.0 - 34.0 pg   MCHC 31.1  30.0 - 36.0 g/dL   RDW 16.5 (*) 11.5 - 15.5 %   Platelets 212  150 - 400 K/uL   Comment: DELTA CHECK NOTED     REPEATED TO VERIFY     SPECIMEN CHECKED FOR CLOTS  BASIC METABOLIC PANEL     Status: Abnormal   Collection Time    07/06/14  3:45 AM      Result Value Ref Range   Sodium 137  137 - 147 mEq/L   Potassium 3.7  3.7 - 5.3 mEq/L   Chloride 100  96 - 112 mEq/L   CO2 28  19 - 32 mEq/L   Glucose, Bld 91  70 - 99 mg/dL   BUN 12  6 - 23 mg/dL   Creatinine, Ser 0.87  0.50 - 1.10 mg/dL   Calcium 8.4  8.4 - 10.5 mg/dL   GFR calc non Af Amer 70 (*) >90 mL/min   GFR calc Af Amer 82 (*) >90 mL/min   Comment: (NOTE)     The eGFR has been calculated using the CKD EPI equation.     This calculation has not been validated in all clinical situations.     eGFR's persistently <90 mL/min signify possible Chronic Kidney     Disease.   Anion gap 9  5 - 15  CBC     Status: Abnormal   Collection Time    07/07/14  5:00 AM      Result Value Ref Range   WBC 4.7  4.0 - 10.5 K/uL   RBC 3.76 (*) 3.87 - 5.11 MIL/uL   Hemoglobin 9.7 (*) 12.0 - 15.0 g/dL   HCT 30.8 (*) 36.0 - 46.0 %   MCV 81.9  78.0 - 100.0 fL   MCH 25.8 (*) 26.0 - 34.0 pg   MCHC 31.5  30.0 - 36.0 g/dL   RDW 16.6 (*) 11.5 - 15.5 %   Platelets 251  150 - 400 K/uL  BASIC METABOLIC PANEL     Status: Abnormal   Collection Time    07/07/14  5:00 AM      Result Value Ref Range   Sodium 142  137 - 147 mEq/L   Potassium 4.1  3.7 - 5.3 mEq/L   Chloride 103  96 - 112 mEq/L   CO2 25  19 - 32 mEq/L   Glucose, Bld 70  70 - 99 mg/dL   BUN 12  6 - 23 mg/dL   Creatinine, Ser 0.84  0.50 - 1.10 mg/dL   Calcium 8.7  8.4 - 10.5 mg/dL   GFR calc non Af Amer 74 (*) >90 mL/min   GFR calc Af Amer 85 (*) >90 mL/min    Comment: (NOTE)     The eGFR has been calculated using the CKD EPI equation.     This calculation has not been validated in all clinical situations.     eGFR's persistently <90 mL/min signify possible Chronic Kidney     Disease.   Anion gap 14  5 - 15   Labs are reviewed.  Current Facility-Administered Medications  Medication Dose Route Frequency Provider Last Rate Last Dose  . 0.9 %  sodium chloride infusion   Intravenous Continuous Megan N Dort, PA-C 50 mL/hr at 07/08/14 1310    . acetaminophen (TYLENOL) suppository 650 mg  650 mg Rectal Q6H PRN Excell Seltzer, MD      . acetaminophen (TYLENOL) tablet 650 mg  650 mg Oral Q6H PRN Excell Seltzer, MD   650 mg at 07/08/14 1051  . albuterol (PROVENTIL) (2.5 MG/3ML) 0.083% nebulizer solution 3 mL  3 mL Inhalation Q6H PRN Excell Seltzer, MD      . anastrozole (ARIMIDEX) tablet 1 mg  1 mg Oral Daily Megan N Dort, PA-C   1 mg at 07/08/14 0956  . antiseptic oral rinse (CPC / CETYLPYRIDINIUM CHLORIDE 0.05%) solution 7 mL  7 mL Mouth Rinse BID Excell Seltzer, MD   7 mL at 07/08/14 0958  . atorvastatin (LIPITOR) tablet 20 mg  20 mg Oral Daily Holland Commons, PA-C  20 mg at 07/08/14 0957  . benzocaine (ORAJEL) 10 % mucosal gel   Mouth/Throat QID PRN Excell Seltzer, MD      . bisacodyl (DULCOLAX) suppository 10 mg  10 mg Rectal Daily Megan N Dort, PA-C      . diphenhydrAMINE (BENADRYL) injection 12.5 mg  12.5 mg Intravenous Q6H PRN Excell Seltzer, MD   12.5 mg at 07/08/14 0358   Or  . diphenhydrAMINE (BENADRYL) 12.5 MG/5ML elixir 12.5 mg  12.5 mg Oral Q6H PRN Excell Seltzer, MD      . docusate sodium (COLACE) capsule 100 mg  100 mg Oral BID Megan N Dort, PA-C   100 mg at 07/08/14 0957  . enoxaparin (LOVENOX) injection 40 mg  40 mg Subcutaneous Daily Excell Seltzer, MD   40 mg at 07/08/14 0955  . escitalopram (LEXAPRO) tablet 20 mg  20 mg Oral Daily Megan N Dort, PA-C   20 mg at 07/08/14 0957  . ferrous sulfate tablet 325 mg  325  mg Oral Q breakfast Megan N Dort, PA-C   325 mg at 07/08/14 0740  . fluticasone (FLOVENT HFA) 44 MCG/ACT inhaler 2 puff  2 puff Inhalation BID Excell Seltzer, MD   2 puff at 07/08/14 1205  . furosemide (LASIX) tablet 80 mg  80 mg Oral TID Megan N Dort, PA-C   80 mg at 07/08/14 0957  . heparin injection 5,000 Units  5,000 Units Subcutaneous Once Excell Seltzer, MD      . HYDROmorphone (DILAUDID) PCA injection 0.3 mg/mL   Intravenous 6 times per day Excell Seltzer, MD   2.7 mg at 07/08/14 1228  . HYDROmorphone HCl (EXALGO) SR tablet 12 mg  12 mg Oral BID Megan N Dort, PA-C   12 mg at 07/08/14 0957  . ketorolac (TORADOL) 30 MG/ML injection 30 mg  30 mg Intravenous Q6H PRN Nehemiah Massed Dort, PA-C   30 mg at 07/07/14 2227  . lidocaine-prilocaine (EMLA) cream   Topical PRN Jackolyn Confer, MD      . LORazepam (ATIVAN) tablet 1 mg  1 mg Oral QID Excell Seltzer, MD   1 mg at 07/08/14 1157  . lubiprostone (AMITIZA) capsule 24 mcg  24 mcg Oral BID WC Megan N Dort, PA-C   24 mcg at 07/08/14 0739  . magic mouthwash w/lidocaine  5 mL Oral QID PRN Megan N Dort, PA-C   5 mL at 07/07/14 1144  . methocarbamol (ROBAXIN) 1,000 mg in dextrose 5 % 50 mL IVPB  1,000 mg Intravenous Q8H Megan N Dort, PA-C   1,000 mg at 07/08/14 0955  . naloxone [facility]) injection 0.4 mg  0.4 mg Intravenous PRN Excell Seltzer, MD       And  . sodium chloride 0.9 % injection 9 mL  9 mL Intravenous PRN Excell Seltzer, MD      . ondansetron [facility]) tablet 4 mg  4 mg Oral Q6H PRN Excell Seltzer, MD       Or  . ondansetron (ZOFRAN) injection 4 mg  4 mg Intravenous Q6H PRN Excell Seltzer, MD      . phenol (CHLORASEPTIC) mouth spray 1 spray  1 spray Mouth/Throat PRN Excell Seltzer, MD      . polyethylene glycol (MIRALAX / GLYCOLAX) packet 17 g  17 g Oral BID Megan N Dort, PA-C   17 g at 07/07/14 1423  . potassium chloride SA (K-DUR,KLOR-CON) CR tablet 40 mEq  40 mEq Oral TID Julio Sicks, RPH   40 mEq at 07/08/14 8469  Psychiatric Specialty Exam: Physical Exam as per history and physical  Review of Systems  Constitutional: Positive for malaise/fatigue.  Gastrointestinal: Positive for abdominal pain.  Musculoskeletal: Positive for back pain and myalgias.  Neurological: Positive for weakness.  Psychiatric/Behavioral: Positive for depression and suicidal ideas. The patient is nervous/anxious.     Blood pressure 151/81, pulse 75, temperature 98.1 F (36.7 C), temperature source Oral, resp. rate 11, height _0  (1.727 m), weight 123.923 kg (273 lb 3.2 oz), last menstrual period 01/30/2005, SpO2 92.00%.Body mass index is 41.55 kg/(m^2).  General Appearance: Casual  Eye Contact::  Good  Speech:  Clear and Coherent  Volume:  Normal  Mood:  Anxious  Affect:  Appropriate and Congruent  Thought Process:  Coherent and Goal Directed  Orientation:  Full (Time, Place, and Person)  Thought Content:  WDL  Suicidal Thoughts:  No  Homicidal Thoughts:  No  Memory:  Immediate;   Fair Recent;   Fair  Judgement:  Intact  Insight:  Fair  Psychomotor Activity:  Decreased  Concentration:  Fair  Recall:  Good  Fund of Knowledge:Good  Language: Good  Akathisia:  NA  Handed:  Right  AIMS (if indicated):     Assets:  Communication Skills Desire for Improvement Financial Resources/Insurance Housing Intimacy Leisure Time Resilience Social Support Transportation  Sleep:      Musculoskeletal: Strength & Muscle Tone: decreased Gait & Station: unable to stand Patient leans: N/A  Treatment Plan Summary: Daily contact with patient to assess and evaluate symptoms and progress in treatment Medication management Recommended to discontinue suicide watch and safety monitoring Seroquel 25 mg twice daily for mood and anxiety Follow up with her as clinically required  Marley Pakula,JANARDHAHA R. 07/08/2014 1:21 PM

## 2014-07-08 NOTE — Progress Notes (Signed)
Patient interviewed and examined, agree with PA note above.  Edward Jolly MD, FACS  07/08/2014 2:40 PM

## 2014-07-08 NOTE — Progress Notes (Signed)
[facility] Progress Note  4 Days Post-Op  Subjective: Pt much better this am.  More calm.  Still having pain, but she says its much better since being back on her home meds.  No N/V.  Tolerating clears.  Has not been OOB.  Says she's "sorry about being suicidal yesterday"  She says "I never meant anything that I said", "I would never do that".  She is agreeable to talk to psych.  Objective: Vital signs in last 24 hours: Temp:  [97.8 F (36.6 C)-98.6 F (37 C)] 98.1 F (36.7 C) (10/21 1013) Pulse Rate:  [65-78] 75 (10/21 1013) Resp:  [15-25] 20 (10/21 1013) BP: (129-155)/(75-81) 151/81 mmHg (10/21 1013) SpO2:  [90 %-99 %] 96 % (10/21 1013) Last BM Date: 07/07/14  Intake/Output from previous day: 10/20 0701 - 10/21 0700 In: 2023.2 [I.V.:1843.2; IV Piggyback:180] Out: 2175 [Urine:2100; Drains:75] Intake/Output this shift: Total I/O In: -  Out: 10 [Drains:10]  PE: General: Anxious, obese white female who is laying in bed, much more calm thought today Abd: Obese, sofeter, less distended, tender over midline and drain, +BS, no masses, hernias, or organomegaly, midline wound C/D/I with staples intact, JP drain with serosanguinous drainage 42mL/24hr   Lab Results:   Recent Labs  07/06/14 0345 07/07/14 0500  WBC 4.0 4.7  HGB 9.2* 9.7*  HCT 29.6* 30.8*  PLT 212 251   BMET  Recent Labs  07/06/14 0345 07/07/14 0500  NA 137 142  K 3.7 4.1  CL 100 103  CO2 28 25  GLUCOSE 91 70  BUN 12 12  CREATININE 0.87 0.84  CALCIUM 8.4 8.7   PT/INR No results found for this basename: LABPROT, INR,  in the last 72 hours CMP     Component Value Date/Time   NA 142 07/07/2014 0500   NA 142 01/01/2014 1400   K 4.1 07/07/2014 0500   K 3.2* 01/01/2014 1400   CL 103 07/07/2014 0500   CL 99 03/04/2013 1239   CO2 25 07/07/2014 0500   CO2 30* 01/01/2014 1400   GLUCOSE 70 07/07/2014 0500   GLUCOSE 132 01/01/2014 1400   GLUCOSE 125* 03/04/2013 1239   BUN 12 07/07/2014 0500    BUN 17.3 01/01/2014 1400   CREATININE 0.84 07/07/2014 0500   CREATININE 1.0 01/01/2014 1400   CALCIUM 8.7 07/07/2014 0500   CALCIUM 9.8 01/01/2014 1400   PROT 7.7 07/04/2014 1928   PROT 7.7 01/01/2014 1400   ALBUMIN 3.5 07/04/2014 1928   ALBUMIN 3.7 01/01/2014 1400   AST 39* 07/04/2014 1928   AST 32 01/01/2014 1400   ALT 31 07/04/2014 1928   ALT 29 01/01/2014 1400   ALKPHOS 190* 07/04/2014 1928   ALKPHOS 155* 01/01/2014 1400   BILITOT 0.3 07/04/2014 1928   BILITOT 0.25 01/01/2014 1400   GFRNONAA 74* 07/07/2014 0500   GFRAA 85* 07/07/2014 0500   Lipase     Component Value Date/Time   LIPASE 22 12/11/2013 1444       Studies/Results: No results found.  Anti-infectives: Anti-infectives   Start     Dose/Rate Route Frequency Ordered Stop   07/05/14 0600  cefOXitin (MEFOXIN) 1 g in dextrose 5 % 50 mL IVPB     1 g 100 mL/hr over 30 Minutes Intravenous 4 times per day 07/05/14 0344 07/05/14 0628   07/04/14 2300  cefOXitin (MEFOXIN) 2 g in dextrose 5 % 50 mL IVPB     2 g 100 mL/hr over 30 Minutes Intravenous Once 07/04/14 2257  07/04/14 2344       Assessment/Plan Incarcerated ventral hernia with Small bowel obstruction  POD #3 S/p Repair of incarcerated ventral incisional hernia with mesh, 07/05/2014, Excell Seltzer, MD Chronic constipation  Hx of partial colectomy/laparotomy for SBO  Right breast cancer with radiation and chemotherapy/lumpectomy 2014  Hx of PE Chronic back pain on dilaudid 12 mg daily PRN and dilaudid XL 24 mg daily  Hx of anemia  Body mass index is 41.55 kg/(m^2).  Severe anxiety/anxiety attacks  Suicidal ideations/intent voiced   Plan:  1. Psych on board, note/recs not finished yet, but after discussion last night will give seroquel 50mg  prn up to 4x/day 2. Need her to mobilize OOB, ask PT to see her 3. She's having ear and tooth pain. Was pending OP dental follow up, no dentist on call, she will need OP f/u 4. Resumed home meds, will continue PCA  for now, will try to discontinue PCA tomorrow am and start on orals 5. Pt is a little more cooperative today with staff after dosage of seroquel, she has been able to get some sleep. 6. Change dressing daily  7. Hopefully home in a few days, pain and anxiety is her big barrier.  She says she has seen psychiatrist in the past.  She also says they have dismissed her from their practice in the past 8. Staples to come out at POD #10 9. Still needs sitter without exception, may need to be involuntary committed if she tries to leave the hospital until psych feels she is cleared.  Will await their recommendations.    LOS: 4 days    Coralie Keens 07/08/2014, 11:38 AM Pager: [phone]

## 2014-07-09 ENCOUNTER — Ambulatory Visit: Payer: Medicare Other | Admitting: Internal Medicine

## 2014-07-09 MED ORDER — HYDROMORPHONE HCL 2 MG PO TABS
4.0000 mg | ORAL_TABLET | Freq: Four times a day (QID) | ORAL | Status: DC | PRN
  Administered 2014-07-09: 4 mg via ORAL
  Filled 2014-07-09: qty 2

## 2014-07-09 MED ORDER — ACETAMINOPHEN 500 MG PO TABS
1000.0000 mg | ORAL_TABLET | Freq: Three times a day (TID) | ORAL | Status: DC
  Administered 2014-07-10 – 2014-07-13 (×11): 1000 mg via ORAL
  Filled 2014-07-09 (×13): qty 2

## 2014-07-09 MED ORDER — LIDOCAINE 5 % EX PTCH
1.0000 | MEDICATED_PATCH | Freq: Every day | CUTANEOUS | Status: DC
  Administered 2014-07-10 (×2): 2 via TRANSDERMAL
  Filled 2014-07-09 (×5): qty 2

## 2014-07-09 MED ORDER — LACTATED RINGERS IV BOLUS (SEPSIS): 1000.0000 mL | Freq: Three times a day (TID) | INTRAVENOUS | Status: AC | PRN

## 2014-07-09 MED ORDER — NAPROXEN 500 MG PO TABS
500.0000 mg | ORAL_TABLET | Freq: Two times a day (BID) | ORAL | Status: DC | PRN
  Administered 2014-07-12: 500 mg via ORAL
  Filled 2014-07-09 (×2): qty 1

## 2014-07-09 MED ORDER — HYDROMORPHONE HCL 2 MG PO TABS
4.0000 mg | ORAL_TABLET | ORAL | Status: DC | PRN
  Administered 2014-07-10 – 2014-07-13 (×15): 4 mg via ORAL
  Filled 2014-07-09 (×15): qty 2

## 2014-07-09 MED ORDER — METHOCARBAMOL 500 MG PO TABS
1000.0000 mg | ORAL_TABLET | Freq: Four times a day (QID) | ORAL | Status: DC
  Administered 2014-07-09 – 2014-07-13 (×16): 1000 mg via ORAL
  Filled 2014-07-09 (×21): qty 2

## 2014-07-09 MED ORDER — LIDOCAINE-PRILOCAINE 2.5-2.5 % EX CREA
1.0000 "application " | TOPICAL_CREAM | CUTANEOUS | Status: DC | PRN
  Filled 2014-07-09: qty 5

## 2014-07-09 MED ORDER — LORAZEPAM 0.5 MG PO TABS
0.5000 mg | ORAL_TABLET | Freq: Two times a day (BID) | ORAL | Status: DC | PRN
  Filled 2014-07-09 (×2): qty 1

## 2014-07-09 NOTE — Progress Notes (Signed)
Patient interviewed and examined, agree with PA note above.  Edward Jolly MD, FACS  07/09/2014 3:37 PM

## 2014-07-09 NOTE — Progress Notes (Signed)
[facility] Progress Note  5 Days Post-Op  Subjective: Pt doing well, no N/V. ambulating some OOB.  Tolerating full liquids.  Having BM's and flatus.  Wants to eat solid food.  Pt denies an SI today.  Now denying that she had SI at all.  Objective: Vital signs in last 24 hours: Temp:  [98.1 F (36.7 C)-98.6 F (37 C)] 98.6 F (37 C) (10/22 0500) Pulse Rate:  [60-75] 68 (10/22 0500) Resp:  [11-20] 15 (10/22 0500) BP: (147-168)/(70-93) 147/70 mmHg (10/22 0500) SpO2:  [91 %-97 %] 93 % (10/22 0500) FiO2 (%):  [36 %] 36 % (10/22 0420) Last BM Date: 07/08/14  Intake/Output from previous day: 10/21 0701 - 10/22 0700 In: 1540.8 [P.O.:960; I.V.:580.8] Out: 6660 [Urine:6600; Drains:60] Intake/Output this shift: Total I/O In: -  Out: 800 [Urine:800]  PE: Gen:  Alert, NAD, pleasant Abd: Obese, sofeter, less distended, tender over midline and drain, +BS, no masses, hernias, or organomegaly, midline wound C/D/I with staples intact, JP drain with serosanguinous drainage 3mL/24hr   Lab Results:   Recent Labs  07/07/14 0500  WBC 4.7  HGB 9.7*  HCT 30.8*  PLT 251   BMET  Recent Labs  07/07/14 0500  NA 142  K 4.1  CL 103  CO2 25  GLUCOSE 70  BUN 12  CREATININE 0.84  CALCIUM 8.7   PT/INR No results found for this basename: LABPROT, INR,  in the last 72 hours CMP     Component Value Date/Time   NA 142 07/07/2014 0500   NA 142 01/01/2014 1400   K 4.1 07/07/2014 0500   K 3.2* 01/01/2014 1400   CL 103 07/07/2014 0500   CL 99 03/04/2013 1239   CO2 25 07/07/2014 0500   CO2 30* 01/01/2014 1400   GLUCOSE 70 07/07/2014 0500   GLUCOSE 132 01/01/2014 1400   GLUCOSE 125* 03/04/2013 1239   BUN 12 07/07/2014 0500   BUN 17.3 01/01/2014 1400   CREATININE 0.84 07/07/2014 0500   CREATININE 1.0 01/01/2014 1400   CALCIUM 8.7 07/07/2014 0500   CALCIUM 9.8 01/01/2014 1400   PROT 7.7 07/04/2014 1928   PROT 7.7 01/01/2014 1400   ALBUMIN 3.5 07/04/2014 1928   ALBUMIN 3.7  01/01/2014 1400   AST 39* 07/04/2014 1928   AST 32 01/01/2014 1400   ALT 31 07/04/2014 1928   ALT 29 01/01/2014 1400   ALKPHOS 190* 07/04/2014 1928   ALKPHOS 155* 01/01/2014 1400   BILITOT 0.3 07/04/2014 1928   BILITOT 0.25 01/01/2014 1400   GFRNONAA 74* 07/07/2014 0500   GFRAA 85* 07/07/2014 0500   Lipase     Component Value Date/Time   LIPASE 22 12/11/2013 1444       Studies/Results: No results found.  Anti-infectives: Anti-infectives   Start     Dose/Rate Route Frequency Ordered Stop   07/05/14 0600  cefOXitin (MEFOXIN) 1 g in dextrose 5 % 50 mL IVPB     1 g 100 mL/hr over 30 Minutes Intravenous 4 times per day 07/05/14 0344 07/05/14 0628   07/04/14 2300  cefOXitin (MEFOXIN) 2 g in dextrose 5 % 50 mL IVPB     2 g 100 mL/hr over 30 Minutes Intravenous Once 07/04/14 2257 07/04/14 2344       Assessment/Plan Incarcerated ventral hernia with Small bowel obstruction  POD #4 S/p Repair of incarcerated ventral incisional hernia with mesh, 07/05/2014, Excell Seltzer, MD Chronic constipation  Hx of partial colectomy/laparotomy for SBO  Right breast cancer with radiation  and chemotherapy/lumpectomy 2014  Hx of PE Chronic back pain on dilaudid 12 mg SR BID and dilaudid 4mg  TID PRN Hx of anemia  Body mass index is 41.55 kg/(m^2).  Severe anxiety/anxiety attacks  Suicidal ideations/intent voiced - now resolved  Plan:  1. Psych on board, recommended Seroquel 25mg  BID, No longer needs sitter, no need for inpatient psych per Dr. Louretta Shorten 2. Need her to mobilize OOB, PT pending 3. She's having ear and tooth pain - this seems improved. Was pending OP dental follow up, no dentist on call, she will need OP f/u  4. Continue home meds, d/c PCA and start orals 5. Much more cooperative today, up OOB some 6. Change dressing daily  7. Hopefully home in a few days, pain and anxiety is her big barrier. She says she has seen psychiatrist in the past. She also says they have dismissed  her from their practice in the past  8. Staples to come out at POD #14, D/c JP drain tomorrow if output <84mL 9. Hopefully d/c tomorrow - home vs SNF     LOS: 5 days    DORT, Jinny Blossom 07/09/2014, 8:18 AM Pager: [phone]
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
## 2014-07-09 NOTE — Care Management Note (Signed)
    Page 1 of 2   07/13/2014     2:11:34 PM CARE MANAGEMENT NOTE 07/13/2014  Patient:  Alexandria [name], Alexandria [name]   Account Number:  [id number]  Date Initiated:  07/09/2014  Documentation initiated by:  Sunday Spillers  Subjective/Objective Assessment:   63 yo female admitted s/p ventral hernia repair. PTA lived at home with spouse.     Action/Plan:   Home when stable   Anticipated DC Date:  07/13/2014   Anticipated DC Plan:  [place]  CM consult      [facility] Choice  HOME HEALTH   Choice offered to / List presented to:  C-1 Patient        [facility] arranged  [facility]      [facility]   Status of service:  Completed, signed off Medicare Important Message given?  YES (If response is "NO", the following Medicare IM given date fields will be blank) Date Medicare IM given:  07/10/2014 Medicare IM given by:  [facility] Date Additional Medicare IM given:   Additional Medicare IM given by:    Discharge Disposition:  [place]  Per UR Regulation:  Reviewed for med. necessity/level of care/duration of stay  If discussed at [place] of Stay Meetings, dates discussed:    Comments:  07-10-14 [place] 1525 Spoke with patient at beside to f/u regarding [place]. Patient states she is waiting one her husband to decide and she will not d/c until am. Weekend CM to f/u.  07-09-14 Sunday Spillers RN CM [phone] Spoke with patient and spouse at bedside. Discussed [place] services, skilled services would be covered by insurance and personal care would be private pay. Discussed SNF option but patient did not think she wanted this at this time. Provided patient with [facility] list and private duty list. She will discuss with husband and I will f/u later.

## 2014-07-09 NOTE — Progress Notes (Signed)
PT Cancellation Note  Patient Details Name: Alexandria [name] MRN: [id number] DOB: [birth year]   Cancelled Treatment:    Reason Eval/Treat Not Completed: Fatigue/lethargy limiting ability to participate Checked back on pt again after lunch, and she requested therapy to come back at 2:30pm.  Called RN to check with pt if therapy would now be a good time, and pt reports she wants to start tomorrow.  Will check back as schedule permits.   Ellieana Dolecki,KATHrine E 07/09/2014, 2:36 PM Carmelia Bake, PT, DPT 07/09/2014 Pager: [phone]

## 2014-07-09 NOTE — Progress Notes (Signed)
PT Cancellation Note  Patient Details Name: [name] MRN: [id number] DOB: 1950/11/27   Cancelled Treatment:    Reason Eval/Treat Not Completed: Fatigue/lethargy limiting ability to participate Pt and nsg staff reports pt just ambulated in hallway.  Pt reports too fatigued at this time and would like therapy to check back.  Will check back as schedule permits.   Kyjuan Gause,KATHrine E 07/09/2014, 10:35 AM Carmelia Bake, PT, DPT 07/09/2014 Pager: [phone]

## 2014-07-09 NOTE — Consult Note (Signed)
[facility] Face-to-Face Psychiatry Consult follow up note  Reason for Consult:  depression and suicidal ideations Referring Physician:  Holland Commons, PA-C   Alexandria [name] is an 63 y.o. female. Total Time spent with patient: 45 minutes  Assessment: AXIS I:  Generalized Anxiety Disorder and Major Depression, Recurrent severe AXIS II:  Deferred AXIS III:   Past Medical History  Diagnosis Date  . Iron deficiency anemia   . Status post chemotherapy     4 cycles of Taxotere and cytoxan  . Chronic back pain   . Hx of pulmonary embolus     During c-Section  . Neuropathy   . Obesity     Class 2  . S/P radiation therapy  03/04/2013-04/17/2013    1) Right breast / 50 Gy in 25 fractions/ 2) Right breast boost / 10 Gy in 5 fractions  . Diastolic heart failure   . Hx of radiation therapy 03/04/13- 04/17/13    right breast 50 Gy 25 fractions, right breast boost 10 Gy 5 fractions  . 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 Dyspnea on exertion   . CHF (congestive heart failure)   . Anxiety   . H/O hiatal hernia    AXIS IV:  other psychosocial or environmental problems, problems related to social environment and problems with primary support group AXIS V:  41-50 serious symptoms  Plan:  Continue Seroquel 25 mg twice daily for mood and anxiety Follow up with her as clinically required Patient does not meet criteria for psychiatric inpatient admission. Supportive therapy provided about ongoing stressors. Appreciate psychiatric consultation Please contact [phone] if needs further assistance  Subjective:   Alexandria [name] is a 63 y.o. female patient admitted with depression and suicidal ideations, Abdominal pain, nausea and vomiting.  HPI: Patient is seen for a face-to-face psychiatry consultation and evaluation of depression, anxiety and suicidal ideation. Patient reported she was scared to death  when recommended major surgery and denied current suicidal, homicidal ideation, intention or plan. Patient reported psychiatric consultation was called in because he was emotional, dysphoric with severe abdominal pain yesterday on her medication is not helping to control the pain and she made a statement about suicide ideation instead of dealing with pain. Patient has no previous history of suicide attempts or family history of suicide. Patient has history of seeing psychologist while living in [place], [place] and denied psychiatric medication management as a inpatient or outpatient at anytime. Patient has been living with her husband who is currently unemployed and also reportedly suffering with bipolar disorder. 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 Patient has been out of work for 4 years secondary to her chronic back pain and also suffered with cancer. Patient has no history of alcohol abuse versus dependence and substance abuse. Patient wishes to live a longer and care for her grandchild.  Medical history: This is a 63 year old female with morbid obesity and previous history of partial colectomy and laparotomy for small bowel obstruction. She has a known ventral hernia. She has a large hiatal hernia it is actually scheduled for sleeve gastrectomy, repair of hiatal hernia and repair of ventral hernia in 1 week. She presents to the emergency room with 48 hours of worsening generalized abdominal pain centrally located in the abdomen persistent nausea and vomiting.  She has noted some increased swelling at the site of her umbilical hernia.   Interval history: Patient is seen for psychiatric consultation follow up. Patient has been compliant with her medication management especially Seroquel and tolerating well without side effects. Patient has no reported irritability, agitation and aggressive behavior since  yesterday. Patient reportedly slept about and has poor appetite. Patient husband is at bedside and has no reported incidents.  Past Psychiatric History: Past Medical History  Diagnosis Date  . Iron deficiency anemia   . Status post chemotherapy     4 cycles of Taxotere and cytoxan  . Chronic back pain   . Hx of pulmonary embolus     During c-Section  . Neuropathy   . Obesity     Class 2  . S/P radiation therapy  03/04/2013-04/17/2013    1) Right breast / 50 Gy in 25 fractions/ 2) Right breast boost / 10 Gy in 5 fractions  . Diastolic heart failure   . Hx of radiation therapy 03/04/13- 04/17/13    right breast 50 Gy 25 fractions, right breast boost 10 Gy 5 fractions  . 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 Dyspnea on exertion   . CHF (congestive heart failure)   . Anxiety   . H/O hiatal hernia     reports that she has never smoked. She has never used smokeless tobacco. She reports that she does not drink alcohol or use illicit drugs. Family History  Problem Relation Age of Onset  . Parkinson's disease Mother   . Emphysema Maternal Grandfather   . COPD Sister      Living Arrangements: Spouse/significant other   Abuse/Neglect [facility]) Physical Abuse: Denies Verbal Abuse: Denies Sexual Abuse: Denies Allergies:   Allergies  Allergen Reactions  . Contrast Media [Iodinated Diagnostic Agents] Anaphylaxis  . Albuterol Other (See Comments)    Anxious   . Prednisone Other (See Comments)    Pt gets very agitated when she takes high doses of steroids    Objective: Blood pressure 147/70, pulse 68, temperature 98.6 F (37 C), temperature source Axillary, resp. rate 16, height 5' 8"  (1.727 m), weight 123.923 kg (273 lb 3.2 oz), last menstrual period 01/30/2005, SpO2 99.00%.Body mass index is 41.55 kg/(m^2). Results for orders placed during the hospital encounter of 07/04/14 (from  the past 72 hour(s))  CBC     Status: Abnormal   Collection Time    07/07/14  5:00 AM      Result Value Ref Range   WBC 4.7  4.0 - 10.5 K/uL   RBC 3.76 (*) 3.87 - 5.11 MIL/uL   Hemoglobin 9.7 (*) 12.0 - 15.0 g/dL   HCT 30.8 (*) 36.0 - 46.0 %   MCV 81.9  78.0 - 100.0 fL   MCH 25.8 (*) 26.0 - 34.0 pg   MCHC 31.5  30.0 - 36.0 g/dL   RDW 16.6 (*) 11.5 - 15.5 %   Platelets 251  150 - 400 K/uL  BASIC METABOLIC PANEL     Status: Abnormal   Collection Time    07/07/14  5:00 AM      Result Value Ref Range   Sodium 142  137 - 147 mEq/L   Potassium 4.1  3.7 - 5.3 mEq/L   Chloride 103  96 - 112 mEq/L   CO2 25  19 - 32 mEq/L   Glucose, Bld  70  70 - 99 mg/dL   BUN 12  6 - 23 mg/dL   Creatinine, Ser 0.84  0.50 - 1.10 mg/dL   Calcium 8.7  8.4 - 10.5 mg/dL   GFR calc non Af Amer 74 (*) >90 mL/min   GFR calc Af Amer 85 (*) >90 mL/min   Comment: (NOTE)     The eGFR has been calculated using the CKD EPI equation.     This calculation has not been validated in all clinical situations.     eGFR's persistently <90 mL/min signify possible Chronic Kidney     Disease.   Anion gap 14  5 - 15   Labs are reviewed.  Current Facility-Administered Medications  Medication Dose Route Frequency Provider Last Rate Last Dose  . 0.9 %  sodium chloride infusion   Intravenous Continuous Megan N Dort, PA-C 10 mL/hr at 07/09/14 0820    . acetaminophen (TYLENOL) tablet 650 mg  650 mg Oral Q6H PRN Excell Seltzer, MD   650 mg at 07/08/14 1051  . albuterol (PROVENTIL) (2.5 MG/3ML) 0.083% nebulizer solution 3 mL  3 mL Inhalation Q6H PRN Excell Seltzer, MD      . anastrozole (ARIMIDEX) tablet 1 mg  1 mg Oral Daily Megan N Dort, PA-C   1 mg at 07/09/14 8185  . antiseptic oral rinse (CPC / CETYLPYRIDINIUM CHLORIDE 0.05%) solution 7 mL  7 mL Mouth Rinse BID Excell Seltzer, MD   7 mL at 07/09/14 0925  . atorvastatin (LIPITOR) tablet 20 mg  20 mg Oral Daily Megan N Dort, PA-C   20 mg at 07/09/14 6314  . benzocaine  (ORAJEL) 10 % mucosal gel   Mouth/Throat QID PRN Excell Seltzer, MD      . bisacodyl (DULCOLAX) suppository 10 mg  10 mg Rectal Daily Megan N Dort, PA-C      . docusate sodium (COLACE) capsule 100 mg  100 mg Oral BID Megan N Dort, PA-C   100 mg at 07/09/14 9702  . enoxaparin (LOVENOX) injection 40 mg  40 mg Subcutaneous Daily Excell Seltzer, MD   40 mg at 07/09/14 0926  . escitalopram (LEXAPRO) tablet 20 mg  20 mg Oral Daily Megan N Dort, PA-C   20 mg at 07/09/14 6378  . ferrous sulfate tablet 325 mg  325 mg Oral Q breakfast Megan N Dort, PA-C   325 mg at 07/09/14 0817  . fluticasone (FLOVENT HFA) 44 MCG/ACT inhaler 2 puff  2 puff Inhalation BID Excell Seltzer, MD   2 puff at 07/08/14 1205  . furosemide (LASIX) tablet 80 mg  80 mg Oral TID Nehemiah Massed Dort, PA-C   80 mg at 07/09/14 5885  . heparin injection 5,000 Units  5,000 Units Subcutaneous Once Excell Seltzer, MD      . HYDROmorphone (DILAUDID) tablet 4 mg  4 mg Oral Q6H PRN Nehemiah Massed Dort, PA-C   4 mg at 07/09/14 1106  . HYDROmorphone HCl (EXALGO) SR tablet 12 mg  12 mg Oral BID Megan N Dort, PA-C   12 mg at 07/09/14 0277  . lidocaine-prilocaine (EMLA) cream   Topical PRN Jackolyn Confer, MD      . LORazepam (ATIVAN) tablet 1 mg  1 mg Oral QID Excell Seltzer, MD   1 mg at 07/09/14 1107  . lubiprostone (AMITIZA) capsule 24 mcg  24 mcg Oral BID WC Megan N Dort, PA-C   24 mcg at 07/09/14 0817  . magic mouthwash w/lidocaine  5 mL Oral QID PRN Jinny Blossom  N Dort, PA-C   5 mL at 07/07/14 1144  . methocarbamol (ROBAXIN) tablet 1,000 mg  1,000 mg Oral QID Megan N Dort, PA-C   1,000 mg at 07/09/14 4193  . naproxen (NAPROSYN) tablet 500 mg  500 mg Oral BID PRN Excell Seltzer, MD      . ondansetron [facility]) tablet 4 mg  4 mg Oral Q6H PRN Excell Seltzer, MD       Or  . ondansetron (ZOFRAN) injection 4 mg  4 mg Intravenous Q6H PRN Excell Seltzer, MD      . phenol (CHLORASEPTIC) mouth spray 1 spray  1 spray Mouth/Throat PRN Excell Seltzer,  MD      . polyethylene glycol (MIRALAX / GLYCOLAX) packet 17 g  17 g Oral BID Megan N Dort, PA-C   17 g at 07/07/14 1423  . potassium chloride SA (K-DUR,KLOR-CON) CR tablet 40 mEq  40 mEq Oral TID Julio Sicks, RPH   40 mEq at 07/09/14 7902  . QUEtiapine (SEROQUEL) tablet 25 mg  25 mg Oral BID Durward Parcel, MD   25 mg at 07/09/14 4097    Psychiatric Specialty Exam: Physical Exam as per history and physical  Review of Systems  Constitutional: Positive for malaise/fatigue.  Gastrointestinal: Positive for abdominal pain.  Musculoskeletal: Positive for back pain and myalgias.  Neurological: Positive for weakness.  Psychiatric/Behavioral: Positive for depression and suicidal ideas. The patient is nervous/anxious.     Blood pressure 147/70, pulse 68, temperature 98.6 F (37 C), temperature source Axillary, resp. rate 16, height 5' 8"  (1.727 m), weight 123.923 kg (273 lb 3.2 oz), last menstrual period 01/30/2005, SpO2 99.00%.Body mass index is 41.55 kg/(m^2).  General Appearance: Casual  Eye Contact::  Good  Speech:  Clear and Coherent  Volume:  Normal  Mood:  Anxious  Affect:  Appropriate and Congruent  Thought Process:  Coherent and Goal Directed  Orientation:  Full (Time, Place, and Person)  Thought Content:  WDL  Suicidal Thoughts:  No  Homicidal Thoughts:  No  Memory:  Immediate;   Fair Recent;   Fair  Judgement:  Intact  Insight:  Fair  Psychomotor Activity:  Decreased  Concentration:  Fair  Recall:  Good  Fund of Knowledge:Good  Language: Good  Akathisia:  NA  Handed:  Right  AIMS (if indicated):     Assets:  Communication Skills Desire for Improvement Financial Resources/Insurance Housing Intimacy Leisure Time Resilience Social Support Transportation  Sleep:      Musculoskeletal: Strength & Muscle Tone: decreased Gait & Station: unable to stand Patient leans: N/A  Treatment Plan Summary: Daily contact with patient to assess and evaluate symptoms  and progress in treatment Medication management Continue Seroquel 25 mg twice daily for mood and anxiety Psychiatric consultation follow up with patient as clinically required  Deisha Stull,JANARDHAHA R. 07/09/2014 2:56 PM

## 2014-07-10 ENCOUNTER — Encounter (HOSPITAL_COMMUNITY): Payer: Self-pay | Admitting: Radiology

## 2014-07-10 ENCOUNTER — Ambulatory Visit: Payer: Medicare Other | Admitting: Radiation Oncology

## 2014-07-10 ENCOUNTER — Inpatient Hospital Stay (HOSPITAL_COMMUNITY): Payer: Medicare Other

## 2014-07-10 MED ORDER — NAPROXEN 500 MG PO TABS: 500.0000 mg | ORAL_TABLET | Freq: Two times a day (BID) | ORAL | Status: DC | PRN

## 2014-07-10 MED ORDER — QUETIAPINE FUMARATE 25 MG PO TABS: 25.0000 mg | ORAL_TABLET | Freq: Two times a day (BID) | ORAL | Status: DC

## 2014-07-10 MED ORDER — HYDROMORPHONE HCL 4 MG PO TABS: 4.0000 mg | ORAL_TABLET | Freq: Four times a day (QID) | ORAL | Status: DC | PRN

## 2014-07-10 MED ORDER — METHOCARBAMOL 500 MG PO TABS: 1000.0000 mg | ORAL_TABLET | Freq: Four times a day (QID) | ORAL | Status: DC | PRN

## 2014-07-10 MED ORDER — DSS 100 MG PO CAPS: 100.0000 mg | ORAL_CAPSULE | Freq: Two times a day (BID) | ORAL | Status: DC

## 2014-07-10 NOTE — Discharge Instructions (Signed)
[facility]      [facility] [phone]  OPEN ABDOMINAL SURGERY: POST OP INSTRUCTIONS  Always review your discharge instruction sheet given to you by the facility where your surgery was performed.  IF YOU HAVE DISABILITY OR FAMILY LEAVE FORMS, YOU MUST BRING THEM TO THE OFFICE FOR PROCESSING.  PLEASE DO NOT GIVE THEM TO YOUR DOCTOR.  1. A prescription for pain medication may be given to you upon discharge.  Take your pain medication as prescribed, if needed.  If narcotic pain medicine is not needed, then you may take acetaminophen (Tylenol) or ibuprofen (Advil) as needed. 2. Take your usually prescribed medications unless otherwise directed. 3. If you need a refill on your pain medication, please contact your pharmacy. They will contact our office to request authorization.  Prescriptions will not be filled after 5pm or on week-ends. 4. You should follow a light diet the first few days after arrival home, such as soup and crackers, pudding, etc.unless your doctor has advised otherwise. A high-fiber, low fat diet can be resumed as tolerated.   Be sure to include lots of fluids daily. Most patients will experience some swelling and bruising on the chest and neck area.  Ice packs will help.  Swelling and bruising can take several days to resolve 5. Most patients will experience some swelling and bruising in the area of the incision. Ice pack will help. Swelling and bruising can take several days to resolve..  6. It is common to experience some constipation if taking pain medication after surgery.  Increasing fluid intake and taking a stool softener will usually help or prevent this problem from occurring.  A mild laxative (Milk of Magnesia or Miralax) should be taken according to package directions if there are no bowel movements after 48 hours. 7.  You may have steri-strips (small skin tapes) in place directly over the incision.  These strips should be left on the skin for 7-10 days.  If your  surgeon used skin glue on the incision, you may shower in 24 hours.  The glue will flake off over the next 2-3 weeks.  Any sutures or staples will be removed at the office during your follow-up visit. You may find that a light gauze bandage over your incision may keep your staples from being rubbed or pulled. You may shower and replace the bandage daily. 8. ACTIVITIES:  You may resume regular (light) daily activities beginning the next day--such as daily self-care, walking, climbing stairs--gradually increasing activities as tolerated.  You may have sexual intercourse when it is comfortable.  Refrain from any heavy lifting or straining until approved by your doctor. a. You may drive when you no longer are taking prescription pain medication, you can comfortably wear a seatbelt, and you can safely maneuver your car and apply brakes b. Return to Work: ___________________________________ 9. You should see your doctor in the office for a follow-up appointment approximately two weeks after your surgery.  Make sure that you call for this appointment within a day or two after you arrive home to insure a convenient appointment time. OTHER INSTRUCTIONS:  _____________________________________________________________ _____________________________________________________________  WHEN TO CALL YOUR DOCTOR: 1. Fever over 101.0 2. Inability to urinate 3. Nausea and/or vomiting 4. Extreme swelling or bruising 5. Continued bleeding from incision. 6. Increased pain, redness, or drainage from the incision. 7. Difficulty swallowing or breathing 8. Muscle cramping or spasms. 9. Numbness or tingling in hands or feet or around lips.  The clinic staff is available to   answer your questions during regular business hours.  Please don't hesitate to call and ask to speak to one of the nurses if you have concerns.  For further questions, please visit [email or web address]   

## 2014-07-10 NOTE — Progress Notes (Signed)
[facility] Progress Note  6 Days Post-Op  Subjective: Doing well, pain well controlled.  No N/V.  Waiting on a BM.  Having good flatus.  Good UOP.  Not many complaints.  Asking if she can get her CT chest while she's here because she's missed it several times due to her current condition. Ambulating well.    Objective: Vital signs in last 24 hours: Temp:  [97.7 F (36.5 C)-98.9 F (37.2 C)] 98 F (36.7 C) (10/23 0552) Pulse Rate:  [78-102] 78 (10/23 0552) Resp:  [16-18] 18 (10/23 0552) BP: (101-150)/(62-86) 150/74 mmHg (10/23 0552) SpO2:  [90 %-98 %] 91 % (10/23 0839) Last BM Date: 07/08/14  Intake/Output from previous day: 10/22 0701 - 10/23 0700 In: 593.7 [P.O.:480; I.V.:113.7] Out: 3535 [Urine:3525; Drains:10] Intake/Output this shift: Total I/O In: 240 [P.O.:240] Out: 0   PE: Gen:  Alert, NAD, pleasant Abd: Obese, sofeter, less distended, tender over midline and drain, +BS, no masses, hernias, or organomegaly, midline wound C/D/I with staples intact, no dehiscence, JP drain with serosanguinous drainage 42mL/24hr   Lab Results:  No results found for this basename: WBC, HGB, HCT, PLT,  in the last 72 hours BMET No results found for this basename: NA, K, CL, CO2, GLUCOSE, BUN, CREATININE, CALCIUM,  in the last 72 hours PT/INR No results found for this basename: LABPROT, INR,  in the last 72 hours CMP     Component Value Date/Time   NA 142 07/07/2014 0500   NA 142 01/01/2014 1400   K 4.1 07/07/2014 0500   K 3.2* 01/01/2014 1400   CL 103 07/07/2014 0500   CL 99 03/04/2013 1239   CO2 25 07/07/2014 0500   CO2 30* 01/01/2014 1400   GLUCOSE 70 07/07/2014 0500   GLUCOSE 132 01/01/2014 1400   GLUCOSE 125* 03/04/2013 1239   BUN 12 07/07/2014 0500   BUN 17.3 01/01/2014 1400   CREATININE 0.84 07/07/2014 0500   CREATININE 1.0 01/01/2014 1400   CALCIUM 8.7 07/07/2014 0500   CALCIUM 9.8 01/01/2014 1400   PROT 7.7 07/04/2014 1928   PROT 7.7 01/01/2014 1400   ALBUMIN  3.5 07/04/2014 1928   ALBUMIN 3.7 01/01/2014 1400   AST 39* 07/04/2014 1928   AST 32 01/01/2014 1400   ALT 31 07/04/2014 1928   ALT 29 01/01/2014 1400   ALKPHOS 190* 07/04/2014 1928   ALKPHOS 155* 01/01/2014 1400   BILITOT 0.3 07/04/2014 1928   BILITOT 0.25 01/01/2014 1400   GFRNONAA 74* 07/07/2014 0500   GFRAA 85* 07/07/2014 0500   Lipase     Component Value Date/Time   LIPASE 22 12/11/2013 1444       Studies/Results: No results found.  Anti-infectives: Anti-infectives   Start     Dose/Rate Route Frequency Ordered Stop   07/05/14 0600  cefOXitin (MEFOXIN) 1 g in dextrose 5 % 50 mL IVPB     1 g 100 mL/hr over 30 Minutes Intravenous 4 times per day 07/05/14 0344 07/05/14 0628   07/04/14 2300  cefOXitin (MEFOXIN) 2 g in dextrose 5 % 50 mL IVPB     2 g 100 mL/hr over 30 Minutes Intravenous Once 07/04/14 2257 07/04/14 2344       Assessment/Plan Incarcerated ventral hernia with Small bowel obstruction  POD #6 S/p Repair of incarcerated ventral incisional hernia with mesh, 07/05/2014, Excell Seltzer, MD Chronic constipation  Hx of partial colectomy/laparotomy for SBO  Right breast cancer with radiation and chemotherapy/lumpectomy 2014  Hx of PE Chronic back pain  on dilaudid 12 mg SR BID and dilaudid 4mg  TID PRN  Hx of anemia  Body mass index is 41.55 kg/(m^2).  Severe anxiety/anxiety attacks - improved Suicidal ideations/intent voiced - now resolved  Thoracic lymphadenopathy  Plan:  1. Psych on board, recommended Seroquel 25mg  BID, No longer needs sitter, no need for inpatient psych per Dr. Louretta Shorten  2. Need her to mobilize OOB, PT pending  3. She's having ear and tooth pain - this seems improved. Was pending OP dental follow up, no dentist on call, she will need OP f/u  4. Continue home meds, on orals for pain only 5. Much more cooperative today, up OOB some  6. Change dressing daily  7. Seroquel working well, will need OP psych follow up 8. Staples to come out  at POD #14, D/c JP drain today 9. Hopefully d/c home tomorrow if has a BM 10. I have ordered her chest CT which was originally ordered by Dr. Gwenette Greet her pulmonologist, she has been unable to get it because of this recent problem.      LOS: 6 days    Coralie Keens 07/10/2014, 11:26 AM Pager: [phone]

## 2014-07-10 NOTE — Progress Notes (Signed)
Patient interviewed and examined, agree with PA note above.  Edward Jolly MD, FACS  07/10/2014 3:26 PM

## 2014-07-10 NOTE — Progress Notes (Signed)
PT Cancellation Note  Patient Details Name: TELIAH BUFFALO MRN: [id number] DOB: 1950/11/22   Cancelled Treatment:    Reason Eval/Treat Not Completed: Fatigue/lethargy limiting ability to participate. Attempted PT eval-pt declined to participate at this time. Requested PT check back later in the day. Will check back as schedule permits.    Weston Anna, MPT Pager: [phone]

## 2014-07-10 NOTE — Progress Notes (Signed)
PT Cancellation Note  Patient Details Name: KRYSTINA STRIETER MRN: [id number] DOB: 1951-07-14   Cancelled Treatment:    Reason Eval/Treat Not Completed: 2nd attempt to work with pt-pt states she is about to eat and requests PT check back another time. After giving pt time to eat, noticed pt being wheeled away for procedure.  Will have to check back another day for eval.    Weston Anna, MPT Pager: [phone]

## 2014-07-10 NOTE — Progress Notes (Signed)
CSW consulted for SNF placement. Pt is declining SNF at this time. CSW signing off.  Werner Lean LCSW [phone]

## 2014-07-11 MED ORDER — SODIUM CHLORIDE 0.9 % IJ SOLN: 10.0000 mL | INTRAMUSCULAR | Status: DC | PRN

## 2014-07-11 NOTE — Plan of Care (Signed)
Problem: Phase II Progression Outcomes Goal: Progressing with IS, TCDB Outcome: Progressing Inspiratory wheezes; receiving resp meds.  Problem: Phase III Progression Outcomes Goal: IV changed to normal saline lock Outcome: Not Applicable Date Met:  88/73/73 Pt with PAC; running KVO to maintain line.

## 2014-07-11 NOTE — Progress Notes (Signed)
7 Days Post-Op  Subjective: Vomited once yesterday and had only a small bm this am. Mild pain which is stable  Objective: Vital signs in last 24 hours: Temp:  [97.9 F (36.6 C)-98.5 F (36.9 C)] 98.5 F (36.9 C) (10/24 0520) Pulse Rate:  [81-116] 81 (10/24 0520) Resp:  [18] 18 (10/24 0520) BP: (125-150)/(68-93) 147/75 mmHg (10/24 0520) SpO2:  [92 %-97 %] 96 % (10/24 0520) FiO2 (%):  [21 %] 21 % (10/23 2016) Last BM Date: 07/11/14  Intake/Output from previous day: 10/23 0701 - 10/24 0700 In: 711.7 [P.O.:480; I.V.:231.7] Out: 910 [Urine:900; Drains:10] Intake/Output this shift: Total I/O In: -  Out: 350 [Urine:350]  Resp: clear to auscultation bilaterally Cardio: regular rate and rhythm GI: soft, mild tenderness. incision ok. good bs  Lab Results:  No results found for this basename: WBC, HGB, HCT, PLT,  in the last 72 hours BMET No results found for this basename: NA, K, CL, CO2, GLUCOSE, BUN, CREATININE, CALCIUM,  in the last 72 hours PT/INR No results found for this basename: LABPROT, INR,  in the last 72 hours ABG No results found for this basename: PHART, PCO2, PO2, HCO3,  in the last 72 hours  Studies/Results: Ct Chest Wo Contrast  07/10/2014   CLINICAL DATA:  Lymphadenopathy, shortness of Breath, hiatal hernia  EXAM: CT CHEST WITHOUT CONTRAST  TECHNIQUE: Multidetector CT imaging of the chest was performed following the standard protocol without IV contrast.  COMPARISON:  07/04/2014 and earlier studies  FINDINGS: Large hiatal hernia involving the gastric fundus. No pleural or pericardial effusion. Subcentimeter precarinal and prevascular lymph nodes. No definite hilar adenopathy, sensitivity decreased without IV contrast. Small-caliber left subclavian port catheter to the SVC. Left apical scarring stable since 10/02/2012. Scarring/atelectasis in the posterior right lower lobe image 37 stable. No new nodule or infiltrate. Spondylitic changes in the lower cervical and mid  thoracic spine. Sternum intact. Visualized portions of upper abdomen otherwise unremarkable. Surgical clips in the right breast.  IMPRESSION: 1. Large hiatal hernia. 2. Stable subcentimeter mediastinal lymph nodes. 3. Stable scarring in the left lung apex and posterior right lower lobe.   Electronically Signed   By: Arne Cleveland M.D.   On: 07/10/2014 16:34    Anti-infectives: Anti-infectives   Start     Dose/Rate Route Frequency Ordered Stop   07/05/14 0600  cefOXitin (MEFOXIN) 1 g in dextrose 5 % 50 mL IVPB     1 g 100 mL/hr over 30 Minutes Intravenous 4 times per day 07/05/14 0344 07/05/14 0628   07/04/14 2300  cefOXitin (MEFOXIN) 2 g in dextrose 5 % 50 mL IVPB     2 g 100 mL/hr over 30 Minutes Intravenous Once 07/04/14 2257 07/04/14 2344      Assessment/Plan: s/p Procedure(s): REPAIR OF INCARCERATED INCISIONAL HERNIA WITH MESH (N/A) would monitor her intake until she does not vomit Hopefully will be ready to go home tomorrow ambulate  LOS: 7 days    TOTH III,PAUL S 07/11/2014

## 2014-07-11 NOTE — Evaluation (Signed)
Physical Therapy Evaluation Patient Details Name: Alexandria [name] MRN: [id number] DOB: [birth year] Today's Date: 07/11/2014   History of Present Illness  Patient is a 63 year old female with morbid obesity and previous history of partial colectomy and laparotomy for small bowel obstruction. She has a known ventral hernia.  Now s/p Repair of incarcerated ventral incisional hernia with mesh  Clinical Impression  Patient presents with decreased tolerance to mobility due to deficits in PT problem list.  She will benefit from skilled PT in the acute setting to allow return home modified independent with HHPT.    Follow Up Recommendations Home health PT    Equipment Recommendations  Other (comment) (pt wants oxygen for home)    Recommendations for Other Services       Precautions / Restrictions Precautions Precautions: Fall      Mobility  Bed Mobility Overal bed mobility: Modified Independent             General bed mobility comments: from elevated HOB position  Transfers Overall transfer level: Modified independent Equipment used: None             General transfer comment: stood from bed and from commode unaided  Ambulation/Gait Ambulation/Gait assistance: Min guard;Supervision Ambulation Distance (Feet): 400 Feet Assistive device:  (frequent wall rail use) Gait Pattern/deviations: Step-through pattern;Decreased stride length;Shuffle     General Gait Details: increased lateral sway (penguin-like per patient)  Stairs            Wheelchair Mobility    Modified Rankin (Stroke Patients Only)       Balance Overall balance assessment: Needs assistance           Standing balance-Leahy Scale: Fair Standing balance comment: frequent reaching for wall rail, furniture in the room                             Pertinent Vitals/Pain Pain Assessment: 0-10 Pain Score: 7  Pain Location: abdomen Pain Intervention(s): Monitored during  session;Repositioned    Home Living Family/patient expects to be discharged to:: Private residence Living Arrangements: Spouse/significant other Available Help at Discharge: Family Type of Home: Apartment Home Access: Level entry     Home Layout: One level Home Equipment: Cane - single point      Prior Function Level of Independence: Independent               Hand Dominance        Extremity/Trunk Assessment               Lower Extremity Assessment: Generalized weakness         Communication   Communication: No difficulties  Cognition Arousal/Alertness: Awake/alert Behavior During Therapy: WFL for tasks assessed/performed Overall Cognitive Status: Within Functional Limits for tasks assessed                      General Comments      Exercises        Assessment/Plan    PT Assessment    PT Diagnosis Generalized weakness;Difficulty walking;Acute pain   PT Problem List    PT Treatment Interventions     PT Goals (Current goals can be found in the Care Plan section) Acute Rehab PT Goals Patient Stated Goal: To go home PT Goal Formulation: With patient Time For Goal Achievement: 07/18/14 Potential to Achieve Goals: Good    Frequency     Barriers to discharge  Co-evaluation               End of Session   Activity Tolerance: Patient tolerated treatment well Patient left: in bed;with call bell/phone within reach Nurse Communication: Other (comment) (need to monitor O2 next time walks for home O2)         Time: 9233-0076 PT Time Calculation (min): 20 min   Charges:   PT Evaluation $Initial PT Evaluation Tier I: 1 Procedure PT Treatments $Gait Training: 8-22 mins   PT G Codes:          Shaheer Bonfield,CYNDI 07/20/14, 4:56 PM Magda Kiel, [place] July 20, 2014

## 2014-07-12 MED ORDER — PANTOPRAZOLE SODIUM 40 MG IV SOLR
40.0000 mg | INTRAVENOUS | Status: DC
  Administered 2014-07-12 – 2014-07-13 (×2): 40 mg via INTRAVENOUS
  Filled 2014-07-12 (×2): qty 40

## 2014-07-12 NOTE — Progress Notes (Signed)
8 Days Post-Op  Subjective: Pt still notes a couple episodes of vomiting yesterday. She thinks it is the types of food she is picking  Objective: Vital signs in last 24 hours: Temp:  [98.1 F (36.7 C)-98.5 F (36.9 C)] 98.5 F (36.9 C) (10/25 0600) Pulse Rate:  [75-77] 75 (10/25 0600) Resp:  [18] 18 (10/25 0600) BP: (116-140)/(78-81) 116/80 mmHg (10/25 0600) SpO2:  [91 %-94 %] 93 % (10/25 0600) Last BM Date: 07/11/14  Intake/Output from previous day: 10/24 0701 - 10/25 0700 In: 1108.3 [P.O.:900; I.V.:208.3] Out: 2450 [Urine:2450] Intake/Output this shift:    Resp: clear to auscultation bilaterally Cardio: regular rate and rhythm GI: soft, moderate tenderness. good bm's  Lab Results:  No results found for this basename: WBC, HGB, HCT, PLT,  in the last 72 hours BMET No results found for this basename: NA, K, CL, CO2, GLUCOSE, BUN, CREATININE, CALCIUM,  in the last 72 hours PT/INR No results found for this basename: LABPROT, INR,  in the last 72 hours ABG No results found for this basename: PHART, PCO2, PO2, HCO3,  in the last 72 hours  Studies/Results: Ct Chest Wo Contrast  07/10/2014   CLINICAL DATA:  Lymphadenopathy, shortness of Breath, hiatal hernia  EXAM: CT CHEST WITHOUT CONTRAST  TECHNIQUE: Multidetector CT imaging of the chest was performed following the standard protocol without IV contrast.  COMPARISON:  07/04/2014 and earlier studies  FINDINGS: Large hiatal hernia involving the gastric fundus. No pleural or pericardial effusion. Subcentimeter precarinal and prevascular lymph nodes. No definite hilar adenopathy, sensitivity decreased without IV contrast. Small-caliber left subclavian port catheter to the SVC. Left apical scarring stable since 10/02/2012. Scarring/atelectasis in the posterior right lower lobe image 37 stable. No new nodule or infiltrate. Spondylitic changes in the lower cervical and mid thoracic spine. Sternum intact. Visualized portions of upper  abdomen otherwise unremarkable. Surgical clips in the right breast.  IMPRESSION: 1. Large hiatal hernia. 2. Stable subcentimeter mediastinal lymph nodes. 3. Stable scarring in the left lung apex and posterior right lower lobe.   Electronically Signed   By: Arne Cleveland M.D.   On: 07/10/2014 16:34    Anti-infectives: Anti-infectives   Start     Dose/Rate Route Frequency Ordered Stop   07/05/14 0600  cefOXitin (MEFOXIN) 1 g in dextrose 5 % 50 mL IVPB     1 g 100 mL/hr over 30 Minutes Intravenous 4 times per day 07/05/14 0344 07/05/14 0628   07/04/14 2300  cefOXitin (MEFOXIN) 2 g in dextrose 5 % 50 mL IVPB     2 g 100 mL/hr over 30 Minutes Intravenous Once 07/04/14 2257 07/04/14 2344      Assessment/Plan: s/p Procedure(s): REPAIR OF INCARCERATED INCISIONAL HERNIA WITH MESH (N/A) Advance diet Continue to monitor. If she vomits again will get abd xray  LOS: 8 days    TOTH III,Doretha Goding S 07/12/2014

## 2014-07-13 ENCOUNTER — Encounter (HOSPITAL_COMMUNITY): Admission: RE | Payer: Self-pay | Source: Ambulatory Visit

## 2014-07-13 ENCOUNTER — Inpatient Hospital Stay (HOSPITAL_COMMUNITY): Admission: RE | Admit: 2014-07-13 | Payer: Medicare Other | Source: Ambulatory Visit | Admitting: Surgery

## 2014-07-13 ENCOUNTER — Encounter (HOSPITAL_COMMUNITY): Payer: Self-pay | Admitting: General Surgery

## 2014-07-13 SURGERY — GASTRECTOMY, SLEEVE, LAPAROSCOPIC, WITH HIATAL HERNIA REPAIR
Anesthesia: General

## 2014-07-13 MED ORDER — FLEET ENEMA 7-19 GM/118ML RE ENEM: 1.0000 | ENEMA | Freq: Once | RECTAL | Status: DC

## 2014-07-13 MED ORDER — HEPARIN SOD (PORK) LOCK FLUSH 100 UNIT/ML IV SOLN
500.0000 [IU] | INTRAVENOUS | Status: AC | PRN
  Administered 2014-07-13: 500 [IU]

## 2014-07-13 NOTE — Progress Notes (Signed)
Patient seen and examined.  Agree with PA's note.  

## 2014-07-13 NOTE — Progress Notes (Signed)
Nurse reviewed discharge instructions with pt and her husband.  Pt verbalized understanding of discharge instructions, follow up appointments, incision care and new medications.  Port a cath was deaccessed by IV team.  Pt was given valuables that were locked up in security office upon discharge.  No concerns at time of discharge.

## 2014-07-13 NOTE — Progress Notes (Signed)
[facility] Discharge Summary   Patient ID: Alexandria [name] MRN: [id number] DOB/AGE: May 05, 1951 63 y.o.  Admit date: 07/04/2014 Discharge date: 07/13/2014  Admitting Diagnosis: Ventral incisional hernia with obstruction Morbid obesity  Lymphedema Anxiety Depression Hiatal hernia   Discharge Diagnosis Patient Active Problem List   Diagnosis Date Noted  . Ventral incisional hernia with obstruction 07/05/2014  . Coronary artery calcification 05/22/2014  . Mediastinal lymphadenopathy 01/29/2014  . DOE (dyspnea on exertion) 01/29/2014  . Ventral hernia 01/23/2014  . Hiatal hernia 01/23/2014  . Symptomatic anemia 11/19/2013  . Hx of radiation therapy   . Protein-calorie malnutrition, severe 04/19/2013  . SVT (supraventricular tachycardia) 04/19/2013  . Anasarca 04/15/2013  . Vocal cord dysfunction 04/15/2013  . Acute on chronic diastolic CHF (congestive heart failure) 04/14/2013  . Anxiety state, unspecified 04/14/2013  . Hypokalemia 04/13/2013  . AKI (acute kidney injury) 04/13/2013  . Acute-on-chronic respiratory failure 04/13/2013  . Anemia 04/13/2013  . Hypertension 04/08/2013  . Chronic back pain 04/08/2013  . Morbid obesity 04/08/2013  . Adjustment disorder with mixed anxiety and depressed mood 04/08/2013  . Lower extremity edema 04/08/2013  . Systolic murmur 17/79/3903  . Chronic constipation 04/08/2013  . Malignant neoplasm of upper-outer quadrant of female breast 02/21/2013    Consultants Psych - Dr. Louretta Shorten  Imaging: No results found.  Procedures Dr. Excell Seltzer (07/05/14) - Repair of incarcerated ventral incisional hernia with mesh   Hospital Course:  63 year old female with morbid obesity and previous history of partial colectomy and laparotomy for small bowel obstruction. She has a known ventral hernia. She has a large hiatal hernia it is actually scheduled for sleeve gastrectomy, repair of hiatal hernia and repair of ventral hernia in 1  week. She presents to the emergency room with 48 hours of worsening generalized abdominal pain centrally located in the abdomen persistent nausea and vomiting. She has noted some increased swelling at the site of her umbilical hernia.  Workup showed incarcerated ventral hernia.  Patient was admitted and underwent procedure listed above.  Tolerated procedure well and was transferred ICU.  She had severe pain control issues after surgery for several days.  She also had severe agitation and anxiety.  She was transferred to the floor.  Once we were able to d/c her NG tube and start a diet she was able to be resumed on her home PO meds.  During the difficulty of getting her pain controlled she threatened suicidal ideations and intent/plan.  Psych consulted on her and they recommended starting her on seroquel.  She was monitored by a 24hr suicide sitter.  Soon after starting the medications she said she was no longer suicidal and "would never do that".  She was again seen by psych who cleared her and denied need for inpatient hospitalization.  More recently she has denied she even made those suicidal statements.  She continued to remain suicidal ideation/intent/plan free.  Diet was advanced as tolerated.  Her JP drain was removed on Friday 07/10/14.  We obtained a CT scan since she was unable to get one due to this admission.  The CT chest was originally ordered by her Pulmonologist to follow up on LAD of her thorax.  She had some trouble over the weekend with vomiting, but she relates that to certain foods she was eating.  She has been having BM's and pain well controlled.  On POD #8, the patient was voiding well, tolerating diet, ambulating well, pain well controlled, vital signs stable, incisions c/d/i and felt  stable for discharge home.  Patient will follow up in our office in 1 week for staple removal  (POD #14 at least) and in 2-3 weeks for postop check and knows to call with questions or concerns. PT recommended  HH PT which we will set up at home as well.  She will follow up with her pulmonologist Dr. Gwenette Greet regarding her CT Chest results and her concern for need for home O2.     Physical Exam: General:  Alert, NAD, pleasant, comfortable Abd:  Obese, soft, ND, mild tenderness, incisions C/D/I with staples in place     Medication List         albuterol 108 (90 BASE) MCG/ACT inhaler  Commonly known as:  PROVENTIL HFA;VENTOLIN HFA  Inhale 2 puffs into the lungs every 6 (six) hours as needed for wheezing or shortness of breath (wheezing).     anastrozole 1 MG tablet  Commonly known as:  ARIMIDEX  Take 1 mg by mouth daily.     atorvastatin 20 MG tablet  Commonly known as:  LIPITOR  Take 1 tablet (20 mg total) by mouth daily.     budesonide 180 MCG/ACT inhaler  Commonly known as:  PULMICORT  Inhale 2 puffs into the lungs daily as needed (shortness of breath).     DSS 100 MG Caps  Take 100 mg by mouth 2 (two) times daily.     escitalopram 20 MG tablet  Commonly known as:  LEXAPRO  Take 1 tablet (20 mg total) by mouth daily.     ferrous sulfate 325 (65 FE) MG tablet  Take 325 mg by mouth daily with breakfast.     furosemide 80 MG tablet  Commonly known as:  LASIX  Take 80 mg by mouth 3 (three) times daily.     HAIR/SKIN/NAILS PO  Take 1 tablet by mouth every morning.     HYDROmorphone HCl 12 MG T24a SR tablet  Commonly known as:  EXALGO  Take 12 mg by mouth 2 (two) times daily.     HYDROmorphone 4 MG tablet  Commonly known as:  DILAUDID  Take 1 tablet (4 mg total) by mouth every 6 (six) hours as needed for severe pain (pain).     levalbuterol 0.63 MG/3ML nebulizer solution  Commonly known as:  XOPENEX  Take 0.63 mg by nebulization every 4 (four) hours as needed for wheezing or shortness of breath (shortness of breath).     lidocaine 5 %  Commonly known as:  LIDODERM  Place 1-2 patches onto the skin daily. Remove & Discard patch within 12 hours or as directed by MD      lidocaine-prilocaine cream  Commonly known as:  EMLA  Apply 1 application topically as needed (port access).     LORazepam 0.5 MG tablet  Commonly known as:  ATIVAN  Take 0.5 mg by mouth 2 (two) times daily as needed for anxiety (anxiety).     lubiprostone 24 MCG capsule  Commonly known as:  AMITIZA  Take 1 capsule (24 mcg total) by mouth 2 (two) times daily with a meal.     methocarbamol 500 MG tablet  Commonly known as:  ROBAXIN  Take 2 tablets (1,000 mg total) by mouth every 6 (six) hours as needed for muscle spasms.     naproxen 500 MG tablet  Commonly known as:  NAPROSYN  Take 1 tablet (500 mg total) by mouth 2 (two) times daily as needed for headache (if response to Tylenol insufficient).  potassium chloride 20 MEQ packet  Commonly known as:  KLOR-CON  Take 40 mEq by mouth 3 (three) times daily.     QUEtiapine 25 MG tablet  Commonly known as:  SEROQUEL  Take 1 tablet (25 mg total) by mouth 2 (two) times daily.         Follow-up Information   Follow up with CCS,MD, MD. Schedule an appointment as soon as possible for a visit on 07/21/2014. (For a nurse visit for staple removal)    Specialty:  General Surgery      Follow up with HOXWORTH,BENJAMIN T, MD. Schedule an appointment as soon as possible for a visit in 3 weeks. (For post-operation check)    Specialty:  General Surgery   Contact information:   [place] [address] [phone]       Follow up with Pedro Earls, MD In 1 month. (For post-hospital follow up)    Specialty:  General Surgery   Contact information:   [address] [place] [address] [phone]       Follow up with Kathee Delton, MD. Schedule an appointment as soon as possible for a visit in 2 weeks. (For post-hospital follow up - regarding your CT Chest results and home oxygen concerns)    Specialty:  Pulmonary Disease   Contact information:   [place]  [address] [phone]       Follow up with Nyoka Cowden, MD. Schedule an appointment as soon as possible for a visit in 2 weeks. (For post-hospital follow up)    Specialty:  Internal Medicine   Contact information:   [place] [address] [phone]       Signed: Coralie Keens, [facility] [phone]  07/13/2014, 9:26 AM

## 2014-07-14 ENCOUNTER — Telehealth: Payer: Self-pay | Admitting: Pulmonary Disease

## 2014-07-14 NOTE — Discharge Summary (Signed)
Agree with summary. 

## 2014-07-14 NOTE — Discharge Summary (Signed)
[facility]  Discharge Summary   Patient ID:  Alexandria [name]  MRN: [id number]  DOB/AGE: 1951/04/24 63 y.o.   Admit date: 07/04/2014  Discharge date: 07/13/2014   Admitting Diagnosis:  Ventral incisional hernia with obstruction  Morbid obesity  Lymphedema  Anxiety  Depression  Hiatal hernia   Discharge Diagnosis  Patient Active Problem List    Diagnosis  Date Noted   .  Ventral incisional hernia with obstruction  07/05/2014   .  Coronary artery calcification  05/22/2014   .  Mediastinal lymphadenopathy  01/29/2014   .  DOE (dyspnea on exertion)  01/29/2014   .  Ventral hernia  01/23/2014   .  Hiatal hernia  01/23/2014   .  Symptomatic anemia  11/19/2013   .  Hx of radiation therapy    .  Protein-calorie malnutrition, severe  04/19/2013   .  SVT (supraventricular tachycardia)  04/19/2013   .  Anasarca  04/15/2013   .  Vocal cord dysfunction  04/15/2013   .  Acute on chronic diastolic CHF (congestive heart failure)  04/14/2013   .  Anxiety state, unspecified  04/14/2013   .  Hypokalemia  04/13/2013   .  AKI (acute kidney injury)  04/13/2013   .  Acute-on-chronic respiratory failure  04/13/2013   .  Anemia  04/13/2013   .  Hypertension  04/08/2013   .  Chronic back pain  04/08/2013   .  Morbid obesity  04/08/2013   .  Adjustment disorder with mixed anxiety and depressed mood  04/08/2013   .  Lower extremity edema  04/08/2013   .  Systolic murmur  32/20/2542   .  Chronic constipation  04/08/2013   .  Malignant neoplasm of upper-outer quadrant of female breast  02/21/2013    Consultants  Psych - Dr. Louretta Shorten   Imaging:  No results found.   Procedures  Dr. Excell Seltzer (07/05/14) - Repair of incarcerated ventral incisional hernia with mesh    Hospital Course:  62 year old female with morbid obesity and previous history of partial colectomy and laparotomy for small bowel obstruction. She has a known ventral hernia. She has a large hiatal hernia it is  actually scheduled for sleeve gastrectomy, repair of hiatal hernia and repair of ventral hernia in 1 week. She presents to the emergency room with 48 hours of worsening generalized abdominal pain centrally located in the abdomen persistent nausea and vomiting. She has noted some increased swelling at the site of her umbilical hernia.  Workup showed incarcerated ventral hernia. Patient was admitted and underwent procedure listed above. Tolerated procedure well and was transferred ICU. She had severe pain control issues after surgery for several days. She also had severe agitation and anxiety. She was transferred to the floor. Once we were able to d/c her NG tube and start a diet she was able to be resumed on her home PO meds. During the difficulty of getting her pain controlled she threatened suicidal ideations and intent/plan. Psych consulted on her and they recommended starting her on seroquel. She was monitored by a 24hr suicide sitter. Soon after starting the medications she said she was no longer suicidal and "would never do that". She was again seen by psych who cleared her and denied need for inpatient hospitalization. More recently she has denied she even made those suicidal statements. She continued to remain suicidal ideation/intent/plan free. Diet was advanced as tolerated. Her JP drain was removed on Friday 07/10/14. We obtained a CT scan  since she was unable to get one due to this admission. The CT chest was originally ordered by her Pulmonologist to follow up on LAD of her thorax. She had some trouble over the weekend with vomiting, but she relates that to certain foods she was eating. She has been having BM's and pain well controlled. On POD #8, the patient was voiding well, tolerating diet, ambulating well, pain well controlled, vital signs stable, incisions c/d/i and felt stable for discharge home. Patient will follow up in our office in 1 week for staple removal (POD #14 at least) and in 2-3 weeks  for postop check and knows to call with questions or concerns. PT recommended HH PT which we will set up at home as well. She will follow up with her pulmonologist Dr. Gwenette Greet regarding her CT Chest results and her concern for need for home O2.    Physical Exam:  General: Alert, NAD, pleasant, comfortable  Abd: Obese, soft, ND, mild tenderness, incisions C/D/I with staples in place       Medication List         albuterol 108 (90 BASE) MCG/ACT inhaler    Commonly known as: PROVENTIL HFA;VENTOLIN HFA    Inhale 2 puffs into the lungs every 6 (six) hours as needed for wheezing or shortness of breath (wheezing).    anastrozole 1 MG tablet    Commonly known as: ARIMIDEX    Take 1 mg by mouth daily.    atorvastatin 20 MG tablet    Commonly known as: LIPITOR    Take 1 tablet (20 mg total) by mouth daily.    budesonide 180 MCG/ACT inhaler    Commonly known as: PULMICORT    Inhale 2 puffs into the lungs daily as needed (shortness of breath).    DSS 100 MG Caps    Take 100 mg by mouth 2 (two) times daily.    escitalopram 20 MG tablet    Commonly known as: LEXAPRO    Take 1 tablet (20 mg total) by mouth daily.    ferrous sulfate 325 (65 FE) MG tablet    Take 325 mg by mouth daily with breakfast.    furosemide 80 MG tablet    Commonly known as: LASIX    Take 80 mg by mouth 3 (three) times daily.    HAIR/SKIN/NAILS PO    Take 1 tablet by mouth every morning.    HYDROmorphone HCl 12 MG T24a SR tablet    Commonly known as: EXALGO    Take 12 mg by mouth 2 (two) times daily.    HYDROmorphone 4 MG tablet    Commonly known as: DILAUDID    Take 1 tablet (4 mg total) by mouth every 6 (six) hours as needed for severe pain (pain).    levalbuterol 0.63 MG/3ML nebulizer solution    Commonly known as: XOPENEX    Take 0.63 mg by nebulization every 4 (four) hours as needed for wheezing or shortness of breath (shortness of breath).    lidocaine 5 %    Commonly known as: LIDODERM    Place 1-2 patches  onto the skin daily. Remove & Discard patch within 12 hours or as directed by MD    lidocaine-prilocaine cream    Commonly known as: EMLA    Apply 1 application topically as needed (port access).    LORazepam 0.5 MG tablet    Commonly known as: ATIVAN    Take 0.5 mg by mouth 2 (two) times daily as  needed for anxiety (anxiety).    lubiprostone 24 MCG capsule    Commonly known as: AMITIZA    Take 1 capsule (24 mcg total) by mouth 2 (two) times daily with a meal.    methocarbamol 500 MG tablet    Commonly known as: ROBAXIN    Take 2 tablets (1,000 mg total) by mouth every 6 (six) hours as needed for muscle spasms.    naproxen 500 MG tablet    Commonly known as: NAPROSYN    Take 1 tablet (500 mg total) by mouth 2 (two) times daily as needed for headache (if response to Tylenol insufficient).    potassium chloride 20 MEQ packet    Commonly known as: KLOR-CON    Take 40 mEq by mouth 3 (three) times daily.    QUEtiapine 25 MG tablet    Commonly known as: SEROQUEL    Take 1 tablet (25 mg total) by mouth 2 (two) times daily.      Follow-up Information    Follow up with CCS,MD, MD. Schedule an appointment as soon as possible for a visit on 07/21/2014. (For a nurse visit for staple removal)    Specialty: General Surgery       Follow up with HOXWORTH,BENJAMIN T, MD. Schedule an appointment as soon as possible for a visit in 3 weeks. (For post-operation check)    Specialty: General Surgery    Contact information:    [place] [address]  [phone]       Follow up with Pedro Earls, MD In 1 month. (For post-hospital follow up)    Specialty: General Surgery    Contact information:    [address]  [place] [address]  [phone]       Follow up with Kathee Delton, MD. Schedule an appointment as soon as possible for a visit in 2 weeks. (For post-hospital follow up - regarding your CT Chest results and home oxygen concerns)    Specialty:  Pulmonary Disease    Contact information:    [place] [address]  [phone]       Follow up with Nyoka Cowden, MD. Schedule an appointment as soon as possible for a visit in 2 weeks. (For post-hospital follow up)    Specialty: Internal Medicine    Contact information:    [place] [address]  [phone]      Signed:  Coralie Keens, [facility]  [phone]  07/13/2014, 9:26 AM

## 2014-07-14 NOTE — Telephone Encounter (Signed)
[email or web address].Dylynn Ketner, CMA  

## 2014-07-15 NOTE — Telephone Encounter (Signed)
Called, spoke with pt - Discussed below results and recs per Dr. Gwenette Greet with pt.  She verbalized understanding.

## 2014-07-15 NOTE — Telephone Encounter (Signed)
Let pt know this was never sent to my box for review.  Obviously a computer error. The good news is that it is completely stable, and nothing to suggest her lymph nodes are an issue.  No need to f/u scanning from that standpoint.

## 2014-07-15 NOTE — Telephone Encounter (Signed)
LMTCB

## 2014-07-15 NOTE — Telephone Encounter (Signed)
Pt seen in [facility], CT Chest completed, pt wanting to know if this can be resulted by Dr Gwenette Greet.  Please advise. Thanks.

## 2014-07-15 NOTE — Telephone Encounter (Signed)
Pt returned call & can be reached at [phone].  Alexandria [name]

## 2014-07-20 DIAGNOSIS — K436 Other and unspecified ventral hernia with obstruction, without gangrene: Secondary | ICD-10-CM | POA: Diagnosis not present

## 2014-07-20 DIAGNOSIS — I10 Essential (primary) hypertension: Secondary | ICD-10-CM | POA: Diagnosis not present

## 2014-07-20 DIAGNOSIS — Z48815 Encounter for surgical aftercare following surgery on the digestive system: Secondary | ICD-10-CM | POA: Diagnosis not present

## 2014-07-20 DIAGNOSIS — I890 Lymphedema, not elsewhere classified: Secondary | ICD-10-CM | POA: Diagnosis not present

## 2014-07-20 DIAGNOSIS — F341 Dysthymic disorder: Secondary | ICD-10-CM | POA: Diagnosis not present

## 2014-07-20 DIAGNOSIS — I5032 Chronic diastolic (congestive) heart failure: Secondary | ICD-10-CM | POA: Diagnosis not present

## 2014-07-23 DIAGNOSIS — M5417 Radiculopathy, lumbosacral region: Secondary | ICD-10-CM | POA: Diagnosis not present

## 2014-07-23 DIAGNOSIS — M5137 Other intervertebral disc degeneration, lumbosacral region: Secondary | ICD-10-CM | POA: Diagnosis not present

## 2014-07-23 DIAGNOSIS — M791 Myalgia: Secondary | ICD-10-CM | POA: Diagnosis not present

## 2014-07-23 DIAGNOSIS — M79609 Pain in unspecified limb: Secondary | ICD-10-CM | POA: Diagnosis not present

## 2014-07-23 DIAGNOSIS — M1990 Unspecified osteoarthritis, unspecified site: Secondary | ICD-10-CM | POA: Diagnosis not present

## 2014-07-23 DIAGNOSIS — R109 Unspecified abdominal pain: Secondary | ICD-10-CM | POA: Diagnosis not present

## 2014-07-23 DIAGNOSIS — G5790 Unspecified mononeuropathy of unspecified lower limb: Secondary | ICD-10-CM | POA: Diagnosis not present

## 2014-07-23 DIAGNOSIS — Z79899 Other long term (current) drug therapy: Secondary | ICD-10-CM | POA: Diagnosis not present

## 2014-07-23 DIAGNOSIS — Z79891 Long term (current) use of opiate analgesic: Secondary | ICD-10-CM | POA: Diagnosis not present

## 2014-07-23 DIAGNOSIS — G894 Chronic pain syndrome: Secondary | ICD-10-CM | POA: Diagnosis not present

## 2014-07-28 ENCOUNTER — Ambulatory Visit: Payer: Self-pay

## 2014-07-28 ENCOUNTER — Telehealth: Payer: Self-pay | Admitting: Internal Medicine

## 2014-07-28 DIAGNOSIS — K436 Other and unspecified ventral hernia with obstruction, without gangrene: Secondary | ICD-10-CM | POA: Diagnosis not present

## 2014-07-28 DIAGNOSIS — I5032 Chronic diastolic (congestive) heart failure: Secondary | ICD-10-CM | POA: Diagnosis not present

## 2014-07-28 DIAGNOSIS — I10 Essential (primary) hypertension: Secondary | ICD-10-CM | POA: Diagnosis not present

## 2014-07-28 DIAGNOSIS — F341 Dysthymic disorder: Secondary | ICD-10-CM | POA: Diagnosis not present

## 2014-07-28 DIAGNOSIS — Z48815 Encounter for surgical aftercare following surgery on the digestive system: Secondary | ICD-10-CM | POA: Diagnosis not present

## 2014-07-28 DIAGNOSIS — I890 Lymphedema, not elsewhere classified: Secondary | ICD-10-CM | POA: Diagnosis not present

## 2014-07-28 NOTE — Progress Notes (Signed)
Please put orders in Epic surgery 08-10-14 pre op 07-31-14 Thanks

## 2014-07-28 NOTE — Telephone Encounter (Signed)
Pt has cough and would like another rx for hydrocodone cough syrup and new rxs [facility] [phone]. Lorazepam and proventil inhaler

## 2014-07-28 NOTE — Telephone Encounter (Signed)
ok 

## 2014-07-28 NOTE — Telephone Encounter (Signed)
Okay to refill Hycodan cough syrup? 

## 2014-07-29 MED ORDER — ALBUTEROL SULFATE HFA 108 (90 BASE) MCG/ACT IN AERS: 2.0000 | INHALATION_SPRAY | Freq: Four times a day (QID) | RESPIRATORY_TRACT | Status: DC | PRN

## 2014-07-29 MED ORDER — HYDROCODONE-HOMATROPINE 5-1.5 MG/5ML PO SYRP: 5.0000 mL | ORAL_SOLUTION | Freq: Three times a day (TID) | ORAL | Status: DC | PRN

## 2014-07-29 MED ORDER — LORAZEPAM 0.5 MG PO TABS: 0.5000 mg | ORAL_TABLET | Freq: Two times a day (BID) | ORAL | Status: DC | PRN

## 2014-07-29 NOTE — Telephone Encounter (Signed)
Left detailed message Rx ready for pickup and other Rx's that were requested were sent to pharmacy.

## 2014-07-30 ENCOUNTER — Ambulatory Visit: Payer: Self-pay | Admitting: Hematology and Oncology

## 2014-07-30 ENCOUNTER — Other Ambulatory Visit: Payer: Self-pay

## 2014-07-31 ENCOUNTER — Inpatient Hospital Stay (HOSPITAL_COMMUNITY): Admission: RE | Admit: 2014-07-31 | Payer: Self-pay | Source: Ambulatory Visit

## 2014-08-03 DIAGNOSIS — R0602 Shortness of breath: Secondary | ICD-10-CM | POA: Diagnosis not present

## 2014-08-03 DIAGNOSIS — J449 Chronic obstructive pulmonary disease, unspecified: Secondary | ICD-10-CM | POA: Diagnosis not present

## 2014-08-05 ENCOUNTER — Telehealth: Payer: Self-pay | Admitting: Internal Medicine

## 2014-08-05 NOTE — Telephone Encounter (Signed)
Spoke to pt, told her we have not received any pulse ox test results. They probably went to your Pulmonary provider. Pt verbalized understanding and will check with him.

## 2014-08-05 NOTE — Telephone Encounter (Signed)
Pt called to ask if Dr Raliegh Ip has received her results from her pulse ox test

## 2014-08-10 ENCOUNTER — Inpatient Hospital Stay: Admit: 2014-08-10 | Payer: Self-pay | Admitting: Surgery

## 2014-08-10 SURGERY — GASTRECTOMY, SLEEVE, LAPAROSCOPIC, WITH HIATAL HERNIA REPAIR
Anesthesia: General

## 2014-08-15 DIAGNOSIS — I890 Lymphedema, not elsewhere classified: Secondary | ICD-10-CM | POA: Diagnosis not present

## 2014-08-15 DIAGNOSIS — F341 Dysthymic disorder: Secondary | ICD-10-CM | POA: Diagnosis not present

## 2014-08-15 DIAGNOSIS — I5032 Chronic diastolic (congestive) heart failure: Secondary | ICD-10-CM | POA: Diagnosis not present

## 2014-08-15 DIAGNOSIS — K436 Other and unspecified ventral hernia with obstruction, without gangrene: Secondary | ICD-10-CM | POA: Diagnosis not present

## 2014-08-15 DIAGNOSIS — Z48815 Encounter for surgical aftercare following surgery on the digestive system: Secondary | ICD-10-CM | POA: Diagnosis not present

## 2014-08-15 DIAGNOSIS — I10 Essential (primary) hypertension: Secondary | ICD-10-CM | POA: Diagnosis not present

## 2014-08-17 ENCOUNTER — Telehealth: Payer: Self-pay | Admitting: Pulmonary Disease

## 2014-08-17 ENCOUNTER — Telehealth: Payer: Self-pay | Admitting: Internal Medicine

## 2014-08-17 DIAGNOSIS — G4734 Idiopathic sleep related nonobstructive alveolar hypoventilation: Secondary | ICD-10-CM

## 2014-08-17 NOTE — Telephone Encounter (Signed)
Alexandria [name], let pt know that we received a note from her primary md about her ONO.  She does drop her level, and would benefit from oxygen given the duration of the drop.  Find out if she would wear oxygen with sleep? Find out if she ever had her surgery on her hiatal hernia??

## 2014-08-17 NOTE — Telephone Encounter (Signed)
Pt had oxygen test from [facility] and would like results

## 2014-08-17 NOTE — Telephone Encounter (Signed)
Please obtain results

## 2014-08-18 ENCOUNTER — Encounter: Payer: Self-pay | Admitting: Internal Medicine

## 2014-08-18 ENCOUNTER — Other Ambulatory Visit: Payer: Self-pay | Admitting: Pulmonary Disease

## 2014-08-18 ENCOUNTER — Ambulatory Visit (INDEPENDENT_AMBULATORY_CARE_PROVIDER_SITE_OTHER): Payer: Medicare Other | Admitting: Internal Medicine

## 2014-08-18 VITALS — BP 130/80 | HR 104 | Temp 97.7°F | Resp 20 | Ht 68.0 in | Wt 258.0 lb

## 2014-08-18 DIAGNOSIS — Z923 Personal history of irradiation: Secondary | ICD-10-CM

## 2014-08-18 DIAGNOSIS — I2510 Atherosclerotic heart disease of native coronary artery without angina pectoris: Secondary | ICD-10-CM

## 2014-08-18 DIAGNOSIS — I2584 Coronary atherosclerosis due to calcified coronary lesion: Secondary | ICD-10-CM | POA: Diagnosis not present

## 2014-08-18 DIAGNOSIS — G8929 Other chronic pain: Secondary | ICD-10-CM | POA: Diagnosis not present

## 2014-08-18 DIAGNOSIS — K430 Incisional hernia with obstruction, without gangrene: Secondary | ICD-10-CM

## 2014-08-18 DIAGNOSIS — G4734 Idiopathic sleep related nonobstructive alveolar hypoventilation: Secondary | ICD-10-CM | POA: Insufficient documentation

## 2014-08-18 DIAGNOSIS — M549 Dorsalgia, unspecified: Secondary | ICD-10-CM | POA: Diagnosis not present

## 2014-08-18 NOTE — Progress Notes (Signed)
Pre visit review using our clinic review tool, if applicable. No additional management support is needed unless otherwise documented below in the visit note. 

## 2014-08-18 NOTE — Patient Instructions (Signed)
Limit your sodium (Salt) intake  You need to lose weight.  Consider a lower calorie diet and regular exercise.  Return in 6 months for follow-up   

## 2014-08-18 NOTE — Telephone Encounter (Signed)
Called pt and LMTCB x1 

## 2014-08-18 NOTE — Telephone Encounter (Signed)
I will send an order to start oxygen at bedtime with sleep only.  Dr. Hassell Done will have to make the decision about her surgery, since he is the expert and understands her risks better.

## 2014-08-18 NOTE — Telephone Encounter (Signed)
[phone] calling back

## 2014-08-18 NOTE — Progress Notes (Signed)
Subjective:    Patient ID: Alexandria [name], female    DOB: 1951-08-24, 63 y.o.   MRN: [id number]  HPI  63 year old patient who has a history of morbid obesity and a large hiatal hernia.  She has had recent surgery for an incisional ventral hernia with obstruction. She has had a recent ONO performed with some significant desaturations and will be placed on nocturnal nasal cannula oxygen therapy.  She is followed by pulmonary medicine She is also followed by general surgery and is quite anxious to have a Nissen fundoplication performed as well as bariatric surgery.  She is scheduled for follow-up later this week  Past Medical History  Diagnosis Date  . Iron deficiency anemia   . Status post chemotherapy     4 cycles of Taxotere and cytoxan  . Chronic back pain   . Hx of pulmonary embolus     During c-Section  . Neuropathy   . Obesity     Class 2  . S/P radiation therapy  03/04/2013-04/17/2013    1) Right breast / 50 Gy in 25 fractions/ 2) Right breast boost / 10 Gy in 5 fractions  . Diastolic heart failure   . Hx of radiation therapy 03/04/13- 04/17/13    right breast 50 Gy 25 fractions, right breast boost 10 Gy 5 fractions  . Dyspnea on exertion   . CHF (congestive heart failure)   . Anxiety   . H/O hiatal hernia   . 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 GAD (generalized anxiety disorder)   . 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 Louretta Shorten    History   Social History  . Marital Status: Married    Spouse Name: N/A    Number of Children: N/A  . Years of Education: N/A   Occupational History  . retired Therapist, sports    Social History Main Topics  . Smoking status: Never Smoker   . Smokeless tobacco: Never Used  . Alcohol Use: No  . Drug Use: No  . Sexual Activity: Yes   Other Topics Concern  . Not on file   Social History Narrative    Past Surgical History   Procedure Laterality Date  . Cesarean section    . Right breast lumpectomy    . Partial colectomy N/A 07/04/2014    Procedure: REPAIR OF INCARCERATED INCISIONAL HERNIA WITH MESH;  Surgeon: Excell Seltzer, MD;  Location: [facility];  Service: General;  Laterality: N/A;    Family History  Problem Relation Age of Onset  . Parkinson's disease Mother   . Emphysema Maternal Grandfather   . COPD Sister     Allergies  Allergen Reactions  . Contrast Media [Iodinated Diagnostic Agents] Anaphylaxis  . Albuterol Other (See Comments)    Anxious   . Prednisone Other (See Comments)    Pt gets very agitated when she takes high doses of steroids    Current Outpatient Prescriptions on File Prior to Visit  Medication Sig Dispense Refill  . albuterol (PROVENTIL HFA;VENTOLIN HFA) 108 (90 BASE) MCG/ACT inhaler Inhale 2 puffs into the lungs every 6 (six) hours as needed for wheezing or shortness of breath (wheezing). 1 Inhaler 5  . anastrozole (ARIMIDEX) 1 MG tablet Take 1 mg by mouth daily.    Marland Kitchen atorvastatin (LIPITOR) 20 MG tablet Take 1 tablet (20 mg total) by mouth daily.  90 tablet 3  . budesonide (PULMICORT) 180 MCG/ACT inhaler Inhale 2 puffs into the lungs daily as needed (shortness of breath).    . docusate sodium 100 MG CAPS Take 100 mg by mouth 2 (two) times daily. 10 capsule 0  . escitalopram (LEXAPRO) 20 MG tablet Take 1 tablet (20 mg total) by mouth daily. 90 tablet 3  . ferrous sulfate 325 (65 FE) MG tablet Take 325 mg by mouth daily with breakfast.    . furosemide (LASIX) 80 MG tablet Take 80 mg by mouth 3 (three) times daily.    Marland Kitchen HYDROcodone-homatropine (HYCODAN) 5-1.5 MG/5ML syrup Take 5 mLs by mouth every 8 (eight) hours as needed for cough. 120 mL 0  . HYDROmorphone (DILAUDID) 4 MG tablet Take 1 tablet (4 mg total) by mouth every 6 (six) hours as needed for severe pain (pain). 40 tablet 0  . levalbuterol (XOPENEX) 0.63 MG/3ML nebulizer solution Take 0.63 mg by nebulization every 4  (four) hours as needed for wheezing or shortness of breath (shortness of breath).    . lidocaine (LIDODERM) 5 % Place 1-2 patches onto the skin daily. Remove & Discard patch within 12 hours or as directed by MD    . lidocaine-prilocaine (EMLA) cream Apply 1 application topically as needed (port access).     . LORazepam (ATIVAN) 0.5 MG tablet Take 1 tablet (0.5 mg total) by mouth 2 (two) times daily as needed for anxiety (anxiety). 30 tablet 2  . lubiprostone (AMITIZA) 24 MCG capsule Take 1 capsule (24 mcg total) by mouth 2 (two) times daily with a meal. 60 capsule 0  . Multiple Vitamins-Minerals (HAIR/SKIN/NAILS PO) Take 1 tablet by mouth every morning.    . naproxen (NAPROSYN) 500 MG tablet Take 1 tablet (500 mg total) by mouth 2 (two) times daily as needed for headache (if response to Tylenol insufficient). 30 tablet 0  . potassium chloride (KLOR-CON) 20 MEQ packet Take 40 mEq by mouth 3 (three) times daily.    . QUEtiapine (SEROQUEL) 25 MG tablet Take 1 tablet (25 mg total) by mouth 2 (two) times daily. 30 tablet 0  . methocarbamol (ROBAXIN) 500 MG tablet Take 2 tablets (1,000 mg total) by mouth every 6 (six) hours as needed for muscle spasms. (Patient not taking: Reported on 08/18/2014) 40 tablet 0   No current facility-administered medications on file prior to visit.    BP 130/80 mmHg  Pulse 104  Temp(Src) 97.7 F (36.5 C) (Oral)  Resp 20  Ht 5' 8"  (1.727 m)  Wt 258 lb (117.028 kg)  BMI 39.24 kg/m2  SpO2 93%  LMP 01/30/2005     Review of Systems  Constitutional: Positive for fatigue.  HENT: Negative for congestion, dental problem, hearing loss, rhinorrhea, sinus pressure, sore throat and tinnitus.   Eyes: Negative for pain, discharge and visual disturbance.  Respiratory: Positive for shortness of breath. Negative for cough.   Cardiovascular: Positive for leg swelling. Negative for chest pain and palpitations.  Gastrointestinal: Negative for nausea, vomiting, abdominal pain,  diarrhea, constipation, blood in stool and abdominal distention.  Genitourinary: Negative for dysuria, urgency, frequency, hematuria, flank pain, vaginal bleeding, vaginal discharge, difficulty urinating, vaginal pain and pelvic pain.  Musculoskeletal: Negative for joint swelling, arthralgias and gait problem.  Skin: Negative for rash.  Neurological: Negative for dizziness, syncope, speech difficulty, weakness, numbness and headaches.  Hematological: Negative for adenopathy.  Psychiatric/Behavioral: Negative for behavioral problems, dysphoric mood and agitation. The patient is not nervous/anxious.  Objective:   Physical Exam  Constitutional: She is oriented to person, place, and time. She appears well-developed and well-nourished.  HENT:  Head: Normocephalic.  Right Ear: External ear normal.  Left Ear: External ear normal.  Mouth/Throat: Oropharynx is clear and moist.  Eyes: Conjunctivae and EOM are normal. Pupils are equal, round, and reactive to light.  Neck: Normal range of motion. Neck supple. No thyromegaly present.  Cardiovascular: Normal rate, regular rhythm, normal heart sounds and intact distal pulses.   Pulmonary/Chest: Effort normal.  Few scattered rhonchi  O2 sat ration 93%  Abdominal: Soft. Bowel sounds are normal. She exhibits no mass. There is no tenderness.  Musculoskeletal: Normal range of motion. She exhibits edema.  Prominent lower extremity edema, left greater than right  Lymphadenopathy:    She has no cervical adenopathy.  Neurological: She is alert and oriented to person, place, and time.  Skin: Skin is warm and dry. No rash noted.  Psychiatric: She has a normal mood and affect. Her behavior is normal.          Assessment & Plan:   Hypertension, controlled Obesity Nocturnal hypoxemia History of mediastinal lymphadenopathy.  Stable on recent chest CT Chronic lower extremity edema  Follow general surgery and pulmonary medicine No change in  medication

## 2014-08-18 NOTE — Telephone Encounter (Signed)
lmomtcb x1 for pt 

## 2014-08-18 NOTE — Telephone Encounter (Signed)
Received results, pt here for appt today given to Dr. Raliegh Ip.

## 2014-08-18 NOTE — Telephone Encounter (Signed)
Spoke with pt. She reports she would wear the O2 during the sleep. She thinks if she had the hiatal surgery done then this would solve her problem. She had to have emergency surgery in October (in epic) for repair of incarcerated incisional hernia w/ mesh. Now the doctor does not want to do the surgery d/t this. She thinks if we try to get in touch w/ Dr. Hassell Done this may help. Please advise [place] thanks

## 2014-08-19 ENCOUNTER — Telehealth: Payer: Self-pay | Admitting: Family Medicine

## 2014-08-19 NOTE — Telephone Encounter (Signed)
Pt returning call.Alexandria [name] ° °

## 2014-08-19 NOTE — Telephone Encounter (Signed)
Called made pt aware of below. She voiced understanding and needed nothing further.

## 2014-08-19 NOTE — Telephone Encounter (Signed)
Spoke with Donaciano Eva with [facility].  They are unable to use the data from recent oxygen qualification testing.  [facility] is not contracted with that company.  They will have to do their own testing at home and will need an order.  [facility] is contracted with company that done testing.  Please decide to proceed with [facility] and do additional testing or send a new order to [place].  Thanks!

## 2014-08-20 ENCOUNTER — Telehealth: Payer: Self-pay | Admitting: Pulmonary Disease

## 2014-08-20 DIAGNOSIS — M5137 Other intervertebral disc degeneration, lumbosacral region: Secondary | ICD-10-CM | POA: Diagnosis not present

## 2014-08-20 DIAGNOSIS — G894 Chronic pain syndrome: Secondary | ICD-10-CM | POA: Diagnosis not present

## 2014-08-20 DIAGNOSIS — Z79899 Other long term (current) drug therapy: Secondary | ICD-10-CM | POA: Diagnosis not present

## 2014-08-20 DIAGNOSIS — G620 Drug-induced polyneuropathy: Secondary | ICD-10-CM | POA: Diagnosis not present

## 2014-08-20 DIAGNOSIS — G5790 Unspecified mononeuropathy of unspecified lower limb: Secondary | ICD-10-CM | POA: Diagnosis not present

## 2014-08-20 DIAGNOSIS — M47817 Spondylosis without myelopathy or radiculopathy, lumbosacral region: Secondary | ICD-10-CM | POA: Diagnosis not present

## 2014-08-20 DIAGNOSIS — Z79891 Long term (current) use of opiate analgesic: Secondary | ICD-10-CM | POA: Diagnosis not present

## 2014-08-20 DIAGNOSIS — M5408 Panniculitis affecting regions of neck and back, sacral and sacrococcygeal region: Secondary | ICD-10-CM | POA: Diagnosis not present

## 2014-08-20 DIAGNOSIS — R109 Unspecified abdominal pain: Secondary | ICD-10-CM | POA: Diagnosis not present

## 2014-08-20 NOTE — Telephone Encounter (Signed)
Called and spoke with pt and she stated that concentrator that she has now is very loud and she only uses this at night.  She stated that she spoke with [facility] and they told her that they have a POC that she can use that is very quite and its the simply go.  Pt is requesting that we send an order to [facility] for this.  Kincaid please advise. Thanks  Allergies  Allergen Reactions  . Contrast Media [Iodinated Diagnostic Agents] Anaphylaxis  . Albuterol Other (See Comments)    Anxious   . Prednisone Other (See Comments)    Pt gets very agitated when she takes high doses of steroids    Current Outpatient Prescriptions on File Prior to Visit  Medication Sig Dispense Refill  . albuterol (PROVENTIL HFA;VENTOLIN HFA) 108 (90 BASE) MCG/ACT inhaler Inhale 2 puffs into the lungs every 6 (six) hours as needed for wheezing or shortness of breath (wheezing). 1 Inhaler 5  . anastrozole (ARIMIDEX) 1 MG tablet Take 1 mg by mouth daily.    Marland Kitchen atorvastatin (LIPITOR) 20 MG tablet Take 1 tablet (20 mg total) by mouth daily. 90 tablet 3  . budesonide (PULMICORT) 180 MCG/ACT inhaler Inhale 2 puffs into the lungs daily as needed (shortness of breath).    . docusate sodium 100 MG CAPS Take 100 mg by mouth 2 (two) times daily. 10 capsule 0  . escitalopram (LEXAPRO) 20 MG tablet Take 1 tablet (20 mg total) by mouth daily. 90 tablet 3  . ferrous sulfate 325 (65 FE) MG tablet Take 325 mg by mouth daily with breakfast.    . furosemide (LASIX) 80 MG tablet Take 80 mg by mouth 3 (three) times daily.    Marland Kitchen HYDROcodone-homatropine (HYCODAN) 5-1.5 MG/5ML syrup Take 5 mLs by mouth every 8 (eight) hours as needed for cough. 120 mL 0  . HYDROmorphone (DILAUDID) 4 MG tablet Take 1 tablet (4 mg total) by mouth every 6 (six) hours as needed for severe pain (pain). 40 tablet 0  . HYDROmorphone HCl 16 MG T24A Take 2 tablets by mouth daily.   0  . levalbuterol (XOPENEX) 0.63 MG/3ML nebulizer solution Take 0.63 mg by nebulization every 4 (four) hours  as needed for wheezing or shortness of breath (shortness of breath).    . lidocaine (LIDODERM) 5 % Place 1-2 patches onto the skin daily. Remove & Discard patch within 12 hours or as directed by MD    . lidocaine-prilocaine (EMLA) cream Apply 1 application topically as needed (port access).     . LORazepam (ATIVAN) 0.5 MG tablet Take 1 tablet (0.5 mg total) by mouth 2 (two) times daily as needed for anxiety (anxiety). 30 tablet 2  . lubiprostone (AMITIZA) 24 MCG capsule Take 1 capsule (24 mcg total) by mouth 2 (two) times daily with a meal. 60 capsule 0  . methocarbamol (ROBAXIN) 500 MG tablet Take 2 tablets (1,000 mg total) by mouth every 6 (six) hours as needed for muscle spasms. (Patient not taking: Reported on 08/18/2014) 40 tablet 0  . Multiple Vitamins-Minerals (HAIR/SKIN/NAILS PO) Take 1 tablet by mouth every morning.    . naproxen (NAPROSYN) 500 MG tablet Take 1 tablet (500 mg total) by mouth 2 (two) times daily as needed for headache (if response to Tylenol insufficient). 30 tablet 0  . potassium chloride (KLOR-CON) 20 MEQ packet Take 40 mEq by mouth 3 (three) times daily.    . QUEtiapine (SEROQUEL) 25 MG tablet Take 1 tablet (25 mg total) by  mouth 2 (two) times daily. 30 tablet 0   No current facility-administered medications on file prior to visit.

## 2014-08-21 ENCOUNTER — Ambulatory Visit: Admission: RE | Admit: 2014-08-21 | Payer: Medicare Other | Source: Ambulatory Visit | Admitting: Radiation Oncology

## 2014-08-21 NOTE — Telephone Encounter (Signed)
lmomtcb x1 

## 2014-08-21 NOTE — Telephone Encounter (Signed)
Cannot use a POC for nocturnal oxygen unless it has continuous mode.  Check with DME and make sure this is the case. Let pt know about this as well.

## 2014-08-21 NOTE — Telephone Encounter (Signed)
[facility] and spoke with Jiles Crocker.  He states pt was sent nocturnal oxygen on 12.3.2015 and it was delivered to patient.   Dr. Gwenette Greet had it ordered.

## 2014-08-24 NOTE — Telephone Encounter (Addendum)
Called and spoke to Nate with [facility]. Nate stated the simply go concentrator dose supply 2lpm continuous. Nate stated insurance will not cover POC being used as nocturnal use but [facility] will file it differently allowing insurance to cover the POC for nocturnal use. The order will need to say, "evaluate for smaller concentrator- simplygo- for nocturnal use".  Douglas please advise if ok to place order.

## 2014-08-24 NOTE — Telephone Encounter (Signed)
i will discuss this in the [facility] mtg 08/25/14 Joellen Jersey

## 2014-08-24 NOTE — Telephone Encounter (Signed)
I am not going to do anything illegal and not above board.  Either insurance covers it or not.  Please send this to libby to help [place].  She deals with [facility] a lot.

## 2014-08-25 NOTE — Telephone Encounter (Signed)
Libby, any updates? Thanks.

## 2014-08-25 NOTE — Telephone Encounter (Signed)
Spoke with [email or web address]  she is working on the answers for this issue Joellen Jersey

## 2014-08-25 NOTE — Telephone Encounter (Signed)
[phone] calling back she said to leave it on her voicemail

## 2014-08-26 ENCOUNTER — Telehealth: Payer: Self-pay | Admitting: *Deleted

## 2014-08-26 ENCOUNTER — Telehealth: Payer: Self-pay | Admitting: Pulmonary Disease

## 2014-08-26 NOTE — Telephone Encounter (Signed)
Pt aware we have spoken to [facility] people and this issue is being resolved she will not be able to get a poc because ins will not pay so pt will work with [facility] on what she can use@night  Alexandria [name]

## 2014-08-26 NOTE — Telephone Encounter (Signed)
Called and spoke to pt. Pt states her SOB has recently worsened. Pt c/o increase in SOB and prod cough with little mucus production- yellow and green in color x 2 weeks. Pt stated her hiatal hernia surgery was postponed and does not have a set surgery date. Pt stated she called Dr. Earlie Server office together more info but was informed he will not be in the office till next week. Pt requesting recs by Jal. Pt last seen by [facility] on 01/29/14.  [place] please advise.  Allergies  Allergen Reactions  . Contrast Media [Iodinated Diagnostic Agents] Anaphylaxis  . Albuterol Other (See Comments)    Anxious   . Prednisone Other (See Comments)    Pt gets very agitated when she takes high doses of steroids

## 2014-08-26 NOTE — Telephone Encounter (Signed)
LMTCB

## 2014-08-26 NOTE — Telephone Encounter (Signed)
Ok to call in doxycycline 100mg  bid for 5 days for possible bronchitis Will need ov if she does not improve.

## 2014-08-26 NOTE — Telephone Encounter (Signed)
Pt missed lab/MD in November.  She is calling to reschedule.

## 2014-08-27 ENCOUNTER — Other Ambulatory Visit: Payer: Self-pay | Admitting: Internal Medicine

## 2014-08-27 MED ORDER — DOXYCYCLINE HYCLATE 100 MG PO TABS: 100.0000 mg | ORAL_TABLET | Freq: Two times a day (BID) | ORAL | Status: DC

## 2014-08-27 NOTE — Telephone Encounter (Signed)
LMTCB x2  

## 2014-08-27 NOTE — Telephone Encounter (Signed)
Spoke with pt and advised of Dr Janifer Adie recommendations.  Rx sent to pharmacy.  Pt states that she is only on oxygen at night but is having sats drop to 81% when dressing and with certain activities.  OV scheduled with Dr Gwenette Greet.

## 2014-08-27 NOTE — Telephone Encounter (Signed)
Please reschedule to next year, 30 minutes appointment

## 2014-08-27 NOTE — Telephone Encounter (Signed)
Pt returning call.Alexandria [name] ° °

## 2014-08-28 ENCOUNTER — Encounter: Payer: Self-pay | Admitting: Internal Medicine

## 2014-08-28 ENCOUNTER — Ambulatory Visit: Payer: Self-pay | Admitting: Pulmonary Disease

## 2014-08-29 ENCOUNTER — Other Ambulatory Visit: Payer: Self-pay | Admitting: *Deleted

## 2014-08-30 ENCOUNTER — Telehealth: Payer: Self-pay | Admitting: Hematology and Oncology

## 2014-08-30 NOTE — Telephone Encounter (Signed)
lvm for pt regarding Jan appt....mailed pt appt sched and letter

## 2014-08-31 ENCOUNTER — Telehealth: Payer: Self-pay | Admitting: Internal Medicine

## 2014-08-31 DIAGNOSIS — M7752 Other enthesopathy of left foot: Secondary | ICD-10-CM | POA: Diagnosis not present

## 2014-08-31 DIAGNOSIS — M7732 Calcaneal spur, left foot: Secondary | ICD-10-CM | POA: Diagnosis not present

## 2014-08-31 DIAGNOSIS — M7731 Calcaneal spur, right foot: Secondary | ICD-10-CM | POA: Diagnosis not present

## 2014-08-31 DIAGNOSIS — M7751 Other enthesopathy of right foot: Secondary | ICD-10-CM | POA: Diagnosis not present

## 2014-08-31 DIAGNOSIS — M722 Plantar fascial fibromatosis: Secondary | ICD-10-CM | POA: Diagnosis not present

## 2014-08-31 NOTE — Telephone Encounter (Signed)
Ellise called back, told her pt's oxygen was taken care of by Dr. Gwenette Greet and pt has oxygen can disregard order from Dr. Burnice Logan. Ellise verbalized understanding.

## 2014-08-31 NOTE — Telephone Encounter (Signed)
Ellise from [place] would like to know the status of patient receiving her oxygen equipment.

## 2014-08-31 NOTE — Telephone Encounter (Signed)
Called [facility] and left message for Ellise that pt is already getting oxygen disregard order for pt.

## 2014-09-04 ENCOUNTER — Ambulatory Visit: Payer: Medicare Other | Admitting: Radiation Oncology

## 2014-09-07 ENCOUNTER — Ambulatory Visit (INDEPENDENT_AMBULATORY_CARE_PROVIDER_SITE_OTHER): Payer: Managed Care, Other (non HMO) | Admitting: Adult Health

## 2014-09-07 ENCOUNTER — Ambulatory Visit: Payer: Self-pay | Admitting: Pulmonary Disease

## 2014-09-07 ENCOUNTER — Encounter: Payer: Self-pay | Admitting: Adult Health

## 2014-09-07 ENCOUNTER — Ambulatory Visit (INDEPENDENT_AMBULATORY_CARE_PROVIDER_SITE_OTHER)
Admission: RE | Admit: 2014-09-07 | Discharge: 2014-09-07 | Disposition: A | Discharge status: Home / Self Care | Payer: Medicare Other | Source: Ambulatory Visit | Attending: Adult Health | Admitting: Adult Health

## 2014-09-07 ENCOUNTER — Telehealth: Payer: Self-pay | Admitting: Internal Medicine

## 2014-09-07 ENCOUNTER — Inpatient Hospital Stay (HOSPITAL_COMMUNITY)
Admission: AD | Admit: 2014-09-07 | Discharge: 2014-09-10 | DRG: 291 | Disposition: A | Discharge status: Home / Self Care | Payer: Medicare Other | Source: Ambulatory Visit | Attending: Pulmonary Disease | Admitting: Pulmonary Disease

## 2014-09-07 ENCOUNTER — Encounter (HOSPITAL_COMMUNITY): Payer: Self-pay | Admitting: Physician Assistant

## 2014-09-07 VITALS — BP 118/72 | HR 78 | Temp 97.4°F

## 2014-09-07 DIAGNOSIS — C50911 Malignant neoplasm of unspecified site of right female breast: Secondary | ICD-10-CM | POA: Diagnosis present

## 2014-09-07 DIAGNOSIS — I5033 Acute on chronic diastolic (congestive) heart failure: Secondary | ICD-10-CM | POA: Diagnosis not present

## 2014-09-07 DIAGNOSIS — F329 Major depressive disorder, single episode, unspecified: Secondary | ICD-10-CM | POA: Diagnosis present

## 2014-09-07 DIAGNOSIS — J9601 Acute respiratory failure with hypoxia: Secondary | ICD-10-CM | POA: Diagnosis present

## 2014-09-07 DIAGNOSIS — Z9011 Acquired absence of right breast and nipple: Secondary | ICD-10-CM | POA: Diagnosis present

## 2014-09-07 DIAGNOSIS — Z86711 Personal history of pulmonary embolism: Secondary | ICD-10-CM

## 2014-09-07 DIAGNOSIS — R0609 Other forms of dyspnea: Secondary | ICD-10-CM

## 2014-09-07 DIAGNOSIS — I890 Lymphedema, not elsewhere classified: Secondary | ICD-10-CM

## 2014-09-07 DIAGNOSIS — J9621 Acute and chronic respiratory failure with hypoxia: Secondary | ICD-10-CM

## 2014-09-07 DIAGNOSIS — N179 Acute kidney failure, unspecified: Secondary | ICD-10-CM | POA: Diagnosis present

## 2014-09-07 DIAGNOSIS — J9600 Acute respiratory failure, unspecified whether with hypoxia or hypercapnia: Secondary | ICD-10-CM | POA: Diagnosis not present

## 2014-09-07 DIAGNOSIS — J209 Acute bronchitis, unspecified: Secondary | ICD-10-CM

## 2014-09-07 DIAGNOSIS — J9611 Chronic respiratory failure with hypoxia: Secondary | ICD-10-CM | POA: Diagnosis not present

## 2014-09-07 DIAGNOSIS — Z923 Personal history of irradiation: Secondary | ICD-10-CM | POA: Diagnosis not present

## 2014-09-07 DIAGNOSIS — Z79891 Long term (current) use of opiate analgesic: Secondary | ICD-10-CM | POA: Diagnosis not present

## 2014-09-07 DIAGNOSIS — J984 Other disorders of lung: Secondary | ICD-10-CM

## 2014-09-07 DIAGNOSIS — Z888 Allergy status to other drugs, medicaments and biological substances status: Secondary | ICD-10-CM

## 2014-09-07 DIAGNOSIS — Z91041 Radiographic dye allergy status: Secondary | ICD-10-CM

## 2014-09-07 DIAGNOSIS — E668 Other obesity: Secondary | ICD-10-CM

## 2014-09-07 DIAGNOSIS — Z853 Personal history of malignant neoplasm of breast: Secondary | ICD-10-CM

## 2014-09-07 DIAGNOSIS — I519 Heart disease, unspecified: Secondary | ICD-10-CM | POA: Diagnosis not present

## 2014-09-07 DIAGNOSIS — I10 Essential (primary) hypertension: Secondary | ICD-10-CM | POA: Diagnosis present

## 2014-09-07 DIAGNOSIS — E662 Morbid (severe) obesity with alveolar hypoventilation: Secondary | ICD-10-CM | POA: Diagnosis present

## 2014-09-07 DIAGNOSIS — R05 Cough: Secondary | ICD-10-CM | POA: Insufficient documentation

## 2014-09-07 DIAGNOSIS — G8929 Other chronic pain: Secondary | ICD-10-CM | POA: Diagnosis present

## 2014-09-07 DIAGNOSIS — R0602 Shortness of breath: Secondary | ICD-10-CM | POA: Diagnosis not present

## 2014-09-07 DIAGNOSIS — Z6839 Body mass index (BMI) 39.0-39.9, adult: Secondary | ICD-10-CM | POA: Diagnosis not present

## 2014-09-07 DIAGNOSIS — R030 Elevated blood-pressure reading, without diagnosis of hypertension: Secondary | ICD-10-CM | POA: Diagnosis not present

## 2014-09-07 DIAGNOSIS — E669 Obesity, unspecified: Secondary | ICD-10-CM | POA: Diagnosis not present

## 2014-09-07 DIAGNOSIS — R062 Wheezing: Secondary | ICD-10-CM | POA: Diagnosis not present

## 2014-09-07 DIAGNOSIS — I5031 Acute diastolic (congestive) heart failure: Secondary | ICD-10-CM

## 2014-09-07 DIAGNOSIS — F411 Generalized anxiety disorder: Secondary | ICD-10-CM | POA: Diagnosis present

## 2014-09-07 DIAGNOSIS — R0902 Hypoxemia: Secondary | ICD-10-CM | POA: Insufficient documentation

## 2014-09-07 DIAGNOSIS — J069 Acute upper respiratory infection, unspecified: Secondary | ICD-10-CM | POA: Diagnosis present

## 2014-09-07 DIAGNOSIS — M25569 Pain in unspecified knee: Secondary | ICD-10-CM

## 2014-09-07 DIAGNOSIS — K449 Diaphragmatic hernia without obstruction or gangrene: Secondary | ICD-10-CM | POA: Diagnosis present

## 2014-09-07 DIAGNOSIS — R059 Cough, unspecified: Secondary | ICD-10-CM | POA: Insufficient documentation

## 2014-09-07 DIAGNOSIS — Z9981 Dependence on supplemental oxygen: Secondary | ICD-10-CM

## 2014-09-07 DIAGNOSIS — M25561 Pain in right knee: Secondary | ICD-10-CM | POA: Diagnosis not present

## 2014-09-07 DIAGNOSIS — IMO0001 Reserved for inherently not codable concepts without codable children: Secondary | ICD-10-CM

## 2014-09-07 DIAGNOSIS — R0600 Dyspnea, unspecified: Secondary | ICD-10-CM | POA: Diagnosis not present

## 2014-09-07 HISTORY — DX: Lymphedema, not elsewhere classified: I89.0

## 2014-09-07 HISTORY — DX: Other ill-defined heart diseases: I51.89

## 2014-09-07 HISTORY — DX: Headache, unspecified: R51.9

## 2014-09-07 HISTORY — DX: Adverse effect of unspecified anesthetic, initial encounter: T41.45XA

## 2014-09-07 HISTORY — DX: Other intervertebral disc degeneration, thoracolumbar region: M51.35

## 2014-09-07 HISTORY — DX: Headache: R51

## 2014-09-07 HISTORY — DX: Other intervertebral disc degeneration, lumbosacral region without mention of lumbar back pain or lower extremity pain: M51.379

## 2014-09-07 HISTORY — DX: Other intervertebral disc degeneration, lumbosacral region: M51.37

## 2014-09-07 HISTORY — DX: Pneumonia, unspecified organism: J18.9

## 2014-09-07 HISTORY — DX: Personal history of other medical treatment: Z92.89

## 2014-09-07 HISTORY — DX: Other complications of anesthesia, initial encounter: T88.59XA

## 2014-09-07 HISTORY — DX: Fibromyalgia: M79.7

## 2014-09-07 HISTORY — DX: Neuralgia and neuritis, unspecified: M79.2

## 2014-09-07 HISTORY — DX: Essential (primary) hypertension: I10

## 2014-09-07 HISTORY — DX: Unspecified chronic bronchitis: J42

## 2014-09-07 HISTORY — DX: Other cervical disc degeneration, unspecified cervical region: M50.30

## 2014-09-07 LAB — CBC WITH DIFFERENTIAL/PLATELET
Basophils Absolute: 0 10*3/uL (ref 0.0–0.1)
Basophils Relative: 0 % (ref 0–1)
EOS ABS: 0.2 10*3/uL (ref 0.0–0.7)
EOS PCT: 2 % (ref 0–5)
HCT: 31.5 % — ABNORMAL LOW (ref 36.0–46.0)
Hemoglobin: 9.7 g/dL — ABNORMAL LOW (ref 12.0–15.0)
LYMPHS ABS: 1.2 10*3/uL (ref 0.7–4.0)
Lymphocytes Relative: 18 % (ref 12–46)
MCH: 23.7 pg — ABNORMAL LOW (ref 26.0–34.0)
MCHC: 30.8 g/dL (ref 30.0–36.0)
MCV: 76.8 fL — AB (ref 78.0–100.0)
Monocytes Absolute: 0.5 10*3/uL (ref 0.1–1.0)
Monocytes Relative: 7 % (ref 3–12)
Neutro Abs: 5 10*3/uL (ref 1.7–7.7)
Neutrophils Relative %: 73 % (ref 43–77)
PLATELETS: 256 10*3/uL (ref 150–400)
RBC: 4.1 MIL/uL (ref 3.87–5.11)
RDW: 17.5 % — ABNORMAL HIGH (ref 11.5–15.5)
WBC: 6.8 10*3/uL (ref 4.0–10.5)

## 2014-09-07 LAB — TSH: TSH: 1.9 u[IU]/mL (ref 0.350–4.500)

## 2014-09-07 LAB — PRO B NATRIURETIC PEPTIDE: PRO B NATRI PEPTIDE: 304.7 pg/mL — AB (ref 0–125)

## 2014-09-07 MED ORDER — POTASSIUM CHLORIDE CRYS ER 20 MEQ PO TBCR
40.0000 meq | EXTENDED_RELEASE_TABLET | Freq: Two times a day (BID) | ORAL | Status: DC
  Administered 2014-09-07 – 2014-09-10 (×6): 40 meq via ORAL
  Filled 2014-09-07 (×10): qty 2

## 2014-09-07 MED ORDER — FUROSEMIDE 10 MG/ML IJ SOLN
40.0000 mg | Freq: Two times a day (BID) | INTRAMUSCULAR | Status: DC
  Administered 2014-09-08 – 2014-09-09 (×4): 40 mg via INTRAVENOUS
  Filled 2014-09-07 (×7): qty 4

## 2014-09-07 MED ORDER — HYDROMORPHONE HCL ER 16 MG PO T24A
1.0000 | EXTENDED_RELEASE_TABLET | ORAL | Status: DC
Start: 2014-09-07 — End: 2014-09-07

## 2014-09-07 MED ORDER — LUBIPROSTONE 24 MCG PO CAPS
24.0000 ug | ORAL_CAPSULE | Freq: Two times a day (BID) | ORAL | Status: DC
  Administered 2014-09-07 – 2014-09-10 (×6): 24 ug via ORAL
  Filled 2014-09-07 (×9): qty 1

## 2014-09-07 MED ORDER — LEVALBUTEROL HCL 0.63 MG/3ML IN NEBU: 0.6300 mg | INHALATION_SOLUTION | RESPIRATORY_TRACT | Status: DC | PRN

## 2014-09-07 MED ORDER — ATORVASTATIN CALCIUM 20 MG PO TABS
20.0000 mg | ORAL_TABLET | Freq: Every day | ORAL | Status: DC
  Administered 2014-09-07 – 2014-09-09 (×3): 20 mg via ORAL
  Filled 2014-09-07 (×4): qty 1

## 2014-09-07 MED ORDER — DOCUSATE SODIUM 100 MG PO CAPS
100.0000 mg | ORAL_CAPSULE | Freq: Two times a day (BID) | ORAL | Status: DC
  Administered 2014-09-07 – 2014-09-09 (×5): 100 mg via ORAL
  Filled 2014-09-07 (×9): qty 1

## 2014-09-07 MED ORDER — SODIUM CHLORIDE 0.9 % IJ SOLN
3.0000 mL | Freq: Two times a day (BID) | INTRAMUSCULAR | Status: DC
  Administered 2014-09-08 – 2014-09-09 (×2): 3 mL via INTRAVENOUS

## 2014-09-07 MED ORDER — SENNOSIDES-DOCUSATE SODIUM 8.6-50 MG PO TABS: 1.0000 | ORAL_TABLET | Freq: Every evening | ORAL | Status: DC | PRN

## 2014-09-07 MED ORDER — LORAZEPAM 0.5 MG PO TABS
0.5000 mg | ORAL_TABLET | Freq: Two times a day (BID) | ORAL | Status: DC | PRN
  Administered 2014-09-07 – 2014-09-09 (×3): 0.5 mg via ORAL
  Filled 2014-09-07 (×4): qty 1

## 2014-09-07 MED ORDER — SODIUM CHLORIDE 0.9 % IJ SOLN: 3.0000 mL | INTRAMUSCULAR | Status: DC | PRN

## 2014-09-07 MED ORDER — ONDANSETRON HCL 4 MG PO TABS: 4.0000 mg | ORAL_TABLET | Freq: Four times a day (QID) | ORAL | Status: DC | PRN

## 2014-09-07 MED ORDER — ACETAMINOPHEN 650 MG RE SUPP: 650.0000 mg | Freq: Four times a day (QID) | RECTAL | Status: DC | PRN

## 2014-09-07 MED ORDER — AMOXICILLIN-POT CLAVULANATE 875-125 MG PO TABS
1.0000 | ORAL_TABLET | Freq: Two times a day (BID) | ORAL | Status: DC
  Administered 2014-09-08 – 2014-09-10 (×6): 1 via ORAL
  Filled 2014-09-07 (×8): qty 1

## 2014-09-07 MED ORDER — HYDROMORPHONE HCL 2 MG PO TABS
4.0000 mg | ORAL_TABLET | Freq: Four times a day (QID) | ORAL | Status: DC | PRN
  Administered 2014-09-08: 4 mg via ORAL
  Filled 2014-09-07 (×2): qty 2

## 2014-09-07 MED ORDER — SODIUM CHLORIDE 0.9 % IV SOLN
250.0000 mL | INTRAVENOUS | Status: DC | PRN
  Administered 2014-09-08: 250 mL via INTRAVENOUS

## 2014-09-07 MED ORDER — ANASTROZOLE 1 MG PO TABS
1.0000 mg | ORAL_TABLET | Freq: Every day | ORAL | Status: DC
  Administered 2014-09-07 – 2014-09-09 (×3): 1 mg via ORAL
  Filled 2014-09-07 (×6): qty 1

## 2014-09-07 MED ORDER — HYDROMORPHONE HCL 2 MG PO TABS
2.0000 mg | ORAL_TABLET | Freq: Three times a day (TID) | ORAL | Status: DC | PRN
  Administered 2014-09-07: 2 mg via ORAL
  Filled 2014-09-07: qty 1

## 2014-09-07 MED ORDER — DIPHENHYDRAMINE HCL 25 MG PO CAPS
25.0000 mg | ORAL_CAPSULE | Freq: Every evening | ORAL | Status: DC | PRN
  Administered 2014-09-07 – 2014-09-09 (×3): 25 mg via ORAL
  Filled 2014-09-07 (×3): qty 1

## 2014-09-07 MED ORDER — ESCITALOPRAM OXALATE 20 MG PO TABS
20.0000 mg | ORAL_TABLET | Freq: Every day | ORAL | Status: DC
  Administered 2014-09-07 – 2014-09-10 (×4): 20 mg via ORAL
  Filled 2014-09-07 (×4): qty 1

## 2014-09-07 MED ORDER — POTASSIUM CHLORIDE 20 MEQ PO PACK: 40.0000 meq | PACK | Freq: Two times a day (BID) | ORAL | Status: DC

## 2014-09-07 MED ORDER — HEPARIN SODIUM (PORCINE) 5000 UNIT/ML IJ SOLN
5000.0000 [IU] | Freq: Three times a day (TID) | INTRAMUSCULAR | Status: DC
  Administered 2014-09-08 – 2014-09-10 (×7): 5000 [IU] via SUBCUTANEOUS
  Filled 2014-09-07 (×10): qty 1

## 2014-09-07 MED ORDER — LIDOCAINE-PRILOCAINE 2.5-2.5 % EX CREA
TOPICAL_CREAM | Freq: Once | CUTANEOUS | Status: AC
  Administered 2014-09-07: 1 via TOPICAL
  Filled 2014-09-07 (×2): qty 5

## 2014-09-07 MED ORDER — FERROUS SULFATE 325 (65 FE) MG PO TABS
325.0000 mg | ORAL_TABLET | Freq: Every day | ORAL | Status: DC
  Administered 2014-09-08 – 2014-09-10 (×3): 325 mg via ORAL
  Filled 2014-09-07 (×4): qty 1

## 2014-09-07 MED ORDER — LISINOPRIL 5 MG PO TABS
5.0000 mg | ORAL_TABLET | Freq: Every day | ORAL | Status: DC
  Administered 2014-09-07 – 2014-09-10 (×4): 5 mg via ORAL
  Filled 2014-09-07 (×4): qty 1

## 2014-09-07 MED ORDER — ACETAMINOPHEN 325 MG PO TABS: 650.0000 mg | ORAL_TABLET | Freq: Four times a day (QID) | ORAL | Status: DC | PRN

## 2014-09-07 MED ORDER — ONDANSETRON HCL 4 MG/2ML IJ SOLN
4.0000 mg | Freq: Four times a day (QID) | INTRAMUSCULAR | Status: DC | PRN
  Administered 2014-09-07: 4 mg via INTRAVENOUS
  Filled 2014-09-07: qty 2

## 2014-09-07 NOTE — Progress Notes (Signed)
Patient arrived on unit via wheelchair with family at bedside, direct admit.  Telemetry placed per MD order. Patient oriented to unit.

## 2014-09-07 NOTE — Telephone Encounter (Signed)
Dr. Raliegh Ip aware.

## 2014-09-07 NOTE — H&P (Signed)
Name: Alexandria [name] MRN: [id number] DOB: [birth year]    ADMISSION DATE:  (Not on file)   CHIEF COMPLAINT:  Hypoxia   BRIEF PATIENT DESCRIPTION: 63 yo morbidly obese female admitted from office 12/21 for Hypoxic Resp Failure with suspected diastolic failure .   SIGNIFICANT EVENTS  12/21 Admit from office   STUDIES:  12/21 VQ scan >>   HISTORY OF PRESENT ILLNESS:   63 yo obese female never smoker seen for pulmonary consult in May 2015 for dyspnea and mediastinal adenopathy. Presented to office 09/07/14 for an acute office visit. . Complains of worsening dyspnea, wheezing, cough , leg swelling over last 2 months.  On arrival to the office she has O2 sat 82% on RA .  Had recent ONO started on O2 At bedtime .  Placed on O2 at 2l/m with sats 94%. CXR showed no acute process.  Says that her DOE has been progressive, over last year.  Previous CT chest showed mild mediastinal lymphadenopathy and was repeated in 06/2014 that showed stable mediastinal lymph nodes. CT of her chest that shows at least 50% of her stomach in her left chest, with significant compromise of left lung volume.  Hx breast cancer diagnosed in January 2014 and underwent right lumpectomy and radiation therapy that finished in July of 2014 . She also has a history of a pulmonary embolus in the distant past, but had a ventilation perfusion scan 2014 that was unremarkable.  She has had chronic lower extremity edema, especially on the left since receiving chemotherapy. She says she has had venous dopplers that were neg.  The patient's weight has increased 120 pounds over the last one to 2 years. Last admission 07/14/14 for ventral incision hernia repair.  Denies chest pain, orthopnea, fever, hemoptysis , n/v/d.  Previous echo 02/2014 showed EF 55%, grade 1 DD.  Says she has been monitoring her o2 at home since Oct and has noticed that her O2 has been dropping in the 80's on room air.    PAST MEDICAL HISTORY :   has a past medical history of Iron deficiency anemia; Status post chemotherapy; Chronic back pain; pulmonary embolus; Neuropathy; Obesity; S/P radiation therapy ( 03/04/2013-04/17/2013); Diastolic heart failure; radiation therapy (03/04/13- 04/17/13); Dyspnea on exertion; CHF (congestive heart failure); Anxiety; H/O hiatal hernia; Breast cancer (10/02/12); GAD (generalized anxiety disorder); and Recurrent major depression (06/2014).  has past surgical history that includes Cesarean section; Right Breast Lumpectomy; and Partial colectomy (N/A, 07/04/2014). Prior to Admission medications   Medication Sig Start Date End Date Taking? Authorizing Provider  albuterol (PROVENTIL HFA;VENTOLIN HFA) 108 (90 BASE) MCG/ACT inhaler Inhale 2 puffs into the lungs every 6 (six) hours as needed for wheezing or shortness of breath (wheezing). 07/29/14   Marletta Lor, MD  anastrozole (ARIMIDEX) 1 MG tablet Take 1 mg by mouth daily.    Historical Provider, MD  atorvastatin (LIPITOR) 20 MG tablet Take 1 tablet (20 mg total) by mouth daily. 06/18/14   Marletta Lor, MD  docusate sodium 100 MG CAPS Take 100 mg by mouth 2 (two) times daily. 07/10/14   Megan N Dort, PA-C  escitalopram (LEXAPRO) 20 MG tablet Take 1 tablet (20 mg total) by mouth daily. 01/22/14   Heath Lark, MD  ferrous sulfate 325 (65 FE) MG tablet Take 325 mg by mouth daily with breakfast. 11/20/13   Kinnie Feil, MD  furosemide (LASIX) 80 MG tablet Take 80 mg by mouth 3 (three) times daily.    Historical Provider,  MD  HYDROcodone-homatropine (HYCODAN) 5-1.5 MG/5ML syrup Take 5 mLs by mouth every 8 (eight) hours as needed for cough. 07/29/14   Marletta Lor, MD  HYDROmorphone (DILAUDID) 4 MG tablet Take 1 tablet (4 mg total) by mouth every 6 (six) hours as needed for severe pain (pain). 07/10/14   Megan N Dort, PA-C  HYDROmorphone HCl 16 MG T24A Take 2 tablets by mouth daily.  07/23/14   Historical Provider, MD  levalbuterol Penne Lash) 0.63 MG/3ML  nebulizer solution Take 0.63 mg by nebulization every 4 (four) hours as needed for wheezing or shortness of breath (shortness of breath).    Historical Provider, MD  lidocaine (LIDODERM) 5 % Place 1-2 patches onto the skin daily. Remove & Discard patch within 12 hours or as directed by MD    Historical Provider, MD  lidocaine-prilocaine (EMLA) cream Apply 1 application topically as needed (port access).     Historical Provider, MD  LORazepam (ATIVAN) 0.5 MG tablet TAKE 1 TABLET BY MOUTH TWICE DAILY 08/28/14   Marletta Lor, MD  lubiprostone (AMITIZA) 24 MCG capsule Take 1 capsule (24 mcg total) by mouth 2 (two) times daily with a meal. 04/03/14   Heath Lark, MD  Multiple Vitamins-Minerals (HAIR/SKIN/NAILS PO) Take 1 tablet by mouth every morning.    Historical Provider, MD  potassium chloride (KLOR-CON) 20 MEQ packet Take 40 mEq by mouth 3 (three) times daily.    Historical Provider, MD   Allergies  Allergen Reactions  . Contrast Media [Iodinated Diagnostic Agents] Anaphylaxis  . Albuterol Other (See Comments)    Anxious   . Prednisone Other (See Comments)    Pt gets very agitated when she takes high doses of steroids    FAMILY HISTORY:  family history includes COPD in her sister; Emphysema in her maternal grandfather; Parkinson's disease in her mother. SOCIAL HISTORY:  reports that she has never smoked. She has never used smokeless tobacco. She reports that she does not drink alcohol or use illicit drugs.  REVIEW OF SYSTEMS:   Constitutional: No weight loss, night sweats, Fevers, chills, ++ fatigue, or lassitude.  HEENT: No headaches, Difficulty swallowing, Tooth/dental problems, or Sore throat,   No sneezing, itching, ear ache, nasal congestion, post nasal drip,   CV: No chest pain, Orthopnea, PND, ++swelling in lower extremities, anasarca, dizziness, palpitations, syncope.   GI No heartburn, indigestion, abdominal pain, nausea, vomiting, diarrhea,  change in bowel habits, loss of appetite, bloody stools.   Resp: +++ shortness of breath with exertion or at rest. No coughing up of blood. No change in color of mucus. No wheezing. No chest wall deformity  Skin: no rash or lesions.  GU: no dysuria, change in color of urine, no urgency or frequency. No flank pain, no hematuria   MS: No joint pain or swelling. No decreased range of motion. No back pain.  Psych: No change in mood or affect. No depression or anxiety. No memory loss.  SUBJECTIVE: Short of breath   VITAL SIGNS: Temp:  [97.4 F (36.3 C)] 97.4 F (36.3 C) (12/21 1107) Pulse Rate:  [78] 78 (12/21 1107) BP: (118)/(72) 118/72 mmHg (12/21 1107) SpO2:  [97 %] 97 % (12/21 1107)  PHYSICAL EXAMINATION: Physical Exam GEN: A/Ox3; pleasant , NAD, morbidly obese, unsteady with cane, crying  HEENT: [place]/AT, EACs-clear, TMs-wnl, NOSE-clear, THROAT-clear, no lesions, no postnasal drip or exudate noted.   NECK: Supple w/ fair ROM; no JVD; normal carotid impulses w/o bruits; no thyromegaly or nodules palpated; no lymphadenopathy.  RESP Diminished BS in bases no accessory muscle use, no dullness to percussion  CARD: RRR, no m/r/g , 1+ peripheral edema, pulses intact, no cyanosis or clubbing.Lymphedema on left   GI: Soft , obese & nt; nml bowel sounds; no organomegaly or masses detected.   Musco: Warm bil, no deformities or joint swelling noted.   Neuro: alert, no focal deficits noted.   Skin: Warm, no lesions or rashes   No results for input(s): NA, K, CL, CO2, BUN, CREATININE, GLUCOSE in the last 168 hours. No results for input(s): HGB, HCT, WBC, PLT in the last 168 hours. Dg Chest 2 View  09/07/2014   CLINICAL DATA:  Wheezing. Short of breath. Cough. Personal history of pulmonary embolism.  EXAM: CHEST  2 VIEW  COMPARISON:  07/05/2014 and multiple previous  FINDINGS: Heart size is normal. Large hiatal hernia again noted. Catheter projecting over the left  chest with the tip at the level of the azygos vein again noted. Mild volume loss at the left base related to the hiatal hernia. No effusions. No active process evident.  IMPRESSION: Large hiatal hernia. Mild volume loss at the left base secondary to that. No active process otherwise.   Electronically Signed   By: Nelson Chimes M.D.   On: 09/07/2014 11:45    ASSESSMENT / PLAN: 1. Acute Hypoxic Respiratory Failure +/- fluid overload  Possible Acute Bronchitis  >Pt is quite deconditioned with diastolic dysfunction , morbid obesity, chronic narcotic use and  large Hiatal Hernia  which are contributing to her progressive dyspnea and hypoxia.   Plan :  Admit from Office to Tele Check VQ scan  Begin Augmentin 875mg  Twice daily   Neb As needed      2. Acute Diastolic Decompensation  Previous echo 02/2014 showed preserved EF and grade 1 DD   Plan :  IV Lasix 40mg  x 2  Labs with bnp and bmet  Diuresis as scr and b/p allow  Check EKG  Cardiology consult  Tele  Cardiac enzymes x 1   3. Chronic pain/Anxiety   Plan  Decrease doses to avoid oversedation     Zakhi Dupre NP-C  Pulmonary and [place] Pager: [phone]  09/07/2014, 1:11 PM

## 2014-09-07 NOTE — Consult Note (Addendum)
Cardiology Consultation Note  Patient ID: Alexandria [name], MRN: [id number], DOB/AGE: [birth year] 61 y.o. 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 She saw Dr. Gwenlyn Found in 02/2014 for pre-operative evaluation in prep for large hiatal hernia repair and possible bariatric surgery. She says she used to weigh about 140lbs around the time of her cancer diagnosis but has subsequently gained about 120lbs. She has been concerned about this because she has felt that she's not really eating the amount of food it would take to gain that much weight. At time of OV in 02/2014 as part of pre-op assessment she underwent 2D echo showing elevated LV filling pressures, EF 55-60%, no RWMA, grade 1 diastolic dysfunction. The study was deemed normal and she was cleared for surgery.   She has since been followed by pulm. Over the past 2 months she has noticed progressive DOE. She went to the pulm office today for evaluation of dyspnea, wheezing, "barky" cough and leg swelling for 2 months and was found to be hypoxic at 82% on RA. She had recent ONO and was started on O2 QHS. She has noticed her O2 sats at home have been dropping into the 80's on RA. She has had chronic orthopnea since dx of breast cancer. She has LEE which has been reported to be from lymphedema. She denies any recent syncope, palpitations, or chest pain. At [facility], CXR: large hiatal hernia, mild volume loss at left base due to that  but otherwise no active process. No labs available yet. VQ ordered.  Last labs available 06/2014: Cr 0.84, Hgb 9.7.   Past Medical History  Diagnosis Date  . Iron deficiency anemia   . Status post chemotherapy     4 cycles of Taxotere and cytoxan  . Chronic back pain   . Hx of pulmonary embolus     During c-Section  . Peripheral neuropathic pain   . Obesity     Class 2  . S/P radiation therapy  03/04/2013-04/17/2013    1) Right breast / 50 Gy in 25 fractions/ 2) Right breast boost / 10 Gy in 5 fractions  . Diastolic heart failure   . Hx of radiation therapy 03/04/13- 04/17/13    right breast 50 Gy 25 fractions, right breast boost 10 Gy 5 fractions  . Anxiety   . H/O hiatal hernia   . 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 GAD (generalized anxiety disorder)   . 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 Louretta Shorten  . Lymphedema       Most Recent Cardiac Studies:  2D Echo 03/09/14 - Left ventricle: E/e&'>15 suggestive of elevated LV filling pressures. The cavity size was normal. Systolic function was normal. The estimated ejection fraction was in the range of 55% to 60%. Wall motion was normal; there were no regional wall motion abnormalities. There was an increased relative  contribution of atrial contraction to ventricular filling. Doppler parameters are consistent with abnormal left ventricular relaxation (grade 1 diastolic dysfunction). - Tricuspid valve: There was trivial regurgitation.    Surgical History:  Past Surgical History  Procedure Laterality Date  . Cesarean section    . Right breast lumpectomy    . Partial colectomy N/A 07/04/2014    Procedure: REPAIR OF INCARCERATED INCISIONAL HERNIA WITH MESH;  Surgeon: Excell Seltzer, MD;  Location: [facility]S;  Service: General;  Laterality: N/A;     Home Meds: Prior to Admission medications    Medication Sig Start Date End Date Taking? Authorizing Provider  albuterol (PROVENTIL HFA;VENTOLIN HFA) 108 (90 BASE) MCG/ACT inhaler Inhale 2 puffs into the lungs every 6 (six) hours as needed for wheezing or shortness of breath (wheezing). 07/29/14   Marletta Lor, MD  anastrozole (ARIMIDEX) 1 MG tablet Take 1 mg by mouth daily.    Historical Provider, MD  atorvastatin (LIPITOR) 20 MG tablet Take 1 tablet (20 mg total) by mouth daily. 06/18/14   Marletta Lor, MD  docusate sodium 100 MG CAPS Take 100 mg by mouth 2 (two) times daily. 07/10/14   Megan N Dort, PA-C  escitalopram (LEXAPRO) 20 MG tablet Take 1 tablet (20 mg total) by mouth daily. 01/22/14   Heath Lark, MD  ferrous sulfate 325 (65 FE) MG tablet Take 325 mg by mouth daily with breakfast. 11/20/13   Kinnie Feil, MD  furosemide (LASIX) 80 MG tablet Take 80 mg by mouth 3 (three) times daily.    Historical Provider, MD  HYDROcodone-homatropine (HYCODAN) 5-1.5 MG/5ML syrup Take 5 mLs by mouth every 8 (eight) hours as needed for cough. 07/29/14   Marletta Lor, MD  HYDROmorphone (DILAUDID) 4 MG tablet Take 1 tablet (4 mg total) by mouth every 6 (six) hours as needed for severe pain (pain). 07/10/14   Megan N Dort, PA-C  HYDROmorphone HCl 16 MG T24A Take 2 tablets by mouth daily.  07/23/14   Historical Provider, MD  levalbuterol Penne Lash) 0.63 MG/3ML nebulizer solution Take 0.63 mg by nebulization every 4 (four) hours as needed for wheezing or shortness of breath (shortness of breath).    Historical Provider, MD  lidocaine (LIDODERM) 5 % Place 1-2 patches onto the skin daily. Remove & Discard patch within 12 hours or as directed by MD    Historical Provider, MD  lidocaine-prilocaine (EMLA) cream Apply 1 application topically as needed (port access).     Historical Provider, MD  LORazepam (ATIVAN) 0.5 MG tablet TAKE 1 TABLET BY MOUTH TWICE DAILY 08/28/14   Marletta Lor, MD  lubiprostone (AMITIZA) 24 MCG capsule Take 1  capsule (24 mcg total) by mouth 2 (two) times daily with a meal. 04/03/14   Heath Lark, MD  Multiple Vitamins-Minerals (HAIR/SKIN/NAILS PO) Take 1 tablet by mouth every morning.    Historical Provider, MD  potassium chloride (KLOR-CON) 20 MEQ packet Take 40 mEq by mouth 3 (three) times daily.    Historical Provider, MD    Inpatient Medications:  . amoxicillin-clavulanate  1 tablet Oral Q12H  . anastrozole  1 mg Oral Daily  . atorvastatin  20 mg Oral q1800  . docusate sodium  100 mg Oral BID  . escitalopram  20 mg Oral Daily  . [START ON 09/08/2014] ferrous sulfate  325 mg Oral Q breakfast  . heparin  5,000 Units Subcutaneous 3 times per day  . lidocaine-prilocaine   Topical Once  . lubiprostone  24 mcg Oral  BID WC  . potassium chloride  40 mEq Oral BID  . sodium chloride  3 mL Intravenous Q12H  . sodium chloride  3 mL Intravenous Q12H      Allergies:  Allergies  Allergen Reactions  . Contrast Media [Iodinated Diagnostic Agents] Anaphylaxis  . Albuterol Other (See Comments)    Anxious   . Prednisone Other (See Comments)    Pt gets very agitated when she takes high doses of steroids    History   Social History  . Marital Status: Married    Spouse Name: N/A    Number of Children: N/A  . Years of Education: N/A   Occupational History  . retired Therapist, sports    Social History Main Topics  . Smoking status: Never Smoker   . Smokeless tobacco: Never Used  . Alcohol Use: No  . Drug Use: No  . Sexual Activity: Yes   Other Topics Concern  . Not on file   Social History Narrative     Family History  Problem Relation Age of Onset  . Parkinson's disease Mother   . Emphysema Maternal Grandfather   . COPD Sister   . CAD Neg Hx      Review of Systems: General: negative for fever  Cardiovascular: SEE ABOVE Dermatological: negative for rash Respiratory: + cough, wheezing Urologic: negative for hematuria Abdominal: negative for nausea, vomiting, diarrhea, bright red blood per  rectum, melena, or hematemesis Neurologic: negative for syncope All other systems reviewed and are otherwise negative except as noted above.  Labs: None available yet this admission  Radiology/Studies:  Dg Chest 2 View  09/07/2014   CLINICAL DATA:  Wheezing. Short of breath. Cough. Personal history of pulmonary embolism.  EXAM: CHEST  2 VIEW  COMPARISON:  07/05/2014 and multiple previous  FINDINGS: Heart size is normal. Large hiatal hernia again noted. Catheter projecting over the left chest with the tip at the level of the azygos vein again noted. Mild volume loss at the left base related to the hiatal hernia. No effusions. No active process evident.  IMPRESSION: Large hiatal hernia. Mild volume loss at the left base secondary to that. No active process otherwise.   Electronically Signed   By:  Chimes M.D.   On: 09/07/2014 11:45   Echo 03/09/2014 Study Conclusions  - Left ventricle: E/e&'>15 suggestive of elevated LV filling pressures. The cavity size was normal. Systolic function was normal. The estimated ejection fraction was in the range of 55% to 60%. Wall motion was normal; there were no regional wall motion abnormalities. There was an increased relative contribution of atrial contraction to ventricular filling. Doppler parameters are consistent with abnormal left ventricular relaxation (grade 1 diastolic dysfunction). - Tricuspid valve: There was trivial regurgitation.  EKG: NSR 1st degree AVB, upsloped ST baseline in II, III, avF, V5-V6, comparable to prior tracing  Physical Exam: Blood pressure 160/68, pulse 98, temperature 98.6 F (37 C), temperature source Oral, resp. rate 24, height 5' 8"  (1.727 m), weight 262 lb 9.1 oz (119.1 kg), last menstrual period 01/30/2005, SpO2 91 %. General: Well developed obese WF in no acute distress. Head: Normocephalic, atraumatic, sclera non-icteric, no xanthomas, nares are without discharge.  Neck: JVD not elevated.  Negative for carotid bruits. Lungs: Diminished air movement with inspiratory wheezing. No rales or rhonchi. Breathing is unlabored. Heart: RRR with S1 S2. No murmurs, rubs, or gallops appreciated. Abdomen: Soft, non-tender, non-distended with normoactive bowel sounds. No hepatomegaly. No rebound/guarding. No obvious abdominal masses. Msk:  Strength and  tone appear normal for age. Extremities: No clubbing or cyanosis. 2+ bilateral nonpitting lymphedema.  Distal pedal pulses are 2+ and equal bilaterally. Neuro: Alert and oriented X 3. No facial asymmetry. No focal deficit. Moves all extremities spontaneously. Psych:  Responds to questions appropriately with a normal affect.   Assessment and Plan:   1. Acute respiratory failure with hypoxia 2. ?Possible acute bronchitis 3. Suspected acute on chronic diastolic CHF 4. Obesity BMI 39.93 kg/(m^2). with rapid impressive weight loss 5. Elevated BP without prior diagnosis of HTN (pt reports labile BP) 6. Breast CA s/p chemo, radiation, surgery  Will discuss plan with Dr. Meda Coffee. See below for comprehensive thoughts.  Signed, Melina Copa PA-C 09/07/2014, 7:52 PM   The patient was seen, examined and discussed with Melina Copa, PA-C and I agree with the above.   63 year old female with h/o breast cancer treated with adjuvant chemotherapy consisting of 4 cycles of Taxotere and Cytoxan that presented with progressively worsening LE edema, weight gain and SOB that got significantly worse over the last 3 days. She has also experienced chills and subjective fever.  I believe that her SOB is multifactorial, she has an acute upper respiratory infection (inspiratory stridor, chills), she has been started on antibiotics and respiratory therapy by pulmonary. Its hard to determine the degree of heart failure, she has chronic lymphedema, that she developed since she was on chemotherapy. Both of Taxotere and Cytoxan are associated with possible CHF infrequently.  She finished her chemo in February 2014, her echocardiogram on 03/09/2014 showed preserved LVEF with impaired relaxation and elevated filling pressures. LV dysfunction most commonly occurs 3 months post chemotherapy.  We will repeat with strain. We will start iv Lasix and lisinopril 5 mg po daily.  It would be worth to consider referring her to [facility] long physical therapy where they treat lymphedema as an outpatient.  We will follow.  Dorothy Spark 09/07/2014

## 2014-09-07 NOTE — Progress Notes (Addendum)
Patient requesting something for sleep and an increase in pain medication dosage.  Stated dilaudid 2 mg PO was insufficient due to her chronic back pain.  Spoke with Dr. Joya Gaskins regarding patient medications.  Verbal orders received for benadryl 25 mg PO for sleep and dilaudid 4 mg PO Q6hrs PRN.

## 2014-09-07 NOTE — Telephone Encounter (Signed)
Pt wants you to know pulmonary dr is admitting her for her breathing issue to issue

## 2014-09-07 NOTE — Progress Notes (Signed)
Subjective:    Patient ID: Alexandria [name], female    DOB: 10-Jan-1951, 63 y.o.   MRN: [id number]  HPI 63 yo obese female never smoker seen for pulmonary consult in  May 2015 for dyspnea and mediastinal adenopathy.   09/07/2014 Acute OV  Pt presents for an acute office visit. Complains of worsening dyspnea, wheezing, cough , leg swelling over last 2 months.  On arrival to the office she has O2 sat 82% on RA .  Had recent ONO started on O2 At bedtime  .  Placed on O2 at 2l/m with sats 94%. CXR showed no acute process.  Says that her DOE has been progressive, over last year.  Previous CT chest showed mild mediastinal lymphadenopathy and was repeated in 06/2014 that showed stable mediastinal lymph nodes.  CT of her chest that shows at least 50% of her stomach in her left chest, with significant compromise of left lung volume.   Hx breast cancer diagnosed in January 2014  and underwent right lumpectomy and radiation therapy that finished in July of 2014 . She also has a history of a pulmonary embolus in the distant past, but had a ventilation perfusion scan 2014  that was unremarkable.   She has had chronic lower extremity edema, especially on the left since receiving chemotherapy. She says she has had venous dopplers that were neg.  The patient's weight has increased 120 pounds over the last one to 2 years. Last admission 07/14/14 for ventral incision hernia repair.  Denies chest pain, orthopnea, fever, hemoptysis , n/v/d.  Previous echo 02/2014 showed EF 55%, grade 1 DD.  Says she has been monitoring her o2 at home since Oct and has noticed that her O2 has been dropping in the 80's on room air.      Review of Systems  Constitutional:   No  weight loss, night sweats,  Fevers, chills, ++ fatigue, or  lassitude.  HEENT:   No headaches,  Difficulty swallowing,  Tooth/dental problems, or  Sore throat,                No sneezing, itching, ear ache, nasal congestion, post nasal drip,   CV:  No  chest pain,  Orthopnea, PND, ++swelling in lower extremities, anasarca, dizziness, palpitations, syncope.   GI  No heartburn, indigestion, abdominal pain, nausea, vomiting, diarrhea, change in bowel habits, loss of appetite, bloody stools.   Resp: +++ shortness of breath with exertion or at rest.    No coughing up of blood.  No change in color of mucus.  No wheezing.  No chest wall deformity  Skin: no rash or lesions.  GU: no dysuria, change in color of urine, no urgency or frequency.  No flank pain, no hematuria   MS:  No joint pain or swelling.  No decreased range of motion.  No back pain.  Psych:  No change in mood or affect. No depression or anxiety.  No memory loss.          Objective:   Physical Exam GEN: A/Ox3; pleasant , NAD, morbidly obese, unsteady with cane, crying   HEENT:  [place]/AT,  EACs-clear, TMs-wnl, NOSE-clear, THROAT-clear, no lesions, no postnasal drip or exudate noted.   NECK:  Supple w/ fair ROM; no JVD; normal carotid impulses w/o bruits; no thyromegaly or nodules palpated; no lymphadenopathy.  RESP  Diminished BS in bases no accessory muscle use, no dullness to percussion  CARD:  RRR, no m/r/g  , 1+ peripheral edema, pulses  intact, no cyanosis or clubbing.Lymphedema on left   GI:   Soft , obese & nt; nml bowel sounds; no organomegaly or masses detected.  Musco: Warm bil, no deformities or joint swelling noted.   Neuro: alert, no focal deficits noted.    Skin: Warm, no lesions or rashes   CXR 09/07/2014  Large hiatal hernia. Mild volume loss at the left base secondary to that. No active process otherwise.        Assessment & Plan:

## 2014-09-07 NOTE — Assessment & Plan Note (Signed)
Admit to hospital for further evaluation

## 2014-09-08 ENCOUNTER — Inpatient Hospital Stay (HOSPITAL_COMMUNITY): Payer: Medicare Other

## 2014-09-08 DIAGNOSIS — I10 Essential (primary) hypertension: Secondary | ICD-10-CM | POA: Insufficient documentation

## 2014-09-08 LAB — TROPONIN I: Troponin I: <0.03 ng/mL (ref <0.30)

## 2014-09-08 LAB — COMPREHENSIVE METABOLIC PANEL
ALT: 29 U/L (ref 0–35)
AST: 29 U/L (ref 0–37)
Albumin: 3.4 g/dL — ABNORMAL LOW (ref 3.5–5.2)
Alkaline Phosphatase: 188 U/L — ABNORMAL HIGH (ref 39–117)
Anion gap: 10 (ref 5–15)
BUN: 19 mg/dL (ref 6–23)
CO2: 31 mmol/L (ref 19–32)
Calcium: 8.6 mg/dL (ref 8.4–10.5)
Chloride: 98 mEq/L (ref 96–112)
Creatinine, Ser: 1 mg/dL (ref 0.50–1.10)
GFR, EST AFRICAN AMERICAN: 69 mL/min — AB (ref >90)
GFR, EST NON AFRICAN AMERICAN: 60 mL/min — AB (ref >90)
GLUCOSE: 106 mg/dL — AB (ref 70–99)
POTASSIUM: 2.8 meq/L — AB (ref 3.7–5.3)
SODIUM: 139 meq/L (ref 137–147)
TOTAL PROTEIN: 6.6 g/dL (ref 6.0–8.3)
Total Bilirubin: 0.3 mg/dL (ref 0.3–1.2)

## 2014-09-08 LAB — URINALYSIS, ROUTINE W REFLEX MICROSCOPIC
BILIRUBIN URINE: NEGATIVE
GLUCOSE, UA: NEGATIVE mg/dL
HGB URINE DIPSTICK: NEGATIVE
KETONES UR: NEGATIVE mg/dL
NITRITE: NEGATIVE
PH: 6.5 (ref 5.0–8.0)
Protein, ur: NEGATIVE mg/dL
Specific Gravity, Urine: 1.012 (ref 1.005–1.030)
Urobilinogen, UA: 0.2 mg/dL (ref 0.0–1.0)

## 2014-09-08 LAB — URINE MICROSCOPIC-ADD ON

## 2014-09-08 LAB — BASIC METABOLIC PANEL
Anion gap: 6 (ref 5–15)
BUN: 19 mg/dL (ref 6–23)
CHLORIDE: 99 meq/L (ref 96–112)
CO2: 34 mmol/L — AB (ref 19–32)
Calcium: 8.4 mg/dL (ref 8.4–10.5)
Creatinine, Ser: 1.09 mg/dL (ref 0.50–1.10)
GFR calc Af Amer: 62 mL/min — ABNORMAL LOW (ref >90)
GFR calc non Af Amer: 54 mL/min — ABNORMAL LOW (ref >90)
Glucose, Bld: 125 mg/dL — ABNORMAL HIGH (ref 70–99)
Potassium: 3.6 mmol/L (ref 3.5–5.1)
Sodium: 139 mmol/L (ref 135–145)

## 2014-09-08 LAB — GLUCOSE, CAPILLARY: Glucose-Capillary: 105 mg/dL — ABNORMAL HIGH (ref 70–99)

## 2014-09-08 MED ORDER — PREDNISONE 50 MG PO TABS
50.0000 mg | ORAL_TABLET | Freq: Every day | ORAL | Status: DC
  Administered 2014-09-08 – 2014-09-09 (×2): 50 mg via ORAL
  Filled 2014-09-08 (×4): qty 1

## 2014-09-08 MED ORDER — SODIUM CHLORIDE 0.9 % IJ SOLN: 10.0000 mL | Freq: Two times a day (BID) | INTRAMUSCULAR | Status: DC

## 2014-09-08 MED ORDER — LORAZEPAM 1 MG PO TABS
1.0000 mg | ORAL_TABLET | ORAL | Status: AC
  Administered 2014-09-08: 1 mg via ORAL
  Filled 2014-09-08: qty 1

## 2014-09-08 MED ORDER — LEVALBUTEROL HCL 0.63 MG/3ML IN NEBU
0.6300 mg | INHALATION_SOLUTION | Freq: Three times a day (TID) | RESPIRATORY_TRACT | Status: DC
  Administered 2014-09-09 (×2): 0.63 mg via RESPIRATORY_TRACT
  Filled 2014-09-08 (×11): qty 3

## 2014-09-08 MED ORDER — POTASSIUM CHLORIDE CRYS ER 20 MEQ PO TBCR
40.0000 meq | EXTENDED_RELEASE_TABLET | ORAL | Status: AC
  Administered 2014-09-08 (×2): 40 meq via ORAL
  Filled 2014-09-08: qty 2

## 2014-09-08 MED ORDER — IPRATROPIUM-ALBUTEROL 0.5-2.5 (3) MG/3ML IN SOLN: 3.0000 mL | RESPIRATORY_TRACT | Status: DC

## 2014-09-08 MED ORDER — LEVALBUTEROL HCL 0.63 MG/3ML IN NEBU
0.6300 mg | INHALATION_SOLUTION | Freq: Four times a day (QID) | RESPIRATORY_TRACT | Status: DC
Start: 2014-09-08 — End: 2014-09-08
  Administered 2014-09-08 (×2): 0.63 mg via RESPIRATORY_TRACT
  Filled 2014-09-08 (×2): qty 3

## 2014-09-08 MED ORDER — SODIUM CHLORIDE 0.9 % IJ SOLN
10.0000 mL | INTRAMUSCULAR | Status: DC | PRN
  Administered 2014-09-09 – 2014-09-10 (×3): 10 mL
  Filled 2014-09-08 (×3): qty 40

## 2014-09-08 MED ORDER — TECHNETIUM TC 99M DIETHYLENETRIAME-PENTAACETIC ACID: 40.0000 | Freq: Once | INTRAVENOUS | Status: AC | PRN

## 2014-09-08 MED ORDER — TECHNETIUM TO 99M ALBUMIN AGGREGATED
6.0000 | Freq: Once | INTRAVENOUS | Status: AC | PRN
  Administered 2014-09-08: 6 via INTRAVENOUS

## 2014-09-08 MED ORDER — HYDROMORPHONE HCL 2 MG PO TABS
8.0000 mg | ORAL_TABLET | Freq: Two times a day (BID) | ORAL | Status: DC
  Administered 2014-09-08 – 2014-09-09 (×3): 8 mg via ORAL
  Filled 2014-09-08 (×3): qty 4

## 2014-09-08 NOTE — Progress Notes (Signed)
Utilization Review Completed.  

## 2014-09-08 NOTE — H&P (Addendum)
Name: Alexandria [name] MRN: [id number] DOB: 05-10-1951    ADMISSION DATE:  09/07/2014   CHIEF COMPLAINT:  Hypoxia   BRIEF PATIENT DESCRIPTION: 63 yo morbidly obese female admitted from office 12/21 for Hypoxic Resp Failure with suspected diastolic failure .   SIGNIFICANT EVENTS  12/21 Admit from office   STUDIES:  12/21 VQ scan >>   HISTORY OF PRESENT ILLNESS:   63 yo obese female never smoker seen for pulmonary consult in May 2015 for dyspnea and mediastinal adenopathy. Presented to office 09/07/14 for an acute office visit. . Complains of worsening dyspnea, wheezing, cough , leg swelling over last 2 months.  On arrival to the office she has O2 sat 82% on RA .  Had recent ONO started on O2 At bedtime .  Placed on O2 at 2l/m with sats 94%. CXR showed no acute process.  Says that her DOE has been progressive, over last year.  Previous CT chest showed mild mediastinal lymphadenopathy and was repeated in 06/2014 that showed stable mediastinal lymph nodes. CT of her chest that shows at least 50% of her stomach in her left chest, with significant compromise of left lung volume.  Hx breast cancer diagnosed in January 2014 and underwent right lumpectomy and radiation therapy that finished in July of 2014 . She also has a history of a pulmonary embolus in the distant past, but had a ventilation perfusion scan 2014 that was unremarkable.  She has had chronic lower extremity edema, especially on the left since receiving chemotherapy. She says she has had venous dopplers that were neg.  The patient's weight has increased 120 pounds over the last one to 2 years. Last admission 07/14/14 for ventral incision hernia repair.  Denies chest pain, orthopnea, fever, hemoptysis , n/v/d.  Previous echo 02/2014 showed EF 55%, grade 1 DD.  Says she has been monitoring her o2 at home since Oct and has noticed that her O2 has been dropping in the 80's on room air.    SUBJECTIVE: Short of breath     VITAL SIGNS: Temp:  [97.5 F (36.4 C)-98.6 F (37 C)] 97.8 F (36.6 C) (12/22 0951) Pulse Rate:  [84-98] 84 (12/22 0951) Resp:  [19-24] 19 (12/22 0951) BP: (95-160)/(54-68) 95/54 mmHg (12/22 0951) SpO2:  [91 %-97 %] 95 % (12/22 0951) Weight:  [119.1 kg (262 lb 9.1 oz)] 119.1 kg (262 lb 9.1 oz) (12/21 2045)  PHYSICAL EXAMINATION: General:  Obese female in NAD Neuro:  Alert, oriented x 3, non-focal HEENT:  [place]/AT, PERRL, no JVD noted Cardiovascular:  RRR, no MRG Lungs: Good air movement, mild inspiratory wheeze. She states this is normal for her. Sats 95 on rrom air.  Abdomen:  Soft, non-tender, non-distended Musculoskeletal:  ROM intact, no acute deformity, edema to BLE Skin:  Intact, MMM    Recent Labs Lab 09/07/14 2245 09/08/14 0545  NA 139 139  K 2.8* 3.6  CL 98 99  CO2 31 34*  BUN 19 19  CREATININE 1.00 1.09  GLUCOSE 106* 125*    Recent Labs Lab 09/07/14 2245  HGB 9.7*  HCT 31.5*  WBC 6.8  PLT 256   Dg Chest 2 View  09/07/2014   CLINICAL DATA:  Wheezing. Short of breath. Cough. Personal history of pulmonary embolism.  EXAM: CHEST  2 VIEW  COMPARISON:  07/05/2014 and multiple previous  FINDINGS: Heart size is normal. Large hiatal hernia again noted. Catheter projecting over the left chest with the tip at the level of the azygos  vein again noted. Mild volume loss at the left base related to the hiatal hernia. No effusions. No active process evident.  IMPRESSION: Large hiatal hernia. Mild volume loss at the left base secondary to that. No active process otherwise.   Electronically Signed   By: Nelson Chimes M.D.   On: 09/07/2014 11:45    ASSESSMENT / PLAN:  Acute Hypoxic Respiratory Failure +/- fluid overload  Possible Acute Bronchitis  Volume loss due to large hiatal hernia -Supplemental O2 at night and PRN during the day to keep SpO2 > 92% -For VQ scan later today -1mg  ativan 30 mins pre-scan, had to stop scan yesterday due to anxiety.  -Augmentin 875mg   Twice daily   -Neb As needed    Acute on chronic diastolic HF Previous echo 02/2014 showed preserved EF and grade 1 DD  - Cards following, started lasix and lisinopril 12/22 - Tele  - Cards to repeat echo with strain.   Chronic pain/Anxiety  - Add back hydromorphone at decreased dose  Chronic lymphedema to BLE L>R, negative dopplers in past - monitor   VTE ppx: SQ heparin Diet: Heart healthy  Georgann Housekeeper, ACNP [place] Pager [phone] or [phone]  Diurese , await VQ scan - note low prob 03/2013  Care during the described time interval was provided by me and/or other providers on the critical care team.  I have reviewed this patient's available data, including medical history, events of note, physical examination and test results as part of my evaluation  Rigoberto Noel MD

## 2014-09-08 NOTE — Progress Notes (Signed)
Patient Profile: 63 year old female with h/o breast cancer treated with adjuvant chemotherapy consisting of 4 cycles of Taxotere and Cytoxan that presented with progressively worsening LE edema, weight gain and SOB that got significantly worse over the last 3 days. She has also experienced chills and subjective fever.  Subjective: Breathing slightly improved but still with occasional dyspnea. She is currently w/o need for supplemental O2. Still with wheezing.   Objective: Vital signs in last 24 hours: Temp:  [97.5 F (36.4 C)-98.6 F (37 C)] 97.8 F (36.6 C) (12/22 0951) Pulse Rate:  [84-98] 84 (12/22 0951) Resp:  [19-24] 19 (12/22 0951) BP: (95-160)/(54-68) 95/54 mmHg (12/22 0951) SpO2:  [91 %-97 %] 95 % (12/22 0951) Weight:  [262 lb 9.1 oz (119.1 kg)] 262 lb 9.1 oz (119.1 kg) (12/21 2045) Last BM Date: 09/07/14  Intake/Output from previous day: 12/21 0701 - 12/22 0700 In: 480 [P.O.:480] Out: -  Intake/Output this shift: Total I/O In: 120 [P.O.:120] Out: -   Medications . amoxicillin-clavulanate  1 tablet Oral Q12H  . anastrozole  1 mg Oral Daily  . atorvastatin  20 mg Oral q1800  . docusate sodium  100 mg Oral BID  . escitalopram  20 mg Oral Daily  . ferrous sulfate  325 mg Oral Q breakfast  . furosemide  40 mg Intravenous BID  . heparin  5,000 Units Subcutaneous 3 times per day  . HYDROmorphone  8 mg Oral BID  . levalbuterol  0.63 mg Nebulization Q6H  . lisinopril  5 mg Oral Daily  . lubiprostone  24 mcg Oral BID WC  . potassium chloride  40 mEq Oral BID  . sodium chloride  10-40 mL Intracatheter Q12H  . sodium chloride  3 mL Intravenous Q12H  . sodium chloride  3 mL Intravenous Q12H     PE: General appearance: alert, cooperative, no distress and morbidly obese Neck: no carotid bruit and no JVD Lungs: with expiratory wheezing but no rales Heart: regular rate and rhythm, S1, S2 normal, no murmur, click, rub or gallop Extremities: bilateral LEE Pulses: 2+  and symmetric Skin: warm and dry Neurologic: Grossly normal  Lab Results:   Recent Labs  09/07/14 2245  WBC 6.8  HGB 9.7*  HCT 31.5*  PLT 256   BMET  Recent Labs  09/07/14 2245 09/08/14 0545  NA 139 139  K 2.8* 3.6  CL 98 99  CO2 31 34*  GLUCOSE 106* 125*  BUN 19 19  CREATININE 1.00 1.09  CALCIUM 8.6 8.4    Assessment/Plan  Active Problems:   Malignant neoplasm of upper-outer quadrant of female breast   Acute on chronic diastolic CHF (congestive heart failure)   Acute respiratory failure   Acute bronchitis   Elevated blood pressure   Lymphedema   Obesity  1. ARF w/ hypoxia: felt to be multifactorial likely URI and acute on chronic diastolic HF .Some improvement with Lasix. Will order 2D echo with strain. Continue lisinopril. Will also change PRN Xopenex to scheduled Q4H.     LOS: 1 day    Brittainy M. Ladoris Gene 09/08/2014 2:05 PM  The patient was seen, examined and discussed with Brittainy M. Rosita Fire, PA-C and I agree with the above.    63 year old female with h/o breast cancer treated with adjuvant chemotherapy consisting of 4 cycles of Taxotere and Cytoxan that presented with progressively worsening LE edema, weight gain and SOB that got significantly worse over the last 3 days. She has also experienced  chills and subjective fever.  I believe that her SOB is multifactorial, she has an acute upper respiratory infection (inspiratory stridor, wheezing, chills), she has been started on antibiotics, but hasn't received any respiratory therapy, she is in acute respiratory distress, I will start prednisone and albuterol/atrovent around the clock.  No signs of significant fluid overload on exam. Echo is pending.   Its hard to determine the degree of heart failure, she has chronic lymphedema, that she developed since she was on chemotherapy. Both of Taxotere and Cytoxan are associated with possible CHF infrequently. She finished her chemo in February 2014, her  echocardiogram on 03/09/2014 showed preserved LVEF with impaired relaxation and elevated filling pressures. LV dysfunction most commonly occurs 3 months post chemotherapy.  It would be worth to consider referring her to [facility] long physical therapy where they treat lymphedema as an outpatient.  We will follow.  Dorothy Spark 09/08/2014

## 2014-09-08 NOTE — Progress Notes (Signed)
CRITICAL VALUE ALERT  Critical value received:  Potassium 2.8  Date of notification:  09/08/2014  Time of notification:  0037  Critical value read back:Yes.    Nurse who received alert:  Arlyss Queen   MD notified (1st page):  Deterding MD  Time of first page:  0045  Responding MD:  Deterding MD  Time MD responded:  [phone]   Orders received for potassium 40 meq PO, and to continue to give lasix IV.  Will continue to monitor.

## 2014-09-09 ENCOUNTER — Inpatient Hospital Stay (HOSPITAL_COMMUNITY): Payer: Medicare Other

## 2014-09-09 DIAGNOSIS — I10 Essential (primary) hypertension: Secondary | ICD-10-CM

## 2014-09-09 DIAGNOSIS — R059 Cough, unspecified: Secondary | ICD-10-CM | POA: Insufficient documentation

## 2014-09-09 DIAGNOSIS — R0902 Hypoxemia: Secondary | ICD-10-CM | POA: Insufficient documentation

## 2014-09-09 DIAGNOSIS — J209 Acute bronchitis, unspecified: Secondary | ICD-10-CM

## 2014-09-09 DIAGNOSIS — R05 Cough: Secondary | ICD-10-CM

## 2014-09-09 LAB — BASIC METABOLIC PANEL
Anion gap: 7 (ref 5–15)
BUN: 22 mg/dL (ref 6–23)
CO2: 30 mmol/L (ref 19–32)
Calcium: 9 mg/dL (ref 8.4–10.5)
Chloride: 100 mEq/L (ref 96–112)
Creatinine, Ser: 1.36 mg/dL — ABNORMAL HIGH (ref 0.50–1.10)
GFR calc Af Amer: 48 mL/min — ABNORMAL LOW (ref >90)
GFR calc non Af Amer: 41 mL/min — ABNORMAL LOW (ref >90)
Glucose, Bld: 155 mg/dL — ABNORMAL HIGH (ref 70–99)
POTASSIUM: 4.9 mmol/L (ref 3.5–5.1)
SODIUM: 137 mmol/L (ref 135–145)

## 2014-09-09 LAB — CBC
HEMATOCRIT: 33.6 % — AB (ref 36.0–46.0)
HEMOGLOBIN: 10 g/dL — AB (ref 12.0–15.0)
MCH: 23.6 pg — AB (ref 26.0–34.0)
MCHC: 29.8 g/dL — ABNORMAL LOW (ref 30.0–36.0)
MCV: 79.2 fL (ref 78.0–100.0)
Platelets: 260 10*3/uL (ref 150–400)
RBC: 4.24 MIL/uL (ref 3.87–5.11)
RDW: 17.9 % — ABNORMAL HIGH (ref 11.5–15.5)
WBC: 7.2 10*3/uL (ref 4.0–10.5)

## 2014-09-09 LAB — GLUCOSE, CAPILLARY: Glucose-Capillary: 130 mg/dL — ABNORMAL HIGH (ref 70–99)

## 2014-09-09 MED ORDER — HYDROMORPHONE HCL ER 8 MG PO T24A
16.0000 mg | EXTENDED_RELEASE_TABLET | Freq: Every day | ORAL | Status: DC
  Administered 2014-09-10: 16 mg via ORAL
  Filled 2014-09-09: qty 2

## 2014-09-09 MED ORDER — DEXTROMETHORPHAN POLISTIREX 30 MG/5ML PO LQCR
30.0000 mg | Freq: Two times a day (BID) | ORAL | Status: DC
  Administered 2014-09-09 (×2): 30 mg via ORAL
  Filled 2014-09-09 (×4): qty 5

## 2014-09-09 MED ORDER — HYDROMORPHONE HCL 2 MG PO TABS
4.0000 mg | ORAL_TABLET | Freq: Four times a day (QID) | ORAL | Status: DC | PRN
  Administered 2014-09-09 – 2014-09-10 (×4): 4 mg via ORAL
  Filled 2014-09-09 (×4): qty 2

## 2014-09-09 MED ORDER — FENTANYL CITRATE 0.05 MG/ML IJ SOLN
25.0000 ug | Freq: Once | INTRAMUSCULAR | Status: AC
Start: 2014-09-09 — End: 2014-09-09
  Administered 2014-09-09: 25 ug via INTRAVENOUS
  Filled 2014-09-09: qty 2

## 2014-09-09 MED ORDER — PREDNISONE 10 MG PO TABS
30.0000 mg | ORAL_TABLET | Freq: Every day | ORAL | Status: DC
  Administered 2014-09-10: 30 mg via ORAL
  Filled 2014-09-09 (×2): qty 1

## 2014-09-09 MED ORDER — HYDROMORPHONE HCL 2 MG PO TABS
8.0000 mg | ORAL_TABLET | Freq: Once | ORAL | Status: AC
  Administered 2014-09-09: 8 mg via ORAL
  Filled 2014-09-09: qty 4

## 2014-09-09 MED ORDER — HYDROMORPHONE HCL ER 8 MG PO T24A: 16.0000 mg | EXTENDED_RELEASE_TABLET | Freq: Every day | ORAL | Status: DC

## 2014-09-09 MED ORDER — CLOTRIMAZOLE 1 % EX CREA
TOPICAL_CREAM | Freq: Two times a day (BID) | CUTANEOUS | Status: DC
  Administered 2014-09-09: 23:00:00 via TOPICAL
  Filled 2014-09-09: qty 15

## 2014-09-09 NOTE — Progress Notes (Signed)
Patient Profile: 63 year old female with h/o breast cancer treated with adjuvant chemotherapy consisting of 4 cycles of Taxotere and Cytoxan that presented with progressively worsening LE edema, weight gain and SOB that got significantly worse over the last 3 days. She has also experienced chills and subjective fever.  Subjective: Breathing slightly improved but still severe wheezing and SOB while walking.   Objective: Vital signs in last 24 hours: Temp:  [97.8 F (36.6 C)-98.4 F (36.9 C)] 98.4 F (36.9 C) (12/23 0500) Pulse Rate:  [68-105] 68 (12/23 0500) Resp:  [17-19] 17 (12/23 0500) BP: (95-132)/(54-87) 112/62 mmHg (12/23 0500) SpO2:  [90 %-96 %] 90 % (12/23 0500) Weight:  [264 lb 5.3 oz (119.9 kg)] 264 lb 5.3 oz (119.9 kg) (12/22 2120) Last BM Date: 09/08/14  Intake/Output from previous day: 12/22 0701 - 12/23 0700 In: 360 [P.O.:360] Out: 900 [Urine:900] Intake/Output this shift:    Medications . amoxicillin-clavulanate  1 tablet Oral Q12H  . anastrozole  1 mg Oral Daily  . atorvastatin  20 mg Oral q1800  . docusate sodium  100 mg Oral BID  . escitalopram  20 mg Oral Daily  . ferrous sulfate  325 mg Oral Q breakfast  . furosemide  40 mg Intravenous BID  . heparin  5,000 Units Subcutaneous 3 times per day  . HYDROmorphone  8 mg Oral BID  . levalbuterol  0.63 mg Nebulization TID  . lisinopril  5 mg Oral Daily  . lubiprostone  24 mcg Oral BID WC  . potassium chloride  40 mEq Oral BID  . predniSONE  50 mg Oral Q breakfast  . sodium chloride  10-40 mL Intracatheter Q12H  . sodium chloride  3 mL Intravenous Q12H  . sodium chloride  3 mL Intravenous Q12H   PE: General appearance: alert, cooperative, no distress and morbidly obese Neck: no carotid bruit and no JVD Lungs: with expiratory wheezing but no rales Heart: regular rate and rhythm, S1, S2 normal, no murmur, click, rub or gallop Extremities: bilateral LEE Pulses: 2+ and symmetric Skin: warm and  dry Neurologic: Grossly normal  Lab Results:   Recent Labs  09/07/14 2245 09/09/14 0506  WBC 6.8 7.2  HGB 9.7* 10.0*  HCT 31.5* 33.6*  PLT 256 260   BMET  Recent Labs  09/07/14 2245 09/08/14 0545 09/09/14 0506  NA 139 139 137  K 2.8* 3.6 4.9  CL 98 99 100  CO2 31 34* 30  GLUCOSE 106* 125* 155*  BUN 19 19 22   CREATININE 1.00 1.09 1.36*  CALCIUM 8.6 8.4 9.0    Assessment/Plan  Active Problems:   Malignant neoplasm of upper-outer quadrant of female breast   Acute on chronic diastolic CHF (congestive heart failure)   Acute respiratory failure   Acute bronchitis   Elevated blood pressure   Lymphedema   Obesity   Essential hypertension   Hypoxia   63 year old female with h/o breast cancer treated with adjuvant chemotherapy consisting of 4 cycles of Taxotere and Cytoxan that presented with progressively worsening LE edema, weight gain and SOB that got significantly worse over the last 3 days. She has also experienced chills and subjective fever.  1. ARF w/ hypoxia: felt to be multifactorial likely URI and acute on chronic diastolic HF. Repeat echo is still pending. However I strongly believe that her SOB is related to her acute bronchitis (severe wheezing) and obesity-hypoventilation syndrome.  Respiratory treatments that I ordered yesterday gave her most relief.  She diuresed  minimal amount of fluid and is not fluid overloaded on physical exam (Other than chronic lymphedema). She now developed acute renal failure, I would hold diuretics. BP is controlled.   Its hard to determine the degree of heart failure, she has chronic lymphedema, that she developed since she was on chemotherapy. Both of Taxotere and Cytoxan are associated with possible CHF infrequently. She finished her chemo in February 2014, her echocardiogram on 03/09/2014 showed preserved LVEF with impaired relaxation and elevated filling pressures. LV dysfunction most commonly occurs 3 months post chemotherapy.   It would be worth to consider referring her to [facility] long physical therapy where they treat lymphedema as an outpatient.  We will follow.  Alexandria [name] 09/09/2014

## 2014-09-09 NOTE — Progress Notes (Signed)
Pt reports having pain in her right knee. Per pt, "I feel like I twisted my right knee. It doesn't feel right anymore." Sood MD notified. New orders for xray placed. Velora Mediate

## 2014-09-09 NOTE — Progress Notes (Signed)
eLink Physician-Brief Progress Note Patient Name: Alexandria [name] DOB: [birth year] MRN: [id number]   Date of Service  09/09/2014  HPI/Events of Note  Pt has rash under breast line area.  She has been on prednisone.  She reports pain in her right knee since walking earlier today and wants xray done.   eICU Interventions  Will order lotrimin cream for rash under breast.  Will order xray of her right knee.      Intervention Category Major Interventions: Other:  Ramisa Duman 09/09/2014, 8:12 PM

## 2014-09-09 NOTE — Progress Notes (Signed)
Name: Alexandria [name] MRN: [id number] DOB: 1951-07-06    ADMISSION DATE:  09/07/2014   CHIEF COMPLAINT:  Hypoxia   BRIEF PATIENT DESCRIPTION: 63 yo morbidly obese female admitted from office 12/21 for Hypoxic Resp Failure with suspected diastolic failure .   SIGNIFICANT EVENTS  12/21 Admit from office   STUDIES:  12/21 VQ scan >> no evidence of acute pulmonary emboli 12/23 TTE> LVEF 55-60% , wall motion normal, Grade 1 diastolic dysfunction, calcified mitral annulus  SUBJECTIVE: feeling better, but having pain, wants to walk   VITAL SIGNS: Temp:  [97.8 F (36.6 C)-98.4 F (36.9 C)] 98 F (36.7 C) (12/23 0946) Pulse Rate:  [68-105] 75 (12/23 0946) Resp:  [17-19] 18 (12/23 0946) BP: (104-148)/(54-87) 148/81 mmHg (12/23 0946) SpO2:  [90 %-96 %] 95 % (12/23 0946) Weight:  [119.9 kg (264 lb 5.3 oz)] 119.9 kg (264 lb 5.3 oz) (12/22 2120)  PHYSICAL EXAMINATION: General:  Obese female in NAD HEENT: NCAT, OP clear PULM: occassional upper airway wheeze which goes away on commands, LUNGS CLEAR CV: RRR, no mgr Ab: BS+, soft, nontender Ext: chronic lymphedema legs, warm, well perfused Neuro: A&Ox4, maew    Recent Labs Lab 09/07/14 2245 09/08/14 0545 09/09/14 0506  NA 139 139 137  K 2.8* 3.6 4.9  CL 98 99 100  CO2 31 34* 30  BUN 19 19 22   CREATININE 1.00 1.09 1.36*  GLUCOSE 106* 125* 155*    Recent Labs Lab 09/07/14 2245 09/09/14 0506  HGB 9.7* 10.0*  HCT 31.5* 33.6*  WBC 6.8 7.2  PLT 256 260   Nm Pulmonary Perf And Vent  09/08/2014   CLINICAL DATA:  A 63 year old with dyspnea, wheezing, cough, leg swelling  EXAM: NUCLEAR MEDICINE VENTILATION - PERFUSION LUNG SCAN  TECHNIQUE: Ventilation images were obtained in multiple projections using inhaled aerosol technetium 99 M DTPA. Perfusion images were obtained in multiple projections after intravenous injection of Tc-56m MAA.  RADIOPHARMACEUTICALS:  40 mCi Tc-68m DTPA aerosol and 6 mCi Tc-28m MAA  COMPARISON:   Correlation with chest radiograph from 09/07/2014  FINDINGS: Ventilation: No focal ventilation defect.  Perfusion: No mismatch wedge shaped peripheral perfusion defects to suggest acute pulmonary embolism.  A matched perfusion/ventilation defect is noted in the left lung base, possibly related to the presence of a large hiatal hernia.  IMPRESSION: No evidence of acute pulmonary emboli.   Electronically Signed   By: Rosemarie Ax   On: 09/08/2014 16:11    ASSESSMENT / PLAN:  Acute Hypoxic Respiratory Failure +/- fluid overload  > resolved Acute Bronchitis  Volume loss due to large hiatal hernia -Supplemental O2 at night and PRN during the day to keep SpO2 > 92% -home O2 eval  -Augmentin 875mg  Twice daily  5 days -Neb As needed   -wean off prednisone over 4 more days  Acute on chronic diastolic HF > resolved - Cards following, f/u their final recs - continue lisinopril, hold lasix - Tele   Chronic pain/Anxiety  - Add back full dose of home meds  Chronic lymphedema to BLE L>R, negative dopplers in past - monitor  From my standpoint she is clearing up well, d/c home 12/24 Her upper airway "wheeze" is transient related to vocal cord irritation from frequent coughing. I educated her on voice rest Add back delsym, don't think it is safe to add anything stronger given high doses of narcotics Add back home pain regimen so she will walk Home O2 eval Appreciate cardiology  Roselie Awkward, MD [place]  [facility] Pager: [phone] Cell: [phone] If no response, call [phone]

## 2014-09-09 NOTE — Progress Notes (Signed)
entered in error   

## 2014-09-09 NOTE — Progress Notes (Signed)
Echocardiogram 2D Echocardiogram has been performed.  Joelene Millin 09/09/2014, 9:45 AM

## 2014-09-10 DIAGNOSIS — E669 Obesity, unspecified: Secondary | ICD-10-CM

## 2014-09-10 DIAGNOSIS — I10 Essential (primary) hypertension: Secondary | ICD-10-CM

## 2014-09-10 DIAGNOSIS — J9600 Acute respiratory failure, unspecified whether with hypoxia or hypercapnia: Secondary | ICD-10-CM

## 2014-09-10 DIAGNOSIS — I890 Lymphedema, not elsewhere classified: Secondary | ICD-10-CM

## 2014-09-10 DIAGNOSIS — I5033 Acute on chronic diastolic (congestive) heart failure: Principal | ICD-10-CM

## 2014-09-10 DIAGNOSIS — R030 Elevated blood-pressure reading, without diagnosis of hypertension: Secondary | ICD-10-CM

## 2014-09-10 LAB — BASIC METABOLIC PANEL
Anion gap: 8 (ref 5–15)
Anion gap: 12 (ref 5–15)
BUN: 24 mg/dL — AB (ref 6–23)
BUN: 24 mg/dL — AB (ref 6–23)
CHLORIDE: 100 meq/L (ref 96–112)
CHLORIDE: 100 meq/L (ref 96–112)
CO2: 30 mmol/L (ref 19–32)
CO2: 26 mmol/L (ref 19–32)
CREATININE: 1.12 mg/dL — AB (ref 0.50–1.10)
Calcium: 8.8 mg/dL (ref 8.4–10.5)
Calcium: 8.5 mg/dL (ref 8.4–10.5)
Creatinine, Ser: 1.09 mg/dL (ref 0.50–1.10)
GFR calc Af Amer: 60 mL/min — ABNORMAL LOW (ref >90)
GFR calc Af Amer: 62 mL/min — ABNORMAL LOW (ref >90)
GFR calc non Af Amer: 52 mL/min — ABNORMAL LOW (ref >90)
GFR calc non Af Amer: 54 mL/min — ABNORMAL LOW (ref >90)
GLUCOSE: 123 mg/dL — AB (ref 70–99)
GLUCOSE: 140 mg/dL — AB (ref 70–99)
POTASSIUM: 4.1 mmol/L (ref 3.5–5.1)
POTASSIUM: 4 mmol/L (ref 3.5–5.1)
Sodium: 138 mmol/L (ref 135–145)
Sodium: 138 mmol/L (ref 135–145)

## 2014-09-10 LAB — GLUCOSE, CAPILLARY: Glucose-Capillary: 100 mg/dL — ABNORMAL HIGH (ref 70–99)

## 2014-09-10 MED ORDER — POTASSIUM CHLORIDE 20 MEQ PO PACK: 40.0000 meq | PACK | Freq: Every day | ORAL | Status: DC

## 2014-09-10 MED ORDER — LISINOPRIL 5 MG PO TABS: 5.0000 mg | ORAL_TABLET | Freq: Every day | ORAL | Status: DC

## 2014-09-10 MED ORDER — DEXTROMETHORPHAN POLISTIREX 30 MG/5ML PO LQCR: 30.0000 mg | Freq: Two times a day (BID) | ORAL | Status: DC

## 2014-09-10 MED ORDER — PREDNISONE 10 MG PO TABS: ORAL_TABLET | ORAL | Status: DC

## 2014-09-10 MED ORDER — AMOXICILLIN-POT CLAVULANATE 875-125 MG PO TABS: 1.0000 | ORAL_TABLET | Freq: Two times a day (BID) | ORAL | Status: DC

## 2014-09-10 MED ORDER — FUROSEMIDE 80 MG PO TABS: 40.0000 mg | ORAL_TABLET | Freq: Every day | ORAL | Status: DC

## 2014-09-10 MED ORDER — CLOTRIMAZOLE 1 % EX CREA: TOPICAL_CREAM | Freq: Two times a day (BID) | CUTANEOUS | Status: DC

## 2014-09-10 MED ORDER — HEPARIN SOD (PORK) LOCK FLUSH 100 UNIT/ML IV SOLN
500.0000 [IU] | INTRAVENOUS | Status: AC | PRN
  Administered 2014-09-10: 500 [IU]

## 2014-09-10 NOTE — Progress Notes (Signed)
Name: Alexandria [name] MRN: [id number] DOB: 1951-09-10    ADMISSION DATE:  09/07/2014   CHIEF COMPLAINT:  Hypoxia   BRIEF PATIENT DESCRIPTION: 63 yo morbidly obese female admitted from office 12/21 for Hypoxic Resp Failure with suspected diastolic failure .   SIGNIFICANT EVENTS  12/21 Admit from office   STUDIES:  12/21 VQ scan >> no evidence of acute pulmonary emboli 12/23 TTE> LVEF 55-60% , wall motion normal, Grade 1 diastolic dysfunction, calcified mitral annulus  SUBJECTIVE: feeling better, but having pain, wants to walk   VITAL SIGNS: Temp:  [97.5 F (36.4 C)-99.4 F (37.4 C)] 97.8 F (36.6 C) (12/24 0517) Pulse Rate:  [77-92] 77 (12/24 0517) Resp:  [18-20] 20 (12/24 0517) BP: (115-138)/(59-77) 138/73 mmHg (12/24 0517) SpO2:  [91 %-100 %] 93 % (12/24 0518)  PHYSICAL EXAMINATION: General:  Obese female in NAD, "I feel better" HEENT: NCAT, OP clear PULM: CTA CV: RRR, no mgr Ab: BS+, soft, nontender Ext: chronic lymphedema legs, warm, well perfused Neuro: A&Ox4, maew    Recent Labs Lab 09/07/14 2245 09/08/14 0545 09/09/14 0506  NA 139 139 137  K 2.8* 3.6 4.9  CL 98 99 100  CO2 31 34* 30  BUN 19 19 22   CREATININE 1.00 1.09 1.36*  GLUCOSE 106* 125* 155*    Recent Labs Lab 09/07/14 2245 09/09/14 0506  HGB 9.7* 10.0*  HCT 31.5* 33.6*  WBC 6.8 7.2  PLT 256 260   Dg Knee 1-2 Views Right  09/10/2014   CLINICAL DATA:  Anterior right knee pain  EXAM: RIGHT KNEE - 1-2 VIEW  COMPARISON:  None.  FINDINGS: There is no evidence of fracture, dislocation, or joint effusion. There is no evidence of arthropathy or other focal bone abnormality. Soft tissues are unremarkable. Mild-to-moderate tricompartmental degenerative change noted. No suprapatellar effusion.  IMPRESSION: No acute abnormality.   Electronically Signed   By: Conchita Paris M.D.   On: 09/10/2014 01:02   Nm Pulmonary Perf And Vent  09/08/2014   CLINICAL DATA:  A 63 year old with dyspnea, wheezing,  cough, leg swelling  EXAM: NUCLEAR MEDICINE VENTILATION - PERFUSION LUNG SCAN  TECHNIQUE: Ventilation images were obtained in multiple projections using inhaled aerosol technetium 99 M DTPA. Perfusion images were obtained in multiple projections after intravenous injection of Tc-69m MAA.  RADIOPHARMACEUTICALS:  40 mCi Tc-74m DTPA aerosol and 6 mCi Tc-6m MAA  COMPARISON:  Correlation with chest radiograph from 09/07/2014  FINDINGS: Ventilation: No focal ventilation defect.  Perfusion: No mismatch wedge shaped peripheral perfusion defects to suggest acute pulmonary embolism.  A matched perfusion/ventilation defect is noted in the left lung base, possibly related to the presence of a large hiatal hernia.  IMPRESSION: No evidence of acute pulmonary emboli.   Electronically Signed   By: Rosemarie Ax   On: 09/08/2014 16:11    ASSESSMENT / PLAN:  Acute Hypoxic Respiratory Failure +/- fluid overload  > resolved Acute Bronchitis  Volume loss due to large hiatal hernia -Supplemental O2 at night and PRN during the day to keep SpO2 > 92% -home O2 eval , placed on home O2 nocturnal and prn. Desat to 82% with ambulation -Augmentin 875mg  Twice daily  5 days -Neb As needed   -wean off prednisone over 3 more days  Acute on chronic diastolic HF > resolved - Cards following, f/u their final recs - continue lisinopril, hold lasix(have asked cards to address resuming lasix. - Tele   Chronic pain/Anxiety  - Add back full dose of home meds  Chronic lymphedema to BLE L>R, negative dopplers in past - monitor  She is clearing up well, d/c home 12/24 after home O2 and cards input. Her upper airway "wheeze" is transient related to vocal cord irritation from frequent coughing. I educated her on voice rest Add back delsym, don't think it is safe to add anything stronger given high doses of narcotics. She asked for hydrocodone 12/24 but delsym for now. She can ask Dr. Gwenette Greet for further input. Add back home pain  regimen so she will walk Home O2 eval , has nocturnal O2 and desats to 82% on room air walks therefore O2 prn and nocturnal. Appreciate cardiology DC home today once all issues are addressed.  Richardson Landry Minor ACNP [facility] Pager [phone] till 3 pm If no answer page [phone] 09/10/2014, 9:56 AM   Agree with above  Merton Border, MD ; [facility] [phone].  After 5:30 PM or weekends, call [phone]

## 2014-09-10 NOTE — Discharge Instructions (Signed)
Please note your lasix and potassium doses have been reduced please note. You have a follow up  visit with Tammy Parrett ANP, you will need lab work to check creatine soon.

## 2014-09-10 NOTE — Progress Notes (Addendum)
Patient Profile: 63 year old female with h/o breast cancer treated with adjuvant chemotherapy consisting of 4 cycles of Taxotere and Cytoxan that presented with progressively worsening LE edema, weight gain and SOB that got significantly worse over the last 3 days. She has also experienced chills and subjective fever.  Subjective: Walked the halls yesterday and then put out ~3 L of urine. Feeling so much better in terms of SOB.   Objective: Vital signs in last 24 hours: Temp:  [97.5 F (36.4 C)-99.4 F (37.4 C)] 97.8 F (36.6 C) (12/24 0517) Pulse Rate:  [75-92] 77 (12/24 0517) Resp:  [18-20] 20 (12/24 0517) BP: (115-148)/(59-81) 138/73 mmHg (12/24 0517) SpO2:  [91 %-100 %] 93 % (12/24 0518) Last BM Date: 09/08/14  Intake/Output from previous day: 12/23 0701 - 12/24 0700 In: 240 [P.O.:240] Out: 3000 [Urine:3000] Intake/Output this shift:    Medications . amoxicillin-clavulanate  1 tablet Oral Q12H  . anastrozole  1 mg Oral Daily  . atorvastatin  20 mg Oral q1800  . clotrimazole   Topical BID  . dextromethorphan  30 mg Oral BID  . docusate sodium  100 mg Oral BID  . escitalopram  20 mg Oral Daily  . ferrous sulfate  325 mg Oral Q breakfast  . heparin  5,000 Units Subcutaneous 3 times per day  . HYDROmorphone HCl  16 mg Oral Daily  . levalbuterol  0.63 mg Nebulization TID  . lisinopril  5 mg Oral Daily  . lubiprostone  24 mcg Oral BID WC  . potassium chloride  40 mEq Oral BID  . predniSONE  30 mg Oral Q breakfast  . sodium chloride  10-40 mL Intracatheter Q12H  . sodium chloride  3 mL Intravenous Q12H  . sodium chloride  3 mL Intravenous Q12H   PE: General appearance: alert, cooperative, no distress and morbidly obese Neck: no carotid bruit and no JVD Lungs: with expiratory wheezing but no rales Heart: regular rate and rhythm, S1, S2 normal, no murmur, click, rub or gallop Extremities: bilateral LEE Pulses: 2+ and symmetric Skin: warm and dry Neurologic: Grossly  normal  Lab Results:   Recent Labs  09/07/14 2245 09/09/14 0506  WBC 6.8 7.2  HGB 9.7* 10.0*  HCT 31.5* 33.6*  PLT 256 260   BMET  Recent Labs  09/07/14 2245 09/08/14 0545 09/09/14 0506  NA 139 139 137  K 2.8* 3.6 4.9  CL 98 99 100  CO2 31 34* 30  GLUCOSE 106* 125* 155*  BUN 19 19 22   CREATININE 1.00 1.09 1.36*  CALCIUM 8.6 8.4 9.0    2D ECHO: 09/09/2014 LV EF: 55% -  60% Study Conclusions - Left ventricle: The cavity size was normal. Wall thickness was normal. Systolic function was normal. The estimated ejection fraction was in the range of 55% to 60%. Wall motion was normal; there were no regional wall motion abnormalities. Doppler parameters are consistent with abnormal left ventricular relaxation (grade 1 diastolic dysfunction). Doppler parameters are consistent with high ventricular filling pressure. - Mitral valve: Calcified annulus. Impressions: - Normal LV function; grade 1 diastolic dysfunction; elevated left ventricular filling pressure.   Assessment/Plan  Active Problems:   Malignant neoplasm of upper-outer quadrant of female breast   Acute on chronic diastolic CHF (congestive heart failure)   Acute respiratory failure   Acute bronchitis   Elevated blood pressure   Lymphedema   Obesity   Essential hypertension   Hypoxia   Cough   63 year old female with  h/o breast cancer treated with adjuvant chemotherapy consisting of 4 cycles of Taxotere and Cytoxan that presented with progressively worsening LE edema, weight gain and SOB that got significantly worse over the last 3 days. She has also experienced chills and subjective fever.  1. ARF w/ hypoxia: felt to be multifactorial likely URI and acute on chronic diastolic HF. However I strongly believe that her SOB is related to her acute bronchitis (severe wheezing) and obesity-hypoventilation syndrome.  Respiratory treatments ordered gave her most relief.  She diuresed minimal amount  of fluid and is not fluid overloaded on physical exam (Other than chronic lymphedema). She now developed acute renal failure, so diuretics held. BP is controlled.   Its hard to determine the degree of heart failure, she has chronic lymphedema, that she developed since she was on chemotherapy. Both of Taxotere and Cytoxan are associated with possible CHF infrequently. She finished her chemo in February 2014, her echocardiogram on 03/09/2014 showed preserved LVEF with impaired relaxation and elevated filling pressures. LV dysfunction most commonly occurs 3 months post chemotherapy.  It would be worth to consider referring her to [facility] long physical therapy where they treat lymphedema as an outpatient. -- 2D ECHO 09/09/14 with Impressions normal LV function; G1DD and elevated left ventricular filling pressure. -- She walked the halls yesterday and then put out 2.8L of fluid. She is feeling much better in terms of SOB  -- Creat ~1.3 yesterday. BMET pending today. She may need some more diuresis. MD to see.     Eileen Stanford 09/10/2014   The patient was seen, examined and discussed with Nell Range, PA-C and I agree with the above.   Echo shows normal LVEF, impaired relaxation. I would discharge her on lasix 40 mg po daily. I would be happy follow her in the clinic, we will repeat her labs at that time.   Dorothy Spark 09/10/2014

## 2014-09-10 NOTE — Discharge Summary (Signed)
Physician Discharge Summary  Patient ID: Alexandria [name] MRN: [id number] DOB/AGE: 12-25-1950 63 y.o.  Admit date: 09/07/2014 Discharge date: 09/10/2014  Problem List Active Problems:   Malignant neoplasm of upper-outer quadrant of female breast   Acute on chronic diastolic CHF (congestive heart failure)   Acute respiratory failure   Acute bronchitis   Elevated blood pressure   Lymphedema   Obesity   Essential hypertension   Hypoxia   Cough  HPI:  63 yo obese female never smoker seen for pulmonary consult in May 2015 for dyspnea and mediastinal adenopathy. Presented to office 09/07/14 for an acute office visit. . Complains of worsening dyspnea, wheezing, cough , leg swelling over last 2 months.  On arrival to the office she has O2 sat 82% on RA .  Had recent ONO started on O2 At bedtime .  Placed on O2 at 2l/m with sats 94%. CXR showed no acute process.  Says that her DOE has been progressive, over last year.  Previous CT chest showed mild mediastinal lymphadenopathy and was repeated in 06/2014 that showed stable mediastinal lymph nodes. CT of her chest that shows at least 50% of her stomach in her left chest, with significant compromise of left lung volume.  Hx breast cancer diagnosed in January 2014 and underwent right lumpectomy and radiation therapy that finished in July of 2014 . She also has a history of a pulmonary embolus in the distant past, but had a ventilation perfusion scan 2014 that was unremarkable.  She has had chronic lower extremity edema, especially on the left since receiving chemotherapy. She says she has had venous dopplers that were neg.  The patient's weight has increased 120 pounds over the last one to 2 years. Last admission 07/14/14 for ventral incision hernia repair.  Denies chest pain, orthopnea, fever, hemoptysis , n/v/d.  Previous echo 02/2014 showed EF 55%, grade 1 DD.  Says she has been monitoring her o2 at home since Oct and has noticed  that her O2 has been dropping in the 80's on room air.  Hospital Course:   SIGNIFICANT EVENTS  12/21 Admit from office   STUDIES:  12/21 VQ scan >> no evidence of acute pulmonary emboli 12/23 TTE> LVEF 55-60% , wall motion normal, Grade 1 diastolic dysfunction, calcified mitral annulus   ASSESSMENT / PLAN:  Acute Hypoxic Respiratory Failure +/- fluid overload > resolved Acute Bronchitis  Volume loss due to large hiatal hernia -Supplemental O2 at night and PRN during the day to keep SpO2 > 92% -home O2 eval , placed on home O2 nocturnal and prn. Desat to 82% with ambulation -Augmentin 875mg  Twice daily 5 days -Neb As needed  -wean off prednisone over 3 more days  Acute on chronic diastolic HF > resolved - Cards following, f/u their final recs - continue lisinopril, restart lasix at 40 mg daily. - Tele   Chronic pain/Anxiety  - Add back full dose of home meds  Chronic lymphedema to BLE L>R, negative dopplers in past - monitor  She is clearing up well, d/c home 12/24 after home O2 and cards input. Her upper airway "wheeze" is transient related to vocal cord irritation from frequent coughing. I educated her on voice rest Add back delsym, don't think it is safe to add anything stronger given high doses of narcotics. She asked for hydrocodone 12/24 but delsym for now. She can ask Dr. Gwenette Greet for further input. Add back home pain regimen so she will walk Home O2 eval , has nocturnal  O2 and desats to 82% on room air walks therefore O2 prn and nocturnal. Appreciate cardiology DC home today once all issues are addressed.   Labs at discharge Lab Results  Component Value Date   CREATININE 1.09 09-25-14   BUN 24* Sep 25, 2014   NA 138 Sep 25, 2014   K 4.0 September 25, 2014   CL 100 September 25, 2014   CO2 26 09/25/2014   Lab Results  Component Value Date   WBC 7.2 09/09/2014   HGB 10.0* 09/09/2014   HCT 33.6* 09/09/2014   MCV 79.2 09/09/2014   PLT 260 09/09/2014   Lab Results   Component Value Date   ALT 29 09/07/2014   AST 29 09/07/2014   ALKPHOS 188* 09/07/2014   BILITOT 0.3 09/07/2014   No results found for: INR, PROTIME  Current radiology studies Dg Knee 1-2 Views Right  25-Sep-2014   CLINICAL DATA:  Anterior right knee pain  EXAM: RIGHT KNEE - 1-2 VIEW  COMPARISON:  None.  FINDINGS: There is no evidence of fracture, dislocation, or joint effusion. There is no evidence of arthropathy or other focal bone abnormality. Soft tissues are unremarkable. Mild-to-moderate tricompartmental degenerative change noted. No suprapatellar effusion.  IMPRESSION: No acute abnormality.   Electronically Signed   By: Conchita Paris M.D.   On: Sep 25, 2014 01:02   Nm Pulmonary Perf And Vent  09/08/2014   CLINICAL DATA:  A 63 year old with dyspnea, wheezing, cough, leg swelling  EXAM: NUCLEAR MEDICINE VENTILATION - PERFUSION LUNG SCAN  TECHNIQUE: Ventilation images were obtained in multiple projections using inhaled aerosol technetium 99 M DTPA. Perfusion images were obtained in multiple projections after intravenous injection of Tc-14m MAA.  RADIOPHARMACEUTICALS:  40 mCi Tc-89m DTPA aerosol and 6 mCi Tc-49m MAA  COMPARISON:  Correlation with chest radiograph from 09/07/2014  FINDINGS: Ventilation: No focal ventilation defect.  Perfusion: No mismatch wedge shaped peripheral perfusion defects to suggest acute pulmonary embolism.  A matched perfusion/ventilation defect is noted in the left lung base, possibly related to the presence of a large hiatal hernia.  IMPRESSION: No evidence of acute pulmonary emboli.   Electronically Signed   By: Rosemarie Ax   On: 09/08/2014 16:11    Disposition:  06-Home-Health Care Svc     Medication List    STOP taking these medications        HYDROcodone-homatropine 5-1.5 MG/5ML syrup  Commonly known as:  HYCODAN     naproxen sodium 220 MG tablet  Commonly known as:  ANAPROX      TAKE these medications        albuterol 108 (90 BASE) MCG/ACT  inhaler  Commonly known as:  PROVENTIL HFA;VENTOLIN HFA  Inhale 2 puffs into the lungs every 6 (six) hours as needed for wheezing or shortness of breath (wheezing).     amoxicillin-clavulanate 875-125 MG per tablet  Commonly known as:  AUGMENTIN  Take 1 tablet by mouth every 12 (twelve) hours.     anastrozole 1 MG tablet  Commonly known as:  ARIMIDEX  Take 1 mg by mouth daily.     atorvastatin 20 MG tablet  Commonly known as:  LIPITOR  Take 1 tablet (20 mg total) by mouth daily.     clotrimazole 1 % cream  Commonly known as:  LOTRIMIN  Apply topically 2 (two) times daily.     dextromethorphan 30 MG/5ML liquid  Commonly known as:  DELSYM  Take 5 mLs (30 mg total) by mouth 2 (two) times daily.     DSS 100 MG Caps  Take 100 mg by mouth 2 (two) times daily.     escitalopram 20 MG tablet  Commonly known as:  LEXAPRO  Take 1 tablet (20 mg total) by mouth daily.     ferrous sulfate 325 (65 FE) MG tablet  Take 325 mg by mouth daily with breakfast.     furosemide 80 MG tablet  Commonly known as:  LASIX  Take 0.5 tablets (40 mg total) by mouth daily.     HAIR/SKIN/NAILS PO  Take 1 tablet by mouth every morning.     HYDROmorphone 4 MG tablet  Commonly known as:  DILAUDID  Take 1 tablet (4 mg total) by mouth every 6 (six) hours as needed for severe pain (pain).     HYDROmorphone HCl 16 MG T24a  Take 32 mg by mouth daily.     levalbuterol 0.63 MG/3ML nebulizer solution  Commonly known as:  XOPENEX  Take 0.63 mg by nebulization every 4 (four) hours as needed for wheezing or shortness of breath (shortness of breath).     lidocaine 5 %  Commonly known as:  LIDODERM  Place 1-2 patches onto the skin daily. Remove & Discard patch within 12 hours or as directed by MD     lidocaine-prilocaine cream  Commonly known as:  EMLA  Apply 1 application topically as needed (port access).     lisinopril 5 MG tablet  Commonly known as:  PRINIVIL,ZESTRIL  Take 1 tablet (5 mg total) by  mouth daily.     LORazepam 0.5 MG tablet  Commonly known as:  ATIVAN  TAKE 1 TABLET BY MOUTH TWICE DAILY     lubiprostone 24 MCG capsule  Commonly known as:  AMITIZA  Take 1 capsule (24 mcg total) by mouth 2 (two) times daily with a meal.     potassium chloride 20 MEQ packet  Commonly known as:  KLOR-CON  Take 40 mEq by mouth daily.     predniSONE 10 MG tablet  Commonly known as:  DELTASONE  Take  3 tabs daily x 4 days, then 2 tabs daily x 4 days, then 1 tab daily x4 days then stop. #26          Discharged Condition: fair  Time spent on discharge greater than 40 minutes.  Vital signs at Discharge. Temp:  [97.5 F (36.4 C)-99.4 F (37.4 C)] 98.6 F (37 C) (12/24 1000) Pulse Rate:  [64-92] 64 (12/24 1000) Resp:  [18-20] 20 (12/24 1000) BP: (115-138)/(59-77) 129/64 mmHg (12/24 1000) SpO2:  [91 %-100 %] 95 % (12/24 1000) Office follow up Special Information or instructions. Follow up with Tammy Parrett as scheduled , she will need a bmet for creatine.  Signed: Richardson Landry Minor ACNP [place] Pager [phone] till 3 pm If no answer page [phone] 09/10/2014, 1:29 PM   Merton Border, MD ; [facility] service Mobile [phone].  After 5:30 PM or weekends, call [phone]

## 2014-09-14 ENCOUNTER — Telehealth: Payer: Self-pay | Admitting: Cardiology

## 2014-09-14 NOTE — Telephone Encounter (Signed)
Closed encounter °

## 2014-09-15 ENCOUNTER — Telehealth: Payer: Self-pay | Admitting: Pulmonary Disease

## 2014-09-15 ENCOUNTER — Telehealth: Payer: Self-pay | Admitting: Internal Medicine

## 2014-09-15 NOTE — Telephone Encounter (Signed)
Will forward to KC as FYI 

## 2014-09-15 NOTE — Telephone Encounter (Signed)
(  f) [phone]  Clarise Cruz at [facility] states patient is in the office and they need a letter faxed to them giving the okay for patient to receive dental work.  I asked if they could send a letter for Dr. Raliegh Ip to check and sign, but was told to have him send that letter giving the "ok".  They are also requesting it done now since the patient is there in the office.  I advised her that Dr. Raliegh Ip is seeing patient's and I don't know how soon it could be done.

## 2014-09-16 DIAGNOSIS — R0789 Other chest pain: Secondary | ICD-10-CM | POA: Diagnosis not present

## 2014-09-16 DIAGNOSIS — G5791 Unspecified mononeuropathy of right lower limb: Secondary | ICD-10-CM | POA: Diagnosis not present

## 2014-09-16 DIAGNOSIS — M1710 Unilateral primary osteoarthritis, unspecified knee: Secondary | ICD-10-CM | POA: Diagnosis not present

## 2014-09-16 DIAGNOSIS — Z79891 Long term (current) use of opiate analgesic: Secondary | ICD-10-CM | POA: Diagnosis not present

## 2014-09-16 DIAGNOSIS — M1990 Unspecified osteoarthritis, unspecified site: Secondary | ICD-10-CM | POA: Diagnosis not present

## 2014-09-16 DIAGNOSIS — G894 Chronic pain syndrome: Secondary | ICD-10-CM | POA: Diagnosis not present

## 2014-09-16 DIAGNOSIS — G5792 Unspecified mononeuropathy of left lower limb: Secondary | ICD-10-CM | POA: Diagnosis not present

## 2014-09-16 DIAGNOSIS — M5417 Radiculopathy, lumbosacral region: Secondary | ICD-10-CM | POA: Diagnosis not present

## 2014-09-16 DIAGNOSIS — M5137 Other intervertebral disc degeneration, lumbosacral region: Secondary | ICD-10-CM | POA: Diagnosis not present

## 2014-09-16 DIAGNOSIS — Z79899 Other long term (current) drug therapy: Secondary | ICD-10-CM | POA: Diagnosis not present

## 2014-09-16 NOTE — Telephone Encounter (Signed)
Yes pt still needs a letter. Called and spoke with Clarise Cruz at [facility] and she states per doctor pt just needs permission to have dental treatment done. Pt just got out the hospital and has a lot of medical problems currently. Letter can be faxed to:  [phone].

## 2014-09-16 NOTE — Telephone Encounter (Signed)
Is this letter still needed?   I am just now receiving this message

## 2014-09-17 DIAGNOSIS — M7751 Other enthesopathy of right foot: Secondary | ICD-10-CM | POA: Diagnosis not present

## 2014-09-17 DIAGNOSIS — M7752 Other enthesopathy of left foot: Secondary | ICD-10-CM | POA: Diagnosis not present

## 2014-09-17 DIAGNOSIS — M722 Plantar fascial fibromatosis: Secondary | ICD-10-CM | POA: Diagnosis not present

## 2014-09-17 NOTE — Telephone Encounter (Signed)
I have not seen patient since her hospital discharge.  I will need to assess clinically before granting clearance for dental procedures other than minor and routine procedures

## 2014-09-17 NOTE — Telephone Encounter (Signed)
Left a message for pt to return call 

## 2014-09-22 NOTE — Telephone Encounter (Signed)
Spoke to Alexandria [name], told her needs to be seen per Dr. Raliegh Ip for letter for dental procedure. Told Alexandria [name] she is already scheduled on the 1/18 can do it then. Alexandria [name] verbalized understanding.

## 2014-09-24 ENCOUNTER — Telehealth: Payer: Self-pay | Admitting: Hematology and Oncology

## 2014-09-24 ENCOUNTER — Other Ambulatory Visit: Payer: Self-pay | Admitting: Hematology and Oncology

## 2014-09-24 NOTE — Telephone Encounter (Signed)
returned call and r/s appt per pt request...ok with MD

## 2014-09-25 ENCOUNTER — Ambulatory Visit (INDEPENDENT_AMBULATORY_CARE_PROVIDER_SITE_OTHER): Payer: Medicare Other | Admitting: Adult Health

## 2014-09-25 ENCOUNTER — Ambulatory Visit (HOSPITAL_BASED_OUTPATIENT_CLINIC_OR_DEPARTMENT_OTHER): Payer: Managed Care, Other (non HMO) | Admitting: Hematology and Oncology

## 2014-09-25 ENCOUNTER — Telehealth: Payer: Self-pay | Admitting: Hematology and Oncology

## 2014-09-25 ENCOUNTER — Other Ambulatory Visit (HOSPITAL_BASED_OUTPATIENT_CLINIC_OR_DEPARTMENT_OTHER): Payer: Managed Care, Other (non HMO)

## 2014-09-25 ENCOUNTER — Encounter: Payer: Self-pay | Admitting: Hematology and Oncology

## 2014-09-25 ENCOUNTER — Encounter: Payer: Self-pay | Admitting: Adult Health

## 2014-09-25 ENCOUNTER — Ambulatory Visit
Admission: RE | Admit: 2014-09-25 | Discharge: 2014-09-25 | Disposition: A | Discharge status: Home / Self Care | Payer: Managed Care, Other (non HMO) | Source: Ambulatory Visit | Attending: Radiation Oncology | Admitting: Radiation Oncology

## 2014-09-25 VITALS — BP 129/78 | HR 110 | Temp 98.0°F | Resp 18 | Ht 68.0 in | Wt 255.8 lb

## 2014-09-25 VITALS — BP 144/74 | HR 114 | Temp 98.0°F | Ht 68.0 in | Wt 255.0 lb

## 2014-09-25 VITALS — BP 129/78 | HR 110 | Temp 98.0°F | Resp 20 | Wt 255.8 lb

## 2014-09-25 DIAGNOSIS — J209 Acute bronchitis, unspecified: Secondary | ICD-10-CM | POA: Diagnosis not present

## 2014-09-25 DIAGNOSIS — R600 Localized edema: Secondary | ICD-10-CM

## 2014-09-25 DIAGNOSIS — C50419 Malignant neoplasm of upper-outer quadrant of unspecified female breast: Secondary | ICD-10-CM

## 2014-09-25 DIAGNOSIS — R109 Unspecified abdominal pain: Secondary | ICD-10-CM

## 2014-09-25 DIAGNOSIS — J383 Other diseases of vocal cords: Secondary | ICD-10-CM | POA: Diagnosis not present

## 2014-09-25 DIAGNOSIS — D509 Iron deficiency anemia, unspecified: Secondary | ICD-10-CM

## 2014-09-25 DIAGNOSIS — R5383 Other fatigue: Secondary | ICD-10-CM

## 2014-09-25 DIAGNOSIS — I5033 Acute on chronic diastolic (congestive) heart failure: Secondary | ICD-10-CM

## 2014-09-25 DIAGNOSIS — R911 Solitary pulmonary nodule: Secondary | ICD-10-CM

## 2014-09-25 DIAGNOSIS — R0609 Other forms of dyspnea: Secondary | ICD-10-CM | POA: Diagnosis not present

## 2014-09-25 DIAGNOSIS — C50411 Malignant neoplasm of upper-outer quadrant of right female breast: Secondary | ICD-10-CM

## 2014-09-25 DIAGNOSIS — J9621 Acute and chronic respiratory failure with hypoxia: Secondary | ICD-10-CM

## 2014-09-25 DIAGNOSIS — D649 Anemia, unspecified: Secondary | ICD-10-CM

## 2014-09-25 DIAGNOSIS — D500 Iron deficiency anemia secondary to blood loss (chronic): Secondary | ICD-10-CM

## 2014-09-25 LAB — CBC & DIFF AND RETIC
BASO%: 0.1 % (ref 0.0–2.0)
BASOS ABS: 0 10*3/uL (ref 0.0–0.1)
EOS ABS: 0.2 10*3/uL (ref 0.0–0.5)
EOS%: 2.2 % (ref 0.0–7.0)
HCT: 33.9 % — ABNORMAL LOW (ref 34.8–46.6)
HGB: 10.5 g/dL — ABNORMAL LOW (ref 11.6–15.9)
Immature Retic Fract: 12.5 % — ABNORMAL HIGH (ref 1.60–10.00)
LYMPH%: 17.5 % (ref 14.0–49.7)
MCH: 24.1 pg — AB (ref 25.1–34.0)
MCHC: 31 g/dL — ABNORMAL LOW (ref 31.5–36.0)
MCV: 77.9 fL — ABNORMAL LOW (ref 79.5–101.0)
MONO#: 0.6 10*3/uL (ref 0.1–0.9)
MONO%: 8.8 % (ref 0.0–14.0)
NEUT#: 5.2 10*3/uL (ref 1.5–6.5)
NEUT%: 71.4 % (ref 38.4–76.8)
PLATELETS: 222 10*3/uL (ref 145–400)
RBC: 4.35 10*6/uL (ref 3.70–5.45)
RDW: 18.4 % — ABNORMAL HIGH (ref 11.2–14.5)
RETIC %: 1.31 % (ref 0.70–2.10)
Retic Ct Abs: 56.99 10*3/uL (ref 33.70–90.70)
WBC: 7.3 10*3/uL (ref 3.9–10.3)
lymph#: 1.3 10*3/uL (ref 0.9–3.3)

## 2014-09-25 LAB — FERRITIN CHCC: Ferritin: 16 ng/ml (ref 9–269)

## 2014-09-25 NOTE — Progress Notes (Signed)
Radiation Oncology         [phone] ________________________________  Name: DELORIA BRASSFIELD MRN: [id number]  Date: 09/25/2014  DOB: [birth year]  Follow-Up Visit Note  Outpatient  CC: Nyoka Cowden, MD  Servando Salina, MD  Diagnosis and Prior Radiotherapy:    ICD-9-CM ICD-10-CM   1. 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  Due to multiple medical issues, she has missed previous follow-ups scheduled for post radiation assessment.   Patient saw Dr Alvy Bimler today, was told her "iron is low". She will receive 2 infusions next week. She may need to delay her hiatal hernia surgery.  She reports fatigue, loss of appetite and soreness inside her right breast. She states she has been exercising and using her arms, so she feels her soreness may be due to this new activity. It's not affected her QOL in any significant way, she reports.    She has hoarseness today with cough. She states she saw her "lung Dr today", and that she "has a frog in her throat".  She is on anastrazole.                               ALLERGIES:  is allergic to contrast media; albuterol; and prednisone.  Meds: Current Outpatient Prescriptions  Medication Sig Dispense Refill  . albuterol (PROVENTIL HFA;VENTOLIN HFA) 108 (90 BASE) MCG/ACT inhaler Inhale 2 puffs into the lungs every 6 (six) hours as needed for wheezing or shortness of breath (wheezing). 1 Inhaler 5  . anastrozole (ARIMIDEX) 1 MG tablet Take 1 mg by mouth daily.    Marland Kitchen atorvastatin (LIPITOR) 20 MG tablet Take 1 tablet (20 mg total) by mouth daily. 90  tablet 3  . clotrimazole (LOTRIMIN) 1 % cream Apply topically 2 (two) times daily. 30 g 0  . dextromethorphan (DELSYM) 30 MG/5ML liquid Take 5 mLs (30 mg total) by mouth 2 (two) times daily. 89 mL 0  . docusate sodium 100 MG CAPS Take 100 mg by mouth 2 (two) times daily. 10 capsule 0  . escitalopram (LEXAPRO) 20 MG tablet Take 1 tablet (20 mg total) by mouth daily. 90 tablet 3  . ferrous sulfate 325 (65 FE) MG tablet Take 325 mg by mouth daily with breakfast.    . furosemide (LASIX) 80 MG tablet Take 0.5 tablets (40 mg total) by mouth daily.    Marland Kitchen HYDROmorphone (DILAUDID) 4 MG tablet Take 1 tablet (4 mg total) by mouth every 6 (six) hours as needed for severe pain (pain). 40 tablet 0  . HYDROmorphone HCl 16 MG T24A Take 32 mg by mouth daily.   0  . levalbuterol (XOPENEX) 0.63 MG/3ML nebulizer solution Take 0.63 mg by nebulization every 4 (four) hours as needed for wheezing or shortness of breath (shortness of breath).    . lidocaine (LIDODERM) 5 % Place 1-2 patches onto the skin daily. Remove & Discard patch within 12 hours or as directed by MD    . lidocaine-prilocaine (EMLA) cream Apply 1 application  topically as needed (port access).     Marland Kitchen lisinopril (PRINIVIL,ZESTRIL) 5 MG tablet Take 1 tablet (5 mg total) by mouth daily. 30 tablet 1  . LORazepam (ATIVAN) 0.5 MG tablet TAKE 1 TABLET BY MOUTH TWICE DAILY 60 tablet 2  . lubiprostone (AMITIZA) 24 MCG capsule Take 1 capsule (24 mcg total) by mouth 2 (two) times daily with a meal. 60 capsule 0  . Multiple Vitamins-Minerals (HAIR/SKIN/NAILS PO) Take 1 tablet by mouth every morning.    . potassium chloride (KLOR-CON) 20 MEQ packet Take 40 mEq by mouth daily.     No current facility-administered medications for this encounter.    Physical Findings:  Patient is alert and oriented.  weight is 255 lb 12.8 oz (116.03 kg). Her oral temperature is 98 F (36.7 C). Her blood pressure is 129/78 and her pulse is 110. Her respiration is 20. Marland Kitchen    NECK: No  cervical or supraclavicular lymphadenopathy BREASTS: No lesions palpated in breasts or axillary regions bilaterally. Right breast smaller than left. No skin changes of concern. +LE edema b/l  Lab Findings: Lab Results  Component Value Date   WBC 7.3 09/25/2014   HGB 10.5* 09/25/2014   HCT 33.9* 09/25/2014   MCV 77.9* 09/25/2014   PLT 222 09/25/2014    Radiographic Findings: BIRADS 2 MAMMOGRAM IN MARCH 2015  Dg Chest 2 View  09/07/2014   CLINICAL DATA:  Wheezing. Short of breath. Cough. Personal history of pulmonary embolism.  EXAM: CHEST  2 VIEW  COMPARISON:  07/05/2014 and multiple previous  FINDINGS: Heart size is normal. Large hiatal hernia again noted. Catheter projecting over the left chest with the tip at the level of the azygos vein again noted. Mild volume loss at the left base related to the hiatal hernia. No effusions. No active process evident.  IMPRESSION: Large hiatal hernia. Mild volume loss at the left base secondary to that. No active process otherwise.   Electronically Signed   By: Nelson Chimes M.D.   On: 09/07/2014 11:45   Dg Knee 1-2 Views Right  09/10/2014   CLINICAL DATA:  Anterior right knee pain  EXAM: RIGHT KNEE - 1-2 VIEW  COMPARISON:  None.  FINDINGS: There is no evidence of fracture, dislocation, or joint effusion. There is no evidence of arthropathy or other focal bone abnormality. Soft tissues are unremarkable. Mild-to-moderate tricompartmental degenerative change noted. No suprapatellar effusion.  IMPRESSION: No acute abnormality.   Electronically Signed   By: Conchita Paris M.D.   On: 09/10/2014 01:02   Nm Pulmonary Perf And Vent  09/08/2014   CLINICAL DATA:  A 64 year old with dyspnea, wheezing, cough, leg swelling  EXAM: NUCLEAR MEDICINE VENTILATION - PERFUSION LUNG SCAN  TECHNIQUE: Ventilation images were obtained in multiple projections using inhaled aerosol technetium 99 M DTPA. Perfusion images were obtained in multiple projections after intravenous  injection of Tc-77mMAA.  RADIOPHARMACEUTICALS:  40 mCi Tc-920mTPA aerosol and 6 mCi Tc-998mA  COMPARISON:  Correlation with chest radiograph from 09/07/2014  FINDINGS: Ventilation: No focal ventilation defect.  Perfusion: No mismatch wedge shaped peripheral perfusion defects to suggest acute pulmonary embolism.  A matched perfusion/ventilation defect is noted in the left lung base, possibly related to the presence of a large hiatal hernia.  IMPRESSION: No evidence of acute pulmonary emboli.   Electronically Signed   By: KenRosemarie AxOn: 09/08/2014 16:11    Impression/Plan: NED.  Her breast pain is likely benign and reassurance was given.  She is  due for mammography in March.    I encouraged her to continue with yearly mammography and followup with medical oncology. I will see her back on an as-needed basis. I have encouraged her to call if she has any issues or concerns in the future. I wished her the very best.   _____________________________________   Eppie Gibson, MD

## 2014-09-25 NOTE — Assessment & Plan Note (Signed)
The most likely cause of her anemia is due to chronic blood loss/malabsorption syndrome. We discussed some of the risks, benefits, and alternatives of intravenous iron infusions. The patient is symptomatic from anemia and the iron level is critically low. She tolerated oral iron supplement poorly and desires to achieved higher levels of iron faster for adequate hematopoesis. Some of the side-effects to be expected including risks of infusion reactions, phlebitis, headaches, nausea and fatigue.  The patient is willing to proceed. Patient education material was dispensed.  Goal is to keep ferritin level greater than 50  

## 2014-09-25 NOTE — Assessment & Plan Note (Signed)
Clinically, she has no signs of disease recurrence. She will continue taking Arimidex.

## 2014-09-25 NOTE — Progress Notes (Signed)
[facility] OFFICE PROGRESS NOTE  Patient Care Team: Gordy Savers, MD as PCP - General (Internal Medicine)  SUMMARY OF ONCOLOGIC HISTORY:  #1 iron deficiency anemia, recurrent #2 Estrogen receptor positive breast cancer, no evidence of disease #3 large hiatal hernia #4 symptomatic abdominal wall hernia  SUMMARY OF ONCOLOGIC HISTORY: #1 iron deficiency anemia She was found to have abnormal CBC from routine blood work as part of her treatment related to diagnosis of breast cancer. From June 2014 to present, she was noted to be progressively anemic to the point requiring blood transfusion in February 2015.  The patient had undergone extensive evaluation in [place] prior to moving to [place]. Unfortunately I do not have records of those. According to her, she had EGD and colonoscopy and had received multiple units of blood transfusion last year. According to the patient, the hemoglobin dropped to 3 g at some point in time. She had received blood transfusion twice. With the most recent blood transfusion, according to the patient, she had a transfusion reaction. According to the blood typing on 11/19/2013, warm antibody was detected. The patient was prescribed oral iron supplements and she takes it 3 times a day with an empty stomach. She received 1 dose of intravenous iron on 12/09/13. 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 Malignant neoplasm of upper-outer quadrant of female breast   Primary site: Breast (Right)   Staging method: AJCC 7th Edition   Pathologic: Stage IA (T1, N0, cM0) signed by Victorino December, MD on 03/26/2013  8:21 AM   Summary: Stage IA (T1, N0, cM0) In January 2014 she had a mammogram performed that showed an abnormality in the right breast. This was in [place]. 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 Patient went on to have a right lumpectomy performed revealing a T1cN0 disease stage at diagnosis stage I. She subsequently had Oncotype DX performed that showed a recurrence score of 23 giving her a 10 year risk for recurrence but 10-30% in the intermediate risk category. Because of this she received adjuvant chemotherapy consisting of 4 cycles of Taxotere and Cytoxan. She tolerated it well. She completed adjuvant radiation therapy from 03/04/2013 through 04/17/2013. She started taking Arimidex since 05/16/2013.  INTERVAL HISTORY: Alexandria [name] 64 y.o. female returns for further followup. She has significant anxiety and is very miserable with discomfort on her abdominal wall and difficulties with breathing due to a sensation of pressure in her chest wall. She continued to gain weight. She had significant complication with recent attempted hiatal hernia surgery. She has severe abdominal pain and decreased appetite because of the abdominal pain.. The patient denies any recent signs or symptoms of bleeding such as spontaneous epistaxis, hematuria or hematochezia.  REVIEW OF SYSTEMS:   Constitutional: Denies fevers, chills or abnormal weight loss Eyes: Denies blurriness of vision Ears, nose, mouth, throat, and face: Denies mucositis or sore throat Respiratory: Denies cough, dyspnea or wheezes Cardiovascular: Denies palpitation, chest discomfort or lower extremity swelling Skin: Denies abnormal skin rashes Lymphatics: Denies new lymphadenopathy or easy bruising Neurological:Denies numbness, tingling or new weaknesses Behavioral/Psych: Mood is stable, no new changes  All other systems were reviewed with the patient and are negative.  I have reviewed the past medical history, past surgical history, social  history and family history with the patient and they are unchanged from previous  note.  ALLERGIES:  is allergic to contrast media; albuterol; and prednisone.  MEDICATIONS:  Current Outpatient Prescriptions  Medication Sig Dispense Refill  . albuterol (PROVENTIL HFA;VENTOLIN HFA) 108 (90 BASE) MCG/ACT inhaler Inhale 2 puffs into the lungs every 6 (six) hours as needed for wheezing or shortness of breath (wheezing). 1 Inhaler 5  . anastrozole (ARIMIDEX) 1 MG tablet Take 1 mg by mouth daily.    Marland Kitchen atorvastatin (LIPITOR) 20 MG tablet Take 1 tablet (20 mg total) by mouth daily. 90 tablet 3  . clotrimazole (LOTRIMIN) 1 % cream Apply topically 2 (two) times daily. 30 g 0  . dextromethorphan (DELSYM) 30 MG/5ML liquid Take 5 mLs (30 mg total) by mouth 2 (two) times daily. 89 mL 0  . docusate sodium 100 MG CAPS Take 100 mg by mouth 2 (two) times daily. 10 capsule 0  . escitalopram (LEXAPRO) 20 MG tablet Take 1 tablet (20 mg total) by mouth daily. 90 tablet 3  . ferrous sulfate 325 (65 FE) MG tablet Take 325 mg by mouth daily with breakfast.    . furosemide (LASIX) 80 MG tablet Take 0.5 tablets (40 mg total) by mouth daily.    Marland Kitchen HYDROmorphone (DILAUDID) 4 MG tablet Take 1 tablet (4 mg total) by mouth every 6 (six) hours as needed for severe pain (pain). 40 tablet 0  . HYDROmorphone HCl 16 MG T24A Take 32 mg by mouth daily.   0  . levalbuterol (XOPENEX) 0.63 MG/3ML nebulizer solution Take 0.63 mg by nebulization every 4 (four) hours as needed for wheezing or shortness of breath (shortness of breath).    . lidocaine (LIDODERM) 5 % Place 1-2 patches onto the skin daily. Remove & Discard patch within 12 hours or as directed by MD    . lidocaine-prilocaine (EMLA) cream Apply 1 application topically as needed (port access).     Marland Kitchen lisinopril (PRINIVIL,ZESTRIL) 5 MG tablet Take 1 tablet (5 mg total) by mouth daily. 30 tablet 1  . LORazepam (ATIVAN) 0.5 MG tablet TAKE 1 TABLET BY MOUTH TWICE DAILY 60 tablet 2  . lubiprostone (AMITIZA) 24 MCG capsule Take 1 capsule (24 mcg total) by mouth 2  (two) times daily with a meal. 60 capsule 0  . Multiple Vitamins-Minerals (HAIR/SKIN/NAILS PO) Take 1 tablet by mouth every morning.    . potassium chloride (KLOR-CON) 20 MEQ packet Take 40 mEq by mouth daily.     No current facility-administered medications for this visit.    PHYSICAL EXAMINATION: ECOG PERFORMANCE STATUS: 1 - Symptomatic but completely ambulatory  Filed Vitals:   09/25/14 1426  BP: 129/78  Pulse: 110  Temp: 98 F (36.7 C)  Resp: 18   Filed Weights   09/25/14 1426  Weight: 255 lb 12.8 oz (116.03 kg)    GENERAL:alert, no distress and comfortable. She is morbidly obese SKIN: skin color, texture, turgor are normal, no rashes or significant lesions EYES: normal, Conjunctiva are pale  and non-injected, sclera clear OROPHARYNX:no exudate, no erythema and lips, buccal mucosa, and tongue normal  NECK: supple, thyroid normal size, non-tender, without nodularity LYMPH:  no palpable lymphadenopathy in the cervical, axillary or inguinal LUNGS: clear to auscultation and percussion with normal breathing effort HEART: regular rate & rhythm and no murmurs and no lower extremity edema ABDOMEN:abdomen soft, palpable fullness and discomfort in the epigastrium region Musculoskeletal:no cyanosis of digits and no clubbing  NEURO: alert & oriented x 3  with fluent speech, no focal motor/sensory deficits  LABORATORY DATA:  I have reviewed the data as listed    Component Value Date/Time   NA 138 09/10/2014 1153   NA 142 01/01/2014 1400   K 4.0 09/10/2014 1153   K 3.2* 01/01/2014 1400   CL 100 09/10/2014 1153   CL 99 03/04/2013 1239   CO2 26 09/10/2014 1153   CO2 30* 01/01/2014 1400   GLUCOSE 140* 09/10/2014 1153   GLUCOSE 132 01/01/2014 1400   GLUCOSE 125* 03/04/2013 1239   BUN 24* 09/10/2014 1153   BUN 17.3 01/01/2014 1400   CREATININE 1.09 09/10/2014 1153   CREATININE 1.0 01/01/2014 1400   CALCIUM 8.5 09/10/2014 1153   CALCIUM 9.8 01/01/2014 1400   PROT 6.6 09/07/2014  2245   PROT 7.7 01/01/2014 1400   ALBUMIN 3.4* 09/07/2014 2245   ALBUMIN 3.7 01/01/2014 1400   AST 29 09/07/2014 2245   AST 32 01/01/2014 1400   ALT 29 09/07/2014 2245   ALT 29 01/01/2014 1400   ALKPHOS 188* 09/07/2014 2245   ALKPHOS 155* 01/01/2014 1400   BILITOT 0.3 09/07/2014 2245   BILITOT 0.25 01/01/2014 1400   GFRNONAA 54* 09/10/2014 1153   GFRAA 62* 09/10/2014 1153    No results found for: SPEP, UPEP  Lab Results  Component Value Date   WBC 7.3 09/25/2014   NEUTROABS 5.2 09/25/2014   HGB 10.5* 09/25/2014   HCT 33.9* 09/25/2014   MCV 77.9* 09/25/2014   PLT 222 09/25/2014      Chemistry      Component Value Date/Time   NA 138 09/10/2014 1153   NA 142 01/01/2014 1400   K 4.0 09/10/2014 1153   K 3.2* 01/01/2014 1400   CL 100 09/10/2014 1153   CL 99 03/04/2013 1239   CO2 26 09/10/2014 1153   CO2 30* 01/01/2014 1400   BUN 24* 09/10/2014 1153   BUN 17.3 01/01/2014 1400   CREATININE 1.09 09/10/2014 1153   CREATININE 1.0 01/01/2014 1400      Component Value Date/Time   CALCIUM 8.5 09/10/2014 1153   CALCIUM 9.8 01/01/2014 1400   ALKPHOS 188* 09/07/2014 2245   ALKPHOS 155* 01/01/2014 1400   AST 29 09/07/2014 2245   AST 32 01/01/2014 1400   ALT 29 09/07/2014 2245   ALT 29 01/01/2014 1400   BILITOT 0.3 09/07/2014 2245   BILITOT 0.25 01/01/2014 1400     ASSESSMENT & PLAN:  Malignant neoplasm of upper-outer quadrant of female breast Clinically, she has no signs of disease recurrence. She will continue taking Arimidex.  Iron deficiency anemia The most likely cause of her anemia is due to chronic blood loss/malabsorption syndrome. We discussed some of the risks, benefits, and alternatives of intravenous iron infusions. The patient is symptomatic from anemia and the iron level is critically low. She tolerated oral iron supplement poorly and desires to achieved higher levels of iron faster for adequate hematopoesis. Some of the side-effects to be expected including  risks of infusion reactions, phlebitis, headaches, nausea and fatigue.  The patient is willing to proceed. Patient education material was dispensed.  Goal is to keep ferritin level greater than 50   DOE (dyspnea on exertion) This is multifocal area, could be due to anemia and large hiatal hernia causing difficulties taking in deep breath. Surgery evaluation is pending. I will proceed to give her IV iron as discussed above.   Orders Placed This Encounter  Procedures  . CBC & Diff and Retic  Standing Status: Future     Number of Occurrences:      Standing Expiration Date: 10/30/2015  . Ferritin    Standing Status: Future     Number of Occurrences:      Standing Expiration Date: 10/30/2015  . Sedimentation rate    Standing Status: Future     Number of Occurrences:      Standing Expiration Date: 10/30/2015   All questions were answered. The patient knows to call the clinic with any problems, questions or concerns. No barriers to learning was detected. I spent 30 minutes counseling the patient face to face. The total time spent in the appointment was 40 minutes and more than 50% was on counseling and review of test results     [facility], [place], MD 09/25/2014 4:06 PM

## 2014-09-25 NOTE — Telephone Encounter (Signed)
lmonvm for pt re appts for 1/15 - 1/22 and july 2016. schedule mailed.

## 2014-09-25 NOTE — Assessment & Plan Note (Signed)
Recent decompensation with bronchitis -improved with diuresis   Plan  Continue on current regimen .  Low salt diet  Keep legs elevated  Follow up with Primary doctor as planned and .As needed   Follow up with Surgeon next week.  Labs today at Oncology as planned  Please contact office for sooner follow up if symptoms do not improve or worsen or seek emergency care  Follow up Dr. Gwenette Greet 6-8 weeks  and As needed   Okay for routine dental work with localized fillings.

## 2014-09-25 NOTE — Progress Notes (Signed)
Subjective:    Patient ID: Alexandria [name], female    DOB: 1950/11/11, 64 y.o.   MRN: [id number]  HPI 64 yo obese female never smoker seen for pulmonary consult in  May 2015 for dyspnea and mediastinal adenopathy.   09/07/2014 Acute OV  Pt presents for an acute office visit. Complains of worsening dyspnea, wheezing, cough , leg swelling over last 2 months.  On arrival to the office she has O2 sat 82% on RA .  Had recent ONO started on O2 At bedtime  .  Placed on O2 at 2l/m with sats 94%. CXR showed no acute process.  Says that her DOE has been progressive, over last year.  Previous CT chest showed mild mediastinal lymphadenopathy and was repeated in 06/2014 that showed stable mediastinal lymph nodes.  CT of her chest that shows at least 50% of her stomach in her left chest, with significant compromise of left lung volume.   Hx breast cancer diagnosed in January 2014  and underwent right lumpectomy and radiation therapy that finished in July of 2014 . She also has a history of a pulmonary embolus in the distant past, but had a ventilation perfusion scan 2014  that was unremarkable.   She has had chronic lower extremity edema, especially on the left since receiving chemotherapy. She says she has had venous dopplers that were neg.  The patient's weight has increased 120 pounds over the last one to 2 years. Last admission 07/14/14 for ventral incision hernia repair.  Denies chest pain, orthopnea, fever, hemoptysis , n/v/d.  Previous echo 02/2014 showed EF 55%, grade 1 DD.  Says she has been monitoring her o2 at home since Oct and has noticed that her O2 has been dropping in the 80's on room air.  >admit  09/25/2014 [facility] follow up Here for HFU - pt reports breathing is much improved since last ov.  no new complaints. She was treated for acute bronchitis with augmentin and pred taper. Diuresis for decompensated diastolic CHF .  She was seen by Cards and placed on ACE .  VQ was low prob for  PE .  Of note she has upper airway cough hx .  She denies flare of cough . She denies any chest pain, orthopnea, PND or leg swelling Has follow up with surgeon next week to discuss hiatal hernia repair.        Review of Systems  Constitutional:   No  weight loss, night sweats,  Fevers, chills,  +fatigue, or  lassitude.  HEENT:   No headaches,  Difficulty swallowing,  Tooth/dental problems, or  Sore throat,                No sneezing, itching, ear ache, nasal congestion, post nasal drip,   CV:  No chest pain,  Orthopnea, PND, ++swelling in lower extremities, anasarca, dizziness, palpitations, syncope.   GI  No heartburn, indigestion, abdominal pain, nausea, vomiting, diarrhea, change in bowel habits, loss of appetite, bloody stools.   Resp:+ shortness of breath with exertion    No coughing up of blood.  No change in color of mucus.  No wheezing.  No chest wall deformity  Skin: no rash or lesions.  GU: no dysuria, change in color of urine, no urgency or frequency.  No flank pain, no hematuria   MS:  No joint pain or swelling.  No decreased range of motion.  No back pain.  Psych:  No change in mood or affect. No depression  or anxiety.  No memory loss.          Objective:   Physical Exam GEN: A/Ox3; pleasant , NAD, morbidly obese, unsteady with cane, crying   HEENT:  [place]/AT,  EACs-clear, TMs-wnl, NOSE-clear, THROAT-clear, no lesions, no postnasal drip or exudate noted.   NECK:  Supple w/ fair ROM; no JVD; normal carotid impulses w/o bruits; no thyromegaly or nodules palpated; no lymphadenopathy.  RESP  Diminished BS in bases no accessory muscle use, no dullness to percussion  CARD:  RRR, no m/r/g  , 1+ peripheral edema, pulses intact, no cyanosis or clubbing.Lymphedema on left   GI:   Soft , obese & nt; nml bowel sounds; no organomegaly or masses detected.  Musco: Warm bil, no deformities or joint swelling noted.   Neuro: alert, no focal deficits noted.    Skin:  Warm, no lesions or rashes   CXR 09/07/2014  Large hiatal hernia. Mild volume loss at the left base secondary to that. No active process otherwise.        Assessment & Plan:

## 2014-09-25 NOTE — Assessment & Plan Note (Signed)
Resolved with abx and steroids

## 2014-09-25 NOTE — Assessment & Plan Note (Signed)
Resolved ,  VQ scan low prob for PE   Plan  Continue on current regimen .  Low salt diet  Keep legs elevated  Please contact office for sooner follow up if symptoms do not improve or worsen or seek emergency care  Follow up Dr. Gwenette Greet 6-8 weeks  and As needed

## 2014-09-25 NOTE — Assessment & Plan Note (Signed)
Hx of VCD and UAC  Use caution on ACE -may aggravate cough .

## 2014-09-25 NOTE — Patient Instructions (Addendum)
Continue on current regimen .  Low salt diet  Keep legs elevated  Follow up with Primary doctor as planned and .As needed   Follow up with Surgeon next week.  Labs today at Oncology as planned  Please contact office for sooner follow up if symptoms do not improve or worsen or seek emergency care  Follow up Dr. Gwenette Greet 6-8 weeks  and As needed   Okay for routine dental work with localized fillings.

## 2014-09-25 NOTE — Assessment & Plan Note (Signed)
This is multifocal area, could be due to anemia and large hiatal hernia causing difficulties taking in deep breath. Surgery evaluation is pending. I will proceed to give her IV iron as discussed above.

## 2014-09-25 NOTE — Progress Notes (Signed)
Patient saw Dr Alvy Bimler today, was told her iron is low. She will receive 2 infusions next week. She reports fatigue, loss of appetite and soreness inside her right breast. She states she has been exercising and using her arms, so she feels her soreness may be due to this new activity. She has hoarseness today with cough. She states she saw her "lung dr today", and that she "has a frog in her throat". Pt's weight and VS recorded from her med onc visit today.

## 2014-09-28 ENCOUNTER — Telehealth: Payer: Self-pay | Admitting: Hematology and Oncology

## 2014-09-28 ENCOUNTER — Ambulatory Visit: Payer: Self-pay | Admitting: Hematology and Oncology

## 2014-09-28 ENCOUNTER — Other Ambulatory Visit: Payer: Self-pay

## 2014-09-28 ENCOUNTER — Telehealth: Payer: Self-pay | Admitting: *Deleted

## 2014-09-28 NOTE — Telephone Encounter (Signed)
PT. IS PALE AND FATIGUED. SHE IS HAVING SOME DIFFICULTY BREATHING WITH EXERTION. PT. HAS DIFFICULTY SLEEPING. SHE STATES "TOO TIRED TO SLEEP". PT.'S HUSBAND STATES IT IS NOT AN EMERGENCY BUT DOES NOT WANT HIS WIFE TO WAIT UNTIL Friday FOR HER IV IRON. INFORMED PT.'S HUSBAND THAT DUE TO THE LATE HOUR MAY NOT RETURN HIS CALL UNTIL TOMORROW SO IF HIS WIFE'S CONDITION WORSEN HE SHOULD TAKE HER TO THE EMERGENCY DEPARTMENT. HE VOICES UNDERSTANDING.

## 2014-09-28 NOTE — Telephone Encounter (Signed)
s.w pt husband and confirmed that i had earlier appt they did not want to d.t.Marland KitchenMarland KitchenMarland Kitchentransfered him to triage per request

## 2014-09-29 ENCOUNTER — Ambulatory Visit: Payer: Self-pay | Admitting: Cardiology

## 2014-09-29 ENCOUNTER — Telehealth: Payer: Self-pay | Admitting: Hematology and Oncology

## 2014-09-29 ENCOUNTER — Telehealth: Payer: Self-pay | Admitting: *Deleted

## 2014-09-29 NOTE — Telephone Encounter (Signed)
Can we get her IV iron moved?

## 2014-09-29 NOTE — Telephone Encounter (Signed)
Pt called to be worked in for IV iron. Offered appt for this afternoon. Scheduling has already rescheduled to 1/13. Pt is satisfied with appt. Requests to move 1/22 appt to 1/20. Order sent to scheduler.

## 2014-09-29 NOTE — Telephone Encounter (Signed)
pt called to r/s 1.15 appt to 1.13..done pt aware of new d.t

## 2014-09-30 ENCOUNTER — Ambulatory Visit (HOSPITAL_BASED_OUTPATIENT_CLINIC_OR_DEPARTMENT_OTHER): Payer: Managed Care, Other (non HMO)

## 2014-09-30 DIAGNOSIS — D509 Iron deficiency anemia, unspecified: Secondary | ICD-10-CM

## 2014-09-30 DIAGNOSIS — D649 Anemia, unspecified: Secondary | ICD-10-CM

## 2014-09-30 MED ORDER — SODIUM CHLORIDE 0.9 % IV SOLN
Freq: Once | INTRAVENOUS | Status: AC
  Administered 2014-09-30: 15:00:00 via INTRAVENOUS

## 2014-09-30 MED ORDER — FERUMOXYTOL INJECTION 510 MG/17 ML
510.0000 mg | Freq: Once | INTRAVENOUS | Status: AC
  Administered 2014-09-30: 510 mg via INTRAVENOUS
  Filled 2014-09-30: qty 17

## 2014-09-30 NOTE — Patient Instructions (Signed)

## 2014-09-30 NOTE — Progress Notes (Signed)
Dr Julien Nordmann informed of low O2 level 79 % on RA.  Dr Alvy Bimler not at the clinic.  Per Dr Vista Mink, okay to proceed with iron infusion if O2 saturation increased with oxygen.  O2 level increased to 96% on 2L.

## 2014-10-01 ENCOUNTER — Inpatient Hospital Stay (HOSPITAL_COMMUNITY)
Admission: EM | Admit: 2014-10-01 | Discharge: 2014-10-06 | DRG: 193 | Disposition: A | Discharge status: Home / Self Care | Payer: Medicare Other | Attending: Internal Medicine | Admitting: Internal Medicine

## 2014-10-01 ENCOUNTER — Encounter (HOSPITAL_COMMUNITY): Payer: Self-pay

## 2014-10-01 ENCOUNTER — Emergency Department (HOSPITAL_COMMUNITY): Payer: Medicare Other

## 2014-10-01 ENCOUNTER — Telehealth: Payer: Self-pay | Admitting: Internal Medicine

## 2014-10-01 ENCOUNTER — Other Ambulatory Visit: Payer: Self-pay

## 2014-10-01 DIAGNOSIS — R1030 Lower abdominal pain, unspecified: Secondary | ICD-10-CM | POA: Diagnosis not present

## 2014-10-01 DIAGNOSIS — J441 Chronic obstructive pulmonary disease with (acute) exacerbation: Secondary | ICD-10-CM | POA: Diagnosis present

## 2014-10-01 DIAGNOSIS — F418 Other specified anxiety disorders: Secondary | ICD-10-CM | POA: Diagnosis not present

## 2014-10-01 DIAGNOSIS — Z79899 Other long term (current) drug therapy: Secondary | ICD-10-CM | POA: Diagnosis not present

## 2014-10-01 DIAGNOSIS — F339 Major depressive disorder, recurrent, unspecified: Secondary | ICD-10-CM | POA: Diagnosis present

## 2014-10-01 DIAGNOSIS — T502X5A Adverse effect of carbonic-anhydrase inhibitors, benzothiadiazides and other diuretics, initial encounter: Secondary | ICD-10-CM | POA: Diagnosis present

## 2014-10-01 DIAGNOSIS — E876 Hypokalemia: Secondary | ICD-10-CM | POA: Diagnosis present

## 2014-10-01 DIAGNOSIS — K219 Gastro-esophageal reflux disease without esophagitis: Secondary | ICD-10-CM | POA: Diagnosis present

## 2014-10-01 DIAGNOSIS — D500 Iron deficiency anemia secondary to blood loss (chronic): Secondary | ICD-10-CM | POA: Diagnosis present

## 2014-10-01 DIAGNOSIS — G2581 Restless legs syndrome: Secondary | ICD-10-CM | POA: Diagnosis present

## 2014-10-01 DIAGNOSIS — I517 Cardiomegaly: Secondary | ICD-10-CM | POA: Diagnosis not present

## 2014-10-01 DIAGNOSIS — Z825 Family history of asthma and other chronic lower respiratory diseases: Secondary | ICD-10-CM

## 2014-10-01 DIAGNOSIS — Z170 Estrogen receptor positive status [ER+]: Secondary | ICD-10-CM | POA: Diagnosis not present

## 2014-10-01 DIAGNOSIS — C50411 Malignant neoplasm of upper-outer quadrant of right female breast: Secondary | ICD-10-CM | POA: Diagnosis not present

## 2014-10-01 DIAGNOSIS — C50911 Malignant neoplasm of unspecified site of right female breast: Secondary | ICD-10-CM | POA: Diagnosis present

## 2014-10-01 DIAGNOSIS — J9601 Acute respiratory failure with hypoxia: Secondary | ICD-10-CM | POA: Diagnosis not present

## 2014-10-01 DIAGNOSIS — R0602 Shortness of breath: Secondary | ICD-10-CM | POA: Diagnosis not present

## 2014-10-01 DIAGNOSIS — I5032 Chronic diastolic (congestive) heart failure: Secondary | ICD-10-CM | POA: Diagnosis present

## 2014-10-01 DIAGNOSIS — K439 Ventral hernia without obstruction or gangrene: Secondary | ICD-10-CM | POA: Diagnosis present

## 2014-10-01 DIAGNOSIS — Z9981 Dependence on supplemental oxygen: Secondary | ICD-10-CM | POA: Diagnosis not present

## 2014-10-01 DIAGNOSIS — R509 Fever, unspecified: Secondary | ICD-10-CM | POA: Diagnosis not present

## 2014-10-01 DIAGNOSIS — F4323 Adjustment disorder with mixed anxiety and depressed mood: Secondary | ICD-10-CM | POA: Diagnosis present

## 2014-10-01 DIAGNOSIS — I10 Essential (primary) hypertension: Secondary | ICD-10-CM | POA: Diagnosis not present

## 2014-10-01 DIAGNOSIS — J209 Acute bronchitis, unspecified: Secondary | ICD-10-CM | POA: Diagnosis present

## 2014-10-01 DIAGNOSIS — D509 Iron deficiency anemia, unspecified: Secondary | ICD-10-CM | POA: Diagnosis not present

## 2014-10-01 DIAGNOSIS — G8929 Other chronic pain: Secondary | ICD-10-CM

## 2014-10-01 DIAGNOSIS — R05 Cough: Secondary | ICD-10-CM | POA: Diagnosis not present

## 2014-10-01 DIAGNOSIS — R600 Localized edema: Secondary | ICD-10-CM | POA: Diagnosis not present

## 2014-10-01 DIAGNOSIS — Z9221 Personal history of antineoplastic chemotherapy: Secondary | ICD-10-CM

## 2014-10-01 DIAGNOSIS — K449 Diaphragmatic hernia without obstruction or gangrene: Secondary | ICD-10-CM | POA: Diagnosis present

## 2014-10-01 DIAGNOSIS — Z923 Personal history of irradiation: Secondary | ICD-10-CM

## 2014-10-01 DIAGNOSIS — T40605A Adverse effect of unspecified narcotics, initial encounter: Secondary | ICD-10-CM | POA: Diagnosis present

## 2014-10-01 DIAGNOSIS — Z91041 Radiographic dye allergy status: Secondary | ICD-10-CM | POA: Diagnosis not present

## 2014-10-01 DIAGNOSIS — Z853 Personal history of malignant neoplasm of breast: Secondary | ICD-10-CM

## 2014-10-01 DIAGNOSIS — M549 Dorsalgia, unspecified: Secondary | ICD-10-CM | POA: Diagnosis present

## 2014-10-01 DIAGNOSIS — Z6839 Body mass index (BMI) 39.0-39.9, adult: Secondary | ICD-10-CM | POA: Diagnosis not present

## 2014-10-01 DIAGNOSIS — J189 Pneumonia, unspecified organism: Principal | ICD-10-CM | POA: Diagnosis present

## 2014-10-01 DIAGNOSIS — Y95 Nosocomial condition: Secondary | ICD-10-CM | POA: Diagnosis present

## 2014-10-01 DIAGNOSIS — R140 Abdominal distension (gaseous): Secondary | ICD-10-CM | POA: Diagnosis not present

## 2014-10-01 DIAGNOSIS — Z820 Family history of epilepsy and other diseases of the nervous system: Secondary | ICD-10-CM | POA: Diagnosis not present

## 2014-10-01 DIAGNOSIS — C50419 Malignant neoplasm of upper-outer quadrant of unspecified female breast: Secondary | ICD-10-CM | POA: Diagnosis not present

## 2014-10-01 DIAGNOSIS — I890 Lymphedema, not elsewhere classified: Secondary | ICD-10-CM | POA: Diagnosis present

## 2014-10-01 DIAGNOSIS — R109 Unspecified abdominal pain: Secondary | ICD-10-CM | POA: Diagnosis not present

## 2014-10-01 DIAGNOSIS — Z791 Long term (current) use of non-steroidal anti-inflammatories (NSAID): Secondary | ICD-10-CM | POA: Diagnosis not present

## 2014-10-01 DIAGNOSIS — Z888 Allergy status to other drugs, medicaments and biological substances status: Secondary | ICD-10-CM

## 2014-10-01 DIAGNOSIS — J440 Chronic obstructive pulmonary disease with acute lower respiratory infection: Secondary | ICD-10-CM | POA: Diagnosis present

## 2014-10-01 DIAGNOSIS — A419 Sepsis, unspecified organism: Secondary | ICD-10-CM | POA: Diagnosis not present

## 2014-10-01 DIAGNOSIS — Z86711 Personal history of pulmonary embolism: Secondary | ICD-10-CM | POA: Diagnosis not present

## 2014-10-01 DIAGNOSIS — Z79891 Long term (current) use of opiate analgesic: Secondary | ICD-10-CM | POA: Diagnosis not present

## 2014-10-01 DIAGNOSIS — R112 Nausea with vomiting, unspecified: Secondary | ICD-10-CM | POA: Diagnosis not present

## 2014-10-01 DIAGNOSIS — K5900 Constipation, unspecified: Secondary | ICD-10-CM

## 2014-10-01 DIAGNOSIS — R1032 Left lower quadrant pain: Secondary | ICD-10-CM | POA: Diagnosis not present

## 2014-10-01 DIAGNOSIS — F411 Generalized anxiety disorder: Secondary | ICD-10-CM | POA: Diagnosis present

## 2014-10-01 DIAGNOSIS — K5909 Other constipation: Secondary | ICD-10-CM | POA: Diagnosis present

## 2014-10-01 DIAGNOSIS — M797 Fibromyalgia: Secondary | ICD-10-CM | POA: Diagnosis not present

## 2014-10-01 DIAGNOSIS — F419 Anxiety disorder, unspecified: Secondary | ICD-10-CM | POA: Diagnosis not present

## 2014-10-01 DIAGNOSIS — R918 Other nonspecific abnormal finding of lung field: Secondary | ICD-10-CM | POA: Diagnosis not present

## 2014-10-01 LAB — CBC WITH DIFFERENTIAL/PLATELET
BASOS ABS: 0 10*3/uL (ref 0.0–0.1)
Basophils Relative: 0 % (ref 0–1)
EOS PCT: 1 % (ref 0–5)
Eosinophils Absolute: 0.1 10*3/uL (ref 0.0–0.7)
HEMATOCRIT: 35.6 % — AB (ref 36.0–46.0)
HEMOGLOBIN: 10.9 g/dL — AB (ref 12.0–15.0)
LYMPHS ABS: 1.2 10*3/uL (ref 0.7–4.0)
Lymphocytes Relative: 8 % — ABNORMAL LOW (ref 12–46)
MCH: 24.3 pg — AB (ref 26.0–34.0)
MCHC: 30.6 g/dL (ref 30.0–36.0)
MCV: 79.5 fL (ref 78.0–100.0)
MONOS PCT: 5 % (ref 3–12)
Monocytes Absolute: 0.7 10*3/uL (ref 0.1–1.0)
NEUTROS PCT: 87 % — AB (ref 43–77)
Neutro Abs: 13.1 10*3/uL — ABNORMAL HIGH (ref 1.7–7.7)
Platelets: 234 10*3/uL (ref 150–400)
RBC: 4.48 MIL/uL (ref 3.87–5.11)
RDW: 19.8 % — ABNORMAL HIGH (ref 11.5–15.5)
WBC: 15.1 10*3/uL — ABNORMAL HIGH (ref 4.0–10.5)

## 2014-10-01 LAB — URINALYSIS, ROUTINE W REFLEX MICROSCOPIC
Bilirubin Urine: NEGATIVE
Glucose, UA: NEGATIVE mg/dL
Hgb urine dipstick: NEGATIVE
Ketones, ur: NEGATIVE mg/dL
LEUKOCYTES UA: NEGATIVE
Nitrite: NEGATIVE
PH: 6.5 (ref 5.0–8.0)
Protein, ur: NEGATIVE mg/dL
Specific Gravity, Urine: 1.012 (ref 1.005–1.030)
UROBILINOGEN UA: 0.2 mg/dL (ref 0.0–1.0)

## 2014-10-01 LAB — PROTIME-INR
INR: 1.02 (ref 0.00–1.49)
Prothrombin Time: 13.5 seconds (ref 11.6–15.2)

## 2014-10-01 LAB — LACTIC ACID, PLASMA: Lactic Acid, Venous: 1.8 mmol/L (ref 0.5–2.2)

## 2014-10-01 LAB — BRAIN NATRIURETIC PEPTIDE: B Natriuretic Peptide: 42.8 pg/mL (ref 0.0–100.0)

## 2014-10-01 MED ORDER — DOCUSATE SODIUM 100 MG PO CAPS
100.0000 mg | ORAL_CAPSULE | Freq: Two times a day (BID) | ORAL | Status: DC
  Administered 2014-10-02 – 2014-10-06 (×7): 100 mg via ORAL
  Filled 2014-10-01 (×11): qty 1

## 2014-10-01 MED ORDER — LEVALBUTEROL HCL 0.63 MG/3ML IN NEBU
0.6300 mg | INHALATION_SOLUTION | Freq: Four times a day (QID) | RESPIRATORY_TRACT | Status: DC
  Administered 2014-10-02 – 2014-10-05 (×5): 0.63 mg via RESPIRATORY_TRACT
  Filled 2014-10-01 (×9): qty 3

## 2014-10-01 MED ORDER — HYDROMORPHONE HCL 1 MG/ML IJ SOLN
0.5000 mg | Freq: Once | INTRAMUSCULAR | Status: AC
  Administered 2014-10-01: 0.5 mg via INTRAVENOUS
  Filled 2014-10-01: qty 1

## 2014-10-01 MED ORDER — PIPERACILLIN-TAZOBACTAM 3.375 G IVPB 30 MIN
3.3750 g | INTRAVENOUS | Status: AC
  Administered 2014-10-01: 3.375 g via INTRAVENOUS
  Filled 2014-10-01: qty 50

## 2014-10-01 MED ORDER — MORPHINE SULFATE 2 MG/ML IJ SOLN
2.0000 mg | INTRAMUSCULAR | Status: DC | PRN
  Administered 2014-10-02 (×2): 2 mg via INTRAVENOUS
  Filled 2014-10-01 (×3): qty 1

## 2014-10-01 MED ORDER — LORAZEPAM 0.5 MG PO TABS
0.5000 mg | ORAL_TABLET | Freq: Two times a day (BID) | ORAL | Status: DC | PRN
  Administered 2014-10-02: 0.5 mg via ORAL
  Filled 2014-10-01 (×2): qty 1

## 2014-10-01 MED ORDER — LORAZEPAM 2 MG/ML IJ SOLN
0.5000 mg | Freq: Once | INTRAMUSCULAR | Status: AC
  Administered 2014-10-01: 0.5 mg via INTRAVENOUS
  Filled 2014-10-01: qty 1

## 2014-10-01 MED ORDER — ONDANSETRON HCL 4 MG/2ML IJ SOLN
4.0000 mg | Freq: Once | INTRAMUSCULAR | Status: AC
  Administered 2014-10-01: 4 mg via INTRAVENOUS
  Filled 2014-10-01: qty 2

## 2014-10-01 MED ORDER — HYDROMORPHONE HCL 2 MG PO TABS
4.0000 mg | ORAL_TABLET | Freq: Four times a day (QID) | ORAL | Status: DC | PRN
  Administered 2014-10-02: 4 mg via ORAL
  Filled 2014-10-01: qty 2

## 2014-10-01 MED ORDER — METHYLPREDNISOLONE SODIUM SUCC 125 MG IJ SOLR
60.0000 mg | INTRAMUSCULAR | Status: DC
  Administered 2014-10-02: 60 mg via INTRAVENOUS
  Filled 2014-10-01: qty 2

## 2014-10-01 MED ORDER — SODIUM CHLORIDE 0.9 % IV BOLUS (SEPSIS)
1000.0000 mL | INTRAVENOUS | Status: AC
  Administered 2014-10-01: 1000 mL via INTRAVENOUS

## 2014-10-01 MED ORDER — FERROUS SULFATE 325 (65 FE) MG PO TABS: 325.0000 mg | ORAL_TABLET | Freq: Every day | ORAL | Status: DC

## 2014-10-01 MED ORDER — VANCOMYCIN HCL IN DEXTROSE 750-5 MG/150ML-% IV SOLN
750.0000 mg | INTRAVENOUS | Status: AC
  Administered 2014-10-02: 750 mg via INTRAVENOUS
  Filled 2014-10-01: qty 150

## 2014-10-01 MED ORDER — BISACODYL 10 MG RE SUPP
10.0000 mg | Freq: Once | RECTAL | Status: AC
  Administered 2014-10-02: 10 mg via RECTAL
  Filled 2014-10-01: qty 1

## 2014-10-01 MED ORDER — HEPARIN SODIUM (PORCINE) 5000 UNIT/ML IJ SOLN
5000.0000 [IU] | Freq: Three times a day (TID) | INTRAMUSCULAR | Status: DC
  Administered 2014-10-02 – 2014-10-06 (×13): 5000 [IU] via SUBCUTANEOUS
  Filled 2014-10-01 (×17): qty 1

## 2014-10-01 MED ORDER — ANASTROZOLE 1 MG PO TABS
1.0000 mg | ORAL_TABLET | Freq: Every day | ORAL | Status: DC
  Administered 2014-10-02 – 2014-10-06 (×5): 1 mg via ORAL
  Filled 2014-10-01 (×6): qty 1

## 2014-10-01 MED ORDER — SODIUM CHLORIDE 0.9 % IV SOLN
INTRAVENOUS | Status: DC
Start: 2014-10-01 — End: 2014-10-02
  Administered 2014-10-02: 02:00:00 via INTRAVENOUS

## 2014-10-01 MED ORDER — ESCITALOPRAM OXALATE 20 MG PO TABS: 20.0000 mg | ORAL_TABLET | Freq: Every day | ORAL | Status: DC

## 2014-10-01 MED ORDER — ATORVASTATIN CALCIUM 10 MG PO TABS: 20.0000 mg | ORAL_TABLET | Freq: Every day | ORAL | Status: DC

## 2014-10-01 MED ORDER — IPRATROPIUM BROMIDE 0.02 % IN SOLN
0.5000 mg | RESPIRATORY_TRACT | Status: DC
Start: 2014-10-02 — End: 2014-10-02
  Administered 2014-10-02: 0.5 mg via RESPIRATORY_TRACT
  Filled 2014-10-01 (×2): qty 2.5

## 2014-10-01 MED ORDER — LUBIPROSTONE 24 MCG PO CAPS
24.0000 ug | ORAL_CAPSULE | Freq: Two times a day (BID) | ORAL | Status: DC
  Administered 2014-10-02 – 2014-10-06 (×8): 24 ug via ORAL
  Filled 2014-10-01 (×11): qty 1

## 2014-10-01 MED ORDER — DEXTROMETHORPHAN POLISTIREX 30 MG/5ML PO LQCR: 30.0000 mg | Freq: Two times a day (BID) | ORAL | Status: DC

## 2014-10-01 MED ORDER — VANCOMYCIN HCL 10 G IV SOLR
1750.0000 mg | INTRAVENOUS | Status: AC
Start: 2014-10-01 — End: 2014-10-01
  Administered 2014-10-01: 1750 mg via INTRAVENOUS
  Filled 2014-10-01: qty 1750

## 2014-10-01 MED ORDER — FENTANYL CITRATE 0.05 MG/ML IJ SOLN
50.0000 ug | Freq: Once | INTRAMUSCULAR | Status: AC
  Administered 2014-10-01: 50 ug via INTRAVENOUS
  Filled 2014-10-01: qty 2

## 2014-10-01 MED ORDER — HYDROMORPHONE HCL ER 8 MG PO T24A
32.0000 mg | EXTENDED_RELEASE_TABLET | Freq: Every day | ORAL | Status: DC
  Administered 2014-10-02 (×2): 32 mg via ORAL
  Filled 2014-10-01 (×4): qty 4

## 2014-10-01 NOTE — Telephone Encounter (Signed)
Alexandria [name], please call pt and tell her per Dr.K said she needs to go Urgent Care or see another provider.

## 2014-10-01 NOTE — Telephone Encounter (Signed)
Please see message. °

## 2014-10-01 NOTE — Telephone Encounter (Signed)
[facility] - Client [facility]  Patient Name: [name]  DOB: Jan 16, 1951    Nurse Assessment  Nurse: Loletta Specter, RN, Wells Guiles Date/Time Eilene Ghazi Time): 10/01/2014 4:29:16 PM  Confirm and document reason for call. If symptomatic, describe symptoms. ---Caller states pt has temp 102.6 and labored breathing. Paramedics checked her today and told her she was fine.  Has the patient traveled out of the country within the last 30 days? ---Not Applicable  Does the patient require triage? ---Yes  Related visit to physician within the last 2 weeks? ---No  Does the PT have any chronic conditions? (i.e. diabetes, asthma, etc.) ---No     Guidelines    Guideline Title Affirmed Question Affirmed Notes  Breathing Difficulty Slow, shallow and weak breathing    Final Disposition User   Call EMS 911 Now Loletta Specter, Therapist, sports, Wells Guiles

## 2014-10-01 NOTE — Telephone Encounter (Signed)
Called and discussed

## 2014-10-01 NOTE — Telephone Encounter (Signed)
Husband called paramedics and they took her to the hospital. Pt has appt mon and husband will be here with her. Husband is on dpr and would really like dr k to give him a call this evening if possible,.

## 2014-10-01 NOTE — Telephone Encounter (Signed)
Patient was transferred to scheduling from team health.  Patient was advised to call 911 for her difficulty breathing but patient declined and wants to see PCP. Dr. Raliegh Ip doesn't have any opening tomorrow and patient declined seeing another provider.  Patient is requesting that Dr. Raliegh Ip squeeze her in tomorrow.

## 2014-10-01 NOTE — ED Provider Notes (Signed)
CSN: [id number]     Arrival date & time 10/01/14  1747 History   First MD Initiated Contact with Patient 10/01/14 1813     Chief Complaint  Patient presents with  . Shortness of Breath  . Fever     (Consider location/radiation/quality/duration/timing/severity/associated sxs/prior Treatment) Patient is a 64 y.o. female presenting with shortness of breath and fever. The history is provided by the patient.  Shortness of Breath Severity:  Moderate Onset quality:  Sudden Duration:  2 days Timing:  Constant Progression:  Worsening Chronicity:  New Context: URI   Relieved by:  Nothing Worsened by:  Nothing tried Ineffective treatments:  None tried Associated symptoms: abdominal pain, chest pain, cough, fever (subjective) and vomiting   Associated symptoms: no headaches and no neck pain   Fever Associated symptoms: chest pain, cough, nausea and vomiting   Associated symptoms: no congestion, no diarrhea, no dysuria and no headaches     Past Medical History  Diagnosis Date  . Iron deficiency anemia   . Status post chemotherapy     4 cycles of Taxotere and cytoxan  . Chronic back pain     "all the way up and down"  . Hx of pulmonary embolus     During c-Section  . Peripheral neuropathic pain   . Obesity     Class 2  . S/P radiation therapy  03/04/2013-04/17/2013    1) Right breast / 50 Gy in 25 fractions/ 2) Right breast boost / 10 Gy in 5 fractions  . Diastolic heart failure   . Hx of radiation therapy 03/04/13- 04/17/13    right breast 50 Gy 25 fractions, right breast boost 10 Gy 5 fractions  . Anxiety   . H/O hiatal hernia   . 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 GAD (generalized anxiety disorder)   . 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 Louretta Shorten  . Lymphedema   . Complication of anesthesia     "I come out really anxious; need Ativan to ease me out"  (09/07/2014)  . Hypertension   . Diastolic dysfunction   . Pneumonia     "at least twice/yr" (09/07/2014)  . Chronic bronchitis     "get it q yr" (09/07/2014)  . On home oxygen therapy     "2L just at night" (09/07/2014)  . History of blood transfusion "3-4"    "never can tell why; don't know where the blood was coming from; doesn't show up in stool or urine"  . Daily headache     "recently" (09/07/2014)  . DDD (degenerative disc disease), cervical   . DDD (degenerative disc disease), lumbosacral   . DDD (degenerative disc disease), thoracolumbar   . Fibromyalgia    Past Surgical History  Procedure Laterality Date  . Cesarean section  0177; 1978; 1980;     "stillborn in 49"  . Partial colectomy N/A 07/04/2014    Procedure: REPAIR OF INCARCERATED INCISIONAL HERNIA WITH MESH;  Surgeon: Excell Seltzer, MD;  Location: [facility];  Service: General;  Laterality: N/A;  . Tonsillectomy    . Appendectomy    . Breast biopsy Right   . Breast lumpectomy Right   . Inguinal hernia repair Bilateral     "cancer"  . Umbilical hernia repair  X 2  . Dilation and curettage of uterus  "numerous"  . Abdominal hysterectomy    .  Foot surgery Right ~ 2013 X 3    "put pins in but pins kept breaking"   Family History  Problem Relation Age of Onset  . Parkinson's disease Mother   . Emphysema Maternal Grandfather   . COPD Sister   . CAD Neg Hx    History  Substance Use Topics  . Smoking status: Never Smoker   . Smokeless tobacco: Never Used  . Alcohol Use: No   OB History    No data available     Review of Systems  Constitutional: Positive for fever (subjective). Negative for fatigue.  HENT: Negative for congestion and drooling.   Eyes: Negative for pain.  Respiratory: Positive for cough and shortness of breath.   Cardiovascular: Positive for chest pain.  Gastrointestinal: Positive for nausea, vomiting and abdominal pain. Negative for diarrhea.  Genitourinary: Negative for dysuria  and hematuria.  Musculoskeletal: Negative for back pain, gait problem and neck pain.  Skin: Negative for color change.  Neurological: Negative for dizziness and headaches.  Hematological: Negative for adenopathy.  Psychiatric/Behavioral: Negative for behavioral problems.  All other systems reviewed and are negative.     Allergies  Contrast media; Albuterol; and Prednisone  Home Medications   Prior to Admission medications   Medication Sig Start Date End Date Taking? Authorizing Provider  albuterol (PROVENTIL HFA;VENTOLIN HFA) 108 (90 BASE) MCG/ACT inhaler Inhale 2 puffs into the lungs every 6 (six) hours as needed for wheezing or shortness of breath (wheezing). 07/29/14  Yes Marletta Lor, MD  anastrozole (ARIMIDEX) 1 MG tablet Take 1 mg by mouth daily.    Historical Provider, MD  atorvastatin (LIPITOR) 20 MG tablet Take 1 tablet (20 mg total) by mouth daily. 06/18/14   Marletta Lor, MD  clotrimazole (LOTRIMIN) 1 % cream Apply topically 2 (two) times daily. 09/10/14   Grace Bushy Minor, NP  dextromethorphan (DELSYM) 30 MG/5ML liquid Take 5 mLs (30 mg total) by mouth 2 (two) times daily. 09/10/14   Grace Bushy Minor, NP  docusate sodium 100 MG CAPS Take 100 mg by mouth 2 (two) times daily. 07/10/14   Megan N Dort, PA-C  escitalopram (LEXAPRO) 20 MG tablet Take 1 tablet (20 mg total) by mouth daily. 01/22/14   Heath Lark, MD  ferrous sulfate 325 (65 FE) MG tablet Take 325 mg by mouth daily with breakfast. 11/20/13   Kinnie Feil, MD  furosemide (LASIX) 80 MG tablet Take 0.5 tablets (40 mg total) by mouth daily. 09/10/14   Grace Bushy Minor, NP  HYDROmorphone (DILAUDID) 4 MG tablet Take 1 tablet (4 mg total) by mouth every 6 (six) hours as needed for severe pain (pain). 07/10/14   Megan N Dort, PA-C  HYDROmorphone HCl 16 MG T24A Take 32 mg by mouth daily.  07/23/14   Historical Provider, MD  levalbuterol Penne Lash) 0.63 MG/3ML nebulizer solution Take 0.63 mg by nebulization every 4  (four) hours as needed for wheezing or shortness of breath (shortness of breath).    Historical Provider, MD  lidocaine (LIDODERM) 5 % Place 1-2 patches onto the skin daily. Remove & Discard patch within 12 hours or as directed by MD    Historical Provider, MD  lidocaine-prilocaine (EMLA) cream Apply 1 application topically as needed (port access).     Historical Provider, MD  lisinopril (PRINIVIL,ZESTRIL) 5 MG tablet Take 1 tablet (5 mg total) by mouth daily. 09/10/14   Grace Bushy Minor, NP  LORazepam (ATIVAN) 0.5 MG tablet TAKE 1 TABLET BY MOUTH TWICE DAILY  08/28/14   Marletta Lor, MD  lubiprostone (AMITIZA) 24 MCG capsule Take 1 capsule (24 mcg total) by mouth 2 (two) times daily with a meal. 04/03/14   Heath Lark, MD  Multiple Vitamins-Minerals (HAIR/SKIN/NAILS PO) Take 1 tablet by mouth every morning.    Historical Provider, MD  potassium chloride (KLOR-CON) 20 MEQ packet Take 40 mEq by mouth daily. 09/10/14   Grace Bushy Minor, NP   BP 135/75 mmHg  Pulse 140  Temp(Src) 98.5 F (36.9 C) (Oral)  Resp 22  SpO2 87%  LMP 01/30/2005 Physical Exam  Constitutional: She appears well-developed and well-nourished.  HENT:  Head: Normocephalic.  Mouth/Throat: No oropharyngeal exudate.  Eyes: Conjunctivae and EOM are normal. Pupils are equal, round, and reactive to light.  Neck: Normal range of motion. Neck supple.  Cardiovascular: Normal heart sounds and intact distal pulses.  Exam reveals no gallop and no friction rub.   No murmur heard. HR 150's  Pulmonary/Chest: She is in respiratory distress. She has no wheezes.  Abdominal: Soft. Bowel sounds are normal. There is no tenderness. There is no rebound and no guarding.  Musculoskeletal: Normal range of motion. She exhibits no edema or tenderness.   Asymmetric appearing lower extremities with moderate erythema noted in the left lower extremity. She notes this is not significant change from baseline.  Neurological: She is alert.  Skin: Skin  is warm and dry.  Psychiatric: She has a normal mood and affect. Her behavior is normal.  Nursing note and vitals reviewed.   ED Course  Procedures (including critical care time) Labs Review Labs Reviewed  CBC WITH DIFFERENTIAL - Abnormal; Notable for the following:    WBC 15.1 (*)    Hemoglobin 10.9 (*)    HCT 35.6 (*)    MCH 24.3 (*)    RDW 19.8 (*)    Neutrophils Relative % 87 (*)    Neutro Abs 13.1 (*)    Lymphocytes Relative 8 (*)    All other components within normal limits  URINALYSIS, ROUTINE W REFLEX MICROSCOPIC - Abnormal; Notable for the following:    APPearance CLOUDY (*)    All other components within normal limits  COMPREHENSIVE METABOLIC PANEL - Abnormal; Notable for the following:    Potassium 3.0 (*)    Glucose, Bld 121 (*)    Calcium 8.2 (*)    Albumin 3.2 (*)    Alkaline Phosphatase 150 (*)    GFR calc non Af Amer 54 (*)    GFR calc Af Amer 63 (*)    All other components within normal limits  BLOOD GAS, VENOUS - Abnormal; Notable for the following:    pH, Ven 7.408 (*)    Bicarbonate 29.9 (*)    Acid-Base Excess 4.9 (*)    All other components within normal limits  CBC WITH DIFFERENTIAL - Abnormal; Notable for the following:    RBC 3.79 (*)    Hemoglobin 9.5 (*)    HCT 30.2 (*)    MCH 25.1 (*)    RDW 20.0 (*)    Neutrophils Relative % 93 (*)    Neutro Abs 8.9 (*)    Lymphocytes Relative 6 (*)    Lymphs Abs 0.6 (*)    Monocytes Relative 1 (*)    All other components within normal limits  COMPREHENSIVE METABOLIC PANEL - Abnormal; Notable for the following:    Potassium 3.2 (*)    Glucose, Bld 189 (*)    Calcium 8.2 (*)    Albumin 3.0 (*)  Alkaline Phosphatase 131 (*)    GFR calc non Af Amer 57 (*)    GFR calc Af Amer 66 (*)    All other components within normal limits  MRSA PCR SCREENING  CULTURE, BLOOD (ROUTINE X 2)  CULTURE, BLOOD (ROUTINE X 2)  CULTURE, EXPECTORATED SPUTUM-ASSESSMENT  GRAM STAIN  PROTIME-INR  BRAIN NATRIURETIC  PEPTIDE  LACTIC ACID, PLASMA  TROPONIN I  STREP PNEUMONIAE URINARY ANTIGEN  PROTIME-INR  LACTIC ACID, PLASMA  SEDIMENTATION RATE  LEGIONELLA ANTIGEN, URINE  CBC WITH DIFFERENTIAL  INFLUENZA PANEL BY PCR (TYPE A & B, H1N1)  FERRITIN    Imaging Review Ct Abdomen Pelvis Wo Contrast  10/01/2014   CLINICAL DATA:  Left lower quadrant abdominal pain. Distention. Poor historian. Respiratory distress. Anxious.  EXAM: CT ABDOMEN AND PELVIS WITHOUT CONTRAST  TECHNIQUE: Multidetector CT imaging of the abdomen and pelvis was performed following the standard protocol without IV contrast.  COMPARISON:  07/04/2014  FINDINGS: Lower chest: Patchy infiltrates are identified at the lung bases, right greater than left. Stable rounded atelectasis ni the right lower lobe. Coronary artery calcifications are present. Moderate hiatal hernia is noted. No pleural effusions.  Upper abdomen: No focal abnormality identified within the liver, spleen, pancreas, or adrenal glands. Gallbladder is present.  Gastrointestinal tract: Large hiatal hernia. Small bowel loops appear normal. There is moderate stool throughout nondilated loops of colon. Surgical clips are identified in the region of the cecum and what appears to be appendiceal tissue. This may represent an appendiceal stump based on the history of prior appendectomy.  Pelvis: Uterus is absent.  No adnexal mass identified.  Retroperitoneum: No retroperitoneal or mesenteric adenopathy. No evidence for aortic aneurysm.  Abdominal wall: Postoperative changes in the anterior abdominal wall. Small bowel loops are closely apposed to the anterior abdominal wall suggesting possible adhesions.  Osseous structures: Anterolisthesis of L5 on S1, grade 1.  IMPRESSION: 1. Patchy bilateral lower lobe infiltrates, right greater than left. 2. Stable right lower lobe rounded atelectasis. 3. Moderate hiatal hernia, stable. 4. Moderate stool burden.   Electronically Signed   By: Shon Hale M.D.    On: 10/01/2014 21:11   Dg Chest 2 View  10/01/2014   CLINICAL DATA:  Shortness of breath; cough; Per EMS, pt from home. Seen yesterday and d/c for anemia. Pt called EMS twice today and refused transport on first encounter. Pt being noncompliant. Taking off nasal canula even though short of breath. Pt has had increased shortness of breath and fever at home; delay in CXR because patient was refusing  EXAM: CHEST  2 VIEW  COMPARISON:  09/07/2014  FINDINGS: Shallow lung inflation. Heart is mildly enlarged. There are patchy densities at the lung bases consistent with infectious process. Hiatal hernia. No pulmonary edema.  IMPRESSION: 1. Cardiomegaly without edema. 2. Bilateral lower lobe infiltrates. 3. Hiatal hernia.   Electronically Signed   By: Shon Hale M.D.   On: 10/01/2014 20:59     EKG Interpretation   Date/Time:  Thursday October 01 2014 18:01:53 EST Ventricular Rate:  152 PR Interval:    QRS Duration: 92 QT Interval:  332 QTC Calculation: 528 R Axis:   35 Text Interpretation:  Sinus tachycardia Abnormal R-wave progression, late  transition Prolonged QT interval Baseline wander in lead(s) V2 Otherwise  no significant change Confirmed by   MD,  ([id number]) on  10/01/2014 6:57:29 PM     CRITICAL CARE Performed by: Pamella Pert, S Total critical care time: 30 min Critical care time was  exclusive of separately billable procedures and treating other patients. Critical care was necessary to treat or prevent imminent or life-threatening deterioration. Critical care was time spent personally by me on the following activities: development of treatment plan with patient and/or surrogate as well as nursing, discussions with consultants, evaluation of patient's response to treatment, examination of patient, obtaining history from patient or surrogate, ordering and performing treatments and interventions, ordering and review of laboratory studies, ordering and review of radiographic  studies, pulse oximetry and re-evaluation of patient's condition.  MDM   Final diagnoses:  SOB (shortness of breath)  LLQ abdominal pain  Sepsis, due to unspecified organism  HCAP (healthcare-associated pneumonia)    6:57 PM 64 y.o. female w hx of Fe def anemia, PE during c-section, breast cancer, HTN  Who presents with sudden onset shortness of breath which began 2 days ago. She also reports cough productive of green sputum and subjective fevers. She also complains of nonspecific chest pain which she cannot quantify but does not currently have chest pain on exam. She also complains of left lower quadrant abdominal pain which she has trouble quantifying. She has had some nausea and vomiting today. She states that she is on 2 L via nasal cannula at baseline which she sometimes increases to 4 L. She is a heart rate in the 150s and is tachypneic on exam. Blood pressure is stable and she is afebrile. She was hypoxic on room air. She is not wearing her nasal cannula appropriately and prefers to put it in her mouth as opposed to her nose. She is difficult to elicit history from. She is allergic to contrast and states that she cannot lie flat. Will work up for sepsis given RR and HR w/ poor hx. PE also on Ddx. Pt had neg V/Q scan on 12/22.   Pt will be admitted to stepdown. Critical care documented in this pt w/ Sepsis/HCAP w/ initial HR in the 150's and coming down to the 120's w/ 1L IVF, significant tachypnea on exam, and hypoxia on usual O2 of 2L [place]. IVF"s were titrated to effect in the ED d/t hx of diastolic heart failure and current respiratory compromise.  Pamella Pert, MD 10/02/14 1124

## 2014-10-01 NOTE — ED Notes (Signed)
Bed: DS89 Expected date:  Expected time:  Means of arrival:  Comments: EMS- fever, SOB, IV established

## 2014-10-01 NOTE — ED Notes (Signed)
Per EMS, pt from home.  Seen yesterday and d/c for anemia.  Pt called EMS twice today and refused transport on first encounter.  Pt being noncompliant.  Taking off nasal canula even though short of breath.  Pt has had increased shortness of breath and fever at home.  Pt states same here yesterday.  Vitals: 132/82, hr 150, resp 24, 94% 4 l per [place]

## 2014-10-02 ENCOUNTER — Ambulatory Visit: Payer: Self-pay

## 2014-10-02 DIAGNOSIS — F418 Other specified anxiety disorders: Secondary | ICD-10-CM

## 2014-10-02 DIAGNOSIS — I10 Essential (primary) hypertension: Secondary | ICD-10-CM

## 2014-10-02 DIAGNOSIS — E876 Hypokalemia: Secondary | ICD-10-CM

## 2014-10-02 DIAGNOSIS — R109 Unspecified abdominal pain: Secondary | ICD-10-CM

## 2014-10-02 DIAGNOSIS — C50419 Malignant neoplasm of upper-outer quadrant of unspecified female breast: Secondary | ICD-10-CM

## 2014-10-02 DIAGNOSIS — M797 Fibromyalgia: Secondary | ICD-10-CM

## 2014-10-02 DIAGNOSIS — R600 Localized edema: Secondary | ICD-10-CM

## 2014-10-02 DIAGNOSIS — G2581 Restless legs syndrome: Secondary | ICD-10-CM

## 2014-10-02 DIAGNOSIS — K219 Gastro-esophageal reflux disease without esophagitis: Secondary | ICD-10-CM

## 2014-10-02 DIAGNOSIS — J9601 Acute respiratory failure with hypoxia: Secondary | ICD-10-CM

## 2014-10-02 DIAGNOSIS — R112 Nausea with vomiting, unspecified: Secondary | ICD-10-CM

## 2014-10-02 DIAGNOSIS — R509 Fever, unspecified: Secondary | ICD-10-CM

## 2014-10-02 DIAGNOSIS — D509 Iron deficiency anemia, unspecified: Secondary | ICD-10-CM

## 2014-10-02 LAB — COMPREHENSIVE METABOLIC PANEL
ALBUMIN: 3.2 g/dL — AB (ref 3.5–5.2)
ALT: 21 U/L (ref 0–35)
ALT: 22 U/L (ref 0–35)
ANION GAP: 11 (ref 5–15)
AST: 24 U/L (ref 0–37)
AST: 22 U/L (ref 0–37)
Albumin: 3 g/dL — ABNORMAL LOW (ref 3.5–5.2)
Alkaline Phosphatase: 150 U/L — ABNORMAL HIGH (ref 39–117)
Alkaline Phosphatase: 131 U/L — ABNORMAL HIGH (ref 39–117)
Anion gap: 7 (ref 5–15)
BILIRUBIN TOTAL: 0.6 mg/dL (ref 0.3–1.2)
BUN: 19 mg/dL (ref 6–23)
BUN: 20 mg/dL (ref 6–23)
CHLORIDE: 99 meq/L (ref 96–112)
CO2: 28 mmol/L (ref 19–32)
CO2: 30 mmol/L (ref 19–32)
Calcium: 8.2 mg/dL — ABNORMAL LOW (ref 8.4–10.5)
Calcium: 8.2 mg/dL — ABNORMAL LOW (ref 8.4–10.5)
Chloride: 97 mEq/L (ref 96–112)
Creatinine, Ser: 1.08 mg/dL (ref 0.50–1.10)
Creatinine, Ser: 1.04 mg/dL (ref 0.50–1.10)
GFR calc Af Amer: 66 mL/min — ABNORMAL LOW (ref >90)
GFR calc non Af Amer: 54 mL/min — ABNORMAL LOW (ref >90)
GFR calc non Af Amer: 57 mL/min — ABNORMAL LOW (ref >90)
GFR, EST AFRICAN AMERICAN: 63 mL/min — AB (ref >90)
Glucose, Bld: 121 mg/dL — ABNORMAL HIGH (ref 70–99)
Glucose, Bld: 189 mg/dL — ABNORMAL HIGH (ref 70–99)
Potassium: 3 mmol/L — ABNORMAL LOW (ref 3.5–5.1)
Potassium: 3.2 mmol/L — ABNORMAL LOW (ref 3.5–5.1)
Sodium: 136 mmol/L (ref 135–145)
Sodium: 136 mmol/L (ref 135–145)
TOTAL PROTEIN: 6.1 g/dL (ref 6.0–8.3)
Total Bilirubin: 0.6 mg/dL (ref 0.3–1.2)
Total Protein: 6.6 g/dL (ref 6.0–8.3)

## 2014-10-02 LAB — LACTIC ACID, PLASMA: Lactic Acid, Venous: 1.5 mmol/L (ref 0.5–2.2)

## 2014-10-02 LAB — CBC WITH DIFFERENTIAL/PLATELET
BASOS ABS: 0 10*3/uL (ref 0.0–0.1)
Basophils Relative: 0 % (ref 0–1)
Eosinophils Absolute: 0 10*3/uL (ref 0.0–0.7)
Eosinophils Relative: 0 % (ref 0–5)
HCT: 30.2 % — ABNORMAL LOW (ref 36.0–46.0)
Hemoglobin: 9.5 g/dL — ABNORMAL LOW (ref 12.0–15.0)
LYMPHS PCT: 6 % — AB (ref 12–46)
Lymphs Abs: 0.6 10*3/uL — ABNORMAL LOW (ref 0.7–4.0)
MCH: 25.1 pg — AB (ref 26.0–34.0)
MCHC: 31.5 g/dL (ref 30.0–36.0)
MCV: 79.7 fL (ref 78.0–100.0)
Monocytes Absolute: 0.1 10*3/uL (ref 0.1–1.0)
Monocytes Relative: 1 % — ABNORMAL LOW (ref 3–12)
NEUTROS ABS: 8.9 10*3/uL — AB (ref 1.7–7.7)
Neutrophils Relative %: 93 % — ABNORMAL HIGH (ref 43–77)
Platelets: 185 10*3/uL (ref 150–400)
RBC: 3.79 MIL/uL — AB (ref 3.87–5.11)
RDW: 20 % — ABNORMAL HIGH (ref 11.5–15.5)
WBC: 9.6 10*3/uL (ref 4.0–10.5)

## 2014-10-02 LAB — PROTIME-INR
INR: 1.05 (ref 0.00–1.49)
Prothrombin Time: 13.8 seconds (ref 11.6–15.2)

## 2014-10-02 LAB — BLOOD GAS, VENOUS
ACID-BASE EXCESS: 4.9 mmol/L — AB (ref 0.0–2.0)
BICARBONATE: 29.9 meq/L — AB (ref 20.0–24.0)
O2 Content: 8 L/min
O2 SAT: 72.9 %
PCO2 VEN: 48.3 mmHg (ref 45.0–50.0)
PH VEN: 7.408 — AB (ref 7.250–7.300)
Patient temperature: 98.6
TCO2: 27.7 mmol/L (ref 0–100)

## 2014-10-02 LAB — INFLUENZA PANEL BY PCR (TYPE A & B)
H1N1 flu by pcr: NOT DETECTED
Influenza A By PCR: NEGATIVE
Influenza B By PCR: NEGATIVE

## 2014-10-02 LAB — TROPONIN I

## 2014-10-02 LAB — SEDIMENTATION RATE: Sed Rate: 22 mm/hr (ref 0–22)

## 2014-10-02 LAB — FERRITIN: Ferritin: 202 ng/mL (ref 10–291)

## 2014-10-02 LAB — MRSA PCR SCREENING: MRSA by PCR: NEGATIVE

## 2014-10-02 LAB — STREP PNEUMONIAE URINARY ANTIGEN: Strep Pneumo Urinary Antigen: NEGATIVE

## 2014-10-02 MED ORDER — METHYLPREDNISOLONE SODIUM SUCC 40 MG IJ SOLR
40.0000 mg | Freq: Two times a day (BID) | INTRAMUSCULAR | Status: DC
Start: 2014-10-02 — End: 2014-10-02

## 2014-10-02 MED ORDER — METHYLPREDNISOLONE SODIUM SUCC 40 MG IJ SOLR
40.0000 mg | Freq: Two times a day (BID) | INTRAMUSCULAR | Status: DC
  Administered 2014-10-02 – 2014-10-03 (×3): 40 mg via INTRAVENOUS
  Filled 2014-10-02 (×5): qty 1

## 2014-10-02 MED ORDER — LORAZEPAM 0.5 MG PO TABS
0.5000 mg | ORAL_TABLET | Freq: Once | ORAL | Status: AC
  Administered 2014-10-02: 0.5 mg via ORAL
  Filled 2014-10-02: qty 1

## 2014-10-02 MED ORDER — IPRATROPIUM BROMIDE 0.02 % IN SOLN
0.5000 mg | Freq: Four times a day (QID) | RESPIRATORY_TRACT | Status: DC
  Administered 2014-10-02 – 2014-10-05 (×4): 0.5 mg via RESPIRATORY_TRACT
  Filled 2014-10-02 (×8): qty 2.5

## 2014-10-02 MED ORDER — LORAZEPAM 2 MG/ML IJ SOLN
0.5000 mg | Freq: Two times a day (BID) | INTRAMUSCULAR | Status: DC | PRN
  Administered 2014-10-02 – 2014-10-03 (×3): 0.5 mg via INTRAVENOUS
  Filled 2014-10-02 (×3): qty 1

## 2014-10-02 MED ORDER — ALPRAZOLAM 1 MG PO TABS
1.0000 mg | ORAL_TABLET | Freq: Once | ORAL | Status: AC
  Administered 2014-10-02: 1 mg via ORAL
  Filled 2014-10-02: qty 1

## 2014-10-02 MED ORDER — HYDROMORPHONE HCL 1 MG/ML IJ SOLN
0.5000 mg | INTRAMUSCULAR | Status: DC | PRN
Start: 2014-10-02 — End: 2014-10-02

## 2014-10-02 MED ORDER — HYDROMORPHONE HCL 1 MG/ML IJ SOLN: 1.0000 mg | INTRAMUSCULAR | Status: DC | PRN

## 2014-10-02 MED ORDER — DIAZEPAM 5 MG PO TABS: 5.0000 mg | ORAL_TABLET | Freq: Four times a day (QID) | ORAL | Status: DC | PRN

## 2014-10-02 MED ORDER — HALOPERIDOL LACTATE 5 MG/ML IJ SOLN: 1.0000 mg | Freq: Once | INTRAMUSCULAR | Status: DC

## 2014-10-02 MED ORDER — POTASSIUM CHLORIDE 10 MEQ/100ML IV SOLN
INTRAVENOUS | Status: AC
  Filled 2014-10-02: qty 100

## 2014-10-02 MED ORDER — HYDROMORPHONE HCL 4 MG PO TABS
4.0000 mg | ORAL_TABLET | Freq: Four times a day (QID) | ORAL | Status: DC | PRN
  Administered 2014-10-02 – 2014-10-06 (×14): 4 mg via ORAL
  Filled 2014-10-02 (×14): qty 1
  Filled 2014-10-02: qty 2

## 2014-10-02 MED ORDER — MAGNESIUM SULFATE 2 GM/50ML IV SOLN: 2.0000 g | Freq: Once | INTRAVENOUS | Status: DC

## 2014-10-02 MED ORDER — POTASSIUM CHLORIDE CRYS ER 20 MEQ PO TBCR
40.0000 meq | EXTENDED_RELEASE_TABLET | Freq: Every day | ORAL | Status: DC
  Administered 2014-10-02 – 2014-10-05 (×4): 40 meq via ORAL
  Filled 2014-10-02 (×5): qty 2

## 2014-10-02 MED ORDER — CETYLPYRIDINIUM CHLORIDE 0.05 % MT LIQD
7.0000 mL | Freq: Two times a day (BID) | OROMUCOSAL | Status: DC
  Administered 2014-10-02 – 2014-10-05 (×7): 7 mL via OROMUCOSAL

## 2014-10-02 MED ORDER — VANCOMYCIN HCL IN DEXTROSE 750-5 MG/150ML-% IV SOLN
750.0000 mg | Freq: Two times a day (BID) | INTRAVENOUS | Status: DC
Start: 2014-10-02 — End: 2014-10-04
  Administered 2014-10-02 – 2014-10-04 (×5): 750 mg via INTRAVENOUS
  Filled 2014-10-02 (×6): qty 150

## 2014-10-02 MED ORDER — ROPINIROLE HCL 0.25 MG PO TABS
0.2500 mg | ORAL_TABLET | Freq: Every day | ORAL | Status: DC
  Administered 2014-10-02 – 2014-10-05 (×3): 0.25 mg via ORAL
  Filled 2014-10-02 (×6): qty 1

## 2014-10-02 MED ORDER — HYDROMORPHONE HCL 1 MG/ML IJ SOLN
0.5000 mg | Freq: Once | INTRAMUSCULAR | Status: AC
  Administered 2014-10-02: 0.5 mg via INTRAVENOUS
  Filled 2014-10-02: qty 1

## 2014-10-02 MED ORDER — KETOROLAC TROMETHAMINE 30 MG/ML IJ SOLN
30.0000 mg | Freq: Once | INTRAMUSCULAR | Status: AC
  Administered 2014-10-02: 30 mg via INTRAVENOUS

## 2014-10-02 MED ORDER — MAGNESIUM SULFATE 50 % IJ SOLN
2.0000 g | Freq: Once | INTRAMUSCULAR | Status: AC
  Administered 2014-10-02: 2 g via INTRAVENOUS
  Filled 2014-10-02: qty 4

## 2014-10-02 MED ORDER — METHOCARBAMOL 1000 MG/10ML IJ SOLN
500.0000 mg | Freq: Three times a day (TID) | INTRAVENOUS | Status: DC | PRN
  Administered 2014-10-03: 500 mg via INTRAVENOUS
  Filled 2014-10-02 (×3): qty 5

## 2014-10-02 MED ORDER — BUDESONIDE 0.25 MG/2ML IN SUSP
0.2500 mg | Freq: Two times a day (BID) | RESPIRATORY_TRACT | Status: DC
  Administered 2014-10-02 – 2014-10-06 (×3): 0.25 mg via RESPIRATORY_TRACT
  Filled 2014-10-02 (×5): qty 2

## 2014-10-02 MED ORDER — PANTOPRAZOLE SODIUM 40 MG PO TBEC
40.0000 mg | DELAYED_RELEASE_TABLET | Freq: Every day | ORAL | Status: DC
  Administered 2014-10-02 – 2014-10-06 (×5): 40 mg via ORAL
  Filled 2014-10-02 (×6): qty 1

## 2014-10-02 MED ORDER — DIAZEPAM 5 MG PO TABS: 10.0000 mg | ORAL_TABLET | Freq: Four times a day (QID) | ORAL | Status: DC | PRN

## 2014-10-02 MED ORDER — POTASSIUM CHLORIDE CRYS ER 20 MEQ PO TBCR: 40.0000 meq | EXTENDED_RELEASE_TABLET | Freq: Once | ORAL | Status: DC

## 2014-10-02 MED ORDER — CARISOPRODOL 350 MG PO TABS
350.0000 mg | ORAL_TABLET | Freq: Once | ORAL | Status: AC
  Administered 2014-10-02: 350 mg via ORAL
  Filled 2014-10-02: qty 1

## 2014-10-02 MED ORDER — DEXTROSE 5 % IV SOLN
1.0000 g | Freq: Three times a day (TID) | INTRAVENOUS | Status: AC
  Administered 2014-10-02 – 2014-10-04 (×9): 1 g via INTRAVENOUS
  Filled 2014-10-02 (×10): qty 1

## 2014-10-02 MED ORDER — FENTANYL CITRATE 0.05 MG/ML IJ SOLN
25.0000 ug | Freq: Once | INTRAMUSCULAR | Status: AC
  Administered 2014-10-02: 25 ug via INTRAVENOUS

## 2014-10-02 MED ORDER — HYDROMORPHONE HCL 1 MG/ML IJ SOLN
1.0000 mg | INTRAMUSCULAR | Status: DC | PRN
  Administered 2014-10-02 (×3): 2 mg via INTRAVENOUS
  Filled 2014-10-02 (×3): qty 2

## 2014-10-02 MED ORDER — CYCLOBENZAPRINE HCL 5 MG PO TABS: 7.5000 mg | ORAL_TABLET | Freq: Three times a day (TID) | ORAL | Status: DC | PRN

## 2014-10-02 MED ORDER — ACETAMINOPHEN 325 MG PO TABS
650.0000 mg | ORAL_TABLET | Freq: Four times a day (QID) | ORAL | Status: DC | PRN
  Administered 2014-10-02 – 2014-10-04 (×3): 650 mg via ORAL
  Filled 2014-10-02 (×3): qty 2

## 2014-10-02 MED ORDER — LEVALBUTEROL HCL 0.63 MG/3ML IN NEBU
0.6300 mg | INHALATION_SOLUTION | RESPIRATORY_TRACT | Status: DC | PRN
Start: 2014-10-02 — End: 2014-10-05

## 2014-10-02 MED ORDER — FENTANYL CITRATE 0.05 MG/ML IJ SOLN
INTRAMUSCULAR | Status: AC
  Filled 2014-10-02: qty 2

## 2014-10-02 MED ORDER — FLEET ENEMA 7-19 GM/118ML RE ENEM: 1.0000 | ENEMA | Freq: Once | RECTAL | Status: DC

## 2014-10-02 MED ORDER — LORAZEPAM 0.5 MG PO TABS
0.5000 mg | ORAL_TABLET | Freq: Once | ORAL | Status: AC
  Administered 2014-10-02: 0.5 mg via ORAL

## 2014-10-02 MED ORDER — POTASSIUM CHLORIDE 10 MEQ/100ML IV SOLN
10.0000 meq | INTRAVENOUS | Status: AC
  Administered 2014-10-02 (×2): 10 meq via INTRAVENOUS
  Filled 2014-10-02: qty 100

## 2014-10-02 MED ORDER — MAGNESIUM HYDROXIDE 400 MG/5ML PO SUSP
15.0000 mL | Freq: Every day | ORAL | Status: DC
  Administered 2014-10-03: 15 mL via ORAL
  Filled 2014-10-02 (×2): qty 30

## 2014-10-02 MED ORDER — KETOROLAC TROMETHAMINE 30 MG/ML IJ SOLN
INTRAMUSCULAR | Status: AC
  Filled 2014-10-02: qty 1

## 2014-10-02 MED ORDER — LIDOCAINE 5 % EX PTCH
2.0000 | MEDICATED_PATCH | CUTANEOUS | Status: DC
  Administered 2014-10-02 – 2014-10-06 (×5): 2 via TRANSDERMAL
  Filled 2014-10-02 (×7): qty 2

## 2014-10-02 NOTE — Telephone Encounter (Signed)
Pt called ems and was transported to the hospital. Dr Raliegh Ip aware. Previous note w/ info.

## 2014-10-02 NOTE — Progress Notes (Signed)
Bilateral lower extremity venous duplex completed:  No obvious evidence of DVT, superficial thrombosis, or Baker's cyst.  Technically difficult study due to the patient's body habitus and movements.

## 2014-10-02 NOTE — Progress Notes (Signed)
{  T is slightly confused but was able to demonstrate hand on understanding of Flutter device.

## 2014-10-02 NOTE — Progress Notes (Signed)
Patient admitted to 1236 alert, oriented and follows commands. Patient had frequent requests to get OOB, for fluids, for multiple needs for comfort. The requests were numerous and rapid. Unable to supply her needs before she was changing her request or adding more. Her medical orders were delayed due to RN attempting to fulfill frequent requests. She requested pain med and was given Morphine after receiving her long acting dilaudid. She went to sleep before morphine was brought back to the room but woke within 15 minutes and requested it. She slept short intervals woke c/o muscle spasms and pain worsening in the knees. Muscle  relaxer was given. Patient proceeded to escalate with agitation and c/o pain. Walked around the room for more than an hour. Crying, screaming and wailing. She had one to two staff members in her room at all times. NP for [facility] came to see her. Ativan was repeated. Thirty minutes later dilaudid oral dose was given, which was discussed with e-link MD and [facility] NP. Patient continued to be restless and agitated c/o leg pain and back pain. OOB up in room with constant supervision. MD at bedsisde and e-link MD involved with plan of care to treat pain.  Patient had emotional outbursts crying and requesting pain meds. Sitter at the bedside. She would doze for several minutes and then wake agitated and angry. Toradol was given.. Attempted to call her husband at her request and was unable to get the home number  --left message on the cell phone. At shift change patient remains unchanged and report given.

## 2014-10-02 NOTE — Progress Notes (Signed)
[facility] PROGRESS NOTE  Alexandria [name] ZTI:[id number] DOB: [birth year] DOA: 10/01/2014 PCP: Nyoka Cowden, MD  Assessment/Plan: 1-acute resp failure with hypoxia: secondary to PNA and exacerbation of chronic bronchitis -will start pulmicort -continue solumedrol -continue antibiotics (giving recent admission will use coverage for HCAP) -oxygen supplementation with goals for O2 sat > 92% -follow flu by PCR results and continue droplet isolation currently -PRN antipyretics and supportive care -continue PRN xopenex   2-HTN: BP stable. Will continue holding lisinopril and lasix for now  3-anxiety/depression: will continue PRN lorazepam. Patient instructed to use seroquel in the past, but has declined -if needed will ask for psych consult  4-hypokalemia: will replete K and magnesium -follow labs in am  5-chronic pain: will continue ER dilaudid and IV dilaudid for breakthrough  -will also add robaxin PRN  6-abd pain, N/V: probably associated with PNA. Patient with hx of hiatal hernia -will advance diet -start PPI -PRN antiemetics  7-hx of breast cancer: continue arimidex   Code Status: Full Family Communication: no family at bedside Disposition Plan: remains inpatient; if symptoms continue improving and VS stable will transfer out of stepdown later today (watching resp status closely)   Consultants:  None   Procedures:  See below for x-ray reports   Antibiotics:  vanc  Cefepime   HPI/Subjective: Patient breathing easier and more calm. Very agitated/restless overnight. Denies CP, abd pain, nausea or vomiting   Objective: Filed Vitals:   10/02/14 0730  BP:   Pulse: 79  Temp:   Resp: 19    Intake/Output Summary (Last 24 hours) at 10/02/14 0808 Last data filed at 10/02/14 0754  Gross per 24 hour  Intake   2100 ml  Output    400 ml  Net   1700 ml   Filed Weights   10/02/14 0109  Weight: 119.1 kg (262 lb 9.1 oz)    Exam:   General:   Patient spike fever up to 101 overnight and currently is complaining of chills; feeling that her breathing is better. Still with complaints of pain on her knee (right)  Cardiovascular: regular rate, no rubs, no gallops, S1 and S2 appreciated  Respiratory: positive rhonchi and end exp wheezing appreciated; no crackles  Abdomen: obese, soft, no tenderness, positive BS and no guarding  Musculoskeletal: edema 1-2++ bilaterally (unchanged from her baseline according to patient); no cyanosis   Data Reviewed: Basic Metabolic Panel:  Recent Labs Lab 10/02/14 0006  NA 136  K 3.0*  CL 97  CO2 28  GLUCOSE 121*  BUN 20  CREATININE 1.08  CALCIUM 8.2*   Liver Function Tests:  Recent Labs Lab 10/02/14 0006  AST 22  ALT 21  ALKPHOS 150*  BILITOT 0.6  PROT 6.6  ALBUMIN 3.2*   CBC:  Recent Labs Lab 09/25/14 1320 10/01/14 1927 10/02/14 0730  WBC 7.3 15.1* 9.6  NEUTROABS 5.2 13.1* 8.9*  HGB 10.5* 10.9* 9.5*  HCT 33.9* 35.6* 30.2*  MCV 77.9* 79.5 79.7  PLT 222 234 185   Cardiac Enzymes:  Recent Labs Lab 10/02/14 0006  TROPONINI <0.03   BNP (last 3 results)  Recent Labs  09/07/14 2245  PROBNP 304.7*   CBG: No results for input(s): GLUCAP in the last 168 hours.  Recent Results (from the past 240 hour(s))  MRSA PCR Screening     Status: None   Collection Time: 10/02/14  1:12 AM  Result Value Ref Range Status   MRSA by PCR NEGATIVE NEGATIVE Final    Comment:  The GeneXpert MRSA Assay (FDA approved for NASAL specimens only), is one component of a comprehensive MRSA colonization surveillance program. It is not intended to diagnose MRSA infection nor to guide or monitor treatment for MRSA infections.      Studies: Ct Abdomen Pelvis Wo Contrast  10/01/2014   CLINICAL DATA:  Left lower quadrant abdominal pain. Distention. Poor historian. Respiratory distress. Anxious.  EXAM: CT ABDOMEN AND PELVIS WITHOUT CONTRAST  TECHNIQUE: Multidetector CT imaging of  the abdomen and pelvis was performed following the standard protocol without IV contrast.  COMPARISON:  07/04/2014  FINDINGS: Lower chest: Patchy infiltrates are identified at the lung bases, right greater than left. Stable rounded atelectasis ni the right lower lobe. Coronary artery calcifications are present. Moderate hiatal hernia is noted. No pleural effusions.  Upper abdomen: No focal abnormality identified within the liver, spleen, pancreas, or adrenal glands. Gallbladder is present.  Gastrointestinal tract: Large hiatal hernia. Small bowel loops appear normal. There is moderate stool throughout nondilated loops of colon. Surgical clips are identified in the region of the cecum and what appears to be appendiceal tissue. This may represent an appendiceal stump based on the history of prior appendectomy.  Pelvis: Uterus is absent.  No adnexal mass identified.  Retroperitoneum: No retroperitoneal or mesenteric adenopathy. No evidence for aortic aneurysm.  Abdominal wall: Postoperative changes in the anterior abdominal wall. Small bowel loops are closely apposed to the anterior abdominal wall suggesting possible adhesions.  Osseous structures: Anterolisthesis of L5 on S1, grade 1.  IMPRESSION: 1. Patchy bilateral lower lobe infiltrates, right greater than left. 2. Stable right lower lobe rounded atelectasis. 3. Moderate hiatal hernia, stable. 4. Moderate stool burden.   Electronically Signed   By: Shon Hale M.D.   On: 10/01/2014 21:11   Dg Chest 2 View  10/01/2014   CLINICAL DATA:  Shortness of breath; cough; Per EMS, pt from home. Seen yesterday and d/c for anemia. Pt called EMS twice today and refused transport on first encounter. Pt being noncompliant. Taking off nasal canula even though short of breath. Pt has had increased shortness of breath and fever at home; delay in CXR because patient was refusing  EXAM: CHEST  2 VIEW  COMPARISON:  09/07/2014  FINDINGS: Shallow lung inflation. Heart is mildly  enlarged. There are patchy densities at the lung bases consistent with infectious process. Hiatal hernia. No pulmonary edema.  IMPRESSION: 1. Cardiomegaly without edema. 2. Bilateral lower lobe infiltrates. 3. Hiatal hernia.   Electronically Signed   By: Shon Hale M.D.   On: 10/01/2014 20:59    Scheduled Meds: . anastrozole  1 mg Oral Daily  . budesonide (PULMICORT) nebulizer solution  0.25 mg Nebulization BID  . ceFEPime (MAXIPIME) IV  1 g Intravenous 3 times per day  . docusate sodium  100 mg Oral BID  . heparin  5,000 Units Subcutaneous 3 times per day  . HYDROmorphone HCl  32 mg Oral QHS  . ipratropium  0.5 mg Nebulization Q6H  . levalbuterol  0.63 mg Nebulization Q6H  . lidocaine  2 patch Transdermal Q24H  . lubiprostone  24 mcg Oral BID WC  . magnesium hydroxide  15 mL Oral Daily  . magnesium sulfate 1 - 4 g bolus IVPB  2 g Intravenous Once  . methylPREDNISolone (SOLU-MEDROL) injection  40 mg Intravenous BID  . potassium chloride  10 mEq Intravenous Q1 Hr x 4  . rOPINIRole  0.25 mg Oral QHS  . vancomycin  750 mg Intravenous Q12H  Continuous Infusions:   Principal Problem:   HCAP (healthcare-associated pneumonia) Active Problems:   Malignant neoplasm of upper-outer quadrant of female breast   Chronic back pain   Morbid obesity   Adjustment disorder with mixed anxiety and depressed mood   Chronic constipation   Iron deficiency anemia   Essential hypertension    Time spent: 30 minutes    Barton Dubois  [facility] Pager [phone]. If 7PM-7AM, please contact night-coverage at [email or web address], password [place] 10/02/2014, 8:08 AM  LOS: 1 day

## 2014-10-02 NOTE — Progress Notes (Signed)
ANTIBIOTIC CONSULT NOTE - INITIAL  Pharmacy Consult for Cefepime/Vancomycin Indication: HCAP  Allergies  Allergen Reactions  . Contrast Media [Iodinated Diagnostic Agents] Anaphylaxis  . Albuterol Other (See Comments)    Anxious   . Prednisone Other (See Comments)    Pt gets very agitated when she takes high doses of steroids    Patient Measurements: Height: _0  (172.7 cm) Weight: 262 lb 9.1 oz (119.1 kg) IBW/kg (Calculated) : 63.9   Vital Signs: Temp: 98.5 F (36.9 C) (01/15 0109) Temp Source: Oral (01/15 0109) BP: 110/70 mmHg (01/15 0109) Pulse Rate: 139 (01/15 0109) Intake/Output from previous day: 01/14 0701 - 01/15 0700 In: 1600 [IV Piggyback:1600] Out: -  Intake/Output from this shift: Total I/O In: 1600 [IV Piggyback:1600] Out: -   Labs:  Recent Labs  10/01/14 1927 10/02/14 0006  WBC 15.1*  --   HGB 10.9*  --   PLT 234  --   CREATININE  --  1.08   Estimated Creatinine Clearance: 74.3 mL/min (by C-G formula based on Cr of 1.08). No results for input(s): VANCOTROUGH, VANCOPEAK, VANCORANDOM, GENTTROUGH, GENTPEAK, GENTRANDOM, TOBRATROUGH, TOBRAPEAK, TOBRARND, AMIKACINPEAK, AMIKACINTROU, AMIKACIN in the last 72 hours.   Microbiology: No results found for this or any previous visit (from the past 720 hour(s)).  Medical History: Past Medical History  Diagnosis Date  . Iron deficiency anemia   . Status post chemotherapy     4 cycles of Taxotere and cytoxan  . Chronic back pain     "all the way up and down"  . Hx of pulmonary embolus     During c-Section  . Peripheral neuropathic pain   . Obesity     Class 2  . S/P radiation therapy  03/04/2013-04/17/2013    1) Right breast / 50 Gy in 25 fractions/ 2) Right breast boost / 10 Gy in 5 fractions  . Diastolic heart failure   . Hx of radiation therapy 03/04/13- 04/17/13    right breast 50 Gy 25 fractions, right breast boost 10 Gy 5 fractions  . Anxiety   . H/O hiatal hernia   . 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 GAD (generalized anxiety disorder)   . 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 Louretta Shorten  . Lymphedema   . Complication of anesthesia     "I come out really anxious; need Ativan to ease me out" (09/07/2014)  . Hypertension   . Diastolic dysfunction   . Pneumonia     "at least twice/yr" (09/07/2014)  . Chronic bronchitis     "get it q yr" (09/07/2014)  . On home oxygen therapy     "2L just at night" (09/07/2014)  . History of blood transfusion "3-4"    "never can tell why; don't know where the blood was coming from; doesn't show up in stool or urine"  . Daily headache     "recently" (09/07/2014)  . DDD (degenerative disc disease), cervical   . DDD (degenerative disc disease), lumbosacral   . DDD (degenerative disc disease), thoracolumbar   . Fibromyalgia     Medications:  Scheduled:  . anastrozole  1 mg Oral Daily  . atorvastatin  20 mg Oral Daily  . ceFEPime (MAXIPIME) IV  1 g Intravenous 3 times per day  . docusate sodium  100 mg Oral BID  . heparin  5,000 Units Subcutaneous 3 times  per day  . HYDROmorphone HCl  32 mg Oral QHS  . ipratropium  0.5 mg Nebulization Q4H  . levalbuterol  0.63 mg Nebulization Q6H  . lubiprostone  24 mcg Oral BID WC  . methylPREDNISolone (SOLU-MEDROL) injection  60 mg Intravenous Q24H  . vancomycin  750 mg Intravenous NOW   Infusions:  . sodium chloride     Assessment: 11 yoF c/o increased SOB and fever.  Cefepime and Vancomycin per Rx for HCAP.   Goal of Therapy:  Vancomycin trough level 15-20 mcg/ml  Plan:   Vancomycin 1780m x1 ED plus additional 7557m= 250078moad, then 750m88m2h.  Cefepime 1Gm IV q8h  F/U scr/levels/cultures as needed  GreeDorrene German5/2016,2:03 AM

## 2014-10-02 NOTE — H&P (Signed)
[facility] History and Physical  Patient: Alexandria [name]  TDD:[id number]  DOB: 14-Nov-1950  DOS: the patient was seen and examined on 10/01/2014 PCP: Nyoka Cowden, MD  Chief Complaint: Abdominal pain  HPI: Alexandria [name] is a 64 y.o. female with Past medical history of breast cancer, anxiety with depression, peripheral neuropathy, morbid obesity, diastolic heart failure, iron deficiency anemia receiving IV iron, vocal cord dysfunction, recent admission for acute hypoxic failure secondary to acute on chronic heart failure. The patient presented with complaints of abdominal pain. Patient mentions that she started having complaints of abdominal pain starting today on 10/01/2014. Pain is located on her lower abdomen, has no association with food or bowel movement. She has chronic constipation, but mention she has daily bowel movement and denies any diarrhea. Denies any active bleeding. She had an episode of vomiting today and one more episode of vomiting yesterday. She also complains of some fever which she mentions was 102 as well as raised up to 104. She complains of some chills and cough without any expectoration. She denies any chest pain or shortness of breath. Patient initially mentions to EMS that she had shortness of breath and cough as a primary complaint. Patient recently was seen for IV iron transfusion for iron deficiency anemia. In the past patient was recommended to remain on Seroquel which she stopped taking it.  The patient is coming from home And at her baseline independent for most of her ADL.  Review of Systems: as mentioned in the history of present illness.  A Comprehensive review of the other systems is negative.  Past Medical History  Diagnosis Date  . Iron deficiency anemia   . Status post chemotherapy     4 cycles of Taxotere and cytoxan  . Chronic back pain     "all the way up and down"  . Hx of pulmonary embolus     During c-Section  . Peripheral  neuropathic pain   . Obesity     Class 2  . S/P radiation therapy  03/04/2013-04/17/2013    1) Right breast / 50 Gy in 25 fractions/ 2) Right breast boost / 10 Gy in 5 fractions  . Diastolic heart failure   . Hx of radiation therapy 03/04/13- 04/17/13    right breast 50 Gy 25 fractions, right breast boost 10 Gy 5 fractions  . Anxiety   . H/O hiatal hernia   . 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 GAD (generalized anxiety disorder)   . 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 Louretta Shorten  . Lymphedema   . Complication of anesthesia     "I come out really anxious; need Ativan to ease me out" (09/07/2014)  . Hypertension   . Diastolic dysfunction   . Pneumonia     "at least twice/yr" (09/07/2014)  . Chronic bronchitis     "get it q yr" (09/07/2014)  . On home oxygen therapy     "2L just at night" (09/07/2014)  . History of blood transfusion "3-4"    "never can tell why; don't know where the blood was coming from; doesn't show up in stool or urine"  . Daily headache     "recently" (09/07/2014)  . DDD (degenerative disc disease), cervical   . DDD (degenerative disc disease), lumbosacral   . DDD (degenerative disc disease), thoracolumbar   . Fibromyalgia  Past Surgical History  Procedure Laterality Date  . Cesarean section  9326; 1978; 1980;     "stillborn in 62"  . Partial colectomy N/A 07/04/2014    Procedure: REPAIR OF INCARCERATED INCISIONAL HERNIA WITH MESH;  Surgeon: Excell Seltzer, MD;  Location: [facility];  Service: General;  Laterality: N/A;  . Tonsillectomy    . Appendectomy    . Breast biopsy Right   . Breast lumpectomy Right   . Inguinal hernia repair Bilateral     "cancer"  . Umbilical hernia repair  X 2  . Dilation and curettage of uterus  "numerous"  . Abdominal hysterectomy    . Foot surgery Right ~ 2013 X 3    "put pins in but pins kept  breaking"   Social History:  reports that she has never smoked. She has never used smokeless tobacco. She reports that she does not drink alcohol or use illicit drugs.  Allergies  Allergen Reactions  . Contrast Media [Iodinated Diagnostic Agents] Anaphylaxis  . Albuterol Other (See Comments)    Anxious   . Prednisone Other (See Comments)    Pt gets very agitated when she takes high doses of steroids    Family History  Problem Relation Age of Onset  . Parkinson's disease Mother   . Emphysema Maternal Grandfather   . COPD Sister   . CAD Neg Hx     Prior to Admission medications   Medication Sig Start Date End Date Taking? Authorizing Provider  acetaminophen (TYLENOL) 500 MG tablet Take 1,000 mg by mouth every 6 (six) hours as needed for mild pain.   Yes Historical Provider, MD  albuterol (PROVENTIL HFA;VENTOLIN HFA) 108 (90 BASE) MCG/ACT inhaler Inhale 2 puffs into the lungs every 6 (six) hours as needed for wheezing or shortness of breath (wheezing). 07/29/14  Yes Marletta Lor, MD  anastrozole (ARIMIDEX) 1 MG tablet Take 1 mg by mouth daily.   Yes Historical Provider, MD  atorvastatin (LIPITOR) 20 MG tablet Take 1 tablet (20 mg total) by mouth daily. 06/18/14  Yes Marletta Lor, MD  clotrimazole (LOTRIMIN) 1 % cream Apply topically 2 (two) times daily. 09/10/14  Yes Grace Bushy Minor, NP  docusate sodium 100 MG CAPS Take 100 mg by mouth 2 (two) times daily. 07/10/14  Yes Megan N Dort, PA-C  furosemide (LASIX) 80 MG tablet Take 0.5 tablets (40 mg total) by mouth daily. 09/10/14  Yes Grace Bushy Minor, NP  HYDROmorphone (DILAUDID) 4 MG tablet Take 1 tablet (4 mg total) by mouth every 6 (six) hours as needed for severe pain (pain). 07/10/14  Yes Megan N Dort, PA-C  HYDROmorphone HCl 16 MG T24A Take 32 mg by mouth daily.  07/23/14  Yes Historical Provider, MD  ibuprofen (ADVIL,MOTRIN) 200 MG tablet Take 400 mg by mouth every 6 (six) hours as needed for moderate pain.   Yes  Historical Provider, MD  levalbuterol Penne Lash) 0.63 MG/3ML nebulizer solution Take 0.63 mg by nebulization every 4 (four) hours as needed for wheezing or shortness of breath (shortness of breath).   Yes Historical Provider, MD  lidocaine (LIDODERM) 5 % Place 1-2 patches onto the skin daily. Remove & Discard patch within 12 hours or as directed by MD   Yes Historical Provider, MD  lidocaine-prilocaine (EMLA) cream Apply 1 application topically as needed (port access).    Yes Historical Provider, MD  lisinopril (PRINIVIL,ZESTRIL) 5 MG tablet Take 1 tablet (5 mg total) by mouth daily. 09/10/14  Yes Grace Bushy Minor,  NP  LORazepam (ATIVAN) 0.5 MG tablet TAKE 1 TABLET BY MOUTH TWICE DAILY 08/28/14  Yes Marletta Lor, MD  lubiprostone (AMITIZA) 24 MCG capsule Take 1 capsule (24 mcg total) by mouth 2 (two) times daily with a meal. 04/03/14  Yes Heath Lark, MD  Multiple Vitamins-Minerals (HAIR/SKIN/NAILS PO) Take 1 tablet by mouth every morning.   Yes Historical Provider, MD  potassium chloride (KLOR-CON) 20 MEQ packet Take 40 mEq by mouth daily. 09/10/14  Yes Grace Bushy Minor, NP    Physical Exam: Filed Vitals:   10/01/14 2256 10/01/14 2300 10/02/14 0109 10/02/14 0215  BP: 99/78  110/70   Pulse: 130  139 122  Temp:  98.5 F (36.9 C) 98.5 F (36.9 C)   TempSrc:   Oral   Resp: 20  24 22   Height:   5' 8"  (1.727 m)   Weight:   119.1 kg (262 lb 9.1 oz)   SpO2: 94%  92% 94%    General: Alert, Awake and Oriented to Time, Place and Person. Appear in mild distress Eyes: PERRL ENT: Oral Mucosa clear moist. Neck: no JVD Cardiovascular: S1 and S2 Present, no Murmur, Peripheral Pulses Present Respiratory: Bilateral Air entry equal and Decreased, no Crackles, bilateral expiratory wheezes Abdomen: Bowel Sound present, Soft and non tender Skin: no Rash Extremities: no Pedal edema, no calf tenderness Neurologic: Grossly no focal neuro deficit.  Labs on Admission:  CBC:  Recent Labs Lab  09/25/14 1320 10/01/14 1927  WBC 7.3 15.1*  NEUTROABS 5.2 13.1*  HGB 10.5* 10.9*  HCT 33.9* 35.6*  MCV 77.9* 79.5  PLT 222 234    CMP     Component Value Date/Time   NA 136 10/02/2014 0006   NA 142 01/01/2014 1400   K 3.0* 10/02/2014 0006   K 3.2* 01/01/2014 1400   CL 97 10/02/2014 0006   CL 99 03/04/2013 1239   CO2 28 10/02/2014 0006   CO2 30* 01/01/2014 1400   GLUCOSE 121* 10/02/2014 0006   GLUCOSE 132 01/01/2014 1400   GLUCOSE 125* 03/04/2013 1239   BUN 20 10/02/2014 0006   BUN 17.3 01/01/2014 1400   CREATININE 1.08 10/02/2014 0006   CREATININE 1.0 01/01/2014 1400   CALCIUM 8.2* 10/02/2014 0006   CALCIUM 9.8 01/01/2014 1400   PROT 6.6 10/02/2014 0006   PROT 7.7 01/01/2014 1400   ALBUMIN 3.2* 10/02/2014 0006   ALBUMIN 3.7 01/01/2014 1400   AST 22 10/02/2014 0006   AST 32 01/01/2014 1400   ALT 21 10/02/2014 0006   ALT 29 01/01/2014 1400   ALKPHOS 150* 10/02/2014 0006   ALKPHOS 155* 01/01/2014 1400   BILITOT 0.6 10/02/2014 0006   BILITOT 0.25 01/01/2014 1400   GFRNONAA 54* 10/02/2014 0006   GFRAA 63* 10/02/2014 0006    No results for input(s): LIPASE, AMYLASE in the last 168 hours. No results for input(s): AMMONIA in the last 168 hours.   Recent Labs Lab 10/02/14 0006  TROPONINI <0.03   BNP (last 3 results)  Recent Labs  09/07/14 2245  PROBNP 304.7*    Radiological Exams on Admission: Ct Abdomen Pelvis Wo Contrast  10/01/2014   CLINICAL DATA:  Left lower quadrant abdominal pain. Distention. Poor historian. Respiratory distress. Anxious.  EXAM: CT ABDOMEN AND PELVIS WITHOUT CONTRAST  TECHNIQUE: Multidetector CT imaging of the abdomen and pelvis was performed following the standard protocol without IV contrast.  COMPARISON:  07/04/2014  FINDINGS: Lower chest: Patchy infiltrates are identified at the lung bases, right greater than left.  Stable rounded atelectasis ni the right lower lobe. Coronary artery calcifications are present. Moderate hiatal hernia  is noted. No pleural effusions.  Upper abdomen: No focal abnormality identified within the liver, spleen, pancreas, or adrenal glands. Gallbladder is present.  Gastrointestinal tract: Large hiatal hernia. Small bowel loops appear normal. There is moderate stool throughout nondilated loops of colon. Surgical clips are identified in the region of the cecum and what appears to be appendiceal tissue. This may represent an appendiceal stump based on the history of prior appendectomy.  Pelvis: Uterus is absent.  No adnexal mass identified.  Retroperitoneum: No retroperitoneal or mesenteric adenopathy. No evidence for aortic aneurysm.  Abdominal wall: Postoperative changes in the anterior abdominal wall. Small bowel loops are closely apposed to the anterior abdominal wall suggesting possible adhesions.  Osseous structures: Anterolisthesis of L5 on S1, grade 1.  IMPRESSION: 1. Patchy bilateral lower lobe infiltrates, right greater than left. 2. Stable right lower lobe rounded atelectasis. 3. Moderate hiatal hernia, stable. 4. Moderate stool burden.   Electronically Signed   By: Shon Hale M.D.   On: 10/01/2014 21:11   Dg Chest 2 View  10/01/2014   CLINICAL DATA:  Shortness of breath; cough; Per EMS, pt from home. Seen yesterday and d/c for anemia. Pt called EMS twice today and refused transport on first encounter. Pt being noncompliant. Taking off nasal canula even though short of breath. Pt has had increased shortness of breath and fever at home; delay in CXR because patient was refusing  EXAM: CHEST  2 VIEW  COMPARISON:  09/07/2014  FINDINGS: Shallow lung inflation. Heart is mildly enlarged. There are patchy densities at the lung bases consistent with infectious process. Hiatal hernia. No pulmonary edema.  IMPRESSION: 1. Cardiomegaly without edema. 2. Bilateral lower lobe infiltrates. 3. Hiatal hernia.   Electronically Signed   By: Shon Hale M.D.   On: 10/01/2014 20:59    EKG: Independently reviewed. sinus  tachycardia.  Assessment/Plan Principal Problem:   HCAP (healthcare-associated pneumonia) Active Problems:   Malignant neoplasm of upper-outer quadrant of female breast   Chronic back pain   Morbid obesity   Adjustment disorder with mixed anxiety and depressed mood   Chronic constipation   Iron deficiency anemia   Essential hypertension   1. HCAP (healthcare-associated pneumonia) Patient presented with complaints of abdominal pain, shortness of breath, fever. X-ray of the chest is positive for pneumonia and CT of the abdomen also shows the same finding. She is hypoxic and tachycardic. She has chronic lymphedema and has recurrent admissions for shortness of breath and recent VQ scan was negative. With this the patient will currently being admitted in the hospital for healthcare associated pneumonia due to her recent hospitalization. She'll be treated with vancomycin Zosyn. I would also give her nebulizations scheduled and IV Solu-Medrol for possible exacerbation of chronic bronchitis. Follow cultures.  2. Abdominal pain. Patient did have complaint of abdominal pain. CT of the abdomen does not show any acute abnormality and shows moderate stool burden. Probably patient's abdominal pain is secondary to her chronic constipation secondary to chronic narcotics. Currently I'll reduce Dulcolax suppository and continue with stool softeners. Currently she will remain nothing by mouth except medication due to her complaint of abdominal pain as well as nausea and vomiting.  3. Restless leg Possible muscle spasm.  Patient complains of right knee pain. Externally there does not appear any injury or any other functional abnormalities as the patient is able to move the knee without any decrease in  range of motion. I would use Valium for muscle spasm as well as Flexeril. Patient is on oral Dilaudid at home which I would switch to IV Dilaudid. Discontinue lorazepam. Patient required multiple  narcotics overnight due to her chronic complaint of pain. We will closely monitor her situation as well as respiratory status. Patient was informed about limitations of pain control with the use of narcotics. Attempt Toradol 1. X-ray of the knee. Check ESR  4.Generalized anxiety, depression, agitation. Patient was recommended to remain on Seroquel in the past. She is not taking it at present. Currently she is on lorazepam at home which will be switched to Valium.  Advance goals of care discussion: Full code   DVT Prophylaxis: subcutaneous Heparin Nutrition: npo except medication  Disposition: Admitted to inpatient in step-down unit.  Author: Berle Mull, MD [facility] Pager: [phone]  If 7PM-7AM, please contact night-coverage [email or web address] Password [email or web address]

## 2014-10-02 NOTE — Progress Notes (Signed)
eLink Physician-Brief Progress Note Patient Name: Alexandria [name] DOB: Oct 21, 1950 MRN: [id number]   Date of Service  10/02/2014  HPI/Events of Note  Walking around room, crying, in pain? Anxious? Asking for Xanax. Chart reveiwed, HCAP, multiple narcotic meds at home Acute agitation> anxiety? Narcotic related encephalopathy, acute emotional distress?  eICU Interventions  Attempt Xanax May need psyche consult     Intervention Category Minor Interventions: Agitation / anxiety - evaluation and management  Dessie Tatem 10/02/2014, 4:59 AM

## 2014-10-02 NOTE — Progress Notes (Signed)
Shift Event: Pt with increased anxiety/ pain, and constant crying- placed on multiple pain/anxiety regimen per MD. Additionally, K 3.0, wont take PO- ordered 4 runs of IV Adline [facility] [phone]

## 2014-10-02 NOTE — Progress Notes (Signed)
Alexandria [name]   DOB:Dec 30, 1950   ZH#:[id number]   MHD#:[id number]  Patient Care Team: Marletta Lor, MD as PCP - General (Internal Medicine) I have seen the patient, examined her and edited the notes as follows  Subjective: Alexandria [name] 64 year old woman with a history of breast cancer status post chemotherapy and lumpectomy as described below, and anemia for which she received IV iron on 09/30/2014 due to low ferritin. She was admitted on 10/01/2014 -after a recent hospitalization from 12/21 through 09/10/2014 for acute respiratory distress secondary to chronic heart failure, with acute onset of severe lower abdominal pain, vomiting and nausea. She denies any diarrhea or constipation. She also complained of fever up to 104, chills and nonproductive cough. She has some dyspnea, and intermittent nonproductive cough. She denies any bleeding issues such as epistaxis,hematemesis, hematuria or hematochezia. On admission, she did not have any mental status changes, motor or sensory deficits.  A CT of the abdomen and pelvis with contrast 10/01/2014 showed moderate stool burden, which may have been the culprit for her abdominal pain. Incidental patchy bilateral lower lobe infiltrates, right greater than left were seen. This was suspicious for healthcare associated pneumonia. After cultures were obtained, she was initiated on Vancomycin and Zosyn, Nebulizers and IV Solu-Medrol, Oxygen and antipyretics with some improvement of her symptoms.  Today, the patient denies any chest pain, and her respiratory status improved. Her abdominal pain result, she denies any nausea or vomiting. We were kindly informed of the patient's admission.  SUMMARY OF ONCOLOGIC HISTORY:  #1 iron deficiency anemia, recurrent #2 Estrogen receptor positive breast cancer, no evidence of disease #3 large hiatal hernia #4 symptomatic abdominal wall hernia  SUMMARY OF ONCOLOGIC HISTORY:  #1 iron deficiency anemia  She was found to have  abnormal CBC from routine blood work as part of her treatment related to diagnosis of breast cancer.  From June 2014 to present, she was noted to be progressively anemic to the point requiring blood transfusion in February 2015.   The patient had undergone extensive evaluation in [place] prior to moving to [place]. Unfortunately those records are not available for review.According to her, she had EGD and colonoscopy and had received multiple units of blood transfusion in 2014. According to the patient, the hemoglobin dropped to 3 g at some point in time.  She had received blood transfusion twice. With the most recent blood transfusion, according to the patient, she had a transfusion reaction. According to the blood typing on 11/19/2013, warm antibody was detected.  The patient was prescribed oral iron supplements and she takes it 3 times a day with an empty stomach. She received 1 dose of intravenous iron on 12/09/13 and 1 dose on 09/30/14  #2 Estrogen receptor positive breast cancer, no evidence of disease In terms of her breast cancer history, it is summarized as follows. Malignant neoplasm of upper-outer quadrant of female breast  Primary site: Breast (Right)  Staging method: AJCC 7th Edition  Pathologic: Stage IA (T1, N0, cM0) signed by Deatra Robinson, MD on 03/26/2013 8:21 AM  Summary: Stage IA (T1, N0, cM0)  In January 2014 she had a mammogram performed that showed an abnormality in the right breast. This was in [place]. 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 Patient went on to have a right lumpectomy performed revealing a T1cN0 disease stage at diagnosis stage  I. She subsequently had Oncotype DX performed that showed a recurrence score of 23 giving her a 10 year risk for recurrence but 10-30% in the intermediate  risk category. Because of this she received adjuvant chemotherapy consisting of 4 cycles of Taxotere and Cytoxan. She tolerated it well.   She completed adjuvant radiation therapy from 03/04/2013 through 04/17/2013. She started taking Arimidex since 05/16/2013.  Scheduled Meds: . anastrozole  1 mg Oral Daily  . budesonide (PULMICORT) nebulizer solution  0.25 mg Nebulization BID  . ceFEPime (MAXIPIME) IV  1 g Intravenous 3 times per day  . docusate sodium  100 mg Oral BID  . heparin  5,000 Units Subcutaneous 3 times per day  . HYDROmorphone HCl  32 mg Oral QHS  . ipratropium  0.5 mg Nebulization Q6H  . levalbuterol  0.63 mg Nebulization Q6H  . lidocaine  2 patch Transdermal Q24H  . lubiprostone  24 mcg Oral BID WC  . magnesium hydroxide  15 mL Oral Daily  . magnesium sulfate IVPB < 1 g  2 g Intravenous Once  . methylPREDNISolone (SOLU-MEDROL) injection  40 mg Intravenous BID  . pantoprazole  40 mg Oral Daily  . potassium chloride  10 mEq Intravenous Q1 Hr x 4  . rOPINIRole  0.25 mg Oral QHS  . vancomycin  750 mg Intravenous Q12H   Continuous Infusions:  PRN Meds:HYDROmorphone (DILAUDID) injection, levalbuterol, LORazepam, methocarbamol (ROBAXIN)  IV   Objective:  Filed Vitals:   10/02/14 0914  BP: 112/70  Pulse: 86  Temp:   Resp: 18      Intake/Output Summary (Last 24 hours) at 10/02/14 1040 Last data filed at 10/02/14 0754  Gross per 24 hour  Intake   2100 ml  Output    400 ml  Net   1700 ml    ECOG PERFORMANCE STATUS: 2  GENERAL:alert, no distress and somnolent. She is morbidly obese SKIN: skin color, texture, turgor are normal, no rashes or significant lesions EYES: normal, conjunctiva are pale and non-injected, sclera clear OROPHARYNX:no exudate, no erythema and lips, buccal mucosa, and tongue normal  NECK: supple, thyroid normal size, non-tender, without nodularity LYMPH:  no palpable lymphadenopathy in the cervical, axillary or inguinal LUNGS: bilateral  rhonchi and expiratory wheezing, no rales, with normal breathing effort HEART: regular rate & rhythm and no murmurs and 1+ lower extremity edema. She has chronic right lymphedema ABDOMEN: obese, palpable fullness and discomfort in the epigastrium area consistent with known hernia. Lower quadrants non tender. Normal bowel sounds Musculoskeletal:no cyanosis of digits and no clubbing. No cords palpable PSYCH:  Somnolent, but does respond approppiately to questions oriented x 3 with fluent speech NEURO: no focal motor/sensory deficits other than known restless leg syndrome    CBG (last 3)  No results for input(s): GLUCAP in the last 72 hours.   Labs:   Recent Labs Lab 09/25/14 1320 10/01/14 1927 10/02/14 0730  WBC 7.3 15.1* 9.6  HGB 10.5* 10.9* 9.5*  HCT 33.9* 35.6* 30.2*  PLT 222 234 185  MCV 77.9* 79.5 79.7  MCH 24.1* 24.3* 25.1*  MCHC 31.0* 30.6 31.5  RDW 18.4* 19.8* 20.0*  LYMPHSABS 1.3 1.2 0.6*  MONOABS 0.6 0.7 0.1  EOSABS 0.2 0.1 0.0  BASOSABS 0.0 0.0 0.0     Chemistries:    Recent Labs Lab 10/02/14 0006 10/02/14 0730  NA 136 136  K 3.0* 3.2*  CL 97 99  CO2 28 30  GLUCOSE 121* 189*  BUN 20 19  CREATININE 1.08  1.04  CALCIUM 8.2* 8.2*  AST 22 24  ALT 21 22  ALKPHOS 150* 131*  BILITOT 0.6 0.6    GFR Estimated Creatinine Clearance: 77.1 mL/min (by C-G formula based on Cr of 1.04).  Liver Function Tests:  Recent Labs Lab 10/02/14 0006 10/02/14 0730  AST 22 24  ALT 21 22  ALKPHOS 150* 131*  BILITOT 0.6 0.6  PROT 6.6 6.1  ALBUMIN 3.2* 3.0*   No results for input(s): LIPASE, AMYLASE in the last 168 hours. No results for input(s): AMMONIA in the last 168 hours.  Urine Studies     Component Value Date/Time   COLORURINE YELLOW 10/01/2014 1858   APPEARANCEUR CLOUDY* 10/01/2014 1858   LABSPEC 1.012 10/01/2014 1858   PHURINE 6.5 10/01/2014 1858   GLUCOSEU NEGATIVE 10/01/2014 1858   HGBUR NEGATIVE 10/01/2014 1858   BILIRUBINUR NEGATIVE 10/01/2014  1858   KETONESUR NEGATIVE 10/01/2014 1858   PROTEINUR NEGATIVE 10/01/2014 1858   UROBILINOGEN 0.2 10/01/2014 1858   NITRITE NEGATIVE 10/01/2014 1858   LEUKOCYTESUR NEGATIVE 10/01/2014 1858    Coagulation profile  Recent Labs Lab 10/01/14 1927 10/02/14 0730  INR 1.02 1.05    Cardiac Enzymes:  Recent Labs Lab 10/02/14 0006  TROPONINI <0.03   Microbiology Cultures pending   Imaging Studies: I have reviewed the miaging Ct Abdomen Pelvis Wo Contrast  10/01/2014   CLINICAL DATA:  Left lower quadrant abdominal pain. Distention. Poor historian. Respiratory distress. Anxious.  EXAM: CT ABDOMEN AND PELVIS WITHOUT CONTRAST  TECHNIQUE: Multidetector CT imaging of the abdomen and pelvis was performed following the standard protocol without IV contrast.  COMPARISON:  07/04/2014  FINDINGS: Lower chest: Patchy infiltrates are identified at the lung bases, right greater than left. Stable rounded atelectasis Siddh Vandeventer the right lower lobe. Coronary artery calcifications are present. Moderate hiatal hernia is noted. No pleural effusions.  Upper abdomen: No focal abnormality identified within the liver, spleen, pancreas, or adrenal glands. Gallbladder is present.  Gastrointestinal tract: Large hiatal hernia. Small bowel loops appear normal. There is moderate stool throughout nondilated loops of colon. Surgical clips are identified in the region of the cecum and what appears to be appendiceal tissue. This may represent an appendiceal stump based on the history of prior appendectomy.  Pelvis: Uterus is absent.  No adnexal mass identified.  Retroperitoneum: No retroperitoneal or mesenteric adenopathy. No evidence for aortic aneurysm.  Abdominal wall: Postoperative changes in the anterior abdominal wall. Small bowel loops are closely apposed to the anterior abdominal wall suggesting possible adhesions.  Osseous structures: Anterolisthesis of L5 on S1, grade 1.  IMPRESSION: 1. Patchy bilateral lower lobe infiltrates,  right greater than left. 2. Stable right lower lobe rounded atelectasis. 3. Moderate hiatal hernia, stable. 4. Moderate stool burden.   Electronically Signed   By: Shon Hale M.D.   On: 10/01/2014 21:11   Dg Chest 2 View  10/01/2014   CLINICAL DATA:  Shortness of breath; cough; Per EMS, pt from home. Seen yesterday and d/c for anemia. Pt called EMS twice today and refused transport on first encounter. Pt being noncompliant. Taking off nasal canula even though short of breath. Pt has had increased shortness of breath and fever at home; delay in CXR because patient was refusing  EXAM: CHEST  2 VIEW  COMPARISON:  09/07/2014  FINDINGS: Shallow lung inflation. Heart is mildly enlarged. There are patchy densities at the lung bases consistent with infectious process. Hiatal hernia. No pulmonary edema.  IMPRESSION: 1. Cardiomegaly without edema. 2. Bilateral lower lobe  infiltrates. 3. Hiatal hernia.   Electronically Signed   By: Shon Hale M.D.   On: 10/01/2014 20:59   Assessment/Plan: 64 y.o.   Malignant neoplasm of upper-outer quadrant of female breast Clinically, she has no signs of disease recurrence. She will continue taking Arimidex.  Iron deficiency anemia The most likely cause of her anemia is due to chronic blood loss/malabsorption syndrome, in addition to chronic disease, rule out infection. She received IV iron on 09/30/2014 with last Ferritin on 1/9 at 16. New ferritin levels will be ordered prior to next outpatient visit Goal is to keep ferritin level greater than 50 No Transfusion is indicated at this time, as no bleeding issues are present. Her hemoglobin is at 9.5 Plan to give IV iron next week Monday in the hospital  Acute respiratory failure with hypoxia Likely secondary to healthcare associated pneumonia, exacerbation of chronic bronchitis, in the setting of anemia This has improved with steroids, antibiotics, oxygen and nebulizers Appreciate primary team follow-up  Fever Likely  secondary to healthcare associated pneumonia, pain Cultures were drawn The patient was placed on broad-spectrum IV antibiotics with Zosyn and vancomycin, antipyretics Appreciate primary team involvement  Abdominal pain, with nausea and vomiting CT of the abdomen and pelvis was remarkable for significant stool burden in the setting of hiatal hernia And chronic pain medications Her abdominal pain is now controlled. No further nausea or vomiting reported Continue stool softeners, supportive care  DVT prophylaxis On Heparin sq three times daily  Full Code  Other medical issues Including hypokalemia, anxiety, depression, restless leg syndrome, fibromyalgia and hypertension as per admitting team   Will return to follow next week  **Disclaimer: This note was dictated with voice recognition software. Similar sounding words can inadvertently be transcribed and this note may contain transcription errors which may not have been corrected upon publication of note.Sharene Butters E, PA-C 10/02/2014  10:40 AM Demri Poulton, MD 10/02/2014

## 2014-10-03 LAB — CBC
HCT: 32.9 % — ABNORMAL LOW (ref 36.0–46.0)
Hemoglobin: 10.2 g/dL — ABNORMAL LOW (ref 12.0–15.0)
MCH: 25 pg — ABNORMAL LOW (ref 26.0–34.0)
MCHC: 31 g/dL (ref 30.0–36.0)
MCV: 80.6 fL (ref 78.0–100.0)
Platelets: 194 10*3/uL (ref 150–400)
RBC: 4.08 MIL/uL (ref 3.87–5.11)
RDW: 20.3 % — AB (ref 11.5–15.5)
WBC: 10 10*3/uL (ref 4.0–10.5)

## 2014-10-03 LAB — BASIC METABOLIC PANEL
Anion gap: 11 (ref 5–15)
BUN: 20 mg/dL (ref 6–23)
CALCIUM: 9.3 mg/dL (ref 8.4–10.5)
CHLORIDE: 101 meq/L (ref 96–112)
CO2: 28 mmol/L (ref 19–32)
Creatinine, Ser: 0.99 mg/dL (ref 0.50–1.10)
GFR calc Af Amer: 70 mL/min — ABNORMAL LOW (ref >90)
GFR calc non Af Amer: 60 mL/min — ABNORMAL LOW (ref >90)
Glucose, Bld: 165 mg/dL — ABNORMAL HIGH (ref 70–99)
Potassium: 4.1 mmol/L (ref 3.5–5.1)
Sodium: 140 mmol/L (ref 135–145)

## 2014-10-03 MED ORDER — METHOCARBAMOL 500 MG PO TABS
500.0000 mg | ORAL_TABLET | Freq: Three times a day (TID) | ORAL | Status: DC | PRN
  Administered 2014-10-03 – 2014-10-06 (×5): 500 mg via ORAL
  Filled 2014-10-03 (×5): qty 1

## 2014-10-03 MED ORDER — HYDROCORTISONE 20 MG PO TABS: 20.0000 mg | ORAL_TABLET | Freq: Two times a day (BID) | ORAL | Status: DC

## 2014-10-03 MED ORDER — HYDROCORTISONE 20 MG PO TABS
20.0000 mg | ORAL_TABLET | Freq: Every day | ORAL | Status: AC
  Administered 2014-10-03 – 2014-10-04 (×2): 20 mg via ORAL
  Filled 2014-10-03 (×2): qty 1

## 2014-10-03 MED ORDER — SODIUM CHLORIDE 0.9 % IJ SOLN: 10.0000 mL | Freq: Two times a day (BID) | INTRAMUSCULAR | Status: DC

## 2014-10-03 MED ORDER — FENTANYL CITRATE 0.05 MG/ML IJ SOLN
50.0000 ug | Freq: Once | INTRAMUSCULAR | Status: AC
  Administered 2014-10-03: 50 ug via INTRAVENOUS
  Filled 2014-10-03: qty 2

## 2014-10-03 MED ORDER — HYDROMORPHONE HCL ER 8 MG PO T24A
16.0000 mg | EXTENDED_RELEASE_TABLET | Freq: Two times a day (BID) | ORAL | Status: DC
  Administered 2014-10-03 – 2014-10-06 (×7): 16 mg via ORAL
  Filled 2014-10-03 (×7): qty 2

## 2014-10-03 MED ORDER — FUROSEMIDE 20 MG PO TABS
20.0000 mg | ORAL_TABLET | Freq: Every day | ORAL | Status: DC
  Administered 2014-10-03 – 2014-10-05 (×3): 20 mg via ORAL
  Filled 2014-10-03 (×3): qty 1

## 2014-10-03 MED ORDER — HYDROCORTISONE 10 MG PO TABS
10.0000 mg | ORAL_TABLET | Freq: Every day | ORAL | Status: DC
  Administered 2014-10-05 – 2014-10-06 (×2): 10 mg via ORAL
  Filled 2014-10-03 (×2): qty 1

## 2014-10-03 MED ORDER — HYDROCORTISONE 5 MG PO TABS: 5.0000 mg | ORAL_TABLET | Freq: Every day | ORAL | Status: DC

## 2014-10-03 MED ORDER — LORAZEPAM 2 MG/ML IJ SOLN
1.0000 mg | Freq: Three times a day (TID) | INTRAMUSCULAR | Status: DC | PRN
  Administered 2014-10-03 – 2014-10-06 (×8): 1 mg via INTRAVENOUS
  Filled 2014-10-03 (×8): qty 1

## 2014-10-03 MED ORDER — SODIUM CHLORIDE 0.9 % IJ SOLN
10.0000 mL | INTRAMUSCULAR | Status: DC | PRN
  Administered 2014-10-04 – 2014-10-05 (×2): 10 mL
  Filled 2014-10-03 (×2): qty 40

## 2014-10-03 NOTE — Progress Notes (Signed)
[facility] PROGRESS NOTE  Alexandria [name] FFM:[id number] DOB: 22-May-1951 DOA: 10/01/2014 PCP: Nyoka Cowden, MD  Assessment/Plan: 1-acute resp failure with hypoxia: secondary to PNA and exacerbation of chronic bronchitis -will continue pulmicort -start steroids tapering  -continue broad spectrum antibiotics for another day; if remains afebrile and stable will switch to levaquin PO and complete abx therapy -oxygen supplementation as needed with goals for O2 sat > 92% -influenza by PCR neg -PRN antipyretics and supportive care -continue PRN xopenex   2-HTN: BP stable, but has started to rise -will resume lasix at lower dose initially (that will also help with LE edema)  3-anxiety/depression: will continue PRN lorazepam (dose adjusted; as condition might be worse with use of steroids). Patient instructed to use seroquel in the past, but has declined -if needed will ask for psych consult while inpatient -overall mentation is better   4-hypokalemia: repleted -will monitor electrolytes   5-chronic pain: will continue ER dilaudid and IV dilaudid for breakthrough  -will continue robaxin PRN -exalgo adjusted to home regimen -will give one dose of fentanyl and continue dilaudid 4 mg PRN for breakthrough -if responded well to fentanyl, will discussed about adding fentanyl patch.  6-abd pain, N/V: probably associated with PNA. Patient with hx of hiatal hernia -tolerating diet w/o problems -continue PPI -continue PRN antiemetics  7-hx of breast cancer and iron deficiency anemia: continue arimidex  -oncology on board; appreciate rec's -plan is for iron infusion on 10/05/14  Code Status: Full Family Communication: no family at bedside Disposition Plan: remains inpatient; if symptoms continue improving and VS stable will transfer out of stepdown later today (watching resp status closely)   Consultants:  None   Procedures:  See below for x-ray reports    Antibiotics:  vanc  Cefepime   HPI/Subjective: Patient breathing easier and no requiring O2 supplementation all the time. Still with a lot of pain and with crying spells and anxiety.  Objective: Filed Vitals:   10/03/14 0609  BP: 124/86  Pulse: 95  Temp: 97.3 F (36.3 C)  Resp: 18    Intake/Output Summary (Last 24 hours) at 10/03/14 1207 Last data filed at 10/03/14 5701  Gross per 24 hour  Intake    745 ml  Output    800 ml  Net    -55 ml   Filed Weights   10/02/14 0109  Weight: 119.1 kg (262 lb 9.1 oz)    Exam:   General:  Patient is afebrile; complaining of worsening of her chronic pain and also with crying spells/anxious state (difficult to control; most likely from steroids). Breathing is a lot better and just mild exp wheezing on exam.  Cardiovascular: regular rate, no rubs, no gallops, S1 and S2 appreciated  Respiratory: positive rhonchi and mild end exp wheezing appreciated; no crackles  Abdomen: obese, soft, no tenderness, positive BS and no guarding  Musculoskeletal: edema 2++ bilaterally (unchanged from her baseline according to patient; L > R); no cyanosis   Data Reviewed: Basic Metabolic Panel:  Recent Labs Lab 10/02/14 0006 10/02/14 0730 10/03/14 0440  NA 136 136 140  K 3.0* 3.2* 4.1  CL 97 99 101  CO2 28 30 28   GLUCOSE 121* 189* 165*  BUN 20 19 20   CREATININE 1.08 1.04 0.99  CALCIUM 8.2* 8.2* 9.3   Liver Function Tests:  Recent Labs Lab 10/02/14 0006 10/02/14 0730  AST 22 24  ALT 21 22  ALKPHOS 150* 131*  BILITOT 0.6 0.6  PROT 6.6 6.1  ALBUMIN 3.2*  3.0*   CBC:  Recent Labs Lab 10/01/14 1927 10/02/14 0730 10/03/14 0440  WBC 15.1* 9.6 10.0  NEUTROABS 13.1* 8.9*  --   HGB 10.9* 9.5* 10.2*  HCT 35.6* 30.2* 32.9*  MCV 79.5 79.7 80.6  PLT 234 185 194   Cardiac Enzymes:  Recent Labs Lab 10/02/14 0006  TROPONINI <0.03   BNP (last 3 results)  Recent Labs  09/07/14 2245  PROBNP 304.7*   CBG: No results for  input(s): GLUCAP in the last 168 hours.  Recent Results (from the past 240 hour(s))  Culture, blood (routine x 2)     Status: None (Preliminary result)   Collection Time: 10/01/14  7:27 PM  Result Value Ref Range Status   Specimen Description BLOOD R HAND  Final   Special Requests BOTTLES DRAWN AEROBIC AND ANAEROBIC 5ML  Final   Culture   Final           BLOOD CULTURE RECEIVED NO GROWTH TO DATE CULTURE WILL BE HELD FOR 5 DAYS BEFORE ISSUING A FINAL NEGATIVE REPORT Performed at [facility]    Report Status PENDING  Incomplete  Culture, blood (routine x 2)     Status: None (Preliminary result)   Collection Time: 10/01/14  8:01 PM  Result Value Ref Range Status   Specimen Description BLOOD RIGHT ANTECUBITAL  Final   Special Requests BOTTLES DRAWN AEROBIC ONLY 3ML  Final   Culture   Final           BLOOD CULTURE RECEIVED NO GROWTH TO DATE CULTURE WILL BE HELD FOR 5 DAYS BEFORE ISSUING A FINAL NEGATIVE REPORT Performed at [facility]    Report Status PENDING  Incomplete  MRSA PCR Screening     Status: None   Collection Time: 10/02/14  1:12 AM  Result Value Ref Range Status   MRSA by PCR NEGATIVE NEGATIVE Final    Comment:        The GeneXpert MRSA Assay (FDA approved for NASAL specimens only), is one component of a comprehensive MRSA colonization surveillance program. It is not intended to diagnose MRSA infection nor to guide or monitor treatment for MRSA infections.      Studies: Ct Abdomen Pelvis Wo Contrast  10/01/2014   CLINICAL DATA:  Left lower quadrant abdominal pain. Distention. Poor historian. Respiratory distress. Anxious.  EXAM: CT ABDOMEN AND PELVIS WITHOUT CONTRAST  TECHNIQUE: Multidetector CT imaging of the abdomen and pelvis was performed following the standard protocol without IV contrast.  COMPARISON:  07/04/2014  FINDINGS: Lower chest: Patchy infiltrates are identified at the lung bases, right greater than left. Stable rounded atelectasis ni  the right lower lobe. Coronary artery calcifications are present. Moderate hiatal hernia is noted. No pleural effusions.  Upper abdomen: No focal abnormality identified within the liver, spleen, pancreas, or adrenal glands. Gallbladder is present.  Gastrointestinal tract: Large hiatal hernia. Small bowel loops appear normal. There is moderate stool throughout nondilated loops of colon. Surgical clips are identified in the region of the cecum and what appears to be appendiceal tissue. This may represent an appendiceal stump based on the history of prior appendectomy.  Pelvis: Uterus is absent.  No adnexal mass identified.  Retroperitoneum: No retroperitoneal or mesenteric adenopathy. No evidence for aortic aneurysm.  Abdominal wall: Postoperative changes in the anterior abdominal wall. Small bowel loops are closely apposed to the anterior abdominal wall suggesting possible adhesions.  Osseous structures: Anterolisthesis of L5 on S1, grade 1.  IMPRESSION: 1. Patchy bilateral lower lobe  infiltrates, right greater than left. 2. Stable right lower lobe rounded atelectasis. 3. Moderate hiatal hernia, stable. 4. Moderate stool burden.   Electronically Signed   By: Shon Hale M.D.   On: 10/01/2014 21:11   Dg Chest 2 View  10/01/2014   CLINICAL DATA:  Shortness of breath; cough; Per EMS, pt from home. Seen yesterday and d/c for anemia. Pt called EMS twice today and refused transport on first encounter. Pt being noncompliant. Taking off nasal canula even though short of breath. Pt has had increased shortness of breath and fever at home; delay in CXR because patient was refusing  EXAM: CHEST  2 VIEW  COMPARISON:  09/07/2014  FINDINGS: Shallow lung inflation. Heart is mildly enlarged. There are patchy densities at the lung bases consistent with infectious process. Hiatal hernia. No pulmonary edema.  IMPRESSION: 1. Cardiomegaly without edema. 2. Bilateral lower lobe infiltrates. 3. Hiatal hernia.   Electronically Signed    By: Shon Hale M.D.   On: 10/01/2014 20:59    Scheduled Meds: . anastrozole  1 mg Oral Daily  . antiseptic oral rinse  7 mL Mouth Rinse BID  . budesonide (PULMICORT) nebulizer solution  0.25 mg Nebulization BID  . ceFEPime (MAXIPIME) IV  1 g Intravenous 3 times per day  . docusate sodium  100 mg Oral BID  . fentaNYL  50 mcg Intravenous Once  . furosemide  20 mg Oral Daily  . heparin  5,000 Units Subcutaneous 3 times per day  . hydrocortisone  20 mg Oral BID  . HYDROmorphone HCl  16 mg Oral Q12H  . ipratropium  0.5 mg Nebulization Q6H  . levalbuterol  0.63 mg Nebulization Q6H  . lidocaine  2 patch Transdermal Q24H  . lubiprostone  24 mcg Oral BID WC  . magnesium hydroxide  15 mL Oral Daily  . pantoprazole  40 mg Oral Daily  . potassium chloride  40 mEq Oral Daily  . rOPINIRole  0.25 mg Oral QHS  . vancomycin  750 mg Intravenous Q12H   Continuous Infusions:   Principal Problem:   HCAP (healthcare-associated pneumonia) Active Problems:   Malignant neoplasm of upper-outer quadrant of female breast   Chronic back pain   Morbid obesity   Adjustment disorder with mixed anxiety and depressed mood   Chronic constipation   Iron deficiency anemia   Essential hypertension    Time spent: 30 minutes    Barton Dubois  [facility] Pager [phone]. If 7PM-7AM, please contact night-coverage at [email or web address], password [facility] 10/03/2014, 12:07 PM  LOS: 2 days

## 2014-10-03 NOTE — Care Management (Signed)
CARE MANAGEMENT NOTE 10/03/2014  Patient:  Alexandria [name], Alexandria [name]   Account Number:  [id number]  Date Initiated:  10/03/2014  Documentation initiated by:  Apolonio Schneiders  Subjective/Objective Assessment:   acute resp failure with hypoxia     Action/Plan:   Anticipated DC Date:  10/04/2014   Anticipated DC Plan:  HOME/SELF CARE  In-house referral  Clinical Social Worker      DC Planning Services  CM consult      Choice offered to / List presented to:             Status of service:  Completed, signed off Medicare Important Message given?   (If response is "NO", the following Medicare IM given date fields will be blank) Date Medicare IM given:   Medicare IM given by:   Date Additional Medicare IM given:   Additional Medicare IM given by:    Discharge Disposition:    Per UR Regulation:    If discussed at Long Length of Stay Meetings, dates discussed:    Comments:  10/03/14 - 1300 - CM spoke with patient. Patient reports she has difficulty with transportation to get her medications from the pharmacy when her husband is not at home. He is currently out of work. She uses $12 rides at times. States they are sometimes scared of her riding with medical equipment. She would like to discuss transportation with SW. A SW referral is pending. Venita Sheffield RN BSN CCM [phone]

## 2014-10-04 LAB — VANCOMYCIN, TROUGH: Vancomycin Tr: 21.5 ug/mL — ABNORMAL HIGH (ref 10.0–20.0)

## 2014-10-04 MED ORDER — DIPHENHYDRAMINE HCL 25 MG PO CAPS
25.0000 mg | ORAL_CAPSULE | Freq: Once | ORAL | Status: AC
  Administered 2014-10-04: 25 mg via ORAL
  Filled 2014-10-04: qty 1

## 2014-10-04 MED ORDER — FENTANYL CITRATE 0.05 MG/ML IJ SOLN
50.0000 ug | Freq: Once | INTRAMUSCULAR | Status: AC
  Administered 2014-10-04: 50 ug via INTRAVENOUS
  Filled 2014-10-04: qty 2

## 2014-10-04 MED ORDER — DIPHENHYDRAMINE HCL 25 MG PO CAPS
25.0000 mg | ORAL_CAPSULE | Freq: Once | ORAL | Status: AC
  Administered 2014-10-05: 25 mg via ORAL
  Filled 2014-10-04: qty 1

## 2014-10-04 MED ORDER — VANCOMYCIN HCL IN DEXTROSE 750-5 MG/150ML-% IV SOLN: 750.0000 mg | INTRAVENOUS | Status: DC

## 2014-10-04 MED ORDER — LEVOFLOXACIN 750 MG PO TABS
750.0000 mg | ORAL_TABLET | Freq: Every day | ORAL | Status: DC
  Administered 2014-10-05 – 2014-10-06 (×2): 750 mg via ORAL
  Filled 2014-10-04 (×2): qty 1

## 2014-10-04 NOTE — Progress Notes (Signed)
ANTIBIOTIC CONSULT NOTE - FOLLOW UP  Pharmacy Consult for Vancomycin, Cefepime Indication: HCAP  Allergies  Allergen Reactions  . Contrast Media [Iodinated Diagnostic Agents] Anaphylaxis  . Albuterol Other (See Comments)    Anxious   . Prednisone Other (See Comments)    Pt gets very agitated when she takes high doses of steroids    Patient Measurements: Height: 5\' 8"  (172.7 cm) Weight: 262 lb 9.1 oz (119.1 kg) IBW/kg (Calculated) : 63.9  Vital Signs: Temp: 97.9 F (36.6 C) (01/17 0511) Temp Source: Oral (01/17 0511) BP: 97/79 mmHg (01/17 0511) Pulse Rate: 99 (01/17 0511) Intake/Output from previous day: 01/16 0701 - 01/17 0700 In: 100 [P.O.:100] Out: 400 [Urine:400] Intake/Output from this shift: Total I/O In: 80 [P.O.:80] Out: -   Labs:  Recent Labs  10/01/14 1927 10/02/14 0006 10/02/14 0730 10/03/14 0440  WBC 15.1*  --  9.6 10.0  HGB 10.9*  --  9.5* 10.2*  PLT 234  --  185 194  CREATININE  --  1.08 1.04 0.99   Estimated Creatinine Clearance: 81 mL/min (by C-G formula based on Cr of 0.99).  Recent Labs  10/04/14 0900  VANCOTROUGH 21.5*     Microbiology: Recent Results (from the past 720 hour(s))  Culture, blood (routine x 2)     Status: None (Preliminary result)   Collection Time: 10/01/14  7:27 PM  Result Value Ref Range Status   Specimen Description BLOOD R HAND  Final   Special Requests BOTTLES DRAWN AEROBIC AND ANAEROBIC 5ML  Final   Culture   Final           BLOOD CULTURE RECEIVED NO GROWTH TO DATE CULTURE WILL BE HELD FOR 5 DAYS BEFORE ISSUING A FINAL NEGATIVE REPORT Performed at [facility]    Report Status PENDING  Incomplete  Culture, blood (routine x 2)     Status: None (Preliminary result)   Collection Time: 10/01/14  8:01 PM  Result Value Ref Range Status   Specimen Description BLOOD RIGHT ANTECUBITAL  Final   Special Requests BOTTLES DRAWN AEROBIC ONLY 3ML  Final   Culture   Final           BLOOD CULTURE RECEIVED NO  GROWTH TO DATE CULTURE WILL BE HELD FOR 5 DAYS BEFORE ISSUING A FINAL NEGATIVE REPORT Performed at [facility]    Report Status PENDING  Incomplete  MRSA PCR Screening     Status: None   Collection Time: 10/02/14  1:12 AM  Result Value Ref Range Status   MRSA by PCR NEGATIVE NEGATIVE Final    Comment:        The GeneXpert MRSA Assay (FDA approved for NASAL specimens only), is one component of a comprehensive MRSA colonization surveillance program. It is not intended to diagnose MRSA infection nor to guide or monitor treatment for MRSA infections.     Assessment: 64 yo female with a history of breast cancer s/p chemotherapy and lumpectomy who was recently hospitalized in December for acute respiratory distress d/t chronic heart failure is admitted 10/01/14 with HCAP. Pharmacy is consulted to dose vancomycin and cefepime.  1/14 >>zosyn >> 1/14 1/14 >>vancomycin >>  1/15>>cefepime >>  Tmax: Afebrile WBCs: improved to WNL Renal: SCr down 0.99, CrCl 81 ml/min CG, 67 ml/min Normalized  1/14 blood x2: ngtd 1/14 MRSA PCR screen: negative 1/14 sputum: ordered  Drug level / dose changes info: 1/14-1/15: 1750mg  x1 then 750mg  = 2500mg  load then 750mg  IV q12h 1/17 vancomycin trough at 09:30 =  21.5 on 750mg  q12h - change to 750mg  q24h  Goal of Therapy:  Vancomycin trough level 15-20 mcg/ml  Cefepime dose per indication and renal function  Plan:   Decrease vancomycin to 750mg  IV q24h - next dose 1/18 at 10AM  Continue cefepime 1g IV q8h  Plan noted to transition to oral levofloxacin, possibly today  Peggyann Juba, PharmD, BCPS Pager: [phone] 10/04/2014,1:02 PM

## 2014-10-04 NOTE — Progress Notes (Signed)
[facility] PROGRESS NOTE  Alexandria [name] FWY:[id number] DOB: 1951-01-20 DOA: 10/01/2014 PCP: Nyoka Cowden, MD  Assessment/Plan: 1-acute resp failure with hypoxia: secondary to PNA and exacerbation of chronic bronchitis -will continue pulmicort -continue steroids tapering  -will switch to levaquin PO and complete abx therapy -oxygen supplementation as needed with goals for O2 sat > 92% -influenza by PCR neg -PRN antipyretics and supportive care -continue PRN xopenex   2-HTN: BP stable, but has started to rise -will resume lasix at lower dose initially (that will also help with LE edema) -follow VS  3-anxiety/depression: will continue PRN lorazepam (dose adjusted; as condition might be worse with use of steroids). Patient instructed to use seroquel in the past, but has declined -if needed will ask for psych consult while inpatient -overall more calm  4-hypokalemia: repleted -will monitor electrolytes   5-chronic pain: will continue ER dilaudid and IV dilaudid for breakthrough  -will continue robaxin PRN -exalgo adjusted to home regimen -will give one dose of fentanyl and continue dilaudid 4 mg PRN for breakthrough -if responded well to fentanyl, will discussed about adding fentanyl patch.  6-abd pain, N/V: probably associated with PNA. Patient with hx of hiatal hernia -tolerating diet w/o problems -continue PPI -continue PRN antiemetics  7-hx of breast cancer and iron deficiency anemia: continue arimidex  -oncology on board; appreciate rec's -plan is for iron infusion on 10/05/14  Code Status: Full Family Communication: no family at bedside Disposition Plan: remains inpatient; if symptoms continue improving and VS stable will transfer out of stepdown later today (watching resp status closely)   Consultants:  None   Procedures:  See below for x-ray reports   Antibiotics:  vanc  Cefepime   HPI/Subjective: Patient breathing easier and more  calm/less anxious. No CP and no fever. Still with some chronic pain  Objective: Filed Vitals:   10/04/14 0511  BP: 97/79  Pulse: 99  Temp: 97.9 F (36.6 C)  Resp: 16    Intake/Output Summary (Last 24 hours) at 10/04/14 1358 Last data filed at 10/04/14 1016  Gross per 24 hour  Intake     80 ml  Output    400 ml  Net   -320 ml   Filed Weights   10/02/14 0109  Weight: 119.1 kg (262 lb 9.1 oz)    Exam:   General:  Patient is afebrile; feeling better and with some improvement in her chronic pain. She is more calm and less anxious.  Cardiovascular: regular rate, no rubs, no gallops, S1 and S2 appreciated  Respiratory: improve air movement, still with some rhonchi and mild end exp wheezing; no crackles  Abdomen: obese, soft, no tenderness, positive BS and no guarding  Musculoskeletal: edema 2++ bilaterally (unchanged from her baseline according to patient; L > R); no cyanosis   Data Reviewed: Basic Metabolic Panel:  Recent Labs Lab 10/02/14 0006 10/02/14 0730 10/03/14 0440  NA 136 136 140  K 3.0* 3.2* 4.1  CL 97 99 101  CO2 28 30 28   GLUCOSE 121* 189* 165*  BUN 20 19 20   CREATININE 1.08 1.04 0.99  CALCIUM 8.2* 8.2* 9.3   Liver Function Tests:  Recent Labs Lab 10/02/14 0006 10/02/14 0730  AST 22 24  ALT 21 22  ALKPHOS 150* 131*  BILITOT 0.6 0.6  PROT 6.6 6.1  ALBUMIN 3.2* 3.0*   CBC:  Recent Labs Lab 10/01/14 1927 10/02/14 0730 10/03/14 0440  WBC 15.1* 9.6 10.0  NEUTROABS 13.1* 8.9*  --   HGB 10.9*  9.5* 10.2*  HCT 35.6* 30.2* 32.9*  MCV 79.5 79.7 80.6  PLT 234 185 194   Cardiac Enzymes:  Recent Labs Lab 10/02/14 0006  TROPONINI <0.03   BNP (last 3 results)  Recent Labs  09/07/14 2245  PROBNP 304.7*    Recent Results (from the past 240 hour(s))  Culture, blood (routine x 2)     Status: None (Preliminary result)   Collection Time: 10/01/14  7:27 PM  Result Value Ref Range Status   Specimen Description BLOOD R HAND  Final    Special Requests BOTTLES DRAWN AEROBIC AND ANAEROBIC 5ML  Final   Culture   Final           BLOOD CULTURE RECEIVED NO GROWTH TO DATE CULTURE WILL BE HELD FOR 5 DAYS BEFORE ISSUING A FINAL NEGATIVE REPORT Performed at [facility]    Report Status PENDING  Incomplete  Culture, blood (routine x 2)     Status: None (Preliminary result)   Collection Time: 10/01/14  8:01 PM  Result Value Ref Range Status   Specimen Description BLOOD RIGHT ANTECUBITAL  Final   Special Requests BOTTLES DRAWN AEROBIC ONLY 3ML  Final   Culture   Final           BLOOD CULTURE RECEIVED NO GROWTH TO DATE CULTURE WILL BE HELD FOR 5 DAYS BEFORE ISSUING A FINAL NEGATIVE REPORT Performed at [facility]    Report Status PENDING  Incomplete  MRSA PCR Screening     Status: None   Collection Time: 10/02/14  1:12 AM  Result Value Ref Range Status   MRSA by PCR NEGATIVE NEGATIVE Final    Comment:        The GeneXpert MRSA Assay (FDA approved for NASAL specimens only), is one component of a comprehensive MRSA colonization surveillance program. It is not intended to diagnose MRSA infection nor to guide or monitor treatment for MRSA infections.      Studies: No results found.  Scheduled Meds: . anastrozole  1 mg Oral Daily  . antiseptic oral rinse  7 mL Mouth Rinse BID  . budesonide (PULMICORT) nebulizer solution  0.25 mg Nebulization BID  . ceFEPime (MAXIPIME) IV  1 g Intravenous 3 times per day  . docusate sodium  100 mg Oral BID  . fentaNYL  50 mcg Intravenous Once  . furosemide  20 mg Oral Daily  . heparin  5,000 Units Subcutaneous 3 times per day  . [START ON 10/05/2014] hydrocortisone  10 mg Oral Daily   Followed by  . [START ON 10/08/2014] hydrocortisone  5 mg Oral Daily  . HYDROmorphone HCl  16 mg Oral Q12H  . ipratropium  0.5 mg Nebulization Q6H  . levalbuterol  0.63 mg Nebulization Q6H  . [START ON 10/05/2014] levofloxacin  750 mg Oral Daily  . lidocaine  2 patch Transdermal Q24H   . lubiprostone  24 mcg Oral BID WC  . magnesium hydroxide  15 mL Oral Daily  . pantoprazole  40 mg Oral Daily  . potassium chloride  40 mEq Oral Daily  . rOPINIRole  0.25 mg Oral QHS  . sodium chloride  10-40 mL Intracatheter Q12H   Continuous Infusions:   Principal Problem:   HCAP (healthcare-associated pneumonia) Active Problems:   Malignant neoplasm of upper-outer quadrant of female breast   Chronic back pain   Morbid obesity   Adjustment disorder with mixed anxiety and depressed mood   Chronic constipation   Iron deficiency anemia   Essential  hypertension    Time spent: 30 minutes    Barton Dubois  [facility] Pager [phone]. If 7PM-7AM, please contact night-coverage at [email or web address], password [facility] 10/04/2014, 1:58 PM  LOS: 3 days

## 2014-10-05 ENCOUNTER — Telehealth: Payer: Self-pay | Admitting: Hematology and Oncology

## 2014-10-05 ENCOUNTER — Telehealth: Payer: Self-pay | Admitting: Internal Medicine

## 2014-10-05 ENCOUNTER — Ambulatory Visit: Payer: Self-pay | Admitting: Internal Medicine

## 2014-10-05 LAB — BASIC METABOLIC PANEL
ANION GAP: 6 (ref 5–15)
BUN: 15 mg/dL (ref 6–23)
CALCIUM: 8.4 mg/dL (ref 8.4–10.5)
CO2: 29 mmol/L (ref 19–32)
CREATININE: 0.96 mg/dL (ref 0.50–1.10)
Chloride: 105 mEq/L (ref 96–112)
GFR calc Af Amer: 72 mL/min — ABNORMAL LOW (ref >90)
GFR calc non Af Amer: 63 mL/min — ABNORMAL LOW (ref >90)
Glucose, Bld: 123 mg/dL — ABNORMAL HIGH (ref 70–99)
Potassium: 3.1 mmol/L — ABNORMAL LOW (ref 3.5–5.1)
SODIUM: 140 mmol/L (ref 135–145)

## 2014-10-05 LAB — LEGIONELLA ANTIGEN, URINE

## 2014-10-05 MED ORDER — LEVALBUTEROL HCL 0.63 MG/3ML IN NEBU: 0.6300 mg | INHALATION_SOLUTION | Freq: Four times a day (QID) | RESPIRATORY_TRACT | Status: DC | PRN

## 2014-10-05 MED ORDER — SODIUM CHLORIDE 0.9 % IV SOLN
510.0000 mg | Freq: Once | INTRAVENOUS | Status: AC
  Administered 2014-10-05: 510 mg via INTRAVENOUS
  Filled 2014-10-05: qty 17

## 2014-10-05 MED ORDER — FUROSEMIDE 40 MG PO TABS
40.0000 mg | ORAL_TABLET | Freq: Every day | ORAL | Status: DC
  Administered 2014-10-06: 40 mg via ORAL
  Filled 2014-10-05: qty 1

## 2014-10-05 MED ORDER — POTASSIUM CHLORIDE CRYS ER 20 MEQ PO TBCR
40.0000 meq | EXTENDED_RELEASE_TABLET | Freq: Two times a day (BID) | ORAL | Status: DC
  Administered 2014-10-05 – 2014-10-06 (×2): 40 meq via ORAL
  Filled 2014-10-05 (×3): qty 2

## 2014-10-05 MED ORDER — DIPHENHYDRAMINE HCL 25 MG PO CAPS
25.0000 mg | ORAL_CAPSULE | Freq: Three times a day (TID) | ORAL | Status: DC | PRN
  Administered 2014-10-05: 25 mg via ORAL
  Filled 2014-10-05: qty 1

## 2014-10-05 MED ORDER — IPRATROPIUM BROMIDE 0.02 % IN SOLN: 0.5000 mg | Freq: Four times a day (QID) | RESPIRATORY_TRACT | Status: DC | PRN

## 2014-10-05 NOTE — Progress Notes (Signed)
CARE MANAGEMENT NOTE 10/05/2014  Patient:  Alexandria [name], Alexandria [name]   Account Number:  [id number]  Date Initiated:  10/03/2014  Documentation initiated by:  Apolonio Schneiders  Subjective/Objective Assessment:   acute resp failure with hypoxia     Action/Plan:   Anticipated DC Date:  10/04/2014   Anticipated DC Plan:  HOME/SELF CARE  In-house referral  Clinical Social Worker      DC Planning Services  CM consult      Choice offered to / List presented to:             Status of service:  Completed, signed off Medicare Important Message given?   (If response is "NO", the following Medicare IM given date fields will be blank) Date Medicare IM given:   Medicare IM given by:   Date Additional Medicare IM given:   Additional Medicare IM given by:    Discharge Disposition:    Per UR Regulation:    If discussed at Long Length of Stay Meetings, dates discussed:    Comments:  10/05/14 Edwyna Shell RN BSN CM [phone] Provided patient with a list of pharmacies that deliver for a small fee of $2-3 and patient said that she can afford that and will switch to a pharmacy that delivers. She also requested help with bathing so provided patient with a private duty agency list. She states that she uses a cane and has a nebulizer machine at home.  10/03/14 - 1300 - CM spoke with patient. Patient reports she has difficulty with transportation to get her medications from the pharmacy when her husband is not at home. He is currently out of work. She uses $12 rides at times. States they are sometimes scared of her riding with medical equipment. She would like to discuss transportation with SW. A SW referral is pending. Venita Sheffield RN BSN CCM [phone]

## 2014-10-05 NOTE — Progress Notes (Signed)
[facility] PROGRESS NOTE  MYLEE FALIN AST:[id number] DOB: 02/05/1951 DOA: 10/01/2014 PCP: Nyoka Cowden, MD  Assessment/Plan: 1-acute resp failure with hypoxia: secondary to PNA and exacerbation of chronic bronchitis -will continue pulmicort -continue steroids tapering  -will switch to levaquin PO and complete abx therapy -oxygen supplementation as needed with goals for O2 sat > 92% -influenza by PCR neg -PRN antipyretics and supportive care -continue PRN xopenex   2-HTN: BP stable, but has started to rise -will resume lasix at lower dose initially (that will also help with LE edema) -follow VS  3-anxiety/depression: will continue PRN lorazepam (dose adjusted; as condition might be worse with use of steroids). Patient instructed to use seroquel in the past, but has declined -if needed will ask for psych consult while inpatient -overall more calm  4-hypokalemia: repleted -will monitor electrolytes   5-chronic pain: will continue ER dilaudid and IV dilaudid for breakthrough  -will continue robaxin PRN -exalgo adjusted to home regimen -will give one dose of fentanyl and continue dilaudid 4 mg PRN for breakthrough -if responded well to fentanyl, will discussed about adding fentanyl patch.  6-abd pain, N/V: probably associated with PNA. Patient with hx of hiatal hernia -tolerating diet w/o problems -continue PPI -continue PRN antiemetics -continue treatment for PNA now by mouth  7-hx of breast cancer and iron deficiency anemia: continue arimidex  -oncology on board; appreciate rec's -plan is for iron infusion on 10/05/14  8-hypokalemia: due to diuresis -will replete as needed  Code Status: Full Family Communication: no family at bedside Disposition Plan: home in am, receiving IV iron infusion today (1/17)   Consultants:  None   Procedures:  See below for x-ray reports   Antibiotics:  vanc  Cefepime   HPI/Subjective: Patient breathing easier  and without fever. No CP. Still with some chronic pain but better control she expressed   Objective: Filed Vitals:   10/05/14 1400  BP: 164/83  Pulse: 87  Temp: 97.7 F (36.5 C)  Resp: 18    Intake/Output Summary (Last 24 hours) at 10/05/14 1619 Last data filed at 10/04/14 1816  Gross per 24 hour  Intake      0 ml  Output      0 ml  Net      0 ml   Filed Weights   10/02/14 0109  Weight: 119.1 kg (262 lb 9.1 oz)    Exam:   General:  Patient is afebrile; feeling better and with some improvement in her chronic pain. Anxiety level is also better.  Cardiovascular: regular rate, no rubs, no gallops, S1 and S2 appreciated  Respiratory: improve air movement, still with some rhonchi and mild end exp wheezing; no crackles  Abdomen: obese, soft, no tenderness, positive BS and no guarding  Musculoskeletal: edema 2++ bilaterally (unchanged from her baseline according to patient; L > R); no cyanosis   Data Reviewed: Basic Metabolic Panel:  Recent Labs Lab 10/02/14 0006 10/02/14 0730 10/03/14 0440 10/05/14 0322  NA 136 136 140 140  K 3.0* 3.2* 4.1 3.1*  CL 97 99 101 105  CO2 28 30 28 29   GLUCOSE 121* 189* 165* 123*  BUN 20 19 20 15   CREATININE 1.08 1.04 0.99 0.96  CALCIUM 8.2* 8.2* 9.3 8.4   Liver Function Tests:  Recent Labs Lab 10/02/14 0006 10/02/14 0730  AST 22 24  ALT 21 22  ALKPHOS 150* 131*  BILITOT 0.6 0.6  PROT 6.6 6.1  ALBUMIN 3.2* 3.0*   CBC:  Recent Labs Lab  10/01/14 1927 10/02/14 0730 10/03/14 0440  WBC 15.1* 9.6 10.0  NEUTROABS 13.1* 8.9*  --   HGB 10.9* 9.5* 10.2*  HCT 35.6* 30.2* 32.9*  MCV 79.5 79.7 80.6  PLT 234 185 194   Cardiac Enzymes:  Recent Labs Lab 10/02/14 0006  TROPONINI <0.03   BNP (last 3 results)  Recent Labs  09/07/14 2245  PROBNP 304.7*    Recent Results (from the past 240 hour(s))  Culture, blood (routine x 2)     Status: None (Preliminary result)   Collection Time: 10/01/14  7:27 PM  Result Value  Ref Range Status   Specimen Description BLOOD R HAND  Final   Special Requests BOTTLES DRAWN AEROBIC AND ANAEROBIC 5ML  Final   Culture   Final           BLOOD CULTURE RECEIVED NO GROWTH TO DATE CULTURE WILL BE HELD FOR 5 DAYS BEFORE ISSUING A FINAL NEGATIVE REPORT Performed at [facility]    Report Status PENDING  Incomplete  Culture, blood (routine x 2)     Status: None (Preliminary result)   Collection Time: 10/01/14  8:01 PM  Result Value Ref Range Status   Specimen Description BLOOD RIGHT ANTECUBITAL  Final   Special Requests BOTTLES DRAWN AEROBIC ONLY 3ML  Final   Culture   Final           BLOOD CULTURE RECEIVED NO GROWTH TO DATE CULTURE WILL BE HELD FOR 5 DAYS BEFORE ISSUING A FINAL NEGATIVE REPORT Performed at [facility]    Report Status PENDING  Incomplete  MRSA PCR Screening     Status: None   Collection Time: 10/02/14  1:12 AM  Result Value Ref Range Status   MRSA by PCR NEGATIVE NEGATIVE Final    Comment:        The GeneXpert MRSA Assay (FDA approved for NASAL specimens only), is one component of a comprehensive MRSA colonization surveillance program. It is not intended to diagnose MRSA infection nor to guide or monitor treatment for MRSA infections.      Studies: No results found.  Scheduled Meds: . anastrozole  1 mg Oral Daily  . antiseptic oral rinse  7 mL Mouth Rinse BID  . budesonide (PULMICORT) nebulizer solution  0.25 mg Nebulization BID  . docusate sodium  100 mg Oral BID  . furosemide  20 mg Oral Daily  . heparin  5,000 Units Subcutaneous 3 times per day  . hydrocortisone  10 mg Oral Daily   Followed by  . [START ON 10/08/2014] hydrocortisone  5 mg Oral Daily  . HYDROmorphone HCl  16 mg Oral Q12H  . ipratropium  0.5 mg Nebulization Q6H  . levalbuterol  0.63 mg Nebulization Q6H  . levofloxacin  750 mg Oral Daily  . lidocaine  2 patch Transdermal Q24H  . lubiprostone  24 mcg Oral BID WC  . magnesium hydroxide  15 mL Oral Daily   . pantoprazole  40 mg Oral Daily  . potassium chloride  40 mEq Oral Daily  . rOPINIRole  0.25 mg Oral QHS  . sodium chloride  10-40 mL Intracatheter Q12H   Continuous Infusions:   Principal Problem:   HCAP (healthcare-associated pneumonia) Active Problems:   Malignant neoplasm of upper-outer quadrant of female breast   Chronic back pain   Morbid obesity   Adjustment disorder with mixed anxiety and depressed mood   Chronic constipation   Iron deficiency anemia   Essential hypertension    Time spent:  30 minutes    Barton Dubois  [facility] Pager [phone]. If 7PM-7AM, please contact night-coverage at [email or web address], password [facility] 10/05/2014, 4:19 PM  LOS: 4 days

## 2014-10-05 NOTE — Progress Notes (Signed)
Patient has refused scheduled nebulizer medications for several days, taking only two over the coarse of the last 48 hours. She states the medication makes her cough more violently and more frequently and therefore wishes not to take it. She denies SOB or dyspnea at this time. RT protocol assessment complete. Score 6. Order changed to prn per patient request.

## 2014-10-05 NOTE — Telephone Encounter (Signed)
Noted  

## 2014-10-05 NOTE — Progress Notes (Signed)
Alexandria [name]   DOB:07-14-1951   CL#:[id number]    Subjective: She is doing better. Shortness of breath and cough has improved. The patient denies any recent signs or symptoms of bleeding such as spontaneous epistaxis, hematuria or hematochezia.   Objective:  Filed Vitals:   10/05/14 0640  BP: 123/62  Pulse: 88  Temp: 98.2 F (36.8 C)  Resp: 18     Intake/Output Summary (Last 24 hours) at 10/05/14 4944 Last data filed at 10/04/14 1816  Gross per 24 hour  Intake     80 ml  Output      0 ml  Net     80 ml    GENERAL:alert, no distress and comfortable SKIN: skin color, texture, turgor are normal, no rashes or significant lesions EYES: normal, Conjunctiva are pink and non-injected, sclera clear OROPHARYNX:no exudate, no erythema and lips, buccal mucosa, and tongue normal  NECK: supple, thyroid normal size, non-tender, without nodularity LYMPH:  no palpable lymphadenopathy in the cervical, axillary or inguinal LUNGS: clear to auscultation and percussion with normal breathing effort HEART: regular rate & rhythm and no murmurs and no lower extremity edema ABDOMEN:abdomen soft, non-tender and normal bowel sounds Musculoskeletal:no cyanosis of digits and no clubbing  NEURO: alert & oriented x 3 with fluent speech, no focal motor/sensory deficits   Labs:  Lab Results  Component Value Date   WBC 10.0 10/03/2014   HGB 10.2* 10/03/2014   HCT 32.9* 10/03/2014   MCV 80.6 10/03/2014   PLT 194 10/03/2014   NEUTROABS 8.9* 10/02/2014    Lab Results  Component Value Date   NA 140 10/05/2014   K 3.1* 10/05/2014   CL 105 10/05/2014   CO2 29 10/05/2014   Assessment & Plan:   Malignant neoplasm of upper-outer quadrant of female breast Clinically, she has no signs of disease recurrence. She will continue taking Arimidex.  Iron deficiency anemia The most likely cause of her anemia is due to chronic blood loss/malabsorption syndrome, in addition to chronic disease, rule out  infection. She received IV iron on 09/30/2014 with last Ferritin on 1/9 at 16. New ferritin levels will be ordered prior to next outpatient visit Goal is to keep ferritin level greater than 50 No Transfusion is indicated at this time, as no bleeding issues are present. Her hemoglobin is at 10.2 Plan to give IV iron today  Acute respiratory failure with hypoxia Likely secondary to healthcare associated pneumonia, exacerbation of chronic bronchitis, in the setting of anemia This has improved with steroids, antibiotics, oxygen and nebulizers Appreciate primary team follow-up  Fever, resolved Likely secondary to healthcare associated pneumonia, pain Cultures were drawn The patient was placed on broad-spectrum IV antibiotics with Zosyn and vancomycin, antipyretics Appreciate primary team involvement  Abdominal pain, with nausea and vomiting, resolved CT of the abdomen and pelvis was remarkable for significant stool burden in the setting of hiatal hernia And chronic pain medications Her abdominal pain is now controlled. No further nausea or vomiting reported Continue stool softeners, supportive care  DVT prophylaxis On Heparin sq three times daily  Full Code  Other medical issues Including hypokalemia, anxiety, depression, restless leg syndrome, fibromyalgia and hypertension as per admitting team  Will sign off. I will set up return appointment in my office next month, tentatively on 11/10/2014 to follow-up on her multiple issues.   [place], [place], MD 10/05/2014  8:29 AM

## 2014-10-05 NOTE — Telephone Encounter (Signed)
FYI pt was sch for post hosp follow up today and had to cancel. Pt is back in [place] [facility]

## 2014-10-05 NOTE — Telephone Encounter (Signed)
s.w. pt husband and advised on Feb appt....ok and aware....mailed pt appt sched and letter

## 2014-10-05 NOTE — Progress Notes (Signed)
Clinical Social Work Department BRIEF PSYCHOSOCIAL ASSESSMENT 10/05/2014  Patient:  Alexandria [name], Alexandria [name]     Account Number:  [id number]     Admit date:  10/01/2014  Clinical Social Worker:  Earlie Server  Date/Time:  10/05/2014 12:15 PM  Referred by:  Care Management  Date Referred:  10/05/2014 Referred for  Transportation assistance   Other Referral:   Interview type:  Patient Other interview type:    PSYCHOSOCIAL DATA Living Status:  FAMILY Admitted from facility:   Level of care:   Primary support name:  Peterson Ao Primary support relationship to patient:  SPOUSE Degree of support available:   Adequate    CURRENT CONCERNS Current Concerns  Other - See comment   Other Concerns:   Transportation    SOCIAL WORK ASSESSMENT / PLAN CSW received referral in order to assist with transportation questions. CSW reviewed chart and met with patient at bedside. CSW introduced myself and explained role.    Patient reports she and husband moved from [place] last year. Patient's husband had lost his job and moved to [facility] but lost his job here as well. Patient reports that husband has been searching for jobs and she has had to miss several appointments due to no transportation. Patient has a car at home but reports she is not stable enough to drive herself around to appointments. Patient does not live on the bus line and has been using taxis. Patient has used private caregivers in the past but reports she cannot afford to pay privately for transportation now.    CSW spoke with patient about completing SCAT application. CSW provided SCAT application and completed professional part B for application. Patient reports she wants to review application and will complete forms when she is feeling better.    Patient reports no further concerns. CSW is signing off but available if further needs arise.   Assessment/plan status:  Referral to [employer] Other assessment/ plan:   Information/referral  to community resources:   SCAT application    PATIENT'S/FAMILY'S RESPONSE TO PLAN OF CARE: Patient alert and oriented. Patient reports that her husband is as supportive as he can be but that he is trying to find a job so there are times that he is not at home in order to provide transportation to doctor appointments. Patient reports that she is stressed out about paying privately but is hopeful that she will be approved for SCAT since it is cheaper than a taxi. Patient thanked CSW for assistance but reports no further concerns at this time.       [place], [place] [phone]

## 2014-10-06 MED ORDER — PANTOPRAZOLE SODIUM 40 MG PO TBEC: 40.0000 mg | DELAYED_RELEASE_TABLET | Freq: Every day | ORAL | Status: DC

## 2014-10-06 MED ORDER — LORAZEPAM 0.5 MG PO TABS: 0.5000 mg | ORAL_TABLET | Freq: Two times a day (BID) | ORAL | Status: DC

## 2014-10-06 MED ORDER — HEPARIN SOD (PORK) LOCK FLUSH 100 UNIT/ML IV SOLN
500.0000 [IU] | INTRAVENOUS | Status: DC
  Filled 2014-10-06: qty 5

## 2014-10-06 MED ORDER — SODIUM CHLORIDE 0.9 % IJ SOLN
10.0000 mL | INTRAMUSCULAR | Status: DC | PRN
  Administered 2014-10-06: 10 mL
  Filled 2014-10-06: qty 40

## 2014-10-06 MED ORDER — LEVALBUTEROL TARTRATE 45 MCG/ACT IN AERO: 1.0000 | INHALATION_SPRAY | Freq: Four times a day (QID) | RESPIRATORY_TRACT | Status: DC | PRN

## 2014-10-06 MED ORDER — IPRATROPIUM-ALBUTEROL 0.5-2.5 (3) MG/3ML IN SOLN: 3.0000 mL | Freq: Four times a day (QID) | RESPIRATORY_TRACT | Status: DC | PRN

## 2014-10-06 MED ORDER — HEPARIN SOD (PORK) LOCK FLUSH 100 UNIT/ML IV SOLN
500.0000 [IU] | INTRAVENOUS | Status: DC | PRN
  Administered 2014-10-06: 500 [IU]
  Filled 2014-10-06 (×2): qty 5

## 2014-10-06 MED ORDER — HYDROCORTISONE 10 MG PO TABS: ORAL_TABLET | ORAL | Status: DC

## 2014-10-06 MED ORDER — SODIUM CHLORIDE 0.9 % IJ SOLN
10.0000 mL | Freq: Two times a day (BID) | INTRAMUSCULAR | Status: DC
  Administered 2014-10-06: 10 mL

## 2014-10-06 MED ORDER — LEVOFLOXACIN 750 MG PO TABS
750.0000 mg | ORAL_TABLET | Freq: Every day | ORAL | Status: DC
Start: 2014-10-06 — End: 2014-10-19

## 2014-10-06 NOTE — Discharge Summary (Signed)
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 Will follow with PCP i 10 days and with oncology service in 2 weeks or so (Cancer center will contact patient with appointment details)  Diet recommendation: low sodium/heart healthy diet  Filed Weights   10/02/14 0109  Weight: 119.1 kg (262 lb 9.1 oz)    History of present illness:  64 y.o. female with Past medical history of breast cancer, anxiety with depression, peripheral neuropathy, morbid obesity, diastolic heart failure, iron deficiency anemia receiving IV iron, vocal cord dysfunction, recent admission for acute hypoxic failure secondary to acute on chronic heart failure. Admitted with SOB, cough, fever and N/V. Found to have PNA.  Hospital Course:  1-acute resp failure with hypoxia: secondary to PNA and exacerbation of chronic bronchitis -will discharge on steroids tapering (will use hydrocortisone as patient reports allergic reaction to prednisone) -continue antibiotics and PRN nebulizer treatment. -influenza by PCR neg -PRN antipyretics using tylenol -follow up with PCP in 10 days  2-HTN: BP stable. -advise to follow low sodium diet -continue  home antihypertensive regimen  3-anxiety/depression: will continue PRN lorazepam  -will benefit of psychiatry examination in outpatient setting for long term medication adjustment/titration   4-hypokalemia: repleted -will recommend monitor electrolytes with BMET during follow up visit  5-chronic pain: will continue ER dilaudid and PO dilaudid for breakthrough  -patient with worsening bilateral OA; will benefit of pain medication adjustment or initiation of low dose fentanyl patch as co-adjuvant regimen -patient to continue pain clinic management for further decisions.  6-abd pain, N/V: probably associated with PNA. Patient also with hx of hiatal hernia and chronic constipation -tolerating diet w/o problems at discharge -started on PPI -no Nausea or vomiting -continue treatment for PNA  -patient advise to increase fiber in her diet, maintain good hydration and continue using PRN laxatives  7-hx of breast cancer and iron deficiency anemia: continue arimidex  -oncology on board; appreciate rec's -plan received iron infusion on 10/05/14 -will be follow as an outpatient at the cancer center -Hgb stable at discharge; no PRBC's transfusion needed  8-hypokalemia: due to diuresis -repleted -will discharge on maintenance dose of potassium  Procedures:  See below for x-ray reports   Consultations:  Oncology   Discharge Exam: Filed Vitals:   10/06/14 0442  BP: 135/86  Pulse: 126  Temp: 98.2 F (36.8 C)  Resp: 22     General: Patient is afebrile; feeling better and with some improvement in her chronic pain. Anxiety level is also a lot better.  Cardiovascular: regular rate, no rubs, no gallops, S1 and S2 appreciated  Respiratory: improve air movement, still with some rhonchi at bases, but no wheezing or crackles  Abdomen: obese, soft, no tenderness, positive BS and no guarding  Musculoskeletal: edema 1-2++ bilaterally (unchanged from her baseline according to patient; L >  R); no cyanosis  Discharge Instructions  Discharge Instructions    Diet - low sodium heart healthy    Complete by:  As directed      Discharge instructions    Complete by:  As directed   Take medications as prescribed Follow with PCP in 10 days Follow a low sodium heart healthy diet. Maintain adequate hydration Arrange follow up with pain clinic for medication adjustments/refills as needed          Current Discharge Medication List    START taking these medications   Details  hydrocortisone (CORTEF) 10 MG tablet Take 1 tablet by mouth daily X 2 days; then take 1/2 tablet by mouth daily X 3 days and stop hydrocortisone Qty: 5 tablet, Refills: 0    ipratropium-albuterol (DUONEB) 0.5-2.5 (3) MG/3ML SOLN Take 3 mLs by nebulization every 6 (six) hours as needed. Qty: 360 mL, Refills: 2    levalbuterol (XOPENEX HFA) 45 MCG/ACT inhaler Inhale 1 puff into the lungs every 6 (six) hours as needed for wheezing or shortness of breath. Qty: 1 Inhaler, Refills: 3    levofloxacin (LEVAQUIN) 750 MG tablet Take 1 tablet (750 mg total) by mouth daily. Qty: 5 tablet, Refills: 0    pantoprazole (PROTONIX) 40 MG tablet Take 1 tablet (40 mg total) by mouth daily. Qty: 30 tablet, Refills: 0      CONTINUE these medications which have CHANGED   Details  LORazepam (ATIVAN) 0.5 MG tablet Take 1 tablet (0.5 mg total) by mouth 2 (two) times daily. Qty: 30 tablet, Refills: 0      CONTINUE these medications which have NOT CHANGED   Details  acetaminophen (TYLENOL) 500 MG tablet Take 1,000 mg by mouth every 6 (six) hours as needed for mild pain.    anastrozole (ARIMIDEX) 1 MG tablet Take 1 mg by mouth daily.    atorvastatin (LIPITOR) 20 MG tablet Take 1 tablet (20 mg total) by mouth daily. Qty: 90 tablet, Refills: 3    clotrimazole (LOTRIMIN) 1 % cream Apply topically 2 (two) times daily. Qty: 30 g, Refills: 0    docusate sodium 100 MG CAPS Take 100 mg by mouth 2 (two) times daily. Qty: 10  capsule, Refills: 0    furosemide (LASIX) 80 MG tablet Take 0.5 tablets (40 mg total) by mouth daily.    HYDROmorphone (DILAUDID) 4 MG tablet Take 1 tablet (4 mg total) by mouth every 6 (six) hours as needed for severe pain (pain). Qty: 40 tablet, Refills: 0    HYDROmorphone HCl 16 MG T24A Take 32 mg by mouth daily.  Refills: 0    ibuprofen (ADVIL,MOTRIN) 200 MG tablet Take 400 mg by mouth every 6 (six) hours as needed for moderate pain.    lidocaine (LIDODERM) 5 % Place 1-2 patches onto the skin daily. Remove & Discard patch within 12 hours or as directed by MD    lidocaine-prilocaine (EMLA) cream Apply 1 application topically as needed (port access).     lisinopril (PRINIVIL,ZESTRIL) 5 MG tablet Take 1 tablet (5 mg total) by mouth daily. Qty: 30 tablet, Refills: 1    lubiprostone (AMITIZA) 24 MCG capsule Take 1 capsule (24 mcg total) by mouth 2 (two) times daily with a meal. Qty: 60 capsule, Refills: 0    Multiple Vitamins-Minerals (HAIR/SKIN/NAILS PO) Take 1 tablet by mouth every morning.    potassium chloride (KLOR-CON) 20 MEQ packet Take 40 mEq by mouth daily.      STOP taking these medications     albuterol (PROVENTIL HFA;VENTOLIN HFA) 108 (90  BASE) MCG/ACT inhaler      levalbuterol (XOPENEX) 0.63 MG/3ML nebulizer solution        Allergies  Allergen Reactions  . Contrast Media [Iodinated Diagnostic Agents] Anaphylaxis  . Albuterol Other (See Comments)    Anxious   . Prednisone Other (See Comments)    Pt gets very agitated when she takes high doses of steroids   Follow-up Information    Follow up with Nyoka Cowden, MD. Schedule an appointment as soon as possible for a visit in 10 days.   Specialty:  Internal Medicine   Contact information:   [place] [id number] [phone]       The results of significant diagnostics from this hospitalization (including imaging, microbiology, ancillary and laboratory) are listed below for  reference.    Significant Diagnostic Studies: Ct Abdomen Pelvis Wo Contrast  10/01/2014   CLINICAL DATA:  Left lower quadrant abdominal pain. Distention. Poor historian. Respiratory distress. Anxious.  EXAM: CT ABDOMEN AND PELVIS WITHOUT CONTRAST  TECHNIQUE: Multidetector CT imaging of the abdomen and pelvis was performed following the standard protocol without IV contrast.  COMPARISON:  07/04/2014  FINDINGS: Lower chest: Patchy infiltrates are identified at the lung bases, right greater than left. Stable rounded atelectasis ni the right lower lobe. Coronary artery calcifications are present. Moderate hiatal hernia is noted. No pleural effusions.  Upper abdomen: No focal abnormality identified within the liver, spleen, pancreas, or adrenal glands. Gallbladder is present.  Gastrointestinal tract: Large hiatal hernia. Small bowel loops appear normal. There is moderate stool throughout nondilated loops of colon. Surgical clips are identified in the region of the cecum and what appears to be appendiceal tissue. This may represent an appendiceal stump based on the history of prior appendectomy.  Pelvis: Uterus is absent.  No adnexal mass identified.  Retroperitoneum: No retroperitoneal or mesenteric adenopathy. No evidence for aortic aneurysm.  Abdominal wall: Postoperative changes in the anterior abdominal wall. Small bowel loops are closely apposed to the anterior abdominal wall suggesting possible adhesions.  Osseous structures: Anterolisthesis of L5 on S1, grade 1.  IMPRESSION: 1. Patchy bilateral lower lobe infiltrates, right greater than left. 2. Stable right lower lobe rounded atelectasis. 3. Moderate hiatal hernia, stable. 4. Moderate stool burden.   Electronically Signed   By: Shon Hale M.D.   On: 10/01/2014 21:11   Dg Chest 2 View  10/01/2014   CLINICAL DATA:  Shortness of breath; cough; Per EMS, pt from home. Seen yesterday and d/c for anemia. Pt called EMS twice today and refused transport on first  encounter. Pt being noncompliant. Taking off nasal canula even though short of breath. Pt has had increased shortness of breath and fever at home; delay in CXR because patient was refusing  EXAM: CHEST  2 VIEW  COMPARISON:  09/07/2014  FINDINGS: Shallow lung inflation. Heart is mildly enlarged. There are patchy densities at the lung bases consistent with infectious process. Hiatal hernia. No pulmonary edema.  IMPRESSION: 1. Cardiomegaly without edema. 2. Bilateral lower lobe infiltrates. 3. Hiatal hernia.   Electronically Signed   By: Shon Hale M.D.   On: 10/01/2014 20:59   Dg Chest 2 View  09/07/2014   CLINICAL DATA:  Wheezing. Short of breath. Cough. Personal history of pulmonary embolism.  EXAM: CHEST  2 VIEW  COMPARISON:  07/05/2014 and multiple previous  FINDINGS: Heart size is normal. Large hiatal hernia again noted. Catheter projecting over the left chest with the tip at the level of the azygos vein  again noted. Mild volume loss at the left base related to the hiatal hernia. No effusions. No active process evident.  IMPRESSION: Large hiatal hernia. Mild volume loss at the left base secondary to that. No active process otherwise.   Electronically Signed   By: Nelson Chimes M.D.   On: 09/07/2014 11:45   Dg Knee 1-2 Views Right  09/10/2014   CLINICAL DATA:  Anterior right knee pain  EXAM: RIGHT KNEE - 1-2 VIEW  COMPARISON:  None.  FINDINGS: There is no evidence of fracture, dislocation, or joint effusion. There is no evidence of arthropathy or other focal bone abnormality. Soft tissues are unremarkable. Mild-to-moderate tricompartmental degenerative change noted. No suprapatellar effusion.  IMPRESSION: No acute abnormality.   Electronically Signed   By: Conchita Paris M.D.   On: 09/10/2014 01:02   Nm Pulmonary Perf And Vent  09/08/2014   CLINICAL DATA:  A 64 year old with dyspnea, wheezing, cough, leg swelling  EXAM: NUCLEAR MEDICINE VENTILATION - PERFUSION LUNG SCAN  TECHNIQUE: Ventilation images  were obtained in multiple projections using inhaled aerosol technetium 99 M DTPA. Perfusion images were obtained in multiple projections after intravenous injection of Tc-96m MAA.  RADIOPHARMACEUTICALS:  40 mCi Tc-5m DTPA aerosol and 6 mCi Tc-72m MAA  COMPARISON:  Correlation with chest radiograph from 09/07/2014  FINDINGS: Ventilation: No focal ventilation defect.  Perfusion: No mismatch wedge shaped peripheral perfusion defects to suggest acute pulmonary embolism.  A matched perfusion/ventilation defect is noted in the left lung base, possibly related to the presence of a large hiatal hernia.  IMPRESSION: No evidence of acute pulmonary emboli.   Electronically Signed   By: Rosemarie Ax   On: 09/08/2014 16:11    Microbiology: Recent Results (from the past 240 hour(s))  Culture, blood (routine x 2)     Status: None (Preliminary result)   Collection Time: 10/01/14  7:27 PM  Result Value Ref Range Status   Specimen Description BLOOD R HAND  Final   Special Requests BOTTLES DRAWN AEROBIC AND ANAEROBIC 5ML  Final   Culture   Final           BLOOD CULTURE RECEIVED NO GROWTH TO DATE CULTURE WILL BE HELD FOR 5 DAYS BEFORE ISSUING A FINAL NEGATIVE REPORT Performed at [facility]    Report Status PENDING  Incomplete  Culture, blood (routine x 2)     Status: None (Preliminary result)   Collection Time: 10/01/14  8:01 PM  Result Value Ref Range Status   Specimen Description BLOOD RIGHT ANTECUBITAL  Final   Special Requests BOTTLES DRAWN AEROBIC ONLY 3ML  Final   Culture   Final           BLOOD CULTURE RECEIVED NO GROWTH TO DATE CULTURE WILL BE HELD FOR 5 DAYS BEFORE ISSUING A FINAL NEGATIVE REPORT Performed at [facility]    Report Status PENDING  Incomplete  MRSA PCR Screening     Status: None   Collection Time: 10/02/14  1:12 AM  Result Value Ref Range Status   MRSA by PCR NEGATIVE NEGATIVE Final    Comment:        The GeneXpert MRSA Assay (FDA approved for NASAL  specimens only), is one component of a comprehensive MRSA colonization surveillance program. It is not intended to diagnose MRSA infection nor to guide or monitor treatment for MRSA infections.      Labs: Basic Metabolic Panel:  Recent Labs Lab 10/02/14 0006 10/02/14 0730 10/03/14 0440 10/05/14 0322  NA 136  136 140 140  K 3.0* 3.2* 4.1 3.1*  CL 97 99 101 105  CO2 28 30 28 29   GLUCOSE 121* 189* 165* 123*  BUN 20 19 20 15   CREATININE 1.08 1.04 0.99 0.96  CALCIUM 8.2* 8.2* 9.3 8.4   Liver Function Tests:  Recent Labs Lab 10/02/14 0006 10/02/14 0730  AST 22 24  ALT 21 22  ALKPHOS 150* 131*  BILITOT 0.6 0.6  PROT 6.6 6.1  ALBUMIN 3.2* 3.0*   CBC:  Recent Labs Lab 10/01/14 1927 10/02/14 0730 10/03/14 0440  WBC 15.1* 9.6 10.0  NEUTROABS 13.1* 8.9*  --   HGB 10.9* 9.5* 10.2*  HCT 35.6* 30.2* 32.9*  MCV 79.5 79.7 80.6  PLT 234 185 194   Cardiac Enzymes:  Recent Labs Lab 10/02/14 0006  TROPONINI <0.03   BNP: BNP (last 3 results)  Recent Labs  09/07/14 2245  PROBNP 304.7*    Signed:  Barton Dubois  [facility] 10/06/2014, 12:51 PM

## 2014-10-06 NOTE — Progress Notes (Signed)
Patient discharge home with husband, alert and oriented, discharge instructions given, patient verbalize understanding of discharge instructions given, My Chart access declined at this time will access from home, patient in stable condition at this time

## 2014-10-07 ENCOUNTER — Ambulatory Visit: Payer: Self-pay

## 2014-10-08 LAB — CULTURE, BLOOD (ROUTINE X 2)
CULTURE: NO GROWTH
Culture: NO GROWTH

## 2014-10-09 ENCOUNTER — Ambulatory Visit: Payer: Self-pay

## 2014-10-14 DIAGNOSIS — Z79899 Other long term (current) drug therapy: Secondary | ICD-10-CM | POA: Diagnosis not present

## 2014-10-14 DIAGNOSIS — M25569 Pain in unspecified knee: Secondary | ICD-10-CM | POA: Diagnosis not present

## 2014-10-14 DIAGNOSIS — M1990 Unspecified osteoarthritis, unspecified site: Secondary | ICD-10-CM | POA: Diagnosis not present

## 2014-10-14 DIAGNOSIS — G629 Polyneuropathy, unspecified: Secondary | ICD-10-CM | POA: Diagnosis not present

## 2014-10-14 DIAGNOSIS — M179 Osteoarthritis of knee, unspecified: Secondary | ICD-10-CM | POA: Diagnosis not present

## 2014-10-14 DIAGNOSIS — G894 Chronic pain syndrome: Secondary | ICD-10-CM | POA: Diagnosis not present

## 2014-10-15 ENCOUNTER — Telehealth: Payer: Self-pay | Admitting: Pulmonary Disease

## 2014-10-15 DIAGNOSIS — K449 Diaphragmatic hernia without obstruction or gangrene: Secondary | ICD-10-CM | POA: Diagnosis not present

## 2014-10-15 NOTE — Telephone Encounter (Signed)
Called and spoke with spouse. Reports they scheduled an acute visit for tomorrow. Nothing further needed

## 2014-10-15 NOTE — Telephone Encounter (Signed)
Pt gave the phone to her husband who stated to just give her an appt and I asked for him to hold as I wanted to get MD Posey Boyer who agreed patient should go to ER to assess as its late in the day and she sounded "bad"-wheezing, chest tightness and pressure with SOB.   Pt's husband stated he would take the advice and have patient go to the ER now and also wanted an appt with a MD tomorrow.   Pt was placed on MW's schedule at 1:45pm for acute visit with the understanding of going to the ER now.

## 2014-10-15 NOTE — Telephone Encounter (Signed)
Alexandria [name] called directly as patient sounded "bad" with her breathing on the phone. I took the call and patient sounded "bad"-hard time speaking clearly, wheezing, and SOB. Pt stated her O2 level was 81%RA and HR of 162; I advised patient she needed to go directly to the ER via EMS. Pt declined stating she was fine and wanted to schedule an appt with an MD here tomorrow-she could wait.   Pt then stated this all started after she left the MD office for hernia surgery discussion. I asked patient if she uses O2 at anytime and was told she was at home and has concentrator for QHS-I advised patient to out the O2 on and that she needed to go to the ER as her HR was not going down as we would like it to(HR of 141).

## 2014-10-16 ENCOUNTER — Ambulatory Visit: Payer: Self-pay | Admitting: Internal Medicine

## 2014-10-16 ENCOUNTER — Telehealth: Payer: Self-pay | Admitting: Internal Medicine

## 2014-10-16 MED ORDER — LORAZEPAM 0.5 MG PO TABS: 0.5000 mg | ORAL_TABLET | Freq: Three times a day (TID) | ORAL | Status: DC | PRN

## 2014-10-16 NOTE — Telephone Encounter (Signed)
Okay to take medications more frequently if needed, but would not double the dose

## 2014-10-16 NOTE — Telephone Encounter (Signed)
Please advise 

## 2014-10-16 NOTE — Telephone Encounter (Signed)
Spoke to pt, told her can take medication more frequently, three times a day as needed but can not double dose. Pt verbalized understanding and stated needs refill. Told pt will call Rx into the pharmacy. Pt verbalized understanding.

## 2014-10-16 NOTE — Telephone Encounter (Signed)
Pt called to ask if Dr Raliegh Ip will increase the following med  LORazepam (ATIVAN) 0.5 MG tablet

## 2014-10-19 ENCOUNTER — Encounter: Payer: Self-pay | Admitting: Pulmonary Disease

## 2014-10-19 ENCOUNTER — Ambulatory Visit (INDEPENDENT_AMBULATORY_CARE_PROVIDER_SITE_OTHER): Payer: Medicare Other | Admitting: Pulmonary Disease

## 2014-10-19 VITALS — BP 128/76 | HR 94 | Temp 97.6°F | Ht 67.0 in | Wt 259.4 lb

## 2014-10-19 DIAGNOSIS — R0609 Other forms of dyspnea: Secondary | ICD-10-CM

## 2014-10-19 NOTE — Progress Notes (Signed)
   Subjective:    Patient ID: Alexandria [name], female    DOB: 1951-09-03, 64 y.o.   MRN: [id number]  HPI The patient comes in today for persistent dyspnea on exertion. She is felt to have shortness of breath secondary to morbid obesity, restrictive physiology from a huge hiatal hernia in her left chest, and also ongoing chronic diastolic heart failure with volume overload. She was recently in the hospital and labeled as having "pneumonia", however a review of her x-ray looks more like compressive atelectasis from her obesity and also edema. The patient did not have a single febrile temperature during her whole hospitalization or in the emergency room, and she did not feel that she had pneumonia either. She has continued to have persistent shortness of breath, and is upset because she is not able to have her hiatal hernia surgery until she is more stable.   Review of Systems  Constitutional: Negative for fever and unexpected weight change.  HENT: Negative for congestion, dental problem, ear pain, nosebleeds, postnasal drip, rhinorrhea, sinus pressure, sneezing, sore throat and trouble swallowing.   Eyes: Negative for redness and itching.  Respiratory: Positive for cough, shortness of breath and wheezing. Negative for chest tightness.   Cardiovascular: Positive for leg swelling. Negative for palpitations.  Gastrointestinal: Negative for nausea and vomiting.  Genitourinary: Negative for dysuria.  Musculoskeletal: Negative for joint swelling.  Skin: Negative for rash.  Neurological: Negative for headaches.  Hematological: Does not bruise/bleed easily.  Psychiatric/Behavioral: Negative for dysphoric mood. The patient is not nervous/anxious.        Objective:   Physical Exam Morbidly obese female in no acute distress Nose without purulence or discharge noted Neck without lymphadenopathy or thyromegaly Chest with minimal basilar crackles and excellent air flow bilaterally. Cardiac exam with  regular rate and rhythm Lower extremities with 3+ edema, no cyanosis Alert and oriented, moves all 4 extremities.       Assessment & Plan:

## 2014-10-19 NOTE — Assessment & Plan Note (Signed)
The patient continues to have significant dyspnea on exertion, but has excellent oxygen saturations on room air. The main issue for her breathing is related to her morbid obesity, restriction from her large hiatal hernia, and finally her significant diastolic heart failure with chronic volume overload. The patient tells me that her weight has been going up, and she is up 5 pounds from her last visit here. She is going to need more aggressive treatment of her diastolic dysfunction, and she apparently has an appointment with cardiology in 4 days. She will not be eligible for surgery of her hiatal hernia until she has stabilized her fluid balance. I have also encouraged her to work aggressively on weight loss.

## 2014-10-19 NOTE — Patient Instructions (Signed)
Continue on your daily weights, and record for your upcoming visit with your cardiologist. In order to be a candidate for gastric surgery, will need to have a period of stability with your fluid balance. Stop your neb treatments unless it is an emergency.  They are unlikely to help the causes of your breathing issues.

## 2014-10-23 ENCOUNTER — Encounter: Payer: Self-pay | Admitting: Cardiovascular Disease

## 2014-10-23 ENCOUNTER — Ambulatory Visit (INDEPENDENT_AMBULATORY_CARE_PROVIDER_SITE_OTHER): Payer: Medicare Other | Admitting: Cardiovascular Disease

## 2014-10-23 VITALS — BP 146/88 | HR 88 | Ht 67.0 in | Wt 251.8 lb

## 2014-10-23 DIAGNOSIS — E785 Hyperlipidemia, unspecified: Secondary | ICD-10-CM | POA: Diagnosis not present

## 2014-10-23 DIAGNOSIS — R600 Localized edema: Secondary | ICD-10-CM

## 2014-10-23 DIAGNOSIS — I10 Essential (primary) hypertension: Secondary | ICD-10-CM

## 2014-10-23 MED ORDER — FUROSEMIDE 80 MG PO TABS: 80.0000 mg | ORAL_TABLET | Freq: Every day | ORAL | Status: DC

## 2014-10-23 NOTE — Patient Instructions (Signed)
INCREASE your Lasix to 80 mg daily  In 3 weeks get some blood work completed.  Your physician wants you to follow-up in 1 year with Dr. Gwenlyn Found. You will receive a reminder letter in the mail 2 months in advance. If you do not receive a letter, please call our office to schedule the follow-up appointment.

## 2014-10-23 NOTE — Assessment & Plan Note (Signed)
Lasix 40 mg a day. I'm going to increase this to 80 mg daily I will check basic metabolic panel in 3 weeks.

## 2014-10-23 NOTE — Assessment & Plan Note (Signed)
On atorvastatin 20 mg a day followed by her PCP

## 2014-10-23 NOTE — Progress Notes (Signed)
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 She was referred to me by Alexandria [name] and Alexandria [name] for cardiovascular clearance prior to elective hiatal hernia repair.I last saw her in the office 02/26/14. She has no cardiovascular risk factors. She has had breast cancer last year and had chemotherapy and radiation therapy as well as a lumpectomy. She is gained 120 pounds in the last year. She also has bilateral lower extremity edema left greater than right probably lymphedema. She's had a 2-D echo a year ago that showed normal systolic function with diastolic dysfunction. She's been hospitalized twice in the last several months because of respiratory failure thought to be related to pneumonia and volume overload.   Current Outpatient Prescriptions  Medication Sig Dispense Refill  . anastrozole (ARIMIDEX) 1 MG tablet Take 1 mg by mouth daily.    Marland Kitchen atorvastatin (LIPITOR) 20 MG tablet Take 1 tablet (20 mg total) by mouth daily. 90 tablet 3  . clotrimazole (LOTRIMIN) 1 % cream Apply topically 2 (two) times daily. 30 g 0  . docusate sodium 100 MG CAPS Take 100 mg by mouth 2 (two) times daily. 10 capsule 0  . furosemide (LASIX) 80 MG tablet Take 0.5 tablets (40 mg total) by mouth daily.    Marland Kitchen HYDROmorphone (DILAUDID) 4 MG tablet Take 1 tablet (4 mg total) by mouth every 6 (six) hours as needed for severe pain (pain). 40 tablet 0  . HYDROmorphone HCl 16 MG T24A Take 32 mg by mouth daily.   0  . lidocaine (LIDODERM) 5 % Place 1-2 patches onto the skin daily. Remove & Discard patch within 12 hours or as directed by MD    . lidocaine-prilocaine (EMLA) cream Apply 1 application topically as needed  (port access).     Marland Kitchen lisinopril (PRINIVIL,ZESTRIL) 5 MG tablet Take 1 tablet (5 mg total) by mouth daily. 30 tablet 1  . LORazepam (ATIVAN) 0.5 MG tablet Take 1 tablet (0.5 mg total) by mouth every 8 (eight) hours as needed for anxiety. 90 tablet 1  . lubiprostone (AMITIZA) 24 MCG capsule Take 1 capsule (24 mcg total) by mouth 2 (two) times daily with a meal. 60 capsule 0  . Multiple Vitamins-Minerals (HAIR/SKIN/NAILS PO) Take 1 tablet by mouth every morning.    . potassium chloride (KLOR-CON) 20 MEQ packet Take 40 mEq by mouth daily.     No current facility-administered medications for this visit.    Allergies  Allergen Reactions  . Contrast Media [Iodinated Diagnostic Agents] Anaphylaxis  . Albuterol Other (See Comments)    Anxious   . Prednisone Other (See Comments)    Pt gets very agitated when she takes high doses of steroids    History   Social History  . Marital Status: Married    Spouse Name: N/A    Number of Children: N/A  . Years of Education: N/A   Occupational History  . retired Therapist, sports    Social History Main Topics  . Smoking status: Never Smoker   . Smokeless tobacco: Never Used  . Alcohol Use: No  . Drug Use: No  . Sexual Activity: Not Currently   Other Topics Concern  . Not on file   Social History Narrative  Review of Systems: General: negative for chills, fever, night sweats or weight changes.  Cardiovascular: negative for chest pain, dyspnea on exertion, edema, orthopnea, palpitations, paroxysmal nocturnal dyspnea or shortness of breath Dermatological: negative for rash Respiratory: negative for cough or wheezing Urologic: negative for hematuria Abdominal: negative for nausea, vomiting, diarrhea, bright red blood per rectum, melena, or hematemesis Neurologic: negative for visual changes, syncope, or dizziness All other systems reviewed and are otherwise negative except as noted above.    Blood pressure 146/88, pulse 88, height 5\' 7"  (1.702 m),  weight 251 lb 12.8 oz (114.216 kg), last menstrual period 01/30/2005.  General appearance: alert and no distress Neck: no adenopathy, no carotid bruit, no JVD, supple, symmetrical, trachea midline and thyroid not enlarged, symmetric, no tenderness/mass/nodules Lungs: clear to auscultation bilaterally Heart: regular rate and rhythm, S1, S2 normal, no murmur, click, rub or gallop Extremities: bilateral lower extremity lymphedema  EKG not performed today  ASSESSMENT AND PLAN:   Lower extremity edema Lasix 40 mg a day. I'm going to increase this to 80 mg daily I will check basic metabolic panel in 3 weeks.   Essential hypertension History of hypertension blood pressure measures 146/88.She is on lisinopril 5 mg a day as well as furosemide.   Hyperlipidemia On atorvastatin 20 mg a day followed by her PCP       Alexandria Harp MD [facility], [facility] 10/23/2014 4:09 PM

## 2014-10-23 NOTE — Assessment & Plan Note (Addendum)
History of hypertension blood pressure measures 146/88.She is on lisinopril 5 mg a day as well as furosemide.

## 2014-10-26 ENCOUNTER — Ambulatory Visit: Payer: Self-pay | Admitting: Internal Medicine

## 2014-11-02 ENCOUNTER — Ambulatory Visit: Payer: Self-pay | Admitting: Internal Medicine

## 2014-11-04 ENCOUNTER — Telehealth: Payer: Self-pay | Admitting: Internal Medicine

## 2014-11-04 NOTE — Telephone Encounter (Signed)
Pt has an appt sch for 11-16-14 for post hosp  follow up .Pt reschuled appt from 11-02-14 due to weather. Pt would like to get in this week. Pt has productive cough and history of PNA. Can I create 30 min appt?

## 2014-11-04 NOTE — Telephone Encounter (Signed)
Pt has been sch for tomorrow °

## 2014-11-04 NOTE — Telephone Encounter (Signed)
Yes, you can

## 2014-11-05 ENCOUNTER — Ambulatory Visit (INDEPENDENT_AMBULATORY_CARE_PROVIDER_SITE_OTHER): Payer: Medicare Other | Admitting: Internal Medicine

## 2014-11-05 ENCOUNTER — Encounter: Payer: Self-pay | Admitting: Internal Medicine

## 2014-11-05 VITALS — BP 130/80 | HR 88 | Temp 98.0°F | Resp 20 | Ht 67.0 in | Wt 246.0 lb

## 2014-11-05 DIAGNOSIS — I10 Essential (primary) hypertension: Secondary | ICD-10-CM | POA: Diagnosis not present

## 2014-11-05 DIAGNOSIS — R0902 Hypoxemia: Secondary | ICD-10-CM

## 2014-11-05 DIAGNOSIS — E876 Hypokalemia: Secondary | ICD-10-CM | POA: Diagnosis not present

## 2014-11-05 DIAGNOSIS — I890 Lymphedema, not elsewhere classified: Secondary | ICD-10-CM

## 2014-11-05 DIAGNOSIS — J189 Pneumonia, unspecified organism: Secondary | ICD-10-CM | POA: Diagnosis not present

## 2014-11-05 LAB — CBC WITH DIFFERENTIAL/PLATELET
BASOS ABS: 0 10*3/uL (ref 0.0–0.1)
Basophils Relative: 0 % (ref 0.0–3.0)
EOS PCT: 2.2 % (ref 0.0–5.0)
Eosinophils Absolute: 0.2 10*3/uL (ref 0.0–0.7)
HCT: 45.2 % (ref 36.0–46.0)
HEMOGLOBIN: 15.1 g/dL — AB (ref 12.0–15.0)
LYMPHS ABS: 1.7 10*3/uL (ref 0.7–4.0)
LYMPHS PCT: 25 % (ref 12.0–46.0)
MCHC: 33.3 g/dL (ref 30.0–36.0)
MCV: 85.7 fl (ref 78.0–100.0)
MONOS PCT: 8.5 % (ref 3.0–12.0)
Monocytes Absolute: 0.6 10*3/uL (ref 0.1–1.0)
Neutro Abs: 4.5 10*3/uL (ref 1.4–7.7)
Neutrophils Relative %: 64.3 % (ref 43.0–77.0)
Platelets: 190 10*3/uL (ref 150.0–400.0)
RBC: 5.27 Mil/uL — AB (ref 3.87–5.11)
RDW: 27.7 % — AB (ref 11.5–15.5)
WBC: 7 10*3/uL (ref 4.0–10.5)

## 2014-11-05 LAB — BASIC METABOLIC PANEL
BUN: 26 mg/dL — ABNORMAL HIGH (ref 6–23)
CHLORIDE: 98 meq/L (ref 96–112)
CO2: 33 mEq/L — ABNORMAL HIGH (ref 19–32)
Calcium: 9.7 mg/dL (ref 8.4–10.5)
Creatinine, Ser: 1.19 mg/dL (ref 0.40–1.20)
GFR: 48.88 mL/min — AB (ref >60.00)
Glucose, Bld: 95 mg/dL (ref 70–99)
POTASSIUM: 3.8 meq/L (ref 3.5–5.1)
SODIUM: 138 meq/L (ref 135–145)

## 2014-11-05 MED ORDER — LIDOCAINE-PRILOCAINE 2.5-2.5 % EX CREA: 1.0000 "application " | TOPICAL_CREAM | CUTANEOUS | Status: DC | PRN

## 2014-11-05 NOTE — Progress Notes (Signed)
Subjective:    Patient ID: Alexandria [name], female    DOB: March 04, 1951, 64 y.o.   MRN: [id number]  HPI Admit date: 10/01/2014 Discharge date: 10/06/2014  Recommendations for Outpatient Follow-up:  Repeat CXR in 3-4 weeks to guaranteed resolution of infiltrates Repeat BMET to follow electrolytes and renal function Please follow and assist patient with appointments to be seen for surgical repair of hiatal hernia  Discharge Diagnoses:  Acute resp failure with hypoxia HCAP (healthcare-associated pneumonia) Malignant neoplasm of upper-outer quadrant of female breast Chronic back pain Morbid obesity Adjustment disorder with mixed anxiety and depressed mood Chronic constipation Iron deficiency anemia Essential hypertension GERD Hiatal hernia  Discharge Condition: stable and improve. Will follow with PCP i 10 days and with oncology service in 2 weeks or so (Cancer center will contact patient with appointment details)  Diet recommendation: low sodium/heart healthy diet  64 year old patient who is seen today following a recent hospital discharge.  She was admitted for bilateral community-acquired pneumonia with acute respiratory failure.  She has seen pulmonary medicine in follow-up.  She has a history of essential hypertension, obesity and prior history of breast cancer.  Since her discharge she has done remarkably well.  She complains of minimal cough.  She is quite pleased with her progress  Past Medical History  Diagnosis Date  . Iron deficiency anemia   . Status post chemotherapy     4 cycles of Taxotere and cytoxan  . Chronic back pain     "all the way up and down"  . Hx of pulmonary embolus     During c-Section  . Peripheral neuropathic pain   . Obesity     Class 2  . S/P radiation therapy  03/04/2013-04/17/2013    1) Right breast / 50 Gy in 25 fractions/ 2) Right breast boost / 10 Gy in 5 fractions  . Diastolic heart failure   . Hx of radiation therapy 03/04/13- 04/17/13   right breast 50 Gy 25 fractions, right breast boost 10 Gy 5 fractions  . Anxiety   . H/O hiatal hernia   . 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 GAD (generalized anxiety disorder)   . 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 Louretta Shorten  . Lymphedema     bilateral lower extremity  . Complication of anesthesia     "I come out really anxious; need Ativan to ease me out" (09/07/2014)  . Hypertension   . Diastolic dysfunction   . Pneumonia     "at least twice/yr" (09/07/2014)  . Chronic bronchitis     "get it q yr" (09/07/2014)  . On home oxygen therapy     "2L just at night" (09/07/2014)  . History of blood transfusion "3-4"    "never can tell why; don't know where the blood was coming from; doesn't show up in stool or urine"  . Daily headache     "recently" (09/07/2014)  . DDD (degenerative disc disease), cervical   . DDD (degenerative disc disease), lumbosacral   . DDD (degenerative disc disease), thoracolumbar   . Fibromyalgia   . Hyperlipidemia     History   Social History  . Marital Status: Married    Spouse Name: N/A  . Number of Children: N/A  . Years of Education: N/A   Occupational History  . retired Therapist, sports    Social History  Main Topics  . Smoking status: Never Smoker   . Smokeless tobacco: Never Used  . Alcohol Use: No  . Drug Use: No  . Sexual Activity: Not Currently   Other Topics Concern  . Not on file   Social History Narrative    Past Surgical History  Procedure Laterality Date  . Cesarean section  6812; 1978; 1980;     "stillborn in 83"  . Partial colectomy N/A 07/04/2014    Procedure: REPAIR OF INCARCERATED INCISIONAL HERNIA WITH MESH;  Surgeon: Excell Seltzer, MD;  Location: [facility];  Service: General;  Laterality: N/A;  . Tonsillectomy    . Appendectomy    . Breast biopsy Right   . Breast lumpectomy Right   . Inguinal  hernia repair Bilateral     "cancer"  . Umbilical hernia repair  X 2  . Dilation and curettage of uterus  "numerous"  . Abdominal hysterectomy    . Foot surgery Right ~ 2013 X 3    "put pins in but pins kept breaking"    Family History  Problem Relation Age of Onset  . Parkinson's disease Mother   . Emphysema Maternal Grandfather   . COPD Sister   . CAD Neg Hx     Allergies  Allergen Reactions  . Contrast Media [Iodinated Diagnostic Agents] Anaphylaxis  . Albuterol Other (See Comments)    Anxious   . Prednisone Other (See Comments)    Pt gets very agitated when she takes high doses of steroids    Current Outpatient Prescriptions on File Prior to Visit  Medication Sig Dispense Refill  . anastrozole (ARIMIDEX) 1 MG tablet Take 1 mg by mouth daily.    Marland Kitchen atorvastatin (LIPITOR) 20 MG tablet Take 1 tablet (20 mg total) by mouth daily. 90 tablet 3  . clotrimazole (LOTRIMIN) 1 % cream Apply topically 2 (two) times daily. 30 g 0  . docusate sodium 100 MG CAPS Take 100 mg by mouth 2 (two) times daily. 10 capsule 0  . furosemide (LASIX) 80 MG tablet Take 1 tablet (80 mg total) by mouth daily.    Marland Kitchen HYDROmorphone (DILAUDID) 4 MG tablet Take 1 tablet (4 mg total) by mouth every 6 (six) hours as needed for severe pain (pain). (Patient taking differently: Take 4 mg by mouth every 6 (six) hours as needed for severe pain (Pt states she is allowed 5 a day). ) 40 tablet 0  . HYDROmorphone HCl 16 MG T24A Take 32 mg by mouth daily.   0  . lidocaine (LIDODERM) 5 % Place 1-2 patches onto the skin daily. Remove & Discard patch within 12 hours or as directed by MD    . lisinopril (PRINIVIL,ZESTRIL) 5 MG tablet Take 1 tablet (5 mg total) by mouth daily. 30 tablet 1  . LORazepam (ATIVAN) 0.5 MG tablet Take 1 tablet (0.5 mg total) by mouth every 8 (eight) hours as needed for anxiety. 90 tablet 1  . lubiprostone (AMITIZA) 24 MCG capsule Take 1 capsule (24 mcg total) by mouth 2 (two) times daily with a meal.  60 capsule 0  . Multiple Vitamins-Minerals (HAIR/SKIN/NAILS PO) Take 1 tablet by mouth every morning.    . potassium chloride (KLOR-CON) 20 MEQ packet Take 40 mEq by mouth daily.     No current facility-administered medications on file prior to visit.    BP 130/80 mmHg  Pulse 88  Temp(Src) 98 F (36.7 C) (Oral)  Resp 20  Ht 5' 7"  (1.702 m)  Wt 246  lb (111.585 kg)  BMI 38.52 kg/m2  SpO2 95%  LMP 01/30/2005      Review of Systems  Constitutional: Negative.   HENT: Negative for congestion, dental problem, hearing loss, rhinorrhea, sinus pressure, sore throat and tinnitus.   Eyes: Negative for pain, discharge and visual disturbance.  Respiratory: Positive for cough. Negative for shortness of breath.   Cardiovascular: Negative for chest pain, palpitations and leg swelling.  Gastrointestinal: Negative for nausea, vomiting, abdominal pain, diarrhea, constipation, blood in stool and abdominal distention.  Genitourinary: Negative for dysuria, urgency, frequency, hematuria, flank pain, vaginal bleeding, vaginal discharge, difficulty urinating, vaginal pain and pelvic pain.  Musculoskeletal: Negative for joint swelling, arthralgias and gait problem.  Skin: Negative for rash.  Neurological: Negative for dizziness, syncope, speech difficulty, weakness, numbness and headaches.  Hematological: Negative for adenopathy.  Psychiatric/Behavioral: Negative for behavioral problems, dysphoric mood and agitation. The patient is not nervous/anxious.        Objective:   Physical Exam  Constitutional: She is oriented to person, place, and time. She appears well-developed and well-nourished. No distress.  HENT:  Head: Normocephalic.  Right Ear: External ear normal.  Left Ear: External ear normal.  Mouth/Throat: Oropharynx is clear and moist.  Eyes: Conjunctivae and EOM are normal. Pupils are equal, round, and reactive to light.  Neck: Normal range of motion. Neck supple. No thyromegaly present.    Cardiovascular: Normal rate, regular rhythm, normal heart sounds and intact distal pulses.   Pulmonary/Chest: Effort normal and breath sounds normal. No respiratory distress. She has no wheezes. She has no rales.  Abdominal: Soft. Bowel sounds are normal. She exhibits no mass. There is no tenderness.  Musculoskeletal: Normal range of motion.  Lymphadenopathy:    She has no cervical adenopathy.  Neurological: She is alert and oriented to person, place, and time.  Skin: Skin is warm and dry. No rash noted.  Psychiatric: She has a normal mood and affect. Her behavior is normal.          Assessment & Plan:   Status post pneumonia.  Will check a follow-up chest x-ray Hypertension, stable Chronic low back pain.  Follow-up chronic pain management History diastolic heart failure, compensated History of breast cancer  No change in medical regimen Follow-up chest x-ray We'll check electrolytes in view of her history of hypokalemia and high-dose diuretic use Recheck 3 months or as needed

## 2014-11-05 NOTE — Patient Instructions (Signed)
Limit your sodium (Salt) intake    It is important that you exercise regularly, at least 20 minutes 3 to 4 times per week.  If you develop chest pain or shortness of breath seek  medical attention.  Return in 3 months for follow-up  Please check your blood pressure on a regular basis.  If it is consistently greater than 150/90, please make an office appointment.

## 2014-11-05 NOTE — Progress Notes (Signed)
Pre visit review using our clinic review tool, if applicable. No additional management support is needed unless otherwise documented below in the visit note. 

## 2014-11-06 ENCOUNTER — Ambulatory Visit: Payer: Medicare Other | Admitting: Pulmonary Disease

## 2014-11-09 ENCOUNTER — Telehealth: Payer: Self-pay | Admitting: Internal Medicine

## 2014-11-09 ENCOUNTER — Other Ambulatory Visit: Payer: Self-pay | Admitting: Internal Medicine

## 2014-11-09 MED ORDER — HYDROCODONE-HOMATROPINE 5-1.5 MG/5ML PO SYRP: 5.0000 mL | ORAL_SOLUTION | Freq: Four times a day (QID) | ORAL | Status: DC | PRN

## 2014-11-09 NOTE — Telephone Encounter (Signed)
Patient is requesting rx for HYDROcodone-homatropine (HYCODAN) 5-1.5 MG/5ML syrup.  She states she has a really bad cough and not finding any relief.

## 2014-11-09 NOTE — Telephone Encounter (Signed)
Okay to refill Hycodan cough syrup?

## 2014-11-09 NOTE — Telephone Encounter (Signed)
ok 

## 2014-11-09 NOTE — Telephone Encounter (Signed)
Left message Rx ready for pickup, will be at the front desk. Rx printed and signed.  

## 2014-11-10 ENCOUNTER — Ambulatory Visit: Payer: Self-pay | Admitting: Hematology and Oncology

## 2014-11-10 ENCOUNTER — Telehealth: Payer: Self-pay | Admitting: Hematology and Oncology

## 2014-11-10 ENCOUNTER — Telehealth: Payer: Self-pay | Admitting: *Deleted

## 2014-11-10 ENCOUNTER — Other Ambulatory Visit: Payer: Self-pay

## 2014-11-10 NOTE — Telephone Encounter (Signed)
Lft msg for pt confirming labs/ov per 02/23 POF, mailed out schedule to pt... KJ

## 2014-11-10 NOTE — Telephone Encounter (Signed)
Received voice message from patient stating,"I'm so sorry but I just woke up. I need to cancel today's appointment and reschedule. Return number is [phone]. Thanks."

## 2014-11-10 NOTE — Telephone Encounter (Signed)
3/24 at 1230 pm, 30 mins with labs prior

## 2014-11-12 DIAGNOSIS — Z79899 Other long term (current) drug therapy: Secondary | ICD-10-CM | POA: Diagnosis not present

## 2014-11-12 DIAGNOSIS — G894 Chronic pain syndrome: Secondary | ICD-10-CM | POA: Diagnosis not present

## 2014-11-12 DIAGNOSIS — M792 Neuralgia and neuritis, unspecified: Secondary | ICD-10-CM | POA: Diagnosis not present

## 2014-11-12 DIAGNOSIS — M79606 Pain in leg, unspecified: Secondary | ICD-10-CM | POA: Diagnosis not present

## 2014-11-12 DIAGNOSIS — M545 Low back pain: Secondary | ICD-10-CM | POA: Diagnosis not present

## 2014-11-12 DIAGNOSIS — M25569 Pain in unspecified knee: Secondary | ICD-10-CM | POA: Diagnosis not present

## 2014-11-16 ENCOUNTER — Ambulatory Visit: Payer: Self-pay | Admitting: Internal Medicine

## 2014-12-01 ENCOUNTER — Other Ambulatory Visit: Payer: Self-pay | Admitting: Hematology and Oncology

## 2014-12-04 ENCOUNTER — Emergency Department (HOSPITAL_COMMUNITY): Payer: Medicare Other

## 2014-12-04 ENCOUNTER — Encounter (HOSPITAL_COMMUNITY): Payer: Self-pay | Admitting: Neurology

## 2014-12-04 ENCOUNTER — Inpatient Hospital Stay (HOSPITAL_COMMUNITY)
Admission: EM | Admit: 2014-12-04 | Discharge: 2014-12-08 | DRG: 194 | Disposition: A | Discharge status: Home / Self Care | Payer: Medicare Other | Attending: Internal Medicine | Admitting: Internal Medicine

## 2014-12-04 DIAGNOSIS — Z9981 Dependence on supplemental oxygen: Secondary | ICD-10-CM | POA: Diagnosis not present

## 2014-12-04 DIAGNOSIS — K760 Fatty (change of) liver, not elsewhere classified: Secondary | ICD-10-CM | POA: Diagnosis present

## 2014-12-04 DIAGNOSIS — I5032 Chronic diastolic (congestive) heart failure: Secondary | ICD-10-CM | POA: Diagnosis present

## 2014-12-04 DIAGNOSIS — Z923 Personal history of irradiation: Secondary | ICD-10-CM

## 2014-12-04 DIAGNOSIS — J189 Pneumonia, unspecified organism: Principal | ICD-10-CM | POA: Diagnosis present

## 2014-12-04 DIAGNOSIS — I129 Hypertensive chronic kidney disease with stage 1 through stage 4 chronic kidney disease, or unspecified chronic kidney disease: Secondary | ICD-10-CM | POA: Diagnosis present

## 2014-12-04 DIAGNOSIS — I10 Essential (primary) hypertension: Secondary | ICD-10-CM | POA: Diagnosis present

## 2014-12-04 DIAGNOSIS — Y95 Nosocomial condition: Secondary | ICD-10-CM | POA: Diagnosis present

## 2014-12-04 DIAGNOSIS — T85698A Other mechanical complication of other specified internal prosthetic devices, implants and grafts, initial encounter: Secondary | ICD-10-CM

## 2014-12-04 DIAGNOSIS — R05 Cough: Secondary | ICD-10-CM

## 2014-12-04 DIAGNOSIS — Z452 Encounter for adjustment and management of vascular access device: Secondary | ICD-10-CM | POA: Diagnosis not present

## 2014-12-04 DIAGNOSIS — Z888 Allergy status to other drugs, medicaments and biological substances status: Secondary | ICD-10-CM

## 2014-12-04 DIAGNOSIS — N179 Acute kidney failure, unspecified: Secondary | ICD-10-CM | POA: Diagnosis not present

## 2014-12-04 DIAGNOSIS — C50919 Malignant neoplasm of unspecified site of unspecified female breast: Secondary | ICD-10-CM | POA: Diagnosis present

## 2014-12-04 DIAGNOSIS — R109 Unspecified abdominal pain: Secondary | ICD-10-CM

## 2014-12-04 DIAGNOSIS — N182 Chronic kidney disease, stage 2 (mild): Secondary | ICD-10-CM | POA: Diagnosis present

## 2014-12-04 DIAGNOSIS — M549 Dorsalgia, unspecified: Secondary | ICD-10-CM | POA: Diagnosis not present

## 2014-12-04 DIAGNOSIS — A419 Sepsis, unspecified organism: Secondary | ICD-10-CM | POA: Insufficient documentation

## 2014-12-04 DIAGNOSIS — Z6837 Body mass index (BMI) 37.0-37.9, adult: Secondary | ICD-10-CM

## 2014-12-04 DIAGNOSIS — Z91041 Radiographic dye allergy status: Secondary | ICD-10-CM | POA: Diagnosis not present

## 2014-12-04 DIAGNOSIS — Z79891 Long term (current) use of opiate analgesic: Secondary | ICD-10-CM | POA: Diagnosis not present

## 2014-12-04 DIAGNOSIS — E785 Hyperlipidemia, unspecified: Secondary | ICD-10-CM | POA: Diagnosis present

## 2014-12-04 DIAGNOSIS — R000 Tachycardia, unspecified: Secondary | ICD-10-CM

## 2014-12-04 DIAGNOSIS — G8929 Other chronic pain: Secondary | ICD-10-CM | POA: Diagnosis not present

## 2014-12-04 DIAGNOSIS — R0902 Hypoxemia: Secondary | ICD-10-CM

## 2014-12-04 DIAGNOSIS — Z853 Personal history of malignant neoplasm of breast: Secondary | ICD-10-CM | POA: Diagnosis not present

## 2014-12-04 DIAGNOSIS — L03115 Cellulitis of right lower limb: Secondary | ICD-10-CM | POA: Diagnosis present

## 2014-12-04 DIAGNOSIS — K449 Diaphragmatic hernia without obstruction or gangrene: Secondary | ICD-10-CM | POA: Diagnosis present

## 2014-12-04 DIAGNOSIS — N189 Chronic kidney disease, unspecified: Secondary | ICD-10-CM

## 2014-12-04 DIAGNOSIS — Z9049 Acquired absence of other specified parts of digestive tract: Secondary | ICD-10-CM | POA: Diagnosis present

## 2014-12-04 DIAGNOSIS — Z9221 Personal history of antineoplastic chemotherapy: Secondary | ICD-10-CM

## 2014-12-04 DIAGNOSIS — M797 Fibromyalgia: Secondary | ICD-10-CM | POA: Diagnosis present

## 2014-12-04 DIAGNOSIS — R112 Nausea with vomiting, unspecified: Secondary | ICD-10-CM | POA: Diagnosis not present

## 2014-12-04 DIAGNOSIS — T85618A Breakdown (mechanical) of other specified internal prosthetic devices, implants and grafts, initial encounter: Secondary | ICD-10-CM

## 2014-12-04 DIAGNOSIS — J449 Chronic obstructive pulmonary disease, unspecified: Secondary | ICD-10-CM | POA: Insufficient documentation

## 2014-12-04 DIAGNOSIS — J42 Unspecified chronic bronchitis: Secondary | ICD-10-CM

## 2014-12-04 DIAGNOSIS — J188 Other pneumonia, unspecified organism: Secondary | ICD-10-CM | POA: Diagnosis not present

## 2014-12-04 DIAGNOSIS — Z90710 Acquired absence of both cervix and uterus: Secondary | ICD-10-CM | POA: Diagnosis not present

## 2014-12-04 DIAGNOSIS — R059 Cough, unspecified: Secondary | ICD-10-CM

## 2014-12-04 DIAGNOSIS — C50911 Malignant neoplasm of unspecified site of right female breast: Secondary | ICD-10-CM | POA: Diagnosis not present

## 2014-12-04 DIAGNOSIS — F411 Generalized anxiety disorder: Secondary | ICD-10-CM | POA: Diagnosis present

## 2014-12-04 DIAGNOSIS — T829XXA Unspecified complication of cardiac and vascular prosthetic device, implant and graft, initial encounter: Secondary | ICD-10-CM

## 2014-12-04 DIAGNOSIS — R918 Other nonspecific abnormal finding of lung field: Secondary | ICD-10-CM | POA: Diagnosis not present

## 2014-12-04 LAB — COMPREHENSIVE METABOLIC PANEL
ALBUMIN: 3.6 g/dL (ref 3.5–5.2)
ALK PHOS: 159 U/L — AB (ref 39–117)
ALT: 31 U/L (ref 0–35)
AST: 30 U/L (ref 0–37)
Anion gap: 12 (ref 5–15)
BUN: 31 mg/dL — ABNORMAL HIGH (ref 6–23)
CALCIUM: 9.1 mg/dL (ref 8.4–10.5)
CO2: 24 mmol/L (ref 19–32)
Chloride: 97 mmol/L (ref 96–112)
Creatinine, Ser: 1.42 mg/dL — ABNORMAL HIGH (ref 0.50–1.10)
GFR calc non Af Amer: 39 mL/min — ABNORMAL LOW (ref >90)
GFR, EST AFRICAN AMERICAN: 45 mL/min — AB (ref >90)
GLUCOSE: 145 mg/dL — AB (ref 70–99)
POTASSIUM: 3.7 mmol/L (ref 3.5–5.1)
SODIUM: 133 mmol/L — AB (ref 135–145)
TOTAL PROTEIN: 7 g/dL (ref 6.0–8.3)
Total Bilirubin: 0.6 mg/dL (ref 0.3–1.2)

## 2014-12-04 LAB — CBC WITH DIFFERENTIAL/PLATELET
Basophils Absolute: 0 10*3/uL (ref 0.0–0.1)
Basophils Relative: 0 % (ref 0–1)
Eosinophils Absolute: 0.2 10*3/uL (ref 0.0–0.7)
Eosinophils Relative: 4 % (ref 0–5)
HCT: 42.7 % (ref 36.0–46.0)
Hemoglobin: 14.3 g/dL (ref 12.0–15.0)
LYMPHS PCT: 22 % (ref 12–46)
Lymphs Abs: 1.2 10*3/uL (ref 0.7–4.0)
MCH: 28.7 pg (ref 26.0–34.0)
MCHC: 33.5 g/dL (ref 30.0–36.0)
MCV: 85.6 fL (ref 78.0–100.0)
Monocytes Absolute: 0.7 10*3/uL (ref 0.1–1.0)
Monocytes Relative: 13 % — ABNORMAL HIGH (ref 3–12)
NEUTROS ABS: 3.2 10*3/uL (ref 1.7–7.7)
Neutrophils Relative %: 61 % (ref 43–77)
PLATELETS: 195 10*3/uL (ref 150–400)
RBC: 4.99 MIL/uL (ref 3.87–5.11)
RDW: 19.1 % — AB (ref 11.5–15.5)
WBC: 5.2 10*3/uL (ref 4.0–10.5)

## 2014-12-04 LAB — URINALYSIS, ROUTINE W REFLEX MICROSCOPIC
BILIRUBIN URINE: NEGATIVE
Glucose, UA: NEGATIVE mg/dL
HGB URINE DIPSTICK: NEGATIVE
KETONES UR: NEGATIVE mg/dL
Leukocytes, UA: NEGATIVE
NITRITE: NEGATIVE
Protein, ur: NEGATIVE mg/dL
Specific Gravity, Urine: 1.018 (ref 1.005–1.030)
UROBILINOGEN UA: 0.2 mg/dL (ref 0.0–1.0)
pH: 5.5 (ref 5.0–8.0)

## 2014-12-04 LAB — RAPID URINE DRUG SCREEN, HOSP PERFORMED
Amphetamines: NOT DETECTED
Barbiturates: NOT DETECTED
Benzodiazepines: POSITIVE — AB
COCAINE: NOT DETECTED
Opiates: POSITIVE — AB
Tetrahydrocannabinol: NOT DETECTED

## 2014-12-04 LAB — LIPASE, BLOOD: Lipase: 26 U/L (ref 11–59)

## 2014-12-04 LAB — I-STAT CG4 LACTIC ACID, ED
Lactic Acid, Venous: 1.01 mmol/L (ref 0.5–2.0)
Lactic Acid, Venous: 0.87 mmol/L (ref 0.5–2.0)

## 2014-12-04 LAB — I-STAT TROPONIN, ED: Troponin i, poc: 0 ng/mL (ref 0.00–0.08)

## 2014-12-04 MED ORDER — HYDROMORPHONE HCL 1 MG/ML IJ SOLN
2.0000 mg | Freq: Once | INTRAMUSCULAR | Status: AC
  Administered 2014-12-05: 2 mg via INTRAVENOUS
  Filled 2014-12-04: qty 2

## 2014-12-04 MED ORDER — VANCOMYCIN HCL 10 G IV SOLR
1750.0000 mg | Freq: Once | INTRAVENOUS | Status: AC
  Administered 2014-12-05: 1750 mg via INTRAVENOUS
  Filled 2014-12-04: qty 1750

## 2014-12-04 MED ORDER — SODIUM CHLORIDE 0.9 % IV BOLUS (SEPSIS)
1000.0000 mL | Freq: Once | INTRAVENOUS | Status: AC
  Administered 2014-12-04: 1000 mL via INTRAVENOUS

## 2014-12-04 MED ORDER — HYDROMORPHONE HCL 1 MG/ML IJ SOLN
1.0000 mg | Freq: Once | INTRAMUSCULAR | Status: AC
  Administered 2014-12-04: 1 mg via INTRAVENOUS
  Filled 2014-12-04: qty 1

## 2014-12-04 MED ORDER — VANCOMYCIN HCL 10 G IV SOLR
1250.0000 mg | INTRAVENOUS | Status: DC
  Administered 2014-12-05: 1250 mg via INTRAVENOUS
  Filled 2014-12-04 (×2): qty 1250

## 2014-12-04 MED ORDER — LORAZEPAM 2 MG/ML IJ SOLN
1.0000 mg | Freq: Once | INTRAMUSCULAR | Status: AC
  Administered 2014-12-04: 1 mg via INTRAVENOUS
  Filled 2014-12-04: qty 1

## 2014-12-04 MED ORDER — DEXTROSE 5 % IV SOLN
2.0000 g | INTRAVENOUS | Status: DC
  Administered 2014-12-04 – 2014-12-05 (×2): 2 g via INTRAVENOUS
  Filled 2014-12-04 (×3): qty 2

## 2014-12-04 MED ORDER — ONDANSETRON HCL 4 MG/2ML IJ SOLN
4.0000 mg | Freq: Once | INTRAMUSCULAR | Status: AC
  Administered 2014-12-04: 4 mg via INTRAVENOUS
  Filled 2014-12-04: qty 2

## 2014-12-04 NOTE — ED Notes (Signed)
Pt reports for 1 week has been having fever, n/v can't eat anything. Also right lower back pain. Denies diarrhea. Pt is a x 4.

## 2014-12-04 NOTE — ED Provider Notes (Signed)
CSN: [id number]     Arrival date & time 12/04/14  1751 History   First MD Initiated Contact with Patient 12/04/14 1916     Chief Complaint  Patient presents with  . Fever  . Back Pain     (Consider location/radiation/quality/duration/timing/severity/associated sxs/prior Treatment) Patient is a 64 y.o. female presenting with fever and back pain.  Fever Back Pain Associated symptoms: fever     Pt is a 64 y.o. F with extensive past medical history presenting today with nausea, vomiting, back pain, and fever x 1 week. She states that the highest her temp got was 101.73F yesterday. Associated sxs are generalized weakness/fatigue and chills. States she has had some SOB, slightly worse than normal with productive cough. She uses O2 at home intermittently. No abdominal pain but has had some nausea and vomiting throughout the day today.  States last emesis was just PTA in ED.  states some constipation but freely passing flatus.  Patient does have hx of multiple prior abdominal surgeries and prior SBO.  Denies dysuria, urinary frequency, or abnormal vaginal discharge. She states she did have pneumonia 3x already this year, states she never got her FU CXR done.  Patient's back pain is chronic.  She is on home dilaudid for this.  Denies new injuries, trauma, or falls.  deneis numbness, paresthesias, or weakness of lower extremities.  No loss of bowel or bladder control.  Patient tachycardic on arrival, remainder of VS stable.  Past Medical History  Diagnosis Date  . Iron deficiency anemia   . Status post chemotherapy     4 cycles of Taxotere and cytoxan  . Chronic back pain     "all the way up and down"  . Hx of pulmonary embolus     During c-Section  . Peripheral neuropathic pain   . Obesity     Class 2  . S/P radiation therapy  03/04/2013-04/17/2013    1) Right breast / 50 Gy in 25 fractions/ 2) Right breast boost / 10 Gy in 5 fractions  . Diastolic heart failure   . Hx of radiation therapy  03/04/13- 04/17/13    right breast 50 Gy 25 fractions, right breast boost 10 Gy 5 fractions  . Anxiety   . H/O hiatal hernia   . 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 GAD (generalized anxiety disorder)   . 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 Louretta Shorten  . Lymphedema     bilateral lower extremity  . Complication of anesthesia     "I come out really anxious; need Ativan to ease me out" (09/07/2014)  . Hypertension   . Diastolic dysfunction   . Pneumonia     "at least twice/yr" (09/07/2014)  . Chronic bronchitis     "get it q yr" (09/07/2014)  . On home oxygen therapy     "2L just at night" (09/07/2014)  . History of blood transfusion "3-4"    "never can tell why; don't know where the blood was coming from; doesn't show up in stool or urine"  . Daily headache     "recently" (09/07/2014)  . DDD (degenerative disc disease), cervical   . DDD (degenerative disc disease), lumbosacral   . DDD (degenerative disc disease), thoracolumbar   . Fibromyalgia   . Hyperlipidemia    Past Surgical History  Procedure Laterality Date  . Cesarean  section  [id number]; 1978; 1980;     "stillborn in 84"  . Partial colectomy N/A 07/04/2014    Procedure: REPAIR OF INCARCERATED INCISIONAL HERNIA WITH MESH;  Surgeon: Excell Seltzer, MD;  Location: [facility];  Service: General;  Laterality: N/A;  . Tonsillectomy    . Appendectomy    . Breast biopsy Right   . Breast lumpectomy Right   . Inguinal hernia repair Bilateral     "cancer"  . Umbilical hernia repair  X 2  . Dilation and curettage of uterus  "numerous"  . Abdominal hysterectomy    . Foot surgery Right ~ 2013 X 3    "put pins in but pins kept breaking"   Family History  Problem Relation Age of Onset  . Parkinson's disease Mother   . Emphysema Maternal Grandfather   . COPD Sister   . CAD Neg Hx    History  Substance Use  Topics  . Smoking status: Never Smoker   . Smokeless tobacco: Never Used  . Alcohol Use: No   OB History    No data available     Review of Systems  Constitutional: Positive for fever.  Musculoskeletal: Positive for back pain.      Allergies  Contrast media; Albuterol; and Prednisone  Home Medications   Prior to Admission medications   Medication Sig Start Date End Date Taking? Authorizing Provider  anastrozole (ARIMIDEX) 1 MG tablet TAKE 1 TABLET BY MOUTH EVERY DAY 12/02/14  Yes Heath Lark, MD  atorvastatin (LIPITOR) 20 MG tablet Take 1 tablet (20 mg total) by mouth daily. 06/18/14  Yes Marletta Lor, MD  clotrimazole (LOTRIMIN) 1 % cream Apply topically 2 (two) times daily. Patient taking differently: Apply 1 application topically 2 (two) times daily as needed (for rash).  09/10/14  Yes Grace Bushy Minor, NP  docusate sodium 100 MG CAPS Take 100 mg by mouth 2 (two) times daily. 07/10/14  Yes Megan N Dort, PA-C  furosemide (LASIX) 80 MG tablet Take 1 tablet (80 mg total) by mouth daily. 10/23/14  Yes Lorretta Harp, MD  HYDROcodone-homatropine [facility]) 5-1.5 MG/5ML syrup Take 5 mLs by mouth every 6 (six) hours as needed for cough. 11/09/14  Yes Marletta Lor, MD  HYDROmorphone (DILAUDID) 4 MG tablet Take 1 tablet (4 mg total) by mouth every 6 (six) hours as needed for severe pain (pain). Patient taking differently: Take 4 mg by mouth 5 (five) times daily as needed for severe pain (Pt states she is allowed 5 a day).  07/10/14  Yes Megan N Dort, PA-C  HYDROmorphone HCl 16 MG T24A Take 16 mg by mouth every 12 (twelve) hours.  07/23/14  Yes Historical Provider, MD  lidocaine (LIDODERM) 5 % Place 1-2 patches onto the skin daily. Remove & Discard patch within 12 hours or as directed by MD   Yes Historical Provider, MD  lidocaine-prilocaine (EMLA) cream Apply 1 application topically as needed (port access). 11/05/14  Yes Marletta Lor, MD  LORazepam (ATIVAN) 0.5 MG tablet  Take 1 tablet (0.5 mg total) by mouth every 8 (eight) hours as needed for anxiety. 10/16/14  Yes Marletta Lor, MD  anastrozole (ARIMIDEX) 1 MG tablet Take 1 mg by mouth daily.    Historical Provider, MD  diphenhydrAMINE (SOMINEX) 25 MG tablet Take 25-50 mg by mouth 2 (two) times daily as needed for sleep.    Historical Provider, MD  lisinopril (PRINIVIL,ZESTRIL) 5 MG tablet Take 1 tablet (5 mg total) by mouth daily.  09/10/14   Grace Bushy Minor, NP  lubiprostone (AMITIZA) 24 MCG capsule Take 1 capsule (24 mcg total) by mouth 2 (two) times daily with a meal. 04/03/14   Heath Lark, MD  Multiple Vitamins-Minerals (HAIR/SKIN/NAILS PO) Take 1 tablet by mouth every morning.    Historical Provider, MD  potassium chloride (KLOR-CON) 20 MEQ packet Take 40 mEq by mouth daily. 09/10/14   Grace Bushy Minor, NP   BP 117/66 mmHg  Pulse 131  Temp(Src) 99.9 F (37.7 C) (Rectal)  Resp 28  Ht 5' 7"  (1.702 m)  Wt 239 lb (108.41 kg)  BMI 37.42 kg/m2  SpO2 94%  LMP 01/30/2005   Physical Exam  Constitutional: She is oriented to person, place, and time. She appears well-developed and well-nourished. No distress.  HENT:  Head: Normocephalic and atraumatic.  Mouth/Throat: Oropharynx is clear and moist.  Eyes: Conjunctivae and EOM are normal. Pupils are equal, round, and reactive to light.  Neck: Normal range of motion. Neck supple.  Cardiovascular: Normal rate, regular rhythm and normal heart sounds.  Exam reveals no gallop and no friction rub.   No murmur heard. Pulmonary/Chest: Effort normal and breath sounds normal. No respiratory distress. She has no wheezes. She has no rhonchi. She has no rales.  Coarse breath sounds bilaterally, NAD  Abdominal: Soft. Bowel sounds are normal. She exhibits no distension and no mass. There is no tenderness. There is no rebound and no guarding.  Musculoskeletal: Normal range of motion. She exhibits no edema.       Back:  Tenderness of right SI joint and posterior hip  without noted bony deformity, positive straight leg raise on right, normal strength and sensation of bilateral lower extremities, normal gait, DP pulses intact bilaterally  Neurological: She is alert and oriented to person, place, and time.  Skin: Skin is warm and dry. She is not diaphoretic.  Psychiatric: Her behavior is normal. Her mood appears anxious.  Nursing note and vitals reviewed.   ED Course  Procedures (including critical care time) Labs Review Labs Reviewed  CBC WITH DIFFERENTIAL/PLATELET - Abnormal; Notable for the following:    RDW 19.1 (*)    Monocytes Relative 13 (*)    All other components within normal limits  COMPREHENSIVE METABOLIC PANEL - Abnormal; Notable for the following:    Sodium 133 (*)    Glucose, Bld 145 (*)    BUN 31 (*)    Creatinine, Ser 1.42 (*)    Alkaline Phosphatase 159 (*)    GFR calc non Af Amer 39 (*)    GFR calc Af Amer 45 (*)    All other components within normal limits  CULTURE, BLOOD (ROUTINE X 2)  CULTURE, BLOOD (ROUTINE X 2)  LIPASE, BLOOD  URINALYSIS, ROUTINE W REFLEX MICROSCOPIC  URINE RAPID DRUG SCREEN (HOSP PERFORMED)  I-STAT TROPOININ, ED  I-STAT CG4 LACTIC ACID, ED  I-STAT CG4 LACTIC ACID, ED    Imaging Review Dg Abd Acute W/chest  12/04/2014   CLINICAL DATA:  Nausea, vomiting.  Abdominal pain.  Fever.  EXAM: ACUTE ABDOMEN SERIES (ABDOMEN 2 VIEW & CHEST 1 VIEW)  COMPARISON:  10/01/2014  FINDINGS: Bibasilar opacities. Surgical clips project over the right breast. Cardiomediastinal contours within normal range. Left chest wall Port-A-Cath with thin tube, tip projecting over the proximal SVC however cannot exclude extension to the azygos vein. Moderate to large hiatal hernia. The bowel gas pattern nonspecific, nonobstructive. No free intraperitoneal air identified. Rightward curvature of the lumbar spine. Large stool burden.  IMPRESSION: Bibasilar airspace opacities may reflect atelectasis, aspiration, and/or pneumonia. Recommend  radiographic follow-up in 2-3 weeks to document resolution.  Moderate to large hiatal hernia.  Large stool burden.  No bowel obstruction.  Left chest wall Port-A-Cath with thin tube, tip projecting over the proximal SVC however cannot exclude extension to the azygos vein.   Electronically Signed   By: Carlos Levering M.D.   On: 12/04/2014 22:00     EKG Interpretation   Date/Time:  Friday December 04 2014 18:01:10 EDT Ventricular Rate:  138 PR Interval:  166 QRS Duration: 86 QT Interval:  320 QTC Calculation: 484 R Axis:   41 Text Interpretation:  Sinus tachycardia  vs Atrial flutter 2:1 Possible  Left atrial enlargement Borderline ECG Confirmed by Jeneen Rinks  MD, [place]  [phone]) on 12/04/2014 6:07:08 PM      MDM   Final diagnoses:  Cough  HCAP (healthcare-associated pneumonia)  Tachycardia  Hypoxia  Chronic back pain  Chronic bronchitis, unspecified chronic bronchitis type   64 year old female with a multitude of complaints, notably chronic back pain, subjective fever, and productive cough with increased shortness of breath from her baseline. Back pain is unchanged, no new focal deficits on exam here.  Here patient is afebrile but is notably tachycardic. She is borderline hypoxic.  Lab work as above, normal lactic acid and WBC count.  U/a clean.  Acute abd series without signs of obstruction, appears to have recurrent pneumonia.  Patient has required supplemental O2 here which she intermittently uses at home.  She has had 2L of IVF's but remains tachycardia.  Given hospital admission in January for similar, will cover for HCAP with vancomycin and cefepime, blood cultures sent.  Case discussed with hospitalist, Dr. Blaine Hamper who will admit for further management.  Temp admission orders placed.  Larene Pickett, PA-C 12/04/14 [id number]  Tanna Furry, MD 12/05/14 2215

## 2014-12-04 NOTE — ED Notes (Signed)
Niu, MD at bedside. °

## 2014-12-04 NOTE — H&P (Signed)
[facility] History and Physical  Alexandria [name] OHY:[id number] DOB: 03-26-1951 DOA: 12/04/2014  Referring physician: ED physician PCP: Nyoka Cowden, MD  Specialists:   Chief Complaint: Fever, productive cough and shortness of breath  HPI: Alexandria [name] is a 64 y.o. female with past medical history of hypertension, hyperlipidemia, diastolic congestive heart failure, history of breast cancer 2014 (post status of radiation therapy and lumpectomy), anxiety disorder, chronic bronchitis on home oxygen 2 L, DDD and CKD-II, who presents with fever, productive cough and shortness breath.  Patient has history of chronic bronchitis and recurrent pneumonia. She states that she had 4 pneumonia in the past half year. In the past week, she has fever and worsening cough again. She had temperature 101.8 at one point at home. She coughs up some greenish colored sputum. She has shortness of breath, but no chest pain. She has nausea, but no vomiting, abdominal pain or diarrhea. He does not have sick contact. Patient denies abdominal pain, diarrhea, dysuria, urgency, frequency, hematuria, skin rashes. No unilateral weakness, numbness or tingling sensations. No vision change or hearing loss.  In ED, patient was found to have WBC 5.2, temperature 99.9, tachycardia, AoCKD-II. X-ray of abd/chest showed bibasilar airspace opacities may reflect atelectasis, aspiration, and/or pneumonia per radiologist, and moderate to large hiatal hernia.  Review of Systems: As presented in the history of presenting illness, rest negative.  Where does patient live?  At home Can patient participate in ADLs? little  Allergy:  Allergies  Allergen Reactions  . Contrast Media [Iodinated Diagnostic Agents] Anaphylaxis  . Albuterol Other (See Comments)    Anxious   . Prednisone Other (See Comments)    Pt gets very agitated when she takes high doses of steroids    Past Medical History  Diagnosis Date  . Iron deficiency  anemia   . Status post chemotherapy     4 cycles of Taxotere and cytoxan  . Chronic back pain     "all the way up and down"  . Hx of pulmonary embolus     During c-Section  . Peripheral neuropathic pain   . Obesity     Class 2  . S/P radiation therapy  03/04/2013-04/17/2013    1) Right breast / 50 Gy in 25 fractions/ 2) Right breast boost / 10 Gy in 5 fractions  . Diastolic heart failure   . Hx of radiation therapy 03/04/13- 04/17/13    right breast 50 Gy 25 fractions, right breast boost 10 Gy 5 fractions  . Anxiety   . H/O hiatal hernia   . 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 GAD (generalized anxiety disorder)   . 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 Louretta Shorten  . Lymphedema     bilateral lower extremity  . Complication of anesthesia     "I come out really anxious; need Ativan to ease me out" (09/07/2014)  . Hypertension   . Diastolic dysfunction   . Pneumonia     "at least twice/yr" (09/07/2014)  . Chronic bronchitis     "get it q yr" (09/07/2014)  . On home oxygen therapy     "2L just at night" (09/07/2014)  . History of blood transfusion "3-4"    "never can tell why; don't know where the blood was coming from; doesn't show up in stool or urine"  . Daily headache     "  recently" (09/07/2014)  . DDD (degenerative disc disease), cervical   . DDD (degenerative disc disease), lumbosacral   . DDD (degenerative disc disease), thoracolumbar   . Fibromyalgia   . Hyperlipidemia     Past Surgical History  Procedure Laterality Date  . Cesarean section  6967; 1978; 1980;     "stillborn in 5"  . Partial colectomy N/A 07/04/2014    Procedure: REPAIR OF INCARCERATED INCISIONAL HERNIA WITH MESH;  Surgeon: Excell Seltzer, MD;  Location: [facility];  Service: General;  Laterality: N/A;  . Tonsillectomy    . Appendectomy    . Breast biopsy Right   . Breast  lumpectomy Right   . Inguinal hernia repair Bilateral     "cancer"  . Umbilical hernia repair  X 2  . Dilation and curettage of uterus  "numerous"  . Abdominal hysterectomy    . Foot surgery Right ~ 2013 X 3    "put pins in but pins kept breaking"    Social History:  reports that she has never smoked. She has never used smokeless tobacco. She reports that she does not drink alcohol or use illicit drugs.  Family History:  Family History  Problem Relation Age of Onset  . Parkinson's disease Mother   . Emphysema Maternal Grandfather   . COPD Sister   . CAD Neg Hx      Prior to Admission medications   Medication Sig Start Date End Date Taking? Authorizing Provider  anastrozole (ARIMIDEX) 1 MG tablet TAKE 1 TABLET BY MOUTH EVERY DAY 12/02/14  Yes Heath Lark, MD  atorvastatin (LIPITOR) 20 MG tablet Take 1 tablet (20 mg total) by mouth daily. 06/18/14  Yes Marletta Lor, MD  clotrimazole (LOTRIMIN) 1 % cream Apply topically 2 (two) times daily. Patient taking differently: Apply 1 application topically 2 (two) times daily as needed (for rash).  09/10/14  Yes Grace Bushy Minor, NP  docusate sodium 100 MG CAPS Take 100 mg by mouth 2 (two) times daily. 07/10/14  Yes Megan N Dort, PA-C  furosemide (LASIX) 80 MG tablet Take 1 tablet (80 mg total) by mouth daily. 10/23/14  Yes Lorretta Harp, MD  HYDROcodone-homatropine [facility]) 5-1.5 MG/5ML syrup Take 5 mLs by mouth every 6 (six) hours as needed for cough. 11/09/14  Yes Marletta Lor, MD  HYDROmorphone (DILAUDID) 4 MG tablet Take 1 tablet (4 mg total) by mouth every 6 (six) hours as needed for severe pain (pain). Patient taking differently: Take 4 mg by mouth 5 (five) times daily as needed for severe pain (Pt states she is allowed 5 a day).  07/10/14  Yes Megan N Dort, PA-C  HYDROmorphone HCl 16 MG T24A Take 16 mg by mouth every 12 (twelve) hours.  07/23/14  Yes Historical Provider, MD  lidocaine (LIDODERM) 5 % Place 1-2 patches onto the  skin daily. Remove & Discard patch within 12 hours or as directed by MD   Yes Historical Provider, MD  lidocaine-prilocaine (EMLA) cream Apply 1 application topically as needed (port access). 11/05/14  Yes Marletta Lor, MD  lisinopril (PRINIVIL,ZESTRIL) 5 MG tablet Take 1 tablet (5 mg total) by mouth daily. 09/10/14  Yes Grace Bushy Minor, NP  LORazepam (ATIVAN) 0.5 MG tablet Take 1 tablet (0.5 mg total) by mouth every 8 (eight) hours as needed for anxiety. 10/16/14  Yes Marletta Lor, MD  lubiprostone (AMITIZA) 24 MCG capsule Take 1 capsule (24 mcg total) by mouth 2 (two) times daily with a meal. 04/03/14  Yes Heath Lark, MD  Multiple Vitamins-Minerals (HAIR/SKIN/NAILS PO) Take 1 tablet by mouth every morning.   Yes Historical Provider, MD  potassium chloride (KLOR-CON) 20 MEQ packet Take 40 mEq by mouth daily. 09/10/14  Yes Grace Bushy Minor, NP    Physical Exam: Filed Vitals:   12/04/14 2150 12/04/14 2200 12/04/14 2309 12/05/14 0121  BP: 115/63 111/68 115/74 124/51  Pulse: 123 118 133 116  Temp:    97.6 F (36.4 C)  TempSrc:    Oral  Resp: _0 Height:      Weight:      SpO2: 93% 93%  95%   General: Not in acute distress HEENT:       Eyes: PERRL, EOMI, no scleral icterus       ENT: No discharge from the ears and nose, no pharynx injection, no tonsillar enlargement.        Neck: No JVD, no bruit, no mass felt. Cardiac: S1/S2, RRR, No murmurs, No gallops or rubs Pulm: decreased air movement bilaterally. Mild and diffused wheezing bilaterally. Abd: Soft, nondistended, nontender, no rebound pain, no organomegaly, BS present Ext: Trace leg edema bilaterally. 2+DP/PT pulse bilaterally Musculoskeletal: No joint deformities, erythema, or stiffness, ROM full Skin: No rashes.  Neuro: Alert and oriented X3, cranial nerves II-XII grossly intact, muscle strength 5/5 in all extremeties, sensation to light touch intact.  Psych: Patient is not psychotic, no suicidal or hemocidal  ideation.  Labs on Admission:  Basic Metabolic Panel:  Recent Labs Lab 12/04/14 1815  NA 133*  K 3.7  CL 97  CO2 24  GLUCOSE 145*  BUN 31*  CREATININE 1.42*  CALCIUM 9.1   Liver Function Tests:  Recent Labs Lab 12/04/14 1815  AST 30  ALT 31  ALKPHOS 159*  BILITOT 0.6  PROT 7.0  ALBUMIN 3.6    Recent Labs Lab 12/04/14 1815  LIPASE 26   No results for input(s): AMMONIA in the last 168 hours. CBC:  Recent Labs Lab 12/04/14 1815  WBC 5.2  NEUTROABS 3.2  HGB 14.3  HCT 42.7  MCV 85.6  PLT 195   Cardiac Enzymes: No results for input(s): CKTOTAL, CKMB, CKMBINDEX, TROPONINI in the last 168 hours.  BNP (last 3 results)  Recent Labs  10/01/14 1927  BNP 42.8    ProBNP (last 3 results)  Recent Labs  09/07/14 2245  PROBNP 304.7*    CBG: No results for input(s): GLUCAP in the last 168 hours.  Radiological Exams on Admission: Dg Abd Acute W/chest  12/04/2014   CLINICAL DATA:  Nausea, vomiting.  Abdominal pain.  Fever.  EXAM: ACUTE ABDOMEN SERIES (ABDOMEN 2 VIEW & CHEST 1 VIEW)  COMPARISON:  10/01/2014  FINDINGS: Bibasilar opacities. Surgical clips project over the right breast. Cardiomediastinal contours within normal range. Left chest wall Port-A-Cath with thin tube, tip projecting over the proximal SVC however cannot exclude extension to the azygos vein. Moderate to large hiatal hernia. The bowel gas pattern nonspecific, nonobstructive. No free intraperitoneal air identified. Rightward curvature of the lumbar spine. Large stool burden.  IMPRESSION: Bibasilar airspace opacities may reflect atelectasis, aspiration, and/or pneumonia. Recommend radiographic follow-up in 2-3 weeks to document resolution.  Moderate to large hiatal hernia.  Large stool burden.  No bowel obstruction.  Left chest wall Port-A-Cath with thin tube, tip projecting over the proximal SVC however cannot exclude extension to the azygos vein.   Electronically Signed   By: Carlos Levering M.D.    On: 12/04/2014 22:00  EKG: Independently reviewed.   Assessment/Plan Principal Problem:   HCAP (healthcare-associated pneumonia) Active Problems:   Chronic back pain   Morbid obesity   Anxiety state   Essential hypertension   Breast cancer   Chronic bronchitis   Acute on chronic kidney failure  HCAP: Patient's fever, productive cough and shortness of breath is likely caused by HCAP. His x-ray findings are sistent with bilateral basilar obesity. The etiology for her to have recurrent pneumonia is not clear. We need to rule out any possibility of false traction.   -will admit to tele bed - IV Vancomycin and cefepime  - Urine legionella and S. pneumococcal antigen - IVF: Received 1 L of normal saline bolus in the emergency room, will hold lasix -will hold fluid given diastolic congestive heart failure. - Atrovent q4h (she is allergic to Albuterol). - Follow up blood culture x2, sputum culture and respiratory virus panel, plus Flu pcr - Zofran for Nausea - CT-chest without contrast (she is allergic to contrast)  Diastolic CHF: 2-D echo on 09/09/14 showed EF of 55-60% with grade 1 diastolic dysfunction. Patient is on Lasix 80 mg daily at home. Congestive heart failure is compensated on admission. -Hold Lasix due to AoCKD-II -check BNP  AoCKD-II: Creatinine increased from previous 1.09 to 1.42. BUN 31. Likely due to prerenal -Check renal ultrasound and FeUrea -hold Lasix and lisinopril  Hypertension: Blood pressure is 111/68 on admission. -hold lisinopril. -Will not start blood pressure medications since patient is at risk to become septic.  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 She is s/p of surgery and radiation therapy. She is followed by Dr. Orlene Plum. Currently she is taking anastrozole. -will follow up with oncologist.  -continue anastrozole  Port-A-line: Patient  had a port A-line placement. No signs for infection, but CXR showed tip projecting over the proximal SVC, cannot exclude extension to the azygos vein. -need to call IR to remove it in AM   DVT ppx: SQ Heparin      Code Status: Full code Family Communication: Yes, patient's  hand   at bed side Disposition Plan: Admit to inpatient   Date of Service 12/05/2014    Ivor Costa [facility] Pager [phone]  If 7PM-7AM, please contact night-coverage [email or web address] Password [facility] 12/05/2014, 2:34 AM

## 2014-12-04 NOTE — Progress Notes (Signed)
ANTIBIOTIC CONSULT NOTE - INITIAL  Pharmacy Consult for Cefepime and Vancomycin Indication: rule out pneumonia  Allergies  Allergen Reactions  . Contrast Media [Iodinated Diagnostic Agents] Anaphylaxis  . Albuterol Other (See Comments)    Anxious   . Prednisone Other (See Comments)    Pt gets very agitated when she takes high doses of steroids    Patient Measurements: Height: 5' 7" (170.2 cm) Weight: 239 lb (108.41 kg) IBW/kg (Calculated) : 61.6 Adjusted Body Weight:   Vital Signs: Temp: 98.4 F (36.9 C) (03/18 2052) Temp Source: Rectal (03/18 1916) BP: 111/68 mmHg (03/18 2200) Pulse Rate: 118 (03/18 2200) Intake/Output from previous day:   Intake/Output from this shift:    Labs:  Recent Labs  12/04/14 1815  WBC 5.2  HGB 14.3  PLT 195  CREATININE 1.42*   Estimated Creatinine Clearance: 52.7 mL/min (by C-G formula based on Cr of 1.42). No results for input(s): VANCOTROUGH, VANCOPEAK, VANCORANDOM, GENTTROUGH, GENTPEAK, GENTRANDOM, TOBRATROUGH, TOBRAPEAK, TOBRARND, AMIKACINPEAK, AMIKACINTROU, AMIKACIN in the last 72 hours.   Microbiology: No results found for this or any previous visit (from the past 720 hour(s)).  Medical History: Past Medical History  Diagnosis Date  . Iron deficiency anemia   . Status post chemotherapy     4 cycles of Taxotere and cytoxan  . Chronic back pain     "all the way up and down"  . Hx of pulmonary embolus     During c-Section  . Peripheral neuropathic pain   . Obesity     Class 2  . S/P radiation therapy  03/04/2013-04/17/2013    1) Right breast / 50 Gy in 25 fractions/ 2) Right breast boost / 10 Gy in 5 fractions  . Diastolic heart failure   . Hx of radiation therapy 03/04/13- 04/17/13    right breast 50 Gy 25 fractions, right breast boost 10 Gy 5 fractions  . Anxiety   . H/O hiatal hernia   . Breast cancer 10/02/12    Invasive Ductal Carcinoma of the Right Upper Outer Quadrant - ER (>90%), PR - Neg., Her2 Neu Negative, Ki-67  Unknown  . GAD (generalized anxiety disorder)   . 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 Louretta Shorten  . Lymphedema     bilateral lower extremity  . Complication of anesthesia     "I come out really anxious; need Ativan to ease me out" (09/07/2014)  . Hypertension   . Diastolic dysfunction   . Pneumonia     "at least twice/yr" (09/07/2014)  . Chronic bronchitis     "get it q yr" (09/07/2014)  . On home oxygen therapy     "2L just at night" (09/07/2014)  . History of blood transfusion "3-4"    "never can tell why; don't know where the blood was coming from; doesn't show up in stool or urine"  . Daily headache     "recently" (09/07/2014)  . DDD (degenerative disc disease), cervical   . DDD (degenerative disc disease), lumbosacral   . DDD (degenerative disc disease), thoracolumbar   . Fibromyalgia   . Hyperlipidemia     Medications:   (Not in a hospital admission) Scheduled:  .  HYDROmorphone (DILAUDID) injection  1 mg Intravenous Once   Infusions:  . ceFEPime (MAXIPIME) IV    . vancomycin     Followed by  . [START ON 12/05/2014] vancomycin     Assessment: 64yo female presents with N/V, back pain, and fever for last week. Pharmacy is  consulted to dose cefepime and vancomycin for suspected HCAP. Pt is currently afebrile, WBC wnl, sCr 1.4, LA wnl.  Goal of Therapy:  Vancomycin trough level 15-20 mcg/ml  Plan:  Cefepime 2g IV q24h Vancomycin 1750mg IV load followed by 1250mg q24h Expected duration 7 days with resolution of temperature and/or normalization of WBC Measure antibiotic drug levels at steady state Follow up culture results, renal function, and clinical course   M. , PharmD Clinical Pharmacist Pager [phone] 12/04/2014,10:24 PM  

## 2014-12-04 NOTE — ED Notes (Signed)
Pt placed on 2L of O2 for her decreased oxygen sats. Pt states that she sometimes wears oxygen at home. Pt immediately took the nasal cannula from her nose and placed it in her mouth. Pt is now satting 98%.

## 2014-12-05 ENCOUNTER — Inpatient Hospital Stay (HOSPITAL_COMMUNITY): Payer: Medicare Other

## 2014-12-05 ENCOUNTER — Other Ambulatory Visit (HOSPITAL_COMMUNITY): Payer: Self-pay

## 2014-12-05 DIAGNOSIS — N189 Chronic kidney disease, unspecified: Secondary | ICD-10-CM

## 2014-12-05 DIAGNOSIS — N179 Acute kidney failure, unspecified: Secondary | ICD-10-CM | POA: Diagnosis present

## 2014-12-05 LAB — BASIC METABOLIC PANEL
ANION GAP: 7 (ref 5–15)
BUN: 22 mg/dL (ref 6–23)
CO2: 26 mmol/L (ref 19–32)
CREATININE: 0.99 mg/dL (ref 0.50–1.10)
Calcium: 8.7 mg/dL (ref 8.4–10.5)
Chloride: 100 mmol/L (ref 96–112)
GFR calc non Af Amer: 60 mL/min — ABNORMAL LOW (ref >90)
GFR, EST AFRICAN AMERICAN: 70 mL/min — AB (ref >90)
Glucose, Bld: 119 mg/dL — ABNORMAL HIGH (ref 70–99)
Potassium: 3.5 mmol/L (ref 3.5–5.1)
Sodium: 133 mmol/L — ABNORMAL LOW (ref 135–145)

## 2014-12-05 LAB — CBC
HCT: 38.1 % (ref 36.0–46.0)
Hemoglobin: 12.8 g/dL (ref 12.0–15.0)
MCH: 29.4 pg (ref 26.0–34.0)
MCHC: 33.6 g/dL (ref 30.0–36.0)
MCV: 87.4 fL (ref 78.0–100.0)
Platelets: 145 10*3/uL — ABNORMAL LOW (ref 150–400)
RBC: 4.36 MIL/uL (ref 3.87–5.11)
RDW: 19.1 % — AB (ref 11.5–15.5)
WBC: 4 10*3/uL (ref 4.0–10.5)

## 2014-12-05 LAB — INFLUENZA PANEL BY PCR (TYPE A & B)
H1N1 flu by pcr: NOT DETECTED
Influenza A By PCR: NEGATIVE
Influenza B By PCR: NEGATIVE

## 2014-12-05 LAB — TROPONIN I
Troponin I: <0.03 ng/mL (ref <0.031)
Troponin I: <0.03 ng/mL (ref <0.031)
Troponin I: <0.03 ng/mL (ref <0.031)

## 2014-12-05 LAB — PROTIME-INR
INR: 1.12 (ref 0.00–1.49)
Prothrombin Time: 14.5 seconds (ref 11.6–15.2)

## 2014-12-05 LAB — MRSA PCR SCREENING: MRSA BY PCR: NEGATIVE

## 2014-12-05 LAB — STREP PNEUMONIAE URINARY ANTIGEN: Strep Pneumo Urinary Antigen: NEGATIVE

## 2014-12-05 LAB — CREATININE, URINE, RANDOM: Creatinine, Urine: 120.47 mg/dL

## 2014-12-05 LAB — BRAIN NATRIURETIC PEPTIDE: B NATRIURETIC PEPTIDE 5: 25.5 pg/mL (ref 0.0–100.0)

## 2014-12-05 MED ORDER — POLYETHYLENE GLYCOL 3350 17 G PO PACK
17.0000 g | PACK | Freq: Two times a day (BID) | ORAL | Status: DC
  Administered 2014-12-05 – 2014-12-06 (×2): 17 g via ORAL
  Filled 2014-12-05 (×7): qty 1

## 2014-12-05 MED ORDER — DOCUSATE SODIUM 100 MG PO CAPS
100.0000 mg | ORAL_CAPSULE | Freq: Two times a day (BID) | ORAL | Status: DC
  Administered 2014-12-05 – 2014-12-08 (×8): 100 mg via ORAL
  Filled 2014-12-05 (×12): qty 1

## 2014-12-05 MED ORDER — SODIUM CHLORIDE 0.9 % IV SOLN
INTRAVENOUS | Status: DC
  Administered 2014-12-05: 12:00:00 via INTRAVENOUS

## 2014-12-05 MED ORDER — CETYLPYRIDINIUM CHLORIDE 0.05 % MT LIQD
7.0000 mL | Freq: Two times a day (BID) | OROMUCOSAL | Status: DC
  Administered 2014-12-05 – 2014-12-08 (×7): 7 mL via OROMUCOSAL

## 2014-12-05 MED ORDER — GI COCKTAIL ~~LOC~~
30.0000 mL | Freq: Once | ORAL | Status: AC
Start: 2014-12-05 — End: 2014-12-05
  Administered 2014-12-05: 30 mL via ORAL
  Filled 2014-12-05: qty 30

## 2014-12-05 MED ORDER — ONDANSETRON HCL 4 MG/2ML IJ SOLN: 4.0000 mg | Freq: Three times a day (TID) | INTRAMUSCULAR | Status: DC | PRN

## 2014-12-05 MED ORDER — LIDOCAINE-PRILOCAINE 2.5-2.5 % EX CREA: 1.0000 "application " | TOPICAL_CREAM | CUTANEOUS | Status: DC | PRN

## 2014-12-05 MED ORDER — ANASTROZOLE 1 MG PO TABS
1.0000 mg | ORAL_TABLET | Freq: Every day | ORAL | Status: DC
  Administered 2014-12-05 – 2014-12-08 (×4): 1 mg via ORAL
  Filled 2014-12-05 (×6): qty 1

## 2014-12-05 MED ORDER — LUBIPROSTONE 24 MCG PO CAPS
24.0000 ug | ORAL_CAPSULE | Freq: Two times a day (BID) | ORAL | Status: DC
  Administered 2014-12-05 – 2014-12-08 (×8): 24 ug via ORAL
  Filled 2014-12-05 (×13): qty 1

## 2014-12-05 MED ORDER — LORAZEPAM 0.5 MG PO TABS
0.5000 mg | ORAL_TABLET | Freq: Three times a day (TID) | ORAL | Status: DC | PRN
  Administered 2014-12-05 – 2014-12-08 (×7): 0.5 mg via ORAL
  Filled 2014-12-05 (×9): qty 1

## 2014-12-05 MED ORDER — HYDROMORPHONE HCL 1 MG/ML IJ SOLN
1.0000 mg | INTRAMUSCULAR | Status: DC | PRN
  Administered 2014-12-05 (×3): 1 mg via INTRAVENOUS
  Filled 2014-12-05 (×3): qty 1

## 2014-12-05 MED ORDER — HEPARIN SODIUM (PORCINE) 5000 UNIT/ML IJ SOLN
5000.0000 [IU] | Freq: Three times a day (TID) | INTRAMUSCULAR | Status: DC
  Administered 2014-12-05 – 2014-12-08 (×11): 5000 [IU] via SUBCUTANEOUS
  Filled 2014-12-05 (×15): qty 1

## 2014-12-05 MED ORDER — FLEET ENEMA 7-19 GM/118ML RE ENEM
1.0000 | ENEMA | Freq: Once | RECTAL | Status: DC
  Filled 2014-12-05: qty 1

## 2014-12-05 MED ORDER — LIDOCAINE 5 % EX PTCH
1.0000 | MEDICATED_PATCH | CUTANEOUS | Status: DC
  Administered 2014-12-06 – 2014-12-08 (×3): 2 via TRANSDERMAL
  Filled 2014-12-05 (×7): qty 2

## 2014-12-05 MED ORDER — ADULT MULTIVITAMIN W/MINERALS CH
1.0000 | ORAL_TABLET | Freq: Every morning | ORAL | Status: DC
  Administered 2014-12-05 – 2014-12-08 (×4): 1 via ORAL
  Filled 2014-12-05 (×4): qty 1

## 2014-12-05 MED ORDER — HYDROMORPHONE HCL 1 MG/ML IJ SOLN
1.0000 mg | INTRAMUSCULAR | Status: DC | PRN
  Administered 2014-12-05 – 2014-12-06 (×4): 1 mg via INTRAVENOUS
  Filled 2014-12-05 (×4): qty 1

## 2014-12-05 MED ORDER — ATORVASTATIN CALCIUM 20 MG PO TABS
20.0000 mg | ORAL_TABLET | Freq: Every day | ORAL | Status: DC
  Administered 2014-12-05 – 2014-12-08 (×4): 20 mg via ORAL
  Filled 2014-12-05 (×5): qty 1

## 2014-12-05 MED ORDER — HYDROMORPHONE HCL 1 MG/ML IJ SOLN: 2.0000 mg | Freq: Once | INTRAMUSCULAR | Status: DC

## 2014-12-05 MED ORDER — IPRATROPIUM BROMIDE 0.02 % IN SOLN
0.5000 mg | RESPIRATORY_TRACT | Status: DC
  Administered 2014-12-05: 0.5 mg via RESPIRATORY_TRACT
  Filled 2014-12-05: qty 2.5

## 2014-12-05 MED ORDER — AMLODIPINE BESYLATE 5 MG PO TABS: 5.0000 mg | ORAL_TABLET | Freq: Every day | ORAL | Status: DC

## 2014-12-05 MED ORDER — CLOTRIMAZOLE 1 % EX CREA: 1.0000 "application " | TOPICAL_CREAM | Freq: Two times a day (BID) | CUTANEOUS | Status: DC | PRN

## 2014-12-05 MED ORDER — HYDROCODONE-HOMATROPINE 5-1.5 MG/5ML PO SYRP
5.0000 mL | ORAL_SOLUTION | Freq: Four times a day (QID) | ORAL | Status: DC | PRN
  Administered 2014-12-05: 5 mL via ORAL
  Filled 2014-12-05: qty 5

## 2014-12-05 MED ORDER — OSELTAMIVIR PHOSPHATE 30 MG PO CAPS
30.0000 mg | ORAL_CAPSULE | Freq: Two times a day (BID) | ORAL | Status: DC
  Administered 2014-12-05: 30 mg via ORAL
  Filled 2014-12-05 (×4): qty 1

## 2014-12-05 MED ORDER — LEVALBUTEROL HCL 0.63 MG/3ML IN NEBU
0.6300 mg | INHALATION_SOLUTION | RESPIRATORY_TRACT | Status: DC | PRN
  Administered 2014-12-06: 0.63 mg via RESPIRATORY_TRACT
  Filled 2014-12-05: qty 3

## 2014-12-05 MED ORDER — AMLODIPINE BESYLATE 10 MG PO TABS
10.0000 mg | ORAL_TABLET | Freq: Every day | ORAL | Status: DC
  Filled 2014-12-05: qty 1

## 2014-12-05 NOTE — ED Notes (Signed)
Pt uncomfortable, placed on hospital bed for comfort.

## 2014-12-05 NOTE — Progress Notes (Signed)
PT Cancellation Note  Patient Details Name: Alexandria [name] MRN: [id number] DOB: September 04, 1951   Cancelled Treatment:    Reason Eval/Treat Not Completed: Fatigue/lethargy limiting ability to participate;Patient declined, no reason specified Patient sleeping/lethargic.  States she is too tired to get up today.  Will return tomorrow for PT evaluation.  Kinlee, [place] 12/05/2014, 5:50 PM Carita Pian. Sanjuana Kava, [place] Pager [phone]

## 2014-12-05 NOTE — Progress Notes (Signed)
[facility] PROGRESS NOTE  Alexandria [name] ZOX:[id number] DOB: [birth year] DOA: 12/04/2014 PCP: Nyoka Cowden, MD  Assessment/Plan: HCAP: Patient's fever, productive cough and shortness of breath is likely caused by HCAP.  The etiology for her to have recurrent pneumonia is not clear. - IV Vancomycin and cefepime  - Urine legionella and S. pneumococcal antigen pending.  - Atrovent q4h (she is allergic to Albuterol). - Follow up blood culture x2, sputum culture and respiratory virus panel, plus Flu pcr - Zofran for Nausea - CT-chest without contrast; infiltrates bases.  -refuse to use oxygen at times.   Tachycardia; in setting of hypoxemia. Hr decrease to 100 when she use oxygen.   Diastolic CHF: 2-D echo on 09/09/14 showed EF of 55-60% with grade 1 diastolic dysfunction. Patient is on Lasix 80 mg daily at home. Congestive heart failure is compensated on admission. -Hold Lasix due to AoCKD-II -BNP at 25.   AoCKD-II: Creatinine increased from previous 1.09 to 1.42. BUN 31. Likely due to prerenal -Check renal ultrasound and FeUrea -hold Lasix and lisinopril -IV fluids.   Hypertension: -hold lisinopril and norvasc.  -hold BP medication in setting of infection.   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 She is s/p of surgery and radiation therapy. She is followed by Dr. Orlene Plum. Currently she is taking anastrozole. -will follow up with oncologist outpatient.   -continue anastrozole  Port-A-line: Patient had a port A-line placement. No signs for infection, but CXR showed tip projecting over the proximal SVC, cannot exclude extension to the azygos vein. -IR consulted, for evaluation of port   DVT ppx: SQ Heparin   Code Status: Full Code.  Family Communication:  Disposition Plan: Remain in the step down.   Consultants:  none  Procedures:  Renal  US  Antibiotics:  Vancomycin 3-18  Cefepime 3-18  HPI/Subjective: Sleepy, received ativan at 5 am. She is willing to wear oxygen.  Denies dyspnea and chest pain. No BM in 3 days. Mild abdominal discomfort.   Objective: Filed Vitals:   12/05/14 0530  BP: 119/72  Pulse: 122  Temp:   Resp: 19    Intake/Output Summary (Last 24 hours) at 12/05/14 0719 Last data filed at 12/05/14 0005  Gross per 24 hour  Intake     50 ml  Output      0 ml  Net     50 ml   Filed Weights   12/04/14 1908  Weight: 108.41 kg (239 lb)    Exam:   General: Sleepy, wake up to answer some questions.   Cardiovascular: S 1, S 2 RRR  Respiratory: no wheezing, no crackles.   Abdomen: BS present, soft, distended.   Musculoskeletal: no edema  Data Reviewed: Basic Metabolic Panel:  Recent Labs Lab 12/04/14 1815  NA 133*  K 3.7  CL 97  CO2 24  GLUCOSE 145*  BUN 31*  CREATININE 1.42*  CALCIUM 9.1   Liver Function Tests:  Recent Labs Lab 12/04/14 1815  AST 30  ALT 31  ALKPHOS 159*  BILITOT 0.6  PROT 7.0  ALBUMIN 3.6    Recent Labs Lab 12/04/14 1815  LIPASE 26   No results for input(s): AMMONIA in the last 168 hours. CBC:  Recent Labs Lab 12/04/14 1815  WBC 5.2  NEUTROABS 3.2  HGB 14.3  HCT 42.7  MCV 85.6  PLT 195   Cardiac Enzymes:  Recent Labs Lab  12/05/14 0314  TROPONINI <0.03   BNP (last 3 results)  Recent Labs  10/01/14 1927 12/05/14 0315  BNP 42.8 25.5    ProBNP (last 3 results)  Recent Labs  09/07/14 2245  PROBNP 304.7*    CBG: No results for input(s): GLUCAP in the last 168 hours.  No results found for this or any previous visit (from the past 240 hour(s)).   Studies: Ct Chest Wo Contrast  12/05/2014   CLINICAL DATA:  Recurrent pneumonia.  Fever and worsening cough.  EXAM: CT CHEST WITHOUT CONTRAST  TECHNIQUE: Multidetector CT imaging of the chest was performed following the standard protocol without IV contrast.  COMPARISON:   Radiographs 12/04/2014, CT 07/10/2014  FINDINGS: Minimal basilar patchy and curvilinear opacities are present bilaterally, right greater than left. This is more likely atelectasis and scarring but infection could be present, particularly in the right base were there is somewhat more confluence. There are no effusions. There is a large hiatal hernia with most of the stomach located in the chest. There is no mediastinal or hilar adenopathy. There is a satisfactorily positioned left subclavian central line with tip in the SVC. Upper abdomen is remarkable only for the large hiatal hernia.  IMPRESSION: Minimal basilar opacities bilaterally, more likely atelectatic but a small focus of infectious infiltrate cannot be excluded in the right posterior base. Large hiatal hernia.   Electronically Signed   By: Andreas Newport M.D.   On: 12/05/2014 04:51   Dg Abd Acute W/chest  12/04/2014   CLINICAL DATA:  Nausea, vomiting.  Abdominal pain.  Fever.  EXAM: ACUTE ABDOMEN SERIES (ABDOMEN 2 VIEW & CHEST 1 VIEW)  COMPARISON:  10/01/2014  FINDINGS: Bibasilar opacities. Surgical clips project over the right breast. Cardiomediastinal contours within normal range. Left chest wall Port-A-Cath with thin tube, tip projecting over the proximal SVC however cannot exclude extension to the azygos vein. Moderate to large hiatal hernia. The bowel gas pattern nonspecific, nonobstructive. No free intraperitoneal air identified. Rightward curvature of the lumbar spine. Large stool burden.  IMPRESSION: Bibasilar airspace opacities may reflect atelectasis, aspiration, and/or pneumonia. Recommend radiographic follow-up in 2-3 weeks to document resolution.  Moderate to large hiatal hernia.  Large stool burden.  No bowel obstruction.  Left chest wall Port-A-Cath with thin tube, tip projecting over the proximal SVC however cannot exclude extension to the azygos vein.   Electronically Signed   By: Carlos Levering M.D.   On: 12/04/2014 22:00     Scheduled Meds: . amLODipine  10 mg Oral Daily  . anastrozole  1 mg Oral Daily  . antiseptic oral rinse  7 mL Mouth Rinse BID  . atorvastatin  20 mg Oral Daily  . ceFEPime (MAXIPIME) IV  2 g Intravenous Q24H  . docusate sodium  100 mg Oral BID  . heparin  5,000 Units Subcutaneous 3 times per day  . ipratropium  0.5 mg Nebulization Q4H  . lidocaine  1-2 patch Transdermal Q24H  . lubiprostone  24 mcg Oral BID WC  . multivitamin with minerals  1 tablet Oral q morning - 10a  . vancomycin  1,250 mg Intravenous Q24H   Continuous Infusions:   Principal Problem:   HCAP (healthcare-associated pneumonia) Active Problems:   Chronic back pain   Morbid obesity   Anxiety state   Essential hypertension   Breast cancer   Chronic bronchitis   Acute on chronic kidney failure    Time spent: 35 minutes.     Kurt Hoffmeier A  [facility] Pager [phone]. If 7PM-7AM, please contact night-coverage at [email or web address], password [facility] 12/05/2014, 7:19 AM  LOS: 1 day

## 2014-12-05 NOTE — ED Provider Notes (Signed)
Patient seen and evaluated. History reviewed. Care discussed with Quincy Carnes PA. Patient hypoxemic on exam. Reports temperature at home. Chest x-ray with no infiltrates. Recent admission for pneumonia. After cultures were obtained patient given vancomycin and cefepime. Will be admitted for healthcare associated pneumonia treatment.  Tanna Furry, MD 12/05/14 2215

## 2014-12-06 ENCOUNTER — Encounter (HOSPITAL_COMMUNITY): Payer: Self-pay | Admitting: Radiology

## 2014-12-06 ENCOUNTER — Inpatient Hospital Stay (HOSPITAL_COMMUNITY): Payer: Medicare Other

## 2014-12-06 LAB — UREA NITROGEN, URINE: Urea Nitrogen, Ur: 984 mg/dL

## 2014-12-06 LAB — BASIC METABOLIC PANEL
Anion gap: 6 (ref 5–15)
BUN: 12 mg/dL (ref 6–23)
CALCIUM: 8.9 mg/dL (ref 8.4–10.5)
CO2: 29 mmol/L (ref 19–32)
CREATININE: 0.82 mg/dL (ref 0.50–1.10)
Chloride: 105 mmol/L (ref 96–112)
GFR, EST AFRICAN AMERICAN: 88 mL/min — AB (ref >90)
GFR, EST NON AFRICAN AMERICAN: 76 mL/min — AB (ref >90)
Glucose, Bld: 110 mg/dL — ABNORMAL HIGH (ref 70–99)
Potassium: 3.5 mmol/L (ref 3.5–5.1)
SODIUM: 140 mmol/L (ref 135–145)

## 2014-12-06 LAB — CBC
HEMATOCRIT: 40.2 % (ref 36.0–46.0)
HEMOGLOBIN: 13.2 g/dL (ref 12.0–15.0)
MCH: 29.1 pg (ref 26.0–34.0)
MCHC: 32.8 g/dL (ref 30.0–36.0)
MCV: 88.7 fL (ref 78.0–100.0)
Platelets: 153 10*3/uL (ref 150–400)
RBC: 4.53 MIL/uL (ref 3.87–5.11)
RDW: 19.2 % — ABNORMAL HIGH (ref 11.5–15.5)
WBC: 3.7 10*3/uL — ABNORMAL LOW (ref 4.0–10.5)

## 2014-12-06 LAB — HIV ANTIBODY (ROUTINE TESTING W REFLEX): HIV Screen 4th Generation wRfx: NONREACTIVE

## 2014-12-06 LAB — TSH: TSH: 0.043 u[IU]/mL — ABNORMAL LOW (ref 0.350–4.500)

## 2014-12-06 MED ORDER — DEXTROSE 5 % IV SOLN
1.0000 g | Freq: Three times a day (TID) | INTRAVENOUS | Status: DC
  Administered 2014-12-06 – 2014-12-08 (×7): 1 g via INTRAVENOUS
  Filled 2014-12-06 (×10): qty 1

## 2014-12-06 MED ORDER — PANTOPRAZOLE SODIUM 40 MG PO TBEC
40.0000 mg | DELAYED_RELEASE_TABLET | Freq: Two times a day (BID) | ORAL | Status: DC
  Administered 2014-12-06 – 2014-12-08 (×5): 40 mg via ORAL
  Filled 2014-12-06 (×5): qty 1

## 2014-12-06 MED ORDER — POTASSIUM CHLORIDE 20 MEQ PO PACK: 20.0000 meq | PACK | Freq: Every day | ORAL | Status: DC

## 2014-12-06 MED ORDER — POTASSIUM CHLORIDE CRYS ER 20 MEQ PO TBCR
20.0000 meq | EXTENDED_RELEASE_TABLET | Freq: Every day | ORAL | Status: DC
  Administered 2014-12-06 – 2014-12-07 (×2): 20 meq via ORAL
  Filled 2014-12-06 (×4): qty 1

## 2014-12-06 MED ORDER — KETOROLAC TROMETHAMINE 30 MG/ML IJ SOLN
30.0000 mg | Freq: Once | INTRAMUSCULAR | Status: AC
  Administered 2014-12-07: 30 mg via INTRAVENOUS
  Filled 2014-12-06: qty 1

## 2014-12-06 MED ORDER — FUROSEMIDE 40 MG PO TABS
40.0000 mg | ORAL_TABLET | Freq: Every day | ORAL | Status: DC
  Administered 2014-12-06 – 2014-12-07 (×2): 40 mg via ORAL
  Filled 2014-12-06 (×2): qty 1

## 2014-12-06 MED ORDER — DOCUSATE SODIUM 100 MG PO CAPS: 100.0000 mg | ORAL_CAPSULE | Freq: Two times a day (BID) | ORAL | Status: DC

## 2014-12-06 MED ORDER — HYDROMORPHONE HCL 1 MG/ML IJ SOLN
1.0000 mg | Freq: Once | INTRAMUSCULAR | Status: AC
Start: 2014-12-06 — End: 2014-12-06
  Administered 2014-12-06: 1 mg via INTRAVENOUS
  Filled 2014-12-06: qty 1

## 2014-12-06 MED ORDER — ACETAMINOPHEN 325 MG PO TABS: 650.0000 mg | ORAL_TABLET | Freq: Four times a day (QID) | ORAL | Status: DC | PRN

## 2014-12-06 MED ORDER — HYDROMORPHONE HCL 2 MG PO TABS
4.0000 mg | ORAL_TABLET | ORAL | Status: DC | PRN
  Administered 2014-12-06 – 2014-12-08 (×11): 4 mg via ORAL
  Filled 2014-12-06 (×11): qty 2

## 2014-12-06 MED ORDER — VANCOMYCIN HCL IN DEXTROSE 750-5 MG/150ML-% IV SOLN
750.0000 mg | Freq: Two times a day (BID) | INTRAVENOUS | Status: DC
  Administered 2014-12-06 – 2014-12-08 (×5): 750 mg via INTRAVENOUS
  Filled 2014-12-06 (×7): qty 150

## 2014-12-06 NOTE — Progress Notes (Signed)
Patient had episode of coughing and stating she felt like her throat was swelling up. Checked patients O2 sats and 98% was the read on the monitor. Patient had expiratory wheezing. Advised patient to relax, placed oxygen on patient, advised to take some deep breaths in thru her nose and out her mouth. "Did u have a panic attack, what caused your coughing spell, did u get choked?" "Hell no I didn't have a panic attack, I'm a f---ing nurse." Go get me somebody that will care for me" Charge nurse in room. Respiratory therapist called for a breathing treatment and was in patients room within 10 minutes. "I need my pain medication and my Ativan" PRN medications given and patient had relief.
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
## 2014-12-06 NOTE — Progress Notes (Signed)
Patient arrived on unit from [place] husband at bedside. Skin assessment performed.  Orders reviewed with patient.

## 2014-12-06 NOTE — Progress Notes (Signed)
ANTIBIOTIC CONSULT NOTE - FOLLOW UP  Pharmacy Consult for Vancomycin and Cefepime Indication: pneumonia  Allergies  Allergen Reactions  . Contrast Media [Iodinated Diagnostic Agents] Anaphylaxis  . Albuterol Other (See Comments)    Anxious   . Prednisone Other (See Comments)    Pt gets very agitated when she takes high doses of steroids    Patient Measurements: Height: 5\' 7"  (170.2 cm) Weight: 239 lb (108.41 kg) IBW/kg (Calculated) : 61.6  Vital Signs: Temp: 98.3 F (36.8 C) (03/20 0808) Temp Source: Oral (03/20 0808) Pulse Rate: 94 (03/20 0458) Intake/Output from previous day: 03/19 0701 - 03/20 0700 In: 750 [I.V.:500; IV Piggyback:250] Out: 300 [Urine:300] Intake/Output from this shift:    Labs:  Recent Labs  12/04/14 1815 12/04/14 1928 12/05/14 0917 12/06/14 0450  WBC 5.2  --  4.0 3.7*  HGB 14.3  --  12.8 13.2  PLT 195  --  145* 153  LABCREA  --  120.47  --   --   CREATININE 1.42*  --  0.99 0.82   Estimated Creatinine Clearance: 91.3 mL/min (by C-G formula based on Cr of 0.82). No results for input(s): VANCOTROUGH, VANCOPEAK, VANCORANDOM, GENTTROUGH, GENTPEAK, GENTRANDOM, TOBRATROUGH, TOBRAPEAK, TOBRARND, AMIKACINPEAK, AMIKACINTROU, AMIKACIN in the last 72 hours.   Microbiology: Recent Results (from the past 720 hour(s))  Culture, blood (routine x 2)     Status: None (Preliminary result)   Collection Time: 12/04/14 10:36 PM  Result Value Ref Range Status   Specimen Description BLOOD HAND LEFT  Final   Special Requests BOTTLES DRAWN AEROBIC AND ANAEROBIC 5CC  Final   Culture   Final           BLOOD CULTURE RECEIVED NO GROWTH TO DATE CULTURE WILL BE HELD FOR 5 DAYS BEFORE ISSUING A FINAL NEGATIVE REPORT Performed at [facility]    Report Status PENDING  Incomplete  Culture, blood (routine x 2)     Status: None (Preliminary result)   Collection Time: 12/04/14 10:41 PM  Result Value Ref Range Status   Specimen Description BLOOD WRIST LEFT  Final    Special Requests BOTTLES DRAWN AEROBIC AND ANAEROBIC 5CC  Final   Culture   Final           BLOOD CULTURE RECEIVED NO GROWTH TO DATE CULTURE WILL BE HELD FOR 5 DAYS BEFORE ISSUING A FINAL NEGATIVE REPORT Performed at [facility]    Report Status PENDING  Incomplete  MRSA PCR Screening     Status: None   Collection Time: 12/05/14  4:52 AM  Result Value Ref Range Status   MRSA by PCR NEGATIVE NEGATIVE Final    Comment:        The GeneXpert MRSA Assay (FDA approved for NASAL specimens only), is one component of a comprehensive MRSA colonization surveillance program. It is not intended to diagnose MRSA infection nor to guide or monitor treatment for MRSA infections.     Anti-infectives    Start     Dose/Rate Route Frequency Ordered Stop   12/06/14 1200  ceFEPIme (MAXIPIME) 1 g in dextrose 5 % 50 mL IVPB     1 g 100 mL/hr over 30 Minutes Intravenous 3 times per day 12/06/14 0949     12/06/14 1100  vancomycin (VANCOCIN) IVPB 750 mg/150 ml premix     750 mg 150 mL/hr over 60 Minutes Intravenous Every 12 hours 12/06/14 0947     12/05/14 2300  vancomycin (VANCOCIN) 1,250 mg in sodium chloride 0.9 % 250  mL IVPB  Status:  Discontinued     1,250 mg 166.7 mL/hr over 90 Minutes Intravenous Every 24 hours 12/04/14 2230 12/06/14 0947   12/05/14 1000  oseltamivir (TAMIFLU) capsule 30 mg  Status:  Discontinued     30 mg Oral 2 times daily 12/05/14 0723 12/06/14 0810   12/04/14 2300  vancomycin (VANCOCIN) 1,750 mg in sodium chloride 0.9 % 500 mL IVPB     1,750 mg 250 mL/hr over 120 Minutes Intravenous  Once 12/04/14 2230 12/05/14 0252   12/04/14 2230  ceFEPIme (MAXIPIME) 2 g in dextrose 5 % 50 mL IVPB  Status:  Discontinued     2 g 100 mL/hr over 30 Minutes Intravenous Every 24 hours 12/04/14 2228 12/06/14 0949      Assessment: 64 yo F presents with N/V, back pain, and fever for last week. Pharmacy to dose vancomycin and cefepime for HCAP. Now day #3 for HCAP. Pt is afebrile,  WBC low at 3.7. SCr improved to 0.82. Normalized CrCl ~44ml/min.  Goal of Therapy:  Vancomycin trough level 15-20 mcg/ml Resolution of infection  Plan:  Change cefepime to 1g IV Q8 Increase vancomycin to 750mg  IV Q12 Monitor renal function, clinical picture, VT at Css F/U C&S, LOT  Baer Hinton J 12/06/2014,9:50 AM

## 2014-12-06 NOTE — Progress Notes (Signed)
Patient stated she needed more pain medication. Dilaudid 1mg  IV q 3hrs ordered. "I need it every 2 hours." "I want my home medications Dilaudid 4mg  5times daily and Dilaudid XR 16mg  BID." MD paged for patients request. New order received for Dilaudid 1mg  q2hrs.  Advised patient of the change in scheduling and she was pleased with the change. Will continue to medicate as requested by patient. Back issues are the main discomfort site. Monitoring closely.
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
## 2014-12-06 NOTE — Progress Notes (Signed)
Patient refusing abdominal CT scan.  Reason for scan explained and patient states, "My hospital bill is high enough without adding on tests I don't need.".  MD informed of patient's decision to refuse test.  Rational for test was explained in depth.

## 2014-12-06 NOTE — Progress Notes (Signed)
[facility] PROGRESS NOTE  PRINCES FINGER CBJ:[id number] DOB: Apr 14, 1951 DOA: 12/04/2014 PCP: Nyoka Cowden, MD  Assessment/Plan: HCAP: Patient's fever, productive cough and shortness of breath is likely caused by HCAP.  The etiology for her to have recurrent pneumonia is not clear. - IV Vancomycin and cefepime  - Urine legionella pending and S. pneumococcal antigen negative.  - Atrovent q4h (she is allergic to Albuterol). - Follow up blood culture x2 no growth to date, influenza negative.  - Zofran for Nausea - CT-chest without contrast; infiltrates bases.  -refuse to use oxygen at times.  -HIV pending.   Tachycardia; in setting of hypoxemia. Hr decrease to 100 when she use oxygen.  Abdominal pain; had BM yesterday. Pain on palpation. Will check CT abdomen and pelvis.   Right LE cellulitis; continue with antibiotics.   Low TSH; check Free T 3 and Free T4.   Diastolic CHF: 2-D echo on 09/09/14 showed EF of 55-60% with grade 1 diastolic dysfunction. Patient is on Lasix 80 mg daily at home. Congestive heart failure is compensated on admission. -Lasix ON due to AoCKD-II -BNP at 25.  -nsl FLUIDS.  -Resume lasix.   AoCKD-II: Creatinine increased from previous 1.09 to 1.42. BUN 31. Likely due to prerenal -renal ultrasound no acute finding.  -hold Lasix and lisinopril -IV fluids.  -improved with IV fluids.   Hypertension: -hold lisinopril and norvasc.  -Continue to hold BP medication in setting of infection.   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 She is s/p of surgery and radiation therapy. She is followed by Dr. Orlene Plum. Currently she is taking anastrozole. -will follow up with oncologist outpatient.   -continue anastrozole  Port-A-line: Patient had a port A-line placement. No signs for infection, but CXR showed tip projecting over the proximal SVC, cannot  exclude extension to the azygos vein. -Per IR port in right position.  -Per patient she was suppose to have port removed. Will consult IR again.     DVT ppx: SQ Heparin   Code Status: Full Code.  Family Communication:  Disposition Plan; transfer to med-surgery. PT, OT consult.    Consultants:  none  Procedures:  Renal US  Antibiotics:  Vancomycin 3-18  Cefepime 3-18  HPI/Subjective: She is feeling ok, would like home pain medication to be resume. She relates abdominal pain for last month, generalized pain on palpation.  She is suppose to have port removed.  Breathing ok. Relates leg pain.   Objective: Filed Vitals:   12/06/14 0458  BP:   Pulse: 94  Temp:   Resp: 19    Intake/Output Summary (Last 24 hours) at 12/06/14 0804 Last data filed at 12/06/14 0600  Gross per 24 hour  Intake    750 ml  Output    300 ml  Net    450 ml   Filed Weights   12/04/14 1908  Weight: 108.41 kg (239 lb)    Exam:   General: Alert in no distress.   Cardiovascular: S 1, S 2 RRR  Respiratory: no wheezing, no crackles.   Abdomen: BS present, soft, distended.   Musculoskeletal: no edema, redness BL extremity worse right leg.   Data Reviewed: Basic Metabolic Panel:  Recent Labs Lab 12/04/14 1815 12/05/14 0917 12/06/14 0450  NA 133* 133* 140  K 3.7 3.5 3.5  CL 97 100 105  CO2 24 26 29   GLUCOSE 145* 119* 110*  BUN 31* 22 12  CREATININE 1.42* 0.99 0.82  CALCIUM 9.1 8.7 8.9   Liver Function Tests:  Recent Labs Lab 12/04/14 1815  AST 30  ALT 31  ALKPHOS 159*  BILITOT 0.6  PROT 7.0  ALBUMIN 3.6    Recent Labs Lab 12/04/14 1815  LIPASE 26   No results for input(s): AMMONIA in the last 168 hours. CBC:  Recent Labs Lab 12/04/14 1815 12/05/14 0917 12/06/14 0450  WBC 5.2 4.0 3.7*  NEUTROABS 3.2  --   --   HGB 14.3 12.8 13.2  HCT 42.7 38.1 40.2  MCV 85.6 87.4 88.7  PLT 195 145* 153   Cardiac Enzymes:  Recent Labs Lab 12/05/14 0314  12/05/14 0917 12/05/14 1545  TROPONINI <0.03 <0.03 <0.03   BNP (last 3 results)  Recent Labs  10/01/14 1927 12/05/14 0315  BNP 42.8 25.5    ProBNP (last 3 results)  Recent Labs  09/07/14 2245  PROBNP 304.7*    CBG: No results for input(s): GLUCAP in the last 168 hours.  Recent Results (from the past 240 hour(s))  Culture, blood (routine x 2)     Status: None (Preliminary result)   Collection Time: 12/04/14 10:36 PM  Result Value Ref Range Status   Specimen Description BLOOD HAND LEFT  Final   Special Requests BOTTLES DRAWN AEROBIC AND ANAEROBIC 5CC  Final   Culture   Final           BLOOD CULTURE RECEIVED NO GROWTH TO DATE CULTURE WILL BE HELD FOR 5 DAYS BEFORE ISSUING A FINAL NEGATIVE REPORT Performed at [facility]    Report Status PENDING  Incomplete  Culture, blood (routine x 2)     Status: None (Preliminary result)   Collection Time: 12/04/14 10:41 PM  Result Value Ref Range Status   Specimen Description BLOOD WRIST LEFT  Final   Special Requests BOTTLES DRAWN AEROBIC AND ANAEROBIC 5CC  Final   Culture   Final           BLOOD CULTURE RECEIVED NO GROWTH TO DATE CULTURE WILL BE HELD FOR 5 DAYS BEFORE ISSUING A FINAL NEGATIVE REPORT Performed at [facility]    Report Status PENDING  Incomplete  MRSA PCR Screening     Status: None   Collection Time: 12/05/14  4:52 AM  Result Value Ref Range Status   MRSA by PCR NEGATIVE NEGATIVE Final    Comment:        The GeneXpert MRSA Assay (FDA approved for NASAL specimens only), is one component of a comprehensive MRSA colonization surveillance program. It is not intended to diagnose MRSA infection nor to guide or monitor treatment for MRSA infections.      Studies: Ct Chest Wo Contrast  12/05/2014   CLINICAL DATA:  Recurrent pneumonia.  Fever and worsening cough.  EXAM: CT CHEST WITHOUT CONTRAST  TECHNIQUE: Multidetector CT imaging of the chest was performed following the standard protocol  without IV contrast.  COMPARISON:  Radiographs 12/04/2014, CT 07/10/2014  FINDINGS: Minimal basilar patchy and curvilinear opacities are present bilaterally, right greater than left. This is more likely atelectasis and scarring but infection could be present, particularly in the right base were there is somewhat more confluence. There are no effusions. There is a large hiatal hernia with most of the stomach located in the chest. There is no mediastinal or hilar adenopathy. There is a satisfactorily positioned left subclavian central line with tip in the SVC. Upper abdomen is remarkable only for  the large hiatal hernia.  IMPRESSION: Minimal basilar opacities bilaterally, more likely atelectatic but a small focus of infectious infiltrate cannot be excluded in the right posterior base. Large hiatal hernia.   Electronically Signed   By: Andreas Newport M.D.   On: 12/05/2014 04:51   US Renal  12/05/2014   CLINICAL DATA:  Acute renal injury, hypertension, obesity  EXAM: RENAL/URINARY TRACT ULTRASOUND COMPLETE  COMPARISON:  10/01/2004  FINDINGS: Right Kidney:  Length: 10.2 cm. Echogenicity within normal limits. No mass or hydronephrosis visualized.  Left Kidney:  Length: 10.1 cm. Limited visualization because of obscuring bowel gas and high position beneath the left posterior ribs. Echogenicity within normal limits. No mass or hydronephrosis visualized.  Bladder:  Appears normal for degree of bladder distention.  Included views of the gallbladder demonstrate a small hypoechoic non shadowing focus along the gallbladder wall measuring 5 mm compatible with a polyp.  IMPRESSION: No acute finding or hydronephrosis. Limited assessment of the left kidney.   Electronically Signed   By: Jerilynn Mages.  Shick M.D.   On: 12/05/2014 09:45   Dg Abd Acute W/chest  12/04/2014   CLINICAL DATA:  Nausea, vomiting.  Abdominal pain.  Fever.  EXAM: ACUTE ABDOMEN SERIES (ABDOMEN 2 VIEW & CHEST 1 VIEW)  COMPARISON:  10/01/2014  FINDINGS: Bibasilar  opacities. Surgical clips project over the right breast. Cardiomediastinal contours within normal range. Left chest wall Port-A-Cath with thin tube, tip projecting over the proximal SVC however cannot exclude extension to the azygos vein. Moderate to large hiatal hernia. The bowel gas pattern nonspecific, nonobstructive. No free intraperitoneal air identified. Rightward curvature of the lumbar spine. Large stool burden.  IMPRESSION: Bibasilar airspace opacities may reflect atelectasis, aspiration, and/or pneumonia. Recommend radiographic follow-up in 2-3 weeks to document resolution.  Moderate to large hiatal hernia.  Large stool burden.  No bowel obstruction.  Left chest wall Port-A-Cath with thin tube, tip projecting over the proximal SVC however cannot exclude extension to the azygos vein.   Electronically Signed   By: Carlos Levering M.D.   On: 12/04/2014 22:00    Scheduled Meds: . anastrozole  1 mg Oral Daily  . antiseptic oral rinse  7 mL Mouth Rinse BID  . atorvastatin  20 mg Oral Daily  . ceFEPime (MAXIPIME) IV  2 g Intravenous Q24H  . docusate sodium  100 mg Oral BID  . heparin  5,000 Units Subcutaneous 3 times per day  .  HYDROmorphone (DILAUDID) injection  2 mg Intravenous Once  . lidocaine  1-2 patch Transdermal Q24H  . lubiprostone  24 mcg Oral BID WC  . multivitamin with minerals  1 tablet Oral q morning - 10a  . oseltamivir  30 mg Oral BID  . polyethylene glycol  17 g Oral BID  . sodium phosphate  1 enema Rectal Once  . vancomycin  1,250 mg Intravenous Q24H   Continuous Infusions: . sodium chloride 50 mL/hr at 12/05/14 1158    Principal Problem:   HCAP (healthcare-associated pneumonia) Active Problems:   Chronic back pain   Morbid obesity   Anxiety state   Essential hypertension   Breast cancer   Chronic bronchitis   Acute on chronic kidney failure    Time spent: 35 minutes.     Niel Hummer A  [facility] Pager [phone]. If 7PM-7AM, please contact  night-coverage at [email or web address], password [facility] 12/06/2014, 8:04 AM  LOS: 2 days

## 2014-12-06 NOTE — Progress Notes (Signed)
Order for [employer] removal. Per pt, placed 2 years ago in [place]. Treated for breast cancer complete, had order to remove as outpt but pt now admitted with PNA. Getting better daily.  Discussion with pt that IR can remove, but prefer to allow her to recover from PNA, to reduce risk of perioperative infection, and be scheduled as an outpt in the next 1-2 weeks. Blood culture (-) so far and no signs of local port infection. She is agreeable to this plan.  Ascencion Dike PA-C Interventional Radiology 12/06/2014 9:40 AM

## 2014-12-06 NOTE — Progress Notes (Signed)
Patient stated she would do the CT of the abdomin if she doesn't have to drink the contrast due to she will throw it up.   Spoke with Dr. Joycelyn Man to do test without contrast.

## 2014-12-06 NOTE — Evaluation (Signed)
Physical Therapy Evaluation Patient Details Name: Alexandria [name] MRN: [id number] DOB: May 26, 1951 Today's Date: 12/06/2014   History of Present Illness  Patient is a 64 yo female admitted 12/04/14 with HCAP, hypoxemia, tachycardia.  PMH:  LT lymphadema, HTN, HLD, CHF, breast CA, anxiety, depression, DDD, CKD, peripheral neurogenic pain, fibromyalgia  Clinical Impression  Patient presents with problems listed below.  Will benefit from acute PT to maximize independence prior to discharge home with husband.      Follow Up Recommendations Home health PT;Supervision/Assistance - 24 hour (Lillian for assist with ADL's)    Equipment Recommendations  Rolling walker with 5" wheels;3in1 (PT)    Recommendations for Other Services       Precautions / Restrictions Precautions Precautions: Fall Restrictions Weight Bearing Restrictions: No      Mobility  Bed Mobility Overal bed mobility: Modified Independent             General bed mobility comments: Patient using bed rail and requiring increased time  Transfers Overall transfer level: Needs assistance Equipment used: None Transfers: Sit to/from Stand;Stand Pivot Transfers Sit to Stand: Min assist Stand pivot transfers: Min assist       General transfer comment: Verbal cues for hand placement and technique.  Assist to power up to standing and for balance on standing.  Patient able to take shuffle steps to pivot to chair with min assist for balance.  Ambulation/Gait                Stairs            Wheelchair Mobility    Modified Rankin (Stroke Patients Only)       Balance Overall balance assessment: Needs assistance         Standing balance support: Single extremity supported Standing balance-Leahy Scale: Poor                               Pertinent Vitals/Pain Pain Assessment: 0-10 Pain Score: 9  Pain Location: Back Pain Descriptors / Indicators: Aching Pain Intervention(s):  Limited activity within patient's tolerance;Repositioned    Home Living Family/patient expects to be discharged to:: Private residence Living Arrangements: Spouse/significant other Available Help at Discharge: Family;Available 24 hours/day Type of Home: Apartment Home Access: Level entry     Home Layout: One level Home Equipment: Cane - single point      Prior Function Level of Independence: Independent with assistive device(s);Needs assistance   Gait / Transfers Assistance Needed: Uses cane for ambulation  ADL's / Homemaking Assistance Needed: Assist for reaching feet to put on socks        Hand Dominance        Extremity/Trunk Assessment   Upper Extremity Assessment: Overall WFL for tasks assessed           Lower Extremity Assessment: Generalized weakness (Noted BLE edema)         Communication   Communication: No difficulties  Cognition Arousal/Alertness: Lethargic ("sleepy") Behavior During Therapy: Flat affect Overall Cognitive Status: Within Functional Limits for tasks assessed                      General Comments General comments (skin integrity, edema, etc.): Edema noted bilateral lower legs.    Exercises        Assessment/Plan    PT Assessment Patient needs continued PT services  PT Diagnosis Difficulty walking;Abnormality of gait;Generalized weakness;Acute pain   PT  Problem List Decreased strength;Decreased activity tolerance;Decreased balance;Decreased mobility;Decreased knowledge of use of DME;Cardiopulmonary status limiting activity;Obesity;Pain  PT Treatment Interventions DME instruction;Gait training;Functional mobility training;Therapeutic activities;Therapeutic exercise;Patient/family education   PT Goals (Current goals can be found in the Care Plan section) Acute Rehab PT Goals Patient Stated Goal: To get stronger.  To sleep PT Goal Formulation: With patient Time For Goal Achievement: 12/13/14 Potential to Achieve Goals:  Good    Frequency Min 3X/week   Barriers to discharge        Co-evaluation               End of Session Equipment Utilized During Treatment: Gait belt Activity Tolerance: Patient limited by fatigue Patient left: in chair;with call bell/phone within reach Nurse Communication: Mobility status         Time: 1561-5379 PT Time Calculation (min) (ACUTE ONLY): 13 min   Charges:   PT Evaluation $Initial PT Evaluation Tier I: 1 Procedure     PT G CodesOctavie, [place] 2014/12/28, 9:09 AM Carita Pian. Sanjuana Kava, [place] Pager [phone]

## 2014-12-06 NOTE — Progress Notes (Signed)
Patient stated she didn't see a physican yesterday at all, "nobody came here to c me." Read notes on patient's chart and physician did see patient. Advised patient Dr did come to see her, husband and patient stated"he probably just wrote a note without seeing her"."I'm going to check my bill and see if I get charged, I'm not paying it."

## 2014-12-07 DIAGNOSIS — R109 Unspecified abdominal pain: Secondary | ICD-10-CM

## 2014-12-07 LAB — CBC
HCT: 38.8 % (ref 36.0–46.0)
Hemoglobin: 13 g/dL (ref 12.0–15.0)
MCH: 29.2 pg (ref 26.0–34.0)
MCHC: 33.5 g/dL (ref 30.0–36.0)
MCV: 87.2 fL (ref 78.0–100.0)
Platelets: 138 10*3/uL — ABNORMAL LOW (ref 150–400)
RBC: 4.45 MIL/uL (ref 3.87–5.11)
RDW: 19 % — AB (ref 11.5–15.5)
WBC: 3.3 10*3/uL — ABNORMAL LOW (ref 4.0–10.5)

## 2014-12-07 LAB — T3, FREE: T3, Free: 3.7 pg/mL (ref 2.0–4.4)

## 2014-12-07 LAB — LEGIONELLA ANTIGEN, URINE

## 2014-12-07 LAB — BASIC METABOLIC PANEL
Anion gap: 6 (ref 5–15)
BUN: 8 mg/dL (ref 6–23)
CALCIUM: 8.9 mg/dL (ref 8.4–10.5)
CO2: 28 mmol/L (ref 19–32)
Chloride: 104 mmol/L (ref 96–112)
Creatinine, Ser: 0.87 mg/dL (ref 0.50–1.10)
GFR calc Af Amer: 82 mL/min — ABNORMAL LOW (ref >90)
GFR, EST NON AFRICAN AMERICAN: 70 mL/min — AB (ref >90)
GLUCOSE: 99 mg/dL (ref 70–99)
Potassium: 3.2 mmol/L — ABNORMAL LOW (ref 3.5–5.1)
Sodium: 138 mmol/L (ref 135–145)

## 2014-12-07 LAB — T4, FREE: FREE T4: 1.73 ng/dL (ref 0.80–1.80)

## 2014-12-07 MED ORDER — HYDROMORPHONE HCL 1 MG/ML IJ SOLN
1.0000 mg | Freq: Once | INTRAMUSCULAR | Status: AC
  Administered 2014-12-07: 1 mg via INTRAVENOUS
  Filled 2014-12-07: qty 1

## 2014-12-07 MED ORDER — POTASSIUM CHLORIDE CRYS ER 20 MEQ PO TBCR
40.0000 meq | EXTENDED_RELEASE_TABLET | Freq: Once | ORAL | Status: AC
  Administered 2014-12-07: 40 meq via ORAL
  Filled 2014-12-07: qty 2

## 2014-12-07 MED ORDER — CARVEDILOL 3.125 MG PO TABS: 3.1250 mg | ORAL_TABLET | Freq: Two times a day (BID) | ORAL | Status: DC

## 2014-12-07 MED ORDER — HYDROMORPHONE HCL 1 MG/ML IJ SOLN
1.0000 mg | Freq: Two times a day (BID) | INTRAMUSCULAR | Status: DC | PRN
  Administered 2014-12-08: 1 mg via INTRAVENOUS
  Filled 2014-12-07: qty 1

## 2014-12-07 MED ORDER — FUROSEMIDE 40 MG PO TABS
40.0000 mg | ORAL_TABLET | Freq: Two times a day (BID) | ORAL | Status: DC
  Administered 2014-12-07 – 2014-12-08 (×2): 40 mg via ORAL
  Filled 2014-12-07 (×4): qty 1

## 2014-12-07 MED ORDER — CARVEDILOL 3.125 MG PO TABS
3.1250 mg | ORAL_TABLET | Freq: Two times a day (BID) | ORAL | Status: DC
  Administered 2014-12-07 – 2014-12-08 (×4): 3.125 mg via ORAL
  Filled 2014-12-07 (×5): qty 1

## 2014-12-07 NOTE — Progress Notes (Signed)
Patient requesting Toradol for pain and wants it given with IV Dilaudid.  MD notified.

## 2014-12-07 NOTE — Progress Notes (Signed)
[facility] PROGRESS NOTE  Alexandria [name] MEQ:[id number] DOB: [birth year] DOA: 12/04/2014 PCP: Nyoka Cowden, MD  Assessment/Plan: HCAP: Patient's fever, productive cough and shortness of breath is likely caused by HCAP.  The etiology for her to have recurrent pneumonia is not clear. - IV Vancomycin and cefepime  - Urine legionella pending and S. pneumococcal antigen negative.  - Atrovent q4h (she is allergic to Albuterol). - Follow up blood culture x2 no growth to date, influenza negative.  - Zofran for Nausea - CT-chest without contrast; infiltrates bases.  -refuse to use oxygen at times.  -HIV negative  Tachycardia; she relates prior history of tachycardia. I will start low dose coreg.   Abdominal pain; had BM yesterday. Pain on palpation.  CT abdomen and pelvis only showed hiatal hernia. Will consult GI.   Right LE cellulitis; continue with antibiotics.   Low TSH;  Free T 3 and Free T4 normal. Need repeat TSH in 4 weeks.   Diastolic CHF: 2-D echo on 09/09/14 showed EF of 55-60% with grade 1 diastolic dysfunction. Patient is on Lasix 80 mg daily at home. Congestive heart failure is compensated on admission. -Lasix ON due to AoCKD-II -BNP at 25.  -nsl FLUIDS.  -increase lasix to home dose.   AoCKD-II: Creatinine increased from previous 1.09 to 1.42. BUN 31. Likely due to prerenal -renal ultrasound no acute finding.  -hold Lasix and lisinopril NSL fluids. Resume lasix.   Hypertension: -hold lisinopril and norvasc.  -Continue to hold BP medication in setting of infection.   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 She is s/p of surgery and radiation therapy. She is followed by Dr. Orlene Plum. Currently she is taking anastrozole. -will follow up with oncologist outpatient.   -continue anastrozole  Port-A-line: Patient had a port A-line placement. No signs for  infection, but CXR showed tip projecting over the proximal SVC, cannot exclude extension to the azygos vein. -Per IR port in right position.  -Per patient she was suppose to have port removed.  Needs to follow up outpatient for port removal.     DVT ppx: SQ Heparin   Code Status: Full Code.  Family Communication: care discussed with husband Disposition Plan; evaluation of abdominal pain.    Consultants:  none  Procedures:  Renal US  Antibiotics:  Vancomycin 3-18  Cefepime 3-18  HPI/Subjective: Still with abdominal pain, she has had BM, hard stool.  Still complaining of her chronic back pain.   Objective: Filed Vitals:   12/07/14 0824  BP: 127/83  Pulse: 129  Temp: 97.6 F (36.4 C)  Resp: 18    Intake/Output Summary (Last 24 hours) at 12/07/14 1225 Last data filed at 12/07/14 0856  Gross per 24 hour  Intake    480 ml  Output      0 ml  Net    480 ml   Filed Weights   12/04/14 1908 12/06/14 2100  Weight: 108.41 kg (239 lb) 109.181 kg (240 lb 11.2 oz)    Exam:   General: Alert in no distress.   Cardiovascular: S 1, S 2 RRR  Respiratory: no wheezing, no crackles.   Abdomen: BS present, soft, distended.   Musculoskeletal: trace edema.   Data Reviewed: Basic Metabolic Panel:  Recent Labs Lab 12/04/14 1815 12/05/14 0917 12/06/14 0450 12/07/14 0719  NA 133* 133* 140 138  K 3.7 3.5 3.5 3.2*  CL 97 100 105  104  CO2 24 26 29 28   GLUCOSE 145* 119* 110* 99  BUN 31* 22 12 8   CREATININE 1.42* 0.99 0.82 0.87  CALCIUM 9.1 8.7 8.9 8.9   Liver Function Tests:  Recent Labs Lab 12/04/14 1815  AST 30  ALT 31  ALKPHOS 159*  BILITOT 0.6  PROT 7.0  ALBUMIN 3.6    Recent Labs Lab 12/04/14 1815  LIPASE 26   No results for input(s): AMMONIA in the last 168 hours. CBC:  Recent Labs Lab 12/04/14 1815 12/05/14 0917 12/06/14 0450 12/07/14 0719  WBC 5.2 4.0 3.7* 3.3*  NEUTROABS 3.2  --   --   --   HGB 14.3 12.8 13.2 13.0  HCT 42.7  38.1 40.2 38.8  MCV 85.6 87.4 88.7 87.2  PLT 195 145* 153 138*   Cardiac Enzymes:  Recent Labs Lab 12/05/14 0314 12/05/14 0917 12/05/14 1545  TROPONINI <0.03 <0.03 <0.03   BNP (last 3 results)  Recent Labs  10/01/14 1927 12/05/14 0315  BNP 42.8 25.5    ProBNP (last 3 results)  Recent Labs  09/07/14 2245  PROBNP 304.7*    CBG: No results for input(s): GLUCAP in the last 168 hours.  Recent Results (from the past 240 hour(s))  Culture, blood (routine x 2)     Status: None (Preliminary result)   Collection Time: 12/04/14 10:36 PM  Result Value Ref Range Status   Specimen Description BLOOD HAND LEFT  Final   Special Requests BOTTLES DRAWN AEROBIC AND ANAEROBIC 5CC  Final   Culture   Final           BLOOD CULTURE RECEIVED NO GROWTH TO DATE CULTURE WILL BE HELD FOR 5 DAYS BEFORE ISSUING A FINAL NEGATIVE REPORT Performed at [facility]    Report Status PENDING  Incomplete  Culture, blood (routine x 2)     Status: None (Preliminary result)   Collection Time: 12/04/14 10:41 PM  Result Value Ref Range Status   Specimen Description BLOOD WRIST LEFT  Final   Special Requests BOTTLES DRAWN AEROBIC AND ANAEROBIC 5CC  Final   Culture   Final           BLOOD CULTURE RECEIVED NO GROWTH TO DATE CULTURE WILL BE HELD FOR 5 DAYS BEFORE ISSUING A FINAL NEGATIVE REPORT Performed at [facility]    Report Status PENDING  Incomplete  MRSA PCR Screening     Status: None   Collection Time: 12/05/14  4:52 AM  Result Value Ref Range Status   MRSA by PCR NEGATIVE NEGATIVE Final    Comment:        The GeneXpert MRSA Assay (FDA approved for NASAL specimens only), is one component of a comprehensive MRSA colonization surveillance program. It is not intended to diagnose MRSA infection nor to guide or monitor treatment for MRSA infections.      Studies: Ct Abdomen Pelvis Wo Contrast  12/06/2014   CLINICAL DATA:  Patient complains of abdominal distention and pain  with some nausea and vomiting. States dx'd with breast cancer in 2014 and was treated with surgery, chemo, and radiation. Had an incarcerated abdominal hernia with subsequent surgery.  EXAM: CT ABDOMEN AND PELVIS WITHOUT CONTRAST  TECHNIQUE: Multidetector CT imaging of the abdomen and pelvis was performed following the standard protocol without IV contrast.  COMPARISON:  10/01/2014  FINDINGS: Patchy, linear and reticular opacities at the lung bases is likely due to atelectasis, most evident adjacent to a moderate hiatal hernia which bulges against  the left medial lower lobe. Heart is normal in size.  Fatty infiltration of the liver.  No liver mass or focal lesion.  Spleen, gallbladder, pancreas, adrenal glands:  Normal.  No renal masses or stones. No hydronephrosis. Normal ureters. Bladder is minimally distended but otherwise unremarkable.  Uterus is surgically absent.  No pelvic masses.  No pathologically enlarged lymph nodes.  No ascites.  There is scarring along the anterior mid to lower abdominal wall midline.  No colonic or small bowel distention. No evidence of obstruction or significant adynamic ileus. No bowel wall thickening or mesenteric inflammation.  Degenerative changes are noted of the visualized spine. Grade 1 to grade 2 anterolisthesis of L5 on S1. No osteoblastic or osteolytic lesions.  IMPRESSION: 1. No acute findings within the abdomen or pelvis. 2. Colon and small-bowel normal caliber with no evidence of obstruction or significant adynamic ileus. 3. Fatty infiltration of the liver and 4. Moderate hiatal hernia.  Areas of lung base atelectasis. 5. Degenerative changes of the visualized spine with a grade 1 to grade 2 anterolisthesis of L5 on S1, degenerative in origin.   Electronically Signed   By: Lajean Manes M.D.   On: 12/06/2014 14:19    Scheduled Meds: . anastrozole  1 mg Oral Daily  . antiseptic oral rinse  7 mL Mouth Rinse BID  . atorvastatin  20 mg Oral Daily  . carvedilol  3.125  mg Oral BID WC  . ceFEPime (MAXIPIME) IV  1 g Intravenous 3 times per day  . docusate sodium  100 mg Oral BID  . furosemide  40 mg Oral Daily  . heparin  5,000 Units Subcutaneous 3 times per day  . lidocaine  1-2 patch Transdermal Q24H  . lubiprostone  24 mcg Oral BID WC  . multivitamin with minerals  1 tablet Oral q morning - 10a  . pantoprazole  40 mg Oral BID  . polyethylene glycol  17 g Oral BID  . potassium chloride  20 mEq Oral Daily  . sodium phosphate  1 enema Rectal Once  . vancomycin  750 mg Intravenous Q12H   Continuous Infusions:    Principal Problem:   HCAP (healthcare-associated pneumonia) Active Problems:   Chronic back pain   Morbid obesity   Anxiety state   Essential hypertension   Breast cancer   Chronic bronchitis   Acute on chronic kidney failure    Time spent: 35 minutes.     Niel Hummer A  [facility] Pager [phone]. If 7PM-7AM, please contact night-coverage at [email or web address], password [facility] 12/07/2014, 12:25 PM  LOS: 3 days

## 2014-12-07 NOTE — Consult Note (Signed)
[facility] Consult: 12:29 PM 12/07/2014  LOS: 3 days    Referring Provider: Dr Tyrell Antonio  Primary Care Physician:  Nyoka Cowden, MD Primary Gastroenterologist:  none   Reason for Consultation:  Abdominal pain.    HPI: Alexandria [name] is a 64 y.o. female. PMH right, estrogen receptor +,  breast cancer 2014, s/p lumpectomy and chemo/radiation. Hx PE post c-section.  Stable mediastinal adenopathy.  S/p 06/2014 incarcerated ventral hernia repair with mesh.  Fatty liver by CT scan.  Morbid obesity. [place] described as large with consideration for Nissen fundoplication along with bariatric surgery in 07/2014 that was cancelled due to lung issues.   Chronic DOE, attributed to the large [place] by respiratory (CT 01/2014 of her chest that shows at least 50% of her stomach in her left chest, with significant compromise of left lung volume). Grade 1 diastolic dysfunction. Treated with IV iron 09/2014 and 11/2013 for low ferritin/IDA with hx transfusion requiring anemia [place]).  Depression/anxiety.  Fibromyalgia and DJD, chronic back pain, chronic narcotics.  Chronic bil LE edema since chemo/radiation.  Chronic DOE, attributed to her large [facility] per respiratory   Admission 1/14 - 10/06/2014 for HCAP.   CT scan 3/20 shows fatty liver, moderate HH, spinal DJD.    EGDs and Colonoscopies in [place].  Pt says she had confirmation of large HH but no ulcers, no colonic ulcers or diverticulosis.  Has never had capsule endo.    The epigastric pain is more long-standing for at least 3 to 4 years, she attributes this to her HH.   The lower abdominal pain is newer, says it appeared before her ventral hernia repair, but has gotten worse since then.  She has been taking Amitiza for at least 3 years, has variable BMs: 2 x day  vs every 2 to 3 days.  No blood in stool, no diarrhea.  Takes a lot of narcotics. Says she lives on "chocolate milk" and eats very little generally.  The obesity (120# weight gain) started after the chemo/radiation. No dysphagia. No vomiting. Takes 400 Ibuprofen or 440 mg Aleve (2 tabs) daily for back and MS pain despite narcotics and lidoderm patches.   At present not interested in EGD, says she is too tired.  Would consider it down the road.   Past Medical History  Diagnosis Date  . Iron deficiency anemia   . Status post chemotherapy     4 cycles of Taxotere and cytoxan  . Chronic back pain     "all the way up and down"  . Hx of pulmonary embolus     During c-Section  . Peripheral neuropathic pain   . Obesity     Class 2  . S/P radiation therapy  03/04/2013-04/17/2013    1) Right breast / 50 Gy in 25 fractions/ 2) Right breast boost / 10 Gy in 5 fractions  . Diastolic heart failure   . Hx of radiation therapy 03/04/13- 04/17/13    right breast 50 Gy 25 fractions, right breast boost 10 Gy 5 fractions  .  Anxiety   . H/O hiatal hernia   . 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 GAD (generalized anxiety disorder)   . 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 Louretta Shorten  . Lymphedema     bilateral lower extremity  . Complication of anesthesia     "I come out really anxious; need Ativan to ease me out" (09/07/2014)  . Hypertension   . Diastolic dysfunction   . Pneumonia     "at least twice/yr" (09/07/2014)  . Chronic bronchitis     "get it q yr" (09/07/2014)  . On home oxygen therapy     "2L just at night" (09/07/2014)  . History of blood transfusion "3-4"    "never can tell why; don't know where the blood was coming from; doesn't show up in stool or urine"  . Daily headache     "recently" (09/07/2014)  . DDD (degenerative disc disease), cervical   . DDD  (degenerative disc disease), lumbosacral   . DDD (degenerative disc disease), thoracolumbar   . Fibromyalgia   . Hyperlipidemia     Past Surgical History  Procedure Laterality Date  . Cesarean section  1610; 1978; 1980;     "stillborn in 87"  . Partial colectomy N/A 07/04/2014    Procedure: REPAIR OF INCARCERATED INCISIONAL HERNIA WITH MESH;  Surgeon: Excell Seltzer, MD;  Location: [facility];  Service: General;  Laterality: N/A;  . Tonsillectomy    . Appendectomy    . Breast biopsy Right   . Breast lumpectomy Right   . Inguinal hernia repair Bilateral     "cancer"  . Umbilical hernia repair  X 2  . Dilation and curettage of uterus  "numerous"  . Abdominal hysterectomy    . Foot surgery Right ~ 2013 X 3    "put pins in but pins kept breaking"    Prior to Admission medications   Medication Sig Start Date End Date Taking? Authorizing Provider  anastrozole (ARIMIDEX) 1 MG tablet TAKE 1 TABLET BY MOUTH EVERY DAY 12/02/14  Yes Heath Lark, MD  atorvastatin (LIPITOR) 20 MG tablet Take 1 tablet (20 mg total) by mouth daily. 06/18/14  Yes Marletta Lor, MD  clotrimazole (LOTRIMIN) 1 % cream Apply topically 2 (two) times daily. Patient taking differently: Apply 1 application topically 2 (two) times daily as needed (for rash).  09/10/14  Yes Grace Bushy Minor, NP  docusate sodium 100 MG CAPS Take 100 mg by mouth 2 (two) times daily. 07/10/14  Yes Megan N Dort, PA-C  furosemide (LASIX) 80 MG tablet Take 1 tablet (80 mg total) by mouth daily. 10/23/14  Yes Lorretta Harp, MD  HYDROcodone-homatropine [facility]) 5-1.5 MG/5ML syrup Take 5 mLs by mouth every 6 (six) hours as needed for cough. 11/09/14  Yes Marletta Lor, MD  HYDROmorphone (DILAUDID) 4 MG tablet Take 1 tablet (4 mg total) by mouth every 6 (six) hours as needed for severe pain (pain). Patient taking differently: Take 4 mg by mouth 5 (five) times daily as needed for severe pain (Pt states she is allowed 5 a day).  07/10/14  Yes  Megan N Dort, PA-C  HYDROmorphone HCl 16 MG T24A Take 16 mg by mouth every 12 (twelve) hours.  07/23/14  Yes Historical Provider, MD  lidocaine (LIDODERM) 5 % Place 1-2 patches onto the skin daily. Remove & Discard patch within  12 hours or as directed by MD   Yes Historical Provider, MD  lidocaine-prilocaine (EMLA) cream Apply 1 application topically as needed (port access). 11/05/14  Yes Marletta Lor, MD  lisinopril (PRINIVIL,ZESTRIL) 5 MG tablet Take 1 tablet (5 mg total) by mouth daily. 09/10/14  Yes Grace Bushy Minor, NP  LORazepam (ATIVAN) 0.5 MG tablet Take 1 tablet (0.5 mg total) by mouth every 8 (eight) hours as needed for anxiety. 10/16/14  Yes Marletta Lor, MD  lubiprostone (AMITIZA) 24 MCG capsule Take 1 capsule (24 mcg total) by mouth 2 (two) times daily with a meal. 04/03/14  Yes Heath Lark, MD  Multiple Vitamins-Minerals (HAIR/SKIN/NAILS PO) Take 1 tablet by mouth every morning.   Yes Historical Provider, MD  potassium chloride (KLOR-CON) 20 MEQ packet Take 40 mEq by mouth daily. 09/10/14  Yes Grace Bushy Minor, NP    Scheduled Meds: . anastrozole  1 mg Oral Daily  . antiseptic oral rinse  7 mL Mouth Rinse BID  . atorvastatin  20 mg Oral Daily  . carvedilol  3.125 mg Oral BID WC  . ceFEPime (MAXIPIME) IV  1 g Intravenous 3 times per day  . docusate sodium  100 mg Oral BID  . furosemide  40 mg Oral Daily  . heparin  5,000 Units Subcutaneous 3 times per day  . lidocaine  1-2 patch Transdermal Q24H  . lubiprostone  24 mcg Oral BID WC  . multivitamin with minerals  1 tablet Oral q morning - 10a  . pantoprazole  40 mg Oral BID  . polyethylene glycol  17 g Oral BID  . potassium chloride  20 mEq Oral Daily  . sodium phosphate  1 enema Rectal Once  . vancomycin  750 mg Intravenous Q12H   Infusions:   PRN Meds: acetaminophen, clotrimazole, HYDROmorphone, HYDROmorphone, levalbuterol, lidocaine-prilocaine, LORazepam, ondansetron (ZOFRAN) IV   Allergies as of 12/04/2014 -  Review Complete 12/04/2014  Allergen Reaction Noted  . Contrast media [iodinated diagnostic agents] Anaphylaxis 02/19/2013  . Albuterol Other (See Comments) 04/14/2013  . Prednisone Other (See Comments) 04/13/2013    Family History  Problem Relation Age of Onset  . Parkinson's disease Mother   . Emphysema Maternal Grandfather   . COPD Sister   . CAD Neg Hx     History   Social History  . Marital Status: Married    Spouse Name: N/A  . Number of Children: N/A  . Years of Education: N/A   Occupational History  . retired Therapist, sports    Social History Main Topics  . Smoking status: Never Smoker   . Smokeless tobacco: Never Used  . Alcohol Use: No  . Drug Use: No  . Sexual Activity: Not Currently   Other Topics Concern  . Not on file   Social History Narrative    REVIEW OF SYSTEMS: Constitutional:  No exercise due to DOE.   ENT:  No nose bleeds Pulm:  Chronic DOE, no cough.  No home O2. No CPAP CV:  No palpitations, no LE edema.  GU:  No hematuria, no frequency GI:  Per HPI Heme:  Per HPI   Transfusions:  Per HPI Neuro:  No headaches, no peripheral tingling or numbness Derm:  No itching, no rash or sores.  Endocrine:  No sweats or chills.  No polyuria or dysuria Immunization:  Reviewed, 06/2014 flu shot.  Travel:  None beyond local counties in last few months.    PHYSICAL EXAM: Vital signs in last 24 hours: Filed Vitals:  12/07/14 0824  BP: 127/83  Pulse: 129  Temp: 97.6 F (36.4 C)  Resp: 18   Wt Readings from Last 3 Encounters:  12/06/14 240 lb 11.2 oz (109.181 kg)  11/05/14 246 lb (111.585 kg)  10/23/14 251 lb 12.8 oz (114.216 kg)    General: Morbidly obese, looks unwell.  Somewhat uncomfortable Head:  No asymmetry.  No signs of trauma  Eyes:  No icterus, EOMI.  No pallor Ears:  Not HOH  Nose:  No discharge or congestion.  Mouth:  Clear bil.  Good dental repair. Neck:  No mass, no JVD,  No TMG Lungs:  Clear bil.  Overall BS reduced Heart: RR, tachy.   No mrg. Abdomen:  Obese, BS hypoactive.  No HSM or mass.  Well healed scars. No bruits.  Tenderness is mild, diffuse, to lightest of pressure.  No guard or rebound.   Rectal: deferred   Musc/Skeltl: no joint contractures Extremities:  Non-pitting left>right LE edema with some venous stasis discoloration.   Neurologic:  Oriented x 3.  No tremor.  No limb weakness.  Alert.  Skin:  No telangectasia or sores. Some dermatitis in feet and LE.   Tattoos:  none Nodes:  No cervical adenopathy.    Psych:  Anxious, cooperative.    Intake/Output from previous day: 03/20 0701 - 03/21 0700 In: 840 [P.O.:840] Out: -  Intake/Output this shift: Total I/O In: 240 [P.O.:240] Out: -   LAB RESULTS:  Recent Labs  12/05/14 0917 12/06/14 0450 12/07/14 0719  WBC 4.0 3.7* 3.3*  HGB 12.8 13.2 13.0  HCT 38.1 40.2 38.8  PLT 145* 153 138*   BMET Lab Results  Component Value Date   NA 138 12/07/2014   NA 140 12/06/2014   NA 133* 12/05/2014   K 3.2* 12/07/2014   K 3.5 12/06/2014   K 3.5 12/05/2014   CL 104 12/07/2014   CL 105 12/06/2014   CL 100 12/05/2014   CO2 28 12/07/2014   CO2 29 12/06/2014   CO2 26 12/05/2014   GLUCOSE 99 12/07/2014   GLUCOSE 110* 12/06/2014   GLUCOSE 119* 12/05/2014   BUN 8 12/07/2014   BUN 12 12/06/2014   BUN 22 12/05/2014   CREATININE 0.87 12/07/2014   CREATININE 0.82 12/06/2014   CREATININE 0.99 12/05/2014   CALCIUM 8.9 12/07/2014   CALCIUM 8.9 12/06/2014   CALCIUM 8.7 12/05/2014   LFT  Recent Labs  12/04/14 1815  PROT 7.0  ALBUMIN 3.6  AST 30  ALT 31  ALKPHOS 159*  BILITOT 0.6   PT/INR Lab Results  Component Value Date   INR 1.12 12/05/2014   INR 1.05 10/02/2014   INR 1.02 10/01/2014   Hepatitis Panel No results for input(s): HEPBSAG, HCVAB, HEPAIGM, HEPBIGM in the last 72 hours. C-Diff No components found for: CDIFF Lipase     Component Value Date/Time   LIPASE 26 12/04/2014 1815    Drugs of Abuse     Component Value Date/Time     LABOPIA POSITIVE* 12/04/2014 1943   COCAINSCRNUR NONE DETECTED 12/04/2014 1943   LABBENZ POSITIVE* 12/04/2014 1943   AMPHETMU NONE DETECTED 12/04/2014 1943   THCU NONE DETECTED 12/04/2014 1943   LABBARB NONE DETECTED 12/04/2014 1943     RADIOLOGY STUDIES: Ct Abdomen Pelvis Wo Contrast  12/06/2014   CLINICAL DATA:  Patient complains of abdominal distention and pain with some nausea and vomiting. States dx'd with breast cancer in 2014 and was treated with surgery, chemo, and radiation. Had an incarcerated abdominal  hernia with subsequent surgery.  EXAM: CT ABDOMEN AND PELVIS WITHOUT CONTRAST  TECHNIQUE: Multidetector CT imaging of the abdomen and pelvis was performed following the standard protocol without IV contrast.  COMPARISON:  10/01/2014  FINDINGS: Patchy, linear and reticular opacities at the lung bases is likely due to atelectasis, most evident adjacent to a moderate hiatal hernia which bulges against the left medial lower lobe. Heart is normal in size.  Fatty infiltration of the liver.  No liver mass or focal lesion.  Spleen, gallbladder, pancreas, adrenal glands:  Normal.  No renal masses or stones. No hydronephrosis. Normal ureters. Bladder is minimally distended but otherwise unremarkable.  Uterus is surgically absent.  No pelvic masses.  No pathologically enlarged lymph nodes.  No ascites.  There is scarring along the anterior mid to lower abdominal wall midline.  No colonic or small bowel distention. No evidence of obstruction or significant adynamic ileus. No bowel wall thickening or mesenteric inflammation.  Degenerative changes are noted of the visualized spine. Grade 1 to grade 2 anterolisthesis of L5 on S1. No osteoblastic or osteolytic lesions.  IMPRESSION: 1. No acute findings within the abdomen or pelvis. 2. Colon and small-bowel normal caliber with no evidence of obstruction or significant adynamic ileus. 3. Fatty infiltration of the liver and 4. Moderate hiatal hernia.  Areas of  lung base atelectasis. 5. Degenerative changes of the visualized spine with a grade 1 to grade 2 anterolisthesis of L5 on S1, degenerative in origin.   Electronically Signed   By: Lajean Manes M.D.   On: 12/06/2014 14:19    ENDOSCOPIC STUDIES: Per HPI.  Done in [place] and [place].  None in [place]  IMPRESSION:   *  Chronic abdominal pain.  Both epigastric (many years hx and pt attributes to her [facility]), and lower (pt attributes to post ventral hernia sequela) Suspect functional etiology.    *  HH. Was for Nissen in 07/2014 but due to resp issues and incidences of CAP this (along with possible bariatric procedure) was cancelled.    *  Chronic anemia.  Transfusions in past and Iron infusions (as recently as 09/2014).  Dr Alvy Bimler follows.   *  Narcotic requiring chronic spinal and MS pain.   *  Hx right breast cancer: s/p lumpectomy and chemo/radiation.  On Arimidex.   *  Fatty liver  *  Morbid obesity.     PLAN:     *  Per Dr Ardis Hughs.    Azucena Freed  12/07/2014, 12:29 PM Pager: [phone]     ________________________________________________________________________  Velora Heckler GI MD note:  I personally examined the patient, reviewed the data and agree with the assessment and plan described above.  She has chronic abdominal pains, large HH, increasing obesity.  Tells me she had colonoscopy and EGD 2 years ago in [place].  CT scan yesterday shows nothing serious.  I do not recommend any specific GI testing currently. Her chronic pains are multifactorial (obesity related, fibromyalgia related, on daily narcotics, has large HH).  She should stay on once daily PPI. Given the large size of HH which is felt to possibly be causing resp compromise, this should continue to be considered for repair when that is feasible.    Please call or page with any further questions or concerns.    Owens Loffler, MD [facility] Pager [phone]

## 2014-12-07 NOTE — Evaluation (Signed)
Occupational Therapy Evaluation Patient Details Name: Alexandria [name] MRN: [id number] DOB: 1951-02-12 Today's Date: 12/07/2014    History of Present Illness Patient is a 64 yo female admitted 12/04/14 with HCAP, hypoxemia, tachycardia.  PMH:  LT lymphadema, HTN, HLD, CHF, breast CA, anxiety, depression, DDD, CKD, peripheral neurogenic pain, fibromyalgia   Clinical Impression   PTA pt lived at home with her husband and required assistance for LB ADLs. She ambulates with a SPC at baseline. Pt is limited by severe back pain, however was ambulating hallways prior to OT session. Pt would benefit from [facility] for environmental modifications, home safety, and AE training. No further acute OT needs.     Follow Up Recommendations  Home health OT;Other (comment) (HHaide to assist with ADLS and homecare tasks)    Equipment Recommendations  Tub/shower bench    Recommendations for Other Services       Precautions / Restrictions Precautions Precautions: Fall Restrictions Weight Bearing Restrictions: No      Mobility Bed Mobility               General bed mobility comments: pt sitting on EOB when OT arrived. She reports she has an elevating sleep number bed to assist with bed mobility  Transfers Overall transfer level: Modified independent Equipment used: Straight cane                  Balance Overall balance assessment: Needs assistance Sitting-balance support: No upper extremity supported;Feet supported Sitting balance-Leahy Scale: Good     Standing balance support: Single extremity supported;During functional activity Standing balance-Leahy Scale: Fair                              ADL Overall ADL's : Needs assistance/impaired Eating/Feeding: Independent;Sitting   Grooming: Supervision/safety;Standing   Upper Body Bathing: Set up;Sitting   Lower Body Bathing: Minimal assistance;Sit to/from stand   Upper Body Dressing : Set up;Sitting   Lower Body  Dressing: Moderate assistance;Sit to/from stand   Toilet Transfer: Supervision/safety;Ambulation [facility])   Toileting- Clothing Manipulation and Hygiene: Supervision/safety;Sit to/from stand       Functional mobility during ADLs: Supervision/safety;Cane General ADL Comments: Pt c/o pain in back which limits ability to reach LEs. Educated pt on back health including back precautions to assist with pain.      Vision Vision Assessment?: No apparent visual deficits          Pertinent Vitals/Pain Pain Assessment: 0-10 Pain Score: 9  Pain Location: back Pain Descriptors / Indicators: Aching Pain Intervention(s): Limited activity within patient's tolerance;Monitored during session;Repositioned;Other (comment) (provided hot pack when ready)     Hand Dominance Right   Extremity/Trunk Assessment Upper Extremity Assessment Upper Extremity Assessment: Overall WFL for tasks assessed   Lower Extremity Assessment Lower Extremity Assessment: Generalized weakness (BLE edema)   Cervical / Trunk Assessment Cervical / Trunk Assessment: Normal   Communication Communication Communication: No difficulties   Cognition Arousal/Alertness: Awake/alert Behavior During Therapy: WFL for tasks assessed/performed Overall Cognitive Status: Within Functional Limits for tasks assessed                                Home Living Family/patient expects to be discharged to:: Private residence Living Arrangements: Spouse/significant other Available Help at Discharge: Family;Available 24 hours/day Type of Home: Apartment Home Access: Level entry     Home Layout: One level     Bathroom  Shower/Tub: Tub/shower unit Shower/tub characteristics: Architectural technologist: Standard     Home Equipment: [employer] - single point   Additional Comments: Pt's husband unable to provide much physical assistance; he is currently looking for fulltime job      Prior Functioning/Environment Level of  Independence: Needs assistance  Gait / Transfers Assistance Needed: Uses cane for ambulation ADL's / Homemaking Assistance Needed: Assist for reaching feet to put on socks and shoes due to back pain        OT Diagnosis: Generalized weakness;Acute pain       End of Session Equipment Utilized During Treatment: Other (comment) [facility])  Activity Tolerance: Patient tolerated treatment well;Patient limited by pain Patient left: with call bell/phone within reach;Other (comment) (sitting EOB)   Time: 5521-7471 OT Time Calculation (min): 20 min Charges:  OT General Charges $OT Visit: 1 Procedure OT Evaluation $Initial OT Evaluation Tier I: 1 Procedure G-Codes:    Juluis Rainier 12/08/14, 8:56 AM  Cyndie Chime, OTR/L Occupational Therapist [phone] (pager)

## 2014-12-07 NOTE — Progress Notes (Signed)
PT Cancellation Note  Patient Details Name: Alexandria [name] MRN: [id number] DOB: 02/10/1951   Cancelled Treatment:    Reason Eval/Treat Not Completed: Fatigue/lethargy limiting ability to participate. Noted that pt has been ambulating in the halls this morning (PT witnessed pt ambulating with Mod I on 3 different occasions prior to attempt at session). Pt states she "over-did it", and does not feel she can participate in session at this time. Will continue to follow and check back as schedule allows.   Rolinda Roan 12/07/2014, 11:25 AM   Rolinda Roan, PT, DPT Acute Rehabilitation Services Pager: [phone]

## 2014-12-07 NOTE — Care Management Note (Addendum)
CARE MANAGEMENT NOTE 12/07/2014  Patient:  Alexandria [name], Alexandria [name]   Account Number:  [id number]  Date Initiated:  12/07/2014  Documentation initiated by:  Elizeth Weinrich  Subjective/Objective Assessment:   CM following for progression and d/c planning.     Action/Plan:   Met with pt re d/c plans, pt requesting HHPT and HHOT. Will follow for PT/OT recommendations.   Anticipated DC Date:  12/09/2014   Anticipated DC Plan:  [place]  12/08/14 Noted order for shower chair, call placed pt [facility] portable equip to notify them that the pt will d/c this pm. CRoyal RN MPH         Choice offered to / List presented to:             Status of service:   Medicare Important Message given?  YES (If response is "NO", the following Medicare IM given date fields will be blank) Date Medicare IM given:  12/07/2014 Medicare IM given by:  Jovanne Riggenbach Date Additional Medicare IM given:   Additional Medicare IM given by:    Discharge Disposition:    Per UR Regulation:    If discussed at Long Length of Stay Meetings, dates discussed:    Comments:

## 2014-12-08 LAB — BASIC METABOLIC PANEL
ANION GAP: 10 (ref 5–15)
BUN: 9 mg/dL (ref 6–23)
CALCIUM: 8.8 mg/dL (ref 8.4–10.5)
CO2: 27 mmol/L (ref 19–32)
Chloride: 100 mmol/L (ref 96–112)
Creatinine, Ser: 0.91 mg/dL (ref 0.50–1.10)
GFR calc Af Amer: 77 mL/min — ABNORMAL LOW (ref >90)
GFR calc non Af Amer: 67 mL/min — ABNORMAL LOW (ref >90)
Glucose, Bld: 155 mg/dL — ABNORMAL HIGH (ref 70–99)
Potassium: 3.2 mmol/L — ABNORMAL LOW (ref 3.5–5.1)
Sodium: 137 mmol/L (ref 135–145)

## 2014-12-08 LAB — RESPIRATORY VIRUS PANEL
Adenovirus: NEGATIVE
Influenza A: NEGATIVE
Influenza B: NEGATIVE
Metapneumovirus: NEGATIVE
Parainfluenza 1: NEGATIVE
Parainfluenza 2: NEGATIVE
Parainfluenza 3: NEGATIVE
Respiratory Syncytial Virus A: NEGATIVE
Respiratory Syncytial Virus B: NEGATIVE
Rhinovirus: NEGATIVE

## 2014-12-08 MED ORDER — HYDROMORPHONE HCL 4 MG PO TABS: 4.0000 mg | ORAL_TABLET | ORAL | Status: DC | PRN

## 2014-12-08 MED ORDER — LEVOFLOXACIN 750 MG PO TABS: 750.0000 mg | ORAL_TABLET | Freq: Every day | ORAL | Status: DC

## 2014-12-08 MED ORDER — POTASSIUM CHLORIDE CRYS ER 20 MEQ PO TBCR: 40.0000 meq | EXTENDED_RELEASE_TABLET | Freq: Two times a day (BID) | ORAL | Status: DC

## 2014-12-08 MED ORDER — CARVEDILOL 3.125 MG PO TABS: 3.1250 mg | ORAL_TABLET | Freq: Two times a day (BID) | ORAL | Status: DC

## 2014-12-08 MED ORDER — PANTOPRAZOLE SODIUM 40 MG PO TBEC: 40.0000 mg | DELAYED_RELEASE_TABLET | Freq: Every day | ORAL | Status: DC

## 2014-12-08 NOTE — Progress Notes (Signed)
IV removed by patient. Discharge instructions reviewed with patient. Understanding verbalized. Awaiting husband for ride home.

## 2014-12-08 NOTE — Progress Notes (Signed)
PT Cancellation Note  Patient Details Name: Alexandria [name] MRN: [id number] DOB: [birth year]   Cancelled Treatment:    Reason Eval/Treat Not Completed: Fatigue/lethargy limiting ability to participate. Pt very lethargic and states she is "miserable with pain". Offered change in position and pt was educated that mobility and sitting up in chair may relieve some of her back pain. Pt declined. Will check back as schedule allows.    Rolinda Roan 12/08/2014, 8:44 AM   Rolinda Roan, PT, DPT Acute Rehabilitation Services Pager: [phone]

## 2014-12-08 NOTE — Discharge Summary (Signed)
Physician Discharge Summary  Alexandria [name] QMV:[id number] DOB: 06-28-1951 DOA: 12/04/2014  PCP: Alexandria Cowden, MD  Admit date: 12/04/2014 Discharge date: 12/08/2014  Time spent: 35 minutes  Recommendations for Outpatient Follow-up:  1. Needs to follow up with surgeon for further evaluation treatment of hiatal hernia.  2. Needs to follow up with IR for Port removal.  3. Needs TSH in 2 to 4 weeks.   Discharge Diagnoses:    HCAP (healthcare-associated pneumonia)   Chronic back pain   Morbid obesity   Anxiety state   Essential hypertension   Breast cancer   Chronic bronchitis   Acute on chronic kidney failure   Discharge Condition: Stable.   Diet recommendation: Heart Healthy  Filed Weights   12/04/14 1908 12/06/14 2100  Weight: 108.41 kg (239 lb) 109.181 kg (240 lb 11.2 oz)    History of present illness:   HPI: Alexandria [name] is a 64 y.o. female with past medical history of hypertension, hyperlipidemia, diastolic congestive heart failure, history of breast cancer 2014 (post status of radiation therapy and lumpectomy), anxiety disorder, chronic bronchitis on home oxygen 2 L, DDD and CKD-II, who presents with fever, productive cough and shortness breath.  Patient has history of chronic bronchitis and recurrent pneumonia. She states that she had 4 pneumonia in the past half year. In the past week, she has fever and worsening cough again. She had temperature 101.8 at one point at home. She coughs up some greenish colored sputum. She has shortness of breath, but no chest pain. She has nausea, but no vomiting, abdominal pain or diarrhea. He does not have sick contact. Patient denies abdominal pain, diarrhea, dysuria, urgency, frequency, hematuria, skin rashes. No unilateral weakness, numbness or tingling sensations. No vision change or hearing loss.  In ED, patient was found to have WBC 5.2, temperature 99.9, tachycardia, AoCKD-II. X-ray of abd/chest showed bibasilar airspace  opacities may reflect atelectasis, aspiration, and/or pneumonia per radiologist, and moderate to large hiatal hernia.  Hospital Course:  HCAP: Patient's fever, productive cough and shortness of breath is likely caused by PNA. The etiology might be related to hiatal hernia. - IV Vancomycin and cefepime for 5 days. Discharge on levaquin for 5 days.  - Urine legionella negtave and S. pneumococcal antigen negative.  - Atrovent q4h (she is allergic to Albuterol). - Follow up blood culture x2 no growth to date, influenza negative.  - Zofran for Nausea - CT-chest without contrast; infiltrates bases.  -refuse to use oxygen at times.  -HIV negative  Tachycardia; she relates prior history of tachycardia.  Started  low dose coreg. HR better.   Abdominal pain; had BM . Pain on palpation. CT abdomen and pelvis only showed hiatal hernia. Alexandria [name] recommend follow up with primary surgeon for evaluation of hiatal hernia.   Right LE cellulitis; continue with antibiotics.   Low TSH; Free T 3 and Free T4 normal. Need repeat TSH in 4 weeks.   Diastolic CHF: 2-D echo on 09/09/14 showed EF of 55-60% with grade 1 diastolic dysfunction. Patient is on Lasix 80 mg daily at home. Congestive heart failure is compensated on admission. -Lasix ON due to AoCKD-II -BNP at 25.  -nsl FLUIDS.  -continue with  lasix home dose.   AoCKD-II: Creatinine increased from previous 1.09 to 1.42. BUN 31. Likely due to prerenal -renal ultrasound no acute finding.  -hold Lasix and lisinopril NSL fluids. Resume lasix.   Hypertension: -hold lisinopril and norvasc.  -Continue to hold BP medication in setting of  infection.   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 She is s/p of surgery and radiation therapy. She is followed by Alexandria. Orlene [name]. Currently she is taking anastrozole. -will follow up with oncologist  outpatient.  -continue anastrozole  Port-A-line: Patient had a port A-line placement. No signs for infection, but CXR showed tip projecting over the proximal SVC, cannot exclude extension to the azygos vein. -Per IR port in right position.  -Per patient she was suppose to have port removed.  Needs to follow up outpatient for port removal.   Procedures: Renal US; No acute finding or hydronephrosis. Limited assessment of the left  kidney.  Consultations:  GI  Discharge Exam: Filed Vitals:   12/08/14 0909  BP: 113/65  Pulse: 76  Temp: 98.4 F (36.9 C)  Resp: 17    General: Alert in no distress.  Cardiovascular: S 1, S 2 RRR Respiratory: CTA  Discharge Instructions    Current Discharge Medication List    START taking these medications   Details  carvedilol (COREG) 3.125 MG tablet Take 1 tablet (3.125 mg total) by mouth 2 (two) times daily with a meal. Qty: 60 tablet, Refills: 0    levofloxacin (LEVAQUIN) 750 MG tablet Take 1 tablet (750 mg total) by mouth daily. Qty: 5 tablet, Refills: 0    pantoprazole (PROTONIX) 40 MG tablet Take 1 tablet (40 mg total) by mouth daily. Qty: 30 tablet, Refills: 0      CONTINUE these medications which have CHANGED   Details  HYDROmorphone (DILAUDID) 4 MG tablet Take 1 tablet (4 mg total) by mouth every 4 (four) hours as needed for severe pain (Pt states she is allowed 5 a day). Qty: 24 tablet, Refills: 0      CONTINUE these medications which have NOT CHANGED   Details  anastrozole (ARIMIDEX) 1 MG tablet TAKE 1 TABLET BY MOUTH EVERY DAY Qty: 90 tablet, Refills: 0    atorvastatin (LIPITOR) 20 MG tablet Take 1 tablet (20 mg total) by mouth daily. Qty: 90 tablet, Refills: 3    clotrimazole (LOTRIMIN) 1 % cream Apply topically 2 (two) times daily. Qty: 30 g, Refills: 0    docusate sodium 100 MG CAPS Take 100 mg by mouth 2 (two) times daily. Qty: 10 capsule, Refills: 0    furosemide (LASIX) 80 MG tablet Take 1 tablet  (80 mg total) by mouth daily.    HYDROmorphone HCl 16 MG T24A Take 16 mg by mouth every 12 (twelve) hours.  Refills: 0    lidocaine (LIDODERM) 5 % Place 1-2 patches onto the skin daily. Remove & Discard patch within 12 hours or as directed by MD    lidocaine-prilocaine (EMLA) cream Apply 1 application topically as needed (port access). Qty: 30 g, Refills: 3.  I am [facility]    LORazepam (ATIVAN) 0.5 MG tablet Take 1 tablet (0.5 mg total) by mouth every 8 (eight) hours as needed for anxiety. Qty: 90 tablet, Refills: 1    lubiprostone (AMITIZA) 24 MCG capsule Take 1 capsule (24 mcg total) by mouth 2 (two) times daily with a meal. Qty: 60 capsule, Refills: 0    Multiple Vitamins-Minerals (HAIR/SKIN/NAILS PO) Take 1 tablet by mouth every morning.    potassium chloride (KLOR-CON) 20 MEQ packet Take 40 mEq by mouth daily.      STOP taking these medications     HYDROcodone-homatropine (HYCODAN) 5-1.5 MG/5ML syrup  lisinopril (PRINIVIL,ZESTRIL) 5 MG tablet        Allergies  Allergen Reactions  . Contrast Media [Iodinated Diagnostic Agents] Anaphylaxis  . Albuterol Other (See Comments)    Anxious   . Prednisone Other (See Comments)    Pt gets very agitated when she takes high doses of steroids   Follow-up Information    Follow up with Alexandria Cowden, MD.   Specialty:  Internal Medicine   Contact information:   [place] [address] [phone]        The results of significant diagnostics from this hospitalization (including imaging, microbiology, ancillary and laboratory) are listed below for reference.    Significant Diagnostic Studies: Ct Abdomen Pelvis Wo Contrast  12/06/2014   CLINICAL DATA:  Patient complains of abdominal distention and pain with some nausea and vomiting. States dx'd with breast cancer in 2014 and was treated with surgery, chemo, and radiation. Had an incarcerated abdominal hernia with subsequent surgery.  EXAM: CT  ABDOMEN AND PELVIS WITHOUT CONTRAST  TECHNIQUE: Multidetector CT imaging of the abdomen and pelvis was performed following the standard protocol without IV contrast.  COMPARISON:  10/01/2014  FINDINGS: Patchy, linear and reticular opacities at the lung bases is likely due to atelectasis, most evident adjacent to a moderate hiatal hernia which bulges against the left medial lower lobe. Heart is normal in size.  Fatty infiltration of the liver.  No liver mass or focal lesion.  Spleen, gallbladder, pancreas, adrenal glands:  Normal.  No renal masses or stones. No hydronephrosis. Normal ureters. Bladder is minimally distended but otherwise unremarkable.  Uterus is surgically absent.  No pelvic masses.  No pathologically enlarged lymph nodes.  No ascites.  There is scarring along the anterior mid to lower abdominal wall midline.  No colonic or small bowel distention. No evidence of obstruction or significant adynamic ileus. No bowel wall thickening or mesenteric inflammation.  Degenerative changes are noted of the visualized spine. Grade 1 to grade 2 anterolisthesis of L5 on S1. No osteoblastic or osteolytic lesions.  IMPRESSION: 1. No acute findings within the abdomen or pelvis. 2. Colon and small-bowel normal caliber with no evidence of obstruction or significant adynamic ileus. 3. Fatty infiltration of the liver and 4. Moderate hiatal hernia.  Areas of lung base atelectasis. 5. Degenerative changes of the visualized spine with a grade 1 to grade 2 anterolisthesis of L5 on S1, degenerative in origin.   Electronically Signed   By: Lajean Manes M.D.   On: 12/06/2014 14:19   Ct Chest Wo Contrast  12/05/2014   CLINICAL DATA:  Recurrent pneumonia.  Fever and worsening cough.  EXAM: CT CHEST WITHOUT CONTRAST  TECHNIQUE: Multidetector CT imaging of the chest was performed following the standard protocol without IV contrast.  COMPARISON:  Radiographs 12/04/2014, CT 07/10/2014  FINDINGS: Minimal basilar patchy and  curvilinear opacities are present bilaterally, right greater than left. This is more likely atelectasis and scarring but infection could be present, particularly in the right base were there is somewhat more confluence. There are no effusions. There is a large hiatal hernia with most of the stomach located in the chest. There is no mediastinal or hilar adenopathy. There is a satisfactorily positioned left subclavian central line with tip in the SVC. Upper abdomen is remarkable only for the large hiatal hernia.  IMPRESSION: Minimal basilar opacities bilaterally, more likely atelectatic but a small focus of infectious infiltrate cannot be excluded in the right posterior base. Large hiatal hernia.  Electronically Signed   By: Andreas Newport M.D.   On: 12/05/2014 04:51   US Renal  12/05/2014   CLINICAL DATA:  Acute renal injury, hypertension, obesity  EXAM: RENAL/URINARY TRACT ULTRASOUND COMPLETE  COMPARISON:  10/01/2004  FINDINGS: Right Kidney:  Length: 10.2 cm. Echogenicity within normal limits. No mass or hydronephrosis visualized.  Left Kidney:  Length: 10.1 cm. Limited visualization because of obscuring bowel gas and high position beneath the left posterior ribs. Echogenicity within normal limits. No mass or hydronephrosis visualized.  Bladder:  Appears normal for degree of bladder distention.  Included views of the gallbladder demonstrate a small hypoechoic non shadowing focus along the gallbladder wall measuring 5 mm compatible with a polyp.  IMPRESSION: No acute finding or hydronephrosis. Limited assessment of the left kidney.   Electronically Signed   By: Jerilynn Mages.  Shick M.D.   On: 12/05/2014 09:45   Dg Abd Acute W/chest  12/04/2014   CLINICAL DATA:  Nausea, vomiting.  Abdominal pain.  Fever.  EXAM: ACUTE ABDOMEN SERIES (ABDOMEN 2 VIEW & CHEST 1 VIEW)  COMPARISON:  10/01/2014  FINDINGS: Bibasilar opacities. Surgical clips project over the right breast. Cardiomediastinal contours within normal range. Left  chest wall Port-A-Cath with thin tube, tip projecting over the proximal SVC however cannot exclude extension to the azygos vein. Moderate to large hiatal hernia. The bowel gas pattern nonspecific, nonobstructive. No free intraperitoneal air identified. Rightward curvature of the lumbar spine. Large stool burden.  IMPRESSION: Bibasilar airspace opacities may reflect atelectasis, aspiration, and/or pneumonia. Recommend radiographic follow-up in 2-3 weeks to document resolution.  Moderate to large hiatal hernia.  Large stool burden.  No bowel obstruction.  Left chest wall Port-A-Cath with thin tube, tip projecting over the proximal SVC however cannot exclude extension to the azygos vein.   Electronically Signed   By: Carlos Levering M.D.   On: 12/04/2014 22:00    Microbiology: Recent Results (from the past 240 hour(s))  Culture, blood (routine x 2)     Status: None (Preliminary result)   Collection Time: 12/04/14 10:36 PM  Result Value Ref Range Status   Specimen Description BLOOD HAND LEFT  Final   Special Requests BOTTLES DRAWN AEROBIC AND ANAEROBIC 5CC  Final   Culture   Final           BLOOD CULTURE RECEIVED NO GROWTH TO DATE CULTURE WILL BE HELD FOR 5 DAYS BEFORE ISSUING A FINAL NEGATIVE REPORT Performed at [facility]    Report Status PENDING  Incomplete  Culture, blood (routine x 2)     Status: None (Preliminary result)   Collection Time: 12/04/14 10:41 PM  Result Value Ref Range Status   Specimen Description BLOOD WRIST LEFT  Final   Special Requests BOTTLES DRAWN AEROBIC AND ANAEROBIC 5CC  Final   Culture   Final           BLOOD CULTURE RECEIVED NO GROWTH TO DATE CULTURE WILL BE HELD FOR 5 DAYS BEFORE ISSUING A FINAL NEGATIVE REPORT Performed at [facility]    Report Status PENDING  Incomplete  MRSA PCR Screening     Status: None   Collection Time: 12/05/14  4:52 AM  Result Value Ref Range Status   MRSA by PCR NEGATIVE NEGATIVE Final    Comment:        The  GeneXpert MRSA Assay (FDA approved for NASAL specimens only), is one component of a comprehensive MRSA colonization surveillance program. It is not intended to diagnose MRSA infection nor  to guide or monitor treatment for MRSA infections.   Respiratory virus panel     Status: None   Collection Time: 12/05/14  7:17 AM  Result Value Ref Range Status   Source - RVPAN NASAL SWAB  Corrected   Respiratory Syncytial Virus A Negative Negative Final   Respiratory Syncytial Virus B Negative Negative Final   Influenza A Negative Negative Final   Influenza B Negative Negative Final   Parainfluenza 1 Negative Negative Final   Parainfluenza 2 Negative Negative Final   Parainfluenza 3 Negative Negative Final   Metapneumovirus Negative Negative Final   Rhinovirus Negative Negative Final   Adenovirus Negative Negative Final    Comment: (NOTE) Performed At: [facility] [place], [place] [id number] Lindon Romp MD WK:[id number]      Labs: Basic Metabolic Panel:  Recent Labs Lab 12/04/14 1815 12/05/14 0917 12/06/14 0450 12/07/14 0719 12/08/14 0520  NA 133* 133* 140 138 137  K 3.7 3.5 3.5 3.2* 3.2*  CL 97 100 105 104 100  CO2 _0 GLUCOSE 145* 119* 110* 99 155*  BUN 31* _1 CREATININE 1.42* 0.99 0.82 0.87 0.91  CALCIUM 9.1 8.7 8.9 8.9 8.8   Liver Function Tests:  Recent Labs Lab 12/04/14 1815  AST 30  ALT 31  ALKPHOS 159*  BILITOT 0.6  PROT 7.0  ALBUMIN 3.6    Recent Labs Lab 12/04/14 1815  LIPASE 26   No results for input(s): AMMONIA in the last 168 hours. CBC:  Recent Labs Lab 12/04/14 1815 12/05/14 0917 12/06/14 0450 12/07/14 0719  WBC 5.2 4.0 3.7* 3.3*  NEUTROABS 3.2  --   --   --   HGB 14.3 12.8 13.2 13.0  HCT 42.7 38.1 40.2 38.8  MCV 85.6 87.4 88.7 87.2  PLT 195 145* 153 138*   Cardiac Enzymes:  Recent Labs Lab 12/05/14 0314 12/05/14 0917 12/05/14 1545  TROPONINI <0.03 <0.03 <0.03   BNP: BNP  (last 3 results)  Recent Labs  10/01/14 1927 12/05/14 0315  BNP 42.8 25.5    ProBNP (last 3 results)  Recent Labs  09/07/14 2245  PROBNP 304.7*    CBG: No results for input(s): GLUCAP in the last 168 hours.     SignedNiel Hummer A  [facility] 12/08/2014, 11:32 AM

## 2014-12-09 DIAGNOSIS — M179 Osteoarthritis of knee, unspecified: Secondary | ICD-10-CM | POA: Diagnosis not present

## 2014-12-09 DIAGNOSIS — M25569 Pain in unspecified knee: Secondary | ICD-10-CM | POA: Diagnosis not present

## 2014-12-10 ENCOUNTER — Other Ambulatory Visit: Payer: Self-pay

## 2014-12-10 ENCOUNTER — Telehealth: Payer: Self-pay | Admitting: Hematology and Oncology

## 2014-12-10 ENCOUNTER — Telehealth: Payer: Self-pay | Admitting: Internal Medicine

## 2014-12-10 ENCOUNTER — Ambulatory Visit: Payer: Self-pay | Admitting: Hematology and Oncology

## 2014-12-10 NOTE — Telephone Encounter (Signed)
Rx called in to pharmacy. 

## 2014-12-10 NOTE — Telephone Encounter (Signed)
Pt needs her Lorazepam refilled. Has just gotten out of the hospital for the 4th time and wanted you to know. Has an appt in May and wanted to keep that one. I offered to sch her for a hosp follow up but she declined.

## 2014-12-10 NOTE — Telephone Encounter (Signed)
patient called to resched appt ....done...patient aware of new d.t...she stated thta she just got out of the hospital and wanted to sched a month or so out.

## 2014-12-10 NOTE — Telephone Encounter (Signed)
Pt notified Rx called into pharmacy 

## 2014-12-11 LAB — CULTURE, BLOOD (ROUTINE X 2)
CULTURE: NO GROWTH
Culture: NO GROWTH

## 2014-12-14 ENCOUNTER — Other Ambulatory Visit: Payer: Self-pay | Admitting: Family Medicine

## 2014-12-14 ENCOUNTER — Telehealth: Payer: Self-pay | Admitting: Family Medicine

## 2014-12-14 NOTE — Telephone Encounter (Signed)
Refill request for Amitiza 24 mcg and send to [facility] [phone].

## 2014-12-15 ENCOUNTER — Telehealth: Payer: Self-pay | Admitting: *Deleted

## 2014-12-15 DIAGNOSIS — G894 Chronic pain syndrome: Secondary | ICD-10-CM | POA: Diagnosis not present

## 2014-12-15 DIAGNOSIS — C50419 Malignant neoplasm of upper-outer quadrant of unspecified female breast: Secondary | ICD-10-CM

## 2014-12-15 DIAGNOSIS — Z79899 Other long term (current) drug therapy: Secondary | ICD-10-CM | POA: Diagnosis not present

## 2014-12-15 DIAGNOSIS — M549 Dorsalgia, unspecified: Secondary | ICD-10-CM | POA: Diagnosis not present

## 2014-12-15 MED ORDER — LUBIPROSTONE 24 MCG PO CAPS: 24.0000 ug | ORAL_CAPSULE | Freq: Two times a day (BID) | ORAL | Status: DC

## 2014-12-15 NOTE — Telephone Encounter (Signed)
Spoke to pt, told her received request for refill on Amitiza, which pharmacy do you want it sent to? Pt said to [facility]. Told pt okay will send Rx. Pt verbalized understanding.

## 2014-12-15 NOTE — Telephone Encounter (Signed)
Dr.K, okay to fill Rx for Amitiza 24 mcg?

## 2014-12-15 NOTE — Telephone Encounter (Signed)
ok 

## 2014-12-15 NOTE — Telephone Encounter (Signed)
Pt requests PAC removed.  She says hospital staff have not been able to use it, they have told it it has "slipped down."  She had it placed by a Surgeon in [place] a few years ago and since it has not been functioning right she just wants it removed and asks for it to be removed by IR.  Informed pt ok w/ Dr. Alvy Bimler and order placed for [facility] removal,  Expect a call from IR to schedule removal. She verbalized understanding.

## 2014-12-17 ENCOUNTER — Telehealth: Payer: Self-pay | Admitting: Internal Medicine

## 2014-12-17 NOTE — Telephone Encounter (Signed)
Pt states [employer] requires prior lubiprostone (AMITIZA) 24 MCG capsule Pt has been on this med 8 yrs. Pt wants to speak w/ you first before you contact insurance company for some reason  [facility] [address]

## 2014-12-17 NOTE — Telephone Encounter (Signed)
PA has been submitted with OTC medications provided to insurance.

## 2014-12-18 MED ORDER — LINACLOTIDE 290 MCG PO CAPS: 290.0000 ug | ORAL_CAPSULE | Freq: Every day | ORAL | Status: DC

## 2014-12-18 NOTE — Telephone Encounter (Signed)
Spoke to pt, told her [employer] has denied Amitiza, must try and fail Linzess. Told pt try for a month if it does not work we can try to do PA again. Pt verbalized understanding. Rx for Linzess 290 mcg one tablet daily sent to pharmacy. Pt verbalized understanding.

## 2014-12-18 NOTE — Telephone Encounter (Signed)
PA for Amitiza was denied.  Patient need to try and fail Linzess.

## 2014-12-18 NOTE — Telephone Encounter (Signed)
Please see message and advise dosage for Linzess.

## 2014-12-18 NOTE — Telephone Encounter (Signed)
Linzess 290  #90  One daily

## 2015-01-01 ENCOUNTER — Other Ambulatory Visit: Payer: Self-pay | Admitting: Internal Medicine

## 2015-01-01 ENCOUNTER — Telehealth: Payer: Self-pay | Admitting: Internal Medicine

## 2015-01-01 ENCOUNTER — Other Ambulatory Visit: Payer: Self-pay | Admitting: *Deleted

## 2015-01-01 MED ORDER — PANTOPRAZOLE SODIUM 40 MG PO TBEC: 40.0000 mg | DELAYED_RELEASE_TABLET | Freq: Every day | ORAL | Status: DC

## 2015-01-01 MED ORDER — ATORVASTATIN CALCIUM 20 MG PO TABS: 20.0000 mg | ORAL_TABLET | Freq: Every day | ORAL | Status: DC

## 2015-01-01 MED ORDER — CARVEDILOL 3.125 MG PO TABS: 3.1250 mg | ORAL_TABLET | Freq: Two times a day (BID) | ORAL | Status: DC

## 2015-01-01 MED ORDER — LINACLOTIDE 290 MCG PO CAPS: 290.0000 ug | ORAL_CAPSULE | Freq: Every day | ORAL | Status: DC

## 2015-01-01 MED ORDER — FUROSEMIDE 80 MG PO TABS: 80.0000 mg | ORAL_TABLET | Freq: Every day | ORAL | Status: DC

## 2015-01-01 MED ORDER — POTASSIUM CHLORIDE 20 MEQ PO PACK: 40.0000 meq | PACK | Freq: Every day | ORAL | Status: DC

## 2015-01-01 MED ORDER — LIDOCAINE-PRILOCAINE 2.5-2.5 % EX CREA: 1.0000 "application " | TOPICAL_CREAM | CUTANEOUS | Status: DC | PRN

## 2015-01-01 NOTE — Telephone Encounter (Signed)
Pt said all her  meds should be rx for 90 days or they will not pay

## 2015-01-01 NOTE — Telephone Encounter (Signed)
Left message on voicemail to call office.  

## 2015-01-01 NOTE — Telephone Encounter (Signed)
Pt called back told her I can not fill Anastrozole which is prescribed by Oncology or Lisinopril prescribed by Cardiology. She will have to contact them. Pt verbalized understanding.Told pt I will send Rx's to [facility] as requested 90 day supply. Pt verbalized understanding. Rx's sent.

## 2015-01-01 NOTE — Telephone Encounter (Signed)
CALLED PATIENT for clarification of voicemail request as she did not know the names of her medications needed.  Reports "[facility] will not allow use of retail pharmacy without a high cost.  One medicine is an anti-depressant we're using for something else."   Noted a faxed request for two of the three.  Noted Dr. Cordelia Pen has refilled Emla cream and the fax request is for anastrozole (last filled 12-02-2014 for qty of ninety), and escitalopram that was found as an outside reconciliation.  Patient reports Dr. Alvy Bimler prescribed Lexapro.  Request to provider for review.  Next scheduled F/u 01-19-2015.

## 2015-01-04 ENCOUNTER — Other Ambulatory Visit: Payer: Self-pay | Admitting: *Deleted

## 2015-01-04 MED ORDER — ANASTROZOLE 1 MG PO TABS: 1.0000 mg | ORAL_TABLET | Freq: Every day | ORAL | Status: DC

## 2015-01-04 MED ORDER — ESCITALOPRAM OXALATE 20 MG PO TABS: 20.0000 mg | ORAL_TABLET | Freq: Every day | ORAL | Status: DC

## 2015-01-05 DIAGNOSIS — I872 Venous insufficiency (chronic) (peripheral): Secondary | ICD-10-CM | POA: Diagnosis not present

## 2015-01-05 DIAGNOSIS — B353 Tinea pedis: Secondary | ICD-10-CM | POA: Diagnosis not present

## 2015-01-05 DIAGNOSIS — L821 Other seborrheic keratosis: Secondary | ICD-10-CM | POA: Diagnosis not present

## 2015-01-05 DIAGNOSIS — L309 Dermatitis, unspecified: Secondary | ICD-10-CM | POA: Diagnosis not present

## 2015-01-05 DIAGNOSIS — L853 Xerosis cutis: Secondary | ICD-10-CM | POA: Diagnosis not present

## 2015-01-08 ENCOUNTER — Telehealth: Payer: Self-pay | Admitting: *Deleted

## 2015-01-08 ENCOUNTER — Other Ambulatory Visit: Payer: Self-pay | Admitting: *Deleted

## 2015-01-08 NOTE — Telephone Encounter (Signed)
TC from patient regarding refills for Lexapro and Anastrozole. She had called [facility] and they told her they had not received them. According to chart they were fax'd on 01/04/15. Discused with Cameo, RN  And she will re-fax.  Call made to pt and advised her of the above.   Patient verbalized understanding.

## 2015-01-08 NOTE — Telephone Encounter (Signed)
I re faxed the signed Rx for Lexapro and Anastrozole to [facility] at fax #[phone] and received confirmation fax was accepted.

## 2015-01-11 DIAGNOSIS — M792 Neuralgia and neuritis, unspecified: Secondary | ICD-10-CM | POA: Diagnosis not present

## 2015-01-11 DIAGNOSIS — M79606 Pain in leg, unspecified: Secondary | ICD-10-CM | POA: Diagnosis not present

## 2015-01-11 DIAGNOSIS — M545 Low back pain: Secondary | ICD-10-CM | POA: Diagnosis not present

## 2015-01-11 DIAGNOSIS — G894 Chronic pain syndrome: Secondary | ICD-10-CM | POA: Diagnosis not present

## 2015-01-11 DIAGNOSIS — M179 Osteoarthritis of knee, unspecified: Secondary | ICD-10-CM | POA: Diagnosis not present

## 2015-01-14 ENCOUNTER — Telehealth: Payer: Self-pay | Admitting: Pulmonary Disease

## 2015-01-14 DIAGNOSIS — R0902 Hypoxemia: Secondary | ICD-10-CM

## 2015-01-14 NOTE — Telephone Encounter (Signed)
Pt would like to have [facility] pick up oxygen supplies from her home.  She states she hasnt used it in over 3 months because she is doing so well.  Please advise if ok to d/c oxygen?

## 2015-01-14 NOTE — Telephone Encounter (Signed)
Ok with me as long as she understands that her levels of oxygen may be low without her knowing it.  We are happy to check the levels before she discontinues.

## 2015-01-14 NOTE — Telephone Encounter (Signed)
Spoke with pt regarding Dr Janifer Adie recommendations.  Pt states that she has a pulse ox at home and her levels are good and she can have her husband check it during the night.  Pt would still like to go forward with having oxygen picked up.  Order sent to [facility]

## 2015-01-18 ENCOUNTER — Other Ambulatory Visit: Payer: Self-pay | Admitting: Hematology and Oncology

## 2015-01-18 DIAGNOSIS — Z1231 Encounter for screening mammogram for malignant neoplasm of breast: Secondary | ICD-10-CM

## 2015-01-19 ENCOUNTER — Telehealth: Payer: Self-pay | Admitting: *Deleted

## 2015-01-19 ENCOUNTER — Other Ambulatory Visit: Payer: Self-pay

## 2015-01-19 ENCOUNTER — Ambulatory Visit: Payer: Self-pay | Admitting: Hematology and Oncology

## 2015-01-19 NOTE — Telephone Encounter (Signed)
She has an appt already set for July. I will cancel her appt today Please make sure she completes her mammogram before she sees me in July

## 2015-01-19 NOTE — Telephone Encounter (Signed)
PT. CALLED. SHE HAS THE "STOMACH FLU" WITH DIARRHEA. PT. WOULD LIKE TO RESCHEDULE HER APPOINTMENT WITH [place] TO NEXT WEEK.

## 2015-01-19 NOTE — Telephone Encounter (Signed)
LEFT MESSAGE ON PT.'S VOICE MAIL WITH DR.GORSUCH'S INSTRUCTIONS. CANCEL TODAY'S APPOINTMENT. PT. HAS AN APPOINTMENT SCHEDULED IN July. PT. TO COMPLETE HER MAMMOGRAM BEFORE HER VISIT WITH [place] IN July. REQUESTED THAT PT. RETURNS A CALL TO [facility] SHE RECEIVED INSTRUCTIONS.

## 2015-01-21 ENCOUNTER — Ambulatory Visit
Admission: RE | Admit: 2015-01-21 | Discharge: 2015-01-21 | Disposition: A | Discharge status: Home / Self Care | Payer: 59 | Source: Ambulatory Visit | Attending: Hematology and Oncology | Admitting: Hematology and Oncology

## 2015-01-21 ENCOUNTER — Other Ambulatory Visit: Payer: Self-pay | Admitting: Hematology and Oncology

## 2015-01-21 DIAGNOSIS — Z853 Personal history of malignant neoplasm of breast: Secondary | ICD-10-CM

## 2015-01-21 DIAGNOSIS — R928 Other abnormal and inconclusive findings on diagnostic imaging of breast: Secondary | ICD-10-CM | POA: Diagnosis not present

## 2015-02-02 ENCOUNTER — Telehealth: Payer: Self-pay | Admitting: Internal Medicine

## 2015-02-02 NOTE — Telephone Encounter (Signed)
Spoke to pt, told her it would be best to wait till she comes in because she had las done in March and then he can order what you need. Pt verbalized understanding.

## 2015-02-02 NOTE — Telephone Encounter (Signed)
Pt called to ask if she can have some labs done before she come in next week. She is asking for a back

## 2015-02-04 ENCOUNTER — Other Ambulatory Visit: Payer: Self-pay | Admitting: Internal Medicine

## 2015-02-04 ENCOUNTER — Ambulatory Visit: Payer: Self-pay | Admitting: Internal Medicine

## 2015-02-08 ENCOUNTER — Other Ambulatory Visit (HOSPITAL_COMMUNITY): Payer: Self-pay

## 2015-02-08 ENCOUNTER — Ambulatory Visit (HOSPITAL_COMMUNITY): Payer: Medicare Other

## 2015-02-10 DIAGNOSIS — Z79899 Other long term (current) drug therapy: Secondary | ICD-10-CM | POA: Diagnosis not present

## 2015-02-10 DIAGNOSIS — G894 Chronic pain syndrome: Secondary | ICD-10-CM | POA: Diagnosis not present

## 2015-02-10 DIAGNOSIS — G629 Polyneuropathy, unspecified: Secondary | ICD-10-CM | POA: Diagnosis not present

## 2015-02-10 DIAGNOSIS — M179 Osteoarthritis of knee, unspecified: Secondary | ICD-10-CM | POA: Diagnosis not present

## 2015-02-10 DIAGNOSIS — M545 Low back pain: Secondary | ICD-10-CM | POA: Diagnosis not present

## 2015-02-10 DIAGNOSIS — M25569 Pain in unspecified knee: Secondary | ICD-10-CM | POA: Diagnosis not present

## 2015-02-12 ENCOUNTER — Other Ambulatory Visit: Payer: Self-pay | Admitting: Radiology

## 2015-02-12 ENCOUNTER — Ambulatory Visit: Payer: Medicare Other | Admitting: Internal Medicine

## 2015-02-16 ENCOUNTER — Ambulatory Visit (HOSPITAL_COMMUNITY): Admission: RE | Admit: 2015-02-16 | Payer: Medicare Other | Source: Ambulatory Visit

## 2015-02-16 ENCOUNTER — Inpatient Hospital Stay (HOSPITAL_COMMUNITY): Admission: RE | Admit: 2015-02-16 | Payer: Self-pay | Source: Ambulatory Visit

## 2015-02-24 ENCOUNTER — Ambulatory Visit: Payer: Medicare Other | Admitting: Internal Medicine

## 2015-02-24 ENCOUNTER — Other Ambulatory Visit: Payer: Self-pay | Admitting: Internal Medicine

## 2015-03-08 ENCOUNTER — Other Ambulatory Visit: Payer: Self-pay | Admitting: Hematology and Oncology

## 2015-03-08 DIAGNOSIS — I872 Venous insufficiency (chronic) (peripheral): Secondary | ICD-10-CM | POA: Diagnosis not present

## 2015-03-08 DIAGNOSIS — B354 Tinea corporis: Secondary | ICD-10-CM | POA: Diagnosis not present

## 2015-03-08 DIAGNOSIS — B353 Tinea pedis: Secondary | ICD-10-CM | POA: Diagnosis not present

## 2015-03-10 DIAGNOSIS — G629 Polyneuropathy, unspecified: Secondary | ICD-10-CM | POA: Diagnosis not present

## 2015-03-10 DIAGNOSIS — M179 Osteoarthritis of knee, unspecified: Secondary | ICD-10-CM | POA: Diagnosis not present

## 2015-03-10 DIAGNOSIS — G894 Chronic pain syndrome: Secondary | ICD-10-CM | POA: Diagnosis not present

## 2015-03-10 DIAGNOSIS — M79606 Pain in leg, unspecified: Secondary | ICD-10-CM | POA: Diagnosis not present

## 2015-03-10 DIAGNOSIS — M545 Low back pain: Secondary | ICD-10-CM | POA: Diagnosis not present

## 2015-03-10 DIAGNOSIS — Z79899 Other long term (current) drug therapy: Secondary | ICD-10-CM | POA: Diagnosis not present

## 2015-03-23 ENCOUNTER — Telehealth: Payer: Self-pay | Admitting: Internal Medicine

## 2015-03-23 ENCOUNTER — Other Ambulatory Visit: Payer: Self-pay | Admitting: Internal Medicine

## 2015-03-23 MED ORDER — POTASSIUM CHLORIDE CRYS ER 20 MEQ PO TBCR: 20.0000 meq | EXTENDED_RELEASE_TABLET | Freq: Three times a day (TID) | ORAL | Status: DC

## 2015-03-23 NOTE — Telephone Encounter (Signed)
Spoke to pt, asked her how many tablets is she taking. Pt stated she takes one tablet three times a day. Told her okay will send Rx to pharmacy. Pt verbalized understanding. Rx sent.

## 2015-03-23 NOTE — Telephone Encounter (Signed)
Pt needs potassium chloride tablets not packet. [facility] [address]

## 2015-03-23 NOTE — Telephone Encounter (Signed)
Left message on voicemail to call office. Need to know how many Potassium tablets pt is taking.

## 2015-03-26 ENCOUNTER — Other Ambulatory Visit: Payer: Self-pay | Admitting: Hematology and Oncology

## 2015-03-26 ENCOUNTER — Other Ambulatory Visit (HOSPITAL_BASED_OUTPATIENT_CLINIC_OR_DEPARTMENT_OTHER): Payer: 59

## 2015-03-26 ENCOUNTER — Telehealth: Payer: Self-pay | Admitting: Hematology and Oncology

## 2015-03-26 ENCOUNTER — Ambulatory Visit (HOSPITAL_BASED_OUTPATIENT_CLINIC_OR_DEPARTMENT_OTHER): Payer: 59 | Admitting: Hematology and Oncology

## 2015-03-26 ENCOUNTER — Encounter: Payer: Self-pay | Admitting: Hematology and Oncology

## 2015-03-26 VITALS — BP 155/62 | HR 91 | Temp 98.1°F | Resp 18

## 2015-03-26 DIAGNOSIS — Z79810 Long term (current) use of selective estrogen receptor modulators (SERMs): Secondary | ICD-10-CM | POA: Diagnosis not present

## 2015-03-26 DIAGNOSIS — C50411 Malignant neoplasm of upper-outer quadrant of right female breast: Secondary | ICD-10-CM | POA: Diagnosis not present

## 2015-03-26 DIAGNOSIS — D509 Iron deficiency anemia, unspecified: Secondary | ICD-10-CM

## 2015-03-26 DIAGNOSIS — Z862 Personal history of diseases of the blood and blood-forming organs and certain disorders involving the immune mechanism: Secondary | ICD-10-CM

## 2015-03-26 DIAGNOSIS — E669 Obesity, unspecified: Secondary | ICD-10-CM

## 2015-03-26 DIAGNOSIS — C50911 Malignant neoplasm of unspecified site of right female breast: Secondary | ICD-10-CM

## 2015-03-26 DIAGNOSIS — Z853 Personal history of malignant neoplasm of breast: Secondary | ICD-10-CM | POA: Diagnosis not present

## 2015-03-26 DIAGNOSIS — D500 Iron deficiency anemia secondary to blood loss (chronic): Secondary | ICD-10-CM

## 2015-03-26 LAB — BASIC METABOLIC PANEL (CC13)
Anion Gap: 8 mEq/L (ref 3–11)
BUN: 21.1 mg/dL (ref 7.0–26.0)
CO2: 29 mEq/L (ref 22–29)
CREATININE: 1 mg/dL (ref 0.6–1.1)
Calcium: 9.6 mg/dL (ref 8.4–10.4)
Chloride: 103 mEq/L (ref 98–109)
EGFR: 62 mL/min/{1.73_m2} — AB (ref >90)
Glucose: 115 mg/dl (ref 70–140)
Potassium: 4.3 mEq/L (ref 3.5–5.1)
SODIUM: 139 meq/L (ref 136–145)

## 2015-03-26 LAB — CBC & DIFF AND RETIC
BASO%: 0.2 % (ref 0.0–2.0)
Basophils Absolute: 0 10*3/uL (ref 0.0–0.1)
EOS ABS: 0.1 10*3/uL (ref 0.0–0.5)
EOS%: 2.9 % (ref 0.0–7.0)
HCT: 38.4 % (ref 34.8–46.6)
HGB: 12.7 g/dL (ref 11.6–15.9)
Immature Retic Fract: 8.1 % (ref 1.60–10.00)
LYMPH#: 1.4 10*3/uL (ref 0.9–3.3)
LYMPH%: 33.7 % (ref 14.0–49.7)
MCH: 30.6 pg (ref 25.1–34.0)
MCHC: 33.1 g/dL (ref 31.5–36.0)
MCV: 92.5 fL (ref 79.5–101.0)
MONO#: 0.4 10*3/uL (ref 0.1–0.9)
MONO%: 9.5 % (ref 0.0–14.0)
NEUT%: 53.7 % (ref 38.4–76.8)
NEUTROS ABS: 2.3 10*3/uL (ref 1.5–6.5)
Platelets: 183 10*3/uL (ref 145–400)
RBC: 4.15 10*6/uL (ref 3.70–5.45)
RDW: 15 % — ABNORMAL HIGH (ref 11.2–14.5)
RETIC %: 1.57 % (ref 0.70–2.10)
RETIC CT ABS: 65.16 10*3/uL (ref 33.70–90.70)
WBC: 4.2 10*3/uL (ref 3.9–10.3)

## 2015-03-26 LAB — FERRITIN CHCC: Ferritin: 19 ng/ml (ref 9–269)

## 2015-03-26 MED ORDER — ANASTROZOLE 1 MG PO TABS: 1.0000 mg | ORAL_TABLET | Freq: Every day | ORAL | Status: DC

## 2015-03-26 MED ORDER — LIDOCAINE-PRILOCAINE 2.5-2.5 % EX CREA: 1.0000 "application " | TOPICAL_CREAM | CUTANEOUS | Status: DC | PRN

## 2015-03-26 MED ORDER — ESCITALOPRAM OXALATE 10 MG PO TABS: 10.0000 mg | ORAL_TABLET | Freq: Two times a day (BID) | ORAL | Status: DC

## 2015-03-26 NOTE — Progress Notes (Signed)
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 From June 2014 to present, she was noted to be progressively anemic to the point requiring blood transfusion in February 2015.  The patient had undergone extensive evaluation in [place] prior to moving to [place]. Unfortunately I do not have records of those. According to her, she had EGD and colonoscopy and had received multiple units of blood transfusion last year. According to the patient, the hemoglobin dropped to 3 g at some point in time. She had received blood transfusion twice. With the most recent blood transfusion, according to the patient, she had a transfusion reaction. According to the blood typing on 11/19/2013, warm antibody was detected. The patient was prescribed oral iron supplements and she takes it 3 times a day with an empty stomach. She received 1 dose of intravenous iron on 12/09/13. 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 Malignant neoplasm of upper-outer quadrant of female breast   Primary site: Breast (Right)   Staging method: AJCC 7th Edition   Pathologic: Stage IA (T1, N0, cM0) signed by Deatra Robinson, MD on 03/26/2013  8:21 AM   Summary: Stage IA (T1, N0, cM0) In January 2014 she had a mammogram performed that showed an abnormality in the right breast. This was in [place]. 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 Patient went on to have a right lumpectomy performed revealing a T1cN0 disease stage at diagnosis stage I. She subsequently had Oncotype DX performed that showed a recurrence score of 23 giving her a 10 year risk for recurrence but 10-30% in the intermediate risk category. Because of this she received adjuvant chemotherapy consisting of 4 cycles of Taxotere and Cytoxan. She tolerated it well. She completed adjuvant radiation therapy from 03/04/2013 through 04/17/2013. She started taking Arimidex since 05/16/2013.  INTERVAL HISTORY: Please see below for problem oriented charting. She feels well. She is morbidly obese and is attempting to lose weight. She has started exercise program. She decline [place] weighing her today. The patient denies any recent signs or symptoms of bleeding such as spontaneous epistaxis, hematuria or hematochezia. She denies any recent abnormal breast examination, palpable mass, abnormal breast appearance or nipple changes  REVIEW OF SYSTEMS:   Constitutional: Denies fevers, chills or abnormal weight loss Eyes: Denies blurriness of vision Ears, nose, mouth, throat, and face: Denies mucositis or sore throat Respiratory: Denies cough, dyspnea or wheezes Cardiovascular: Denies palpitation, chest discomfort or lower extremity swelling Gastrointestinal:  Denies nausea, heartburn or change in bowel habits Skin: Denies abnormal skin rashes Lymphatics: Denies new lymphadenopathy or easy bruising Neurological:Denies numbness, tingling or new weaknesses Behavioral/Psych: Mood is stable, no new changes  All other systems were reviewed with the patient and are negative.  I have reviewed the past medical history, past surgical history, social history and family history with the patient and they  are unchanged from previous note.  ALLERGIES:  is allergic to contrast media;  albuterol; and prednisone.  MEDICATIONS:  Current Outpatient Prescriptions  Medication Sig Dispense Refill  . anastrozole (ARIMIDEX) 1 MG tablet Take 1 tablet (1 mg total) by mouth daily. 90 tablet 3  . atorvastatin (LIPITOR) 20 MG tablet Take 1 tablet (20 mg total) by mouth daily. 90 tablet 1  . carvedilol (COREG) 3.125 MG tablet Take 1 tablet (3.125 mg total) by mouth 2 (two) times daily with a meal. 180 tablet 1  . clotrimazole (LOTRIMIN) 1 % cream Apply topically 2 (two) times daily. (Patient taking differently: Apply 1 application topically 2 (two) times daily as needed (for rash). ) 30 g 0  . docusate sodium 100 MG CAPS Take 100 mg by mouth 2 (two) times daily. 10 capsule 0  . escitalopram (LEXAPRO) 20 MG tablet Take 1 tablet (20 mg total) by mouth daily. 90 tablet 1  . furosemide (LASIX) 80 MG tablet Take 1 tablet (80 mg total) by mouth daily. 90 tablet 1  . HYDROmorphone (DILAUDID) 4 MG tablet Take 1 tablet (4 mg total) by mouth every 4 (four) hours as needed for severe pain (Pt states she is allowed 5 a day). 24 tablet 0  . lidocaine (LIDODERM) 5 % Place 1-2 patches onto the skin daily. Remove & Discard patch within 12 hours or as directed by MD    . lidocaine-prilocaine (EMLA) cream Apply 1 application topically as needed (port access). 90 g 1  . Linaclotide (LINZESS) 290 MCG CAPS capsule Take 1 capsule (290 mcg total) by mouth daily. 90 capsule 1  . lisinopril (PRINIVIL,ZESTRIL) 5 MG tablet TAKE 1 TABLET BY MOUTH DAILY 30 tablet 5  . LORazepam (ATIVAN) 0.5 MG tablet TAKE 1 TABLET BY MOUTH EVERY 8 HOURS AS NEEDED FOR ANXIETY 90 tablet 2  . Multiple Vitamins-Minerals (HAIR/SKIN/NAILS PO) Take 1 tablet by mouth every morning.    . OPANA ER, CRUSH RESISTANT, 20 MG T12A   0  . pantoprazole (PROTONIX) 40 MG tablet Take 1 tablet (40 mg total) by mouth daily. 90 tablet 1  . potassium chloride SA (K-DUR,KLOR-CON) 20 MEQ tablet Take 1 tablet (20 mEq total) by mouth 3 (three) times daily. 270  tablet 1  . escitalopram (LEXAPRO) 10 MG tablet Take 1 tablet (10 mg total) by mouth 2 (two) times daily. 60 tablet 3  . lidocaine-prilocaine (EMLA) cream Apply 1 application topically as needed. 30 g 6   No current facility-administered medications for this visit.    PHYSICAL EXAMINATION: ECOG PERFORMANCE STATUS: 1 - Symptomatic but completely ambulatory  Filed Vitals:   03/26/15 1436  BP: 155/62  Pulse: 91  Temp: 98.1 F (36.7 C)  Resp: 18   Filed Weights    GENERAL:alert, no distress and comfortable. She is morbidly obese SKIN: skin color, texture, turgor are normal, no rashes or significant lesions EYES: normal, Conjunctiva are pink and non-injected, sclera clear OROPHARYNX:no exudate, no erythema and lips, buccal mucosa, and tongue normal  NECK: supple, thyroid normal size, non-tender, without nodularity LYMPH:  no palpable lymphadenopathy in the cervical, axillary or inguinal LUNGS: clear to auscultation and percussion with normal breathing effort HEART: regular rate & rhythm and no murmurs and no lower extremity edema ABDOMEN:abdomen soft, non-tender and normal bowel sounds Musculoskeletal:no cyanosis of digits and no clubbing  NEURO: alert & oriented x 3 with fluent speech, no focal motor/sensory deficits Well-healed lumpectomy scar in the right breast, no other abnormalities  LABORATORY DATA:    I have reviewed the data as listed    Component Value Date/Time   NA 139 03/26/2015 1457   NA 137 12/08/2014 0520   K 4.3 03/26/2015 1457   K 3.2* 12/08/2014 0520   CL 100 12/08/2014 0520   CL 99 03/04/2013 1239   CO2 29 03/26/2015 1457   CO2 27 12/08/2014 0520   GLUCOSE 115 03/26/2015 1457   GLUCOSE 155* 12/08/2014 0520   GLUCOSE 125* 03/04/2013 1239   BUN 21.1 03/26/2015 1457   BUN 9 12/08/2014 0520   CREATININE 1.0 03/26/2015 1457   CREATININE 0.91 12/08/2014 0520   CALCIUM 9.6 03/26/2015 1457   CALCIUM 8.8 12/08/2014 0520   PROT 7.0 12/04/2014 1815   PROT 7.7  01/01/2014 1400   ALBUMIN 3.6 12/04/2014 1815   ALBUMIN 3.7 01/01/2014 1400   AST 30 12/04/2014 1815   AST 32 01/01/2014 1400   ALT 31 12/04/2014 1815   ALT 29 01/01/2014 1400   ALKPHOS 159* 12/04/2014 1815   ALKPHOS 155* 01/01/2014 1400   BILITOT 0.6 12/04/2014 1815   BILITOT 0.25 01/01/2014 1400   GFRNONAA 67* 12/08/2014 0520   GFRAA 77* 12/08/2014 0520    No results found for: SPEP, UPEP  Lab Results  Component Value Date   WBC 4.2 03/26/2015   NEUTROABS 2.3 03/26/2015   HGB 12.7 03/26/2015   HCT 38.4 03/26/2015   MCV 92.5 03/26/2015   PLT 183 03/26/2015      Chemistry      Component Value Date/Time   NA 139 03/26/2015 1457   NA 137 12/08/2014 0520   K 4.3 03/26/2015 1457   K 3.2* 12/08/2014 0520   CL 100 12/08/2014 0520   CL 99 03/04/2013 1239   CO2 29 03/26/2015 1457   CO2 27 12/08/2014 0520   BUN 21.1 03/26/2015 1457   BUN 9 12/08/2014 0520   CREATININE 1.0 03/26/2015 1457   CREATININE 0.91 12/08/2014 0520      Component Value Date/Time   CALCIUM 9.6 03/26/2015 1457   CALCIUM 8.8 12/08/2014 0520   ALKPHOS 159* 12/04/2014 1815   ALKPHOS 155* 01/01/2014 1400   AST 30 12/04/2014 1815   AST 32 01/01/2014 1400   ALT 31 12/04/2014 1815   ALT 29 01/01/2014 1400   BILITOT 0.6 12/04/2014 1815   BILITOT 0.25 01/01/2014 1400     Recent mammogram was negative. ASSESSMENT & PLAN:  Breast cancer, right breast Clinically, she has no signs of disease recurrence. She will continue taking Arimidex until August 2019. Recent mammogram was negative. She is due for bone density scan. Per patient request, she wants the bone density scan to be done in September. She has rescheduled appointment to get her port removed until the fall which I think is reasonable. I will continue to see her twice a year, next appointment would be next year. I refilled her prescription of anastrozole today. She will take Lexapro for management of night sweats/menopausal symptoms. Per  patient request, she wants Lexapro to be twice a day as she felt it would be better tolerated.  Iron deficiency anemia She is no longer anemic. Recheck iron studies are pending.  Obesity She is morbidly obese. She has declined bariatric surgery and is motivated to exercise.   Orders Placed This Encounter  Procedures  . DG Bone Density    Standing Status: Future     Number of Occurrences:      Standing Expiration Date: 04/29/2016    Order Specific Question:  Reason for   exam:    Answer:  breast ca, osteopenia    Order Specific Question:  Preferred imaging location?    Answer[facility]  . Basic metabolic panel    Standing Status: Future     Number of Occurrences: 1     Standing Expiration Date: 04/29/2016  . CBC & Diff and Retic    Standing Status: Future     Number of Occurrences:      Standing Expiration Date: 04/29/2016  . Ferritin    Standing Status: Future     Number of Occurrences:      Standing Expiration Date: 04/29/2016   All questions were answered. The patient knows to call the clinic with any problems, questions or concerns. No barriers to learning was detected. I spent 15 minutes counseling the patient face to face. The total time spent in the appointment was 20 minutes and more than 50% was on counseling and review of test results     , , MD 03/26/2015 3:13 PM    

## 2015-03-26 NOTE — Telephone Encounter (Signed)
per pof to sch pt appt-sch pt DEXA-mailed copy of pt avs-pt left office

## 2015-03-26 NOTE — Assessment & Plan Note (Signed)
She is morbidly obese. She has declined bariatric surgery and is motivated to exercise.

## 2015-03-26 NOTE — Assessment & Plan Note (Signed)
Clinically, she has no signs of disease recurrence. She will continue taking Arimidex until August 2019. Recent mammogram was negative. She is due for bone density scan. Per patient request, she wants the bone density scan to be done in September. She has rescheduled appointment to get her port removed until the fall which I think is reasonable. I will continue to see her twice a year, next appointment would be next year. I refilled her prescription of anastrozole today. She will take Lexapro for management of night sweats/menopausal symptoms. Per patient request, she wants Lexapro to be twice a day as she felt it would be better tolerated.

## 2015-03-26 NOTE — Assessment & Plan Note (Signed)
She is no longer anemic. Recheck iron studies are pending.

## 2015-03-27 LAB — SEDIMENTATION RATE: SED RATE: 6 mm/h (ref 0–30)

## 2015-03-29 ENCOUNTER — Telehealth: Payer: Self-pay | Admitting: *Deleted

## 2015-03-29 NOTE — Telephone Encounter (Signed)
-----   Message from Heath Lark, MD sent at 03/26/2015  4:06 PM EDT ----- Regarding: test results Potassium OK Iron level is down again. Recommend she takes 1 tab OTC iron at bedtime No need IV iron, will retest again next visit  ----- Message -----    From: [facility]    Sent: 03/26/2015   2:49 PM      To: Heath Lark, MD

## 2015-03-29 NOTE — Telephone Encounter (Signed)
LVM for pt informing of Dr. Calton Dach message below and asked her to call back if any questions.

## 2015-04-05 DIAGNOSIS — G894 Chronic pain syndrome: Secondary | ICD-10-CM | POA: Diagnosis not present

## 2015-04-05 DIAGNOSIS — Z79899 Other long term (current) drug therapy: Secondary | ICD-10-CM | POA: Diagnosis not present

## 2015-04-05 DIAGNOSIS — M792 Neuralgia and neuritis, unspecified: Secondary | ICD-10-CM | POA: Diagnosis not present

## 2015-04-05 DIAGNOSIS — G629 Polyneuropathy, unspecified: Secondary | ICD-10-CM | POA: Diagnosis not present

## 2015-04-05 DIAGNOSIS — M545 Low back pain: Secondary | ICD-10-CM | POA: Diagnosis not present

## 2015-04-05 DIAGNOSIS — M179 Osteoarthritis of knee, unspecified: Secondary | ICD-10-CM | POA: Diagnosis not present

## 2015-04-15 DIAGNOSIS — M1711 Unilateral primary osteoarthritis, right knee: Secondary | ICD-10-CM | POA: Diagnosis not present

## 2015-04-15 DIAGNOSIS — M1712 Unilateral primary osteoarthritis, left knee: Secondary | ICD-10-CM | POA: Diagnosis not present

## 2015-04-19 DIAGNOSIS — I872 Venous insufficiency (chronic) (peripheral): Secondary | ICD-10-CM | POA: Diagnosis not present

## 2015-04-19 DIAGNOSIS — B354 Tinea corporis: Secondary | ICD-10-CM | POA: Diagnosis not present

## 2015-04-19 DIAGNOSIS — I8310 Varicose veins of unspecified lower extremity with inflammation: Secondary | ICD-10-CM | POA: Diagnosis not present

## 2015-04-20 ENCOUNTER — Telehealth: Payer: Self-pay | Admitting: *Deleted

## 2015-04-20 DIAGNOSIS — Z779 Other contact with and (suspected) exposures hazardous to health: Secondary | ICD-10-CM | POA: Diagnosis not present

## 2015-04-20 DIAGNOSIS — N952 Postmenopausal atrophic vaginitis: Secondary | ICD-10-CM | POA: Diagnosis not present

## 2015-04-20 DIAGNOSIS — Z124 Encounter for screening for malignant neoplasm of cervix: Secondary | ICD-10-CM | POA: Diagnosis not present

## 2015-04-20 NOTE — Telephone Encounter (Signed)
"  I'm having a problem I went to see my Editor, commissioning for.  She said I should use premarin vaginal cream due to thinning of this area estroogen will help.  I just can't have sex, it's too painful.  I know I should not use estrogen but can I use this vaginally?  Call me at [phone]"  Called patient to verify name of cream and will ask Dr. Alvy Bimler if this estrogen absorbed vaginally is contraindicated.  "If this can't be used is there something else she can use instead."

## 2015-04-21 NOTE — Telephone Encounter (Signed)
She had estrogen positive breast ca Any estrogen containing products including premarin is not recommended She can try lubricants such as K Y Jelly or consider stopping Arimidex If it would be helpful I can arrange appt to see her next Tues

## 2015-04-21 NOTE — Telephone Encounter (Signed)
Called patient to provide Dr. Alvy Bimler instructions.  Message left with Dr. Alvy Bimler information.  Awaiting return call from patient in reference to appointment at [facility] next Tuesday.

## 2015-04-22 DIAGNOSIS — M1712 Unilateral primary osteoarthritis, left knee: Secondary | ICD-10-CM | POA: Diagnosis not present

## 2015-04-22 DIAGNOSIS — M1711 Unilateral primary osteoarthritis, right knee: Secondary | ICD-10-CM | POA: Diagnosis not present

## 2015-04-23 NOTE — Telephone Encounter (Signed)
No return call and unable to reach via phone.  No message left.

## 2015-04-29 DIAGNOSIS — M170 Bilateral primary osteoarthritis of knee: Secondary | ICD-10-CM | POA: Diagnosis not present

## 2015-05-03 ENCOUNTER — Other Ambulatory Visit: Payer: Self-pay | Admitting: Internal Medicine

## 2015-05-03 DIAGNOSIS — M545 Low back pain: Secondary | ICD-10-CM | POA: Diagnosis not present

## 2015-05-03 DIAGNOSIS — G629 Polyneuropathy, unspecified: Secondary | ICD-10-CM | POA: Diagnosis not present

## 2015-05-03 DIAGNOSIS — Z79899 Other long term (current) drug therapy: Secondary | ICD-10-CM | POA: Diagnosis not present

## 2015-05-03 DIAGNOSIS — M179 Osteoarthritis of knee, unspecified: Secondary | ICD-10-CM | POA: Diagnosis not present

## 2015-05-03 DIAGNOSIS — G894 Chronic pain syndrome: Secondary | ICD-10-CM | POA: Diagnosis not present

## 2015-05-03 DIAGNOSIS — M79606 Pain in leg, unspecified: Secondary | ICD-10-CM | POA: Diagnosis not present

## 2015-05-20 NOTE — Patient Outreach (Signed)
[facility]) Care Management  05/20/2015  Alexandria [name] [birth year] [id number]   Referral from [facility], assigned Burgess Amor, RN to outreach.  Thanks, [name]. [place], [place] Assistant Phone: [phone] Fax: [phone]

## 2015-05-25 ENCOUNTER — Other Ambulatory Visit: Payer: Self-pay | Admitting: *Deleted

## 2015-05-25 NOTE — Patient Outreach (Signed)
[facility] program screening Call to patient home, no answer, left VM with RNCM contact information. Plan to await call back If not call will attempt again next week. Royetta Crochet. Laymond Purser, RN, BSN, [place] [phone]

## 2015-05-31 ENCOUNTER — Ambulatory Visit (INDEPENDENT_AMBULATORY_CARE_PROVIDER_SITE_OTHER): Payer: 59 | Admitting: Pulmonary Disease

## 2015-05-31 ENCOUNTER — Encounter: Payer: Self-pay | Admitting: Pulmonary Disease

## 2015-05-31 VITALS — HR 70 | Ht 66.0 in | Wt 251.8 lb

## 2015-05-31 DIAGNOSIS — R0600 Dyspnea, unspecified: Secondary | ICD-10-CM

## 2015-05-31 DIAGNOSIS — R059 Cough, unspecified: Secondary | ICD-10-CM

## 2015-05-31 DIAGNOSIS — I2699 Other pulmonary embolism without acute cor pulmonale: Secondary | ICD-10-CM | POA: Diagnosis not present

## 2015-05-31 DIAGNOSIS — R05 Cough: Secondary | ICD-10-CM | POA: Diagnosis not present

## 2015-05-31 DIAGNOSIS — K449 Diaphragmatic hernia without obstruction or gangrene: Secondary | ICD-10-CM

## 2015-05-31 MED ORDER — RANITIDINE HCL 150 MG PO TABS: 150.0000 mg | ORAL_TABLET | Freq: Every day | ORAL | Status: AC

## 2015-05-31 NOTE — Patient Instructions (Signed)
1. Contact her cardiologist to adjust your Lasix to help improve your edema and breathing. 2. We are going to schedule breathing tests and a walking test or your return visit. 3. Please avoid eating or drinking excessive liquids within 3 hours of bedtime. I'm going to start her on Zantac at night before bedtime in case your hiatal hernia and reflux is contributing to your cough. 4. You will return to clinic in 1-2 months but please call if you have any further questions or concerns.

## 2015-05-31 NOTE — Progress Notes (Signed)
Subjective:    Patient ID: Alexandria [name], female    DOB: 1951-06-30, 64 y.o.   MRN: [id number]  C.C.:  Follow-up for Dyspnea on Exertion, Cough, Morbid Obesity, H/O PE, & Hiatal Hernia.  HPI Dyspnea on Exertion: Multifactorial given the patient's morbid obesity & diastolic congestive heart failure. Currently on Lasix. She reports she has continued to gain weight - 12 pounds in the last 2 weeks. She has increased lower extremity edema. She reports decreased UOP despite taking Lasix.   Cough:  Developed a cough productive of a green-yellow mucus approximately 1 month ago. The cough seems to be worse at night. Volume of mucus seems to be increasing. No fever, chills, or sweats recently.   Morbid Obesity:  Patient does have increasing weight. She has increasing edema contributing to her weight. She is going to physical/aqua therapy to help her lose weight. She has significant pain from her joints that inhibits her physical capabilities.  H/O PE:  Around 37 or 1988. During pregnancy. On systemic anticoagulation for 1 year.  Hiatal Hernia: Seen on chest CT imaging. Previously prescribed Protonix but hasn't been taking. Denies any reflux or dyspepsia. Does have morning brash water taste at times.    Review of Systems She denies any recent chest pain or pressure. She has had sinus congestion and pressure with post-nasal drainage.  Allergies  Allergen Reactions  . Contrast Media [Iodinated Diagnostic Agents] Anaphylaxis  . Albuterol Other (See Comments)    Anxious   . Prednisone Other (See Comments)    Pt gets very agitated when she takes high doses of steroids   Current Outpatient Prescriptions on File Prior to Visit  Medication Sig Dispense Refill  . anastrozole (ARIMIDEX) 1 MG tablet Take 1 tablet (1 mg total) by mouth daily. 90 tablet 3  . atorvastatin (LIPITOR) 20 MG tablet Take 1 tablet (20 mg total) by mouth daily. 90 tablet 1  . docusate sodium 100 MG CAPS Take 100 mg by mouth 2  (two) times daily. 10 capsule 0  . escitalopram (LEXAPRO) 10 MG tablet TAKE 1 TABLET BY MOUTH TWICE DAILY 180 tablet 3  . furosemide (LASIX) 80 MG tablet Take 1 tablet (80 mg total) by mouth daily. 90 tablet 1  . Linaclotide (LINZESS) 290 MCG CAPS capsule Take 1 capsule (290 mcg total) by mouth daily. 90 capsule 1  . lisinopril (PRINIVIL,ZESTRIL) 5 MG tablet TAKE 1 TABLET BY MOUTH DAILY 30 tablet 5  . LORazepam (ATIVAN) 0.5 MG tablet TAKE 1 TABLET BY MOUTH EVERY 8 HOURS AS NEEDED FOR ANXIETY 90 tablet 1  . Multiple Vitamins-Minerals (HAIR/SKIN/NAILS PO) Take 1 tablet by mouth every morning.    . potassium chloride SA (K-DUR,KLOR-CON) 20 MEQ tablet Take 1 tablet (20 mEq total) by mouth 3 (three) times daily. (Patient taking differently: Take 20 mEq by mouth 2 (two) times daily. ) 270 tablet 1  . carvedilol (COREG) 3.125 MG tablet Take 1 tablet (3.125 mg total) by mouth 2 (two) times daily with a meal. (Patient not taking: Reported on 05/31/2015) 180 tablet 1  . lidocaine (LIDODERM) 5 % Place 1-2 patches onto the skin daily. Remove & Discard patch within 12 hours or as directed by MD    . lidocaine-prilocaine (EMLA) cream Apply 1 application topically as needed (port access). (Patient not taking: Reported on 05/31/2015) 90 g 1  . lidocaine-prilocaine (EMLA) cream Apply 1 application topically as needed. (Patient not taking: Reported on 05/31/2015) 30 g 6   No current facility-administered  medications on file prior to visit.   Past Medical History  Diagnosis Date  . Iron deficiency anemia   . Status post chemotherapy     4 cycles of Taxotere and cytoxan  . Chronic back pain     "all the way up and down"  . Hx of pulmonary embolus     During c-Section  . Peripheral neuropathic pain   . Obesity     Class 2  . S/P radiation therapy  03/04/2013-04/17/2013    1) Right breast / 50 Gy in 25 fractions/ 2) Right breast boost / 10 Gy in 5 fractions  . Diastolic heart failure   . Hx of radiation therapy  03/04/13- 04/17/13    right breast 50 Gy 25 fractions, right breast boost 10 Gy 5 fractions  . Anxiety   . H/O hiatal hernia   . 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 GAD (generalized anxiety disorder)   . 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 Louretta Shorten  . Lymphedema     bilateral lower extremity  . Complication of anesthesia     "I come out really anxious; need Ativan to ease me out" (09/07/2014)  . Hypertension   . Diastolic dysfunction   . Pneumonia     "at least twice/yr" (09/07/2014)  . Chronic bronchitis     "get it q yr" (09/07/2014)  . On home oxygen therapy     "2L just at night" (09/07/2014)  . History of blood transfusion "3-4"    "never can tell why; don't know where the blood was coming from; doesn't show up in stool or urine"  . Daily headache     "recently" (09/07/2014)  . DDD (degenerative disc disease), cervical   . DDD (degenerative disc disease), lumbosacral   . DDD (degenerative disc disease), thoracolumbar   . Fibromyalgia   . Hyperlipidemia    Past Surgical History  Procedure Laterality Date  . Cesarean section  6010; 1978; 1980;     "stillborn in 53"  . Partial colectomy N/A 07/04/2014    Procedure: REPAIR OF INCARCERATED INCISIONAL HERNIA WITH MESH;  Surgeon: Excell Seltzer, [place];  Location: [facility];  Service: General;  Laterality: N/A;  . Tonsillectomy    . Appendectomy    . Breast biopsy Right   . Breast lumpectomy Right   . Inguinal hernia repair Bilateral     "cancer"  . Umbilical hernia repair  X 2  . Dilation and curettage of uterus  "numerous"  . Abdominal hysterectomy    . Foot surgery Right ~ 2013 X 3    "put pins in but pins kept breaking"   Family History  Problem Relation Age of Onset  . Parkinson's disease Mother   . Emphysema Maternal Grandfather   . COPD Maternal Grandfather   . COPD Sister   . CAD Neg  Hx   . COPD Maternal Grandmother    Social History   Social History  . Marital Status: Married    Spouse Name: N/A  . Number of Children: N/A  . Years of Education: N/A   Occupational History  . retired Therapist, sports    Social History Main Topics  . Smoking status: Passive Smoke Exposure - Never Smoker  . Smokeless tobacco: Never Used     Comment: Through father  . Alcohol Use: No  . Drug Use:  No  . Sexual Activity: Not Currently   Other Topics Concern  . None   Social History Narrative   Originally from [place], [place]. Previously lived in [place]. [place] to [facility] in 2014. Previously worked as an Warden/ranger. No known TB exposure. No pets currently. No bird exposure. Minimal mold exposure.       Objective:   Physical Exam Pulse 70, height 5' 6"  (1.676 m), weight 251 lb 12.8 oz (114.216 kg), last menstrual period 01/30/2005, SpO2 94 %. General:  Awake. Alert. No acute distress. Morbidly obese. Integument:  Warm & dry. No rash on exposed skin. No bruising. HEENT:  Moist mucus membranes. No oral ulcers. No scleral injection or icterus. Severe bilateral nasal turbinate swelling with pale mucosa. PERRL. Cardiovascular:  Regular rate. Pitting bilateral lower extremity up to the thigh. JVD to the angle of jaw.  Pulmonary:  Distant breath sounds bilaterally. Symmetric chest wall expansion. No accessory muscle use on room air. Abdomen: Soft. Normal bowel sounds. Protuberant. Neurological:  CN 2-12 grossly in tact. No meningismus. Moving all 4 extremities equally.   IMAGING CT CHEST W/O 12/05/14 (personally reviewed by me): No pleural effusion or thickening. No pericardial effusion. Patchy dependent opacities favoring atelectasis. No other nodule or mass appreciated. No pathologic mediastinal adenopathy. Large hernia present.  CARDIAC TTE (09/09/14): LV normal in size. Grade 1 diastolic dysfunction. EF 55-60%. No regional wall motion abnormalities. LA & RA normal in size. RV normal in size and function.  Trivial tricuspid regurgitation. No aortic stenosis or regurgitation. No mitral stenosis or regurgitation. No pulmonic regurgitation. No pericardial effusion.  LABS 03/26/15 CBC: 4.2/12.6/30.4/183 ESR: 6 BMP: 139/4.3/103/29/21/1.0/115/9.6    Assessment & Plan:  64 year old female with underlying diastolic congestive heart failure and ongoing problems with volume overload. Likely this is contributing to her physical exam findings as well as her cough. I am concerned that she is having some GERD from her underlying hiatal hernia as she has stopped her Protonix. She does have some edema bilateral nasal turbinates would suggest an allergic component to her sinus congestion, but again with the degree of edema she is having I'm not sure if this is truly an allergy. I encouraged the patient to continue with her physical therapy and efforts for weight loss. I instructed her to contact my office if she had any new breathing problems before next appointment.  1. Dyspnea: Multifactorial. Recommended contacting her cardiologist to adjust her Lasix. Checking full pulmonary function testing and 6 minute walk test at next appointment.  2. Cough: Multifactorial. Initiating treatment with Zantac for hiatal hernia & reflux. Patient to address diuretic with her cardiologist. 3. History of pulmonary embolus: This occurred during pregnancy. Treated with anticoagulation for 1 year per her report. 4. Morbid obesity: Patient currently participating in physical therapy. Encouraged her to continue to do so. 5. Hiatal hernia: Starting Zantac 150 milligrams by mouth daily at bedtime. Patient advised to avoid eating an excessive fluid intake within 3 hours of bedtime. 6. Questionable allergic rhinitis: Recommended twice-daily saline nasal rinse. 7. Follow-up: Patient to return to clinic in 1-2 months or sooner if needed.

## 2015-06-01 ENCOUNTER — Telehealth: Payer: Self-pay | Admitting: *Deleted

## 2015-06-01 NOTE — Telephone Encounter (Signed)
Called pt and LMTCB x1 Need to r/s OV, 6MW and PFT to diff day

## 2015-06-01 NOTE — Telephone Encounter (Signed)
Pt returned call resch her for 10/19 (10:30 11 and 2).Stanley A Dalton]

## 2015-06-02 ENCOUNTER — Other Ambulatory Visit: Payer: Self-pay | Admitting: Internal Medicine

## 2015-06-02 ENCOUNTER — Other Ambulatory Visit: Payer: Self-pay | Admitting: Family Medicine

## 2015-06-07 ENCOUNTER — Ambulatory Visit (INDEPENDENT_AMBULATORY_CARE_PROVIDER_SITE_OTHER): Payer: 59 | Admitting: Cardiology

## 2015-06-07 ENCOUNTER — Encounter: Payer: Self-pay | Admitting: Cardiology

## 2015-06-07 ENCOUNTER — Other Ambulatory Visit: Payer: Self-pay | Admitting: Cardiology

## 2015-06-07 VITALS — BP 162/86 | HR 92 | Ht 66.0 in | Wt 251.0 lb

## 2015-06-07 DIAGNOSIS — Z79899 Other long term (current) drug therapy: Secondary | ICD-10-CM

## 2015-06-07 DIAGNOSIS — I10 Essential (primary) hypertension: Secondary | ICD-10-CM | POA: Diagnosis not present

## 2015-06-07 DIAGNOSIS — I5032 Chronic diastolic (congestive) heart failure: Secondary | ICD-10-CM

## 2015-06-07 DIAGNOSIS — R609 Edema, unspecified: Secondary | ICD-10-CM | POA: Diagnosis not present

## 2015-06-07 DIAGNOSIS — R0609 Other forms of dyspnea: Secondary | ICD-10-CM

## 2015-06-07 MED ORDER — METOLAZONE 2.5 MG PO TABS: ORAL_TABLET | ORAL | Status: DC

## 2015-06-07 MED ORDER — CARVEDILOL 3.125 MG PO TABS: 3.1250 mg | ORAL_TABLET | Freq: Two times a day (BID) | ORAL | Status: DC

## 2015-06-07 NOTE — Progress Notes (Signed)
Cardiology Office Note   Date:  06/07/2015   ID:  Alexandria [name], DOB October 15, 1950, MRN [id number]  PCP:  Nyoka Cowden, [place]  Cardiologist:  Dr. Adora Fridge     Chief Complaint  Patient presents with  . Follow-up    fluid on legs, c/o no chest pain or dizsiness  . Shortness of Breath    a lot  . Edema    in legs and ankles      History of Present Illness: Alexandria [name] is a 64 y.o. female who presents for Increasing lower ext edema. Wt will decrease with activity but then edema occurs and wt climbs.  She is tearful about this today.  She has not increased her salt intake, does not eat out very often.  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  She has stopped her coreg, thought she was only taking for her HR.    Last saw Dr. Adora Fridge 10/2014 -She has no cardiovascular risk factors. She has had breast cancer last year and had chemotherapy and radiation therapy as well as a lumpectomy. She has gained 20 pounds since visit with Dr. Adora Fridge . She also has bilateral lower extremity edema.   She's had a 2-D echo about a year ago that showed normal systolic function with diastolic dysfunction. She's been hospitalized twice last year because of respiratory failure thought to be related to pneumonia and volume overload.  She saw her Pul. Who added zantac to prevent cough from GERD.  She has no chest pain and no SOB with lying flat though she sleeps in a sleep number bed.   Last echo 08/2014: Study Conclusions  - Left ventricle: The cavity size was normal. Wall thickness was normal. Systolic function was normal. The estimated ejection fraction was in the range of 55% to 60%. Wall motion was normal; there were no regional wall motion abnormalities. Doppler parameters are consistent with abnormal left ventricular relaxation (grade 1 diastolic dysfunction). Doppler parameters are consistent with high ventricular filling pressure. - Mitral valve:  Calcified annulus.  Impressions:  - Normal LV function; grade 1 diastolic dysfunction; elevated left ventricular filling pressure  Past Medical History  Diagnosis Date  . Iron deficiency anemia   . Status post chemotherapy     4 cycles of Taxotere and cytoxan  . Chronic back pain     "all the way up and down"  . Hx of pulmonary embolus     During c-Section  . Peripheral neuropathic pain   . Obesity     Class 2  . S/P radiation therapy  03/04/2013-04/17/2013    1) Right breast / 50 Gy in 25 fractions/ 2) Right breast boost / 10 Gy in 5 fractions  . Diastolic heart failure   . Hx of radiation therapy 03/04/13- 04/17/13    right breast 50 Gy 25 fractions, right breast boost 10 Gy 5 fractions  . Anxiety   . H/O hiatal hernia   . 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 GAD (generalized anxiety disorder)   . 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 Louretta Shorten  . Lymphedema     bilateral lower extremity  . Complication of anesthesia     "I come out really anxious; need Ativan to ease me out" (09/07/2014)  . Hypertension   . Diastolic  dysfunction   . Pneumonia     "at least twice/yr" (09/07/2014)  . Chronic bronchitis     "get it q yr" (09/07/2014)  . On home oxygen therapy     "2L just at night" (09/07/2014)  . History of blood transfusion "3-4"    "never can tell why; don't know where the blood was coming from; doesn't show up in stool or urine"  . Daily headache     "recently" (09/07/2014)  . DDD (degenerative disc disease), cervical   . DDD (degenerative disc disease), lumbosacral   . DDD (degenerative disc disease), thoracolumbar   . Fibromyalgia   . Hyperlipidemia     Past Surgical History  Procedure Laterality Date  . Cesarean section  4193; 1978; 1980;     "stillborn in 81"  . Partial colectomy N/A 07/04/2014    Procedure: REPAIR OF  INCARCERATED INCISIONAL HERNIA WITH MESH;  Surgeon: Excell Seltzer, MD;  Location: [facility];  Service: General;  Laterality: N/A;  . Tonsillectomy    . Appendectomy    . Breast biopsy Right   . Breast lumpectomy Right   . Inguinal hernia repair Bilateral     "cancer"  . Umbilical hernia repair  X 2  . Dilation and curettage of uterus  "numerous"  . Abdominal hysterectomy    . Foot surgery Right ~ 2013 X 3    "put pins in but pins kept breaking"     Current Outpatient Prescriptions  Medication Sig Dispense Refill  . anastrozole (ARIMIDEX) 1 MG tablet Take 1 tablet (1 mg total) by mouth daily. 90 tablet 3  . atorvastatin (LIPITOR) 20 MG tablet Take 1 tablet (20 mg total) by mouth daily. 90 tablet 1  . cyclobenzaprine (FLEXERIL) 5 MG tablet Take 1 tablet by mouth as needed.    . docusate sodium 100 MG CAPS Take 100 mg by mouth 2 (two) times daily. 10 capsule 0  . escitalopram (LEXAPRO) 10 MG tablet TAKE 1 TABLET BY MOUTH TWICE DAILY 180 tablet 3  . furosemide (LASIX) 80 MG tablet Take 1 tablet (80 mg total) by mouth daily. 90 tablet 1  . furosemide (LASIX) 80 MG tablet TAKE 1 TABLET BY MOUTH TWICE DAILY, MAY TAKE AN EXTRA TABLET IF NEEDED FOR INCREASED SWELLING OR RAPID WEIGHT GAIN 270 tablet 3  . lidocaine (LIDODERM) 5 % Place 1-2 patches onto the skin daily. Remove & Discard patch within 12 hours or as directed by MD    . lidocaine-prilocaine (EMLA) cream Apply 1 application topically as needed (port access). 90 g 1  . lidocaine-prilocaine (EMLA) cream Apply 1 application topically as needed. 30 g 6  . Linaclotide (LINZESS) 290 MCG CAPS capsule Take 1 capsule (290 mcg total) by mouth daily. 90 capsule 1  . lisinopril (PRINIVIL,ZESTRIL) 5 MG tablet TAKE 1 TABLET BY MOUTH DAILY 30 tablet 5  . LORazepam (ATIVAN) 0.5 MG tablet TAKE 1 TABLET BY MOUTH EVERY 8 HOURS AS NEEDED FOR ANXIETY 90 tablet 1  . Multiple Vitamins-Minerals (HAIR/SKIN/NAILS PO) Take 1 tablet by mouth every morning.    Marland Kitchen  oxyCODONE-acetaminophen (PERCOCET) 10-325 MG per tablet Take 1 tablet by mouth as needed.    Marland Kitchen oxymorphone (OPANA ER) 30 MG 12 hr tablet Take 30 mg by mouth 2 (two) times daily.    Marland Kitchen oxymorphone (OPANA ER) 40 MG 12 hr tablet Take 1 tablet by mouth 2 (two) times daily.    Marland Kitchen oxymorphone (OPANA) 10 MG tablet Take 1 tablet by mouth as  needed.  0  . pantoprazole (PROTONIX) 40 MG tablet Take 1 tablet by mouth as needed.    . potassium chloride SA (K-DUR,KLOR-CON) 20 MEQ tablet Take 1 tablet (20 mEq total) by mouth 3 (three) times daily. (Patient taking differently: Take 20 mEq by mouth 2 (two) times daily. ) 270 tablet 1  . ranitidine (ZANTAC) 150 MG tablet Take 1 tablet (150 mg total) by mouth at bedtime. 30 tablet 3  . carvedilol (COREG) 3.125 MG tablet Take 1 tablet (3.125 mg total) by mouth 2 (two) times daily with a meal. (Patient not taking: Reported on 05/31/2015) 180 tablet 1   No current facility-administered medications for this visit.    Allergies:   Contrast media; Albuterol; and Prednisone    Social History:  The patient  reports that she has been passively smoking.  She has never used smokeless tobacco. She reports that she does not drink alcohol or use illicit drugs.   Family History:  The patient's family history includes COPD in her maternal grandfather, maternal grandmother, and sister; Emphysema in her maternal grandfather; Parkinson's disease in her mother. There is no history of CAD.    ROS:  General:no colds or fevers, elevated weight changes Skin:no rashes or ulcers HEENT:no blurred vision, no congestion CV:see HPI PUL:see HPI GI:no diarrhea constipation or melena, no indigestion GU:no hematuria, no dysuria MS:+ bil knee joint pain, no claudication Neuro:no syncope, no lightheadedness Endo:no diabetes, no thyroid disease  Wt Readings from Last 3 Encounters:  06/07/15 251 lb (113.853 kg)  05/31/15 251 lb 12.8 oz (114.216 kg)  12/06/14 240 lb 11.2 oz (109.181 kg)      PHYSICAL EXAM: VS:  BP 162/86 mmHg  Pulse 92  Ht _0  (1.676 m)  Wt 251 lb (113.853 kg)  BMI 40.53 kg/m2  LMP 01/30/2005 , BMI Body mass index is 40.53 kg/(m^2). General:Pleasant affect, NAD Skin:Warm and dry, brisk capillary refill HEENT:normocephalic, sclera clear, mucus membranes moist Neck:supple, no JVD, no bruits  Heart:S1S2 RRR without murmur, gallup, rub or click Lungs:clear without rales, rhonchi, or wheezes MVH:QIONG, soft, non tender, + BS, do not palpate liver spleen or masses Ext:1-2+ lower ext edema, 2+ radial pulses Neuro:alert and oriented X 3, MAE, follows commands, + facial symmetry    EKG:  EKG is NOT ordered today.    Recent Labs: 09/07/2014: Pro B Natriuretic peptide (BNP) 304.7* 12/04/2014: ALT 31 12/05/2014: B Natriuretic Peptide 25.5 12/06/2014: TSH 0.043* 03/26/2015: BUN 21.1; Creatinine 1.0; HGB 12.7; Platelets 183; Potassium 4.3; Sodium 139    Lipid Panel    Component Value Date/Time   CHOL 185 05/22/2014 1625   TRIG 200* 05/22/2014 1625   HDL 41 05/22/2014 1625   CHOLHDL 4.5 05/22/2014 1625   VLDL 40 05/22/2014 1625   LDLCALC 104* 05/22/2014 1625       Other studies Reviewed: Additional studies/ records that were reviewed today include: previous notes.   ASSESSMENT AND PLAN:  1.  Lower ext edema - add metolazone 2.5 mg daily for 3 days 30 min prior to lasix, then go to every other day, will check BMP today and BNP, follow up with APP in 2 weeks.  Have recommended support stockings once the edema improves.   2. HTN with her diastolic HF would do better with BP <295 systolic.  I have asked her to resume her coreg.     Current medicines are reviewed with the patient today.  The patient Has no concerns regarding medicines.  The following changes have  been made:  See above Labs/ tests ordered today include:see above  Disposition:   FU:  see above  Lennie Muckle, NP  06/07/2015 8:36 AM    [facility]  [facility], [place], [place], [place] Phone: [phone]; Fax: [phone]

## 2015-06-07 NOTE — Telephone Encounter (Signed)
REFILL 

## 2015-06-07 NOTE — Patient Instructions (Addendum)
Your physician has recommended you make the following change in your medication: start new prescription written for metolazone 2.5 mg tablets as directed on the bottle this has already been sent to your pharmacy.  Your physician recommends that you return for lab work today or tomorrow.  Your physician recommends that you schedule a follow-up appointment in: 2 weeks with a extender. Compression Stockings Compression stockings are elastic stockings that "compress" your legs. This helps to increase blood flow, decrease swelling, and reduces the chance of getting blood clots in your lower legs. Compression stockings are used:  After surgery.  If you have a history of poor circulation.  If you are prone to blood clots.  If you have varicose veins.  If you sit or are bedridden for long periods of time. WEARING COMPRESSION STOCKINGS  Your compression stockings should be worn as instructed by your caregiver.  Wearing the correct stocking size is important. Your caregiver can help measure and fit you to the correct size.  When wearing your stockings, do not allow the stockings to bunch up. This is especially important around your toes or behind your knees. Keep the stockings as smooth as possible.  Do not roll the stockings downward and leave them rolled down. This can form a restrictive band around your legs and can decrease blood flow.  The stockings should be removed once a day for 1 hour or as instructed by your caregiver. When the stockings are taken off, inspect your legs and feet. Look for:  Open sores.  Red spots.  Puffy areas (swelling).  Anything that does not seem normal. IMPORTANT INFORMATION ABOUT COMPRESSION STOCKINGS  The compression stockings should be clean, dry, and in good condition before you put them on.  Do not put lotion on your legs or feet. This makes it harder to put the stockings on.  Change your stockings immediately if they become wet or soiled.  Do  not wear stockings that are ripped or torn.  You may hand-wash or put your stockings in the washing machine. Use cold or warm water with mild detergent. Do not bleach your stockings. They may be air-dried or dried in the dryer on low heat.  If you have pain or have a feeling of "pins and needles" in your feet or legs, you may be wearing stockings that are too tight. Call your caregiver right away. SEEK IMMEDIATE MEDICAL CARE IF:   You have numbness or tingling in your lower legs that does not get better quickly after the stockings are removed.  Your toes or feet become cold and blue.  You develop open sores or have red spots on your legs that do not go away. MAKE SURE YOU:   Understand these instructions.  Will watch your condition.  Will get help right away if you are not doing well or get worse. Document Released: 07/02/2009 Document Revised: 11/27/2011 Document Reviewed: 07/02/2009 [facility] Patient Information 2015 [place], [place]. This information is not intended to replace advice given to you by your health care provider. Make sure you discuss any questions you have with your health care provider.   Resume you coreg

## 2015-06-10 ENCOUNTER — Other Ambulatory Visit: Payer: Self-pay | Admitting: *Deleted

## 2015-06-10 NOTE — Patient Outreach (Signed)
Call to patient for [facility] program screening. No answer, left VM with RNCM contact requesting return call. Plan await call back and if none will make another outreach call. Royetta Crochet. Laymond Purser, RN, BSN, [place] [phone]

## 2015-06-11 ENCOUNTER — Telehealth: Payer: Self-pay | Admitting: Hematology and Oncology

## 2015-06-11 ENCOUNTER — Telehealth: Payer: Self-pay | Admitting: *Deleted

## 2015-06-11 NOTE — Telephone Encounter (Signed)
Patient called stating that she is having left breast pain that has been ongoing for a month or so. Patient denies lumps or any other symptoms. Patient would like to get an appointment with [place] Bunnie Philips NP for observation. Patient forwarded to scheduler for an appointment.

## 2015-06-11 NOTE — Telephone Encounter (Signed)
pt called to sched appt....done....pt is ok and aware of new d.t

## 2015-06-15 ENCOUNTER — Other Ambulatory Visit: Payer: Self-pay | Admitting: *Deleted

## 2015-06-15 NOTE — Patient Outreach (Signed)
Call to patient home for [facility] program screening-third attempt No answer, left Voicemail with RNCM contact. Plan to send unsuccessful outreach letter if no response will close screening. Royetta Crochet. Laymond Purser, RN, BSN, [place] [phone]

## 2015-06-18 ENCOUNTER — Other Ambulatory Visit: Payer: Self-pay

## 2015-06-21 ENCOUNTER — Ambulatory Visit: Payer: Self-pay | Admitting: Physician Assistant

## 2015-06-22 ENCOUNTER — Telehealth: Payer: Self-pay

## 2015-06-22 ENCOUNTER — Encounter: Payer: Self-pay | Admitting: Hematology and Oncology

## 2015-06-22 ENCOUNTER — Ambulatory Visit: Payer: Self-pay | Admitting: Hematology and Oncology

## 2015-06-22 NOTE — Telephone Encounter (Signed)
Ms. Alexandria [name] and left a message to cancel her appointment today at 1330 with Dr. Alvy Bimler.  The problem resolved that she was coming in to have evaluated.

## 2015-06-29 ENCOUNTER — Encounter: Payer: Self-pay | Admitting: *Deleted

## 2015-06-29 NOTE — Patient Outreach (Signed)
[facility]) Care Management  06/29/2015  KONA YUSUF [birth year] [id number]   No return call from outreach letter. Will send case closure letter to doctor and have [place] close screening. Royetta Crochet. Laymond Purser, RN, BSN, [place] [phone]

## 2015-06-30 ENCOUNTER — Telehealth: Payer: Self-pay | Admitting: Internal Medicine

## 2015-06-30 ENCOUNTER — Other Ambulatory Visit: Payer: Self-pay | Admitting: Internal Medicine

## 2015-06-30 MED ORDER — ATORVASTATIN CALCIUM 20 MG PO TABS: 20.0000 mg | ORAL_TABLET | Freq: Every day | ORAL | Status: DC

## 2015-06-30 MED ORDER — LORAZEPAM 0.5 MG PO TABS: 0.5000 mg | ORAL_TABLET | Freq: Three times a day (TID) | ORAL | Status: DC | PRN

## 2015-06-30 NOTE — Telephone Encounter (Signed)
Rxs done. 

## 2015-06-30 NOTE — Telephone Encounter (Signed)
Pt needs lipitor #90 and lorazepam #270 for 90 day supply send into [facility]

## 2015-07-01 NOTE — Patient Outreach (Signed)
[facility]) Care Management  07/01/2015  Alexandria [name] [birth year] [id number]   Notification from Burgess Amor, RN to close case due to unable to contact patient for [facility] services.  Thanks, [name]. [place], [place] Assistant Phone: [phone] Fax: [phone]

## 2015-07-02 ENCOUNTER — Inpatient Hospital Stay: Admission: RE | Admit: 2015-07-02 | Payer: Self-pay | Source: Ambulatory Visit

## 2015-07-06 ENCOUNTER — Telehealth: Payer: Self-pay | Admitting: Pulmonary Disease

## 2015-07-06 NOTE — Telephone Encounter (Signed)
Called spoke w/ pt. She is going to keep all 3 appts. She does not want to cancel. Will sign off.

## 2015-07-07 ENCOUNTER — Ambulatory Visit: Payer: Self-pay | Admitting: Pulmonary Disease

## 2015-07-07 ENCOUNTER — Telehealth: Payer: Self-pay | Admitting: *Deleted

## 2015-07-07 ENCOUNTER — Ambulatory Visit (INDEPENDENT_AMBULATORY_CARE_PROVIDER_SITE_OTHER): Payer: Medicare Other | Admitting: Pulmonary Disease

## 2015-07-07 ENCOUNTER — Ambulatory Visit (INDEPENDENT_AMBULATORY_CARE_PROVIDER_SITE_OTHER): Payer: 59 | Admitting: Pulmonary Disease

## 2015-07-07 DIAGNOSIS — R0600 Dyspnea, unspecified: Secondary | ICD-10-CM | POA: Diagnosis not present

## 2015-07-07 LAB — PULMONARY FUNCTION TEST
DL/VA % PRED: 97 %
DL/VA: 4.79 ml/min/mmHg/L
DLCO UNC % PRED: 75 %
DLCO UNC: 19.25 ml/min/mmHg
FEF 25-75 POST: 1.99 L/s
FEF 25-75 PRE: 2.08 L/s
FEF2575-%Change-Post: -4 %
FEF2575-%PRED-POST: 86 %
FEF2575-%Pred-Pre: 90 %
FEV1-%CHANGE-POST: -1 %
FEV1-%Pred-Post: 79 %
FEV1-%Pred-Pre: 79 %
FEV1-POST: 2.04 L
FEV1-Pre: 2.06 L
FEV1FVC-%CHANGE-POST: 2 %
FEV1FVC-%Pred-Pre: 103 %
FEV6-%Change-Post: -4 %
FEV6-%Pred-Post: 75 %
FEV6-%Pred-Pre: 78 %
FEV6-PRE: 2.55 L
FEV6-Post: 2.43 L
FEV6FVC-%CHANGE-POST: 0 %
FEV6FVC-%PRED-POST: 104 %
FEV6FVC-%PRED-PRE: 103 %
FVC-%CHANGE-POST: -3 %
FVC-%PRED-POST: 73 %
FVC-%Pred-Pre: 76 %
FVC-PRE: 2.55 L
FVC-Post: 2.46 L
POST FEV1/FVC RATIO: 83 %
PRE FEV6/FVC RATIO: 100 %
Post FEV6/FVC ratio: 100 %
Pre FEV1/FVC ratio: 81 %

## 2015-07-07 NOTE — Progress Notes (Signed)
PFT 07/07/15: FVC 2.55 L (76%) FEV1 2.06 L (70%) FEV1/FVC 0.81 FEF 25-75 2.08 L (90%) no bronchodilator response TLC 4.33 L (83%) RV 86% ERV 30% DLCO uncorrected 75%  6MWT 07/07/15:  Walked 264 meters / Baseline Sat 94% on RA / Nadir Sat 92% on RA

## 2015-07-07 NOTE — Telephone Encounter (Signed)
I spoke with patient about results and she verbalized understanding and had no questions 

## 2015-07-07 NOTE — Progress Notes (Signed)
PFT done today. 

## 2015-07-07 NOTE — Telephone Encounter (Signed)
Please let her know that her walk test showed no significant desaturation that would indicate an oxygen requirement & her PFTs were normal without any sign of COPD. Therefore an albuterol inhaler is not going to benefit her. JN

## 2015-07-07 NOTE — Telephone Encounter (Signed)
Pt had to cancel appt this afternoon d/t being in a lot of pain from her knee's and all over her body. She wants her breathing test results/ 6MW test results. Also pt is requesting an albuterol inhaler be sent to the pharmacy. Please advise thanks

## 2015-07-08 ENCOUNTER — Ambulatory Visit: Payer: Self-pay | Admitting: Pulmonary Disease

## 2015-07-08 ENCOUNTER — Inpatient Hospital Stay: Admission: RE | Admit: 2015-07-08 | Payer: Self-pay | Source: Ambulatory Visit

## 2015-07-08 ENCOUNTER — Ambulatory Visit: Payer: Self-pay

## 2015-07-09 ENCOUNTER — Ambulatory Visit
Admission: RE | Admit: 2015-07-09 | Discharge: 2015-07-09 | Disposition: A | Discharge status: Home / Self Care | Payer: Medicare Other | Source: Ambulatory Visit | Attending: Hematology and Oncology | Admitting: Hematology and Oncology

## 2015-07-09 DIAGNOSIS — C50411 Malignant neoplasm of upper-outer quadrant of right female breast: Secondary | ICD-10-CM

## 2015-07-12 ENCOUNTER — Encounter: Payer: Self-pay | Admitting: Physician Assistant

## 2015-07-12 ENCOUNTER — Other Ambulatory Visit: Payer: Self-pay | Admitting: Physician Assistant

## 2015-07-12 ENCOUNTER — Ambulatory Visit (INDEPENDENT_AMBULATORY_CARE_PROVIDER_SITE_OTHER): Payer: 59 | Admitting: Physician Assistant

## 2015-07-12 VITALS — BP 132/82 | HR 90 | Ht 65.4 in | Wt 229.8 lb

## 2015-07-12 DIAGNOSIS — I10 Essential (primary) hypertension: Secondary | ICD-10-CM | POA: Diagnosis not present

## 2015-07-12 DIAGNOSIS — I5033 Acute on chronic diastolic (congestive) heart failure: Secondary | ICD-10-CM

## 2015-07-12 MED ORDER — METOLAZONE 2.5 MG PO TABS: ORAL_TABLET | ORAL | Status: DC

## 2015-07-12 MED ORDER — FUROSEMIDE 80 MG PO TABS: 80.0000 mg | ORAL_TABLET | Freq: Two times a day (BID) | ORAL | Status: DC

## 2015-07-12 NOTE — Patient Instructions (Signed)
Take Metolazone 2.5 mg every other day  Take Lasix 80 mg twice a day  Take Coreg 3.125 mg twice a day  Continue weigh daily  Lab work ( bmet ) today  Your physician recommends that you schedule a follow-up appointment in: first available with Dr.Berry.

## 2015-07-12 NOTE — Progress Notes (Signed)
Cardiology Office Note   Date:  07/12/2015   ID:  Alexandria, [name] 1951-02-02, MRN [id number]  PCP:  Nyoka Cowden, MD  Cardiologist:  Dr Stacy Gardner, PA-C   Chief Complaint  Patient presents with  . Follow-up    2 wk//pt c/o swelling in legs/feet/ankles, taking lasix and metolazone and that is helping  . Shortness of Breath    on exertion    History of Present Illness: Alexandria [name] is a 64 y.o. female with a history of LE edema, breast CA w/ chemo/XRT/lumpectomy, nl EF w/ DD on echo, GERD  Alexandria [name] presents for followup of diastolic CHF and LE edema.   She is improving from an edema/volume standpoint. She recently passed pulmonary testing and has been able to increase her activity. She struggles with bad knees but is doing water-based exercise which is easier. She is losing weight and has a positive attitude about the changes she is making.  DOE is improving, LE edema is improving. She has continued the higher doses of Lasix plus QOD metolazone. She is tolerating the medications well.   Past Medical History  Diagnosis Date  . Iron deficiency anemia   . Status post chemotherapy     4 cycles of Taxotere and cytoxan  . Chronic back pain     "all the way up and down"  . Hx of pulmonary embolus     During c-Section  . Peripheral neuropathic pain   . Obesity     Class 2  . S/P radiation therapy  03/04/2013-04/17/2013    1) Right breast / 50 Gy in 25 fractions/ 2) Right breast boost / 10 Gy in 5 fractions  . Diastolic heart failure   . Hx of radiation therapy 03/04/13- 04/17/13    right breast 50 Gy 25 fractions, right breast boost 10 Gy 5 fractions  . Anxiety   . H/O hiatal hernia   . 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 GAD (generalized anxiety disorder)   . 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 Louretta Shorten  . Lymphedema     bilateral lower extremity  . Complication of anesthesia     "I come out really anxious; need Ativan to ease me out" (09/07/2014)  . Hypertension   . Diastolic dysfunction   . Pneumonia     "at least twice/yr" (09/07/2014)  . Chronic bronchitis     "get it q yr" (09/07/2014)  . On home oxygen therapy     "2L just at night" (09/07/2014)  . History of blood transfusion "3-4"    "never can tell why; don't know where the blood was coming from; doesn't show up in stool or urine"  . Daily headache     "recently" (09/07/2014)  . DDD (degenerative disc disease), cervical   . DDD (degenerative disc disease), lumbosacral   . DDD (degenerative disc disease), thoracolumbar   . Fibromyalgia   . Hyperlipidemia     Past Surgical History  Procedure Laterality Date  . Cesarean section  4098; 1978; 1980;     "stillborn in 69"  . Partial colectomy N/A 07/04/2014    Procedure: REPAIR OF INCARCERATED INCISIONAL HERNIA WITH MESH;  Surgeon: Excell Seltzer, MD;  Location: [facility];  Service: General;  Laterality: N/A;  . Tonsillectomy    . Appendectomy    .  Breast biopsy Right   . Breast lumpectomy Right   . Inguinal hernia repair Bilateral     "cancer"  . Umbilical hernia repair  X 2  . Dilation and curettage of uterus  "numerous"  . Abdominal hysterectomy    . Foot surgery Right ~ 2013 X 3    "put pins in but pins kept breaking"    Current Outpatient Prescriptions  Medication Sig Dispense Refill  . anastrozole (ARIMIDEX) 1 MG tablet Take 1 tablet (1 mg total) by mouth daily. 90 tablet 3  . atorvastatin (LIPITOR) 20 MG tablet TAKE 1 TABLET BY MOUTH DAILY 90 tablet 1  . carvedilol (COREG) 3.125 MG tablet TAKE 1 TABLET BY MOUTH TWICE DAILY WITH A MEAL 180 tablet 2  . cyclobenzaprine (FLEXERIL) 5 MG tablet Take 1 tablet by mouth as needed.    . docusate sodium 100 MG CAPS Take 100 mg by mouth 2 (two) times daily. 10 capsule 0  . escitalopram (LEXAPRO) 10 MG tablet TAKE 1 TABLET BY MOUTH  TWICE DAILY 180 tablet 3  . furosemide (LASIX) 80 MG tablet Take 1 tablet (80 mg total) by mouth daily. 90 tablet 1  . furosemide (LASIX) 80 MG tablet TAKE 1 TABLET BY MOUTH TWICE DAILY, MAY TAKE AN EXTRA TABLET IF NEEDED FOR INCREASED SWELLING OR RAPID WEIGHT GAIN 270 tablet 3  . lidocaine (LIDODERM) 5 % Place 1-2 patches onto the skin daily. Remove & Discard patch within 12 hours or as directed by MD    . lidocaine-prilocaine (EMLA) cream Apply 1 application topically as needed (port access). 90 g 1  . lidocaine-prilocaine (EMLA) cream Apply 1 application topically as needed. 30 g 6  . Linaclotide (LINZESS) 290 MCG CAPS capsule Take 1 capsule (290 mcg total) by mouth daily. 90 capsule 1  . lisinopril (PRINIVIL,ZESTRIL) 5 MG tablet TAKE 1 TABLET BY MOUTH DAILY 30 tablet 5  . LORazepam (ATIVAN) 0.5 MG tablet Take 1 tablet (0.5 mg total) by mouth every 8 (eight) hours as needed. for anxiety 270 tablet 0  . metolazone (ZAROXOLYN) 2.5 MG tablet SEE NOTES 54 tablet 3  . Multiple Vitamins-Minerals (HAIR/SKIN/NAILS PO) Take 1 tablet by mouth every morning.    Marland Kitchen oxyCODONE-acetaminophen (PERCOCET) 10-325 MG per tablet Take 1 tablet by mouth as needed.    Marland Kitchen oxymorphone (OPANA) 10 MG tablet Take 1 tablet by mouth as needed.  0  . pantoprazole (PROTONIX) 40 MG tablet Take 1 tablet by mouth as needed.    . potassium chloride SA (K-DUR,KLOR-CON) 20 MEQ tablet Take 1 tablet (20 mEq total) by mouth 3 (three) times daily. (Patient taking differently: Take 20 mEq by mouth 2 (two) times daily. ) 270 tablet 1  . ranitidine (ZANTAC) 150 MG tablet Take 1 tablet (150 mg total) by mouth at bedtime. 30 tablet 3   No current facility-administered medications for this visit.    Allergies:   Contrast media; Albuterol; and Prednisone    Social History:  The patient  reports that she has been passively smoking.  She has never used smokeless tobacco. She reports that she does not drink alcohol or use illicit drugs.    Family History:  The patient's family history includes COPD in her maternal grandfather, maternal grandmother, and sister; Emphysema in her maternal grandfather; Parkinson's disease in her mother. There is no history of CAD.    ROS:  Please see the history of present illness. All other systems are reviewed and negative.    PHYSICAL EXAM: VS:  BP 132/82 mmHg  Pulse 90  Ht 5' 5.4" (1.661 m)  Wt 229 lb 12 oz (104.214 kg)  BMI 37.77 kg/m2  LMP 01/30/2005 , BMI Body mass index is 37.77 kg/(m^2). GEN: Well nourished, well developed, female in no acute distress HEENT: normal for age  Neck: no JVD, no carotid bruit, no masses Cardiac: RRR; no murmur, no rubs, or gallops Respiratory:  clear to auscultation bilaterally, normal work of breathing GI: soft, nontender, nondistended, + BS MS: no deformity or atrophy; no edema; distal pulses are 2+ in all 4 extremities  Skin: warm and dry, no rash Neuro:  Strength and sensation are intact Psych: euthymic mood, full affect   EKG:  EKG is not ordered today.  Recent Labs: 09/07/2014: Pro B Natriuretic peptide (BNP) 304.7* 12/04/2014: ALT 31 12/05/2014: B Natriuretic Peptide 25.5 12/06/2014: TSH 0.043* 03/26/2015: BUN 21.1; Creatinine 1.0; HGB 12.7; Platelets 183; Potassium 4.3; Sodium 139    Lipid Panel    Component Value Date/Time   CHOL 185 05/22/2014 1625   TRIG 200* 05/22/2014 1625   HDL 41 05/22/2014 1625   CHOLHDL 4.5 05/22/2014 1625   VLDL 40 05/22/2014 1625   LDLCALC 104* 05/22/2014 1625     Wt Readings from Last 3 Encounters:  07/12/15 229 lb 12 oz (104.214 kg)  06/07/15 251 lb (113.853 kg)  05/31/15 251 lb 12.8 oz (114.216 kg)     Other studies Reviewed: Additional studies/ records that were reviewed today include: office notes, hospital records, echo.  ASSESSMENT AND PLAN:  1.  Chronic Diastolic CHF: We will continue the Lasix at 80 mg bid with metolazone qod. Check BMET today to make sure her electrolytes and renal  function are tolerating diuresis well.  Current medicines are reviewed at length with the patient today.  The patient does not have concerns regarding medicines.  The following changes have been made:  no change  Labs/ tests ordered today include:   Orders Placed This Encounter  Procedures  . Basic metabolic panel    Disposition:   FU with Dr Gwenlyn Found   Signed, Lenoard Aden  07/12/2015 4:54 PM    [facility] [address], [place], [place]  [address] Phone: [phone]; Fax: [phone]

## 2015-07-13 ENCOUNTER — Other Ambulatory Visit: Payer: Self-pay | Admitting: Radiology

## 2015-07-15 ENCOUNTER — Ambulatory Visit (HOSPITAL_COMMUNITY)
Admission: RE | Admit: 2015-07-15 | Discharge: 2015-07-15 | Disposition: A | Discharge status: Home / Self Care | Payer: 59 | Source: Ambulatory Visit | Attending: Hematology and Oncology | Admitting: Hematology and Oncology

## 2015-07-15 ENCOUNTER — Other Ambulatory Visit: Payer: Self-pay | Admitting: Radiology

## 2015-07-15 ENCOUNTER — Encounter (HOSPITAL_COMMUNITY): Payer: Self-pay

## 2015-07-15 DIAGNOSIS — Z452 Encounter for adjustment and management of vascular access device: Secondary | ICD-10-CM | POA: Diagnosis present

## 2015-07-15 DIAGNOSIS — F411 Generalized anxiety disorder: Secondary | ICD-10-CM | POA: Diagnosis not present

## 2015-07-15 DIAGNOSIS — Z9981 Dependence on supplemental oxygen: Secondary | ICD-10-CM | POA: Diagnosis not present

## 2015-07-15 DIAGNOSIS — E669 Obesity, unspecified: Secondary | ICD-10-CM | POA: Insufficient documentation

## 2015-07-15 DIAGNOSIS — Z923 Personal history of irradiation: Secondary | ICD-10-CM | POA: Insufficient documentation

## 2015-07-15 DIAGNOSIS — Z853 Personal history of malignant neoplasm of breast: Secondary | ICD-10-CM | POA: Diagnosis not present

## 2015-07-15 DIAGNOSIS — I110 Hypertensive heart disease with heart failure: Secondary | ICD-10-CM | POA: Insufficient documentation

## 2015-07-15 DIAGNOSIS — M549 Dorsalgia, unspecified: Secondary | ICD-10-CM | POA: Insufficient documentation

## 2015-07-15 DIAGNOSIS — C50419 Malignant neoplasm of upper-outer quadrant of unspecified female breast: Secondary | ICD-10-CM | POA: Insufficient documentation

## 2015-07-15 DIAGNOSIS — Z7722 Contact with and (suspected) exposure to environmental tobacco smoke (acute) (chronic): Secondary | ICD-10-CM | POA: Insufficient documentation

## 2015-07-15 DIAGNOSIS — G8929 Other chronic pain: Secondary | ICD-10-CM | POA: Insufficient documentation

## 2015-07-15 DIAGNOSIS — D509 Iron deficiency anemia, unspecified: Secondary | ICD-10-CM | POA: Diagnosis not present

## 2015-07-15 DIAGNOSIS — E785 Hyperlipidemia, unspecified: Secondary | ICD-10-CM | POA: Diagnosis not present

## 2015-07-15 DIAGNOSIS — M797 Fibromyalgia: Secondary | ICD-10-CM | POA: Diagnosis not present

## 2015-07-15 DIAGNOSIS — Z86711 Personal history of pulmonary embolism: Secondary | ICD-10-CM | POA: Insufficient documentation

## 2015-07-15 DIAGNOSIS — Z6837 Body mass index (BMI) 37.0-37.9, adult: Secondary | ICD-10-CM | POA: Insufficient documentation

## 2015-07-15 DIAGNOSIS — I5032 Chronic diastolic (congestive) heart failure: Secondary | ICD-10-CM | POA: Insufficient documentation

## 2015-07-15 LAB — CBC
HEMATOCRIT: 36.1 % (ref 36.0–46.0)
Hemoglobin: 11.3 g/dL — ABNORMAL LOW (ref 12.0–15.0)
MCH: 26.4 pg (ref 26.0–34.0)
MCHC: 31.3 g/dL (ref 30.0–36.0)
MCV: 84.3 fL (ref 78.0–100.0)
PLATELETS: 251 10*3/uL (ref 150–400)
RBC: 4.28 MIL/uL (ref 3.87–5.11)
RDW: 15.7 % — AB (ref 11.5–15.5)
WBC: 6.6 10*3/uL (ref 4.0–10.5)

## 2015-07-15 LAB — BASIC METABOLIC PANEL
Anion gap: 10 (ref 5–15)
BUN: 30 mg/dL — AB (ref 6–20)
CHLORIDE: 100 mmol/L — AB (ref 101–111)
CO2: 28 mmol/L (ref 22–32)
CREATININE: 1.14 mg/dL — AB (ref 0.44–1.00)
Calcium: 8.8 mg/dL — ABNORMAL LOW (ref 8.9–10.3)
GFR calc Af Amer: 58 mL/min — ABNORMAL LOW (ref >60)
GFR calc non Af Amer: 50 mL/min — ABNORMAL LOW (ref >60)
GLUCOSE: 128 mg/dL — AB (ref 65–99)
POTASSIUM: 3.7 mmol/L (ref 3.5–5.1)
Sodium: 138 mmol/L (ref 135–145)

## 2015-07-15 LAB — APTT: aPTT: 35 seconds (ref 24–37)

## 2015-07-15 LAB — PROTIME-INR
INR: 0.98 (ref 0.00–1.49)
Prothrombin Time: 13.2 seconds (ref 11.6–15.2)

## 2015-07-15 MED ORDER — MIDAZOLAM HCL 2 MG/2ML IJ SOLN
INTRAMUSCULAR | Status: AC
  Filled 2015-07-15: qty 6

## 2015-07-15 MED ORDER — LIDOCAINE-EPINEPHRINE 2 %-1:100000 IJ SOLN
INTRAMUSCULAR | Status: AC
  Filled 2015-07-15: qty 1

## 2015-07-15 MED ORDER — LIDOCAINE HCL 1 % IJ SOLN
INTRAMUSCULAR | Status: AC
  Filled 2015-07-15: qty 20

## 2015-07-15 MED ORDER — CEFAZOLIN SODIUM-DEXTROSE 2-3 GM-% IV SOLR
INTRAVENOUS | Status: AC
  Filled 2015-07-15: qty 50

## 2015-07-15 MED ORDER — CEFAZOLIN SODIUM-DEXTROSE 2-3 GM-% IV SOLR
2.0000 g | INTRAVENOUS | Status: AC
  Administered 2015-07-15: 2 g via INTRAVENOUS

## 2015-07-15 MED ORDER — MIDAZOLAM HCL 2 MG/2ML IJ SOLN
INTRAMUSCULAR | Status: AC | PRN
  Administered 2015-07-15 (×5): 1 mg via INTRAVENOUS

## 2015-07-15 MED ORDER — FENTANYL CITRATE (PF) 100 MCG/2ML IJ SOLN
INTRAMUSCULAR | Status: AC
  Filled 2015-07-15: qty 4

## 2015-07-15 MED ORDER — FENTANYL CITRATE (PF) 100 MCG/2ML IJ SOLN
INTRAMUSCULAR | Status: AC | PRN
  Administered 2015-07-15: 50 ug via INTRAVENOUS
  Administered 2015-07-15: 25 ug via INTRAVENOUS

## 2015-07-15 MED ORDER — SODIUM CHLORIDE 0.9 % IV SOLN
Freq: Once | INTRAVENOUS | Status: AC
  Administered 2015-07-15: 13:00:00 via INTRAVENOUS

## 2015-07-15 NOTE — H&P (Signed)
Chief Complaint: Patient was seen in consultation today for port removal at the request of [place]  Referring Physician(s): Gorsuch,Ni  History of Present Illness: Alexandria [name] is a 64 y.o. female with history of right breast cancer s/p treatment with no evidence of disease. She has been seen by Dr. Alvy Bimler and IR received request for left sided port removal that is no longer needed. The patient states her port was placed in [place] approximately 3 years ago. She denies any chest pain, shortness of breath or palpitations. She denies any active signs of bleeding or excessive bruising. She denies any recent fever or chills. The patient denies any history of sleep apnea or chronic oxygen use. She has previously tolerated sedation without complications.    Past Medical History  Diagnosis Date  . Iron deficiency anemia   . Status post chemotherapy     4 cycles of Taxotere and cytoxan  . Chronic back pain     "all the way up and down"  . Hx of pulmonary embolus     During c-Section  . Peripheral neuropathic pain ([place])   . Obesity     Class 2  . S/P radiation therapy  03/04/2013-04/17/2013    1) Right breast / 50 Gy in 25 fractions/ 2) Right breast boost / 10 Gy in 5 fractions  . Diastolic heart failure ([place])   . Hx of radiation therapy 03/04/13- 04/17/13    right breast 50 Gy 25 fractions, right breast boost 10 Gy 5 fractions  . Anxiety   . H/O hiatal hernia   . Breast cancer ([place]) 10/02/12    Invasive Ductal Carcinoma of the Right Upper Outer Quadrant - ER (>90%), PR - Neg., Her2 Neu Negative, Ki-67 Unknown  . GAD (generalized anxiety disorder)   . Recurrent major depression ([place]) 06/2014    Seen by Dr. Louretta Shorten  . Lymphedema     bilateral lower extremity  . Complication of anesthesia     "I come out really anxious; need Ativan to ease me out" (09/07/2014)  . Hypertension   . Diastolic dysfunction   . Pneumonia     "at least twice/yr" (09/07/2014)  . Chronic bronchitis  ([place])     "get it q yr" (09/07/2014)  . On home oxygen therapy     "2L just at night" (09/07/2014)  . History of blood transfusion "3-4"    "never can tell why; don't know where the blood was coming from; doesn't show up in stool or urine"  . Daily headache     "recently" (09/07/2014)  . DDD (degenerative disc disease), cervical   . DDD (degenerative disc disease), lumbosacral   . DDD (degenerative disc disease), thoracolumbar   . Fibromyalgia   . Hyperlipidemia     Past Surgical History  Procedure Laterality Date  . Cesarean section  2353; 1978; 1980;     "stillborn in 44"  . Partial colectomy N/A 07/04/2014    Procedure: REPAIR OF INCARCERATED INCISIONAL HERNIA WITH MESH;  Surgeon: Excell Seltzer, [place];  Location: [facility];  Service: General;  Laterality: N/A;  . Tonsillectomy    . Appendectomy    . Breast biopsy Right   . Breast lumpectomy Right   . Inguinal hernia repair Bilateral     "cancer"  . Umbilical hernia repair  X 2  . Dilation and curettage of uterus  "numerous"  . Abdominal hysterectomy    . Foot surgery Right ~ 2013 X 3    "put  pins in but pins kept breaking"    Allergies: Contrast media; Albuterol; and Prednisone  Medications: Prior to Admission medications   Medication Sig Start Date End Date Taking? Authorizing Provider  anastrozole (ARIMIDEX) 1 MG tablet Take 1 tablet (1 mg total) by mouth daily. 03/26/15  Yes Heath Lark, MD  atorvastatin (LIPITOR) 20 MG tablet TAKE 1 TABLET BY MOUTH DAILY 07/01/15  Yes Marletta Lor, MD  carvedilol (COREG) 3.125 MG tablet TAKE 1 TABLET BY MOUTH TWICE DAILY WITH A MEAL 06/07/15  Yes Isaiah Serge, NP  cyclobenzaprine (FLEXERIL) 5 MG tablet Take 1 tablet by mouth as needed. 05/03/15  Yes Historical Provider, [place]  docusate sodium 100 MG CAPS Take 100 mg by mouth 2 (two) times daily. 07/10/14  Yes Nat Christen, PA-C  escitalopram (LEXAPRO) 10 MG tablet TAKE 1 TABLET BY MOUTH TWICE DAILY 03/26/15  Yes Heath Lark, MD    furosemide (LASIX) 80 MG tablet TAKE 1 TABLET BY MOUTH TWICE DAILY 07/13/15  Yes Rhonda G Barrett, PA-C  lidocaine (LIDODERM) 5 % Place 1-2 patches onto the skin daily. Remove & Discard patch within 12 hours or as directed by MD    Historical Provider, MD  lidocaine-prilocaine (EMLA) cream Apply 1 application topically as needed (port access). 01/01/15   Marletta Lor, MD  lidocaine-prilocaine (EMLA) cream Apply 1 application topically as needed. 03/26/15   Heath Lark, MD  Linaclotide (LINZESS) 290 MCG CAPS capsule Take 1 capsule (290 mcg total) by mouth daily. 01/01/15   Marletta Lor, MD  lisinopril (PRINIVIL,ZESTRIL) 5 MG tablet TAKE 1 TABLET BY MOUTH DAILY 02/04/15   Marletta Lor, MD  LORazepam (ATIVAN) 0.5 MG tablet Take 1 tablet (0.5 mg total) by mouth every 8 (eight) hours as needed. for anxiety 06/30/15   Marletta Lor, MD  metolazone (ZAROXOLYN) 2.5 MG tablet Take 2.5 mg every other day 30 min before am Lasix 07/12/15   Rhonda G Barrett, PA-C  Multiple Vitamins-Minerals (HAIR/SKIN/NAILS PO) Take 1 tablet by mouth every morning.    Historical Provider, MD  oxyCODONE-acetaminophen (PERCOCET) 10-325 MG per tablet Take 1 tablet by mouth as needed. 05/03/15   Historical Provider, MD  pantoprazole (PROTONIX) 40 MG tablet Take 1 tablet by mouth as needed. 03/10/15   Historical Provider, MD  potassium chloride SA (K-DUR,KLOR-CON) 20 MEQ tablet Take 1 tablet (20 mEq total) by mouth 3 (three) times daily. Patient taking differently: Take 20 mEq by mouth 2 (two) times daily.  03/23/15   Marletta Lor, MD  ranitidine (ZANTAC) 150 MG tablet Take 1 tablet (150 mg total) by mouth at bedtime. 05/31/15   Javier Glazier, MD     Family History  Problem Relation Age of Onset  . Parkinson's disease Mother   . Emphysema Maternal Grandfather   . COPD Maternal Grandfather   . COPD Sister   . CAD Neg Hx   . COPD Maternal Grandmother     Social History   Social History  .  Marital Status: Married    Spouse Name: N/A  . Number of Children: N/A  . Years of Education: N/A   Occupational History  . retired Therapist, sports    Social History Main Topics  . Smoking status: Passive Smoke Exposure - Never Smoker  . Smokeless tobacco: Never Used     Comment: Through father  . Alcohol Use: No  . Drug Use: No  . Sexual Activity: Not Currently   Other Topics Concern  . None  Social History Narrative   Originally from [place], [place]. Previously lived in [place]. [place] to [facility] in 2014. Previously worked as an Warden/ranger. No known TB exposure. No pets currently. No bird exposure. Minimal mold exposure.     Review of Systems: A 12 point ROS discussed and pertinent positives are indicated in the HPI above.  All other systems are negative.  Review of Systems  Vital Signs: BP 105/74 mmHg  Pulse 100  Temp(Src) 98 F (36.7 C) (Oral)  Resp 18  Ht 5' 5"  (1.651 m)  Wt 224 lb 1.6 oz (101.651 kg)  BMI 37.29 kg/m2  SpO2 95%  LMP 01/30/2005  Physical Exam  Constitutional: She is oriented to person, place, and time. No distress.  HENT:  Head: Normocephalic and atraumatic.  Neck: No tracheal deviation present.  Cardiovascular: Normal rate and regular rhythm.  Exam reveals no gallop and no friction rub.   No murmur heard. Pulmonary/Chest: Effort normal and breath sounds normal. No respiratory distress. She has no wheezes. She has no rales.  Neurological: She is alert and oriented to person, place, and time.  Skin: Skin is warm and dry. She is not diaphoretic.  Left sided port intact    Mallampati Score:  MD Evaluation Airway: WNL Heart: WNL Abdomen: WNL Chest/ Lungs: WNL ASA  Classification: 3 Mallampati/Airway Score: Two  Imaging: Dg Bone Density  07/09/2015  EXAM: DUAL X-RAY ABSORPTIOMETRY (DXA) FOR BONE MINERAL DENSITY IMPRESSION: Patient: Alexandria [name] Referring Physician: Heath Lark Birth Date: Dec 17, 1950 [age] Patient ID: [id number] Height: 65.0 in.  Weight: 229.0 lbs. Measured: 07/09/2015 11:31:23 AM (16 SP 2) Sex: Female Ethnicity: White Analyzed: 07/09/2015 11:40:21 AM (16 SP 2) FRAX* 10-year Probability of Fracture Based on femoral neck BMD: DualFemur (Left) Major Osteoporotic Fracture: 7.8% Hip Fracture:                0.7% Population:                  [place] (Caucasian) Risk Factors:                Secondary Osteoporosis *FRAX is a [place] [employer] for Metabolic Bone Disease, a [facility] (WHO) [facility]. Referring Physician:  NI GORSUCH PATIENT: Name: Alexandria [name], Alexandria [name] Patient ID: [id number] Birth Date: March 08, 1951 Height: 65.0 in. Sex: Female Measured: 07/09/2015 Weight: 229.0 lbs. Indications: Anastrazole, Breast Cancer History, Caucasian, Estrogen Deficient, Height Loss (781.91), Hysterectomy, Postmenopausal, Secondary Osteoporosis Fractures: Treatments: Calcium (E943.0), Vitamin D (E933.5) ASSESSMENT: The BMD measured at Femur Neck Left is 0.827 g/cm2 with a T-score of -1.5. This patient is considered to be OSTEOPENIC according to [facility]) criteria. Lumbar spine was not utilized due to advanced degenerative changes. Site Region Measured Date Measured Age WHO YA BMD Classification T-score DualFemur Neck Left 07/09/2015 [age] Osteopenia -1.5 0.827 g/cm2 Left Forearm Radius 33% 07/09/2015 64.4 Osteopenia -1.3 0.774 g/cm2 World Health Organization [facility]) criteria for post-menopausal, Caucasian Women: Normal       T-score at or above -1 SD Osteopenia   T-score between -1 and -2.5 SD Osteoporosis T-score at or below -2.5 SD RECOMMENDATION: [place] recommends that FDA-approved medical therapies be considered in postmenopausal women and men age 46 or older with a: 1. Hip or vertebral (clinical or morphometric) fracture. 2. T-score of <-2.5 at the spine or hip. 3. Ten-year fracture probability by FRAX of 3% or greater for hip fracture  or 20% or  greater for major osteoporotic fracture. All treatment decisions require clinical judgment and consideration of individual patient factors, including patient preferences, co-morbidities, previous drug use, risk factors not captured in the FRAX model (e.g. falls, vitamin D deficiency, increased bone turnover, interval significant decline in bone density) and possible under - or over-estimation of fracture risk by FRAX. All patients should ensure an adequate intake of dietary calcium (1200 mg/d) and vitamin D (800 IU daily) unless contraindicated. FOLLOW-UP: People with diagnosed cases of osteoporosis or at high risk for fracture should have regular bone mineral density tests. For patients eligible for Medicare, routine testing is allowed once every 2 years. The testing frequency can be increased to one year for patients who have rapidly progressing disease, those who are receiving or discontinuing medical therapy to restore bone mass, or have additional risk factors. I have reviewed this report, and agree with the above findings. Mark A. Thornton Papas, M.D. [facility] FRAX* 10-year Probability of Fracture Based on femoral neck BMD: DualFemur (Left) Major Osteoporotic Fracture: 7.8% Hip Fracture:                0.7% Population:                  [place] (Caucasian) Risk Factors:                Secondary Osteoporosis *FRAX is a Materials engineer of the [employer] for Metabolic Bone Disease, a [place] (WHO) [facility]. ASSESSMENT: The probability of a major osteoporitic fracture is 7.8 % within the next ten years. The probability of a hip fracture is 0.7 % within the next ten years. I have reviewed this report, and agree with the above findings. Mark A. Thornton Papas, M.D. [facility] Electronically Signed   By: Lavonia Dana M.D.   On: 07/09/2015 20:45    Labs:  CBC:  Recent Labs  12/06/14 0450 12/07/14 0719 03/26/15 1421 07/15/15 1310  WBC 3.7* 3.3* 4.2  6.6  HGB 13.2 13.0 12.7 11.3*  HCT 40.2 38.8 38.4 36.1  PLT 153 138* 183 251    COAGS:  Recent Labs  10/01/14 1927 10/02/14 0730 12/05/14 0314  INR 1.02 1.05 1.12    BMP:  Recent Labs  12/05/14 0917 12/06/14 0450 12/07/14 0719 12/08/14 0520 03/26/15 1457  NA 133* 140 138 137 139  K 3.5 3.5 3.2* 3.2* 4.3  CL 100 105 104 100  --   CO2 26 29 28 27 29   GLUCOSE 119* 110* 99 155* 115  BUN 22 12 8 9  21.1  CALCIUM 8.7 8.9 8.9 8.8 9.6  CREATININE 0.99 0.82 0.87 0.91 1.0  GFRNONAA 60* 76* 70* 67*  --   GFRAA 70* 88* 82* 77*  --     LIVER FUNCTION TESTS:  Recent Labs  09/07/14 2245 10/02/14 0006 10/02/14 0730 12/04/14 1815  BILITOT 0.3 0.6 0.6 0.6  AST 29 22 24 30   ALT 29 21 22 31   ALKPHOS 188* 150* 131* 159*  PROT 6.6 6.6 6.1 7.0  ALBUMIN 3.4* 3.2* 3.0* 3.6    Assessment and Plan: History of right breast cancer finished treatment with no evidence of disease Seen by Dr. Alvy Bimler, request for left sided port removal with sedation that is no longer needed Port placed in [place] approximately 3 years ago per patient  Chronic Diastolic CHF, at her baseline per patient, seems euvolemic today  The patient has been NPO, no blood thinners taken, labs and  vitals have been reviewed. Risks and Benefits discussed with the patient including, but not limited to bleeding, infection or pneumothorax All of the patient's questions were answered, patient is agreeable to proceed. Consent signed and in chart.    Thank you for this interesting consult.  I greatly enjoyed meeting Alexandria [name] and look forward to participating in their care.  A copy of this report was sent to the requesting provider on this date.  SignedHedy Jacob 07/15/2015, 1:39 PM   I spent a total of 15 Minutes in face to face in clinical consultation, greater than 50% of which was counseling/coordinating care for breast cancer

## 2015-07-15 NOTE — Discharge Instructions (Signed)
Incision Care An incision is when a surgeon cuts into your body. After surgery, the incision needs to be cared for properly to prevent infection.  HOW TO CARE FOR YOUR INCISION  Take medicines only as directed by your health care provider.  There are many different ways to close and cover an incision, including stitches, skin glue, and adhesive strips. Follow your health care provider's instructions on:  Incision care.  Bandage (dressing) changes and removal.  Incision closure removal.  Do not take baths, swim, or use a hot tub until your health care provider approves. You may shower as directed by your health care provider.  Resume your normal diet and activities as directed.  Use anti-itch medicine (such as an antihistamine) as directed by your health care provider. The incision may itch while it is healing. Do not pick or scratch at the incision.  Drink enough fluid to keep your urine clear or pale yellow. SEEK MEDICAL CARE IF:   You have drainage, redness, swelling, or pain at your incision site.  You have muscle aches, chills, or a general ill feeling.  You notice a bad smell coming from the incision or dressing.  Your incision edges separate after the sutures, staples, or skin adhesive strips have been removed.  You have persistent nausea or vomiting.  You have a fever.  You are dizzy. SEEK IMMEDIATE MEDICAL CARE IF:   You have a rash.  You faint.  You have difficulty breathing. MAKE SURE YOU:   Understand these instructions.  Will watch your condition.  Will get help right away if you are not doing well or get worse.   This information is not intended to replace advice given to you by your health care provider. Make sure you discuss any questions you have with your health care provider.   Moderate Conscious Sedation, Adult, Care After Refer to this sheet in the next few weeks. These instructions provide you with information on caring for yourself after  your procedure. Your health care provider may also give you more specific instructions. Your treatment has been planned according to current medical practices, but problems sometimes occur. Call your health care provider if you have any problems or questions after your procedure. WHAT TO EXPECT AFTER THE PROCEDURE  After your procedure:  You may feel sleepy, clumsy, and have poor balance for several hours.  Vomiting may occur if you eat too soon after the procedure. HOME CARE INSTRUCTIONS  Do not participate in any activities where you could become injured for at least 24 hours. Do not:  Drive.  Swim.  Ride a bicycle.  Operate heavy machinery.  Cook.  Use power tools.  Climb ladders.  Work from a high place.  Do not make important decisions or sign legal documents until you are improved.  If you vomit, drink water, juice, or soup when you can drink without vomiting. Make sure you have little or no nausea before eating solid foods.  Only take over-the-counter or prescription medicines for pain, discomfort, or fever as directed by your health care provider.  Make sure you and your family fully understand everything about the medicines given to you, including what side effects may occur.  You should not drink alcohol, take sleeping pills, or take medicines that cause drowsiness for at least 24 hours.  If you smoke, do not smoke without supervision.  If you are feeling better, you may resume normal activities 24 hours after you were sedated.  Keep all appointments with your  health care provider. SEEK MEDICAL CARE IF:  Your skin is pale or bluish in color.  You continue to feel nauseous or vomit.  Your pain is getting worse and is not helped by medicine.  You have bleeding or swelling.  You are still sleepy or feeling clumsy after 24 hours. SEEK IMMEDIATE MEDICAL CARE IF:  You develop a rash.  You have difficulty breathing.  You develop any type of allergic  problem.  You have a fever. MAKE SURE YOU:  Understand these instructions.  Will watch your condition.  Will get help right away if you are not doing well or get worse.   This information is not intended to replace advice given to you by your health care provider. Make sure you discuss any questions you have with your health care provider.   Document Released: 06/25/2013 Document Revised: 09/25/2014 Document Reviewed: 06/25/2013 Elsevier Interactive Patient Education [employer].

## 2015-07-15 NOTE — Procedures (Signed)
Successful LT CHEST PORT REMOVAL NO COMP STABLE FULL REPORT IN PACS

## 2015-08-04 ENCOUNTER — Ambulatory Visit: Payer: Self-pay | Admitting: Adult Health

## 2015-08-06 ENCOUNTER — Other Ambulatory Visit: Payer: Self-pay | Admitting: Hematology and Oncology

## 2015-08-25 ENCOUNTER — Telehealth: Payer: Self-pay | Admitting: Internal Medicine

## 2015-08-25 MED ORDER — LUBIPROSTONE 24 MCG PO CAPS: 24.0000 ug | ORAL_CAPSULE | Freq: Two times a day (BID) | ORAL | Status: DC

## 2015-08-25 NOTE — Telephone Encounter (Signed)
Please advise of okay to send Rx?

## 2015-08-25 NOTE — Telephone Encounter (Signed)
Pt said she has a coupon and would like the below med called in  [facility] 62mcg 2 times a day    [place]  [phone]

## 2015-08-25 NOTE — Telephone Encounter (Signed)
Spoke to Alexandria [name], told her Dr. Raliegh Ip said okay for Rx but to start with once daily dose. Alexandria [name] verbalized understanding and stated she has been on the medication and taking twice a day was getting samples from [place] her okay will change to twice a day and let Dr.K know. Alexandria [name] verbalized understanding. Rx sent to pharmacy.

## 2015-08-25 NOTE — Telephone Encounter (Signed)
Ok;  recommended to start with a once daily dose

## 2015-08-31 ENCOUNTER — Emergency Department (HOSPITAL_COMMUNITY): Payer: Medicare Other

## 2015-08-31 ENCOUNTER — Encounter (HOSPITAL_COMMUNITY): Payer: Self-pay

## 2015-08-31 ENCOUNTER — Emergency Department (HOSPITAL_COMMUNITY)
Admission: EM | Admit: 2015-08-31 | Discharge: 2015-08-31 | Disposition: A | Discharge status: Home / Self Care | Payer: Medicare Other | Attending: Emergency Medicine | Admitting: Emergency Medicine

## 2015-08-31 DIAGNOSIS — Z9981 Dependence on supplemental oxygen: Secondary | ICD-10-CM | POA: Insufficient documentation

## 2015-08-31 DIAGNOSIS — E785 Hyperlipidemia, unspecified: Secondary | ICD-10-CM | POA: Insufficient documentation

## 2015-08-31 DIAGNOSIS — Z8701 Personal history of pneumonia (recurrent): Secondary | ICD-10-CM | POA: Insufficient documentation

## 2015-08-31 DIAGNOSIS — I10 Essential (primary) hypertension: Secondary | ICD-10-CM | POA: Diagnosis not present

## 2015-08-31 DIAGNOSIS — Z853 Personal history of malignant neoplasm of breast: Secondary | ICD-10-CM | POA: Insufficient documentation

## 2015-08-31 DIAGNOSIS — F419 Anxiety disorder, unspecified: Secondary | ICD-10-CM | POA: Insufficient documentation

## 2015-08-31 DIAGNOSIS — G8929 Other chronic pain: Secondary | ICD-10-CM | POA: Insufficient documentation

## 2015-08-31 DIAGNOSIS — M797 Fibromyalgia: Secondary | ICD-10-CM | POA: Insufficient documentation

## 2015-08-31 DIAGNOSIS — Y9389 Activity, other specified: Secondary | ICD-10-CM | POA: Diagnosis not present

## 2015-08-31 DIAGNOSIS — Z86711 Personal history of pulmonary embolism: Secondary | ICD-10-CM | POA: Insufficient documentation

## 2015-08-31 DIAGNOSIS — E876 Hypokalemia: Secondary | ICD-10-CM | POA: Insufficient documentation

## 2015-08-31 DIAGNOSIS — Y9289 Other specified places as the place of occurrence of the external cause: Secondary | ICD-10-CM | POA: Diagnosis not present

## 2015-08-31 DIAGNOSIS — Z79899 Other long term (current) drug therapy: Secondary | ICD-10-CM | POA: Diagnosis not present

## 2015-08-31 DIAGNOSIS — R0789 Other chest pain: Secondary | ICD-10-CM | POA: Insufficient documentation

## 2015-08-31 DIAGNOSIS — Y998 Other external cause status: Secondary | ICD-10-CM | POA: Insufficient documentation

## 2015-08-31 DIAGNOSIS — F329 Major depressive disorder, single episode, unspecified: Secondary | ICD-10-CM | POA: Insufficient documentation

## 2015-08-31 DIAGNOSIS — D649 Anemia, unspecified: Secondary | ICD-10-CM | POA: Insufficient documentation

## 2015-08-31 DIAGNOSIS — Z79891 Long term (current) use of opiate analgesic: Secondary | ICD-10-CM | POA: Insufficient documentation

## 2015-08-31 DIAGNOSIS — R000 Tachycardia, unspecified: Secondary | ICD-10-CM | POA: Diagnosis not present

## 2015-08-31 DIAGNOSIS — X58XXXA Exposure to other specified factors, initial encounter: Secondary | ICD-10-CM | POA: Insufficient documentation

## 2015-08-31 DIAGNOSIS — S99911A Unspecified injury of right ankle, initial encounter: Secondary | ICD-10-CM | POA: Diagnosis not present

## 2015-08-31 DIAGNOSIS — I5030 Unspecified diastolic (congestive) heart failure: Secondary | ICD-10-CM | POA: Insufficient documentation

## 2015-08-31 DIAGNOSIS — E669 Obesity, unspecified: Secondary | ICD-10-CM | POA: Insufficient documentation

## 2015-08-31 DIAGNOSIS — M25571 Pain in right ankle and joints of right foot: Secondary | ICD-10-CM

## 2015-08-31 LAB — CBC
HEMATOCRIT: 34.9 % — AB (ref 36.0–46.0)
HEMOGLOBIN: 10.9 g/dL — AB (ref 12.0–15.0)
MCH: 24 pg — ABNORMAL LOW (ref 26.0–34.0)
MCHC: 31.2 g/dL (ref 30.0–36.0)
MCV: 76.9 fL — ABNORMAL LOW (ref 78.0–100.0)
Platelets: 351 10*3/uL (ref 150–400)
RBC: 4.54 MIL/uL (ref 3.87–5.11)
RDW: 16.7 % — ABNORMAL HIGH (ref 11.5–15.5)
WBC: 9.1 10*3/uL (ref 4.0–10.5)

## 2015-08-31 LAB — BASIC METABOLIC PANEL
ANION GAP: 13 (ref 5–15)
BUN: 15 mg/dL (ref 6–20)
CALCIUM: 9.5 mg/dL (ref 8.9–10.3)
CO2: 32 mmol/L (ref 22–32)
Chloride: 90 mmol/L — ABNORMAL LOW (ref 101–111)
Creatinine, Ser: 0.88 mg/dL (ref 0.44–1.00)
Glucose, Bld: 142 mg/dL — ABNORMAL HIGH (ref 65–99)
POTASSIUM: 2.7 mmol/L — AB (ref 3.5–5.1)
Sodium: 135 mmol/L (ref 135–145)

## 2015-08-31 LAB — I-STAT TROPONIN, ED: TROPONIN I, POC: 0.01 ng/mL (ref 0.00–0.08)

## 2015-08-31 MED ORDER — LORAZEPAM 2 MG/ML IJ SOLN
0.5000 mg | Freq: Once | INTRAMUSCULAR | Status: AC
  Administered 2015-08-31: 0.5 mg via INTRAVENOUS
  Filled 2015-08-31: qty 1

## 2015-08-31 MED ORDER — FENTANYL CITRATE (PF) 100 MCG/2ML IJ SOLN
50.0000 ug | Freq: Once | INTRAMUSCULAR | Status: AC
  Administered 2015-08-31: 50 ug via INTRAVENOUS
  Filled 2015-08-31: qty 2

## 2015-08-31 MED ORDER — METHYLPREDNISOLONE SODIUM SUCC 125 MG IJ SOLR
125.0000 mg | Freq: Once | INTRAMUSCULAR | Status: AC
  Administered 2015-08-31: 125 mg via INTRAVENOUS
  Filled 2015-08-31: qty 2

## 2015-08-31 MED ORDER — SODIUM CHLORIDE 0.9 % IV BOLUS (SEPSIS)
1000.0000 mL | Freq: Once | INTRAVENOUS | Status: AC
  Administered 2015-08-31: 1000 mL via INTRAVENOUS

## 2015-08-31 MED ORDER — POTASSIUM CHLORIDE CRYS ER 20 MEQ PO TBCR
80.0000 meq | EXTENDED_RELEASE_TABLET | Freq: Once | ORAL | Status: AC
  Administered 2015-08-31: 80 meq via ORAL
  Filled 2015-08-31: qty 4

## 2015-08-31 MED ORDER — OXYCODONE-ACETAMINOPHEN 10-325 MG PO TABS: 1.0000 | ORAL_TABLET | Freq: Four times a day (QID) | ORAL | Status: DC | PRN

## 2015-08-31 NOTE — ED Notes (Addendum)
Pt here with substernal chest pain onset last night and pain/tingling radiates down right arm. 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

## 2015-08-31 NOTE — ED Provider Notes (Signed)
CSN: [id number]     Arrival date & time 08/31/15  1540 History   First MD Initiated Contact with Patient 08/31/15 1653     Chief Complaint  Patient presents with  . Chest Pain     HPI  Patient presents with generalized discomfort, as well as focal chest and ankle pain. As chronic pain, post has developed worsening pain in the chest, right ankle. Chest pain has become more present over the past week, particularly over the past 24 hours. The pain is focally sternum, epigastrium, sore, severe. There is mild associated dyspnea. No relief with home medication. Patient had minor trauma to the ankle about 3 months ago, was seen by orthopedics, was diagnosed with soft tissue injury. 3 days ago, the patient suffered additional minor trauma to the ankle, notes that the pain has become worse since that time, focally in the medial aspect. Pain is sore, moderate. No distal loss of sensation. Patient also complains of anorexia, nausea, denies vomiting, diarrhea.    Past Medical History  Diagnosis Date  . Iron deficiency anemia   . Status post chemotherapy     4 cycles of Taxotere and cytoxan  . Chronic back pain     "all the way up and down"  . Hx of pulmonary embolus     During c-Section  . 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 Obesity     Class 2  . S/P radiation therapy  03/04/2013-04/17/2013    1) Right breast / 50 Gy in 25 fractions/ 2) Right breast boost / 10 Gy in 5 fractions  . Diastolic heart failure ([place])   . Hx of radiation therapy 03/04/13- 04/17/13    right breast 50 Gy 25 fractions, right breast boost 10 Gy 5 fractions  . Anxiety   . H/O hiatal hernia   . Breast cancer ([place]) 10/02/12    Invasive Ductal Carcinoma of the Right Upper Outer Quadrant - ER (>90%), PR - Neg., Her2 Neu Negative, Ki-67 Unknown  . GAD (generalized anxiety disorder)   . Recurrent major depression ([place]) 06/2014    Seen by Dr. Louretta Shorten  . Lymphedema     bilateral lower extremity  . Complication  of anesthesia     "I come out really anxious; need Ativan to ease me out" (09/07/2014)  . Hypertension   . Diastolic dysfunction   . Pneumonia     "at least twice/yr" (09/07/2014)  . Chronic bronchitis ([place])     "get it q yr" (09/07/2014)  . On home oxygen therapy     "2L just at night" (09/07/2014)  . History of blood transfusion "3-4"    "never can tell why; don't know where the blood was coming from; doesn't show up in stool or urine"  . Daily headache     "recently" (09/07/2014)  . DDD (degenerative disc disease), cervical   . DDD (degenerative disc disease), lumbosacral   . DDD (degenerative disc disease), thoracolumbar   . Fibromyalgia   . Hyperlipidemia    Past Surgical History  Procedure Laterality Date  . Cesarean section  8119; 1978; 1980;     "stillborn in 20"  . Partial colectomy N/A 07/04/2014    Procedure: REPAIR OF INCARCERATED INCISIONAL HERNIA WITH MESH;  Surgeon: Excell Seltzer, MD;  Location: [facility];  Service: General;  Laterality: N/A;  . Tonsillectomy    . Appendectomy    . Breast biopsy Right   . Breast lumpectomy Right   . Inguinal hernia repair Bilateral     "  cancer"  . Umbilical hernia repair  X 2  . Dilation and curettage of uterus  "numerous"  . Abdominal hysterectomy    . Foot surgery Right ~ 2013 X 3    "put pins in but pins kept breaking"   Family History  Problem Relation Age of Onset  . Parkinson's disease Mother   . Emphysema Maternal Grandfather   . COPD Maternal Grandfather   . COPD Sister   . CAD Neg Hx   . COPD Maternal Grandmother    Social History  Substance Use Topics  . Smoking status: Passive Smoke Exposure - Never Smoker  . Smokeless tobacco: Never Used     Comment: Through father  . Alcohol Use: No   OB History    No data available     Review of Systems  Constitutional:       Per HPI, otherwise negative  HENT:       Per HPI, otherwise negative  Respiratory:       Per HPI, otherwise negative   Cardiovascular:       Per HPI, otherwise negative  Gastrointestinal: Negative for vomiting.  Endocrine:       Negative aside from HPI  Genitourinary:       Neg aside from HPI   Musculoskeletal:       Per HPI, otherwise negative  Skin: Negative.   Neurological: Negative for syncope.      Allergies  Contrast media; Albuterol; and Prednisone  Home Medications   Prior to Admission medications   Medication Sig Start Date End Date Taking? Authorizing Provider  anastrozole (ARIMIDEX) 1 MG tablet Take 1 tablet (1 mg total) by mouth daily. 03/26/15   Heath Lark, MD  atorvastatin (LIPITOR) 20 MG tablet TAKE 1 TABLET BY MOUTH DAILY 07/01/15   Marletta Lor, MD  carvedilol (COREG) 3.125 MG tablet TAKE 1 TABLET BY MOUTH TWICE DAILY WITH A MEAL 06/07/15   Isaiah Serge, NP  cyclobenzaprine (FLEXERIL) 5 MG tablet Take 1 tablet by mouth as needed. 05/03/15   Historical Provider, MD  docusate sodium 100 MG CAPS Take 100 mg by mouth 2 (two) times daily. 07/10/14   Nat Christen, PA-C  escitalopram (LEXAPRO) 10 MG tablet TAKE 1 TABLET BY MOUTH TWICE DAILY 03/26/15   Heath Lark, MD  furosemide (LASIX) 80 MG tablet TAKE 1 TABLET BY MOUTH TWICE DAILY 07/13/15   Rhonda G Barrett, PA-C  lidocaine (LIDODERM) 5 % Place 1-2 patches onto the skin daily. Remove & Discard patch within 12 hours or as directed by MD    Historical Provider, MD  lidocaine-prilocaine (EMLA) cream Apply 1 application topically as needed (port access). 01/01/15   Marletta Lor, MD  Linaclotide [facility]) 290 MCG CAPS capsule Take 1 capsule (290 mcg total) by mouth daily. 01/01/15   Marletta Lor, MD  lisinopril (PRINIVIL,ZESTRIL) 5 MG tablet TAKE 1 TABLET BY MOUTH DAILY 02/04/15   Marletta Lor, MD  LORazepam (ATIVAN) 0.5 MG tablet Take 1 tablet (0.5 mg total) by mouth every 8 (eight) hours as needed. for anxiety 06/30/15   Marletta Lor, MD  lubiprostone (AMITIZA) 24 MCG capsule Take 1 capsule (24 mcg total) by  mouth 2 (two) times daily with a meal. 08/25/15   Marletta Lor, MD  metolazone (ZAROXOLYN) 2.5 MG tablet Take 2.5 mg every other day 30 min before am Lasix 07/12/15   Rhonda G Barrett, PA-C  Multiple Vitamins-Minerals (HAIR/SKIN/NAILS PO) Take 1 tablet by mouth every  morning.    Historical Provider, MD  oxyCODONE-acetaminophen (PERCOCET) 10-325 MG per tablet Take 2 tablets by mouth every 8 (eight) hours as needed for pain.  05/03/15   Historical Provider, MD  oxymorphone (OPANA ER) 30 MG 12 hr tablet Take 30 mg by mouth every 12 (twelve) hours.    Historical Provider, MD  oxymorphone (OPANA) 10 MG tablet Take 10 mg by mouth 5 (five) times daily.    Historical Provider, MD  pantoprazole (PROTONIX) 40 MG tablet Take 1 tablet by mouth daily as needed.  03/10/15   Historical Provider, MD  potassium chloride SA (K-DUR,KLOR-CON) 20 MEQ tablet Take 1 tablet (20 mEq total) by mouth 3 (three) times daily. Patient taking differently: Take 80 mEq by mouth 2 (two) times daily.  03/23/15   Marletta Lor, MD  ranitidine (ZANTAC) 150 MG tablet Take 1 tablet (150 mg total) by mouth at bedtime. 05/31/15   Javier Glazier, MD   BP 129/76 mmHg  Pulse 114  Temp(Src) 98.3 F (36.8 C) (Oral)  Resp 20  Ht 5' 6"  (1.676 m)  Wt 242 lb (109.77 kg)  BMI 39.08 kg/m2  SpO2 94%  LMP 01/30/2005 Physical Exam  Constitutional: She is oriented to person, place, and time. She appears well-developed and well-nourished. No distress.  HENT:  Head: Normocephalic and atraumatic.  Eyes: Conjunctivae and EOM are normal.  Cardiovascular: Normal rate and regular rhythm.   Pulmonary/Chest: Effort normal and breath sounds normal. No stridor. No respiratory distress.  Abdominal: She exhibits no distension.  Musculoskeletal: She exhibits edema.       Right knee: Normal.       Right ankle: She exhibits swelling. Tenderness. Medial malleolus tenderness found.  Neurological: She is alert and oriented to person, place, and  time. No cranial nerve deficit.  Skin: Skin is warm and dry.  Psychiatric: She has a normal mood and affect.  Nursing note and vitals reviewed.   ED Course  Procedures (including critical care time) Labs Review Labs Reviewed  BASIC METABOLIC PANEL - Abnormal; Notable for the following:    Potassium 2.7 (*)    Chloride 90 (*)    Glucose, Bld 142 (*)    All other components within normal limits  CBC - Abnormal; Notable for the following:    Hemoglobin 10.9 (*)    HCT 34.9 (*)    MCV 76.9 (*)    MCH 24.0 (*)    RDW 16.7 (*)    All other components within normal limits  I-STAT TROPOININ, ED    Imaging Review Dg Chest 2 View  08/31/2015  CLINICAL DATA:  Substernal chest pain beginning last night with pain and tingling radiating down RIGHT arm, history hiatal hernia, pulmonary embolism, RIGHT breast cancer post radiation therapy EXAM: CHEST  2 VIEW COMPARISON:  12/04/2014 FINDINGS: Upper normal heart size. Large hiatal hernia. Pulmonary vascular congestion. Mediastinal contours otherwise normal. Bronchitic changes with bibasilar atelectasis. No acute infiltrate, pleural effusion or pneumothorax. No acute osseous findings. IMPRESSION: Large hiatal hernia. Bronchitic changes with bibasilar atelectasis. Electronically Signed   By: Lavonia Dana M.D.   On: 08/31/2015 16:30   I have personally reviewed and evaluated these images and lab results as part of my medical decision-making.   EKG Interpretation   Date/Time:  Tuesday August 31 2015 15:53:51 EST Ventricular Rate:  135 PR Interval:  186 QRS Duration: 94 QT Interval:  390 QTC Calculation: 585 R Axis:   27 Text Interpretation:   Sinus tachycardia  Abnormal ECG agree. no  sig change from previous Confirmed by Johnney Killian, MD, Jeannie Done [id number]) on  08/31/2015 3:54:28 PM     I reviewed all labs with patient, including hypokalemia, anemia. Patient states that she has had anemia in the past, is not currently taking any of her vitamin  supplement.  On cardiac monitor, patient has heart rate in the 120 range, abnormal Pulse oxygen 99% normal Patient received IV fluid, potassium, analgesia.  7:16 PM On repeat exam the patient is in no distress. I discussed all findings again at length. Patient will follow-up with primary care for repeat lab evaluation in less than 1 week.  MDM  Patient presents with multiple concerns, has multiple medical problems.  Nontender, the patient is awake, alert, hemodynamically stable aside from mild tachycardia. No evidence for ongoing coronary ischemia, bacteremia or sepsis. Patient does have evidence for hypokalemia, as well as anemia, likely contributory to her tachycardia. With no ongoing CP, and with improved condition here, she was started on therapy, d/c to f/u as an outpatient.  Carmin Muskrat, MD 08/31/15 [phone]

## 2015-08-31 NOTE — ED Notes (Signed)
Lab called to report K+ 2.7.  Called Dr. Eulis Foster and advised, no orders.

## 2015-08-31 NOTE — Discharge Instructions (Signed)
As discussed, today's findings are largely reassuring, but there is need to follow-up with your primary care physician, take all medication as discussed to ensure improvement in your potassium level, and anemia.    In addition, please be sure to follow up with orthopedist for further evaluation, management of your ongoing ankle pain.  Return here for concerning changes in your condition.

## 2015-09-06 ENCOUNTER — Telehealth: Payer: Self-pay

## 2015-09-06 MED ORDER — HYDROCODONE-HOMATROPINE 5-1.5 MG/5ML PO SYRP: 5.0000 mL | ORAL_SOLUTION | Freq: Four times a day (QID) | ORAL | Status: DC | PRN

## 2015-09-06 NOTE — Telephone Encounter (Signed)
Pt was the ED on 08/31/15 do to the bronchitis. Pt has a bad cough and would like you to prescribe her HYDROMET.    [facility] Night - Client [facility] Patient Name: Alexandria [name] Gender: Female DOB: 08/27/1951 Age: 64 Y 35 M 21 D Return Phone Number: [phone] (Primary) Address: City/State/Zip: [place] Client [facility] Night - Client Client Site [facility] - Night Physician Simonne Martinet Contact Type Call Call Type Triage / Clinical Relationship To Patient Self Return Phone Number [phone] (Primary) Chief Complaint Cough Initial Comment *CBWN* Caller states in ED 2 nights ago; dx w/ bronchitis; has terrible cough; wants hydromet syrup called in; [facility] [id number] PreDisposition Call Doctor Nurse Assessment Nurse: Alvis Lemmings, RN, Marcie Bal Date/Time Eilene Ghazi Time): 09/04/2015 4:01:32 PM Confirm and document reason for call. If symptomatic, describe symptoms. ---Caller states in ED 2 nights ago; dx w/bronchitis; has terrible cough; wants hydromet syrup called in; [facility] [id number], coughing up green. Not sleeping. Low grade fever. Has the patient traveled out of the country within the last 30 days? ---No Does the patient have any new or worsening symptoms? ---Yes Will a triage be completed? ---Yes Related visit to physician within the last 2 weeks? ---Yes Does the PT have any chronic conditions? (i.e. diabetes, asthma, etc.) ---Yes List chronic conditions. ---hiatal hernia, chronic bronchitis, Is this a behavioral health or substance abuse call? ---No Guidelines Guideline Title Affirmed Question Affirmed Notes Nurse Date/Time (Eastern Time) Asthma Attack MILD asthma attack (e.g., no SOB at rest, mild SOB with walking, speaks normally in sentences, mild wheezing) (all triage questions negative) Alvis Lemmings, RN, Marcie Bal 09/04/2015 4:07:20 PM Disp. Time  Eilene Ghazi Time) Disposition Final User 09/04/2015 3:55:14 PM Send To Call Back Waiting For Nurse Dorene Sorrow 09/04/2015 4:13:46 PM Bell, RN, Marcie Bal PLEASE NOTE: All timestamps contained within this report are represented as [place] Standard Time. CONFIDENTIALTY NOTICE: This fax transmission is intended only for the addressee. It contains information that is legally privileged, confidential or otherwise protected from use or disclosure. If you are not the intended recipient, you are strictly prohibited from reviewing, disclosing, copying using or disseminating any of this information or taking any action in reliance on or regarding this information. If you have received this fax in error, please notify us immediately by telephone so that we can arrange for its return to [place]. Phone: [phone], Toll-Free: [phone], Fax: [phone] Page: 2 of 2 Call Id: [id number]

## 2015-09-06 NOTE — Telephone Encounter (Signed)
OK 6 OZ

## 2015-09-06 NOTE — Telephone Encounter (Signed)
Please advise if okay to refill Hydromet?

## 2015-09-06 NOTE — Telephone Encounter (Signed)
Pt notified Rx ready for pickup. Rx printed and signed.  

## 2015-09-30 ENCOUNTER — Telehealth: Payer: Self-pay | Admitting: Internal Medicine

## 2015-09-30 NOTE — Telephone Encounter (Signed)
Pt said when she was in hospital her blood count was down and would like to have it recheck. Can I sch?

## 2015-09-30 NOTE — Telephone Encounter (Signed)
Pt needs to schedule ED follow up with Dr.K and then he will decide what needs to be ordered.

## 2015-10-04 NOTE — Telephone Encounter (Signed)
lmom for pt to call back

## 2015-10-04 NOTE — Telephone Encounter (Signed)
Pt will call back to schedule

## 2015-10-06 DIAGNOSIS — M25569 Pain in unspecified knee: Secondary | ICD-10-CM | POA: Diagnosis not present

## 2015-10-06 DIAGNOSIS — M549 Dorsalgia, unspecified: Secondary | ICD-10-CM | POA: Diagnosis not present

## 2015-10-06 DIAGNOSIS — G894 Chronic pain syndrome: Secondary | ICD-10-CM | POA: Diagnosis not present

## 2015-10-06 DIAGNOSIS — Z79899 Other long term (current) drug therapy: Secondary | ICD-10-CM | POA: Diagnosis not present

## 2015-10-06 DIAGNOSIS — M792 Neuralgia and neuritis, unspecified: Secondary | ICD-10-CM | POA: Diagnosis not present

## 2015-10-22 ENCOUNTER — Telehealth: Payer: Self-pay | Admitting: Internal Medicine

## 2015-10-22 NOTE — Telephone Encounter (Signed)
ERROR/NJR

## 2015-11-02 DIAGNOSIS — D0471 Carcinoma in situ of skin of right lower limb, including hip: Secondary | ICD-10-CM | POA: Diagnosis not present

## 2015-11-02 DIAGNOSIS — D485 Neoplasm of uncertain behavior of skin: Secondary | ICD-10-CM | POA: Diagnosis not present

## 2015-11-02 DIAGNOSIS — L570 Actinic keratosis: Secondary | ICD-10-CM | POA: Diagnosis not present

## 2015-11-02 DIAGNOSIS — L821 Other seborrheic keratosis: Secondary | ICD-10-CM | POA: Diagnosis not present

## 2015-11-02 DIAGNOSIS — I872 Venous insufficiency (chronic) (peripheral): Secondary | ICD-10-CM | POA: Diagnosis not present

## 2015-11-02 DIAGNOSIS — I8312 Varicose veins of left lower extremity with inflammation: Secondary | ICD-10-CM | POA: Diagnosis not present

## 2015-11-03 DIAGNOSIS — M25579 Pain in unspecified ankle and joints of unspecified foot: Secondary | ICD-10-CM | POA: Diagnosis not present

## 2015-11-03 DIAGNOSIS — G894 Chronic pain syndrome: Secondary | ICD-10-CM | POA: Diagnosis not present

## 2015-11-03 DIAGNOSIS — M549 Dorsalgia, unspecified: Secondary | ICD-10-CM | POA: Diagnosis not present

## 2015-11-03 DIAGNOSIS — M792 Neuralgia and neuritis, unspecified: Secondary | ICD-10-CM | POA: Diagnosis not present

## 2015-11-03 DIAGNOSIS — Z79899 Other long term (current) drug therapy: Secondary | ICD-10-CM | POA: Diagnosis not present

## 2015-11-16 ENCOUNTER — Encounter: Payer: Self-pay | Admitting: Hematology and Oncology

## 2015-11-16 ENCOUNTER — Ambulatory Visit: Payer: Self-pay | Admitting: Hematology and Oncology

## 2015-11-16 ENCOUNTER — Other Ambulatory Visit: Payer: Self-pay

## 2015-11-17 ENCOUNTER — Telehealth: Payer: Self-pay | Admitting: *Deleted

## 2015-11-17 NOTE — Telephone Encounter (Signed)
She is multiple no-show I gave Tammi a letter yesterday. She is being discharged from the clinic. If she needs to be seen in the next 30 days, she will need to be scheduled with an NP only

## 2015-11-17 NOTE — Telephone Encounter (Signed)
No Show letter sent Certified Mail to pt's home address listed in EMR.

## 2015-11-17 NOTE — Telephone Encounter (Signed)
LVM for pt to return nurse's call regarding r/s appt..  See previous phone notes.  Pt has been d/c'd from clinic but we will have [facility] Level see pt if she needs to be seen in the next 30 days. POF sent for pt to see Mid level.  See letter from Dr. Alvy Bimler was placed Certified in outgoing mail this morning.

## 2015-11-17 NOTE — Telephone Encounter (Signed)
Patient called stating wanting to reschedule appointment.

## 2015-11-18 NOTE — Telephone Encounter (Signed)
Patient called "Returning missed call from scheduler.  Call transferred to [phone] at [phone].Marland Kitchen

## 2015-11-19 ENCOUNTER — Telehealth: Payer: Self-pay | Admitting: Oncology

## 2015-11-19 ENCOUNTER — Other Ambulatory Visit: Payer: Self-pay | Admitting: *Deleted

## 2015-11-19 ENCOUNTER — Telehealth: Payer: Self-pay | Admitting: *Deleted

## 2015-11-19 NOTE — Telephone Encounter (Signed)
Pt called back to r/s her missed appointment.  Informed pt Dr. Alvy Bimler wants to discharge pt from our clinic d/t too many missed appointments.  Pt was sent a warning letter last year and then a discharge letter this week.  Pt crying states she got the dates mixed up and didn't mean to miss her appointment.  Informed pt that Dr. Alvy Bimler will schedule her to see a NP or PA in our clinic within the next 30 days.  Informed pt order sent to scheduler and she should be getting a call from Scheduling to set up appt for lab and to see Mid level provider.  Pt verbalized understanding.

## 2015-11-19 NOTE — Telephone Encounter (Signed)
cld pt per staff message from [facility] and spoke to pt and gave pt time & date of appt for 3./6 @ 2:45-pt understood

## 2015-11-22 ENCOUNTER — Other Ambulatory Visit (HOSPITAL_BASED_OUTPATIENT_CLINIC_OR_DEPARTMENT_OTHER): Payer: Medicare Other

## 2015-11-22 ENCOUNTER — Encounter: Payer: Self-pay | Admitting: Oncology

## 2015-11-22 ENCOUNTER — Telehealth: Payer: Self-pay | Admitting: Hematology and Oncology

## 2015-11-22 ENCOUNTER — Other Ambulatory Visit: Payer: Self-pay

## 2015-11-22 ENCOUNTER — Ambulatory Visit (HOSPITAL_BASED_OUTPATIENT_CLINIC_OR_DEPARTMENT_OTHER): Payer: Medicare Other | Admitting: Oncology

## 2015-11-22 VITALS — BP 165/69 | HR 97 | Temp 98.6°F | Resp 17 | Ht 66.0 in | Wt 248.8 lb

## 2015-11-22 DIAGNOSIS — D509 Iron deficiency anemia, unspecified: Secondary | ICD-10-CM

## 2015-11-22 DIAGNOSIS — C50911 Malignant neoplasm of unspecified site of right female breast: Secondary | ICD-10-CM | POA: Diagnosis not present

## 2015-11-22 LAB — CBC & DIFF AND RETIC
BASO%: 0.2 % (ref 0.0–2.0)
BASOS ABS: 0 10*3/uL (ref 0.0–0.1)
EOS%: 3.9 % (ref 0.0–7.0)
Eosinophils Absolute: 0.2 10*3/uL (ref 0.0–0.5)
HEMATOCRIT: 29.5 % — AB (ref 34.8–46.6)
HEMOGLOBIN: 8.7 g/dL — AB (ref 11.6–15.9)
IMMATURE RETIC FRACT: 20.5 % — AB (ref 1.60–10.00)
LYMPH#: 1 10*3/uL (ref 0.9–3.3)
LYMPH%: 23.9 % (ref 14.0–49.7)
MCH: 19.5 pg — ABNORMAL LOW (ref 25.1–34.0)
MCHC: 29.5 g/dL — ABNORMAL LOW (ref 31.5–36.0)
MCV: 66.1 fL — ABNORMAL LOW (ref 79.5–101.0)
MONO#: 0.4 10*3/uL (ref 0.1–0.9)
MONO%: 8.6 % (ref 0.0–14.0)
NEUT#: 2.7 10*3/uL (ref 1.5–6.5)
NEUT%: 63.4 % (ref 38.4–76.8)
PLATELETS: 235 10*3/uL (ref 145–400)
RBC: 4.46 10*6/uL (ref 3.70–5.45)
RDW: 19.5 % — ABNORMAL HIGH (ref 11.2–14.5)
RETIC CT ABS: 61.1 10*3/uL (ref 33.70–90.70)
Retic %: 1.37 % (ref 0.70–2.10)
WBC: 4.3 10*3/uL (ref 3.9–10.3)

## 2015-11-22 LAB — FERRITIN: Ferritin: 7 ng/ml — ABNORMAL LOW (ref 9–269)

## 2015-11-22 MED ORDER — ESCITALOPRAM OXALATE 10 MG PO TABS: 10.0000 mg | ORAL_TABLET | Freq: Two times a day (BID) | ORAL | Status: DC

## 2015-11-22 MED ORDER — ANASTROZOLE 1 MG PO TABS: 1.0000 mg | ORAL_TABLET | Freq: Every day | ORAL | Status: DC

## 2015-11-22 NOTE — Assessment & Plan Note (Signed)
Hemoglobin is dropping with a low MCV.  Discussed with Dr. Alvy Bimler and will administer Feraheme 510 mg X2 doses.  Recheck iron studies are pending. Recommend recheck of labs with new oncologist in about 3 months.

## 2015-11-22 NOTE — Progress Notes (Signed)
[facility] OFFICE PROGRESS NOTE  Patient Care Team: Marletta Lor, MD as PCP - General (Internal Medicine)  SUMMARY OF ONCOLOGIC HISTORY:  #1 iron deficiency anemia, resolved #2 Estrogen receptor positive breast cancer, no evidence of disease #3 large hiatal hernia #4 symptomatic abdominal wall hernia  SUMMARY OF ONCOLOGIC HISTORY: #1 iron deficiency anemia She was found to have abnormal CBC from routine blood work as part of her treatment related to diagnosis of breast cancer. From June 2014 to present, she was noted to be progressively anemic to the point requiring blood transfusion in February 2015.  The patient had undergone extensive evaluation in [place] prior to moving to [place]. Unfortunately I do not have records of those. According to her, she had EGD and colonoscopy and had received multiple units of blood transfusion last year. According to the patient, the hemoglobin dropped to 3 g at some point in time. She had received blood transfusion twice. With the most recent blood transfusion, according to the patient, she had a transfusion reaction. According to the blood typing on 11/19/2013, warm antibody was detected. The patient was prescribed oral iron supplements and she takes it 3 times a day with an empty stomach. She received 1 dose of intravenous iron on 12/09/13 and again on 10/05/14. She has reduced her iron tablet down to one a day.  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 Malignant neoplasm of upper-outer quadrant of female breast   Primary site: Breast (Right)   Staging method: AJCC 7th Edition   Pathologic: Stage IA (T1, N0, cM0) signed by Deatra Robinson, MD on 03/26/2013  8:21 AM   Summary: Stage IA (T1, N0, cM0) In January 2014 she had a mammogram performed that showed an abnormality in the right breast. This was in [place]. 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 Patient went on to have a right lumpectomy performed revealing a T1cN0 disease stage at diagnosis stage I. She subsequently had Oncotype DX performed that showed a recurrence score of 23 giving her a 10 year risk for recurrence but 10-30% in the intermediate risk category. Because of this she received adjuvant chemotherapy consisting of 4 cycles of Taxotere and Cytoxan. She tolerated it well. She completed adjuvant radiation therapy from 03/04/2013 through 04/17/2013. She started taking Arimidex since 05/16/2013.  INTERVAL HISTORY: Please see below for problem oriented charting. She feels well, but tearful today since she has received a letter indicating that her primary oncologist is discharging her due to missed appointments. Has 30 days to find another oncologist.  She is morbidly obese and is attempting to lose weight. She has started exercise program. The patient denies any recent signs or symptoms of bleeding such as spontaneous epistaxis, hematuria or hematochezia. She denies any recent abnormal breast examination, palpable mass, abnormal breast appearance or nipple changes  REVIEW OF SYSTEMS:   Constitutional: Denies fevers, chills or abnormal weight loss Eyes: Denies blurriness of vision Ears, nose, mouth, throat, and face: Denies mucositis or sore throat Respiratory: Denies cough, dyspnea or wheezes Cardiovascular: Denies palpitation, chest discomfort or lower extremity swelling Gastrointestinal:  Denies nausea, heartburn or change in bowel habits Skin: Denies abnormal skin rashes Lymphatics: Denies new lymphadenopathy or easy bruising Neurological:Denies numbness, tingling or new weaknesses Behavioral/Psych:  Mood is stable, no new changes  All other systems were reviewed with the patient and are  negative.  I have reviewed the past medical history, past surgical history, social history and family history with the patient and they are unchanged from previous note.  ALLERGIES:  is allergic to contrast media; albuterol; and prednisone.  MEDICATIONS:  Current Outpatient Prescriptions  Medication Sig Dispense Refill  . anastrozole (ARIMIDEX) 1 MG tablet Take 1 tablet (1 mg total) by mouth daily. 90 tablet 0  . atorvastatin (LIPITOR) 20 MG tablet TAKE 1 TABLET BY MOUTH DAILY 90 tablet 1  . carvedilol (COREG) 3.125 MG tablet TAKE 1 TABLET BY MOUTH TWICE DAILY WITH A MEAL 180 tablet 2  . cyclobenzaprine (FLEXERIL) 10 MG tablet As directed  6  . docusate sodium 100 MG CAPS Take 100 mg by mouth 2 (two) times daily. (Patient taking differently: Take 100 mg by mouth daily as needed (mild constipation). ) 10 capsule 0  . escitalopram (LEXAPRO) 10 MG tablet Take 1 tablet (10 mg total) by mouth 2 (two) times daily. 180 tablet 0  . furosemide (LASIX) 80 MG tablet TAKE 1 TABLET BY MOUTH TWICE DAILY 180 tablet 0  . HYDROcodone-homatropine (HYCODAN) 5-1.5 MG/5ML syrup Take 5 mLs by mouth every 6 (six) hours as needed for cough. 120 mL 0  . Linaclotide (LINZESS) 290 MCG CAPS capsule Take 1 capsule (290 mcg total) by mouth daily. 90 capsule 1  . lisinopril (PRINIVIL,ZESTRIL) 5 MG tablet TAKE 1 TABLET BY MOUTH DAILY 30 tablet 5  . LORazepam (ATIVAN) 0.5 MG tablet Take 1 tablet (0.5 mg total) by mouth every 8 (eight) hours as needed. for anxiety 270 tablet 0  . lubiprostone (AMITIZA) 24 MCG capsule Take 1 capsule (24 mcg total) by mouth 2 (two) times daily with a meal. 60 capsule 2  . metolazone (ZAROXOLYN) 2.5 MG tablet Take 2.5 mg every other day 30 min before am Lasix 20 tablet 3  . Multiple Vitamins-Minerals (HAIR/SKIN/NAILS PO) Take 1 tablet by mouth every morning.    Marland Kitchen oxyCODONE-acetaminophen (PERCOCET) 10-325 MG tablet Take 1 tablet by mouth every 6 (six) hours as needed for pain. 15 tablet 0  .  oxymorphone (OPANA ER) 30 MG 12 hr tablet Take 1 tablet by mouth 3 (three) times daily.  0  . potassium chloride SA (K-DUR,KLOR-CON) 20 MEQ tablet Take 1 tablet (20 mEq total) by mouth 3 (three) times daily. (Patient taking differently: Take 80 mEq by mouth 3 (three) times daily. ) 270 tablet 1  . ranitidine (ZANTAC) 150 MG tablet Take 1 tablet (150 mg total) by mouth at bedtime. 30 tablet 3   No current facility-administered medications for this visit.    PHYSICAL EXAMINATION: ECOG PERFORMANCE STATUS: 1 - Symptomatic but completely ambulatory  Filed Vitals:   11/22/15 1459  BP: 165/69  Pulse: 97  Temp: 98.6 F (37 C)  Resp: 17   Filed Weights   11/22/15 1459  Weight: 248 lb 12.8 oz (112.855 kg)    GENERAL:alert, no distress and comfortable. She is morbidly obese SKIN: skin color, texture, turgor are normal, no rashes or significant lesions EYES: normal, Conjunctiva are pink and non-injected, sclera clear OROPHARYNX:no exudate, no erythema and lips, buccal mucosa, and tongue normal  NECK: supple, thyroid normal size, non-tender, without nodularity LYMPH:  no palpable lymphadenopathy in the cervical, axillary or inguinal LUNGS: clear to auscultation and percussion with normal breathing effort HEART: regular rate & rhythm and no murmurs and no lower extremity  edema ABDOMEN:abdomen soft, non-tender and normal bowel sounds Musculoskeletal:no cyanosis of digits and no clubbing  NEURO: alert & oriented x 3 with fluent speech, no focal motor/sensory deficits Breasts: Well-healed lumpectomy scar in the right breast, no other abnormalities  LABORATORY DATA:  I have reviewed the data as listed    Component Value Date/Time   NA 135 08/31/2015 1559   NA 139 03/26/2015 1457   K 2.7* 08/31/2015 1559   K 4.3 03/26/2015 1457   CL 90* 08/31/2015 1559   CL 99 03/04/2013 1239   CO2 32 08/31/2015 1559   CO2 29 03/26/2015 1457   GLUCOSE 142* 08/31/2015 1559   GLUCOSE 115 03/26/2015 1457    GLUCOSE 125* 03/04/2013 1239   BUN 15 08/31/2015 1559   BUN 21.1 03/26/2015 1457   CREATININE 0.88 08/31/2015 1559   CREATININE 1.0 03/26/2015 1457   CALCIUM 9.5 08/31/2015 1559   CALCIUM 9.6 03/26/2015 1457   PROT 7.0 12/04/2014 1815   PROT 7.7 01/01/2014 1400   ALBUMIN 3.6 12/04/2014 1815   ALBUMIN 3.7 01/01/2014 1400   AST 30 12/04/2014 1815   AST 32 01/01/2014 1400   ALT 31 12/04/2014 1815   ALT 29 01/01/2014 1400   ALKPHOS 159* 12/04/2014 1815   ALKPHOS 155* 01/01/2014 1400   BILITOT 0.6 12/04/2014 1815   BILITOT 0.25 01/01/2014 1400   GFRNONAA >60 08/31/2015 1559   GFRAA >60 08/31/2015 1559    No results found for: SPEP, UPEP  Lab Results  Component Value Date   WBC 4.3 11/22/2015   NEUTROABS 2.7 11/22/2015   HGB 8.7* 11/22/2015   HCT 29.5* 11/22/2015   MCV 66.1* 11/22/2015   PLT 235 11/22/2015      Chemistry      Component Value Date/Time   NA 135 08/31/2015 1559   NA 139 03/26/2015 1457   K 2.7* 08/31/2015 1559   K 4.3 03/26/2015 1457   CL 90* 08/31/2015 1559   CL 99 03/04/2013 1239   CO2 32 08/31/2015 1559   CO2 29 03/26/2015 1457   BUN 15 08/31/2015 1559   BUN 21.1 03/26/2015 1457   CREATININE 0.88 08/31/2015 1559   CREATININE 1.0 03/26/2015 1457      Component Value Date/Time   CALCIUM 9.5 08/31/2015 1559   CALCIUM 9.6 03/26/2015 1457   ALKPHOS 159* 12/04/2014 1815   ALKPHOS 155* 01/01/2014 1400   AST 30 12/04/2014 1815   AST 32 01/01/2014 1400   ALT 31 12/04/2014 1815   ALT 29 01/01/2014 1400   BILITOT 0.6 12/04/2014 1815   BILITOT 0.25 01/01/2014 1400     Recent mammogram was negative. ASSESSMENT & PLAN:  Breast cancer, right breast Clinically, she has no signs of disease recurrence. She will continue taking Arimidex until August 2019. Recent mammogram was negative. I have requested a repeat mammogram for May 2017. I refilled her prescription of anastrozole today. She will take Lexapro for management of night sweats/menopausal  symptoms. I have refilled this today. Recommend that she establish with Dr. Burney Gauze in [facility], but the patient indicates that it is too far to drive. Request made for patient to establish with one of the breast cancer doctors here in [place].  Iron deficiency anemia Hemoglobin is dropping with a low MCV.  Discussed with Dr. Alvy Bimler and will administer Feraheme 510 mg X2 doses.  Recheck iron studies are pending. Recommend recheck of labs with new oncologist in about 3 months.     Orders Placed This Encounter  Procedures  . MM DIAG BREAST TOMO BILATERAL    No prob/hx of rt breast cancer lumpectomy/no implants Pf 01/21/15 bcg/no needs Ins-uhc,mcr/np and shamieka/epic order    Standing Status: Future     Number of Occurrences:      Standing Expiration Date: 01/19/2017    Order Specific Question:  Reason for Exam (SYMPTOM  OR DIAGNOSIS REQUIRED)    Answer:  Breast cancer. Annual mammogram. S/P right lumpectomy.    Order Specific Question:  Preferred imaging location?    Answer:  [facility]  . CBC with Differential/Platelet    Standing Status: Future     Number of Occurrences:      Standing Expiration Date: 11/21/2016  . Ferritin    Standing Status: Future     Number of Occurrences:      Standing Expiration Date: 11/21/2016  . Reticulocytes ([place])    Standing Status: Future     Number of Occurrences:      Standing Expiration Date: 11/21/2016   Referral made to establish with new oncologist. All questions were answered. The patient knows to call the clinic with any problems, questions or concerns. No barriers to learning was detected. I spent 15 minutes counseling the patient face to face. The total time spent in the appointment was 20 minutes and more than 50% was on counseling and review of test results     Mikey Bussing, NP 11/22/2015 4:34 PM

## 2015-11-22 NOTE — Assessment & Plan Note (Signed)
Clinically, she has no signs of disease recurrence. She will continue taking Arimidex until August 2019. Recent mammogram was negative. I have requested a repeat mammogram for May 2017. I refilled her prescription of anastrozole today. She will take Lexapro for management of night sweats/menopausal symptoms. I have refilled this today. Recommend that she establish with Dr. Burney Gauze in [facility], but the patient indicates that it is too far to drive. Request made for patient to establish with one of the breast cancer doctors here in [place].

## 2015-11-22 NOTE — Telephone Encounter (Signed)
Gave and printed appt sched and avs for pt for March and May...emailed Dr. Lindi Adie

## 2015-11-24 ENCOUNTER — Ambulatory Visit (HOSPITAL_BASED_OUTPATIENT_CLINIC_OR_DEPARTMENT_OTHER): Payer: Medicare Other

## 2015-11-24 VITALS — BP 137/79 | HR 99 | Temp 98.3°F | Resp 18

## 2015-11-24 DIAGNOSIS — D509 Iron deficiency anemia, unspecified: Secondary | ICD-10-CM | POA: Diagnosis not present

## 2015-11-24 MED ORDER — SODIUM CHLORIDE 0.9 % IV SOLN
Freq: Once | INTRAVENOUS | Status: AC
  Administered 2015-11-24: 13:00:00 via INTRAVENOUS

## 2015-11-24 MED ORDER — SODIUM CHLORIDE 0.9 % IV SOLN
510.0000 mg | Freq: Once | INTRAVENOUS | Status: AC
  Administered 2015-11-24: 510 mg via INTRAVENOUS
  Filled 2015-11-24: qty 17

## 2015-11-24 NOTE — Patient Instructions (Signed)

## 2015-11-25 ENCOUNTER — Telehealth: Payer: Self-pay | Admitting: Hematology and Oncology

## 2015-11-25 NOTE — Telephone Encounter (Signed)
returned call and s.w. pt and confirmed that i had not recieved response from Dr. Lindi Adie for taking over her care.

## 2015-11-26 DIAGNOSIS — D0471 Carcinoma in situ of skin of right lower limb, including hip: Secondary | ICD-10-CM | POA: Diagnosis not present

## 2015-11-29 ENCOUNTER — Ambulatory Visit: Payer: Self-pay | Admitting: Oncology

## 2015-11-29 ENCOUNTER — Other Ambulatory Visit: Payer: Self-pay

## 2015-12-01 DIAGNOSIS — M549 Dorsalgia, unspecified: Secondary | ICD-10-CM | POA: Diagnosis not present

## 2015-12-01 DIAGNOSIS — M545 Low back pain: Secondary | ICD-10-CM | POA: Diagnosis not present

## 2015-12-01 DIAGNOSIS — Z79899 Other long term (current) drug therapy: Secondary | ICD-10-CM | POA: Diagnosis not present

## 2015-12-01 DIAGNOSIS — M25579 Pain in unspecified ankle and joints of unspecified foot: Secondary | ICD-10-CM | POA: Diagnosis not present

## 2015-12-01 DIAGNOSIS — G894 Chronic pain syndrome: Secondary | ICD-10-CM | POA: Diagnosis not present

## 2015-12-02 ENCOUNTER — Ambulatory Visit (HOSPITAL_BASED_OUTPATIENT_CLINIC_OR_DEPARTMENT_OTHER): Payer: Medicare Other

## 2015-12-02 VITALS — BP 137/80 | HR 87 | Resp 19

## 2015-12-02 DIAGNOSIS — D509 Iron deficiency anemia, unspecified: Secondary | ICD-10-CM | POA: Diagnosis not present

## 2015-12-02 MED ORDER — SODIUM CHLORIDE 0.9 % IV SOLN
Freq: Once | INTRAVENOUS | Status: AC
  Administered 2015-12-02: 14:00:00 via INTRAVENOUS

## 2015-12-02 MED ORDER — SODIUM CHLORIDE 0.9 % IV SOLN
510.0000 mg | Freq: Once | INTRAVENOUS | Status: AC
  Administered 2015-12-02: 510 mg via INTRAVENOUS
  Filled 2015-12-02: qty 17

## 2015-12-02 NOTE — Patient Instructions (Signed)

## 2015-12-22 DIAGNOSIS — Z79899 Other long term (current) drug therapy: Secondary | ICD-10-CM | POA: Diagnosis not present

## 2015-12-22 DIAGNOSIS — M549 Dorsalgia, unspecified: Secondary | ICD-10-CM | POA: Diagnosis not present

## 2015-12-22 DIAGNOSIS — G894 Chronic pain syndrome: Secondary | ICD-10-CM | POA: Diagnosis not present

## 2015-12-22 DIAGNOSIS — Z79891 Long term (current) use of opiate analgesic: Secondary | ICD-10-CM | POA: Diagnosis not present

## 2015-12-22 DIAGNOSIS — M25579 Pain in unspecified ankle and joints of unspecified foot: Secondary | ICD-10-CM | POA: Diagnosis not present

## 2015-12-22 DIAGNOSIS — M25569 Pain in unspecified knee: Secondary | ICD-10-CM | POA: Diagnosis not present

## 2015-12-28 ENCOUNTER — Other Ambulatory Visit: Payer: Self-pay | Admitting: Pain Medicine

## 2015-12-28 DIAGNOSIS — M545 Low back pain: Secondary | ICD-10-CM

## 2016-01-04 DIAGNOSIS — B9689 Other specified bacterial agents as the cause of diseases classified elsewhere: Secondary | ICD-10-CM | POA: Diagnosis not present

## 2016-01-04 DIAGNOSIS — I8312 Varicose veins of left lower extremity with inflammation: Secondary | ICD-10-CM | POA: Diagnosis not present

## 2016-01-04 DIAGNOSIS — I872 Venous insufficiency (chronic) (peripheral): Secondary | ICD-10-CM | POA: Diagnosis not present

## 2016-01-10 ENCOUNTER — Inpatient Hospital Stay: Admission: RE | Admit: 2016-01-10 | Payer: Self-pay | Source: Ambulatory Visit

## 2016-01-15 ENCOUNTER — Inpatient Hospital Stay: Admission: RE | Admit: 2016-01-15 | Payer: Self-pay | Source: Ambulatory Visit

## 2016-01-19 DIAGNOSIS — G894 Chronic pain syndrome: Secondary | ICD-10-CM | POA: Diagnosis not present

## 2016-01-19 DIAGNOSIS — Z79899 Other long term (current) drug therapy: Secondary | ICD-10-CM | POA: Diagnosis not present

## 2016-01-19 DIAGNOSIS — Z79891 Long term (current) use of opiate analgesic: Secondary | ICD-10-CM | POA: Diagnosis not present

## 2016-01-19 DIAGNOSIS — M545 Low back pain: Secondary | ICD-10-CM | POA: Diagnosis not present

## 2016-01-19 DIAGNOSIS — M25569 Pain in unspecified knee: Secondary | ICD-10-CM | POA: Diagnosis not present

## 2016-01-19 DIAGNOSIS — M25579 Pain in unspecified ankle and joints of unspecified foot: Secondary | ICD-10-CM | POA: Diagnosis not present

## 2016-01-22 ENCOUNTER — Other Ambulatory Visit: Payer: Self-pay

## 2016-01-29 ENCOUNTER — Ambulatory Visit
Admission: RE | Admit: 2016-01-29 | Discharge: 2016-01-29 | Disposition: A | Discharge status: Home / Self Care | Payer: Medicare Other | Source: Ambulatory Visit | Attending: Pain Medicine | Admitting: Pain Medicine

## 2016-01-29 DIAGNOSIS — M545 Low back pain: Secondary | ICD-10-CM

## 2016-01-29 DIAGNOSIS — M5126 Other intervertebral disc displacement, lumbar region: Secondary | ICD-10-CM | POA: Diagnosis not present

## 2016-02-11 ENCOUNTER — Ambulatory Visit
Admission: RE | Admit: 2016-02-11 | Discharge: 2016-02-11 | Disposition: A | Discharge status: Home / Self Care | Payer: Medicare Other | Source: Ambulatory Visit | Attending: Oncology | Admitting: Oncology

## 2016-02-11 DIAGNOSIS — C50911 Malignant neoplasm of unspecified site of right female breast: Secondary | ICD-10-CM

## 2016-02-11 DIAGNOSIS — R928 Other abnormal and inconclusive findings on diagnostic imaging of breast: Secondary | ICD-10-CM | POA: Diagnosis not present

## 2016-02-21 DIAGNOSIS — M545 Low back pain: Secondary | ICD-10-CM | POA: Diagnosis not present

## 2016-02-21 DIAGNOSIS — M792 Neuralgia and neuritis, unspecified: Secondary | ICD-10-CM | POA: Diagnosis not present

## 2016-02-21 DIAGNOSIS — M25579 Pain in unspecified ankle and joints of unspecified foot: Secondary | ICD-10-CM | POA: Diagnosis not present

## 2016-02-21 DIAGNOSIS — Z79891 Long term (current) use of opiate analgesic: Secondary | ICD-10-CM | POA: Diagnosis not present

## 2016-02-21 DIAGNOSIS — Z79899 Other long term (current) drug therapy: Secondary | ICD-10-CM | POA: Diagnosis not present

## 2016-02-21 DIAGNOSIS — G894 Chronic pain syndrome: Secondary | ICD-10-CM | POA: Diagnosis not present

## 2016-03-01 ENCOUNTER — Other Ambulatory Visit: Payer: Self-pay | Admitting: Oncology

## 2016-03-15 ENCOUNTER — Ambulatory Visit (INDEPENDENT_AMBULATORY_CARE_PROVIDER_SITE_OTHER): Payer: Medicare Other | Admitting: Internal Medicine

## 2016-03-15 ENCOUNTER — Encounter: Payer: Self-pay | Admitting: Internal Medicine

## 2016-03-15 VITALS — BP 134/90 | HR 128 | Temp 98.0°F | Resp 20 | Ht 66.0 in | Wt 242.0 lb

## 2016-03-15 DIAGNOSIS — C50911 Malignant neoplasm of unspecified site of right female breast: Secondary | ICD-10-CM

## 2016-03-15 DIAGNOSIS — R600 Localized edema: Secondary | ICD-10-CM

## 2016-03-15 DIAGNOSIS — I5033 Acute on chronic diastolic (congestive) heart failure: Secondary | ICD-10-CM

## 2016-03-15 DIAGNOSIS — C50111 Malignant neoplasm of central portion of right female breast: Secondary | ICD-10-CM | POA: Diagnosis not present

## 2016-03-15 DIAGNOSIS — K5909 Other constipation: Secondary | ICD-10-CM

## 2016-03-15 DIAGNOSIS — D509 Iron deficiency anemia, unspecified: Secondary | ICD-10-CM | POA: Diagnosis not present

## 2016-03-15 DIAGNOSIS — F411 Generalized anxiety disorder: Secondary | ICD-10-CM

## 2016-03-15 DIAGNOSIS — K5900 Constipation, unspecified: Secondary | ICD-10-CM

## 2016-03-15 DIAGNOSIS — I10 Essential (primary) hypertension: Secondary | ICD-10-CM

## 2016-03-15 LAB — CBC WITH DIFFERENTIAL/PLATELET
BASOS PCT: 0.3 % (ref 0.0–3.0)
Basophils Absolute: 0 10*3/uL (ref 0.0–0.1)
EOS ABS: 0.2 10*3/uL (ref 0.0–0.7)
Eosinophils Relative: 2.1 % (ref 0.0–5.0)
HEMATOCRIT: 40.1 % (ref 36.0–46.0)
HEMOGLOBIN: 13 g/dL (ref 12.0–15.0)
LYMPHS PCT: 24.2 % (ref 12.0–46.0)
Lymphs Abs: 2 10*3/uL (ref 0.7–4.0)
MCHC: 32.4 g/dL (ref 30.0–36.0)
MCV: 83.7 fl (ref 78.0–100.0)
MONOS PCT: 7.8 % (ref 3.0–12.0)
Monocytes Absolute: 0.7 10*3/uL (ref 0.1–1.0)
NEUTROS ABS: 5.5 10*3/uL (ref 1.4–7.7)
Neutrophils Relative %: 65.6 % (ref 43.0–77.0)
PLATELETS: 264 10*3/uL (ref 150.0–400.0)
RBC: 4.79 Mil/uL (ref 3.87–5.11)
RDW: 16.6 % — AB (ref 11.5–15.5)
WBC: 8.4 10*3/uL (ref 4.0–10.5)

## 2016-03-15 LAB — COMPREHENSIVE METABOLIC PANEL
ALBUMIN: 4.4 g/dL (ref 3.5–5.2)
ALT: 53 U/L — ABNORMAL HIGH (ref 0–35)
AST: 40 U/L — AB (ref 0–37)
Alkaline Phosphatase: 153 U/L — ABNORMAL HIGH (ref 39–117)
BUN: 28 mg/dL — ABNORMAL HIGH (ref 6–23)
CALCIUM: 10 mg/dL (ref 8.4–10.5)
CHLORIDE: 95 meq/L — AB (ref 96–112)
CO2: 33 meq/L — AB (ref 19–32)
CREATININE: 1.03 mg/dL (ref 0.40–1.20)
GFR: 57.49 mL/min — AB (ref >60.00)
Glucose, Bld: 120 mg/dL — ABNORMAL HIGH (ref 70–99)
POTASSIUM: 3.2 meq/L — AB (ref 3.5–5.1)
SODIUM: 140 meq/L (ref 135–145)
Total Bilirubin: 0.3 mg/dL (ref 0.2–1.2)
Total Protein: 8 g/dL (ref 6.0–8.3)

## 2016-03-15 MED ORDER — LUBIPROSTONE 24 MCG PO CAPS: 24.0000 ug | ORAL_CAPSULE | Freq: Two times a day (BID) | ORAL | Status: DC

## 2016-03-15 MED ORDER — ATORVASTATIN CALCIUM 20 MG PO TABS: 20.0000 mg | ORAL_TABLET | Freq: Every day | ORAL | Status: DC

## 2016-03-15 MED ORDER — ANASTROZOLE 1 MG PO TABS: 1.0000 mg | ORAL_TABLET | Freq: Every day | ORAL | Status: DC

## 2016-03-15 MED ORDER — CARVEDILOL 6.25 MG PO TABS: 6.2500 mg | ORAL_TABLET | Freq: Two times a day (BID) | ORAL | Status: DC

## 2016-03-15 MED ORDER — LORAZEPAM 0.5 MG PO TABS: 0.5000 mg | ORAL_TABLET | Freq: Three times a day (TID) | ORAL | Status: DC | PRN

## 2016-03-15 NOTE — Progress Notes (Signed)
Subjective:    Patient ID: Alexandria [name], female    DOB: 1951/05/30, 65 y.o.   MRN: [id number]  HPI  65 year old patient who is seen today in follow-up.  She has not been seen in over one year.  She has been followed by oncology with a history of right breast cancer and also iron deficiency anemia.  This has been treated with iron infusions.  The patient has not been on any recent oral iron supplements.  She has essential hypertension and chronic back pain.  He continues to be followed by chronic pain management.  She has a history of chronic diastolic heart failure and chronic constipation She is quite tearful today.  After discussion of being discharged from the oncology service, apparently due to no shows. In general seems fairly stable.  Past Medical History  Diagnosis Date  . Iron deficiency anemia   . Status post chemotherapy     4 cycles of Taxotere and cytoxan  . Chronic back pain     "all the way up and down"  . Hx of pulmonary embolus     During c-Section  . 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 Obesity     Class 2  . S/P radiation therapy  03/04/2013-04/17/2013    1) Right breast / 50 Gy in 25 fractions/ 2) Right breast boost / 10 Gy in 5 fractions  . Diastolic heart failure ([place])   . Hx of radiation therapy 03/04/13- 04/17/13    right breast 50 Gy 25 fractions, right breast boost 10 Gy 5 fractions  . Anxiety   . H/O hiatal hernia   . Breast cancer ([place]) 10/02/12    Invasive Ductal Carcinoma of the Right Upper Outer Quadrant - ER (>90%), PR - Neg., Her2 Neu Negative, Ki-67 Unknown  . GAD (generalized anxiety disorder)   . Recurrent major depression ([place]) 06/2014    Seen by Dr. Louretta Shorten  . Lymphedema     bilateral lower extremity  . Complication of anesthesia     "I come out really anxious; need Ativan to ease me out" (09/07/2014)  . Hypertension   . Diastolic dysfunction   . Pneumonia     "at least twice/yr" (09/07/2014)  . Chronic bronchitis ([place])     "get it q  yr" (09/07/2014)  . On home oxygen therapy     "2L just at night" (09/07/2014)  . History of blood transfusion "3-4"    "never can tell why; don't know where the blood was coming from; doesn't show up in stool or urine"  . Daily headache     "recently" (09/07/2014)  . DDD (degenerative disc disease), cervical   . DDD (degenerative disc disease), lumbosacral   . DDD (degenerative disc disease), thoracolumbar   . Fibromyalgia   . Hyperlipidemia      Social History   Social History  . Marital Status: Married    Spouse Name: N/A  . Number of Children: N/A  . Years of Education: N/A   Occupational History  . retired Therapist, sports    Social History Main Topics  . Smoking status: Passive Smoke Exposure - Never Smoker  . Smokeless tobacco: Never Used     Comment: Through father  . Alcohol Use: No  . Drug Use: No  . Sexual Activity: Not Currently   Other Topics Concern  . Not on file   Social History Narrative   Originally from [place], [place]. Previously lived in [place] to [facility] in 2014. Previously  worked as an Warden/ranger. No known TB exposure. No pets currently. No bird exposure. Minimal mold exposure.     Past Surgical History  Procedure Laterality Date  . Cesarean section  9485; 1978; 1980;     "stillborn in 66"  . Partial colectomy N/A 07/04/2014    Procedure: REPAIR OF INCARCERATED INCISIONAL HERNIA WITH MESH;  Surgeon: Excell Seltzer, MD;  Location: [facility] ORS;  Service: General;  Laterality: N/A;  . Tonsillectomy    . Appendectomy    . Breast biopsy Right   . Breast lumpectomy Right   . Inguinal hernia repair Bilateral     "cancer"  . Umbilical hernia repair  X 2  . Dilation and curettage of uterus  "numerous"  . Abdominal hysterectomy    . Foot surgery Right ~ 2013 X 3    "put pins in but pins kept breaking"    Family History  Problem Relation Age of Onset  . Parkinson's disease Mother   . Emphysema Maternal Grandfather   . COPD Maternal Grandfather   . COPD Sister    . CAD Neg Hx   . COPD Maternal Grandmother     Allergies  Allergen Reactions  . Contrast Media [Iodinated Diagnostic Agents] Anaphylaxis  . Albuterol Other (See Comments)    Anxious   . Prednisone Other (See Comments)    Pt gets very agitated when she takes high doses of steroids    Current Outpatient Prescriptions on File Prior to Visit  Medication Sig Dispense Refill  . anastrozole (ARIMIDEX) 1 MG tablet Take 1 tablet (1 mg total) by mouth daily. 90 tablet 0  . carvedilol (COREG) 3.125 MG tablet TAKE 1 TABLET BY MOUTH TWICE DAILY WITH A MEAL 180 tablet 2  . cyclobenzaprine (FLEXERIL) 10 MG tablet As directed  6  . docusate sodium 100 MG CAPS Take 100 mg by mouth 2 (two) times daily. (Patient taking differently: Take 100 mg by mouth daily as needed (mild constipation). ) 10 capsule 0  . escitalopram (LEXAPRO) 10 MG tablet Take 1 tablet (10 mg total) by mouth 2 (two) times daily. 180 tablet 0  . furosemide (LASIX) 80 MG tablet TAKE 1 TABLET BY MOUTH TWICE DAILY 180 tablet 0  . HYDROcodone-homatropine (HYCODAN) 5-1.5 MG/5ML syrup Take 5 mLs by mouth every 6 (six) hours as needed for cough. 120 mL 0  . lisinopril (PRINIVIL,ZESTRIL) 5 MG tablet TAKE 1 TABLET BY MOUTH DAILY 30 tablet 5  . metolazone (ZAROXOLYN) 2.5 MG tablet Take 2.5 mg every other day 30 min before am Lasix 20 tablet 3  . oxyCODONE-acetaminophen (PERCOCET) 10-325 MG tablet Take 1 tablet by mouth every 6 (six) hours as needed for pain. 15 tablet 0  . oxymorphone (OPANA ER) 30 MG 12 hr tablet Take 1 tablet by mouth 2 (two) times daily.   0  . potassium chloride SA (K-DUR,KLOR-CON) 20 MEQ tablet Take 1 tablet (20 mEq total) by mouth 3 (three) times daily. (Patient taking differently: Take 80 mEq by mouth 3 (three) times daily. ) 270 tablet 1  . ranitidine (ZANTAC) 150 MG tablet Take 1 tablet (150 mg total) by mouth at bedtime. 30 tablet 3   No current facility-administered medications on file prior to visit.    BP 134/90  mmHg  Pulse 128  Temp(Src) 98 F (36.7 C) (Oral)  Resp 20  Ht _0  (1.676 m)  Wt 242 lb (109.77 kg)  BMI 39.08 kg/m2  SpO2 96%  LMP 01/30/2005  Review of Systems  Constitutional: Positive for fatigue.  HENT: Negative for congestion, dental problem, hearing loss, rhinorrhea, sinus pressure, sore throat and tinnitus.   Eyes: Negative for pain, discharge and visual disturbance.  Respiratory: Negative for cough and shortness of breath.   Cardiovascular: Positive for leg swelling. Negative for chest pain and palpitations.  Gastrointestinal: Negative for nausea, vomiting, abdominal pain, diarrhea, constipation, blood in stool and abdominal distention.  Genitourinary: Negative for dysuria, urgency, frequency, hematuria, flank pain, vaginal bleeding, vaginal discharge, difficulty urinating, vaginal pain and pelvic pain.  Musculoskeletal: Positive for back pain, arthralgias and gait problem. Negative for joint swelling.  Skin: Negative for rash.  Neurological: Positive for weakness. Negative for dizziness, syncope, speech difficulty, numbness and headaches.  Hematological: Negative for adenopathy.  Psychiatric/Behavioral: Negative for behavioral problems, dysphoric mood and agitation. The patient is nervous/anxious.        Objective:   Physical Exam  Constitutional: She is oriented to person, place, and time. She appears well-developed and well-nourished.   Obese Anxious and at times tearful Blood pressure 130/90 Pulse 100- 120  HENT:  Head: Normocephalic.  Right Ear: External ear normal.  Left Ear: External ear normal.  Mouth/Throat: Oropharynx is clear and moist.  Eyes: Conjunctivae and EOM are normal. Pupils are equal, round, and reactive to light.  Neck: Normal range of motion. Neck supple. No thyromegaly present.  Cardiovascular: Normal rate, regular rhythm, normal heart sounds and intact distal pulses.   Resting tachycardia  Pulmonary/Chest: Effort normal and breath  sounds normal.  Abdominal: Soft. Bowel sounds are normal. She exhibits no mass. There is no tenderness.  Musculoskeletal: Normal range of motion. She exhibits edema.  Prominent lower extremity edema  Lymphadenopathy:    She has no cervical adenopathy.  Neurological: She is alert and oriented to person, place, and time.  Skin: Skin is warm and dry. No rash noted.  Psychiatric: She has a normal mood and affect. Her behavior is normal.          Assessment & Plan:   Osteopenia.  Calcium and vitamin D supplements will be started Iron deficiency anemia.  Oral supplements encouraged.  Will schedule follow-up with oncology/hematology History of breast cancer.  Follow-up oncology Essential hypertension History diastolic heart failure.  Patient has been taking carvedilol 3.125 milligrams once daily only will increase to 6.25 twice a day  We'll check updated lab including CBC and Cmet  Nyoka Cowden, MD

## 2016-03-15 NOTE — Progress Notes (Signed)
Pre visit review using our clinic review tool, if applicable. No additional management support is needed unless otherwise documented below in the visit note. 

## 2016-03-15 NOTE — Patient Instructions (Signed)
Limit your sodium (Salt) intake  Take an iron supplement 3 times daily  Take a calcium supplement, plus [phone] units of vitamin D  Oncology follow-up.  Will schedule  Return in 3 months for follow-up

## 2016-03-20 DIAGNOSIS — E669 Obesity, unspecified: Secondary | ICD-10-CM | POA: Diagnosis not present

## 2016-03-20 DIAGNOSIS — M5137 Other intervertebral disc degeneration, lumbosacral region: Secondary | ICD-10-CM | POA: Diagnosis not present

## 2016-03-20 DIAGNOSIS — M4696 Unspecified inflammatory spondylopathy, lumbar region: Secondary | ICD-10-CM | POA: Diagnosis not present

## 2016-03-20 DIAGNOSIS — Z79899 Other long term (current) drug therapy: Secondary | ICD-10-CM | POA: Diagnosis not present

## 2016-03-20 DIAGNOSIS — G894 Chronic pain syndrome: Secondary | ICD-10-CM | POA: Diagnosis not present

## 2016-03-20 DIAGNOSIS — Z79891 Long term (current) use of opiate analgesic: Secondary | ICD-10-CM | POA: Diagnosis not present

## 2016-03-20 DIAGNOSIS — M47817 Spondylosis without myelopathy or radiculopathy, lumbosacral region: Secondary | ICD-10-CM | POA: Diagnosis not present

## 2016-04-19 DIAGNOSIS — Z79891 Long term (current) use of opiate analgesic: Secondary | ICD-10-CM | POA: Diagnosis not present

## 2016-04-19 DIAGNOSIS — Z79899 Other long term (current) drug therapy: Secondary | ICD-10-CM | POA: Diagnosis not present

## 2016-04-19 DIAGNOSIS — M4696 Unspecified inflammatory spondylopathy, lumbar region: Secondary | ICD-10-CM | POA: Diagnosis not present

## 2016-04-19 DIAGNOSIS — G894 Chronic pain syndrome: Secondary | ICD-10-CM | POA: Diagnosis not present

## 2016-04-19 DIAGNOSIS — E669 Obesity, unspecified: Secondary | ICD-10-CM | POA: Diagnosis not present

## 2016-04-19 DIAGNOSIS — M47817 Spondylosis without myelopathy or radiculopathy, lumbosacral region: Secondary | ICD-10-CM | POA: Diagnosis not present

## 2016-04-19 DIAGNOSIS — M5137 Other intervertebral disc degeneration, lumbosacral region: Secondary | ICD-10-CM | POA: Diagnosis not present

## 2016-04-22 ENCOUNTER — Encounter: Payer: Self-pay | Admitting: Family Medicine

## 2016-04-22 ENCOUNTER — Ambulatory Visit (INDEPENDENT_AMBULATORY_CARE_PROVIDER_SITE_OTHER): Payer: Medicare Other | Admitting: Family Medicine

## 2016-04-22 VITALS — BP 140/90 | HR 124 | Temp 98.4°F

## 2016-04-22 DIAGNOSIS — R062 Wheezing: Secondary | ICD-10-CM

## 2016-04-22 DIAGNOSIS — R000 Tachycardia, unspecified: Secondary | ICD-10-CM

## 2016-04-22 MED ORDER — IPRATROPIUM BROMIDE 0.02 % IN SOLN: 0.5000 mg | Freq: Once | RESPIRATORY_TRACT | Status: DC

## 2016-04-22 MED ORDER — ALBUTEROL SULFATE (2.5 MG/3ML) 0.083% IN NEBU
2.5000 mg | INHALATION_SOLUTION | Freq: Once | RESPIRATORY_TRACT | Status: AC
  Administered 2016-04-22: 2.5 mg via RESPIRATORY_TRACT

## 2016-04-22 MED ORDER — ALBUTEROL SULFATE HFA 108 (90 BASE) MCG/ACT IN AERS: 2.0000 | INHALATION_SPRAY | Freq: Four times a day (QID) | RESPIRATORY_TRACT | 2 refills | Status: AC | PRN

## 2016-04-22 MED ORDER — IPRATROPIUM-ALBUTEROL 0.5-2.5 (3) MG/3ML IN SOLN
3.0000 mL | Freq: Once | RESPIRATORY_TRACT | Status: AC
  Administered 2016-04-22: 3 mL via RESPIRATORY_TRACT

## 2016-04-22 NOTE — Addendum Note (Signed)
Addended by: Lurlean Nanny on: 04/22/2016 01:39 PM   Modules accepted: Orders

## 2016-04-22 NOTE — Progress Notes (Signed)
[place] [facility] [address] [place], [place] [address] [phone]  Date:  04/22/2016   Name:  Alexandria [name]   DOB:  29-May-1951   MRN:  [id number]  PCP:  Nyoka Cowden, MD    Chief Complaint: Cough (x 1 week---productive green sputum--SOB--chest pressure and upper back...pt has leftover hycodan w/ some relief---pt has not taken any OTC meds)   History of Present Illness:  Alexandria [name] is a 65 y.o. very pleasant female patient who presents with the following:  Here at Saturday clinic with a cough.  She notes a cough that is hurting her chest and back.   It is productive She has checked her temp- she got up to 102 yesterday, treated with tylenol.  She has noted the cough for about 4 days, worse cough with fever for 2 days.   She did have a PE 20 years ago.  No current anticoagulation She got sick last year with pneumonia- she has been well for about one year.  She has not noted any swelling of her legs.  She does have CHF but does not think this is her current problem.    She IS able to use albuterol- it makes her feel anxious but she is not actually allergic to it.  She states that when she uses it at home will use her ativan for resultant anxiety.  She had an inhaler but ran out She is on coreg but did not take it today- thinks this is why she is so tachycardic today  States that she does not want to go to the ER "they will admit me, every time I get like this they admit me."     Wt Readings from Last 3 Encounters:  03/15/16 242 lb (109.8 kg)  11/22/15 248 lb 12.8 oz (112.9 kg)  08/31/15 242 lb (109.8 kg)     Patient Active Problem List   Diagnosis Date Noted  . Malignant neoplasm of upper-outer quadrant of female breast ([place])   . Pulmonary embolus ([place]) 05/31/2015  . Acute on chronic kidney failure ([place]) 12/05/2014  . HCAP (healthcare-associated pneumonia) 12/04/2014  . Breast cancer ([place]) 12/04/2014   . Sepsis ([place]) 12/04/2014  . Chronic bronchitis ([place])   . Hyperlipidemia 10/23/2014  . Hypoxia   . Cough   . Essential hypertension   . Elevated blood pressure 09/07/2014  . Lymphedema 09/07/2014  . Obesity 09/07/2014  . Nocturnal hypoxemia 08/18/2014  . Ventral incisional hernia with obstruction 07/05/2014  . Coronary artery calcification 05/22/2014  . Mediastinal lymphadenopathy 01/29/2014  . DOE (dyspnea on exertion) 01/29/2014  . Ventral hernia 01/23/2014  . Hiatal hernia 01/23/2014  . Symptomatic anemia 11/19/2013  . Hx of radiation therapy   . Protein-calorie malnutrition, severe ([place]) 04/19/2013  . SVT (supraventricular tachycardia) ([place]) 04/19/2013  . Anasarca 04/15/2013  . Vocal cord dysfunction 04/15/2013  . Acute on chronic diastolic CHF (congestive heart failure) ([place]) 04/14/2013  . Anxiety state 04/14/2013  . Hypokalemia 04/13/2013  . Iron deficiency anemia 04/13/2013  . Hypertension 04/08/2013  . Chronic back pain 04/08/2013  . Morbid obesity ([place]) 04/08/2013  . Adjustment disorder with mixed anxiety and depressed mood 04/08/2013  . Lower extremity edema 04/08/2013  . Systolic murmur 44/69/5072  . Chronic constipation 04/08/2013  . Breast cancer, right breast ([place]) 02/21/2013    Past Medical History:  Diagnosis Date  . Anxiety   . Breast cancer ([place]) 10/02/12  Invasive Ductal Carcinoma of the Right Upper Outer Quadrant - ER (>90%), PR - Neg., Her2 Neu Negative, Ki-67 Unknown  . Chronic back pain    "all the way up and down"  . Chronic bronchitis ([place])    "get it q yr" (09/07/2014)  . Complication of anesthesia    "I come out really anxious; need Ativan to ease me out" (09/07/2014)  . Daily headache    "recently" (09/07/2014)  . DDD (degenerative disc disease), cervical   . DDD (degenerative disc disease), lumbosacral   . DDD (degenerative disc disease), thoracolumbar   . Diastolic dysfunction   . Diastolic heart failure ([place])   . Fibromyalgia   .  GAD (generalized anxiety disorder)   . H/O hiatal hernia   . History of blood transfusion "3-4"   "never can tell why; don't know where the blood was coming from; doesn't show up in stool or urine"  . Hx of pulmonary embolus    During c-Section  . Hx of radiation therapy 03/04/13- 04/17/13   right breast 50 Gy 25 fractions, right breast boost 10 Gy 5 fractions  . Hyperlipidemia   . Hypertension   . Iron deficiency anemia   . Lymphedema    bilateral lower extremity  . Obesity    Class 2  . On home oxygen therapy    "2L just at night" (09/07/2014)  . Peripheral neuropathic pain ([place])   . Pneumonia    "at least twice/yr" (09/07/2014)  . Recurrent major depression ([place]) 06/2014   Seen by Dr. Louretta Shorten  . S/P radiation therapy  03/04/2013-04/17/2013   1) Right breast / 50 Gy in 25 fractions/ 2) Right breast boost / 10 Gy in 5 fractions  . Status post chemotherapy    4 cycles of Taxotere and cytoxan    Past Surgical History:  Procedure Laterality Date  . ABDOMINAL HYSTERECTOMY    . APPENDECTOMY    . BREAST BIOPSY Right   . BREAST LUMPECTOMY Right   . [place]; 1978; 64;    "stillborn in 48"  . DILATION AND CURETTAGE OF UTERUS  "numerous"  . FOOT SURGERY Right ~ 2013 X 3   "put pins in but pins kept breaking"  . INGUINAL HERNIA REPAIR Bilateral    "cancer"  . PARTIAL COLECTOMY N/A 07/04/2014   Procedure: REPAIR OF INCARCERATED INCISIONAL HERNIA WITH MESH;  Surgeon: Excell Seltzer, MD;  Location: [facility] ORS;  Service: General;  Laterality: N/A;  . TONSILLECTOMY    . UMBILICAL HERNIA REPAIR  X 2    Social History  Substance Use Topics  . Smoking status: Passive Smoke Exposure - Never Smoker  . Smokeless tobacco: Never Used     Comment: Through father  . Alcohol use No    Family History  Problem Relation Age of Onset  . Parkinson's disease Mother   . Emphysema Maternal Grandfather   . COPD Maternal Grandfather   . COPD Sister   . CAD Neg Hx   . COPD  Maternal Grandmother     Allergies  Allergen Reactions  . Contrast Media [Iodinated Diagnostic Agents] Anaphylaxis  . Albuterol Other (See Comments)    Anxious   . Prednisone Other (See Comments)    Pt gets very agitated when she takes high doses of steroids    Medication list has been reviewed and updated.  Current Outpatient Prescriptions on File Prior to Visit  Medication Sig Dispense Refill  . anastrozole (ARIMIDEX) 1 MG tablet Take 1  tablet (1 mg total) by mouth daily. 90 tablet 0  . atorvastatin (LIPITOR) 20 MG tablet Take 1 tablet (20 mg total) by mouth daily. 90 tablet 1  . carvedilol (COREG) 6.25 MG tablet Take 1 tablet (6.25 mg total) by mouth 2 (two) times daily with a meal. 180 tablet 3  . cyclobenzaprine (FLEXERIL) 10 MG tablet As directed  6  . docusate sodium 100 MG CAPS Take 100 mg by mouth 2 (two) times daily. (Patient taking differently: Take 100 mg by mouth daily as needed (mild constipation). ) 10 capsule 0  . escitalopram (LEXAPRO) 10 MG tablet Take 1 tablet (10 mg total) by mouth 2 (two) times daily. 180 tablet 0  . furosemide (LASIX) 80 MG tablet TAKE 1 TABLET BY MOUTH TWICE DAILY 180 tablet 0  . lisinopril (PRINIVIL,ZESTRIL) 5 MG tablet TAKE 1 TABLET BY MOUTH DAILY 30 tablet 5  . LORazepam (ATIVAN) 0.5 MG tablet Take 1 tablet (0.5 mg total) by mouth every 8 (eight) hours as needed. for anxiety 270 tablet 0  . lubiprostone (AMITIZA) 24 MCG capsule Take 1 capsule (24 mcg total) by mouth 2 (two) times daily with a meal. 60 capsule 2  . metolazone (ZAROXOLYN) 2.5 MG tablet Take 2.5 mg every other day 30 min before am Lasix 20 tablet 3  . oxyCODONE-acetaminophen (PERCOCET) 10-325 MG tablet Take 1 tablet by mouth every 6 (six) hours as needed for pain. 15 tablet 0  . oxymorphone (OPANA ER) 30 MG 12 hr tablet Take 1 tablet by mouth 2 (two) times daily.   0  . potassium chloride SA (K-DUR,KLOR-CON) 20 MEQ tablet Take 1 tablet (20 mEq total) by mouth 3 (three) times daily.  (Patient taking differently: Take 80 mEq by mouth 3 (three) times daily. ) 270 tablet 1  . ranitidine (ZANTAC) 150 MG tablet Take 1 tablet (150 mg total) by mouth at bedtime. 30 tablet 3   No current facility-administered medications on file prior to visit.     Review of Systems:  As per HPI- otherwise negative. States that she has been using a hydrocodone cough syrup and would like more    Physical Examination: Vitals:   04/22/16 1050  BP: 140/90  Pulse: (!) 124  Temp: 98.4 F (36.9 C)   Vitals:   There is no height or weight on file to calculate BMI. Ideal Body Weight:    GEN: WDWN, NAD, Non-toxic, A & O x 3, obese, coughing hard in room.  tachycardic HEENT: Atraumatic, Normocephalic. Neck supple. No masses, No LAD. Ears and Nose: No external deformity. CV: RRR, No M/G/R. No JVD. No thrill. No extra heart sounds. 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 Conversant. Not depressed or anxious appearing.  Calm demeanor.   duoneb- some improvement of air flow but still tachycardic to 120 BPM  Assessment and Plan: Wheezing - Plan: albuterol (PROVENTIL) (2.5 MG/3ML) 0.083% nebulizer solution 2.5 mg, ipratropium (ATROVENT) nebulizer solution 0.5 mg, albuterol (PROVENTIL HFA;VENTOLIN HFA) 108 (90 Base) MCG/ACT inhaler  Tachycardia  Here today with cough and suspected pneumonia, tachycardia and mild hypoxemia.  I am not able to get an x-ray or labs here today. Advised pt that she needs to go to the ER as I cannot do an adequate evaluation here.  Did agree to refill her albuterol but declined to write for narcotic cough rx as she is  on percocet already and has a pain contract.  Pt states understanding  Signed Lamar Blinks, MD

## 2016-04-22 NOTE — Patient Instructions (Signed)
Please go to the ER of your choice for further evaluation- you might try the [facility] as they tend to have less wait time  Address: [address] [place], [place] [address]  I sent in an inhaler for you to use for your cough and shortness of breath I am not able to adequately address your illness here today- please do to the ER today!

## 2016-04-24 ENCOUNTER — Telehealth: Payer: Self-pay | Admitting: Internal Medicine

## 2016-04-24 NOTE — Telephone Encounter (Signed)
Called and left message for pt to call office

## 2016-04-24 NOTE — Telephone Encounter (Signed)
Please can and schedule appointment for follow up on [facility] call.

## 2016-04-24 NOTE — Telephone Encounter (Signed)
lmom for pt to call an make appointment.

## 2016-04-24 NOTE — Telephone Encounter (Signed)
Patient Name: Alexandria [name] DOB: 08-17-1951 Initial Comment Caller states very congested and coughing up green mucus, having some trouble breathing Nurse Assessment Nurse: Ysidro Evert, RN, Shirlean Mylar Date/Time (Eastern Time): 04/24/2016 8:05:35 AM Confirm and document reason for call. If symptomatic, describe symptoms. You must click the next button to save text entered. ---Caller states that she has a bad cough and can't stopped. Nurse can hear coughing. Caller states she needs a nebulizer machine. No fever. Has the patient traveled out of the country within the last 30 days? ---No Does the patient have any new or worsening symptoms? ---Yes Will a triage be completed? ---Yes Related visit to physician within the last 2 weeks? ---Yes Does the PT have any chronic conditions? (i.e. diabetes, asthma, etc.) ---Yes List chronic conditions. ---Hx pneumonia and bronchitis every year. Is this a behavioral health or substance abuse call? ---No Guidelines Guideline Title Affirmed Question Affirmed Notes Cough - Acute Productive Wheezing is present Asthma Attack [1] Hospitalized before with asthma AND [2] feels the same now Final Disposition User Go to ED Now Ronnald Ramp, RN, [place] Comments PT was transferred to this nurse to make an appt. When the nurse spoke with the pt. she was having SOB and audible wheezing. Told pt she needed to go the ED now. She also states she was seen in the Sat clinic and the doctor wanted her to be seen in the ED at that time but did not go. She agrees to go to the ED now. Referrals REFERRED TO PCP [facility] - ED Disagree/Comply: Comply Disagree/Comply: Comply

## 2016-04-25 ENCOUNTER — Emergency Department (HOSPITAL_COMMUNITY): Payer: Medicare Other

## 2016-04-25 ENCOUNTER — Encounter (HOSPITAL_COMMUNITY): Payer: Self-pay | Admitting: Emergency Medicine

## 2016-04-25 ENCOUNTER — Inpatient Hospital Stay (HOSPITAL_COMMUNITY)
Admission: EM | Admit: 2016-04-25 | Discharge: 2016-04-27 | DRG: 190 | Disposition: A | Discharge status: Home / Self Care | Payer: Medicare Other | Attending: Internal Medicine | Admitting: Internal Medicine

## 2016-04-25 DIAGNOSIS — R000 Tachycardia, unspecified: Secondary | ICD-10-CM | POA: Diagnosis not present

## 2016-04-25 DIAGNOSIS — J42 Unspecified chronic bronchitis: Secondary | ICD-10-CM | POA: Diagnosis present

## 2016-04-25 DIAGNOSIS — G629 Polyneuropathy, unspecified: Secondary | ICD-10-CM | POA: Diagnosis not present

## 2016-04-25 DIAGNOSIS — J440 Chronic obstructive pulmonary disease with acute lower respiratory infection: Secondary | ICD-10-CM | POA: Diagnosis present

## 2016-04-25 DIAGNOSIS — E876 Hypokalemia: Secondary | ICD-10-CM | POA: Diagnosis present

## 2016-04-25 DIAGNOSIS — Z853 Personal history of malignant neoplasm of breast: Secondary | ICD-10-CM

## 2016-04-25 DIAGNOSIS — F339 Major depressive disorder, recurrent, unspecified: Secondary | ICD-10-CM | POA: Diagnosis present

## 2016-04-25 DIAGNOSIS — Z923 Personal history of irradiation: Secondary | ICD-10-CM

## 2016-04-25 DIAGNOSIS — M549 Dorsalgia, unspecified: Secondary | ICD-10-CM | POA: Diagnosis not present

## 2016-04-25 DIAGNOSIS — E785 Hyperlipidemia, unspecified: Secondary | ICD-10-CM | POA: Diagnosis not present

## 2016-04-25 DIAGNOSIS — Z86711 Personal history of pulmonary embolism: Secondary | ICD-10-CM | POA: Diagnosis not present

## 2016-04-25 DIAGNOSIS — Z7722 Contact with and (suspected) exposure to environmental tobacco smoke (acute) (chronic): Secondary | ICD-10-CM | POA: Diagnosis not present

## 2016-04-25 DIAGNOSIS — R0602 Shortness of breath: Secondary | ICD-10-CM | POA: Diagnosis present

## 2016-04-25 DIAGNOSIS — J441 Chronic obstructive pulmonary disease with (acute) exacerbation: Secondary | ICD-10-CM | POA: Diagnosis not present

## 2016-04-25 DIAGNOSIS — Z9981 Dependence on supplemental oxygen: Secondary | ICD-10-CM | POA: Diagnosis not present

## 2016-04-25 DIAGNOSIS — J9601 Acute respiratory failure with hypoxia: Secondary | ICD-10-CM | POA: Diagnosis present

## 2016-04-25 DIAGNOSIS — K449 Diaphragmatic hernia without obstruction or gangrene: Secondary | ICD-10-CM | POA: Diagnosis present

## 2016-04-25 DIAGNOSIS — I110 Hypertensive heart disease with heart failure: Secondary | ICD-10-CM | POA: Diagnosis present

## 2016-04-25 DIAGNOSIS — E669 Obesity, unspecified: Secondary | ICD-10-CM | POA: Diagnosis present

## 2016-04-25 DIAGNOSIS — Z825 Family history of asthma and other chronic lower respiratory diseases: Secondary | ICD-10-CM

## 2016-04-25 DIAGNOSIS — I471 Supraventricular tachycardia, unspecified: Secondary | ICD-10-CM | POA: Diagnosis present

## 2016-04-25 DIAGNOSIS — Z9221 Personal history of antineoplastic chemotherapy: Secondary | ICD-10-CM

## 2016-04-25 DIAGNOSIS — I890 Lymphedema, not elsewhere classified: Secondary | ICD-10-CM | POA: Diagnosis present

## 2016-04-25 DIAGNOSIS — I5032 Chronic diastolic (congestive) heart failure: Secondary | ICD-10-CM | POA: Diagnosis present

## 2016-04-25 DIAGNOSIS — J189 Pneumonia, unspecified organism: Secondary | ICD-10-CM | POA: Diagnosis not present

## 2016-04-25 DIAGNOSIS — M797 Fibromyalgia: Secondary | ICD-10-CM | POA: Diagnosis present

## 2016-04-25 DIAGNOSIS — Z9049 Acquired absence of other specified parts of digestive tract: Secondary | ICD-10-CM | POA: Diagnosis not present

## 2016-04-25 DIAGNOSIS — C50911 Malignant neoplasm of unspecified site of right female breast: Secondary | ICD-10-CM | POA: Diagnosis present

## 2016-04-25 DIAGNOSIS — Z820 Family history of epilepsy and other diseases of the nervous system: Secondary | ICD-10-CM

## 2016-04-25 DIAGNOSIS — J383 Other diseases of vocal cords: Secondary | ICD-10-CM | POA: Diagnosis present

## 2016-04-25 DIAGNOSIS — Z79899 Other long term (current) drug therapy: Secondary | ICD-10-CM

## 2016-04-25 DIAGNOSIS — F419 Anxiety disorder, unspecified: Secondary | ICD-10-CM | POA: Diagnosis present

## 2016-04-25 DIAGNOSIS — R0902 Hypoxemia: Secondary | ICD-10-CM | POA: Diagnosis present

## 2016-04-25 DIAGNOSIS — F4323 Adjustment disorder with mixed anxiety and depressed mood: Secondary | ICD-10-CM | POA: Diagnosis present

## 2016-04-25 DIAGNOSIS — J40 Bronchitis, not specified as acute or chronic: Secondary | ICD-10-CM

## 2016-04-25 DIAGNOSIS — K219 Gastro-esophageal reflux disease without esophagitis: Secondary | ICD-10-CM | POA: Diagnosis present

## 2016-04-25 DIAGNOSIS — G8929 Other chronic pain: Secondary | ICD-10-CM | POA: Diagnosis not present

## 2016-04-25 LAB — COMPREHENSIVE METABOLIC PANEL
ALT: 24 U/L (ref 14–54)
ANION GAP: 7 (ref 5–15)
AST: 25 U/L (ref 15–41)
Albumin: 3.8 g/dL (ref 3.5–5.0)
Alkaline Phosphatase: 143 U/L — ABNORMAL HIGH (ref 38–126)
BUN: 14 mg/dL (ref 6–20)
CHLORIDE: 104 mmol/L (ref 101–111)
CO2: 26 mmol/L (ref 22–32)
Calcium: 9.1 mg/dL (ref 8.9–10.3)
Creatinine, Ser: 0.83 mg/dL (ref 0.44–1.00)
GFR calc non Af Amer: >60 mL/min (ref >60)
Glucose, Bld: 114 mg/dL — ABNORMAL HIGH (ref 65–99)
POTASSIUM: 4 mmol/L (ref 3.5–5.1)
SODIUM: 137 mmol/L (ref 135–145)
Total Bilirubin: 0.6 mg/dL (ref 0.3–1.2)
Total Protein: 7.7 g/dL (ref 6.5–8.1)

## 2016-04-25 LAB — CBC
HEMATOCRIT: 35.5 % — AB (ref 36.0–46.0)
Hemoglobin: 10.9 g/dL — ABNORMAL LOW (ref 12.0–15.0)
MCH: 26.2 pg (ref 26.0–34.0)
MCHC: 30.7 g/dL (ref 30.0–36.0)
MCV: 85.3 fL (ref 78.0–100.0)
PLATELETS: 236 10*3/uL (ref 150–400)
RBC: 4.16 MIL/uL (ref 3.87–5.11)
RDW: 16.6 % — ABNORMAL HIGH (ref 11.5–15.5)
WBC: 7.3 10*3/uL (ref 4.0–10.5)

## 2016-04-25 LAB — I-STAT TROPONIN, ED: Troponin i, poc: 0 ng/mL (ref 0.00–0.08)

## 2016-04-25 LAB — I-STAT CG4 LACTIC ACID, ED: Lactic Acid, Venous: 1.27 mmol/L (ref 0.5–1.9)

## 2016-04-25 LAB — BRAIN NATRIURETIC PEPTIDE: B Natriuretic Peptide: 88.8 pg/mL (ref 0.0–100.0)

## 2016-04-25 MED ORDER — METOLAZONE 2.5 MG PO TABS: 2.5000 mg | ORAL_TABLET | Freq: Every day | ORAL | Status: DC

## 2016-04-25 MED ORDER — OXYCODONE-ACETAMINOPHEN 5-325 MG PO TABS
1.0000 | ORAL_TABLET | Freq: Three times a day (TID) | ORAL | Status: DC | PRN
  Administered 2016-04-25 – 2016-04-26 (×4): 1 via ORAL
  Filled 2016-04-25 (×4): qty 1

## 2016-04-25 MED ORDER — ALBUTEROL SULFATE HFA 108 (90 BASE) MCG/ACT IN AERS: 2.0000 | INHALATION_SPRAY | Freq: Four times a day (QID) | RESPIRATORY_TRACT | Status: DC | PRN

## 2016-04-25 MED ORDER — ONDANSETRON HCL 4 MG/2ML IJ SOLN: 4.0000 mg | Freq: Three times a day (TID) | INTRAMUSCULAR | Status: DC | PRN

## 2016-04-25 MED ORDER — CYCLOBENZAPRINE HCL 10 MG PO TABS
10.0000 mg | ORAL_TABLET | Freq: Every day | ORAL | Status: DC
  Administered 2016-04-25 – 2016-04-26 (×2): 10 mg via ORAL
  Filled 2016-04-25 (×2): qty 1

## 2016-04-25 MED ORDER — OXYCODONE HCL 5 MG PO TABS
5.0000 mg | ORAL_TABLET | Freq: Three times a day (TID) | ORAL | Status: DC | PRN
Start: 2016-04-25 — End: 2016-04-26
  Administered 2016-04-25 – 2016-04-26 (×2): 5 mg via ORAL
  Filled 2016-04-25 (×2): qty 1

## 2016-04-25 MED ORDER — TRIAMCINOLONE ACETONIDE 0.1 % EX CREA
1.0000 "application " | TOPICAL_CREAM | Freq: Four times a day (QID) | CUTANEOUS | Status: DC
  Administered 2016-04-25 – 2016-04-26 (×4): 1 via TOPICAL
  Filled 2016-04-25: qty 15

## 2016-04-25 MED ORDER — POTASSIUM CHLORIDE CRYS ER 20 MEQ PO TBCR
20.0000 meq | EXTENDED_RELEASE_TABLET | Freq: Two times a day (BID) | ORAL | Status: DC
  Administered 2016-04-25: 20 meq via ORAL
  Filled 2016-04-25: qty 1

## 2016-04-25 MED ORDER — ACETAMINOPHEN 325 MG PO TABS: 650.0000 mg | ORAL_TABLET | Freq: Four times a day (QID) | ORAL | Status: DC | PRN

## 2016-04-25 MED ORDER — LUBIPROSTONE 24 MCG PO CAPS
24.0000 ug | ORAL_CAPSULE | Freq: Two times a day (BID) | ORAL | Status: DC
  Administered 2016-04-25 – 2016-04-27 (×4): 24 ug via ORAL
  Filled 2016-04-25 (×4): qty 1

## 2016-04-25 MED ORDER — OXYCODONE-ACETAMINOPHEN 10-325 MG PO TABS: 1.0000 | ORAL_TABLET | Freq: Three times a day (TID) | ORAL | Status: DC | PRN

## 2016-04-25 MED ORDER — PROMETHAZINE HCL 25 MG/ML IJ SOLN
12.5000 mg | Freq: Three times a day (TID) | INTRAMUSCULAR | Status: DC | PRN
  Filled 2016-04-25: qty 1

## 2016-04-25 MED ORDER — DEXTROSE 5 % IV SOLN
1.0000 g | Freq: Once | INTRAVENOUS | Status: AC
  Administered 2016-04-25: 1 g via INTRAVENOUS
  Filled 2016-04-25: qty 10

## 2016-04-25 MED ORDER — DOCUSATE SODIUM 100 MG PO CAPS
200.0000 mg | ORAL_CAPSULE | Freq: Two times a day (BID) | ORAL | Status: DC
  Administered 2016-04-27: 200 mg via ORAL
  Filled 2016-04-25 (×4): qty 2

## 2016-04-25 MED ORDER — LISINOPRIL 5 MG PO TABS: 5.0000 mg | ORAL_TABLET | Freq: Every day | ORAL | Status: DC

## 2016-04-25 MED ORDER — ALBUTEROL SULFATE (2.5 MG/3ML) 0.083% IN NEBU
5.0000 mg | INHALATION_SOLUTION | Freq: Once | RESPIRATORY_TRACT | Status: AC
  Administered 2016-04-25: 5 mg via RESPIRATORY_TRACT
  Filled 2016-04-25: qty 6

## 2016-04-25 MED ORDER — ESCITALOPRAM OXALATE 10 MG PO TABS: 10.0000 mg | ORAL_TABLET | Freq: Two times a day (BID) | ORAL | Status: DC

## 2016-04-25 MED ORDER — FUROSEMIDE 40 MG PO TABS
80.0000 mg | ORAL_TABLET | Freq: Two times a day (BID) | ORAL | Status: DC
  Administered 2016-04-25: 80 mg via ORAL
  Filled 2016-04-25: qty 2

## 2016-04-25 MED ORDER — ALBUTEROL SULFATE (2.5 MG/3ML) 0.083% IN NEBU: 2.5000 mg | INHALATION_SOLUTION | Freq: Four times a day (QID) | RESPIRATORY_TRACT | Status: DC | PRN

## 2016-04-25 MED ORDER — ESCITALOPRAM OXALATE 10 MG PO TABS
10.0000 mg | ORAL_TABLET | Freq: Every day | ORAL | Status: DC
  Administered 2016-04-26 – 2016-04-27 (×2): 10 mg via ORAL
  Filled 2016-04-25 (×2): qty 1

## 2016-04-25 MED ORDER — CARVEDILOL 6.25 MG PO TABS
6.2500 mg | ORAL_TABLET | Freq: Two times a day (BID) | ORAL | Status: DC
  Administered 2016-04-25: 6.25 mg via ORAL
  Filled 2016-04-25: qty 1

## 2016-04-25 MED ORDER — CARVEDILOL 6.25 MG PO TABS: 6.2500 mg | ORAL_TABLET | Freq: Two times a day (BID) | ORAL | Status: DC

## 2016-04-25 MED ORDER — ANASTROZOLE 1 MG PO TABS
1.0000 mg | ORAL_TABLET | Freq: Every day | ORAL | Status: DC
  Administered 2016-04-25 – 2016-04-27 (×3): 1 mg via ORAL
  Filled 2016-04-25 (×5): qty 1

## 2016-04-25 MED ORDER — LORAZEPAM 0.5 MG PO TABS
0.5000 mg | ORAL_TABLET | Freq: Three times a day (TID) | ORAL | Status: DC | PRN
  Administered 2016-04-25 – 2016-04-26 (×2): 0.5 mg via ORAL
  Filled 2016-04-25 (×2): qty 1

## 2016-04-25 MED ORDER — ALBUTEROL SULFATE (2.5 MG/3ML) 0.083% IN NEBU
2.5000 mg | INHALATION_SOLUTION | RESPIRATORY_TRACT | Status: DC | PRN
Start: 2016-04-25 — End: 2016-04-25

## 2016-04-25 MED ORDER — POTASSIUM CHLORIDE CRYS ER 20 MEQ PO TBCR: 80.0000 meq | EXTENDED_RELEASE_TABLET | Freq: Three times a day (TID) | ORAL | Status: DC

## 2016-04-25 MED ORDER — ENOXAPARIN SODIUM 40 MG/0.4ML ~~LOC~~ SOLN: 40.0000 mg | SUBCUTANEOUS | Status: DC

## 2016-04-25 MED ORDER — MORPHINE SULFATE ER 30 MG PO TBCR
30.0000 mg | EXTENDED_RELEASE_TABLET | Freq: Two times a day (BID) | ORAL | Status: DC
  Administered 2016-04-25 – 2016-04-27 (×5): 30 mg via ORAL
  Filled 2016-04-25 (×5): qty 1

## 2016-04-25 MED ORDER — LIDOCAINE 5 % EX PTCH: 1.0000 | MEDICATED_PATCH | Freq: Every day | CUTANEOUS | Status: DC | PRN

## 2016-04-25 MED ORDER — DOXYCYCLINE HYCLATE 100 MG PO TABS
100.0000 mg | ORAL_TABLET | Freq: Two times a day (BID) | ORAL | Status: DC
  Administered 2016-04-26 – 2016-04-27 (×3): 100 mg via ORAL
  Filled 2016-04-25 (×3): qty 1

## 2016-04-25 MED ORDER — ONDANSETRON HCL 4 MG/2ML IJ SOLN: 4.0000 mg | Freq: Four times a day (QID) | INTRAMUSCULAR | Status: DC | PRN

## 2016-04-25 MED ORDER — LIDOCAINE 0.5 % EX GEL: 1.0000 "application " | Freq: Three times a day (TID) | CUTANEOUS | Status: DC

## 2016-04-25 MED ORDER — LIDOCAINE 5 % EX OINT
TOPICAL_OINTMENT | Freq: Three times a day (TID) | CUTANEOUS | Status: DC
  Administered 2016-04-26 (×3): via TOPICAL
  Filled 2016-04-25: qty 35.44

## 2016-04-25 MED ORDER — TIZANIDINE HCL 2 MG PO TABS: 2.0000 mg | ORAL_TABLET | Freq: Three times a day (TID) | ORAL | Status: DC | PRN

## 2016-04-25 MED ORDER — METHYLPREDNISOLONE SODIUM SUCC 125 MG IJ SOLR
60.0000 mg | Freq: Three times a day (TID) | INTRAMUSCULAR | Status: DC
  Administered 2016-04-25 – 2016-04-26 (×5): 60 mg via INTRAVENOUS
  Filled 2016-04-25 (×5): qty 2

## 2016-04-25 MED ORDER — DOCUSATE SODIUM 100 MG PO CAPS: 100.0000 mg | ORAL_CAPSULE | Freq: Every day | ORAL | Status: DC | PRN

## 2016-04-25 MED ORDER — ATORVASTATIN CALCIUM 20 MG PO TABS
20.0000 mg | ORAL_TABLET | Freq: Every day | ORAL | Status: DC
Start: 2016-04-25 — End: 2016-04-27
  Administered 2016-04-25 – 2016-04-26 (×2): 20 mg via ORAL
  Filled 2016-04-25: qty 1
  Filled 2016-04-25 (×2): qty 2
  Filled 2016-04-25: qty 1

## 2016-04-25 MED ORDER — FAMOTIDINE 20 MG PO TABS
20.0000 mg | ORAL_TABLET | Freq: Every day | ORAL | Status: DC
  Administered 2016-04-25 – 2016-04-26 (×2): 20 mg via ORAL
  Filled 2016-04-25 (×2): qty 1

## 2016-04-25 MED ORDER — AZITHROMYCIN 250 MG PO TABS
500.0000 mg | ORAL_TABLET | Freq: Once | ORAL | Status: AC
  Administered 2016-04-25: 500 mg via ORAL
  Filled 2016-04-25: qty 2

## 2016-04-25 MED ORDER — METOLAZONE 2.5 MG PO TABS
2.5000 mg | ORAL_TABLET | Freq: Once | ORAL | Status: DC
  Filled 2016-04-25: qty 1

## 2016-04-25 MED ORDER — DEXTROSE 5 % IV SOLN: 500.0000 mg | INTRAVENOUS | Status: DC

## 2016-04-25 MED ORDER — DEXTROSE 5 % IV SOLN
1.0000 g | INTRAVENOUS | Status: DC
  Administered 2016-04-26 – 2016-04-27 (×2): 1 g via INTRAVENOUS
  Filled 2016-04-25 (×2): qty 10

## 2016-04-25 MED ORDER — IPRATROPIUM BROMIDE 0.02 % IN SOLN
0.5000 mg | Freq: Once | RESPIRATORY_TRACT | Status: AC
  Administered 2016-04-25: 0.5 mg via RESPIRATORY_TRACT
  Filled 2016-04-25: qty 2.5

## 2016-04-25 MED ORDER — GUAIFENESIN-DM 100-10 MG/5ML PO SYRP
10.0000 mL | ORAL_SOLUTION | ORAL | Status: DC | PRN
  Administered 2016-04-25 – 2016-04-26 (×6): 10 mL via ORAL
  Filled 2016-04-25 (×7): qty 10

## 2016-04-25 NOTE — ED Notes (Signed)
attempted report second time

## 2016-04-25 NOTE — ED Notes (Signed)
Patient transported to X-ray 

## 2016-04-25 NOTE — ED Triage Notes (Signed)
Pt c/o chest pain worse with cough, knee pain. Pt states chest pain at rest is ongoing due to hiatal hernia. O2 saturation 88% on room air, no hx of low O2 saturatio nor COPD. Delayed inspiratory wheeze all lung fields, lower lung fields markedly diminished.

## 2016-04-25 NOTE — ED Notes (Signed)
MD at bedside. 

## 2016-04-25 NOTE — Progress Notes (Signed)
PT able to demonstrate verbal and hands on understanding of Flutter device.

## 2016-04-25 NOTE — Progress Notes (Signed)
04/25/16  1819  Spoke with Dr Candiss Norse per patient requesting cough medicine with Codeine. Per MD, he educated the patient that it would be an increase risk considering the other narcotics the patient is currently taking. MD ordered robintussin instead.

## 2016-04-25 NOTE — ED Notes (Signed)
Report attempted 

## 2016-04-25 NOTE — Progress Notes (Signed)
Pt has home meds in personal belonging bag and notified that meds need to be stored in pharmacy.  Pt refused and stated her husband will be present soon to take meds home.  Pt educated on the importance of sending meds home with family and not taking any personal meds while in hospital.  Pt verbalized understanding.

## 2016-04-25 NOTE — ED Provider Notes (Signed)
[facility] DEPT Provider Note   CSN: [id number] Arrival date & time: 04/25/16  7341  First Provider Contact:  None       History   Chief Complaint Chief Complaint  Patient presents with  . Shortness of Breath  . Chest Pain    HPI Alexandria [name] is a 65 y.o. female.  HPI Pt c/o chest pain worse with cough, knee pain. Pt states chest pain at rest is ongoing due to hiatal hernia. O2 saturation 88% on room air, no hx of low O2 saturatio nor COPD. Delayed inspiratory wheeze all lung fields, lower lung fields markedly diminished.  Past Medical History:  Diagnosis Date  . Anxiety   . 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 Chronic back pain    "all the way up and down"  . Chronic bronchitis ([place])    "get it q yr" (09/07/2014)  . Complication of anesthesia    "I come out really anxious; need Ativan to ease me out" (09/07/2014)  . Daily headache    "recently" (09/07/2014)  . DDD (degenerative disc disease), cervical   . DDD (degenerative disc disease), lumbosacral   . DDD (degenerative disc disease), thoracolumbar   . Diastolic dysfunction   . Diastolic heart failure ([place])   . Fibromyalgia   . GAD (generalized anxiety disorder)   . H/O hiatal hernia   . History of blood transfusion "3-4"   "never can tell why; don't know where the blood was coming from; doesn't show up in stool or urine"  . Hx of pulmonary embolus    During c-Section  . Hx of radiation therapy 03/04/13- 04/17/13   right breast 50 Gy 25 fractions, right breast boost 10 Gy 5 fractions  . Hyperlipidemia   . Hypertension   . Iron deficiency anemia   . Lymphedema    bilateral lower extremity  . Obesity    Class 2  . On home oxygen therapy    "2L just at night" (09/07/2014)  . Peripheral neuropathic pain ([place])   . Pneumonia    "at least twice/yr" (09/07/2014)  . Recurrent major depression ([place])  06/2014   Seen by Dr. Louretta Shorten  . S/P radiation therapy  03/04/2013-04/17/2013   1) Right breast / 50 Gy in 25 fractions/ 2) Right breast boost / 10 Gy in 5 fractions  . Status post chemotherapy    4 cycles of Taxotere and cytoxan    Patient Active Problem List   Diagnosis Date Noted  . CAP (community acquired pneumonia) 04/25/2016  . Malignant neoplasm of upper-outer quadrant of female breast ([place])   . Pulmonary embolus ([place]) 05/31/2015  . Acute on chronic kidney failure ([place]) 12/05/2014  . HCAP (healthcare-associated pneumonia) 12/04/2014  . Breast cancer ([place]) 12/04/2014  . Sepsis ([place]) 12/04/2014  . Chronic bronchitis ([place])   . Hyperlipidemia 10/23/2014  . Hypoxia   . Cough   . Essential hypertension   . Elevated blood pressure 09/07/2014  . Lymphedema 09/07/2014  . Obesity 09/07/2014  . Nocturnal hypoxemia 08/18/2014  . Ventral incisional hernia with obstruction 07/05/2014  . Coronary artery calcification 05/22/2014  . Mediastinal lymphadenopathy 01/29/2014  . DOE (dyspnea on exertion) 01/29/2014  . Ventral hernia 01/23/2014  . Hiatal hernia 01/23/2014  . Symptomatic anemia 11/19/2013  . Hx of radiation therapy   . Protein-calorie malnutrition, severe ([place]) 04/19/2013  . SVT (supraventricular tachycardia) (  [place]) 04/19/2013  . Anasarca 04/15/2013  . Vocal cord dysfunction 04/15/2013  . Acute on chronic diastolic CHF (congestive heart failure) ([place]) 04/14/2013  . Anxiety state 04/14/2013  . Hypokalemia 04/13/2013  . Iron deficiency anemia 04/13/2013  . Hypertension 04/08/2013  . Chronic back pain 04/08/2013  . Morbid obesity ([place]) 04/08/2013  . Adjustment disorder with mixed anxiety and depressed mood 04/08/2013  . Lower extremity edema 04/08/2013  . Systolic murmur 29/93/7169  . Chronic constipation 04/08/2013  . Breast cancer, right breast ([place]) 02/21/2013    Past Surgical History:  Procedure Laterality Date  . ABDOMINAL HYSTERECTOMY    . APPENDECTOMY      . BREAST BIOPSY Right   . BREAST LUMPECTOMY Right   . [place]; 1978; 89;    "stillborn in 31"  . DILATION AND CURETTAGE OF UTERUS  "numerous"  . FOOT SURGERY Right ~ 2013 X 3   "put pins in but pins kept breaking"  . INGUINAL HERNIA REPAIR Bilateral    "cancer"  . PARTIAL COLECTOMY N/A 07/04/2014   Procedure: REPAIR OF INCARCERATED INCISIONAL HERNIA WITH MESH;  Surgeon: Excell Seltzer, MD;  Location: [facility] ORS;  Service: General;  Laterality: N/A;  . TONSILLECTOMY    . UMBILICAL HERNIA REPAIR  X 2    OB History    No data available       Home Medications    Prior to Admission medications   Medication Sig Start Date End Date Taking? Authorizing Provider  albuterol (PROVENTIL HFA;VENTOLIN HFA) 108 (90 Base) MCG/ACT inhaler Inhale 2 puffs into the lungs every 6 (six) hours as needed for wheezing or shortness of breath. 04/22/16  Yes Gay Filler Copland, MD  anastrozole (ARIMIDEX) 1 MG tablet Take 1 tablet (1 mg total) by mouth daily. 03/15/16  Yes Marletta Lor, MD  atorvastatin (LIPITOR) 20 MG tablet Take 1 tablet (20 mg total) by mouth daily. 03/15/16  Yes Marletta Lor, MD  carvedilol (COREG) 6.25 MG tablet Take 1 tablet (6.25 mg total) by mouth 2 (two) times daily with a meal. 03/15/16  Yes Marletta Lor, MD  cyclobenzaprine (FLEXERIL) 10 MG tablet Take 10 mg by mouth at bedtime.  10/06/15  Yes Historical Provider, MD  docusate sodium 100 MG CAPS Take 100 mg by mouth 2 (two) times daily. Patient taking differently: Take 100 mg by mouth daily as needed (mild constipation).  07/10/14  Yes Nat Christen, PA-C  escitalopram (LEXAPRO) 10 MG tablet Take 1 tablet (10 mg total) by mouth 2 (two) times daily. 11/22/15  Yes Maryanna Shape, NP  furosemide (LASIX) 80 MG tablet TAKE 1 TABLET BY MOUTH TWICE DAILY 07/13/15  Yes Rhonda G Barrett, PA-C  lidocaine (LIDODERM) 5 % Place 1 patch onto the skin daily as needed (For pain.). Remove & Discard patch within 12  hours or as directed by MD   Yes Historical Provider, MD  Lidocaine 0.5 % GEL Apply 1 application topically 3 (three) times daily.   Yes Historical Provider, MD  lisinopril (PRINIVIL,ZESTRIL) 5 MG tablet TAKE 1 TABLET BY MOUTH DAILY 02/04/15  Yes Marletta Lor, MD  LORazepam (ATIVAN) 0.5 MG tablet Take 1 tablet (0.5 mg total) by mouth every 8 (eight) hours as needed. for anxiety Patient taking differently: Take 0.5 mg by mouth every 8 (eight) hours as needed for anxiety.  03/15/16  Yes Marletta Lor, MD  lubiprostone (AMITIZA) 24 MCG capsule Take 1 capsule (24 mcg total) by mouth 2 (two)  times daily with a meal. 03/15/16  Yes Marletta Lor, MD  metolazone (ZAROXOLYN) 2.5 MG tablet Take 2.5 mg every other day 30 min before am Lasix 07/12/15  Yes Rhonda G Barrett, PA-C  oxyCODONE-acetaminophen (PERCOCET) 10-325 MG tablet Take 1 tablet by mouth every 6 (six) hours as needed for pain. 08/31/15  Yes Carmin Muskrat, MD  oxymorphone (OPANA ER) 30 MG 12 hr tablet Take 1 tablet by mouth 2 (two) times daily.  11/07/15  Yes Historical Provider, MD  potassium chloride SA (K-DUR,KLOR-CON) 20 MEQ tablet Take 1 tablet (20 mEq total) by mouth 3 (three) times daily. Patient taking differently: Take 80 mEq by mouth 3 (three) times daily.  03/23/15  Yes Marletta Lor, MD  ranitidine (ZANTAC) 150 MG tablet Take 1 tablet (150 mg total) by mouth at bedtime. 05/31/15  Yes Javier Glazier, MD  tiZANidine (ZANAFLEX) 2 MG tablet Take 2 mg by mouth 3 (three) times daily as needed for muscle spasms.  03/20/16  Yes Historical Provider, MD  triamcinolone cream (KENALOG) 0.1 % Apply 1 application topically 4 (four) times daily.    Yes Historical Provider, MD    Family History Family History  Problem Relation Age of Onset  . Parkinson's disease Mother   . Emphysema Maternal Grandfather   . COPD Maternal Grandfather   . COPD Sister   . COPD Maternal Grandmother   . CAD Neg Hx     Social History Social  History  Substance Use Topics  . Smoking status: Passive Smoke Exposure - Never Smoker  . Smokeless tobacco: Never Used     Comment: Through father  . Alcohol use No     Allergies   Contrast media [iodinated diagnostic agents]; Albuterol; and Prednisone   Review of Systems Review of Systems All other systems reviewed and are negative  Physical Exam Updated Vital Signs BP (!) 131/108   Pulse 114   Resp 18   LMP 01/30/2005   SpO2 95%   Physical Exam  Physical Exam  Nursing note and vitals reviewed. Constitutional: She is oriented to person, place, and time. She appears well-developed and well-nourished. No distress.  HENT:  Head: Normocephalic and atraumatic.  Eyes: Pupils are equal, round, and reactive to light.  Neck: Normal range of motion.  Cardiovascular: Normal rate and intact distal pulses.   Pulmonary/Chest: Scattered expiratory wheezes auscultated in all lung fields posteriorly. Abdominal: Normal appearance. She exhibits no distension.  Musculoskeletal: Normal range of motion.  Neurological: She is alert and oriented to person, place, and time. No cranial nerve deficit.  Skin: Skin is warm and dry. No rash noted.  Psychiatric: She has a normal mood and affect. Her behavior is normal.    ED Treatments / Results  Labs (all labs ordered are listed, but only abnormal results are displayed) Labs Reviewed  COMPREHENSIVE METABOLIC PANEL - Abnormal; Notable for the following:       Result Value   Glucose, Bld 114 (*)    Alkaline Phosphatase 143 (*)    All other components within normal limits  CBC - Abnormal; Notable for the following:    Hemoglobin 10.9 (*)    HCT 35.5 (*)    RDW 16.6 (*)    All other components within normal limits  CULTURE, EXPECTORATED SPUTUM-ASSESSMENT  CULTURE, BLOOD (ROUTINE X 2)  CULTURE, BLOOD (ROUTINE X 2)  GRAM STAIN  BRAIN NATRIURETIC PEPTIDE  HIV ANTIBODY (ROUTINE TESTING)  STREP PNEUMONIAE URINARY ANTIGEN  CBC  CREATININE,  SERUM  Randolm Idol, ED  I-STAT CG4 LACTIC ACID, ED    EKG  EKG Interpretation  Date/Time:  Tuesday April 25 2016 08:52:19 EDT Ventricular Rate:  119 PR Interval:    QRS Duration: 86 QT Interval:  378 QTC Calculation: 532 R Axis:   48 Text Interpretation:  Junctional tachycardia Prolonged QT interval No significant change since last tracing Confirmed by Yazleemar Strassner  MD, Trusten Hume ([id number]) on 04/25/2016 9:01:10 AM       Radiology Dg Chest 2 View  Result Date: 04/25/2016 CLINICAL DATA:  65 year old female with a history of bronchitis EXAM: CHEST  2 VIEW COMPARISON:  08/31/2015, CT chest 12/05/2014 FINDINGS: Cardiomediastinal silhouette unchanged. Double density overlying the lower mediastinum to the left, similar to comparison CT and plain film. Low lung volumes. No confluent airspace disease. Chronic coarsening of the interstitial markings. No pleural effusion or pneumothorax. No displaced fracture. IMPRESSION: Chronic lung changes without evidence of lobar pneumonia. Difficult to exclude developing bronchitis or atypical infection. Large hiatal hernia. Signed, Dulcy Fanny. Earleen Newport, DO [facility] Radiology Electronically Signed   By: Corrie Mckusick D.O.   On: 04/25/2016 09:54    Procedures Procedures (including critical care time)  Medications Ordered in ED Medications  cefTRIAXone (ROCEPHIN) 1 g in dextrose 5 % 50 mL IVPB (not administered)  azithromycin (ZITHROMAX) tablet 500 mg (not administered)  enoxaparin (LOVENOX) injection 40 mg (not administered)  cefTRIAXone (ROCEPHIN) 1 g in dextrose 5 % 50 mL IVPB (not administered)  azithromycin (ZITHROMAX) 500 mg in dextrose 5 % 250 mL IVPB (not administered)  DSS CAPS 100 mg (not administered)  lisinopril (PRINIVIL,ZESTRIL) tablet 5 mg (not administered)  potassium chloride SA (K-DUR,KLOR-CON) CR tablet 80 mEq (not administered)  famotidine (PEPCID) tablet 20 mg (not administered)  metolazone  (ZAROXOLYN) tablet 2.5 mg (not administered)  furosemide (LASIX) tablet 80 mg (not administered)  oxyCODONE-acetaminophen (PERCOCET) 10-325 MG per tablet 1 tablet (not administered)  escitalopram (LEXAPRO) tablet 10 mg (not administered)  cyclobenzaprine (FLEXERIL) tablet 10 mg (not administered)  morphine (MS CONTIN) 12 hr tablet 30 mg (not administered)  lubiprostone (AMITIZA) capsule 24 mcg (not administered)  LORazepam (ATIVAN) tablet 0.5 mg (not administered)  atorvastatin (LIPITOR) tablet 20 mg (not administered)  carvedilol (COREG) tablet 6.25 mg (not administered)  anastrozole (ARIMIDEX) tablet 1 mg (not administered)  albuterol (PROVENTIL HFA;VENTOLIN HFA) 108 (90 Base) MCG/ACT inhaler 2 puff (not administered)  tiZANidine (ZANAFLEX) tablet 2 mg (not administered)  lidocaine (LIDODERM) 5 % 1 patch (not administered)  triamcinolone cream (KENALOG) 0.1 % 1 application (not administered)  Lidocaine 0.5 % GEL 1 application (not administered)  methylPREDNISolone sodium succinate (SOLU-MEDROL) 125 mg/2 mL injection 60 mg (not administered)  albuterol (PROVENTIL) (2.5 MG/3ML) 0.083% nebulizer solution 5 mg (5 mg Nebulization Given 04/25/16 0945)  ipratropium (ATROVENT) nebulizer solution 0.5 mg (0.5 mg Nebulization Given 04/25/16 0945)     Initial Impression / Assessment and Plan / ED Course  I have reviewed the triage vital signs and the nursing notes.  Pertinent labs & imaging results that were available during my care of the patient were reviewed by me and considered in my medical decision making (see chart for details).  Clinical Course    Patient admitted to the hospitalist service.  Requirement of oxygen which is new for her along with antibiotics.  Final Clinical Impressions(s) / ED Diagnoses   Final diagnoses:  Hypoxia  Bronchitis    New Prescriptions New Prescriptions   No medications on file  Leonard Schwartz, MD 04/25/16 1056

## 2016-04-25 NOTE — Telephone Encounter (Signed)
Pt being seen at [facility] ED per chart

## 2016-04-25 NOTE — H&P (Signed)
TRH H&P   Patient Demographics:    Alexandria [name], is a 65 y.o. female  MRN: [id number]   DOB - [birth year]  Admit Date - 04/25/2016  Outpatient Primary MD for the patient is Nyoka Cowden, MD  Outpatient Specialists: Dr Gwenlyn Found - Cards    Patient coming from: Home  Chief Complaint  Patient presents with  . Shortness of Breath  . Chest Pain      HPI:    Alexandria [name]  is a 65 y.o. female, With history of right-sided invasive ductal carcinoma under the care of Dr. Alvy Bimler now considered to be cancer free, COPD not on oxygen, chronic back pain on multiple narcotics, anxiety on benzodiazepine, on diastolic CHF EF 25%, essential hypertension, sinus tachycardia, lymphedema, dyslipidemia, peripheral neuropathy who lives at home and comes to the ER with 5 to seven-day history of productive cough with greenish phlegm, exertional shortness of breath and wheezing. Subjective fevers at home. No chest pain, no palpitations, no other subjective complaints. No exposure to sick contacts or recent travel.  In the ER workup consistent with early bronchitis versus early pneumonia, she also had evidence of wheezing suggesting mild COPD exacerbation with acute hypoxic respiratory failure requiring oxygen and I was called to admit the patient.    Review of systems:    In addition to the HPI above,   No Fever-chills, No Headache, No changes with Vision or hearing, No problems swallowing food or Liquids, No Chest pain,  No Abdominal pain, No Nausea or Vommitting, Bowel movements are regular, No Blood in stool or Urine, No dysuria, No new skin rashes or bruises, No new joints pains-aches,  No new weakness, tingling, numbness in  any extremity, No recent weight gain or loss, No polyuria, polydypsia or polyphagia, No significant Mental Stressors.  A full 10 point Review of Systems was done, except as stated above, all other Review of Systems were negative.   With Past History of the following :    Past Medical History:  Diagnosis Date  . Anxiety   . Breast cancer ([place]) 10/02/12   Invasive Ductal Carcinoma of the Right Upper Outer Quadrant - ER (>90%), PR - Neg., Her2 Neu Negative, Ki-67 Unknown  . Chronic back pain    "all the way up and down"  . Chronic bronchitis ([place])    "get  it q yr" (09/07/2014)  . Complication of anesthesia    "I come out really anxious; need Ativan to ease me out" (09/07/2014)  . Daily headache    "recently" (09/07/2014)  . DDD (degenerative disc disease), cervical   . DDD (degenerative disc disease), lumbosacral   . DDD (degenerative disc disease), thoracolumbar   . Diastolic dysfunction   . Diastolic heart failure ([place])   . Fibromyalgia   . GAD (generalized anxiety disorder)   . H/O hiatal hernia   . History of blood transfusion "3-4"   "never can tell why; don't know where the blood was coming from; doesn't show up in stool or urine"  . Hx of pulmonary embolus    During c-Section  . Hx of radiation therapy 03/04/13- 04/17/13   right breast 50 Gy 25 fractions, right breast boost 10 Gy 5 fractions  . Hyperlipidemia   . Hypertension   . Iron deficiency anemia   . Lymphedema    bilateral lower extremity  . Obesity    Class 2  . On home oxygen therapy    "2L just at night" (09/07/2014)  . Peripheral neuropathic pain ([place])   . Pneumonia    "at least twice/yr" (09/07/2014)  . Recurrent major depression ([place]) 06/2014   Seen by Dr. Louretta Shorten  . S/P radiation therapy  03/04/2013-04/17/2013   1) Right breast / 50 Gy in 25 fractions/ 2) Right breast boost / 10 Gy in 5 fractions  . Status post chemotherapy    4 cycles of Taxotere and cytoxan      Past Surgical History:    Procedure Laterality Date  . ABDOMINAL HYSTERECTOMY    . APPENDECTOMY    . BREAST BIOPSY Right   . BREAST LUMPECTOMY Right   . [place]; 1978; 56;    "stillborn in 44"  . DILATION AND CURETTAGE OF UTERUS  "numerous"  . FOOT SURGERY Right ~ 2013 X 3   "put pins in but pins kept breaking"  . INGUINAL HERNIA REPAIR Bilateral    "cancer"  . PARTIAL COLECTOMY N/A 07/04/2014   Procedure: REPAIR OF INCARCERATED INCISIONAL HERNIA WITH MESH;  Surgeon: Excell Seltzer, MD;  Location: [facility];  Service: General;  Laterality: N/A;  . TONSILLECTOMY    . UMBILICAL HERNIA REPAIR  X 2      Social History:     Social History  Substance Use Topics  . Smoking status: Passive Smoke Exposure - Never Smoker  . Smokeless tobacco: Never Used     Comment: Through father  . Alcohol use No         Family History :     Family History  Problem Relation Age of Onset  . Parkinson's disease Mother   . Emphysema Maternal Grandfather   . COPD Maternal Grandfather   . COPD Sister   . COPD Maternal Grandmother   . CAD Neg Hx        Home Medications:   Prior to Admission medications   Medication Sig Start Date End Date Taking? Authorizing Provider  albuterol (PROVENTIL HFA;VENTOLIN HFA) 108 (90 Base) MCG/ACT inhaler Inhale 2 puffs into the lungs every 6 (six) hours as needed for wheezing or shortness of breath. 04/22/16  Yes Gay Filler Copland, MD  anastrozole (ARIMIDEX) 1 MG tablet Take 1 tablet (1 mg total) by mouth daily. 03/15/16  Yes Marletta Lor, MD  atorvastatin (LIPITOR) 20 MG tablet Take 1 tablet (20 mg total) by mouth daily. 03/15/16  Yes  Marletta Lor, MD  carvedilol (COREG) 6.25 MG tablet Take 1 tablet (6.25 mg total) by mouth 2 (two) times daily with a meal. 03/15/16  Yes Marletta Lor, MD  cyclobenzaprine (FLEXERIL) 10 MG tablet Take 10 mg by mouth at bedtime.  10/06/15  Yes Historical Provider, MD  docusate sodium 100 MG CAPS Take 100 mg by mouth 2  (two) times daily. Patient taking differently: Take 100 mg by mouth daily as needed (mild constipation).  07/10/14  Yes Nat Christen, PA-C  escitalopram (LEXAPRO) 10 MG tablet Take 1 tablet (10 mg total) by mouth 2 (two) times daily. 11/22/15  Yes Maryanna Shape, NP  furosemide (LASIX) 80 MG tablet TAKE 1 TABLET BY MOUTH TWICE DAILY 07/13/15  Yes Rhonda G Barrett, PA-C  lidocaine (LIDODERM) 5 % Place 1 patch onto the skin daily as needed (For pain.). Remove & Discard patch within 12 hours or as directed by MD   Yes Historical Provider, MD  Lidocaine 0.5 % GEL Apply 1 application topically 3 (three) times daily.   Yes Historical Provider, MD  lisinopril (PRINIVIL,ZESTRIL) 5 MG tablet TAKE 1 TABLET BY MOUTH DAILY 02/04/15  Yes Marletta Lor, MD  LORazepam (ATIVAN) 0.5 MG tablet Take 1 tablet (0.5 mg total) by mouth every 8 (eight) hours as needed. for anxiety Patient taking differently: Take 0.5 mg by mouth every 8 (eight) hours as needed for anxiety.  03/15/16  Yes Marletta Lor, MD  lubiprostone (AMITIZA) 24 MCG capsule Take 1 capsule (24 mcg total) by mouth 2 (two) times daily with a meal. 03/15/16  Yes Marletta Lor, MD  metolazone (ZAROXOLYN) 2.5 MG tablet Take 2.5 mg every other day 30 min before am Lasix 07/12/15  Yes Rhonda G Barrett, PA-C  oxyCODONE-acetaminophen (PERCOCET) 10-325 MG tablet Take 1 tablet by mouth every 6 (six) hours as needed for pain. 08/31/15  Yes Carmin Muskrat, MD  oxymorphone (OPANA ER) 30 MG 12 hr tablet Take 1 tablet by mouth 2 (two) times daily.  11/07/15  Yes Historical Provider, MD  potassium chloride SA (K-DUR,KLOR-CON) 20 MEQ tablet Take 1 tablet (20 mEq total) by mouth 3 (three) times daily. Patient taking differently: Take 80 mEq by mouth 3 (three) times daily.  03/23/15  Yes Marletta Lor, MD  ranitidine (ZANTAC) 150 MG tablet Take 1 tablet (150 mg total) by mouth at bedtime. 05/31/15  Yes Javier Glazier, MD  tiZANidine (ZANAFLEX) 2 MG  tablet Take 2 mg by mouth 3 (three) times daily as needed for muscle spasms.  03/20/16  Yes Historical Provider, MD  triamcinolone cream (KENALOG) 0.1 % Apply 1 application topically 4 (four) times daily.    Yes Historical Provider, MD     Allergies:     Allergies  Allergen Reactions  . Contrast Media [Iodinated Diagnostic Agents] Anaphylaxis  . Albuterol Other (See Comments)    Anxious   . Prednisone Other (See Comments)    Pt gets very agitated when she takes high doses of steroids     Physical Exam:   Vitals  Blood pressure 115/88, pulse 112, resp. rate 18, last menstrual period 01/30/2005, SpO2 94 %.   1. General Obese middle-aged white female lying in bed in NAD,    2. Normal affect and insight, Not Suicidal or Homicidal, Awake Alert, Oriented X 3.  3. No F.N deficits, ALL C.Nerves Intact, Strength 5/5 all 4 extremities, Sensation intact all 4 extremities, Plantars down going.  4. Ears and Eyes appear  Normal, Conjunctivae clear, PERRLA. Moist Oral Mucosa.  5. Supple Neck, No JVD, No cervical lymphadenopathy appriciated, No Carotid Bruits.  6. Symmetrical Chest wall movement, moderate air movement bilaterally, few wheezes  7. RRR, No Gallops, Rubs or Murmurs, No Parasternal Heave.  8. Positive Bowel Sounds, Abdomen Soft, No tenderness, No organomegaly appriciated,No rebound -guarding or rigidity.  9.  No Cyanosis, Normal Skin Turgor, No Skin Rash or Bruise. Trace lower extremity edema  10. Good muscle tone,  joints appear normal , no effusions, Normal ROM.  11. No Palpable Lymph Nodes in Neck or Axillae      Data Review:    CBC  Recent Labs Lab 04/25/16 0921  WBC 7.3  HGB 10.9*  HCT 35.5*  PLT 236  MCV 85.3  MCH 26.2  MCHC 30.7  RDW 16.6*   ------------------------------------------------------------------------------------------------------------------  Chemistries   Recent Labs Lab 04/25/16 0921  NA 137  K 4.0  CL 104  CO2 26  GLUCOSE 114*   BUN 14  CREATININE 0.83  CALCIUM 9.1  AST 25  ALT 24  ALKPHOS 143*  BILITOT 0.6   ------------------------------------------------------------------------------------------------------------------ CrCl cannot be calculated (Unknown ideal weight.). ------------------------------------------------------------------------------------------------------------------ No results for input(s): TSH, T4TOTAL, T3FREE, THYROIDAB in the last 72 hours.  Invalid input(s): FREET3  Coagulation profile No results for input(s): INR, PROTIME in the last 168 hours. ------------------------------------------------------------------------------------------------------------------- No results for input(s): DDIMER in the last 72 hours. -------------------------------------------------------------------------------------------------------------------  Cardiac Enzymes No results for input(s): CKMB, TROPONINI, MYOGLOBIN in the last 168 hours.  Invalid input(s): CK ------------------------------------------------------------------------------------------------------------------    Component Value Date/Time   BNP 88.8 04/25/2016 0921     ---------------------------------------------------------------------------------------------------------------  Urinalysis    Component Value Date/Time   COLORURINE YELLOW 12/04/2014 1928   APPEARANCEUR CLEAR 12/04/2014 1928   LABSPEC 1.018 12/04/2014 1928   PHURINE 5.5 12/04/2014 1928   GLUCOSEU NEGATIVE 12/04/2014 1928   HGBUR NEGATIVE 12/04/2014 1928   BILIRUBINUR NEGATIVE 12/04/2014 1928   KETONESUR NEGATIVE 12/04/2014 1928   PROTEINUR NEGATIVE 12/04/2014 1928   UROBILINOGEN 0.2 12/04/2014 1928   NITRITE NEGATIVE 12/04/2014 1928   LEUKOCYTESUR NEGATIVE 12/04/2014 1928    ----------------------------------------------------------------------------------------------------------------   Imaging Results:    Dg Chest 2 View  Result Date:  04/25/2016 CLINICAL DATA:  65 year old female with a history of bronchitis EXAM: CHEST  2 VIEW COMPARISON:  08/31/2015, CT chest 12/05/2014 FINDINGS: Cardiomediastinal silhouette unchanged. Double density overlying the lower mediastinum to the left, similar to comparison CT and plain film. Low lung volumes. No confluent airspace disease. Chronic coarsening of the interstitial markings. No pleural effusion or pneumothorax. No displaced fracture. IMPRESSION: Chronic lung changes without evidence of lobar pneumonia. Difficult to exclude developing bronchitis or atypical infection. Large hiatal hernia. Signed, Dulcy Fanny. Earleen Newport, DO [facility] Radiology Electronically Signed   By: Corrie Mckusick D.O.   On: 04/25/2016 09:54    My personal review of EKG: Rhythm Junctional tachycardia,  no Acute ST changes   Assessment & Plan:     1. Acute hypoxic respiratory failure requiring oxygen due to mild COPD exacerbation complicated by bronchitis versus chest x-ray negative early CAP. Patient will be admitted to a telemetry bed, sputum Gram stain culture, blood cultures, IV Rocephin and azithromycin, IV steroids, oxygen and nebulizer treatments. Add flutter valve and encouraged to sit up in the chair.  2. History of SVT and sinus tachycardia. Check TSH, continue Coreg and telemetry monitor.  3. Essential hypertension. Continue home medications.  4. Dyslipidemia. On statin continue  5.  HX of chronic diastolic dysfunction and lymphedema EF of 60%. Currently compensated continue home dose of diuretics and potassium supplementation. BNP is under 100.  6. Chronic pain with peripheral neuropathy. Ambulates with a cane. She is on 2 narcotics and when benzodiazepine, as been counseled not to overuse, request PCP to gently taper off narcotics for chronic pain. Husband counseled as well.  7. Right-sided invasive ductal carcinoma of the breast. Currently presumed treated under  the care of Dr. Alvy Bimler, requested to follow with oncology outpatient after discharge.  8. Reflux. Continue PPI.   DVT Prophylaxis  Lovenox   AM Labs Ordered, also please review Full Orders  Family Communication: Admission, patients condition and plan of care including tests being ordered have been discussed with the patient and Husband who indicate understanding and agree with the plan and Code Status.  Code Status full  Likely DC to  home in 1-2 days  Condition GUARDED    Consults called: None    Admission status: Inpatient    Time spent in minutes : 35   Laken Lobato K M.D on 04/25/2016 at 11:03 AM  Between 7am to 7pm - Pager - [phone]. After 7pm go to [email or web address] - password [facility]  [facility] - Office  [phone]

## 2016-04-26 LAB — BASIC METABOLIC PANEL
Anion gap: 12 (ref 5–15)
BUN: 16 mg/dL (ref 6–20)
CHLORIDE: 98 mmol/L — AB (ref 101–111)
CO2: 29 mmol/L (ref 22–32)
CREATININE: 0.88 mg/dL (ref 0.44–1.00)
Calcium: 9.3 mg/dL (ref 8.9–10.3)
GFR calc non Af Amer: >60 mL/min (ref >60)
Glucose, Bld: 179 mg/dL — ABNORMAL HIGH (ref 65–99)
POTASSIUM: 2.9 mmol/L — AB (ref 3.5–5.1)
Sodium: 139 mmol/L (ref 135–145)

## 2016-04-26 LAB — CBC
HEMATOCRIT: 36.6 % (ref 36.0–46.0)
HEMOGLOBIN: 11.5 g/dL — AB (ref 12.0–15.0)
MCH: 26.1 pg (ref 26.0–34.0)
MCHC: 31.4 g/dL (ref 30.0–36.0)
MCV: 83.2 fL (ref 78.0–100.0)
Platelets: 261 10*3/uL (ref 150–400)
RBC: 4.4 MIL/uL (ref 3.87–5.11)
RDW: 15.9 % — ABNORMAL HIGH (ref 11.5–15.5)
WBC: 6.9 10*3/uL (ref 4.0–10.5)

## 2016-04-26 LAB — HIV ANTIBODY (ROUTINE TESTING W REFLEX): HIV Screen 4th Generation wRfx: NONREACTIVE

## 2016-04-26 LAB — TSH: TSH: 1 u[IU]/mL (ref 0.350–4.500)

## 2016-04-26 MED ORDER — LISINOPRIL 5 MG PO TABS
2.5000 mg | ORAL_TABLET | Freq: Every day | ORAL | Status: DC
  Administered 2016-04-26: 2.5 mg via ORAL
  Filled 2016-04-26: qty 0.5

## 2016-04-26 MED ORDER — POTASSIUM CHLORIDE CRYS ER 20 MEQ PO TBCR: 20.0000 meq | EXTENDED_RELEASE_TABLET | Freq: Two times a day (BID) | ORAL | Status: DC

## 2016-04-26 MED ORDER — ENOXAPARIN SODIUM 40 MG/0.4ML ~~LOC~~ SOLN
40.0000 mg | SUBCUTANEOUS | Status: DC
  Filled 2016-04-26: qty 0.4

## 2016-04-26 MED ORDER — FUROSEMIDE 40 MG PO TABS
40.0000 mg | ORAL_TABLET | Freq: Two times a day (BID) | ORAL | Status: DC
  Filled 2016-04-26: qty 1

## 2016-04-26 MED ORDER — DIPHENHYDRAMINE HCL 50 MG/ML IJ SOLN
INTRAMUSCULAR | Status: AC
Start: 2016-04-26 — End: 2016-04-27
  Filled 2016-04-26: qty 1

## 2016-04-26 MED ORDER — MAGNESIUM SULFATE IN D5W 1-5 GM/100ML-% IV SOLN
1.0000 g | Freq: Once | INTRAVENOUS | Status: AC
  Administered 2016-04-26: 1 g via INTRAVENOUS
  Filled 2016-04-26: qty 100

## 2016-04-26 MED ORDER — DIPHENHYDRAMINE HCL 50 MG/ML IJ SOLN
25.0000 mg | Freq: Three times a day (TID) | INTRAMUSCULAR | Status: DC | PRN
  Administered 2016-04-26 (×2): 25 mg via INTRAVENOUS
  Filled 2016-04-26: qty 1

## 2016-04-26 MED ORDER — CARVEDILOL 6.25 MG PO TABS
9.3000 mg | ORAL_TABLET | Freq: Two times a day (BID) | ORAL | Status: DC
  Administered 2016-04-26 – 2016-04-27 (×3): 9.375 mg via ORAL
  Filled 2016-04-26 (×3): qty 1

## 2016-04-26 MED ORDER — POTASSIUM CHLORIDE CRYS ER 20 MEQ PO TBCR
40.0000 meq | EXTENDED_RELEASE_TABLET | Freq: Four times a day (QID) | ORAL | Status: AC
  Administered 2016-04-26 (×3): 40 meq via ORAL
  Filled 2016-04-26 (×3): qty 2

## 2016-04-26 NOTE — Progress Notes (Signed)
PT Cancellation Note  Patient Details Name: Alexandria [name] MRN: [id number] DOB: 1951/01/15   Cancelled Treatment:    Reason Eval/Treat Not Completed: PT screened, no needs identified, will sign off. Spoke with pt who reports she has been ambulating in hallways without difficulty. She denies need for PT services. Will sign off    Weston Anna, MPT Pager: [phone]

## 2016-04-26 NOTE — Progress Notes (Addendum)
PROGRESS NOTE                                                                                                                                                                                                             Patient Demographics:    Alexandria [name], is a 65 y.o. female, DOB - [birth year], [id number]  Admit date - 04/25/2016   Admitting Physician Thurnell Lose, MD  Outpatient Primary MD for the patient is Nyoka Cowden, MD  LOS - 1  Chief Complaint  Patient presents with  . Shortness of Breath  . Chest Pain       Brief Narrative     Alexandria [name]  is a 65 y.o. female, With history of right-sided invasive ductal carcinoma under the care of Dr. Alvy Bimler now considered to be cancer free, COPD not on oxygen, chronic back pain on multiple narcotics, anxiety on benzodiazepine, on diastolic CHF EF [id number], essential hypertension, sinus tachycardia, lymphedema, dyslipidemia, peripheral neuropathy who lives at home and comes to the ER with 5 to seven-day history of productive cough with greenish phlegm, exertional shortness of breath and wheezing. Subjective fevers at home. No chest pain, no palpitations, no other subjective complaints. No exposure to sick contacts or recent travel.  In the ER workup consistent with early bronchitis versus early pneumonia, she also had evidence of wheezing suggesting mild COPD exacerbation with acute hypoxic respiratory failure requiring oxygen and I was called to admit the patient.    Subjective:    Alexandria [name] today has, No headache, No chest pain, No abdominal pain - No Nausea, No new weakness tingling or numbness, No Cough - SOB. She feels much better this morning.   Assessment  & Plan :      1. Acute hypoxic respiratory failure requiring oxygen due to mild COPD exacerbation complicated by bronchitis versus chest x-ray negative early CAP. She is much better on empiric IV  antibiotics which are Rocephin and doxycycline, on IV steroids along with nebulizer treatments, now she is off of oxygen, continue flutter valve and encouraged to sit up in the chair. Agrees activity likely discharge in the morning if stable. Follow culture results.  2. History of SVT and sinus tachycardia. Check TSH, increase Coreg dose and telemetry monitor.  3. Essential hypertension. Blood pressure medications adjusted for better heart rate control, Coreg  dose increased and ACE inhibitor dose dropped.  4. Dyslipidemia. On statin continue  5. HX of chronic diastolic dysfunction and lymphedema EF of 60%. Currently compensated continue home dose of diuretics and potassium supplementation. BNP is under 100.  6. Chronic pain with peripheral neuropathy. Ambulates with a cane. She is on 2 narcotics and when benzodiazepine, as been counseled not to overuse, request PCP to gently taper off narcotics for chronic pain. Husband counseled as well.  7. Right-sided invasive ductal carcinoma of the breast. Currently presumed treated under the care of Dr. Alvy Bimler, requested to follow with oncology outpatient after discharge.  8. Reflux. Continue PPI.  9. Hypokalemia. Replaced. We will monitor.   Family Communication  :  Husband on the day of admission bedside  Code Status :  Full  Diet : Heart Healthy  Disposition Plan  :  Home in am  Consults  :     Procedures  :     DVT Prophylaxis  :  Lovenox   Lab Results  Component Value Date   PLT 261 04/26/2016    Inpatient Medications  Scheduled Meds: . anastrozole  1 mg Oral Daily  . atorvastatin  20 mg Oral q1800  . carvedilol  9.375 mg Oral BID WC  . cefTRIAXone (ROCEPHIN)  IV  1 g Intravenous Q24H  . cyclobenzaprine  10 mg Oral QHS  . docusate sodium  200 mg Oral BID  . doxycycline  100 mg Oral Q12H  . escitalopram  10 mg Oral Daily  . famotidine  20 mg Oral QHS  . furosemide  80 mg Oral BID  . lidocaine   Topical TID  .  lisinopril  2.5 mg Oral Daily  . lubiprostone  24 mcg Oral BID WC  . methylPREDNISolone (SOLU-MEDROL) injection  60 mg Intravenous TID  . metolazone  2.5 mg Oral Once  . morphine  30 mg Oral Q12H  . potassium chloride SA  20 mEq Oral BID  . triamcinolone cream  1 application Topical QID   Continuous Infusions:  PRN Meds:.acetaminophen, albuterol, guaiFENesin-dextromethorphan, LORazepam, oxyCODONE-acetaminophen **AND** [DISCONTINUED] oxyCODONE, promethazine  Antibiotics  :    Anti-infectives    Start     Dose/Rate Route Frequency Ordered Stop   04/26/16 1100  cefTRIAXone (ROCEPHIN) 1 g in dextrose 5 % 50 mL IVPB     1 g 100 mL/hr over 30 Minutes Intravenous Every 24 hours 04/25/16 1045 05/03/16 1059   04/26/16 1000  doxycycline (VIBRA-TABS) tablet 100 mg     100 mg Oral Every 12 hours 04/25/16 1445 05/03/16 0959   04/25/16 1100  azithromycin (ZITHROMAX) 500 mg in dextrose 5 % 250 mL IVPB  Status:  Discontinued     500 mg 250 mL/hr over 60 Minutes Intravenous Every 24 hours 04/25/16 1045 04/25/16 1445   04/25/16 1030  cefTRIAXone (ROCEPHIN) 1 g in dextrose 5 % 50 mL IVPB     1 g 100 mL/hr over 30 Minutes Intravenous  Once 04/25/16 1026 04/25/16 1142   04/25/16 1030  azithromycin (ZITHROMAX) tablet 500 mg     500 mg Oral  Once 04/25/16 1026 04/25/16 1112         Objective:   Vitals:   04/25/16 1600 04/25/16 1830 04/25/16 2222 04/26/16 0620  BP: (!) 141/91  (!) 132/96 117/72  Pulse: (!) 114  90 (!) 114  Resp: 20  20 18   Temp: 98.7 F (37.1 C)  98.1 F (36.7 C) 97.7 F (36.5 C)  TempSrc: Oral  Oral Oral  SpO2: 94%  96% 94%  Weight:  103.4 kg (228 lb)    Height:  5\' 6"  (1.676 m)      Wt Readings from Last 3 Encounters:  04/25/16 103.4 kg (228 lb)  03/15/16 109.8 kg (242 lb)  11/22/15 112.9 kg (248 lb 12.8 oz)    No intake or output data in the 24 hours ending 04/26/16 0759   Physical Exam  Awake Alert, Oriented X 3, No new F.N deficits, Normal  affect [place].AT,PERRAL Supple Neck,No JVD, No cervical lymphadenopathy appriciated.  Symmetrical Chest wall movement, Good air movement bilaterally, CTAB RRR,No Gallops,Rubs or new Murmurs, No Parasternal Heave +ve B.Sounds, Abd Soft, No tenderness, No organomegaly appriciated, No rebound - guarding or rigidity. No Cyanosis, Clubbing or edema, No new Rash or bruise      Data Review:    CBC  Recent Labs Lab 04/25/16 0921 04/26/16 0559  WBC 7.3 6.9  HGB 10.9* 11.5*  HCT 35.5* 36.6  PLT 236 261  MCV 85.3 83.2  MCH 26.2 26.1  MCHC 30.7 31.4  RDW 16.6* 15.9*    Chemistries   Recent Labs Lab 04/25/16 0921 04/26/16 0559  NA 137 139  K 4.0 2.9*  CL 104 98*  CO2 26 29  GLUCOSE 114* 179*  BUN 14 16  CREATININE 0.83 0.88  CALCIUM 9.1 9.3  AST 25  --   ALT 24  --   ALKPHOS 143*  --   BILITOT 0.6  --    ------------------------------------------------------------------------------------------------------------------ No results for input(s): CHOL, HDL, LDLCALC, TRIG, CHOLHDL, LDLDIRECT in the last 72 hours.  No results found for: HGBA1C ------------------------------------------------------------------------------------------------------------------ No results for input(s): TSH, T4TOTAL, T3FREE, THYROIDAB in the last 72 hours.  Invalid input(s): FREET3 ------------------------------------------------------------------------------------------------------------------ No results for input(s): VITAMINB12, FOLATE, FERRITIN, TIBC, IRON, RETICCTPCT in the last 72 hours.  Coagulation profile No results for input(s): INR, PROTIME in the last 168 hours.  No results for input(s): DDIMER in the last 72 hours.  Cardiac Enzymes No results for input(s): CKMB, TROPONINI, MYOGLOBIN in the last 168 hours.  Invalid input(s): CK ------------------------------------------------------------------------------------------------------------------    Component Value Date/Time   BNP 88.8  04/25/2016 [id number]    Micro Results Recent Results (from the past 240 hour(s))  Culture, blood (routine x 2) Call MD if unable to obtain prior to antibiotics being given     Status: None (Preliminary result)   Collection Time: 04/25/16 11:11 AM  Result Value Ref Range Status   Specimen Description BLOOD LEFT WRIST  Final   Special Requests BOTTLES DRAWN AEROBIC AND ANAEROBIC 5CC EACH  Final   Culture   Final    NO GROWTH < 12 HOURS Performed at [facility]    Report Status PENDING  Incomplete    Radiology Reports Dg Chest 2 View  Result Date: 04/25/2016 CLINICAL DATA:  65 year old female with a history of bronchitis EXAM: CHEST  2 VIEW COMPARISON:  08/31/2015, CT chest 12/05/2014 FINDINGS: Cardiomediastinal silhouette unchanged. Double density overlying the lower mediastinum to the left, similar to comparison CT and plain film. Low lung volumes. No confluent airspace disease. Chronic coarsening of the interstitial markings. No pleural effusion or pneumothorax. No displaced fracture. IMPRESSION: Chronic lung changes without evidence of lobar pneumonia. Difficult to exclude developing bronchitis or atypical infection. Large hiatal hernia. Signed, Dulcy Fanny. Earleen Newport, DO [facility] Radiology Electronically Signed   By: Corrie Mckusick D.O.   On: 04/25/2016 09:54    Time Spent in  minutes  30   Alexandria [name] K M.D on 04/26/2016 at 7:59 AM  Between 7am to 7pm - Pager - [phone]  After 7pm go to [email or web address] - password [facility]  [facility] -  Office  [phone]

## 2016-04-27 LAB — MAGNESIUM: Magnesium: 2.1 mg/dL (ref 1.7–2.4)

## 2016-04-27 LAB — POTASSIUM: Potassium: 3.3 mmol/L — ABNORMAL LOW (ref 3.5–5.1)

## 2016-04-27 MED ORDER — GUAIFENESIN-DM 100-10 MG/5ML PO SYRP: 10.0000 mL | ORAL_SOLUTION | ORAL | 0 refills | Status: DC | PRN

## 2016-04-27 MED ORDER — DOXYCYCLINE HYCLATE 100 MG PO TABS: 100.0000 mg | ORAL_TABLET | Freq: Two times a day (BID) | ORAL | 0 refills | Status: DC

## 2016-04-27 MED ORDER — FUROSEMIDE 40 MG PO TABS: 40.0000 mg | ORAL_TABLET | Freq: Two times a day (BID) | ORAL | Status: DC

## 2016-04-27 MED ORDER — POTASSIUM CHLORIDE CRYS ER 20 MEQ PO TBCR: 20.0000 meq | EXTENDED_RELEASE_TABLET | Freq: Two times a day (BID) | ORAL | Status: DC

## 2016-04-27 MED ORDER — METOLAZONE 2.5 MG PO TABS: 2.5000 mg | ORAL_TABLET | Freq: Every day | ORAL | Status: AC | PRN

## 2016-04-27 MED ORDER — CEFPODOXIME PROXETIL 200 MG PO TABS: 200.0000 mg | ORAL_TABLET | Freq: Two times a day (BID) | ORAL | 0 refills | Status: DC

## 2016-04-27 MED ORDER — POTASSIUM CHLORIDE CRYS ER 20 MEQ PO TBCR
40.0000 meq | EXTENDED_RELEASE_TABLET | Freq: Four times a day (QID) | ORAL | Status: DC
  Administered 2016-04-27: 40 meq via ORAL
  Filled 2016-04-27: qty 2

## 2016-04-27 NOTE — Discharge Instructions (Signed)
Follow with Primary MD Nyoka Cowden, MD in 7 days   Get CBC, CMP, 2 view Chest X ray checked  by Primary MD or SNF MD in 5-7 days ( we routinely change or add medications that can affect your baseline labs and fluid status, therefore we recommend that you get the mentioned basic workup next visit with your PCP, your PCP may decide not to get them or add new tests based on their clinical decision)   Activity: As tolerated with Full fall precautions use walker/cane & assistance as needed   Disposition Home     Diet:   Heart Healthy  Check your Weight same time everyday, if you gain over 2 pounds, or you develop in leg swelling, experience more shortness of breath or chest pain, call your Primary MD immediately. Follow Cardiac Low Salt Diet and 1.5 lit/day fluid restriction.   On your next visit with your primary care physician please Get Medicines reviewed and adjusted.   Please request your Prim.MD to go over all Hospital Tests and Procedure/Radiological results at the follow up, please get all Hospital records sent to your Prim MD by signing hospital release before you go home.   If you experience worsening of your admission symptoms, develop shortness of breath, life threatening emergency, suicidal or homicidal thoughts you must seek medical attention immediately by calling 911 or calling your MD immediately  if symptoms less severe.  You Must read complete instructions/literature along with all the possible adverse reactions/side effects for all the Medicines you take and that have been prescribed to you. Take any new Medicines after you have completely understood and accpet all the possible adverse reactions/side effects.   Do not drive, operate heavy machinery, perform activities at heights, swimming or participation in water activities or provide baby sitting services if your were admitted for syncope or siezures until you have seen by Primary MD or a Neurologist and advised  to do so again.  Do not drive when taking Pain medications.    Do not take more than prescribed Pain, Sleep and Anxiety Medications  Special Instructions: If you have smoked or chewed Tobacco  in the last 2 yrs please stop smoking, stop any regular Alcohol  and or any Recreational drug use.  Wear Seat belts while driving.   Please note  You were cared for by a hospitalist during your hospital stay. If you have any questions about your discharge medications or the care you received while you were in the hospital after you are discharged, you can call the unit and asked to speak with the hospitalist on call if the hospitalist that took care of you is not available. Once you are discharged, your primary care physician will handle any further medical issues. Please note that NO REFILLS for any discharge medications will be authorized once you are discharged, as it is imperative that you return to your primary care physician (or establish a relationship with a primary care physician if you do not have one) for your aftercare needs so that they can reassess your need for medications and monitor your lab values.

## 2016-04-27 NOTE — Discharge Summary (Signed)
Alexandria [name] [id number] DOB: [birth year] DOA: 04/25/2016  PCP: Nyoka Cowden, MD  Admit date: 04/25/2016  Discharge date: 04/27/2016  Admitted From: Home   Disposition:  Home   Recommendations for Outpatient Follow-up:   Follow up with PCP in 1-2 weeks  PCP Please obtain BMP/CBC, 2 view CXR in 1week,  (see Discharge instructions)   PCP Please follow up on the following pending results: None   Home Health: None   Equipment/Devices: None  Consultations: None Discharge Condition: Stable   CODE STATUS: Full   Diet Recommendation: Heart healthy with 1.5 L total fluid restriction per day   Chief Complaint  Patient presents with  . Shortness of Breath  . Chest Pain     Brief history of present illness from the day of admission and additional interim summary    Alexandria [name] a 65 y.o.female,With history of right-sided invasive ductal carcinoma under the care of Dr. Alvy Bimler now considered to be cancer free, COPD not on oxygen, chronic back pain on multiple narcotics, anxiety on benzodiazepine, on diastolic CHF EF [id number], essential hypertension, sinus tachycardia, lymphedema, dyslipidemia, peripheral neuropathy who lives at home and comes to the ER with 5 to seven-day history of productive cough with greenish phlegm, exertional shortness of breath and wheezing. Subjective fevers at home. No chest pain, no palpitations, no other subjective complaints. No exposure to sick contacts or recent travel.  In the ER workup consistent with early bronchitis versus early pneumonia, she also had evidence of wheezing suggesting mild COPD exacerbation with acute hypoxic respiratory failure requiring oxygen and I was called to admit the patient.   Hospital issues addressed    1. Acute hypoxic respiratory failure  requiring oxygen due to mild COPD exacerbation complicated by bronchitis versus chest x-ray negative early CAP. She is much better on empiric IV antibiotics which are Rocephin and doxycycline, He was given a dose of steroids however she was allergic to it so it was stopped, flutter valve was added, she became oxygen free yesterday and this morning she is completely symptom free with no cough or shortness of breath. She will be transitioned to 5 more days of oral antibiotics and discharged home to follow with PCP within a week. Request PCP to check CBC, BMP and a 2 view chest x-ray next visit.  2.History of SVT and sinus tachycardia. Stable here, continue beta blocker at home dose.  3.Essential hypertension. Continue home regimen.  4.Dyslipidemia. On statin continue  5. HXof chronic diastolic dysfunction and lymphedema EFof 60%. Currently compensated continue home dose of diuretics and potassium supplementation. BNP was under 100.  6.Chronic pain with peripheral neuropathy. Ambulates with a cane. She is on 2narcotics and when benzodiazepine, as been counseled not to overuse, request PCP to gently taper off narcotics for chronic pain. Husband counseled as well.  7.Right-sided invasive ductal carcinoma of the breast. Currently presumed treated under the care of Dr. Alvy Bimler, requested to follow with oncology outpatient after discharge.  8.Reflux. Continue PPI.  9. Hypokalemia. Replaced. With PCP to recheck BMP  next visit.   Discharge diagnosis     Principal Problem:   CAP (community acquired pneumonia) Active Problems:   Breast cancer, right breast (HCC)   Chronic back pain   Adjustment disorder with mixed anxiety and depressed mood   Vocal cord dysfunction   SVT (supraventricular tachycardia) (HCC)   Hypoxia   Hyperlipidemia   Chronic bronchitis ([place])    Discharge instructions    Discharge Instructions    Discharge instructions    Complete by:  As directed    Follow with Primary MD Nyoka Cowden, MD in 7 days   Get CBC, CMP, 2 view Chest X ray checked  by Primary MD or SNF MD in 5-7 days ( we routinely change or add medications that can affect your baseline labs and fluid status, therefore we recommend that you get the mentioned basic workup next visit with your PCP, your PCP may decide not to get them or add new tests based on their clinical decision)   Activity: As tolerated with Full fall precautions use walker/cane & assistance as needed   Disposition Home     Diet:   Heart Healthy  Check your Weight same time everyday, if you gain over 2 pounds, or you develop in leg swelling, experience more shortness of breath or chest pain, call your Primary MD immediately. Follow Cardiac Low Salt Diet and 1.5 lit/day fluid restriction.   On your next visit with your primary care physician please Get Medicines reviewed and adjusted.   Please request your Prim.MD to go over all Hospital Tests and Procedure/Radiological results at the follow up, please get all Hospital records sent to your Prim MD by signing hospital release before you go home.   If you experience worsening of your admission symptoms, develop shortness of breath, life threatening emergency, suicidal or homicidal thoughts you must seek medical attention immediately by calling 911 or calling your MD immediately  if symptoms less severe.  You Must read complete instructions/literature along with all the possible adverse reactions/side effects for all the Medicines you take and that have been prescribed to you. Take any new Medicines after you have completely understood and accpet all the possible adverse reactions/side effects.   Do not drive, operate heavy machinery, perform activities at heights, swimming or participation in water activities or provide baby sitting services if your were admitted for syncope or siezures until you have seen by Primary MD or a Neurologist and advised  to do so again.  Do not drive when taking Pain medications.    Do not take more than prescribed Pain, Sleep and Anxiety Medications  Special Instructions: If you have smoked or chewed Tobacco  in the last 2 yrs please stop smoking, stop any regular Alcohol  and or any Recreational drug use.  Wear Seat belts while driving.   Please note  You were cared for by a hospitalist during your hospital stay. If you have any questions about your discharge medications or the care you received while you were in the hospital after you are discharged, you can call the unit and asked to speak with the hospitalist on call if the hospitalist that took care of you is not available. Once you are discharged, your primary care physician will handle any further medical issues. Please note that NO REFILLS for any discharge medications will be authorized once you are discharged, as it is imperative that you return to your primary care physician (or establish a relationship with a primary care physician if  you do not have one) for your aftercare needs so that they can reassess your need for medications and monitor your lab values.   Increase activity slowly    Complete by:  As directed      Discharge Medications     Medication List    STOP taking these medications   tiZANidine 2 MG tablet Commonly known as:  ZANAFLEX     TAKE these medications   albuterol 108 (90 Base) MCG/ACT inhaler Commonly known as:  PROVENTIL HFA;VENTOLIN HFA Inhale 2 puffs into the lungs every 6 (six) hours as needed for wheezing or shortness of breath.   anastrozole 1 MG tablet Commonly known as:  ARIMIDEX Take 1 tablet (1 mg total) by mouth daily.   atorvastatin 20 MG tablet Commonly known as:  LIPITOR Take 1 tablet (20 mg total) by mouth daily.   carvedilol 6.25 MG tablet Commonly known as:  COREG Take 1 tablet (6.25 mg total) by mouth 2 (two) times daily with a meal.   cefpodoxime 200 MG tablet Commonly known as:   VANTIN Take 1 tablet (200 mg total) by mouth 2 (two) times daily.   cyclobenzaprine 10 MG tablet Commonly known as:  FLEXERIL Take 10 mg by mouth at bedtime.   doxycycline 100 MG tablet Commonly known as:  VIBRA-TABS Take 1 tablet (100 mg total) by mouth every 12 (twelve) hours.   DSS 100 MG Caps Take 100 mg by mouth 2 (two) times daily. What changed:  when to take this  reasons to take this   escitalopram 10 MG tablet Commonly known as:  LEXAPRO Take 1 tablet (10 mg total) by mouth 2 (two) times daily.   furosemide 80 MG tablet Commonly known as:  LASIX TAKE 1 TABLET BY MOUTH TWICE DAILY What changed:  See the new instructions.   guaiFENesin-dextromethorphan 100-10 MG/5ML syrup Commonly known as:  ROBITUSSIN DM Take 10 mLs by mouth every 4 (four) hours as needed for cough.   lidocaine 5 % Commonly known as:  LIDODERM Place 1 patch onto the skin daily as needed (For pain.). Remove & Discard patch within 12 hours or as directed by MD   Lidocaine 0.5 % Gel Apply 1 application topically 3 (three) times daily.   lisinopril 5 MG tablet Commonly known as:  PRINIVIL,ZESTRIL TAKE 1 TABLET BY MOUTH DAILY What changed:  See the new instructions.   LORazepam 0.5 MG tablet Commonly known as:  ATIVAN Take 1 tablet (0.5 mg total) by mouth every 8 (eight) hours as needed. for anxiety What changed:  reasons to take this  additional instructions   lubiprostone 24 MCG capsule Commonly known as:  AMITIZA Take 1 capsule (24 mcg total) by mouth 2 (two) times daily with a meal.   metolazone 2.5 MG tablet Commonly known as:  ZAROXOLYN Take 1 tablet (2.5 mg total) by mouth daily as needed (fluid gain).   oxyCODONE-acetaminophen 10-325 MG tablet Commonly known as:  PERCOCET Take 1 tablet by mouth every 6 (six) hours as needed for pain.   oxymorphone 30 MG 12 hr tablet Commonly known as:  OPANA ER Take 1 tablet by mouth 2 (two) times daily.   potassium chloride SA 20 MEQ  tablet Commonly known as:  K-DUR,KLOR-CON Take 1 tablet (20 mEq total) by mouth 3 (three) times daily.   ranitidine 150 MG tablet Commonly known as:  ZANTAC Take 1 tablet (150 mg total) by mouth at bedtime.       Follow-up Information  Nyoka Cowden, MD. Schedule an appointment as soon as possible for a visit in 1 week(s).   Specialty:  Internal Medicine Contact information: [place] [address] [phone]           Major procedures and Radiology Reports - PLEASE review detailed and final reports thoroughly  -        Dg Chest 2 View  Result Date: 04/25/2016 CLINICAL DATA:  65 year old female with a history of bronchitis EXAM: CHEST  2 VIEW COMPARISON:  08/31/2015, CT chest 12/05/2014 FINDINGS: Cardiomediastinal silhouette unchanged. Double density overlying the lower mediastinum to the left, similar to comparison CT and plain film. Low lung volumes. No confluent airspace disease. Chronic coarsening of the interstitial markings. No pleural effusion or pneumothorax. No displaced fracture. IMPRESSION: Chronic lung changes without evidence of lobar pneumonia. Difficult to exclude developing bronchitis or atypical infection. Large hiatal hernia. Signed, Dulcy Fanny. Earleen Newport, DO [facility] Radiology Electronically Signed   By: Corrie Mckusick D.O.   On: 04/25/2016 09:54    Micro Results    Recent Results (from the past 240 hour(s))  Culture, blood (routine x 2) Call MD if unable to obtain prior to antibiotics being given     Status: None (Preliminary result)   Collection Time: 04/25/16 11:11 AM  Result Value Ref Range Status   Specimen Description BLOOD LEFT WRIST  Final   Special Requests BOTTLES DRAWN AEROBIC AND ANAEROBIC 5CC EACH  Final   Culture   Final    NO GROWTH 1 DAY Performed at [facility]    Report Status PENDING  Incomplete  Culture, blood (routine x 2) Call MD if unable to  obtain prior to antibiotics being given     Status: None (Preliminary result)   Collection Time: 04/25/16  1:21 PM  Result Value Ref Range Status   Specimen Description BLOOD LEFT ARM  Final   Special Requests BOTTLES DRAWN AEROBIC AND ANAEROBIC 10CC EACH  Final   Culture   Final    NO GROWTH < 24 HOURS Performed at [facility]    Report Status PENDING  Incomplete    Today   Subjective    Alexandria [name] today has no headache,no chest abdominal pain,no new weakness tingling or numbness, feels much better wants to go home today.    Objective   Blood pressure 126/68, pulse 65, temperature 97.7 F (36.5 C), temperature source Oral, resp. rate 18, height 5\' 6"  (1.676 m), weight 103.4 kg (228 lb), last menstrual period 01/30/2005, SpO2 99 %.   Intake/Output Summary (Last 24 hours) at 04/27/16 0742 Last data filed at 04/26/16 1841  Gross per 24 hour  Intake              734 ml  Output                0 ml  Net              734 ml    Exam Awake Alert, Oriented x 3, No new F.N deficits, Normal affect [place].AT,PERRAL Supple Neck,No JVD, No cervical lymphadenopathy appriciated.  Symmetrical Chest wall movement, Good air movement bilaterally, CTAB RRR,No Gallops,Rubs or new Murmurs, No Parasternal Heave +ve B.Sounds, Abd Soft, Non tender, No organomegaly appriciated, No rebound -guarding or rigidity. No Cyanosis, Clubbing or edema, No new Rash or bruise   Data Review   CBC w Diff: Lab Results  Component Value Date   WBC 6.9 04/26/2016  HGB 11.5 (L) 04/26/2016   HGB 8.7 (L) 11/22/2015   HCT 36.6 04/26/2016   HCT 29.5 (L) 11/22/2015   PLT 261 04/26/2016   PLT 235 11/22/2015   LYMPHOPCT 24.2 03/15/2016   LYMPHOPCT 23.9 11/22/2015   MONOPCT 7.8 03/15/2016   MONOPCT 8.6 11/22/2015   EOSPCT 2.1 03/15/2016   EOSPCT 3.9 11/22/2015   BASOPCT 0.3 03/15/2016   BASOPCT 0.2 11/22/2015    CMP: Lab Results  Component Value Date   NA 139 04/26/2016   NA 139 03/26/2015   K 3.3  (L) 04/27/2016   K 4.3 03/26/2015   CL 98 (L) 04/26/2016   CL 99 03/04/2013   CO2 29 04/26/2016   CO2 29 03/26/2015   BUN 16 04/26/2016   BUN 21.1 03/26/2015   CREATININE 0.88 04/26/2016   CREATININE 1.0 03/26/2015   PROT 7.7 04/25/2016   PROT 7.7 01/01/2014   ALBUMIN 3.8 04/25/2016   ALBUMIN 3.7 01/01/2014   BILITOT 0.6 04/25/2016   BILITOT 0.25 01/01/2014   ALKPHOS 143 (H) 04/25/2016   ALKPHOS 155 (H) 01/01/2014   AST 25 04/25/2016   AST 32 01/01/2014   ALT 24 04/25/2016   ALT 29 01/01/2014  .   Total Time in preparing paper work, data evaluation and todays exam - 35 minutes  Thurnell Lose M.D on 04/27/2016 at 7:42 AM  [facility]   Office  [phone]

## 2016-04-27 NOTE — Progress Notes (Signed)
Patient discharged.  IV and telemetry removed.  Patient notified of discharge, educated on discharge instructions, medications, and follow-up appointment.  Patient stated she would like to make her own follow-up appointment, showed patient number and address.  AVS signed, no questions or concerns.  Patient gathered belongings, prescription sent electronically and one Rx given hard copy to patient.  Patient escorted to ride with NT via wheelchair.

## 2016-04-30 LAB — CULTURE, BLOOD (ROUTINE X 2)
Culture: NO GROWTH
Culture: NO GROWTH

## 2016-05-17 DIAGNOSIS — M4696 Unspecified inflammatory spondylopathy, lumbar region: Secondary | ICD-10-CM | POA: Diagnosis not present

## 2016-05-17 DIAGNOSIS — M5137 Other intervertebral disc degeneration, lumbosacral region: Secondary | ICD-10-CM | POA: Diagnosis not present

## 2016-05-17 DIAGNOSIS — Z79891 Long term (current) use of opiate analgesic: Secondary | ICD-10-CM | POA: Diagnosis not present

## 2016-05-17 DIAGNOSIS — M25569 Pain in unspecified knee: Secondary | ICD-10-CM | POA: Diagnosis not present

## 2016-05-17 DIAGNOSIS — M170 Bilateral primary osteoarthritis of knee: Secondary | ICD-10-CM | POA: Diagnosis not present

## 2016-05-17 DIAGNOSIS — Z79899 Other long term (current) drug therapy: Secondary | ICD-10-CM | POA: Diagnosis not present

## 2016-05-17 DIAGNOSIS — G894 Chronic pain syndrome: Secondary | ICD-10-CM | POA: Diagnosis not present

## 2016-05-19 ENCOUNTER — Encounter: Payer: Self-pay | Admitting: Internal Medicine

## 2016-05-19 ENCOUNTER — Ambulatory Visit (INDEPENDENT_AMBULATORY_CARE_PROVIDER_SITE_OTHER): Payer: Medicare Other | Admitting: Internal Medicine

## 2016-05-19 VITALS — BP 130/80 | HR 125 | Temp 97.8°F | Resp 22 | Ht 66.0 in | Wt 247.0 lb

## 2016-05-19 DIAGNOSIS — I10 Essential (primary) hypertension: Secondary | ICD-10-CM | POA: Diagnosis not present

## 2016-05-19 DIAGNOSIS — I5033 Acute on chronic diastolic (congestive) heart failure: Secondary | ICD-10-CM | POA: Diagnosis not present

## 2016-05-19 DIAGNOSIS — C50911 Malignant neoplasm of unspecified site of right female breast: Secondary | ICD-10-CM

## 2016-05-19 DIAGNOSIS — M549 Dorsalgia, unspecified: Secondary | ICD-10-CM | POA: Diagnosis not present

## 2016-05-19 DIAGNOSIS — G8929 Other chronic pain: Secondary | ICD-10-CM

## 2016-05-19 DIAGNOSIS — R05 Cough: Secondary | ICD-10-CM

## 2016-05-19 DIAGNOSIS — R059 Cough, unspecified: Secondary | ICD-10-CM

## 2016-05-19 DIAGNOSIS — D509 Iron deficiency anemia, unspecified: Secondary | ICD-10-CM

## 2016-05-19 LAB — BASIC METABOLIC PANEL
BUN: 19 mg/dL (ref 6–23)
CO2: 26 meq/L (ref 19–32)
Calcium: 9.3 mg/dL (ref 8.4–10.5)
Chloride: 102 mEq/L (ref 96–112)
Creatinine, Ser: 0.85 mg/dL (ref 0.40–1.20)
GFR: 71.72 mL/min (ref >60.00)
GLUCOSE: 118 mg/dL — AB (ref 70–99)
POTASSIUM: 3.7 meq/L (ref 3.5–5.1)
SODIUM: 136 meq/L (ref 135–145)

## 2016-05-19 MED ORDER — LORAZEPAM 0.5 MG PO TABS: 0.5000 mg | ORAL_TABLET | Freq: Three times a day (TID) | ORAL | 1 refills | Status: DC | PRN

## 2016-05-19 MED ORDER — GUAIFENESIN-DM 100-10 MG/5ML PO SYRP: 10.0000 mL | ORAL_SOLUTION | ORAL | 0 refills | Status: DC | PRN

## 2016-05-19 MED ORDER — ESCITALOPRAM OXALATE 10 MG PO TABS: 10.0000 mg | ORAL_TABLET | Freq: Two times a day (BID) | ORAL | 0 refills | Status: DC

## 2016-05-19 NOTE — Progress Notes (Signed)
Subjective:    Patient ID: Alexandria [name], female    DOB: November 06, 1950, 65 y.o.   MRN: [id number]  HPI  65 year old patient who is seen today following a recent hospital discharge.  She was admitted for exacerbation of COPD. She has a history of chronic diastolic heart failure and history of fluid overload.  She is on furosemide and also takes Zaroxolyn 1-2 times per week. Complaints today include chronic cough She has history of chronic low back pain and is on chronic narcotics. She has a history of renal insufficiency.  Hospital records reviewed  Scheduled for cardiology follow-up next month   Past Medical History:  Diagnosis Date  . Anxiety   . Breast cancer ([place]) 10/02/12   Invasive Ductal Carcinoma of the Right Upper Outer Quadrant - ER (>90%), PR - Neg., Her2 Neu Negative, Ki-67 Unknown  . Chronic back pain    "all the way up and down"  . Chronic bronchitis ([place])    "get it q yr" (09/07/2014)  . Complication of anesthesia    "I come out really anxious; need Ativan to ease me out" (09/07/2014)  . Daily headache    "recently" (09/07/2014)  . DDD (degenerative disc disease), cervical   . DDD (degenerative disc disease), lumbosacral   . DDD (degenerative disc disease), thoracolumbar   . Diastolic dysfunction   . Diastolic heart failure ([place])   . Fibromyalgia   . GAD (generalized anxiety disorder)   . H/O hiatal hernia   . History of blood transfusion "3-4"   "never can tell why; don't know where the blood was coming from; doesn't show up in stool or urine"  . Hx of pulmonary embolus    During c-Section  . Hx of radiation therapy 03/04/13- 04/17/13   right breast 50 Gy 25 fractions, right breast boost 10 Gy 5 fractions  . Hyperlipidemia   . Hypertension   . Iron deficiency anemia   . Lymphedema    bilateral lower extremity  . Obesity    Class 2  . On home oxygen therapy    "2L just at night" (09/07/2014)  . Peripheral neuropathic pain ([place])   . Pneumonia    "at least  twice/yr" (09/07/2014)  . Recurrent major depression ([place]) 06/2014   Seen by Dr. Louretta Shorten  . S/P radiation therapy  03/04/2013-04/17/2013   1) Right breast / 50 Gy in 25 fractions/ 2) Right breast boost / 10 Gy in 5 fractions  . Status post chemotherapy    4 cycles of Taxotere and cytoxan     Social History   Social History  . Marital status: Married    Spouse name: N/A  . Number of children: N/A  . Years of education: N/A   Occupational History  . retired Therapist, sports    Social History Main Topics  . Smoking status: Passive Smoke Exposure - Never Smoker  . Smokeless tobacco: Never Used     Comment: Through father  . Alcohol use No  . Drug use: No  . Sexual activity: Not Currently   Other Topics Concern  . Not on file   Social History Narrative   Originally from [place], [place]. Previously lived in [place] to [facility] in 2014. Previously worked as an Warden/ranger. No known TB exposure. No pets currently. No bird exposure. Minimal mold exposure.     Past Surgical History:  Procedure Laterality Date  . ABDOMINAL HYSTERECTOMY    . APPENDECTOMY    . BREAST BIOPSY Right   .  BREAST LUMPECTOMY Right   . [place]; 1978; 66;    "stillborn in 66"  . DILATION AND CURETTAGE OF UTERUS  "numerous"  . FOOT SURGERY Right ~ 2013 X 3   "put pins in but pins kept breaking"  . INGUINAL HERNIA REPAIR Bilateral    "cancer"  . PARTIAL COLECTOMY N/A 07/04/2014   Procedure: REPAIR OF INCARCERATED INCISIONAL HERNIA WITH MESH;  Surgeon: Excell Seltzer, [place];  Location: [facility];  Service: General;  Laterality: N/A;  . TONSILLECTOMY    . UMBILICAL HERNIA REPAIR  X 2    Family History  Problem Relation Age of Onset  . Parkinson's disease Mother   . Emphysema Maternal Grandfather   . COPD Maternal Grandfather   . COPD Sister   . COPD Maternal Grandmother   . CAD Neg Hx     Allergies  Allergen Reactions  . Contrast Media [Iodinated Diagnostic Agents] Anaphylaxis  . Solu-Medrol  [Methylprednisolone Acetate] Anaphylaxis  . Albuterol Other (See Comments)    Anxious   . Prednisone Other (See Comments)    Pt gets very agitated when she takes high doses of steroids    Current Outpatient Prescriptions on File Prior to Visit  Medication Sig Dispense Refill  . albuterol (PROVENTIL HFA;VENTOLIN HFA) 108 (90 Base) MCG/ACT inhaler Inhale 2 puffs into the lungs every 6 (six) hours as needed for wheezing or shortness of breath. 1 Inhaler 2  . anastrozole (ARIMIDEX) 1 MG tablet Take 1 tablet (1 mg total) by mouth daily. 90 tablet 0  . atorvastatin (LIPITOR) 20 MG tablet Take 1 tablet (20 mg total) by mouth daily. 90 tablet 1  . carvedilol (COREG) 6.25 MG tablet Take 1 tablet (6.25 mg total) by mouth 2 (two) times daily with a meal. 180 tablet 3  . cyclobenzaprine (FLEXERIL) 10 MG tablet Take 10 mg by mouth at bedtime.   6  . docusate sodium 100 MG CAPS Take 100 mg by mouth 2 (two) times daily. (Patient taking differently: Take 100 mg by mouth daily as needed (mild constipation). ) 10 capsule 0  . escitalopram (LEXAPRO) 10 MG tablet Take 1 tablet (10 mg total) by mouth 2 (two) times daily. 180 tablet 0  . furosemide (LASIX) 80 MG tablet TAKE 1 TABLET BY MOUTH TWICE DAILY (Patient taking differently: TAKE 80 MG BY MOUTH TWICE DAILY AS NEEDED FOR FLUID GAIN) 180 tablet 0  . guaiFENesin-dextromethorphan (ROBITUSSIN DM) 100-10 MG/5ML syrup Take 10 mLs by mouth every 4 (four) hours as needed for cough. 118 mL 0  . Lidocaine 0.5 % GEL Apply 1 application topically 3 (three) times daily.    Marland Kitchen lisinopril (PRINIVIL,ZESTRIL) 5 MG tablet TAKE 1 TABLET BY MOUTH DAILY 30 tablet 5  . lubiprostone (AMITIZA) 24 MCG capsule Take 1 capsule (24 mcg total) by mouth 2 (two) times daily with a meal. 60 capsule 2  . metolazone (ZAROXOLYN) 2.5 MG tablet Take 1 tablet (2.5 mg total) by mouth daily as needed (fluid gain).    Marland Kitchen oxyCODONE-acetaminophen (PERCOCET) 10-325 MG tablet Take 1 tablet by mouth every 6  (six) hours as needed for pain. 15 tablet 0  . oxymorphone (OPANA ER) 30 MG 12 hr tablet Take 1 tablet by mouth 2 (two) times daily.   0  . potassium chloride SA (K-DUR,KLOR-CON) 20 MEQ tablet Take 1 tablet (20 mEq total) by mouth 3 (three) times daily. 270 tablet 1  . ranitidine (ZANTAC) 150 MG tablet Take 1 tablet (150 mg total) by  mouth at bedtime. 30 tablet 3   No current facility-administered medications on file prior to visit.     BP 130/80 (BP Location: Left Arm, Patient Position: Sitting, Cuff Size: Large)   Pulse (!) 125   Temp 97.8 F (36.6 C) (Oral)   Resp (!) 22   Ht _0  (1.676 m)   Wt 247 lb (112 kg)   LMP 01/30/2005   SpO2 97%   BMI 39.87 kg/m       Review of Systems  Constitutional: Negative.   HENT: Negative for congestion, dental problem, hearing loss, rhinorrhea, sinus pressure, sore throat and tinnitus.   Eyes: Negative for pain, discharge and visual disturbance.  Respiratory: Positive for cough. Negative for shortness of breath.   Cardiovascular: Positive for leg swelling. Negative for chest pain and palpitations.  Gastrointestinal: Negative for abdominal distention, abdominal pain, blood in stool, constipation, diarrhea, nausea and vomiting.  Genitourinary: Negative for difficulty urinating, dysuria, flank pain, frequency, hematuria, pelvic pain, urgency, vaginal bleeding, vaginal discharge and vaginal pain.  Musculoskeletal: Positive for back pain. Negative for arthralgias, gait problem and joint swelling.  Skin: Negative for rash.  Neurological: Negative for dizziness, syncope, speech difficulty, weakness, numbness and headaches.  Hematological: Negative for adenopathy.  Psychiatric/Behavioral: Negative for agitation, behavioral problems and dysphoric mood. The patient is not nervous/anxious.        Objective:   Physical Exam  Constitutional: She is oriented to person, place, and time. She appears well-developed and well-nourished.  Obese No  distress Blood pressure 124/80  O2 saturation 97  Pulse decreased to 100  No paroxysms of coughing during the encounter  HENT:  Head: Normocephalic.  Right Ear: External ear normal.  Left Ear: External ear normal.  Mouth/Throat: Oropharynx is clear and moist.  Eyes: Conjunctivae and EOM are normal. Pupils are equal, round, and reactive to light.  Neck: Normal range of motion. Neck supple. No thyromegaly present.  Cardiovascular: Normal rate, regular rhythm, normal heart sounds and intact distal pulses.   Resting tachycardia, rate 100  Pulmonary/Chest: Effort normal and breath sounds normal.  Abdominal: Soft. Bowel sounds are normal. She exhibits no mass. There is no tenderness.  Musculoskeletal: Normal range of motion. She exhibits edema.  Only trace pedal edema  Lymphadenopathy:    She has no cervical adenopathy.  Neurological: She is alert and oriented to person, place, and time.  Skin: Skin is warm and dry. No rash noted.  Psychiatric: She has a normal mood and affect. Her behavior is normal.          Assessment & Plan:  COPD exacerbation, improving Essential hypertension, well-controlled Diastolic heart failure.  No change in medical regimen.  Follow cardiology.  History of hypokalemia.  Will check electrolytes Patient counseled about overuse of Zaroxolyn History of renal insufficiency Chronic low back pain Chronic narcotic use  Recheck 3 months Follow-up cardiology Check electrolytes  Nyoka Cowden

## 2016-05-19 NOTE — Progress Notes (Signed)
Pre visit review using our clinic review tool, if applicable. No additional management support is needed unless otherwise documented below in the visit note. 

## 2016-05-19 NOTE — Patient Instructions (Signed)
Limit your sodium (Salt) intake  Cardiology follow-up as scheduled  You need to lose weight.  Consider a lower calorie diet and regular exercise.

## 2016-06-07 ENCOUNTER — Ambulatory Visit (INDEPENDENT_AMBULATORY_CARE_PROVIDER_SITE_OTHER): Payer: Medicare Other | Admitting: Podiatry

## 2016-06-07 ENCOUNTER — Encounter: Payer: Self-pay | Admitting: Podiatry

## 2016-06-07 VITALS — BP 137/83 | HR 95 | Resp 14 | Ht 66.0 in | Wt 240.0 lb

## 2016-06-07 DIAGNOSIS — M79676 Pain in unspecified toe(s): Secondary | ICD-10-CM | POA: Diagnosis not present

## 2016-06-07 DIAGNOSIS — M2042 Other hammer toe(s) (acquired), left foot: Secondary | ICD-10-CM

## 2016-06-07 DIAGNOSIS — M2040 Other hammer toe(s) (acquired), unspecified foot: Secondary | ICD-10-CM | POA: Diagnosis not present

## 2016-06-07 DIAGNOSIS — M2010 Hallux valgus (acquired), unspecified foot: Secondary | ICD-10-CM

## 2016-06-07 DIAGNOSIS — M2032 Hallux varus (acquired), left foot: Secondary | ICD-10-CM

## 2016-06-07 DIAGNOSIS — B351 Tinea unguium: Secondary | ICD-10-CM | POA: Diagnosis not present

## 2016-06-07 NOTE — Progress Notes (Signed)
Subjective:    Patient ID: Alexandria [name], female    DOB: 1950/12/08, 65 y.o.   MRN: [id number]  HPI this patient presents to the office with chief complaint of a painful, red area on the top of her left big toe. She says that it is been present for approximately 4-5 days and is painful. She denies any drainage noted from the site. Patient says that she has just been released from the hospital for community acquired pneumonia. She also has a history of breast cancer. She gives a history of having multiple foot surgeries on both feet. She states that the doctor who performed the surgeries has performed multiple procedures for the same problem. She says the hospital administrator told her not to allow him to perform any further surgery on either foot. This occurred in 2014 in [place]. She also is concerned about the thick nature of her toenails on both feet. She presents the office today for an evaluation of both of her feet, but requests treatment for the inflamed skin lesion in her long nails  Review of Systems  Constitutional: Positive for unexpected weight change.  HENT: Positive for sinus pressure.   Respiratory: Positive for shortness of breath.   Gastrointestinal: Positive for abdominal pain and constipation.       Bloating  Musculoskeletal: Positive for back pain and gait problem.       Joint pain  Skin: Positive for color change and rash.  Neurological: Positive for weakness and numbness.  Psychiatric/Behavioral:       Anxiety       Objective:   Physical Exam GENERAL APPEARANCE: Alert, conversant. Appropriately groomed. No acute distress.  VASCULAR: Pedal pulses are  palpable at  DP bilateral. PT pulses are not palpable bilateral due to swelling. Capillary refill time is immediate to all digits,  Normal temperature gradient.  Digital hair growth is present bilateral  NEUROLOGIC: sensation is normal to 5.07 monofilament at 5/5 sites bilateral.  Light touch is intact bilateral, Muscle  strength normal.  MUSCULOSKELETAL: acceptable muscle strength, tone and stability bilateral.  Intrinsic muscluature intact bilateral.  HAV  B/L.  D slocation of base proximal phalanx 1st MPJ  Left hallux.Multiple toe deformities especially second toe right foot which is fused and now deviated  DERMATOLOGIC: skin color, texture, and turgor are within normal limits.  No preulcerative lesions or ulcers  are seen, no interdigital maceration noted.  No open lesions present.  No drainage noted. Localized  redness swollen inflamed lesion on the dorsum of great toe left foot. No drainage or streaking noted.  NAILS  Thick disfigured discolored nails both feet.          Assessment & Plan:  Skin lesion secondary to dislocated big toe left foot. Onychomycosis  B/L   IE  Examination of skin lesion reveals no infection but a localized inflamed skin lesion. I believe this is formed. Due to the dorsal dislocation of the left hallux. I feel she most has aggravated this with footgear. The skin lesion was treated with Neosporin and a dry sterile dressing. She was instructed to soak her feet and Epson salt soaks .   I told her she needs to wear a surgical shoe for 1 week to allow this inflamed area to heal. She is to come to the office if the problem worsens. Debridement of onychomycosis 10. Return to the office in 2 weeks for evaluation of the left hallux and 12 weeks for nail debridement. Patient does state that  she is not presently interested in any surgical correction of her feet. This patient does not know what procedures were performed and would benefit from having x-rays taken of both feet to determine the presence state of her feet    Gardiner Barefoot DPM   Gardiner Barefoot DPM

## 2016-06-14 DIAGNOSIS — M47817 Spondylosis without myelopathy or radiculopathy, lumbosacral region: Secondary | ICD-10-CM | POA: Diagnosis not present

## 2016-06-14 DIAGNOSIS — M4696 Unspecified inflammatory spondylopathy, lumbar region: Secondary | ICD-10-CM | POA: Diagnosis not present

## 2016-06-14 DIAGNOSIS — M5137 Other intervertebral disc degeneration, lumbosacral region: Secondary | ICD-10-CM | POA: Diagnosis not present

## 2016-06-14 DIAGNOSIS — M179 Osteoarthritis of knee, unspecified: Secondary | ICD-10-CM | POA: Diagnosis not present

## 2016-06-14 DIAGNOSIS — G894 Chronic pain syndrome: Secondary | ICD-10-CM | POA: Diagnosis not present

## 2016-06-14 DIAGNOSIS — Z79891 Long term (current) use of opiate analgesic: Secondary | ICD-10-CM | POA: Diagnosis not present

## 2016-06-14 DIAGNOSIS — Z79899 Other long term (current) drug therapy: Secondary | ICD-10-CM | POA: Diagnosis not present

## 2016-06-15 DIAGNOSIS — M4317 Spondylolisthesis, lumbosacral region: Secondary | ICD-10-CM | POA: Diagnosis not present

## 2016-06-27 ENCOUNTER — Ambulatory Visit: Payer: Medicare Other | Admitting: Cardiovascular Disease

## 2016-07-03 ENCOUNTER — Encounter: Payer: Self-pay | Admitting: Physician Assistant

## 2016-07-03 ENCOUNTER — Ambulatory Visit (INDEPENDENT_AMBULATORY_CARE_PROVIDER_SITE_OTHER): Payer: Medicare Other | Admitting: Physician Assistant

## 2016-07-03 VITALS — BP 160/90 | HR 100 | Ht 66.0 in | Wt 240.0 lb

## 2016-07-03 DIAGNOSIS — I5032 Chronic diastolic (congestive) heart failure: Secondary | ICD-10-CM

## 2016-07-03 DIAGNOSIS — Z01818 Encounter for other preprocedural examination: Secondary | ICD-10-CM | POA: Diagnosis not present

## 2016-07-03 NOTE — Progress Notes (Signed)
Cardiology Office Note   Date:  07/03/2016   ID:  SAFIRE GORDIN, DOB 1951/03/20, MRN [id number]  PCP:  Nyoka Cowden, [place]  Cardiologist:  Dr Gwenlyn Found 10/2014 Alexandria Ferries, PA-C  06/2015  History of Present Illness: Alexandria [name] is a 65 y.o. female with a history of  LE edema, breast CA w/ chemo/XRT/lumpectomy, nl EF w/ DD on echo, GERD, D-CHF, COPD not on O2, chronic back pain w/ narcotic use, anxiety on benzo's, HTN, HLD, neuropathy.  D/c 05/07/2016 after admit for SOB, COPD exacerbation/bronchitis  Alexandria [name] presents for cardiology evaluation.  In August, after her hospitalization, she initially felt better. However, she began coughing again. She is coughing up yellowish sputum. She is coughing frequently. It is making her feel bad, and making her tired. She is not aware of any fevers. She wheezes a little.  She has had some swelling in her legs recently, but started taking her Lasix 80 mg twice a day more consistently, and the edema improved. She missed one dose yesterday because she was going to be gone most of the day. She took a dose last night and this morning and says her edema is improving. She has an area on her left extremity that is being followed by the dermatologist. She also has a left foot problem that is being followed by the podiatrist. She has knee problems as well which limit her activity chronically. She is hoping to get better from the coughing soon so that she can go back to water aerobics.  She has not been weighing herself every day consistently. She is trying to do better with eating but does not always watch her salt. She has not taken the metolazone for edema recently. She is compliant with her carvedilol twice a day, lisinopril 5 mg a day and Lipitor.  She has significant upper abdominal discomfort from her hiatal hernia. She was going to have surgery for this in November, but it is pushed back because of her upper respiratory illness. She mentions  that she will need cardiac clearance before her surgery.  Right now, because of her cough and upper respiratory problems, activity level is extremely poor.  She is thinking about getting a gastric sleeve at the same time as her hiatal hernia surgery, but is not sure she was to do this.  She says her blood pressure is not normally as high as it is today in the office, her systolic blood pressure generally runs in the 120s-130s at home. When she takes her Lasix consistently, she only has mild daytime edema, a little worse on the left.    Past Medical History:  Diagnosis Date  . Anxiety   . Breast cancer ([place]) 10/02/12   Invasive Ductal Carcinoma of the Right Upper Outer Quadrant - ER (>90%), PR - Neg., Her2 Neu Negative, Ki-67 Unknown  . Chronic back pain    "all the way up and down"  . Chronic bronchitis ([place])    "get it q yr" (09/07/2014)  . Complication of anesthesia    "I come out really anxious; need Ativan to ease me out" (09/07/2014)  . Daily headache    "recently" (09/07/2014)  . DDD (degenerative disc disease), cervical   . DDD (degenerative disc disease), lumbosacral   . DDD (degenerative disc disease), thoracolumbar   . Diastolic dysfunction   . Diastolic heart failure ([place])   . Fibromyalgia   . GAD (generalized anxiety disorder)   . H/O hiatal hernia   .  History of blood transfusion "3-4"   "never can tell why; don't know where the blood was coming from; doesn't show up in stool or urine"  . Hx of pulmonary embolus    During c-Section  . Hx of radiation therapy 03/04/13- 04/17/13   right breast 50 Gy 25 fractions, right breast boost 10 Gy 5 fractions  . Hyperlipidemia   . Hypertension   . Iron deficiency anemia   . Lymphedema    bilateral lower extremity  . Obesity    Class 2  . On home oxygen therapy    "2L just at night" (09/07/2014)  . Peripheral neuropathic pain   . Pneumonia    "at least twice/yr" (09/07/2014)  . Recurrent major depression ([facility]) 06/2014    Seen by Dr. Elsie Saas  . S/P radiation therapy  03/04/2013-04/17/2013   1) Right breast / 50 Gy in 25 fractions/ 2) Right breast boost / 10 Gy in 5 fractions  . Status post chemotherapy    4 cycles of Taxotere and cytoxan    Past Surgical History:  Procedure Laterality Date  . ABDOMINAL HYSTERECTOMY    . APPENDECTOMY    . BREAST BIOPSY Right   . BREAST LUMPECTOMY Right   . CESAREAN SECTION     [phone]  . DILATION AND CURETTAGE OF UTERUS  "numerous"  . FOOT SURGERY Right ~ 2013 X 3   "put pins in but pins kept breaking"  . INGUINAL HERNIA REPAIR Bilateral    "cancer"  . PARTIAL COLECTOMY N/A 07/04/2014   Procedure: REPAIR OF INCARCERATED INCISIONAL HERNIA WITH MESH;  Surgeon: Glenna Fellows, MD;  Location: [facility];  Service: General;  Laterality: N/A;  . TONSILLECTOMY    . UMBILICAL HERNIA REPAIR  X 2    Current Outpatient Prescriptions  Medication Sig Dispense Refill  . albuterol (PROVENTIL HFA;VENTOLIN HFA) 108 (90 Base) MCG/ACT inhaler Inhale 2 puffs into the lungs every 6 (six) hours as needed for wheezing or shortness of breath. 1 Inhaler 2  . anastrozole (ARIMIDEX) 1 MG tablet Take 1 tablet (1 mg total) by mouth daily. 90 tablet 0  . atorvastatin (LIPITOR) 20 MG tablet Take 1 tablet (20 mg total) by mouth daily. 90 tablet 1  . carvedilol (COREG) 6.25 MG tablet Take 1 tablet (6.25 mg total) by mouth 2 (two) times daily with a meal. 180 tablet 3  . cyclobenzaprine (FLEXERIL) 10 MG tablet Take 10 mg by mouth at bedtime.   6  . docusate sodium 100 MG CAPS Take 100 mg by mouth 2 (two) times daily. (Patient taking differently: Take 100 mg by mouth daily as needed (mild constipation). ) 10 capsule 0  . escitalopram (LEXAPRO) 10 MG tablet Take 1 tablet (10 mg total) by mouth 2 (two) times daily. 180 tablet 0  . furosemide (LASIX) 80 MG tablet TAKE 1 TABLET BY MOUTH TWICE DAILY (Patient taking differently: TAKE 80 MG BY MOUTH TWICE DAILY AS NEEDED FOR FLUID GAIN) 180 tablet 0   . guaiFENesin-dextromethorphan (ROBITUSSIN DM) 100-10 MG/5ML syrup Take 10 mLs by mouth every 4 (four) hours as needed for cough. 118 mL 0  . Lidocaine 0.5 % GEL Apply 1 application topically 3 (three) times daily.    Marland Kitchen lisinopril (PRINIVIL,ZESTRIL) 5 MG tablet TAKE 1 TABLET BY MOUTH DAILY 30 tablet 5  . LORazepam (ATIVAN) 0.5 MG tablet Take 1 tablet (0.5 mg total) by mouth every 8 (eight) hours as needed. for anxiety 270 tablet 1  . lubiprostone (AMITIZA) 24 MCG capsule  Take 1 capsule (24 mcg total) by mouth 2 (two) times daily with a meal. 60 capsule 2  . metolazone (ZAROXOLYN) 2.5 MG tablet Take 1 tablet (2.5 mg total) by mouth daily as needed (fluid gain).    Marland Kitchen oxyCODONE-acetaminophen (PERCOCET) 10-325 MG tablet Take 1 tablet by mouth every 6 (six) hours as needed for pain. 15 tablet 0  . oxymorphone (OPANA ER) 30 MG 12 hr tablet Take 1 tablet by mouth 2 (two) times daily.   0  . potassium chloride SA (K-DUR,KLOR-CON) 20 MEQ tablet Take 1 tablet (20 mEq total) by mouth 3 (three) times daily. 270 tablet 1  . ranitidine (ZANTAC) 150 MG tablet Take 1 tablet (150 mg total) by mouth at bedtime. 30 tablet 3   No current facility-administered medications for this visit.     Allergies:   Contrast media [iodinated diagnostic agents]; Solu-medrol [methylprednisolone acetate]; Albuterol; and Prednisone    Social History:  The patient  reports that she has never smoked. She has never used smokeless tobacco. She reports that she does not drink alcohol or use drugs.   Family History:  The patient's family history includes COPD in her maternal grandfather, maternal grandmother, and sister; Emphysema in her maternal grandfather; Heart attack in her sister; Parkinson's disease in her mother.    ROS:  Please see the history of present illness. All other systems are reviewed and negative.    PHYSICAL EXAM: VS:  BP (!) 160/90   Pulse 100   Ht '5\' 6"'$  (1.676 m)   Wt 240 lb (108.9 kg)   LMP 01/30/2005    SpO2 96%   BMI 38.74 kg/m  , BMI Body mass index is 38.74 kg/m. GEN: Well nourished, well developed, female in no acute distress  HEENT: normal for age  Neck: minimal JVD, no carotid bruit, no masses Cardiac: RRR;soft  murmur, no rubs, or gallops Respiratory: . Rales and some rhonchi bilaterally, increased work of breathing, minimal wheeze  GI: soft, nontender, nondistended, + BS MS: no deformity or atrophy;trace right, 1+ left  edema; distal pulses are 2+ in all 4 extremities   Skin: warm and dry, no rash Neuro:  Strength and sensation are intact Psych: euthymic mood, full affect   EKG:  EKG is not ordered today.  ECHO: 08/2014 - Left ventricle: The cavity size was normal. Wall thickness was normal. Systolic function was normal. The estimated ejection fraction was in the range of 55% to 60%. Wall motion was normal; there were no regional wall motion abnormalities. Doppler parameters are consistent with abnormal left ventricular relaxation (grade 1 diastolic dysfunction). Doppler parameters are consistent with high ventricular filling pressure. - Mitral valve: Calcified annulus. Impressions: - Normal LV function; grade 1 diastolic dysfunction; elevated left ventricular filling pressure.  Recent Labs: 04/25/2016: ALT 24; B Natriuretic Peptide 88.8 04/26/2016: Hemoglobin 11.5; Platelets 261; TSH 1.000 04/27/2016: Magnesium 2.1 05/19/2016: BUN 19; Creatinine, Ser 0.85; Potassium 3.7; Sodium 136    Lipid Panel    Component Value Date/Time   CHOL 185 05/22/2014 1625   TRIG 200 (H) 05/22/2014 1625   HDL 41 05/22/2014 1625   CHOLHDL 4.5 05/22/2014 1625   VLDL 40 05/22/2014 1625   LDLCALC 104 (H) 05/22/2014 1625     Wt Readings from Last 3 Encounters:  07/03/16 240 lb (108.9 kg)  06/07/16 240 lb (108.9 kg)  05/19/16 247 lb (112 kg)     Other studies Reviewed: Additional studies/ records that were reviewed today include: office notes and other  records  ASSESSMENT AND PLAN:  1.  Cardiovascular assessment: She is not having any acute cardiac symptoms, but her general health is poor. I do not find a record of any stress test in our system.  She is supposed to have hiatal hernia surgery in December, to be scheduled at her next appointment with the surgeon.  Dr. Gwenlyn Found to review the data and advise if a Myoview is needed. This could be performed after she recovers from her acute illness and before she follows up with him in late November  2. Diastolic CHF: Her weight is stable. She has an upper respiratory infection, but her volume status seems at baseline. She has missed a dose of Lasix in the last day or so. She is encouraged to be compliant with the Lasix twice a day and continue the metolazone daily as needed. Compliance with a low sodium diet is also encouraged.  3. Upper respiratory infection: She has been sick for several weeks now. It is not clear to me that she ever completely recovered from her upper respiratory infection in August. She is encouraged to follow-up with pulmonary and/or primary care. I will not change any medications.   Current medicines are reviewed at length with the patient today.  The patient does not have concerns regarding medicines.  The following changes have been made:  no change  Labs/ tests ordered today include:  No orders of the defined types were placed in this encounter.    Disposition:   FU with Dr. Gwenlyn Found  Signed, Alexandria Ferries, PA-C  07/03/2016 5:53 PM    [place] Phone: [phone]; Fax: [phone]  This note was written with the assistance of speech recognition software. Please excuse any transcriptional errors.

## 2016-07-03 NOTE — Patient Instructions (Addendum)
Medication Instructions:   NO CHANGE  Follow-Up:  Your physician recommends that you schedule a follow-up appointment in: 2 MONTHS WITH DR BERRY   FOLLOW UP THIS WEEK WITH PULMONARY REGARDING URI

## 2016-07-04 ENCOUNTER — Telehealth: Payer: Self-pay | Admitting: Internal Medicine

## 2016-07-04 DIAGNOSIS — L570 Actinic keratosis: Secondary | ICD-10-CM | POA: Diagnosis not present

## 2016-07-04 DIAGNOSIS — I872 Venous insufficiency (chronic) (peripheral): Secondary | ICD-10-CM | POA: Diagnosis not present

## 2016-07-04 DIAGNOSIS — C44702 Unspecified malignant neoplasm of skin of right lower limb, including hip: Secondary | ICD-10-CM | POA: Diagnosis not present

## 2016-07-04 DIAGNOSIS — D485 Neoplasm of uncertain behavior of skin: Secondary | ICD-10-CM | POA: Diagnosis not present

## 2016-07-04 DIAGNOSIS — I8312 Varicose veins of left lower extremity with inflammation: Secondary | ICD-10-CM | POA: Diagnosis not present

## 2016-07-04 NOTE — Telephone Encounter (Signed)
Pt states her hiatal hernia is getting worse. The pain is severe.  Pt refused appt with another provider this afternoon.  Pt states if the pain gets any worse she will go to the ED. Pt has appt with a surgeon but not until November 11. Advised pt I would let you know.

## 2016-07-04 NOTE — Telephone Encounter (Signed)
Please see message. °

## 2016-07-05 ENCOUNTER — Other Ambulatory Visit: Payer: Self-pay | Admitting: Pulmonary Disease

## 2016-07-05 ENCOUNTER — Ambulatory Visit (INDEPENDENT_AMBULATORY_CARE_PROVIDER_SITE_OTHER): Payer: Medicare Other | Admitting: Pulmonary Disease

## 2016-07-05 ENCOUNTER — Ambulatory Visit (INDEPENDENT_AMBULATORY_CARE_PROVIDER_SITE_OTHER)
Admission: RE | Admit: 2016-07-05 | Discharge: 2016-07-05 | Disposition: A | Discharge status: Home / Self Care | Payer: Medicare Other | Source: Ambulatory Visit | Attending: Pulmonary Disease | Admitting: Pulmonary Disease

## 2016-07-05 ENCOUNTER — Encounter: Payer: Self-pay | Admitting: Pulmonary Disease

## 2016-07-05 ENCOUNTER — Other Ambulatory Visit: Payer: Medicare Other

## 2016-07-05 VITALS — BP 142/90 | HR 125 | Ht 66.0 in | Wt 239.0 lb

## 2016-07-05 DIAGNOSIS — R05 Cough: Secondary | ICD-10-CM

## 2016-07-05 DIAGNOSIS — R059 Cough, unspecified: Secondary | ICD-10-CM

## 2016-07-05 DIAGNOSIS — K449 Diaphragmatic hernia without obstruction or gangrene: Secondary | ICD-10-CM

## 2016-07-05 DIAGNOSIS — R0602 Shortness of breath: Secondary | ICD-10-CM | POA: Diagnosis not present

## 2016-07-05 MED ORDER — MONTELUKAST SODIUM 5 MG PO CHEW: 5.0000 mg | CHEWABLE_TABLET | Freq: Every day | ORAL | 3 refills | Status: AC

## 2016-07-05 MED ORDER — BUDESONIDE-FORMOTEROL FUMARATE 80-4.5 MCG/ACT IN AERO: 2.0000 | INHALATION_SPRAY | Freq: Two times a day (BID) | RESPIRATORY_TRACT | 0 refills | Status: DC

## 2016-07-05 MED ORDER — BUDESONIDE-FORMOTEROL FUMARATE 80-4.5 MCG/ACT IN AERO: 2.0000 | INHALATION_SPRAY | Freq: Two times a day (BID) | RESPIRATORY_TRACT | 3 refills | Status: DC

## 2016-07-05 NOTE — Patient Instructions (Addendum)
   Use the Symbicort inhaler we are giving you  Continue using your Albuterol inhaler as needed for your cough  Call me if you feel your cough or breathing is getting any worse.  I will see you back in 6 weeks with some breathing tests to see how your lungs are working.  Call me if you have any questions.  TESTS ORDERED: 1. Serum RAST Panel 2. CXR PA/LAT 3. Sputum Culture for AFB, Fungus, & Bacteria 4. Full PFTs at follow-up appointment

## 2016-07-05 NOTE — Progress Notes (Signed)
Subjective:    Patient ID: Alexandria [name], female    DOB: [birth year], 65 y.o.   MRN: [id number]  C.C.:  Follow-up for Cough, Dyspnea on Exertion, Hiatal Hernia., & H/O PE  HPI  Admitted and discharged from hospital on 04/27/16 with shortness of breath and chest pain. Workup was consistent with early acute bronchitis versus early pneumonia with some mild wheezing and acute hypoxic respiratory failure.  Cough:  Seen last in Fall 2016 with a productive cough for 1 month. She reports her cough did improve somewhat after her discharge from hospital for a couple of days. She is continuing to produce a green mucus. She reports she has been using cough syrup to help relieve her cough. She is waking up at night coughing. She is using her albuterol inhaler intermittently and reports it does seem to help.   Dyspnea on Exertion: Multifactorial given the patient's morbid obesity & diastolic congestive heart failure. No previous oxygen requirement on her six-minute walk test. Reports her dyspnea on exertion has resolved.   Hiatal Hernia: Seen on chest CT imaging. Previously started on Zantac by me in 2016. She reports compliance with her Zantac but is still having some dyspepsia. She feels her dyspepsia is worse since her cough worsened.   H/O PE:  Around 5 or 1988. Occurred during pregnancy. On systemic anticoagulation for 1 year.  Review of Systems She reports she has had a fever up to 101F. She is chronically on tylenol containing pain medications. She has intermittent chills and sweats. She reports she does have pain in her chest with her cough. No other chest pain or pressure. No sore throat or sinus congestion, pressure, or drainage.  Allergies  Allergen Reactions  . Contrast Media [Iodinated Diagnostic Agents] Anaphylaxis  . Solu-Medrol [Methylprednisolone Acetate] Anaphylaxis  . Albuterol Other (See Comments)    Anxious   . Prednisone Other (See Comments)    Pt gets very agitated when she takes  high doses of steroids   Current Outpatient Prescriptions on File Prior to Visit  Medication Sig Dispense Refill  . albuterol (PROVENTIL HFA;VENTOLIN HFA) 108 (90 Base) MCG/ACT inhaler Inhale 2 puffs into the lungs every 6 (six) hours as needed for wheezing or shortness of breath. 1 Inhaler 2  . anastrozole (ARIMIDEX) 1 MG tablet Take 1 tablet (1 mg total) by mouth daily. 90 tablet 0  . atorvastatin (LIPITOR) 20 MG tablet Take 1 tablet (20 mg total) by mouth daily. 90 tablet 1  . carvedilol (COREG) 6.25 MG tablet Take 1 tablet (6.25 mg total) by mouth 2 (two) times daily with a meal. 180 tablet 3  . cyclobenzaprine (FLEXERIL) 10 MG tablet Take 10 mg by mouth at bedtime.   6  . docusate sodium 100 MG CAPS Take 100 mg by mouth 2 (two) times daily. (Patient taking differently: Take 100 mg by mouth daily as needed (mild constipation). ) 10 capsule 0  . escitalopram (LEXAPRO) 10 MG tablet Take 1 tablet (10 mg total) by mouth 2 (two) times daily. 180 tablet 0  . furosemide (LASIX) 80 MG tablet TAKE 1 TABLET BY MOUTH TWICE DAILY (Patient taking differently: TAKE 80 MG BY MOUTH TWICE DAILY AS NEEDED FOR FLUID GAIN) 180 tablet 0  . guaiFENesin-dextromethorphan (ROBITUSSIN DM) 100-10 MG/5ML syrup Take 10 mLs by mouth every 4 (four) hours as needed for cough. 118 mL 0  . Lidocaine 0.5 % GEL Apply 1 application topically 3 (three) times daily.    Marland Kitchen lisinopril (PRINIVIL,ZESTRIL) 5  MG tablet TAKE 1 TABLET BY MOUTH DAILY 30 tablet 5  . LORazepam (ATIVAN) 0.5 MG tablet Take 1 tablet (0.5 mg total) by mouth every 8 (eight) hours as needed. for anxiety 270 tablet 1  . lubiprostone (AMITIZA) 24 MCG capsule Take 1 capsule (24 mcg total) by mouth 2 (two) times daily with a meal. 60 capsule 2  . metolazone (ZAROXOLYN) 2.5 MG tablet Take 1 tablet (2.5 mg total) by mouth daily as needed (fluid gain).    Marland Kitchen oxyCODONE-acetaminophen (PERCOCET) 10-325 MG tablet Take 1 tablet by mouth every 6 (six) hours as needed for pain. 15  tablet 0  . oxymorphone (OPANA ER) 30 MG 12 hr tablet Take 1 tablet by mouth 2 (two) times daily.   0  . potassium chloride SA (K-DUR,KLOR-CON) 20 MEQ tablet Take 1 tablet (20 mEq total) by mouth 3 (three) times daily. 270 tablet 1  . ranitidine (ZANTAC) 150 MG tablet Take 1 tablet (150 mg total) by mouth at bedtime. 30 tablet 3   No current facility-administered medications on file prior to visit.    Past Medical History:  Diagnosis Date  . Anxiety   . Breast cancer ([place]) 10/02/12   Invasive Ductal Carcinoma of the Right Upper Outer Quadrant - ER (>90%), PR - Neg., Her2 Neu Negative, Ki-67 Unknown  . Chronic back pain    "all the way up and down"  . Chronic bronchitis ([place])    "get it q yr" (09/07/2014)  . Complication of anesthesia    "I come out really anxious; need Ativan to ease me out" (09/07/2014)  . Daily headache    "recently" (09/07/2014)  . DDD (degenerative disc disease), cervical   . DDD (degenerative disc disease), lumbosacral   . DDD (degenerative disc disease), thoracolumbar   . Diastolic dysfunction   . Diastolic heart failure ([place])   . Fibromyalgia   . GAD (generalized anxiety disorder)   . H/O hiatal hernia   . History of blood transfusion "3-4"   "never can tell why; don't know where the blood was coming from; doesn't show up in stool or urine"  . Hx of pulmonary embolus    During c-Section  . Hx of radiation therapy 03/04/13- 04/17/13   right breast 50 Gy 25 fractions, right breast boost 10 Gy 5 fractions  . Hyperlipidemia   . Hypertension   . Iron deficiency anemia   . Lymphedema    bilateral lower extremity  . Obesity    Class 2  . On home oxygen therapy    "2L just at night" (09/07/2014)  . Peripheral neuropathic pain   . Pneumonia    "at least twice/yr" (09/07/2014)  . Recurrent major depression ([place]) 06/2014   Seen by Dr. Louretta Shorten  . S/P radiation therapy  03/04/2013-04/17/2013   1) Right breast / 50 Gy in 25 fractions/ 2) Right breast boost /  10 Gy in 5 fractions  . Status post chemotherapy    4 cycles of Taxotere and cytoxan   Past Surgical History:  Procedure Laterality Date  . ABDOMINAL HYSTERECTOMY    . APPENDECTOMY    . BREAST BIOPSY Right   . BREAST LUMPECTOMY Right   . CESAREAN SECTION     [phone]  . DILATION AND CURETTAGE OF UTERUS  "numerous"  . FOOT SURGERY Right ~ 2013 X 3   "put pins in but pins kept breaking"  . INGUINAL HERNIA REPAIR Bilateral    "cancer"  . PARTIAL COLECTOMY N/A 07/04/2014  Procedure: REPAIR OF INCARCERATED INCISIONAL HERNIA WITH MESH;  Surgeon: Excell Seltzer, MD;  Location: [facility];  Service: General;  Laterality: N/A;  . TONSILLECTOMY    . UMBILICAL HERNIA REPAIR  X 2   Family History  Problem Relation Age of Onset  . Parkinson's disease Mother   . Emphysema Maternal Grandfather   . COPD Maternal Grandfather   . COPD Sister   . Heart attack Sister   . COPD Maternal Grandmother   . CAD Neg Hx    Social History   Social History  . Marital status: Married    Spouse name: N/A  . Number of children: N/A  . Years of education: N/A   Occupational History  . retired Therapist, sports    Social History Main Topics  . Smoking status: Never Smoker  . Smokeless tobacco: Never Used     Comment: Through father  . Alcohol use No  . Drug use: No  . Sexual activity: Not Currently   Other Topics Concern  . None   Social History Narrative   Originally from [place], [place]. Previously lived in [place] to [facility] in 2014. Previously worked as an Warden/ranger. No known TB exposure. No pets currently. No bird exposure. Minimal mold exposure.       Objective:   Physical Exam BP (!) 142/90 (BP Location: Left Arm, Cuff Size: Normal)   Pulse (!) 125   Ht 5' 6"  (1.676 m)   Wt 239 lb (108.4 kg) Comment: per pt.  LMP 01/30/2005   SpO2 95%   BMI 38.58 kg/m  General:  Awake. Alert. Obese. Integument:  Warm & dry. No rash on exposed skin. HEENT:  Mild nasal turbinate swelling bilaterally with  pale mucosa. Moist mucous membranes. No scleral icterus. Cardiovascular:  Pitting lower extremity edema persists. Regular rate. Unable to appreciate JVD given body habitus today. Pulmonary:  Clear on exhalation bilaterally. Normal work of breathing. Symmetric chest wall expansion. Abdomen: Protuberant. Normal bowel sounds. Soft.  PFT 07/07/15: FVC 2.55 L (76%) FEV1 2.06 L (70%) FEV1/FVC 0.81 FEF 25-75 2.08 L (90%) negative bronchodilator response TLC 4.33 L (83%) RV 86% ERV 30% DLCO uncorrected 75%  6MWT 07/07/15:  Walked 264 meters / Baseline Sat 94% on RA / Nadir Sat 92% on RA  IMAGING CT CHEST W/O 12/05/14 (previously reviewed by me): No pleural effusion or thickening. No pericardial effusion. Patchy dependent opacities favoring atelectasis. No other nodule or mass appreciated. No pathologic mediastinal adenopathy. Large hernia present.  CARDIAC TTE (09/09/14): LV normal in size. Grade 1 diastolic dysfunction. EF 55-60%. No regional wall motion abnormalities. LA & RA normal in size. RV normal in size and [place]. He another day in [place] function. Trivial tricuspid regurgitation. No aortic stenosis or regurgitation. No mitral stenosis or regurgitation. No pulmonic regurgitation. No pericardial effusion.  LABS 03/26/15 CBC: 4.2/12.6/30.4/183 ESR: 6 BMP: 139/4.3/103/29/21/1.0/115/9.6    Assessment & Plan:  65 y.o. female with ongoing productive cough. Multiple potential etiologies to the patient's cough including atypical infection, chronic reflux from her hiatal hernia, etc. Patient does have ongoing symptoms suggestive of infection with low-grade fever therefore obtaining culture I feel is reasonable. Her previous pulmonary function testing reviewed today shows no evidence of airway obstruction to suggest COPD but she could still have a reactive airways disease/underlying asthma. Given the seasonal variation and environmental precipitant is certainly possible and plausible. I instructed the  patient contact my office if she had any new breathing problems or questions before her next appointment.  Recommended against vigorous activity given her chest discomfort with coughing and potential worsening of her underlying hiatal hernia.   1. Cough: Checking chest x-ray PA/LAT today and sputum culture for AFB, fungus, and bacteria. Checking full pulmonary function testing at follow-up appointment. Starting patient on Symbicort 80/4.5 and Singulair 5 mg by mouth daily at bedtime. Patient to continue using albuterol inhaler as needed for symptom relief. 2. Hiatal Hernia: Continuing Zantac. 3. Health Maintenance:  S/P Pneumovax 10 October 2009 & Influenza Vaccine September 2017. 4. Follow-up:  Return to clinic in 6 weeks or sooner if needed.  Sonia Baller Ashok Cordia, M.D. [facility] Pager:  [phone] After 3pm or if no response, call [phone] 11:53 AM 07/05/16

## 2016-07-06 ENCOUNTER — Telehealth: Payer: Self-pay | Admitting: Pulmonary Disease

## 2016-07-06 ENCOUNTER — Other Ambulatory Visit: Payer: Self-pay | Admitting: Internal Medicine

## 2016-07-06 ENCOUNTER — Telehealth: Payer: Self-pay | Admitting: Internal Medicine

## 2016-07-06 DIAGNOSIS — D509 Iron deficiency anemia, unspecified: Secondary | ICD-10-CM

## 2016-07-06 DIAGNOSIS — C50911 Malignant neoplasm of unspecified site of right female breast: Secondary | ICD-10-CM

## 2016-07-06 LAB — RESPIRATORY ALLERGY PROFILE REGION II ~~LOC~~
Allergen, C. Herbarum, M2: <0.1 kU/L
Allergen, Cottonwood, t14: <0.1 kU/L
Allergen, D pternoyssinus,d7: <0.1 kU/L
Allergen, Mulberry, t76: <0.1 kU/L
Allergen, Oak,t7: <0.1 kU/L
Bermuda Grass: <0.1 kU/L
Box Elder IgE: <0.1 kU/L
Cat Dander: <0.1 kU/L
Cockroach: <0.1 kU/L
D. farinae: <0.1 kU/L
Dog Dander: <0.1 kU/L
Elm IgE: <0.1 kU/L
IGE (IMMUNOGLOBULIN E), SERUM: 14 kU/L (ref <115)
Johnson Grass: <0.1 kU/L
Pecan/Hickory Tree IgE: <0.1 kU/L
Sheep Sorrel IgE: <0.1 kU/L

## 2016-07-06 NOTE — Telephone Encounter (Signed)
Notes Recorded by Javier Glazier, MD on 07/05/2016 at 6:50 PM EDT Please let the patient know that I reviewed her chest x-ray and there is no change or sign of pneumonia. Thank you. ---------------  Labs are still pending.  lmtcb X1 for pt to relay cxr results.

## 2016-07-06 NOTE — Telephone Encounter (Signed)
Pt is aware of results. Nothing further was needed. 

## 2016-07-06 NOTE — Telephone Encounter (Signed)
Pt would like a referral to a hematologist and an oncologist.

## 2016-07-07 NOTE — Telephone Encounter (Signed)
Spoke to pt, asked her why she needs referral to Hematology/Oncology cause she was already seeing someone. Pt said she can not see that provider anymore cause they sent her a letter stating due to no show appts they would not see her. Pt said she missed the appts due to her son was in a car accident and was out of town. Told her okay I will send new referral and someone will contact her to schedule an appt. Pt verbalized understanding. Dr.K made aware referral done.

## 2016-07-12 DIAGNOSIS — M5137 Other intervertebral disc degeneration, lumbosacral region: Secondary | ICD-10-CM | POA: Diagnosis not present

## 2016-07-12 DIAGNOSIS — M47817 Spondylosis without myelopathy or radiculopathy, lumbosacral region: Secondary | ICD-10-CM | POA: Diagnosis not present

## 2016-07-12 DIAGNOSIS — Z79899 Other long term (current) drug therapy: Secondary | ICD-10-CM | POA: Diagnosis not present

## 2016-07-12 DIAGNOSIS — M4696 Unspecified inflammatory spondylopathy, lumbar region: Secondary | ICD-10-CM | POA: Diagnosis not present

## 2016-07-12 DIAGNOSIS — Z79891 Long term (current) use of opiate analgesic: Secondary | ICD-10-CM | POA: Diagnosis not present

## 2016-07-12 DIAGNOSIS — G894 Chronic pain syndrome: Secondary | ICD-10-CM | POA: Diagnosis not present

## 2016-07-18 DIAGNOSIS — C44722 Squamous cell carcinoma of skin of right lower limb, including hip: Secondary | ICD-10-CM | POA: Diagnosis not present

## 2016-07-31 ENCOUNTER — Ambulatory Visit: Payer: Medicare Other

## 2016-07-31 ENCOUNTER — Other Ambulatory Visit (HOSPITAL_BASED_OUTPATIENT_CLINIC_OR_DEPARTMENT_OTHER): Payer: Medicare Other

## 2016-07-31 ENCOUNTER — Ambulatory Visit (HOSPITAL_BASED_OUTPATIENT_CLINIC_OR_DEPARTMENT_OTHER): Payer: Medicare Other | Admitting: Hematology & Oncology

## 2016-07-31 ENCOUNTER — Encounter: Payer: Self-pay | Admitting: Hematology & Oncology

## 2016-07-31 VITALS — BP 156/97 | HR 82 | Temp 98.6°F | Wt 234.0 lb

## 2016-07-31 DIAGNOSIS — D509 Iron deficiency anemia, unspecified: Secondary | ICD-10-CM

## 2016-07-31 DIAGNOSIS — C50011 Malignant neoplasm of nipple and areola, right female breast: Secondary | ICD-10-CM

## 2016-07-31 DIAGNOSIS — Z79811 Long term (current) use of aromatase inhibitors: Secondary | ICD-10-CM

## 2016-07-31 DIAGNOSIS — D0511 Intraductal carcinoma in situ of right breast: Secondary | ICD-10-CM

## 2016-07-31 DIAGNOSIS — K439 Ventral hernia without obstruction or gangrene: Secondary | ICD-10-CM | POA: Diagnosis not present

## 2016-07-31 LAB — CMP (CANCER CENTER ONLY)
ALBUMIN: 3.4 g/dL (ref 3.3–5.5)
ALT(SGPT): 41 U/L (ref 10–47)
AST: 35 U/L (ref 11–38)
Alkaline Phosphatase: 135 U/L — ABNORMAL HIGH (ref 26–84)
BILIRUBIN TOTAL: 0.5 mg/dL (ref 0.20–1.60)
BUN: 9 mg/dL (ref 7–22)
CHLORIDE: 103 meq/L (ref 98–108)
CO2: 27 meq/L (ref 18–33)
CREATININE: 0.7 mg/dL (ref 0.6–1.2)
Calcium: 9.4 mg/dL (ref 8.0–10.3)
Glucose, Bld: 105 mg/dL (ref 73–118)
Potassium: 4.1 mEq/L (ref 3.3–4.7)
SODIUM: 136 meq/L (ref 128–145)
TOTAL PROTEIN: 7.1 g/dL (ref 6.4–8.1)

## 2016-07-31 LAB — CBC WITH DIFFERENTIAL (CANCER CENTER ONLY)
BASO#: 0 10*3/uL (ref 0.0–0.2)
BASO%: 0.2 % (ref 0.0–2.0)
EOS%: 2.8 % (ref 0.0–7.0)
Eosinophils Absolute: 0.2 10*3/uL (ref 0.0–0.5)
HCT: 36 % (ref 34.8–46.6)
HEMOGLOBIN: 11.2 g/dL — AB (ref 11.6–15.9)
LYMPH#: 1.5 10*3/uL (ref 0.9–3.3)
LYMPH%: 27.3 % (ref 14.0–48.0)
MCH: 24.7 pg — AB (ref 26.0–34.0)
MCHC: 31.1 g/dL — ABNORMAL LOW (ref 32.0–36.0)
MCV: 80 fL — ABNORMAL LOW (ref 81–101)
MONO#: 0.6 10*3/uL (ref 0.1–0.9)
MONO%: 10.2 % (ref 0.0–13.0)
NEUT%: 59.5 % (ref 39.6–80.0)
NEUTROS ABS: 3.2 10*3/uL (ref 1.5–6.5)
PLATELETS: 224 10*3/uL (ref 145–400)
RBC: 4.53 10*6/uL (ref 3.70–5.32)
RDW: 18.5 % — ABNORMAL HIGH (ref 11.1–15.7)
WBC: 5.4 10*3/uL (ref 3.9–10.0)

## 2016-07-31 LAB — CHCC SATELLITE - SMEAR

## 2016-07-31 LAB — LACTATE DEHYDROGENASE: LDH: 216 U/L (ref 125–245)

## 2016-08-01 ENCOUNTER — Telehealth: Payer: Self-pay | Admitting: *Deleted

## 2016-08-01 LAB — FERRITIN: Ferritin: 19 ng/ml (ref 9–269)

## 2016-08-01 LAB — IRON AND TIBC
%SAT: 10 % — AB (ref 21–57)
Iron: 33 ug/dL — ABNORMAL LOW (ref 41–142)
TIBC: 326 ug/dL (ref 236–444)
UIBC: 294 ug/dL (ref 120–384)

## 2016-08-01 LAB — RETICULOCYTES: Reticulocyte Count: 1.4 % (ref 0.6–2.6)

## 2016-08-01 NOTE — Progress Notes (Signed)
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 From June 2014 to present, she was noted to be progressively anemic to the point requiring blood transfusion in February 2015.  The patient had undergone extensive evaluation in [place] prior to moving to [place]. Unfortunately I do not have records of those. According to her, she had EGD and colonoscopy and had received multiple units of blood transfusion last year. According to the patient, the hemoglobin dropped to 3 g at some point in time. She had received blood transfusion twice. With the most recent blood transfusion, according to the patient, she had a transfusion reaction. According to the blood typing on 11/19/2013, warm antibody was detected. The patient was prescribed oral iron supplements and she takes it 3 times a day with an empty stomach. She received 1 dose of intravenous iron on 12/09/13 and again on 10/05/14. She has reduced her iron tablet down to one a day.  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 Malignant neoplasm of upper-outer quadrant of female breast   Primary site: Breast (Right)   Staging method: AJCC 7th Edition   Pathologic: Stage IA (T1, N0, cM0) signed by Victorino December, MD on 03/26/2013  8:21 AM   Summary: Stage IA (T1, N0, cM0) In January 2014 she had a mammogram performed that showed an abnormality in the right breast. This was in [place]. 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 Patient went on to have a right lumpectomy performed revealing a T1cN0 disease stage at diagnosis stage I. She subsequently had Oncotype DX performed that showed a recurrence score of 23 giving her a 10 year risk for recurrence but 10-30% in the intermediate risk category. Because of this she received adjuvant chemotherapy consisting of 4 cycles of Taxotere and Cytoxan. She tolerated it well. She completed adjuvant radiation therapy from 03/04/2013 through 04/17/2013. She started taking Arimidex since 05/16/2013.  INTERVAL HISTORY: Please see below for problem oriented charting. She feels well, but tearful today since she has received a letter indicating that her primary oncologist is discharging her due to missed appointments. Has 30 days to find another oncologist.  She is morbidly obese and is attempting to lose weight. She has started exercise program. The patient denies any recent signs or symptoms of bleeding such as spontaneous epistaxis, hematuria or hematochezia. She denies any recent abnormal breast examination, palpable mass, abnormal breast appearance or nipple changes  REVIEW OF SYSTEMS:   Constitutional: Denies fevers, chills or abnormal weight loss Eyes: Denies blurriness of vision Ears, nose, mouth, throat, and face: Denies mucositis or sore throat Respiratory: Denies cough, dyspnea or wheezes Cardiovascular: Denies palpitation, chest discomfort or lower extremity swelling Gastrointestinal:  Denies nausea, heartburn or change in bowel habits Skin: Denies abnormal skin rashes Lymphatics: Denies new lymphadenopathy or easy bruising Neurological:Denies numbness, tingling or new weaknesses Behavioral/Psych:  Mood is stable, no new changes  All other systems were reviewed with the patient and are  negative.  I have reviewed the past medical history, past surgical history, social history and family history with the patient and they are unchanged from previous note.  ALLERGIES:  is allergic to contrast media [iodinated diagnostic agents]; solu-medrol [methylprednisolone acetate]; albuterol; and prednisone.  MEDICATIONS:  Current Outpatient Prescriptions  Medication Sig Dispense Refill  . albuterol (PROVENTIL HFA;VENTOLIN HFA) 108 (90 Base) MCG/ACT inhaler Inhale 2 puffs into the lungs every 6 (six) hours as needed for wheezing or shortness of breath. 1 Inhaler 2  . anastrozole (ARIMIDEX) 1 MG tablet TAKE 1 TABLET(1 MG) BY MOUTH DAILY 90 tablet 0  . atorvastatin (LIPITOR) 20 MG tablet Take 1 tablet (20 mg total) by mouth daily. 90 tablet 1  . budesonide-formoterol (SYMBICORT) 80-4.5 MCG/ACT inhaler Inhale 2 puffs into the lungs 2 (two) times daily. 1 Inhaler 3  . budesonide-formoterol (SYMBICORT) 80-4.5 MCG/ACT inhaler Inhale 2 puffs into the lungs 2 (two) times daily. 1 Inhaler 0  . carvedilol (COREG) 6.25 MG tablet Take 1 tablet (6.25 mg total) by mouth 2 (two) times daily with a meal. 180 tablet 3  . cyclobenzaprine (FLEXERIL) 10 MG tablet Take 10 mg by mouth at bedtime.   6  . docusate sodium 100 MG CAPS Take 100 mg by mouth 2 (two) times daily. (Patient taking differently: Take 100 mg by mouth daily as needed (mild constipation). ) 10 capsule 0  . escitalopram (LEXAPRO) 10 MG tablet Take 1 tablet (10 mg total) by mouth 2 (two) times daily. 180 tablet 0  . furosemide (LASIX) 80 MG tablet TAKE 1 TABLET BY MOUTH TWICE DAILY (Patient taking differently: TAKE 80 MG BY MOUTH TWICE DAILY AS NEEDED FOR FLUID GAIN) 180 tablet 0  . guaiFENesin-dextromethorphan (ROBITUSSIN DM) 100-10 MG/5ML syrup Take 10 mLs by mouth every 4 (four) hours as needed for cough. 118 mL 0  . Lidocaine 0.5 % GEL Apply 1 application topically 3 (three) times daily.    Marland Kitchen lisinopril (PRINIVIL,ZESTRIL) 5 MG tablet TAKE 1  TABLET BY MOUTH DAILY 30 tablet 5  . LORazepam (ATIVAN) 0.5 MG tablet Take 1 tablet (0.5 mg total) by mouth every 8 (eight) hours as needed. for anxiety 270 tablet 1  . lubiprostone (AMITIZA) 24 MCG capsule Take 1 capsule (24 mcg total) by mouth 2 (two) times daily with a meal. 60 capsule 2  . metolazone (ZAROXOLYN) 2.5 MG tablet Take 1 tablet (2.5 mg total) by mouth daily as needed (fluid gain).    . montelukast (SINGULAIR) 5 MG chewable tablet Chew 1 tablet (5 mg total) by mouth at bedtime. 30 tablet 3  . oxyCODONE-acetaminophen (PERCOCET) 10-325 MG tablet Take 1 tablet by mouth every 6 (six) hours as needed for pain. 15 tablet 0  . oxymorphone (OPANA ER) 30 MG 12 hr tablet Take 1 tablet by mouth 2 (two) times daily.   0  . potassium chloride SA (K-DUR,KLOR-CON) 20 MEQ tablet Take 1 tablet (20 mEq total) by mouth 3 (three) times daily. 270 tablet 1  . ranitidine (ZANTAC) 150 MG tablet Take 1 tablet (150 mg total) by mouth at bedtime. 30 tablet 3   No current facility-administered medications for this visit.     PHYSICAL EXAMINATION: ECOG PERFORMANCE STATUS: 1 - Symptomatic but completely ambulatory  Vitals:   07/31/16 1354  BP: (!) 156/97  Pulse: 82  Temp: 98.6 F (37 C)   Filed Weights   07/31/16  1354  Weight: 234 lb (106.1 kg)    GENERAL:alert, no distress and comfortable. She is morbidly obese SKIN: skin color, texture, turgor are normal, no rashes or significant lesions EYES: normal, Conjunctiva are pink and non-injected, sclera clear OROPHARYNX:no exudate, no erythema and lips, buccal mucosa, and tongue normal  NECK: supple, thyroid normal size, non-tender, without nodularity LYMPH:  no palpable lymphadenopathy in the cervical, axillary or inguinal LUNGS: clear to auscultation and percussion with normal breathing effort HEART: regular rate & rhythm and no murmurs and no lower extremity edema ABDOMEN:abdomen soft, non-tender and normal bowel sounds Musculoskeletal:no  cyanosis of digits and no clubbing  NEURO: alert & oriented x 3 with fluent speech, no focal motor/sensory deficits Breasts: Well-healed lumpectomy scar in the right breast, no other abnormalities  LABORATORY DATA:  I have reviewed the data as listed    Component Value Date/Time   NA 136 07/31/2016 1339   NA 139 03/26/2015 1457   K 4.1 07/31/2016 1339   K 4.3 03/26/2015 1457   CL 103 07/31/2016 1339   CL 99 03/04/2013 1239   CO2 27 07/31/2016 1339   CO2 29 03/26/2015 1457   GLUCOSE 105 07/31/2016 1339   GLUCOSE 125 (H) 03/04/2013 1239   BUN 9 07/31/2016 1339   BUN 21.1 03/26/2015 1457   CREATININE 0.7 07/31/2016 1339   CREATININE 1.0 03/26/2015 1457   CALCIUM 9.4 07/31/2016 1339   CALCIUM 9.6 03/26/2015 1457   PROT 7.1 07/31/2016 1339   PROT 7.7 01/01/2014 1400   ALBUMIN 3.4 07/31/2016 1339   ALBUMIN 3.7 01/01/2014 1400   AST 35 07/31/2016 1339   AST 32 01/01/2014 1400   ALT 41 07/31/2016 1339   ALT 29 01/01/2014 1400   ALKPHOS 135 (H) 07/31/2016 1339   ALKPHOS 155 (H) 01/01/2014 1400   BILITOT 0.50 07/31/2016 1339   BILITOT 0.25 01/01/2014 1400   GFRNONAA >60 04/26/2016 0559   GFRAA >60 04/26/2016 0559    No results found for: SPEP, UPEP  Lab Results  Component Value Date   WBC 5.4 07/31/2016   NEUTROABS 3.2 07/31/2016   HGB 11.2 (L) 07/31/2016   HCT 36.0 07/31/2016   MCV 80 (L) 07/31/2016   PLT 224 07/31/2016      Chemistry      Component Value Date/Time   NA 136 07/31/2016 1339   NA 139 03/26/2015 1457   K 4.1 07/31/2016 1339   K 4.3 03/26/2015 1457   CL 103 07/31/2016 1339   CL 99 03/04/2013 1239   CO2 27 07/31/2016 1339   CO2 29 03/26/2015 1457   BUN 9 07/31/2016 1339   BUN 21.1 03/26/2015 1457   CREATININE 0.7 07/31/2016 1339   CREATININE 1.0 03/26/2015 1457      Component Value Date/Time   CALCIUM 9.4 07/31/2016 1339   CALCIUM 9.6 03/26/2015 1457   ALKPHOS 135 (H) 07/31/2016 1339   ALKPHOS 155 (H) 01/01/2014 1400   AST 35 07/31/2016 1339    AST 32 01/01/2014 1400   ALT 41 07/31/2016 1339   ALT 29 01/01/2014 1400   BILITOT 0.50 07/31/2016 1339   BILITOT 0.25 01/01/2014 1400     Recent mammogram was negative. ASSESSMENT & PLAN:  No problem-specific Assessment & Plan notes found for this encounter.   Orders Placed This Encounter  Procedures  . CBC with Differential ([facility])    Standing Status:   Future    Number of Occurrences:   1    Standing Expiration Date:  07/31/2017  . CMP STAT ([facility] only)    Standing Status:   Future    Number of Occurrences:   1    Standing Expiration Date:   07/31/2017  . Lactate dehydrogenase    Standing Status:   Future    Number of Occurrences:   1    Standing Expiration Date:   07/31/2017  . Ferritin    Standing Status:   Future    Number of Occurrences:   1    Standing Expiration Date:   07/31/2017  . Reticulocytes    Standing Status:   Future    Number of Occurrences:   1    Standing Expiration Date:   07/31/2017  . [facility] - Smear    Standing Status:   Future    Number of Occurrences:   1    Standing Expiration Date:   07/31/2017  . Iron and TIBC    Standing Status:   Future    Number of Occurrences:   1    Standing Expiration Date:   07/31/2017  . CBC with Differential [facility])    Standing Status:   Future    Standing Expiration Date:   07/31/2017  . CMP STAT ([place] only)    Standing Status:   Future    Standing Expiration Date:   07/31/2017  . Ferritin    Standing Status:   Future    Standing Expiration Date:   07/31/2017  . Iron and TIBC    Standing Status:   Future    Standing Expiration Date:   07/31/2017   Referral made to establish with new oncologist. All questions were answered. The patient knows to call the clinic with any problems, questions or concerns. No barriers to learning was detected. I spent 15 minutes counseling the patient face to face. The total time spent in the appointment was 20  minutes and more than 50% was on counseling and review of test results     Volanda Napoleon, MD 08/01/2016 7:21 AM

## 2016-08-01 NOTE — Telephone Encounter (Addendum)
Patient aware of results. Transferred to scheduler  ----- Message from Volanda Napoleon, MD sent at 08/01/2016  9:34 AM EST ----- Call - iron is very low!!  Need 2 doses of Feraheme.  Please set up!!  pete

## 2016-08-03 NOTE — Progress Notes (Signed)
Hematology and Oncology Follow Up Visit  Alexandria [name] [id number] 07/25/1951 65 y.o. 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 She was seen at the main [facility]. Because of logistics, it is easier for her to see [place] in our office. Patient has a history of stage IA breast cancer of the right breast. This was back in January 2014. She was up in [place]. She had a lumpectomy. Because of her Oncotype score, she had 4 cycles of chemotherapy with Taxotere/Cytoxan. She then had adjuvant radiation therapy that is completed and July 2014. She now is on Arimidex.  She is doing well with respect to the breast cancer.  Her issue has been recurrent iron deficiency. She has a large hilar hernia. She has a abdominal wall hernia. Unsure that she probably is losing some blood. She said that she's had blood transfusions before.  Today, her ferritin was 19 with iron saturation of 10%.  She's not had any obvious melena or bright red blood per rectum.  She does have some other health issues. She is on quite a few medications. She does have some irritable bowel issues.  She is on some pain medication. Other she does see a pain clinic.  Overall, her performance status is ECOG 1.    Medications:  Current Outpatient Prescriptions:  .  albuterol (PROVENTIL HFA;VENTOLIN HFA) 108 (90 Base) MCG/ACT inhaler, Inhale 2 puffs into the lungs every 6 (six) hours as needed for wheezing or shortness of breath., Disp: 1 Inhaler, Rfl: 2 .  anastrozole (ARIMIDEX) 1 MG tablet, TAKE 1 TABLET(1 MG) BY MOUTH DAILY, Disp: 90 tablet, Rfl: 0 .  atorvastatin (LIPITOR) 20 MG tablet, Take 1 tablet (20 mg total) by mouth daily., Disp: 90 tablet, Rfl: 1 .  budesonide-formoterol (SYMBICORT)  80-4.5 MCG/ACT inhaler, Inhale 2 puffs into the lungs 2 (two) times daily., Disp: 1 Inhaler, Rfl: 3 .  budesonide-formoterol (SYMBICORT) 80-4.5 MCG/ACT inhaler, Inhale 2 puffs into the lungs 2 (two) times daily., Disp: 1 Inhaler, Rfl: 0 .  carvedilol (COREG) 6.25 MG tablet, Take 1 tablet (6.25 mg total) by mouth 2 (two) times daily with a meal., Disp: 180 tablet, Rfl: 3 .  cyclobenzaprine (FLEXERIL) 10 MG tablet, Take 10 mg by mouth at bedtime. , Disp: , Rfl: 6 .  docusate sodium 100 MG CAPS, Take 100 mg by mouth 2 (two) times daily. (Patient taking differently: Take 100 mg by mouth daily as needed (mild constipation). ), Disp: 10 capsule, Rfl: 0 .  escitalopram (LEXAPRO) 10 MG tablet, Take 1 tablet (10 mg total) by mouth 2 (two) times daily., Disp: 180 tablet, Rfl: 0 .  furosemide (LASIX) 80 MG tablet, TAKE 1 TABLET BY MOUTH TWICE DAILY (Patient taking differently: TAKE 80 MG BY MOUTH TWICE DAILY AS NEEDED FOR FLUID GAIN), Disp: 180 tablet, Rfl: 0 .  guaiFENesin-dextromethorphan (ROBITUSSIN DM) 100-10 MG/5ML syrup, Take 10 mLs by mouth every 4 (four) hours as needed for cough., Disp: 118 mL, Rfl: 0 .  Lidocaine 0.5 % GEL, Apply 1 application topically 3 (three) times daily., Disp: , Rfl:  .  lisinopril (PRINIVIL,ZESTRIL) 5 MG tablet, TAKE 1 TABLET BY MOUTH DAILY, Disp: 30 tablet, Rfl: 5 .  LORazepam (ATIVAN) 0.5 MG tablet, Take 1 tablet (0.5 mg total) by mouth every 8 (eight) hours as needed. for anxiety, Disp: 270 tablet, Rfl: 1 .  lubiprostone (AMITIZA) 24 MCG capsule, Take 1 capsule (24 mcg total) by mouth 2 (two) times daily with a meal., Disp: 60 capsule, Rfl: 2 .  metolazone (ZAROXOLYN) 2.5 MG tablet, Take 1 tablet (2.5 mg total) by mouth daily as needed (fluid gain)., Disp: , Rfl:  .  montelukast (SINGULAIR) 5 MG chewable tablet, Chew 1 tablet (5 mg total) by mouth at bedtime., Disp: 30 tablet, Rfl: 3 .  oxyCODONE-acetaminophen (PERCOCET) 10-325 MG tablet, Take 1 tablet by mouth every 6 (six)  hours as needed for pain., Disp: 15 tablet, Rfl: 0 .  oxymorphone (OPANA ER) 30 MG 12 hr tablet, Take 1 tablet by mouth 2 (two) times daily. , Disp: , Rfl: 0 .  potassium chloride SA (K-DUR,KLOR-CON) 20 MEQ tablet, Take 1 tablet (20 mEq total) by mouth 3 (three) times daily., Disp: 270 tablet, Rfl: 1 .  ranitidine (ZANTAC) 150 MG tablet, Take 1 tablet (150 mg total) by mouth at bedtime., Disp: 30 tablet, Rfl: 3  Allergies:  Allergies  Allergen Reactions  . Contrast Media [Iodinated Diagnostic Agents] Anaphylaxis  . Solu-Medrol [Methylprednisolone Acetate] Anaphylaxis  . Albuterol Other (See Comments)    Anxious   . Prednisone Other (See Comments)    Pt gets very agitated when she takes high doses of steroids    Past Medical History, Surgical history, Social history, and Family History were reviewed and updated.  Review of Systems:  As above  Physical Exam:  weight is 234 lb (106.1 kg). Her oral temperature is 98.6 F (37 C). Her blood pressure is 156/97 (abnormal) and her pulse is 82.   Wt Readings from Last 3 Encounters:  07/31/16 234 lb (106.1 kg)  07/05/16 239 lb (108.4 kg)  07/03/16 240 lb (108.9 kg)      Well-developed well-nourished white female in no obvious distress. Head exam shows no ocular or oral lesion. She has no palpable cervical or supraclavicular lymph nodes. Lungs are clear. Cardiac exam regular rate and rhythm with no murmurs, rubs or bruits. Abdomen is soft. She has good bowel sounds. There is no fluid wave. She does have a abdominal wall hernia. There may be some slight tenderness in the epigastric area. There is no palpable liver or spleen tip. Breast exam shows left breast with no masses, edema or erythema. There is a left axillary adenopathy. Right breast shows well-healed lumpectomy at the takeoff position adjacent to the Ariel up. She has some radiation telangiectasias. There is some retraction of the right breast. There is some slight firmness. There is no  distinct mass. There is no right axillary adenopathy. Back exam shows some slight kyphosis. Extremities shows some slight edema in her lower legs. Skin exam shows no rashes. Neurological exam shows no focal neurological deficits.  Lab Results  Component Value Date   WBC 5.4 07/31/2016   HGB 11.2 (L) 07/31/2016   HCT 36.0 07/31/2016   MCV 80 (L) 07/31/2016   PLT 224 07/31/2016     Chemistry      Component Value Date/Time   NA 136 07/31/2016 1339   NA 139 03/26/2015 1457   K 4.1 07/31/2016 1339   K 4.3 03/26/2015 1457   CL 103 07/31/2016 1339   CL 99 03/04/2013 1239   CO2 27 07/31/2016 1339   CO2 29 03/26/2015 1457   BUN 9 07/31/2016  1339   BUN 21.1 03/26/2015 1457   CREATININE 0.7 07/31/2016 1339   CREATININE 1.0 03/26/2015 1457      Component Value Date/Time   CALCIUM 9.4 07/31/2016 1339   CALCIUM 9.6 03/26/2015 1457   ALKPHOS 135 (H) 07/31/2016 1339   ALKPHOS 155 (H) 01/01/2014 1400   AST 35 07/31/2016 1339   AST 32 01/01/2014 1400   ALT 41 07/31/2016 1339   ALT 29 01/01/2014 1400   BILITOT 0.50 07/31/2016 1339   BILITOT 0.25 01/01/2014 1400         Impression and Plan: Ms. 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 She has a history of stage I ductal carcinoma of the right breast. This really should not be a problem for her. She is on Arimidex. We will continue her on the Arimidex. I think that she probably just needs 5 years. I let sure how much more benefit she would get with 10 years area  She will need some iron. We'll see back in her set up with some IV iron.  If she needs surgery for the hernia, I will see a problem with her having this.  I will plan to get her back in about 3 months. I think if all looks good in 3 months, then we can get her back in 6 months.  I spent about 45 minutes with her.   Volanda Napoleon, MD 11/16/20177:25 AM

## 2016-08-04 ENCOUNTER — Other Ambulatory Visit: Payer: Self-pay | Admitting: Family

## 2016-08-04 ENCOUNTER — Ambulatory Visit (HOSPITAL_BASED_OUTPATIENT_CLINIC_OR_DEPARTMENT_OTHER): Payer: Medicare Other

## 2016-08-04 VITALS — BP 111/86 | HR 118 | Temp 98.1°F | Resp 20

## 2016-08-04 DIAGNOSIS — D509 Iron deficiency anemia, unspecified: Secondary | ICD-10-CM | POA: Diagnosis not present

## 2016-08-04 MED ORDER — SODIUM CHLORIDE 0.9 % IV SOLN
510.0000 mg | Freq: Once | INTRAVENOUS | Status: AC
  Administered 2016-08-04: 510 mg via INTRAVENOUS
  Filled 2016-08-04: qty 17

## 2016-08-04 MED ORDER — SODIUM CHLORIDE 0.9 % IV SOLN
Freq: Once | INTRAVENOUS | Status: AC
  Administered 2016-08-04: 16:00:00 via INTRAVENOUS

## 2016-08-04 NOTE — Patient Instructions (Signed)

## 2016-08-09 DIAGNOSIS — G894 Chronic pain syndrome: Secondary | ICD-10-CM | POA: Diagnosis not present

## 2016-08-09 DIAGNOSIS — M5137 Other intervertebral disc degeneration, lumbosacral region: Secondary | ICD-10-CM | POA: Diagnosis not present

## 2016-08-09 DIAGNOSIS — M47817 Spondylosis without myelopathy or radiculopathy, lumbosacral region: Secondary | ICD-10-CM | POA: Diagnosis not present

## 2016-08-09 DIAGNOSIS — M4696 Unspecified inflammatory spondylopathy, lumbar region: Secondary | ICD-10-CM | POA: Diagnosis not present

## 2016-08-09 DIAGNOSIS — Z79899 Other long term (current) drug therapy: Secondary | ICD-10-CM | POA: Diagnosis not present

## 2016-08-09 DIAGNOSIS — Z79891 Long term (current) use of opiate analgesic: Secondary | ICD-10-CM | POA: Diagnosis not present

## 2016-08-14 ENCOUNTER — Ambulatory Visit: Payer: Self-pay | Admitting: Adult Health

## 2016-08-14 ENCOUNTER — Encounter (HOSPITAL_COMMUNITY): Payer: Self-pay

## 2016-08-15 ENCOUNTER — Other Ambulatory Visit: Payer: Self-pay | Admitting: Internal Medicine

## 2016-08-15 ENCOUNTER — Ambulatory Visit (HOSPITAL_BASED_OUTPATIENT_CLINIC_OR_DEPARTMENT_OTHER): Payer: Medicare Other

## 2016-08-15 DIAGNOSIS — D509 Iron deficiency anemia, unspecified: Secondary | ICD-10-CM

## 2016-08-15 DIAGNOSIS — C50911 Malignant neoplasm of unspecified site of right female breast: Secondary | ICD-10-CM

## 2016-08-15 MED ORDER — SODIUM CHLORIDE 0.9 % IV SOLN
Freq: Once | INTRAVENOUS | Status: AC
  Administered 2016-08-15: 13:00:00 via INTRAVENOUS

## 2016-08-15 MED ORDER — SODIUM CHLORIDE 0.9 % IV SOLN
510.0000 mg | Freq: Once | INTRAVENOUS | Status: AC
  Administered 2016-08-15: 510 mg via INTRAVENOUS
  Filled 2016-08-15: qty 17

## 2016-08-15 NOTE — Patient Instructions (Signed)
Ferumoxytol injection What is this medicine? FERUMOXYTOL is an iron complex. Iron is used to make healthy red blood cells, which carry oxygen and nutrients throughout the body. This medicine is used to treat iron deficiency anemia in people with chronic kidney disease. COMMON BRAND NAME(S): Feraheme What should I tell my health care provider before I take this medicine? They need to know if you have any of these conditions: -anemia not caused by low iron levels -high levels of iron in the blood -magnetic resonance imaging (MRI) test scheduled -an unusual or allergic reaction to iron, other medicines, foods, dyes, or preservatives -pregnant or trying to get pregnant -breast-feeding How should I use this medicine? This medicine is for injection into a vein. It is given by a health care professional in a hospital or clinic setting. Talk to your pediatrician regarding the use of this medicine in children. Special care may be needed. What if I miss a dose? It is important not to miss your dose. Call your doctor or health care professional if you are unable to keep an appointment. What may interact with this medicine? This medicine may interact with the following medications: -other iron products What should I watch for while using this medicine? Visit your doctor or healthcare professional regularly. Tell your doctor or healthcare professional if your symptoms do not start to get better or if they get worse. You may need blood work done while you are taking this medicine. You may need to follow a special diet. Talk to your doctor. Foods that contain iron include: whole grains/cereals, dried fruits, beans, or peas, leafy green vegetables, and organ meats (liver, kidney). What side effects may I notice from receiving this medicine? Side effects that you should report to your doctor or health care professional as soon as possible: -allergic reactions like skin rash, itching or hives, swelling of the  face, lips, or tongue -breathing problems -changes in blood pressure -feeling faint or lightheaded, falls -fever or chills -flushing, sweating, or hot feelings -swelling of the ankles or feet Side effects that usually do not require medical attention (report to your doctor or health care professional if they continue or are bothersome): -diarrhea -headache -nausea, vomiting -stomach pain Where should I keep my medicine? This drug is given in a hospital or clinic and will not be stored at home.  2017 Elsevier/Gold Standard (2015-10-07 12:41:49)  

## 2016-08-16 ENCOUNTER — Telehealth: Payer: Self-pay | Admitting: *Deleted

## 2016-08-16 ENCOUNTER — Ambulatory Visit: Payer: Medicare Other | Admitting: Cardiovascular Disease

## 2016-08-16 NOTE — Telephone Encounter (Signed)
Patient had second iron infusion yesterday. Last pm she began to experience vomiting. Today the vomiting has resolved but she is now experiencing stomach cramping and discomfort. She would like to know if this is related to her iron infusion.  Spoke with Dr Marin Olp who states that there may be a connection. He would like patient to try dramamine for nausea and pepto/kaeopectate for stomach discomfort.   Patient aware of instructions.

## 2016-08-21 ENCOUNTER — Ambulatory Visit (INDEPENDENT_AMBULATORY_CARE_PROVIDER_SITE_OTHER): Payer: Medicare Other | Admitting: Adult Health

## 2016-08-21 ENCOUNTER — Encounter: Payer: Self-pay | Admitting: Adult Health

## 2016-08-21 DIAGNOSIS — J4520 Mild intermittent asthma, uncomplicated: Secondary | ICD-10-CM | POA: Diagnosis not present

## 2016-08-21 DIAGNOSIS — J45909 Unspecified asthma, uncomplicated: Secondary | ICD-10-CM | POA: Insufficient documentation

## 2016-08-21 NOTE — Assessment & Plan Note (Signed)
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  May need to change ACE if cough returns  Plan  Patient Instructions  Continue on Singulair daily  Lisinopril may aggravate cough , can discuss if cough returns that this medication may make it worse.  Follow up Dr. Ashok Cordia in 2-3 months and As needed

## 2016-08-21 NOTE — Patient Instructions (Addendum)
Continue on Singulair daily  Lisinopril may aggravate cough , can discuss if cough returns that this medication may make it worse.  Follow up Dr. Ashok Cordia in 2-3 months and As needed

## 2016-08-21 NOTE — Addendum Note (Signed)
Addended by: Doroteo Glassman D on: 08/21/2016 02:31 PM   Modules accepted: Orders

## 2016-08-21 NOTE — Progress Notes (Signed)
Subjective:    Patient ID: Alexandria [name], female    DOB: Apr 12, 1951, 65 y.o.   MRN: [id number]  HPI 65 yo obese female never smoker seen for pulmonary consult in  May 2015 for dyspnea and mediastinal adenopathy.   TEST Alexandria [name]  2015  ONO started on O2 At bedtime  .  Placed on O2 at 2l/m with sats 94%.  Previous CT chest showed mild mediastinal lymphadenopathy and was repeated in 06/2014 that showed stable mediastinal lymph nodes.  CT of her chest that shows at least 50% of her stomach in her left chest, with significant compromise of left lung volume.   Hx breast cancer diagnosed in January 2014  and underwent right lumpectomy and radiation therapy that finished in July of 2014 . She also has a history of a pulmonary embolus in the distant past, but had a ventilation perfusion scan 2014  that was unremarkable.  >admit  08/21/2016 Follow up : Asthma  Pt returns for a 2 month follow up . Seen last ov with cough and dyspnea.  She was started on Symbicort and Singulair . Says she did not take symbicort.  She did take Singulair and feels much better. Cough has resolved totally . Feels breathing is better. Had recent foot surgery for skin cancer.  She denies chest pain, orthopnea, or hemoptysis .  She is on an ACE inhibitor.     Past Medical History:  Diagnosis Date  . Anxiety   . Breast cancer (Alexandria [name]) 10/02/12   Invasive Ductal Carcinoma of the Right Upper Outer Quadrant - ER (>90%), PR - Neg., Her2 Neu Negative, Ki-67 Unknown  . Chronic back pain    "all the way up and down"  . Chronic bronchitis ([place])    "get it q yr" (09/07/2014)  . Complication of anesthesia    "I come out really anxious; need Ativan to ease me out" (09/07/2014)  . Daily headache    "recently" (09/07/2014)  . DDD (degenerative disc disease), cervical   . DDD (degenerative disc disease), lumbosacral   . DDD (degenerative disc disease), thoracolumbar   . Diastolic dysfunction   . Diastolic heart failure (Alexandria [name])   .  Fibromyalgia   . GAD (generalized anxiety disorder)   . H/O hiatal hernia   . History of blood transfusion "3-4"   "never can tell why; don't know where the blood was coming from; doesn't show up in stool or urine"  . Hx of pulmonary embolus    During c-Section  . Hx of radiation therapy 03/04/13- 04/17/13   right breast 50 Gy 25 fractions, right breast boost 10 Gy 5 fractions  . Hyperlipidemia   . Hypertension   . Iron deficiency anemia   . Lymphedema    bilateral lower extremity  . Obesity    Class 2  . On home oxygen therapy    "2L just at night" (09/07/2014)  . Peripheral neuropathic pain   . Pneumonia    "at least twice/yr" (09/07/2014)  . Recurrent major depression ([name]) 06/2014   Seen by Dr. Louretta Shorten  . S/P radiation therapy  03/04/2013-04/17/2013   1) Right breast / 50 Gy in 25 fractions/ 2) Right breast boost / 10 Gy in 5 fractions  . Status post chemotherapy    4 cycles of Taxotere and cytoxan   Current Outpatient Prescriptions on File Prior to Visit  Medication Sig Dispense Refill  . albuterol (PROVENTIL HFA;VENTOLIN HFA) 108 (90 Base) MCG/ACT inhaler Inhale 2 puffs into the  lungs every 6 (six) hours as needed for wheezing or shortness of breath. 1 Inhaler 2  . anastrozole (ARIMIDEX) 1 MG tablet TAKE 1 TABLET(1 MG) BY MOUTH DAILY 90 tablet 0  . atorvastatin (LIPITOR) 20 MG tablet Take 1 tablet (20 mg total) by mouth daily. 90 tablet 1  . carvedilol (COREG) 6.25 MG tablet Take 1 tablet (6.25 mg total) by mouth 2 (two) times daily with a meal. 180 tablet 3  . cyclobenzaprine (FLEXERIL) 10 MG tablet Take 10 mg by mouth at bedtime.   6  . escitalopram (LEXAPRO) 10 MG tablet TAKE 1 TABLET BY MOUTH TWICE DAILY 180 tablet 0  . furosemide (LASIX) 80 MG tablet TAKE 1 TABLET BY MOUTH TWICE DAILY (Patient taking differently: TAKE 80 MG BY MOUTH TWICE DAILY AS NEEDED FOR FLUID GAIN) 180 tablet 0  . Lidocaine 0.5 % GEL Apply 1 application topically 3 (three) times daily.    Alexandria [name]  lisinopril (PRINIVIL,ZESTRIL) 5 MG tablet TAKE 1 TABLET BY MOUTH DAILY 30 tablet 5  . LORazepam (ATIVAN) 0.5 MG tablet Take 1 tablet (0.5 mg total) by mouth every 8 (eight) hours as needed. for anxiety 270 tablet 1  . lubiprostone (AMITIZA) 24 MCG capsule Take 1 capsule (24 mcg total) by mouth 2 (two) times daily with a meal. 60 capsule 2  . metolazone (ZAROXOLYN) 2.5 MG tablet Take 1 tablet (2.5 mg total) by mouth daily as needed (fluid gain).    . montelukast (SINGULAIR) 5 MG chewable tablet Chew 1 tablet (5 mg total) by mouth at bedtime. 30 tablet 3  . oxyCODONE-acetaminophen (PERCOCET) 10-325 MG tablet Take 1 tablet by mouth every 6 (six) hours as needed for pain. 15 tablet 0  . potassium chloride SA (K-DUR,KLOR-CON) 20 MEQ tablet Take 1 tablet (20 mEq total) by mouth 3 (three) times daily. 270 tablet 1  . ranitidine (ZANTAC) 150 MG tablet Take 1 tablet (150 mg total) by mouth at bedtime. 30 tablet 3  . budesonide-formoterol (SYMBICORT) 80-4.5 MCG/ACT inhaler Inhale 2 puffs into the lungs 2 (two) times daily. (Patient not taking: Reported on 08/21/2016) 1 Inhaler 3  . docusate sodium 100 MG CAPS Take 100 mg by mouth 2 (two) times daily. (Patient not taking: Reported on 08/21/2016) 10 capsule 0  . oxymorphone (OPANA ER) 30 MG 12 hr tablet Take 1 tablet by mouth 2 (two) times daily.   0   No current facility-administered medications on file prior to visit.      Review of Systems  Constitutional:   No  weight loss, night sweats,  Fevers, chills,  +fatigue, or  lassitude.  HEENT:   No headaches,  Difficulty swallowing,  Tooth/dental problems, or  Sore throat,                No sneezing, itching, ear ache, nasal congestion, post nasal drip,   CV:  No chest pain,  Orthopnea, PND, +swelling in lower extremities, anasarca, dizziness, palpitations, syncope.   GI  No heartburn, indigestion, abdominal pain, nausea, vomiting, diarrhea, change in bowel habits, loss of appetite, bloody stools.   Resp:   No coughing up of blood.  No change in color of mucus.  No wheezing.  No chest wall deformity  Skin: no rash or lesions.  GU: no dysuria, change in color of urine, no urgency or frequency.  No flank pain, no hematuria   MS:  No joint pain or swelling.  No decreased range of motion.  No back pain.  Psych:  No  change in mood or affect. No depression or anxiety.  No memory loss.          Objective:   Physical Exam Vitals:   08/21/16 1223  BP: 140/88  Pulse: 87  Temp: 97.6 F (36.4 C)  TempSrc: Oral  SpO2: 96%  Weight: 236 lb 6.4 oz (107.2 kg)  Height: _0  (1.676 m)    GEN: A/Ox3; pleasant , NAD, morbidly obese, unsteady with cane   HEENT:  [place]/AT,  EACs-clear, TMs-wnl, NOSE-clear, THROAT-clear, no lesions, no postnasal drip or exudate noted.   NECK:  Supple w/ fair ROM; no JVD; normal carotid impulses w/o bruits; no thyromegaly or nodules palpated; no lymphadenopathy.    RESP  Diminished BS in bases  no accessory muscle use, no dullness to percussion  CARD:  RRR, no m/r/g  , tr + peripheral edema, pulses intact, no cyanosis or clubbing.Lymphedema on left   GI:   Soft , obese & nt; nml bowel sounds; no organomegaly or masses detected.   Musco: Warm bil, no deformities or joint swelling noted. + wooden shoe   Neuro: alert, no focal deficits noted.    Skin: Warm, no lesions or rashes     Jaydyn Bozzo NP-C  [facility]  08/21/2016

## 2016-08-22 DIAGNOSIS — S91301A Unspecified open wound, right foot, initial encounter: Secondary | ICD-10-CM | POA: Diagnosis not present

## 2016-08-25 ENCOUNTER — Other Ambulatory Visit: Payer: Self-pay | Admitting: Internal Medicine

## 2016-08-25 DIAGNOSIS — D509 Iron deficiency anemia, unspecified: Secondary | ICD-10-CM

## 2016-08-25 DIAGNOSIS — C50911 Malignant neoplasm of unspecified site of right female breast: Secondary | ICD-10-CM

## 2016-09-06 ENCOUNTER — Telehealth: Payer: Self-pay | Admitting: Internal Medicine

## 2016-09-06 ENCOUNTER — Ambulatory Visit: Payer: Medicare Other | Admitting: Podiatry

## 2016-09-06 DIAGNOSIS — Z79899 Other long term (current) drug therapy: Secondary | ICD-10-CM | POA: Diagnosis not present

## 2016-09-06 DIAGNOSIS — M4696 Unspecified inflammatory spondylopathy, lumbar region: Secondary | ICD-10-CM | POA: Diagnosis not present

## 2016-09-06 DIAGNOSIS — Z79891 Long term (current) use of opiate analgesic: Secondary | ICD-10-CM | POA: Diagnosis not present

## 2016-09-06 DIAGNOSIS — M47817 Spondylosis without myelopathy or radiculopathy, lumbosacral region: Secondary | ICD-10-CM | POA: Diagnosis not present

## 2016-09-06 DIAGNOSIS — G894 Chronic pain syndrome: Secondary | ICD-10-CM | POA: Diagnosis not present

## 2016-09-06 DIAGNOSIS — M5137 Other intervertebral disc degeneration, lumbosacral region: Secondary | ICD-10-CM | POA: Diagnosis not present

## 2016-09-06 NOTE — Telephone Encounter (Signed)
Patient Name: Alexandria [name]  DOB: 06/03/1951    Initial Comment vomited bright red blood    Nurse Assessment  Nurse: Julien Girt RN, Almyra Free Date/Time Eilene Ghazi Time): 09/06/2016 12:04:45 PM  Confirm and document reason for call. If symptomatic, describe symptoms. ---Caller states she has a large hiatal hernia and has been having severe pain for the last 2 weeks. States she needs to schedule the hernia repair and a gastric sleeve. She has vomited blood this morning and it is also in her mucus when she coughs. She is taking Percocet for the pain but it is not helping. Her pain is currently an "11".  Does the patient have any new or worsening symptoms? ---Yes  Will a triage be completed? ---Yes  Related visit to physician within the last 2 weeks? ---Yes  Does the PT have any chronic conditions? (i.e. diabetes, asthma, etc.) ---Yes  List chronic conditions. ---Hiatal Hernia, Anemia, Arthritis, Htn, High cholesterol, CHF  Is this a behavioral health or substance abuse call? ---No     Guidelines    Guideline Title Affirmed Question Affirmed Notes  Vomiting Blood [1] Vomited blood AND [2] more than a few streaks of blood (Exception: few streaks and only occurred once) (Exception: swallowed blood from a nosebleed or cut in the mouth)    Final Disposition User   Go to ED Now Julien Girt, RN, Almyra Free    Referrals  GO TO FACILITY UNDECIDED   Disagree/Comply: Leta Baptist

## 2016-09-06 NOTE — Telephone Encounter (Signed)
Spoke to the patient, stated that the bleeding had stopped but patient was Advised to go to the ED for evaluation, patient also stated that she was waiting for a call from her Surgeon to see if the bleeding was from a Hernia which is due for surgery.

## 2016-09-07 ENCOUNTER — Encounter: Payer: Self-pay | Admitting: Physician Assistant

## 2016-09-07 NOTE — Telephone Encounter (Signed)
LMTCB

## 2016-09-12 ENCOUNTER — Other Ambulatory Visit: Payer: Self-pay | Admitting: Cardiovascular Disease

## 2016-09-12 ENCOUNTER — Other Ambulatory Visit: Payer: Self-pay | Admitting: Emergency Medicine

## 2016-09-12 ENCOUNTER — Other Ambulatory Visit: Payer: Self-pay

## 2016-09-12 MED ORDER — ATORVASTATIN CALCIUM 20 MG PO TABS
20.0000 mg | ORAL_TABLET | Freq: Every day | ORAL | 1 refills | Status: DC
Start: 2016-09-12 — End: 2016-10-27

## 2016-09-12 MED ORDER — FUROSEMIDE 80 MG PO TABS: 80.0000 mg | ORAL_TABLET | Freq: Two times a day (BID) | ORAL | 3 refills | Status: DC

## 2016-09-12 MED ORDER — CARVEDILOL 6.25 MG PO TABS: 6.2500 mg | ORAL_TABLET | Freq: Two times a day (BID) | ORAL | 3 refills | Status: DC

## 2016-09-12 MED ORDER — LISINOPRIL 5 MG PO TABS: 5.0000 mg | ORAL_TABLET | Freq: Every day | ORAL | 5 refills | Status: DC

## 2016-09-12 MED ORDER — ANASTROZOLE 1 MG PO TABS: ORAL_TABLET | ORAL | 0 refills | Status: DC

## 2016-09-12 NOTE — Telephone Encounter (Signed)
Received faxed refill request for lasix and metolazone. Will refill for 1 month lasix only-lmtcb last visit with Dr Gwenlyn Found was 10-2014 needs follow up appt. Has she been taking Metolazone?

## 2016-09-13 ENCOUNTER — Other Ambulatory Visit: Payer: Self-pay | Admitting: Emergency Medicine

## 2016-09-13 ENCOUNTER — Other Ambulatory Visit: Payer: Self-pay

## 2016-09-14 ENCOUNTER — Ambulatory Visit (INDEPENDENT_AMBULATORY_CARE_PROVIDER_SITE_OTHER): Payer: Medicare Other | Admitting: Podiatry

## 2016-09-14 ENCOUNTER — Encounter: Payer: Self-pay | Admitting: Podiatry

## 2016-09-14 VITALS — Ht 66.0 in | Wt 236.0 lb

## 2016-09-14 DIAGNOSIS — M79676 Pain in unspecified toe(s): Secondary | ICD-10-CM | POA: Diagnosis not present

## 2016-09-14 DIAGNOSIS — M2010 Hallux valgus (acquired), unspecified foot: Secondary | ICD-10-CM

## 2016-09-14 DIAGNOSIS — B351 Tinea unguium: Secondary | ICD-10-CM | POA: Diagnosis not present

## 2016-09-14 DIAGNOSIS — M2040 Other hammer toe(s) (acquired), unspecified foot: Secondary | ICD-10-CM

## 2016-09-14 NOTE — Progress Notes (Signed)
Complaint:  Visit Type: Patient returns to my office for continued preventative foot care services. Complaint: Patient states" my nails have grown long and thick and become painful to walk and wear shoes". The patient presents for preventative foot care services. No changes to ROS  Podiatric Exam: Vascular: dorsalis pedis and posterior tibial pulses are palpable bilateral. Capillary return is immediate. Temperature gradient is WNL. Skin turgor WNL  Sensorium: Normal Semmes Weinstein monofilament test. Normal tactile sensation bilaterally. Nail Exam: Pt has thick disfigured discolored nails with subungual debris noted bilateral entire nail hallux through fifth toenails Ulcer Exam: There is no evidence of ulcer or pre-ulcerative changes or infection. Orthopedic Exam: Muscle tone and strength are WNL. No limitations in general ROM. No crepitus or effusions noted. Foot type and digits show no abnormalities. Hallux malleus.  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 Skin: No Porokeratosis. No infection or ulcers  Diagnosis:  Onychomycosis, , Pain in right toe, pain in left toes  Treatment & Plan Procedures and Treatment: Consent by patient was obtained for treatment procedures. The patient understood the discussion of treatment and procedures well. All questions were answered thoroughly reviewed. Debridement of mycotic and hypertrophic toenails, 1 through 5 bilateral and clearing of subungual debris. No ulceration, no infection noted.  Return Visit-Office Procedure: Patient instructed to return to the office for a follow up visit 10 weeks  for continued evaluation and treatment.    Gardiner Barefoot DPM

## 2016-09-21 DIAGNOSIS — K449 Diaphragmatic hernia without obstruction or gangrene: Secondary | ICD-10-CM | POA: Diagnosis not present

## 2016-09-22 ENCOUNTER — Ambulatory Visit: Payer: Self-pay | Admitting: Physician Assistant

## 2016-10-02 DIAGNOSIS — Z79891 Long term (current) use of opiate analgesic: Secondary | ICD-10-CM | POA: Diagnosis not present

## 2016-10-02 DIAGNOSIS — M47816 Spondylosis without myelopathy or radiculopathy, lumbar region: Secondary | ICD-10-CM | POA: Diagnosis not present

## 2016-10-02 DIAGNOSIS — M5136 Other intervertebral disc degeneration, lumbar region: Secondary | ICD-10-CM | POA: Diagnosis not present

## 2016-10-02 DIAGNOSIS — Z79899 Other long term (current) drug therapy: Secondary | ICD-10-CM | POA: Diagnosis not present

## 2016-10-02 DIAGNOSIS — M545 Low back pain: Secondary | ICD-10-CM | POA: Diagnosis not present

## 2016-10-02 DIAGNOSIS — G894 Chronic pain syndrome: Secondary | ICD-10-CM | POA: Diagnosis not present

## 2016-10-17 NOTE — Progress Notes (Signed)
Scheduling pre op- please PLACE SURGICAL ORDERS IN EPIC  thanks

## 2016-10-27 ENCOUNTER — Ambulatory Visit (INDEPENDENT_AMBULATORY_CARE_PROVIDER_SITE_OTHER): Payer: Medicare Other | Admitting: Internal Medicine

## 2016-10-27 ENCOUNTER — Ambulatory Visit: Payer: Self-pay | Admitting: Surgery

## 2016-10-27 ENCOUNTER — Encounter: Payer: Self-pay | Admitting: Internal Medicine

## 2016-10-27 VITALS — BP 148/88 | HR 100 | Temp 98.4°F | Ht 66.0 in | Wt 243.4 lb

## 2016-10-27 DIAGNOSIS — I5033 Acute on chronic diastolic (congestive) heart failure: Secondary | ICD-10-CM

## 2016-10-27 DIAGNOSIS — D500 Iron deficiency anemia secondary to blood loss (chronic): Secondary | ICD-10-CM | POA: Diagnosis not present

## 2016-10-27 DIAGNOSIS — E876 Hypokalemia: Secondary | ICD-10-CM | POA: Diagnosis not present

## 2016-10-27 DIAGNOSIS — R600 Localized edema: Secondary | ICD-10-CM

## 2016-10-27 DIAGNOSIS — K449 Diaphragmatic hernia without obstruction or gangrene: Secondary | ICD-10-CM | POA: Diagnosis not present

## 2016-10-27 DIAGNOSIS — I10 Essential (primary) hypertension: Secondary | ICD-10-CM | POA: Diagnosis not present

## 2016-10-27 LAB — CBC WITH DIFFERENTIAL/PLATELET
BASOS PCT: 0.4 % (ref 0.0–3.0)
Basophils Absolute: 0 10*3/uL (ref 0.0–0.1)
EOS PCT: 0.9 % (ref 0.0–5.0)
Eosinophils Absolute: 0.1 10*3/uL (ref 0.0–0.7)
HCT: 43.8 % (ref 36.0–46.0)
Hemoglobin: 14.5 g/dL (ref 12.0–15.0)
LYMPHS ABS: 1 10*3/uL (ref 0.7–4.0)
Lymphocytes Relative: 15.8 % (ref 12.0–46.0)
MCHC: 33.2 g/dL (ref 30.0–36.0)
MCV: 87.6 fl (ref 78.0–100.0)
MONO ABS: 0.7 10*3/uL (ref 0.1–1.0)
MONOS PCT: 11.3 % (ref 3.0–12.0)
NEUTROS ABS: 4.6 10*3/uL (ref 1.4–7.7)
NEUTROS PCT: 71.6 % (ref 43.0–77.0)
PLATELETS: 187 10*3/uL (ref 150.0–400.0)
RBC: 5 Mil/uL (ref 3.87–5.11)
RDW: 18.8 % — AB (ref 11.5–15.5)
WBC: 6.5 10*3/uL (ref 4.0–10.5)

## 2016-10-27 LAB — COMPREHENSIVE METABOLIC PANEL
ALT: 73 U/L — ABNORMAL HIGH (ref 0–35)
AST: 91 U/L — ABNORMAL HIGH (ref 0–37)
Albumin: 3.9 g/dL (ref 3.5–5.2)
Alkaline Phosphatase: 234 U/L — ABNORMAL HIGH (ref 39–117)
BUN: 12 mg/dL (ref 6–23)
CHLORIDE: 102 meq/L (ref 96–112)
CO2: 25 meq/L (ref 19–32)
Calcium: 9.6 mg/dL (ref 8.4–10.5)
Creatinine, Ser: 0.73 mg/dL (ref 0.40–1.20)
GFR: 85.36 mL/min (ref >60.00)
GLUCOSE: 97 mg/dL (ref 70–99)
POTASSIUM: 4.2 meq/L (ref 3.5–5.1)
SODIUM: 137 meq/L (ref 135–145)
TOTAL PROTEIN: 7.3 g/dL (ref 6.0–8.3)
Total Bilirubin: 1.3 mg/dL — ABNORMAL HIGH (ref 0.2–1.2)

## 2016-10-27 LAB — FERRITIN: FERRITIN: 102.6 ng/mL (ref 10.0–291.0)

## 2016-10-27 LAB — TSH: TSH: 2.38 u[IU]/mL (ref 0.35–4.50)

## 2016-10-27 MED ORDER — ATORVASTATIN CALCIUM 20 MG PO TABS: 20.0000 mg | ORAL_TABLET | Freq: Every day | ORAL | 1 refills | Status: DC

## 2016-10-27 MED ORDER — CARVEDILOL 6.25 MG PO TABS: 6.2500 mg | ORAL_TABLET | Freq: Two times a day (BID) | ORAL | 3 refills | Status: DC

## 2016-10-27 MED ORDER — POTASSIUM CHLORIDE CRYS ER 20 MEQ PO TBCR: 20.0000 meq | EXTENDED_RELEASE_TABLET | Freq: Three times a day (TID) | ORAL | 1 refills | Status: DC

## 2016-10-27 MED ORDER — LISINOPRIL 5 MG PO TABS: 5.0000 mg | ORAL_TABLET | Freq: Every day | ORAL | 5 refills | Status: DC

## 2016-10-27 MED ORDER — FUROSEMIDE 80 MG PO TABS: 80.0000 mg | ORAL_TABLET | Freq: Two times a day (BID) | ORAL | 3 refills | Status: DC

## 2016-10-27 MED ORDER — ANASTROZOLE 1 MG PO TABS: ORAL_TABLET | ORAL | 1 refills | Status: DC

## 2016-10-27 NOTE — Patient Instructions (Signed)
Limit your sodium (Salt) intake  Please check your blood pressure on a regular basis.  If it is consistently greater than 150/90, please make an office appointment.  Return in 3 months for follow-up  Cardiology follow-up as scheduled

## 2016-10-27 NOTE — Progress Notes (Signed)
Pre visit review using our clinic review tool, if applicable. No additional management support is needed unless otherwise documented below in the visit note. 

## 2016-10-27 NOTE — Progress Notes (Signed)
Subjective:    Patient ID: Alexandria [name], female    DOB: 02-Dec-1950, 66 y.o.   MRN: [id number]  HPI  66 year old patient who is seen today for medical follow-up prior to elective surgery for a moderate sized hiatal hernia.  This is scheduled for 7 days She states that she is having worsening pain over the past 2 years.  Pain is worse at night and especially with bending.  She describes chest pain as a severe grabbing sensation that is 10+ on a scale of 1-10. Pain does not seem to be aggravated postprandially  She is quite inactive due to chronic low back pain and knee pain.  Denies any exertional shortness of breath or ischemic sounding chest pain She is on Percocet 4 times daily, which helps with back and arthritic pain, but does nothing for her chest pain that she attributes to her hiatal hernia. She had poorly function studies done in October 2016 that revealed a mild restrictive defect.  Only, probably secondary to obesity  Medical regimen includes Zantac.  She is followed by chronic pain management.  She does have a history of diastolic heart failure and is on diuretic therapy.  This includes furosemide 80 mg twice daily.  She also uses Zaroxolyn 4-5 times per month.  She has chronic lower extremity edema and uses compression hose  Past Medical History:  Diagnosis Date  . Anxiety   . Breast cancer ([place]) 10/02/12   Invasive Ductal Carcinoma of the Right Upper Outer Quadrant - ER (>90%), PR - Neg., Her2 Neu Negative, Ki-67 Unknown  . Chronic back pain    "all the way up and down"  . Chronic bronchitis ([place])    "get it q yr" (09/07/2014)  . Complication of anesthesia    "I come out really anxious; need Ativan to ease me out" (09/07/2014)  . Daily headache    "recently" (09/07/2014)  . DDD (degenerative disc disease), cervical   . DDD (degenerative disc disease), lumbosacral   . DDD (degenerative disc disease), thoracolumbar   . Diastolic dysfunction   . Diastolic heart failure  ([place])   . Fibromyalgia   . GAD (generalized anxiety disorder)   . H/O hiatal hernia   . History of blood transfusion "3-4"   "never can tell why; don't know where the blood was coming from; doesn't show up in stool or urine"  . Hx of pulmonary embolus    During c-Section  . Hx of radiation therapy 03/04/13- 04/17/13   right breast 50 Gy 25 fractions, right breast boost 10 Gy 5 fractions  . Hyperlipidemia   . Hypertension   . Iron deficiency anemia   . Lymphedema    bilateral lower extremity  . Obesity    Class 2  . On home oxygen therapy    "2L just at night" (09/07/2014)  . Peripheral neuropathic pain   . Pneumonia    "at least twice/yr" (09/07/2014)  . Recurrent major depression ([place]) 06/2014   Seen by Dr. Louretta Shorten  . S/P radiation therapy  03/04/2013-04/17/2013   1) Right breast / 50 Gy in 25 fractions/ 2) Right breast boost / 10 Gy in 5 fractions  . Status post chemotherapy    4 cycles of Taxotere and cytoxan     Social History   Social History  . Marital status: Married    Spouse name: N/A  . Number of children: N/A  . Years of education: N/A   Occupational History  . retired Therapist, sports  Social History Main Topics  . Smoking status: Never Smoker  . Smokeless tobacco: Never Used     Comment: Through father  . Alcohol use No  . Drug use: No  . Sexual activity: Not Currently   Other Topics Concern  . Not on file   Social History Narrative   Originally from [place], [place]. Previously lived in [place]. [place] to [facility] in 2014. Previously worked as an Warden/ranger. No known TB exposure. No pets currently. No bird exposure. Minimal mold exposure.     Past Surgical History:  Procedure Laterality Date  . ABDOMINAL HYSTERECTOMY    . APPENDECTOMY    . BREAST BIOPSY Right   . BREAST LUMPECTOMY Right   . CESAREAN SECTION     [phone]  . DILATION AND CURETTAGE OF UTERUS  "numerous"  . FOOT SURGERY Right ~ 2013 X 3   "put pins in but pins kept breaking"  . INGUINAL HERNIA  REPAIR Bilateral    "cancer"  . PARTIAL COLECTOMY N/A 07/04/2014   Procedure: REPAIR OF INCARCERATED INCISIONAL HERNIA WITH MESH;  Surgeon: Excell Seltzer, [place];  Location: [facility];  Service: General;  Laterality: N/A;  . TONSILLECTOMY    . UMBILICAL HERNIA REPAIR  X 2    Family History  Problem Relation Age of Onset  . Parkinson's disease Mother   . Emphysema Maternal Grandfather   . COPD Maternal Grandfather   . COPD Sister   . Heart attack Sister   . COPD Maternal Grandmother   . CAD Neg Hx     Allergies  Allergen Reactions  . Contrast Media [Iodinated Diagnostic Agents] Anaphylaxis  . Solu-Medrol [Methylprednisolone Acetate] Other (See Comments)    Pt. States she is not allergic, just can't tolerate medication well.   . Albuterol Other (See Comments)    Anxious   . Prednisone Other (See Comments)    Pt gets very agitated when she takes high doses of steroids    Current Outpatient Prescriptions on File Prior to Visit  Medication Sig Dispense Refill  . albuterol (PROVENTIL HFA;VENTOLIN HFA) 108 (90 Base) MCG/ACT inhaler Inhale 2 puffs into the lungs every 6 (six) hours as needed for wheezing or shortness of breath. 1 Inhaler 2  . Biotin w/ Vitamins C & E (HAIR/SKIN/NAILS PO) Take 1 tablet by mouth 3 (three) times daily.    . cyclobenzaprine (FLEXERIL) 10 MG tablet Take 10 mg by mouth at bedtime.   6  . diphenhydrAMINE (BENADRYL) 25 MG tablet Take 50 mg by mouth at bedtime.    . docusate sodium 100 MG CAPS Take 100 mg by mouth 2 (two) times daily. (Patient taking differently: Take 100 mg by mouth 2 (two) times daily as needed (constipation). ) 10 capsule 0  . EMBEDA 50-2 MG CPCR Take 1 capsule by mouth daily.  0  . escitalopram (LEXAPRO) 10 MG tablet TAKE 1 TABLET BY MOUTH TWICE DAILY 180 tablet 1  . lidocaine (LIDODERM) 5 % Place 1 patch onto the skin every 12 (twelve) hours. Remove & Discard patch within 12 hours or as directed by MD    . LORazepam (ATIVAN) 0.5 MG tablet  Take 1 tablet (0.5 mg total) by mouth every 8 (eight) hours as needed. for anxiety (Patient taking differently: Take 0.5 mg by mouth 3 (three) times daily. ) 270 tablet 1  . lubiprostone (AMITIZA) 24 MCG capsule Take 1 capsule (24 mcg total) by mouth 2 (two) times daily with a meal. 60 capsule 2  . metolazone (  ZAROXOLYN) 2.5 MG tablet Take 1 tablet (2.5 mg total) by mouth daily as needed (fluid gain).    . montelukast (SINGULAIR) 5 MG chewable tablet Chew 1 tablet (5 mg total) by mouth at bedtime. 30 tablet 3  . Multiple Vitamin (MULTIVITAMIN WITH MINERALS) TABS tablet Take 1 tablet by mouth daily.    Marland Kitchen oxyCODONE-acetaminophen (PERCOCET) 10-325 MG tablet Take 1 tablet by mouth every 6 (six) hours as needed for pain. (Patient taking differently: Take 1 tablet by mouth 4 (four) times daily. ) 15 tablet 0  . ranitidine (ZANTAC) 150 MG tablet Take 1 tablet (150 mg total) by mouth at bedtime. (Patient taking differently: Take 150 mg by mouth 2 (two) times daily. ) 30 tablet 3   No current facility-administered medications on file prior to visit.     BP (!) 148/88 (BP Location: Left Arm, Patient Position: Sitting, Cuff Size: Normal)   Pulse 100   Temp 98.4 F (36.9 C) (Oral)   Ht _0  (1.676 m)   Wt 243 lb 6.4 oz (110.4 kg)   LMP 01/30/2005   SpO2 97%   BMI 39.29 kg/m     Review of Systems  Constitutional: Negative.   HENT: Negative for congestion, dental problem, hearing loss, rhinorrhea, sinus pressure, sore throat and tinnitus.   Eyes: Negative for pain, discharge and visual disturbance.  Respiratory: Negative for cough and shortness of breath.   Cardiovascular: Positive for chest pain and leg swelling. Negative for palpitations.  Gastrointestinal: Negative for abdominal distention, abdominal pain, blood in stool, constipation, diarrhea, nausea and vomiting.  Genitourinary: Negative for difficulty urinating, dysuria, flank pain, frequency, hematuria, pelvic pain, urgency, vaginal  bleeding, vaginal discharge and vaginal pain.  Musculoskeletal: Positive for arthralgias, back pain and gait problem. Negative for joint swelling.  Skin: Negative for rash.  Neurological: Negative for dizziness, syncope, speech difficulty, weakness, numbness and headaches.  Hematological: Negative for adenopathy.  Psychiatric/Behavioral: Negative for agitation, behavioral problems and dysphoric mood. The patient is not nervous/anxious.        Objective:   Physical Exam  Constitutional: She is oriented to person, place, and time. She appears well-developed and well-nourished.   Blood pressure 130/80 Weight 243  O2 sat ration 97%  Cannot transfer to the examining table unaided due to obesity and arthritic pain  HENT:  Head: Normocephalic.  Right Ear: External ear normal.  Left Ear: External ear normal.  Mouth/Throat: Oropharynx is clear and moist.  Eyes: Conjunctivae and EOM are normal. Pupils are equal, round, and reactive to light.  Neck: Normal range of motion. Neck supple. No thyromegaly present.  Cardiovascular: Normal rate, regular rhythm, normal heart sounds and intact distal pulses.   Pulmonary/Chest: Effort normal and breath sounds normal. No respiratory distress. She has no wheezes.  Abdominal: Soft. Bowel sounds are normal. She exhibits no mass. There is no tenderness.  Musculoskeletal: Normal range of motion. She exhibits edema.  Lymphadenopathy:    She has no cervical adenopathy.  Neurological: She is alert and oriented to person, place, and time.  Skin: Skin is warm and dry. No rash noted.  Psychiatric: She has a normal mood and affect. Her behavior is normal.          Assessment & Plan:   Diastolic heart failure.  Compensated Chronic lower extremity edema Essential hypertension, stable Moderate size.  hiatal hernia Atypical chest pain History of iron deficiency anemia.  Possibly secondary to hilar hernia.  Will check CBC and anemia indices Exogenous  obesity Chronic  low back pain Chronic narcotic use  We'll check CBC and electrolytes  Nyoka Cowden

## 2016-10-28 LAB — IRON AND TIBC
%SAT: 11 % (ref 11–50)
IRON: 37 ug/dL — AB (ref 45–160)
TIBC: 335 ug/dL (ref 250–450)
UIBC: 298 ug/dL (ref 125–400)

## 2016-10-30 ENCOUNTER — Other Ambulatory Visit: Payer: Self-pay

## 2016-10-30 ENCOUNTER — Ambulatory Visit: Payer: Self-pay | Admitting: Hematology & Oncology

## 2016-10-30 DIAGNOSIS — M25569 Pain in unspecified knee: Secondary | ICD-10-CM | POA: Diagnosis not present

## 2016-10-30 DIAGNOSIS — Z79899 Other long term (current) drug therapy: Secondary | ICD-10-CM | POA: Diagnosis not present

## 2016-10-30 DIAGNOSIS — Z79891 Long term (current) use of opiate analgesic: Secondary | ICD-10-CM | POA: Diagnosis not present

## 2016-10-30 DIAGNOSIS — G894 Chronic pain syndrome: Secondary | ICD-10-CM | POA: Diagnosis not present

## 2016-10-30 DIAGNOSIS — M47816 Spondylosis without myelopathy or radiculopathy, lumbar region: Secondary | ICD-10-CM | POA: Diagnosis not present

## 2016-10-30 DIAGNOSIS — M5136 Other intervertebral disc degeneration, lumbar region: Secondary | ICD-10-CM | POA: Diagnosis not present

## 2016-10-31 ENCOUNTER — Encounter: Payer: Self-pay | Admitting: *Deleted

## 2016-10-31 ENCOUNTER — Other Ambulatory Visit: Payer: Self-pay | Admitting: Internal Medicine

## 2016-10-31 DIAGNOSIS — L821 Other seborrheic keratosis: Secondary | ICD-10-CM | POA: Diagnosis not present

## 2016-10-31 DIAGNOSIS — L814 Other melanin hyperpigmentation: Secondary | ICD-10-CM | POA: Diagnosis not present

## 2016-10-31 DIAGNOSIS — Z85828 Personal history of other malignant neoplasm of skin: Secondary | ICD-10-CM | POA: Diagnosis not present

## 2016-10-31 DIAGNOSIS — I872 Venous insufficiency (chronic) (peripheral): Secondary | ICD-10-CM | POA: Diagnosis not present

## 2016-10-31 DIAGNOSIS — I8312 Varicose veins of left lower extremity with inflammation: Secondary | ICD-10-CM | POA: Diagnosis not present

## 2016-10-31 MED ORDER — POTASSIUM CHLORIDE CRYS ER 20 MEQ PO TBCR: 20.0000 meq | EXTENDED_RELEASE_TABLET | Freq: Three times a day (TID) | ORAL | 1 refills | Status: DC

## 2016-10-31 NOTE — Patient Instructions (Addendum)
Alexandria [name]  10/31/2016   Your procedure is scheduled on:11/03/16   Report to [place]  Entrance take [facility]  elevators to 3rd floor to  [place] at    [place] AM.  Call this number if you have problems the morning of surgery [phone]   Remember: ONLY 1 PERSON MAY GO WITH YOU TO SHORT STAY TO GET  READY MORNING OF [name].  Do not eat food or drink liquids :After Midnight.     Take these medicines the morning of surgery with A SIP OF WATER: ATIVAN IF NEEDED,LEXAPRO, KEPPRA, COREG, ALBUTEROL IF NEEDED(BRING), ARIMIDEX                                You may not have any metal on your body including hair pins and              piercings  Do not wear jewelry, make-up, lotions, powders or perfumes, deodorant             Do not wear nail polish.  Do not shave  48 hours prior to surgery.               Do not bring valuables to the hospital. [place].  Contacts, dentures or bridgework may not be worn into surgery.  Leave suitcase in the car. After surgery it may be brought to your room.                  Please read over the following fact sheets you were given: _____________________________________________________________________             [facility] - Preparing for Surgery Before surgery, you can play an important role.  Because skin is not sterile, your skin needs to be as free of germs as possible.  You can reduce the number of germs on your skin by washing with CHG (chlorahexidine gluconate) soap before surgery.  CHG is an antiseptic cleaner which kills germs and bonds with the skin to continue killing germs even after washing. Please DO NOT use if you have an allergy to CHG or antibacterial soaps.  If your skin becomes reddened/irritated stop using the CHG and inform your nurse when you arrive at Short Stay. Do not shave (including legs and underarms) for at least 48 hours prior to the  first CHG shower.  You may shave your face/neck. Please follow these instructions carefully:  1.  Shower with CHG Soap the night before surgery and the  morning of Surgery.  2.  If you choose to wash your hair, wash your hair first as usual with your  normal  shampoo.  3.  After you shampoo, rinse your hair and body thoroughly to remove the  shampoo.                           4.  Use CHG as you would any other liquid soap.  You can apply chg directly  to the skin and wash                       Gently with a scrungie or clean washcloth.  5.  Apply the CHG Soap to your body ONLY FROM THE NECK DOWN.   Do not use on face/ open                           Wound or open sores. Avoid contact with eyes, ears mouth and genitals (private parts).                       Wash face,  Genitals (private parts) with your normal soap.             6.  Wash thoroughly, paying special attention to the area where your surgery  will be performed.  7.  Thoroughly rinse your body with warm water from the neck down.  8.  DO NOT shower/wash with your normal soap after using and rinsing off  the CHG Soap.                9.  Pat yourself dry with a clean towel.            10.  Wear clean pajamas.            11.  Place clean sheets on your bed the night of your first shower and do not  sleep with pets. Day of Surgery : Do not apply any lotions/deodorants the morning of surgery.  Please wear clean clothes to the hospital/surgery center.  FAILURE TO FOLLOW THESE INSTRUCTIONS MAY RESULT IN THE CANCELLATION OF YOUR SURGERY PATIENT SIGNATURE_________________________________  NURSE SIGNATURE__________________________________  ________________________________________________________________________

## 2016-11-01 ENCOUNTER — Encounter (HOSPITAL_COMMUNITY): Payer: Self-pay

## 2016-11-01 ENCOUNTER — Encounter (HOSPITAL_COMMUNITY)
Admission: RE | Admit: 2016-11-01 | Discharge: 2016-11-01 | Disposition: A | Discharge status: Home / Self Care | Payer: Medicare Other | Source: Ambulatory Visit | Attending: Surgery | Admitting: Surgery

## 2016-11-01 DIAGNOSIS — C787 Secondary malignant neoplasm of liver and intrahepatic bile duct: Secondary | ICD-10-CM | POA: Diagnosis not present

## 2016-11-01 DIAGNOSIS — K219 Gastro-esophageal reflux disease without esophagitis: Secondary | ICD-10-CM | POA: Diagnosis not present

## 2016-11-01 DIAGNOSIS — J449 Chronic obstructive pulmonary disease, unspecified: Secondary | ICD-10-CM | POA: Diagnosis not present

## 2016-11-01 DIAGNOSIS — G894 Chronic pain syndrome: Secondary | ICD-10-CM | POA: Diagnosis not present

## 2016-11-01 DIAGNOSIS — Z923 Personal history of irradiation: Secondary | ICD-10-CM | POA: Diagnosis not present

## 2016-11-01 DIAGNOSIS — J45909 Unspecified asthma, uncomplicated: Secondary | ICD-10-CM | POA: Diagnosis not present

## 2016-11-01 DIAGNOSIS — E869 Volume depletion, unspecified: Secondary | ICD-10-CM | POA: Diagnosis not present

## 2016-11-01 DIAGNOSIS — K449 Diaphragmatic hernia without obstruction or gangrene: Secondary | ICD-10-CM | POA: Diagnosis not present

## 2016-11-01 DIAGNOSIS — Z01812 Encounter for preprocedural laboratory examination: Secondary | ICD-10-CM | POA: Insufficient documentation

## 2016-11-01 DIAGNOSIS — Z01810 Encounter for preprocedural cardiovascular examination: Secondary | ICD-10-CM

## 2016-11-01 DIAGNOSIS — R571 Hypovolemic shock: Secondary | ICD-10-CM | POA: Diagnosis not present

## 2016-11-01 DIAGNOSIS — I110 Hypertensive heart disease with heart failure: Secondary | ICD-10-CM | POA: Diagnosis not present

## 2016-11-01 DIAGNOSIS — M797 Fibromyalgia: Secondary | ICD-10-CM | POA: Diagnosis not present

## 2016-11-01 DIAGNOSIS — I5032 Chronic diastolic (congestive) heart failure: Secondary | ICD-10-CM | POA: Diagnosis not present

## 2016-11-01 DIAGNOSIS — R739 Hyperglycemia, unspecified: Secondary | ICD-10-CM | POA: Diagnosis not present

## 2016-11-01 DIAGNOSIS — Z9221 Personal history of antineoplastic chemotherapy: Secondary | ICD-10-CM | POA: Diagnosis not present

## 2016-11-01 DIAGNOSIS — Z86711 Personal history of pulmonary embolism: Secondary | ICD-10-CM | POA: Diagnosis not present

## 2016-11-01 DIAGNOSIS — Z91041 Radiographic dye allergy status: Secondary | ICD-10-CM | POA: Diagnosis not present

## 2016-11-01 DIAGNOSIS — N179 Acute kidney failure, unspecified: Secondary | ICD-10-CM | POA: Diagnosis not present

## 2016-11-01 DIAGNOSIS — E876 Hypokalemia: Secondary | ICD-10-CM | POA: Diagnosis not present

## 2016-11-01 DIAGNOSIS — J9601 Acute respiratory failure with hypoxia: Secondary | ICD-10-CM | POA: Diagnosis not present

## 2016-11-01 DIAGNOSIS — Z853 Personal history of malignant neoplasm of breast: Secondary | ICD-10-CM | POA: Diagnosis not present

## 2016-11-01 HISTORY — DX: Gastro-esophageal reflux disease without esophagitis: K21.9

## 2016-11-01 HISTORY — DX: Cardiac murmur, unspecified: R01.1

## 2016-11-01 HISTORY — DX: Other specified postprocedural states: Z98.890

## 2016-11-01 HISTORY — DX: Nausea with vomiting, unspecified: R11.2

## 2016-11-01 NOTE — Progress Notes (Signed)
CXR 10/28/15  Cbc 10/27/16 OV 29/18  Echo 2015  All in epic  Labs done 10/27/16 in chart  And dermatology visit in chart

## 2016-11-02 NOTE — Anesthesia Preprocedure Evaluation (Addendum)
Anesthesia Evaluation  Patient identified by MRN, date of birth, ID band Patient awake    Reviewed: Allergy & Precautions, H&P , NPO status , Patient's Chart, lab work & pertinent test results, reviewed documented beta blocker date and time   Airway Mallampati: II  TM Distance: >3 FB Neck ROM: full    Dental no notable dental hx.    Pulmonary neg pulmonary ROS,    Pulmonary exam normal breath sounds clear to auscultation       Cardiovascular hypertension, + DOE  Normal cardiovascular exam+ Valvular Problems/Murmurs  Rhythm:regular Rate:Normal  Left ventricle: E/e&'>15 suggestive of elevated LV filling   pressures. The cavity size was normal. Systolic function was   normal. The estimated ejection fraction was in the range of 55%   to 60%. Wall motion was normal; there were no regional wall   motion abnormalities. There was an increased relative   contribution of atrial contraction to ventricular filling.   Doppler parameters are consistent with abnormal left ventricular   relaxation (grade 1 diastolic dysfunction). - Tricuspid valve: There was trivial regurgitation   Neuro/Psych negative neurological ROS     GI/Hepatic negative GI ROS, Neg liver ROS,   Endo/Other  Morbid obesity  Renal/GU negative Renal ROS  negative genitourinary   Musculoskeletal negative musculoskeletal ROS (+)   Abdominal   Peds negative pediatric ROS (+)  Hematology negative hematology ROS (+)   Anesthesia Other Findings Precedex on emergence  Peripheral neuropathic pain    Obesity  Class 2  Diastolic heart failure  Anxiety    H/O hiatal hernia   Breast cancer ([place]) 10/02/12  Hypertension    Diastolic dysfunction   Pneumonia  "at least twice/yr"  On home oxygen therapy "2L just at night"  History of blood transfusion "      Reproductive/Obstetrics negative OB ROS                             Anesthesia  Physical  Anesthesia Plan  ASA: III and emergent  Anesthesia Plan: General   Post-op Pain Management:    Induction: Intravenous  Airway Management Planned: Oral ETT  Additional Equipment:   Intra-op Plan:   Post-operative Plan: Extubation in OR  Informed Consent: I have reviewed the patients History and Physical, chart, labs and discussed the procedure including the risks, benefits and alternatives for the proposed anesthesia with the patient or authorized representative who has indicated his/her understanding and acceptance.   Dental Advisory Given and Dental advisory given  Plan Discussed with: CRNA and Surgeon  Anesthesia Plan Comments: (  Discussed general anesthesia, including possible nausea, instrumentation of airway, sore throat,pulmonary aspiration, etc. I asked if the were any outstanding questions, or  concerns before we proceeded.)        Anesthesia Quick Evaluation

## 2016-11-02 NOTE — Progress Notes (Signed)
Final EKG 11/01/16 epic

## 2016-11-02 NOTE — H&P (Signed)
Alexandria [name] 09/21/2016 2:38 PM Location: [place] Patient #: [phone] DOB: [birth year] Married / Language: Cleophus [name] / Race: White Female   History of Present Illness Rodman Key B. Hassell Done MD; 09/21/2016 2:53 PM) Patient words: Since insurance will not approve sleeve gastectomy or roux en Y gastric bypass along with her hiatal hernia repair then we will have to perform hiatal hernia repair possibly with Nissen fundoplication and accept a higher failure rate because of her obesity.   She understands the limitations and the risks. I asked her to do a colon prep before her surgery. Her prior 3 c sections and her ventral hernia repair were all below her navel. I discussed repair of her hiatal hernia with the risks, benefits, and limitations. Hopefully, this is make her lung disease better.  The patient is a 66 year old female.   Allergies Alexandria Lorenzo, LPN; 075-GRM [id number] PM) Iodinated Contrast Media  SOLU-medrol *CORTICOSTEROIDS*  Albuterol *ANTIASTHMATIC AND BRONCHODILATOR AGENTS*  PredniSONE *CORTICOSTEROIDS*   Medication History Alexandria Lorenzo, LPN; 075-GRM [id number] PM) Carvedilol (6.25MG  Tablet, Oral) Active. Cyclobenzaprine HCl (10MG  Tablet, Oral) Active. Oxycodone-Acetaminophen (10-325MG  Tablet, Oral) Active. Docusate Sodium (100MG  Capsule, Oral) Active. Lidocaine (2.5% Gel, External) Active. Lisinopril (5MG  Tablet, Oral) Active. MetOLazone (2.5MG  Tablet, Oral) Active. Singulair (10MG  Tablet, Oral) Active. [facility] Tablet ER, Oral) Active. RaNITidine HCl (150MG  Capsule, Oral) Active. Anastrozole (1MG  Tablet, Oral daily) Active. Atorvastatin Calcium (20MG  Tablet, Oral daily) Active. Escitalopram Oxalate (20MG  Tablet, Oral daily) Active. Furosemide (80MG  Tablet, Oral daily) Active. LORazepam (0.5MG  Tablet, Oral daily) Active. Medications Reconciled  Vitals Alexandria Billings Dockery LPN; 075-GRM [id number] PM) 09/21/2016 2:38 PM Weight: 244.4 lb Height: 68in Body  Surface Area: 2.23 m Body Mass Index: 37.16 kg/m  Temp.: 97.68F(Oral)  Pulse: 120 (Regular)  BP: 118/82 (Sitting, Left Arm, Standard)       Physical Exam (Karrah Mangini B. Hassell Done MD; 09/21/2016 2:58 PM) General Note: Obese white female   Head and Neck Head-normocephalic, atraumatic with no lesions or palpable masses. Face -Note: Dental prosthesis in the front-presumed.  Neck Global Assessment - no abnormal movements, no bruit auscultated on the right, no bruit auscultated on the left. Trachea-midline.  Eye Note: normal appearing   Chest and Lung Exam Examination of related systems reveals-well-developed, well-nourished and in no acute distress; alert and oriented x 3. Auscultation Breath sounds - Normal.  Breast - Did not examine.  Cardiovascular Cardiovascular examination reveals -normal heart sounds, regular rate and rhythm with no murmurs.  Abdomen Note: no incisions in the upper abdome   Rectal - Did not examine.    Assessment & Plan Rodman Key B. Hassell Done MD; 09/21/2016 2:59 PM) MORBID OBESITY, UNSPECIFIED OBESITY TYPE (E66.01) Story: Do a colon cleansing on the two days before your hiatal hernia surgery HIATAL HERNIA (K44.9) Impression: Will arrange lap repair of hiatal hernia and possible Nissen fundoplication

## 2016-11-03 ENCOUNTER — Inpatient Hospital Stay (HOSPITAL_COMMUNITY): Payer: Medicare Other | Admitting: Anesthesiology

## 2016-11-03 ENCOUNTER — Encounter (HOSPITAL_COMMUNITY): Payer: Self-pay | Admitting: *Deleted

## 2016-11-03 ENCOUNTER — Inpatient Hospital Stay (HOSPITAL_COMMUNITY)
Admission: RE | Admit: 2016-11-03 | Discharge: 2016-11-11 | DRG: 326 | Disposition: A | Discharge status: Home / Self Care | Payer: Medicare Other | Source: Ambulatory Visit | Attending: Surgery | Admitting: Surgery

## 2016-11-03 ENCOUNTER — Encounter (HOSPITAL_COMMUNITY)
Admission: RE | Disposition: A | Discharge status: Home / Self Care | Payer: Self-pay | Source: Ambulatory Visit | Attending: Surgery

## 2016-11-03 DIAGNOSIS — R571 Hypovolemic shock: Secondary | ICD-10-CM | POA: Diagnosis not present

## 2016-11-03 DIAGNOSIS — N170 Acute kidney failure with tubular necrosis: Secondary | ICD-10-CM | POA: Diagnosis not present

## 2016-11-03 DIAGNOSIS — R0602 Shortness of breath: Secondary | ICD-10-CM | POA: Diagnosis not present

## 2016-11-03 DIAGNOSIS — E876 Hypokalemia: Secondary | ICD-10-CM | POA: Diagnosis not present

## 2016-11-03 DIAGNOSIS — K219 Gastro-esophageal reflux disease without esophagitis: Secondary | ICD-10-CM | POA: Diagnosis present

## 2016-11-03 DIAGNOSIS — Z9889 Other specified postprocedural states: Secondary | ICD-10-CM

## 2016-11-03 DIAGNOSIS — Z86711 Personal history of pulmonary embolism: Secondary | ICD-10-CM

## 2016-11-03 DIAGNOSIS — N179 Acute kidney failure, unspecified: Secondary | ICD-10-CM | POA: Diagnosis not present

## 2016-11-03 DIAGNOSIS — R739 Hyperglycemia, unspecified: Secondary | ICD-10-CM | POA: Diagnosis not present

## 2016-11-03 DIAGNOSIS — K449 Diaphragmatic hernia without obstruction or gangrene: Secondary | ICD-10-CM | POA: Diagnosis not present

## 2016-11-03 DIAGNOSIS — Z853 Personal history of malignant neoplasm of breast: Secondary | ICD-10-CM | POA: Diagnosis not present

## 2016-11-03 DIAGNOSIS — I10 Essential (primary) hypertension: Secondary | ICD-10-CM | POA: Diagnosis not present

## 2016-11-03 DIAGNOSIS — C50919 Malignant neoplasm of unspecified site of unspecified female breast: Secondary | ICD-10-CM

## 2016-11-03 DIAGNOSIS — I5032 Chronic diastolic (congestive) heart failure: Secondary | ICD-10-CM | POA: Diagnosis present

## 2016-11-03 DIAGNOSIS — I110 Hypertensive heart disease with heart failure: Secondary | ICD-10-CM | POA: Diagnosis present

## 2016-11-03 DIAGNOSIS — G894 Chronic pain syndrome: Secondary | ICD-10-CM | POA: Diagnosis present

## 2016-11-03 DIAGNOSIS — Z923 Personal history of irradiation: Secondary | ICD-10-CM

## 2016-11-03 DIAGNOSIS — E869 Volume depletion, unspecified: Secondary | ICD-10-CM | POA: Diagnosis not present

## 2016-11-03 DIAGNOSIS — M797 Fibromyalgia: Secondary | ICD-10-CM | POA: Diagnosis present

## 2016-11-03 DIAGNOSIS — I9589 Other hypotension: Secondary | ICD-10-CM | POA: Diagnosis not present

## 2016-11-03 DIAGNOSIS — R609 Edema, unspecified: Secondary | ICD-10-CM | POA: Diagnosis not present

## 2016-11-03 DIAGNOSIS — J449 Chronic obstructive pulmonary disease, unspecified: Secondary | ICD-10-CM | POA: Diagnosis present

## 2016-11-03 DIAGNOSIS — Z91041 Radiographic dye allergy status: Secondary | ICD-10-CM | POA: Diagnosis not present

## 2016-11-03 DIAGNOSIS — J9690 Respiratory failure, unspecified, unspecified whether with hypoxia or hypercapnia: Secondary | ICD-10-CM | POA: Diagnosis not present

## 2016-11-03 DIAGNOSIS — I509 Heart failure, unspecified: Secondary | ICD-10-CM | POA: Diagnosis not present

## 2016-11-03 DIAGNOSIS — Z9221 Personal history of antineoplastic chemotherapy: Secondary | ICD-10-CM

## 2016-11-03 DIAGNOSIS — C787 Secondary malignant neoplasm of liver and intrahepatic bile duct: Secondary | ICD-10-CM | POA: Diagnosis present

## 2016-11-03 DIAGNOSIS — F4323 Adjustment disorder with mixed anxiety and depressed mood: Secondary | ICD-10-CM | POA: Diagnosis present

## 2016-11-03 DIAGNOSIS — E785 Hyperlipidemia, unspecified: Secondary | ICD-10-CM | POA: Diagnosis not present

## 2016-11-03 DIAGNOSIS — C801 Malignant (primary) neoplasm, unspecified: Secondary | ICD-10-CM | POA: Diagnosis not present

## 2016-11-03 DIAGNOSIS — J9601 Acute respiratory failure with hypoxia: Secondary | ICD-10-CM

## 2016-11-03 DIAGNOSIS — Z6839 Body mass index (BMI) 39.0-39.9, adult: Secondary | ICD-10-CM | POA: Diagnosis not present

## 2016-11-03 DIAGNOSIS — F411 Generalized anxiety disorder: Secondary | ICD-10-CM | POA: Diagnosis present

## 2016-11-03 DIAGNOSIS — J45909 Unspecified asthma, uncomplicated: Secondary | ICD-10-CM | POA: Diagnosis present

## 2016-11-03 DIAGNOSIS — I5033 Acute on chronic diastolic (congestive) heart failure: Secondary | ICD-10-CM | POA: Diagnosis not present

## 2016-11-03 HISTORY — PX: HIATAL HERNIA REPAIR: SHX195

## 2016-11-03 LAB — CBC
HCT: 39.4 % (ref 36.0–46.0)
Hemoglobin: 12.9 g/dL (ref 12.0–15.0)
MCH: 28 pg (ref 26.0–34.0)
MCHC: 32.7 g/dL (ref 30.0–36.0)
MCV: 85.5 fL (ref 78.0–100.0)
PLATELETS: 166 10*3/uL (ref 150–400)
RBC: 4.61 MIL/uL (ref 3.87–5.11)
RDW: 16.4 % — AB (ref 11.5–15.5)
WBC: 9.3 10*3/uL (ref 4.0–10.5)

## 2016-11-03 LAB — CREATININE, SERUM
CREATININE: 1.23 mg/dL — AB (ref 0.44–1.00)
GFR calc Af Amer: 53 mL/min — ABNORMAL LOW (ref >60)
GFR calc non Af Amer: 46 mL/min — ABNORMAL LOW (ref >60)

## 2016-11-03 LAB — MRSA PCR SCREENING: MRSA by PCR: NEGATIVE

## 2016-11-03 SURGERY — REPAIR, HERNIA, HIATAL, LAPAROSCOPIC
Anesthesia: General

## 2016-11-03 MED ORDER — MIDAZOLAM HCL 2 MG/2ML IJ SOLN
INTRAMUSCULAR | Status: AC
  Filled 2016-11-03: qty 2

## 2016-11-03 MED ORDER — BUPIVACAINE LIPOSOME 1.3 % IJ SUSP
20.0000 mL | Freq: Once | INTRAMUSCULAR | Status: AC
  Administered 2016-11-03: 20 mL
  Filled 2016-11-03: qty 20

## 2016-11-03 MED ORDER — EPHEDRINE 5 MG/ML INJ
INTRAVENOUS | Status: AC
  Filled 2016-11-03: qty 10

## 2016-11-03 MED ORDER — ARTIFICIAL TEARS OP OINT
TOPICAL_OINTMENT | OPHTHALMIC | Status: AC
  Filled 2016-11-03: qty 3.5

## 2016-11-03 MED ORDER — ALBUTEROL SULFATE (2.5 MG/3ML) 0.083% IN NEBU: 2.5000 mg | INHALATION_SOLUTION | Freq: Four times a day (QID) | RESPIRATORY_TRACT | Status: DC | PRN

## 2016-11-03 MED ORDER — PROCHLORPERAZINE EDISYLATE 5 MG/ML IJ SOLN: 5.0000 mg | INTRAMUSCULAR | Status: DC | PRN

## 2016-11-03 MED ORDER — FUROSEMIDE 40 MG PO TABS
80.0000 mg | ORAL_TABLET | Freq: Two times a day (BID) | ORAL | Status: DC
  Administered 2016-11-03 – 2016-11-05 (×4): 80 mg via ORAL
  Filled 2016-11-03 (×5): qty 2

## 2016-11-03 MED ORDER — HEPARIN SODIUM (PORCINE) 5000 UNIT/ML IJ SOLN
5000.0000 [IU] | Freq: Once | INTRAMUSCULAR | Status: AC
  Administered 2016-11-03: 5000 [IU] via SUBCUTANEOUS
  Filled 2016-11-03: qty 1

## 2016-11-03 MED ORDER — ROCURONIUM BROMIDE 50 MG/5ML IV SOSY
PREFILLED_SYRINGE | INTRAVENOUS | Status: AC
  Filled 2016-11-03: qty 5

## 2016-11-03 MED ORDER — LACTATED RINGERS IV SOLN
Freq: Every day | INTRAVENOUS | Status: DC | PRN
Start: 2016-11-03 — End: 2016-11-05

## 2016-11-03 MED ORDER — CARVEDILOL 6.25 MG PO TABS
6.2500 mg | ORAL_TABLET | Freq: Two times a day (BID) | ORAL | Status: DC
  Administered 2016-11-04 – 2016-11-05 (×3): 6.25 mg via ORAL
  Filled 2016-11-03 (×4): qty 1

## 2016-11-03 MED ORDER — SUCCINYLCHOLINE CHLORIDE 200 MG/10ML IV SOSY
PREFILLED_SYRINGE | INTRAVENOUS | Status: AC
  Filled 2016-11-03: qty 10

## 2016-11-03 MED ORDER — LACTATED RINGERS IV SOLN
INTRAVENOUS | Status: DC | PRN
  Administered 2016-11-03 (×2): via INTRAVENOUS

## 2016-11-03 MED ORDER — LACTATED RINGERS IV SOLN: INTRAVENOUS | Status: DC

## 2016-11-03 MED ORDER — CEFOTETAN DISODIUM-DEXTROSE 2-2.08 GM-% IV SOLR
2.0000 g | INTRAVENOUS | Status: AC
  Administered 2016-11-03: 2 g via INTRAVENOUS

## 2016-11-03 MED ORDER — DEXAMETHASONE SODIUM PHOSPHATE 10 MG/ML IJ SOLN
INTRAMUSCULAR | Status: DC | PRN
  Administered 2016-11-03: 4 mg via INTRAVENOUS

## 2016-11-03 MED ORDER — BUPIVACAINE HCL (PF) 0.25 % IJ SOLN
INTRAMUSCULAR | Status: AC
  Filled 2016-11-03: qty 30

## 2016-11-03 MED ORDER — SUGAMMADEX SODIUM 200 MG/2ML IV SOLN
INTRAVENOUS | Status: DC | PRN
  Administered 2016-11-03: 200 mg via INTRAVENOUS

## 2016-11-03 MED ORDER — FENTANYL CITRATE (PF) 250 MCG/5ML IJ SOLN
INTRAMUSCULAR | Status: AC
  Filled 2016-11-03: qty 5

## 2016-11-03 MED ORDER — PROPOFOL 10 MG/ML IV BOLUS
INTRAVENOUS | Status: DC | PRN
  Administered 2016-11-03: 200 mg via INTRAVENOUS

## 2016-11-03 MED ORDER — DEXAMETHASONE SODIUM PHOSPHATE 10 MG/ML IJ SOLN
INTRAMUSCULAR | Status: AC
  Filled 2016-11-03: qty 1

## 2016-11-03 MED ORDER — PHENYLEPHRINE 40 MCG/ML (10ML) SYRINGE FOR IV PUSH (FOR BLOOD PRESSURE SUPPORT)
PREFILLED_SYRINGE | INTRAVENOUS | Status: DC | PRN
  Administered 2016-11-03 (×3): 80 ug via INTRAVENOUS

## 2016-11-03 MED ORDER — EPHEDRINE SULFATE-NACL 50-0.9 MG/10ML-% IV SOSY
PREFILLED_SYRINGE | INTRAVENOUS | Status: DC | PRN
  Administered 2016-11-03 (×2): 10 mg via INTRAVENOUS
  Administered 2016-11-03: 5 mg via INTRAVENOUS
  Administered 2016-11-03: 10 mg via INTRAVENOUS
  Administered 2016-11-03: 5 mg via INTRAVENOUS

## 2016-11-03 MED ORDER — CEFOTETAN DISODIUM-DEXTROSE 2-2.08 GM-% IV SOLR
INTRAVENOUS | Status: AC
  Filled 2016-11-03: qty 50

## 2016-11-03 MED ORDER — ACETAMINOPHEN 500 MG PO TABS
1000.0000 mg | ORAL_TABLET | ORAL | Status: AC
  Administered 2016-11-03: 1000 mg via ORAL
  Filled 2016-11-03: qty 2

## 2016-11-03 MED ORDER — ONDANSETRON HCL 4 MG/2ML IJ SOLN
INTRAMUSCULAR | Status: DC | PRN
  Administered 2016-11-03: 4 mg via INTRAVENOUS

## 2016-11-03 MED ORDER — DEXMEDETOMIDINE HCL 200 MCG/2ML IV SOLN
INTRAVENOUS | Status: DC | PRN
  Administered 2016-11-03 (×4): 16 ug via INTRAVENOUS

## 2016-11-03 MED ORDER — HEPARIN SODIUM (PORCINE) 5000 UNIT/ML IJ SOLN
5000.0000 [IU] | Freq: Three times a day (TID) | INTRAMUSCULAR | Status: DC
  Administered 2016-11-03 – 2016-11-11 (×23): 5000 [IU] via SUBCUTANEOUS
  Filled 2016-11-03 (×23): qty 1

## 2016-11-03 MED ORDER — ONDANSETRON HCL 4 MG/2ML IJ SOLN
4.0000 mg | Freq: Four times a day (QID) | INTRAMUSCULAR | Status: DC | PRN
  Administered 2016-11-03 – 2016-11-04 (×2): 4 mg via INTRAVENOUS
  Filled 2016-11-03 (×2): qty 2

## 2016-11-03 MED ORDER — GABAPENTIN 300 MG PO CAPS
300.0000 mg | ORAL_CAPSULE | ORAL | Status: AC
Start: 2016-11-03 — End: 2016-11-03
  Administered 2016-11-03: 300 mg via ORAL
  Filled 2016-11-03: qty 1

## 2016-11-03 MED ORDER — CARVEDILOL 6.25 MG PO TABS
6.2500 mg | ORAL_TABLET | Freq: Two times a day (BID) | ORAL | Status: DC
  Administered 2016-11-03: 6.25 mg via ORAL
  Filled 2016-11-03: qty 1

## 2016-11-03 MED ORDER — LACTATED RINGERS IR SOLN
Status: DC | PRN
  Administered 2016-11-03: 1000 mL

## 2016-11-03 MED ORDER — KCL IN DEXTROSE-NACL 20-5-0.45 MEQ/L-%-% IV SOLN
INTRAVENOUS | Status: DC
  Administered 2016-11-03: 14:00:00 via INTRAVENOUS
  Filled 2016-11-03: qty 1000

## 2016-11-03 MED ORDER — FENTANYL CITRATE (PF) 100 MCG/2ML IJ SOLN
INTRAMUSCULAR | Status: DC | PRN
  Administered 2016-11-03 (×3): 50 ug via INTRAVENOUS

## 2016-11-03 MED ORDER — ROCURONIUM BROMIDE 10 MG/ML (PF) SYRINGE
PREFILLED_SYRINGE | INTRAVENOUS | Status: DC | PRN
  Administered 2016-11-03: 20 mg via INTRAVENOUS
  Administered 2016-11-03: 10 mg via INTRAVENOUS
  Administered 2016-11-03: 50 mg via INTRAVENOUS

## 2016-11-03 MED ORDER — LISINOPRIL 2.5 MG PO TABS
5.0000 mg | ORAL_TABLET | Freq: Every day | ORAL | Status: DC
  Administered 2016-11-04 – 2016-11-05 (×2): 5 mg via ORAL
  Filled 2016-11-03 (×2): qty 2

## 2016-11-03 MED ORDER — PROPOFOL 10 MG/ML IV BOLUS
INTRAVENOUS | Status: AC
  Filled 2016-11-03: qty 20

## 2016-11-03 MED ORDER — CELECOXIB 200 MG PO CAPS
400.0000 mg | ORAL_CAPSULE | ORAL | Status: AC
  Administered 2016-11-03: 400 mg via ORAL
  Filled 2016-11-03: qty 2

## 2016-11-03 MED ORDER — MORPHINE SULFATE (PF) 4 MG/ML IV SOLN
1.0000 mg | INTRAVENOUS | Status: DC | PRN
  Administered 2016-11-03 – 2016-11-05 (×10): 1 mg via INTRAVENOUS
  Filled 2016-11-03 (×10): qty 1

## 2016-11-03 MED ORDER — ACETAMINOPHEN 325 MG PO TABS
325.0000 mg | ORAL_TABLET | Freq: Four times a day (QID) | ORAL | Status: DC | PRN
  Administered 2016-11-04: 325 mg via ORAL
  Filled 2016-11-03: qty 1

## 2016-11-03 MED ORDER — SUGAMMADEX SODIUM 200 MG/2ML IV SOLN
INTRAVENOUS | Status: AC
  Filled 2016-11-03: qty 2

## 2016-11-03 MED ORDER — 0.9 % SODIUM CHLORIDE (POUR BTL) OPTIME
TOPICAL | Status: DC | PRN
  Administered 2016-11-03: 1000 mL

## 2016-11-03 MED ORDER — LORAZEPAM 0.5 MG PO TABS
0.5000 mg | ORAL_TABLET | Freq: Three times a day (TID) | ORAL | Status: DC | PRN
  Administered 2016-11-04: 0.5 mg via ORAL
  Filled 2016-11-03: qty 1

## 2016-11-03 MED ORDER — DIPHENHYDRAMINE HCL 50 MG/ML IJ SOLN
12.5000 mg | Freq: Four times a day (QID) | INTRAMUSCULAR | Status: DC | PRN
  Administered 2016-11-11: 25 mg via INTRAVENOUS
  Filled 2016-11-03: qty 1

## 2016-11-03 MED ORDER — PHENYLEPHRINE 40 MCG/ML (10ML) SYRINGE FOR IV PUSH (FOR BLOOD PRESSURE SUPPORT)
PREFILLED_SYRINGE | INTRAVENOUS | Status: AC
  Filled 2016-11-03: qty 10

## 2016-11-03 MED ORDER — HYDRALAZINE HCL 20 MG/ML IJ SOLN: 10.0000 mg | INTRAMUSCULAR | Status: DC | PRN

## 2016-11-03 MED ORDER — MONTELUKAST SODIUM 5 MG PO CHEW
5.0000 mg | CHEWABLE_TABLET | Freq: Every day | ORAL | Status: DC
  Administered 2016-11-03 – 2016-11-10 (×7): 5 mg via ORAL
  Filled 2016-11-03 (×8): qty 1

## 2016-11-03 MED ORDER — DEXMEDETOMIDINE HCL IN NACL 200 MCG/50ML IV SOLN
INTRAVENOUS | Status: AC
  Filled 2016-11-03: qty 50

## 2016-11-03 MED ORDER — ACETAMINOPHEN 160 MG/5ML PO SOLN: 960.0000 mg | Freq: Once | ORAL | Status: DC

## 2016-11-03 MED ORDER — MIDAZOLAM HCL 5 MG/5ML IJ SOLN
INTRAMUSCULAR | Status: DC | PRN
  Administered 2016-11-03: 2 mg via INTRAVENOUS

## 2016-11-03 MED ORDER — ONDANSETRON HCL 4 MG/2ML IJ SOLN
INTRAMUSCULAR | Status: AC
  Filled 2016-11-03: qty 2

## 2016-11-03 MED ORDER — LIDOCAINE 2% (20 MG/ML) 5 ML SYRINGE
INTRAMUSCULAR | Status: DC | PRN
  Administered 2016-11-03: 100 mg via INTRAVENOUS

## 2016-11-03 MED ORDER — ONDANSETRON 4 MG PO TBDP
4.0000 mg | ORAL_TABLET | Freq: Four times a day (QID) | ORAL | Status: DC | PRN
  Administered 2016-11-04: 4 mg via ORAL
  Filled 2016-11-03: qty 1

## 2016-11-03 MED ORDER — FENTANYL CITRATE (PF) 100 MCG/2ML IJ SOLN: 25.0000 ug | INTRAMUSCULAR | Status: DC | PRN

## 2016-11-03 MED ORDER — LIDOCAINE 2% (20 MG/ML) 5 ML SYRINGE
INTRAMUSCULAR | Status: AC
  Filled 2016-11-03: qty 5

## 2016-11-03 SURGICAL SUPPLY — 48 items
APPLIER CLIP ROT 10 11.4 M/L (STAPLE)
BENZOIN TINCTURE PRP APPL 2/3 (GAUZE/BANDAGES/DRESSINGS) IMPLANT
CABLE HIGH FREQUENCY MONO STRZ (ELECTRODE) IMPLANT
CLIP APPLIE ROT 10 11.4 M/L (STAPLE) IMPLANT
COVER SURGICAL LIGHT HANDLE (MISCELLANEOUS) ×2 IMPLANT
DECANTER SPIKE VIAL GLASS SM (MISCELLANEOUS) ×2 IMPLANT
DERMABOND ADVANCED (GAUZE/BANDAGES/DRESSINGS) ×1
DERMABOND ADVANCED .7 DNX12 (GAUZE/BANDAGES/DRESSINGS) ×1 IMPLANT
DEVICE SUT QUICK LOAD TK 5 (STAPLE) ×14 IMPLANT
DEVICE SUT TI-KNOT TK 5X26 (MISCELLANEOUS) IMPLANT
DEVICE SUTURE ENDOST 10MM (ENDOMECHANICALS) ×2 IMPLANT
DISSECTOR BLUNT TIP ENDO 5MM (MISCELLANEOUS) ×2 IMPLANT
DRAIN PENROSE 18X1/2 LTX STRL (DRAIN) ×2 IMPLANT
ELECT PENCIL ROCKER SW 15FT (MISCELLANEOUS) IMPLANT
ELECT REM PT RETURN 9FT ADLT (ELECTROSURGICAL) ×2
ELECTRODE REM PT RTRN 9FT ADLT (ELECTROSURGICAL) ×1 IMPLANT
FELT TEFLON 4 X1 (Mesh General) IMPLANT
GLOVE BIOGEL M 8.0 STRL (GLOVE) ×2 IMPLANT
GOWN STRL REUS W/TWL XL LVL3 (GOWN DISPOSABLE) ×8 IMPLANT
GRASPER ENDO BABCOCK 10 (MISCELLANEOUS) ×1 IMPLANT
GRASPER ENDO BABCOCK 10MM (MISCELLANEOUS) ×1
KIT BASIN OR (CUSTOM PROCEDURE TRAY) ×2 IMPLANT
NEEDLE SPNL 22GX3.5 QUINCKE BK (NEEDLE) ×2 IMPLANT
PAD POSITIONING PINK XL (MISCELLANEOUS) IMPLANT
POSITIONER SURGICAL ARM (MISCELLANEOUS) IMPLANT
SCISSORS LAP 5X45 EPIX DISP (ENDOMECHANICALS) ×2 IMPLANT
SCRUB TECHNI CARE 4 OZ NO DYE (MISCELLANEOUS) ×4 IMPLANT
SET IRRIG TUBING LAPAROSCOPIC (IRRIGATION / IRRIGATOR) ×2 IMPLANT
SHEARS HARMONIC ACE PLUS 45CM (MISCELLANEOUS) ×2 IMPLANT
SLEEVE ADV FIXATION 5X100MM (TROCAR) IMPLANT
SOLUTION ANTI FOG 6CC (MISCELLANEOUS) ×2 IMPLANT
STAPLER VISISTAT 35W (STAPLE) ×2 IMPLANT
STRIP CLOSURE SKIN 1/2X4 (GAUZE/BANDAGES/DRESSINGS) IMPLANT
SUT MNCRL AB 4-0 PS2 18 (SUTURE) ×4 IMPLANT
SUT MON AB 2-0 CT1 36 (SUTURE) ×2 IMPLANT
SUT SURGIDAC NAB ES-9 0 48 120 (SUTURE) ×14 IMPLANT
SUT VIC AB 4-0 SH 18 (SUTURE) IMPLANT
TAPE CLOTH 4X10 WHT NS (GAUZE/BANDAGES/DRESSINGS) IMPLANT
TIP INNERVISION DETACH 40FR (MISCELLANEOUS) IMPLANT
TIP INNERVISION DETACH 56FR (MISCELLANEOUS) ×2 IMPLANT
TIPS INNERVISION DETACH 40FR (MISCELLANEOUS)
TOWEL OR 17X26 10 PK STRL BLUE (TOWEL DISPOSABLE) ×2 IMPLANT
TRAY FOLEY CATH SILVER 14FR (SET/KITS/TRAYS/PACK) ×2 IMPLANT
TRAY LAPAROSCOPIC (CUSTOM PROCEDURE TRAY) ×2 IMPLANT
TROCAR ADV FIXATION 11X100MM (TROCAR) IMPLANT
TROCAR ADV FIXATION 5X100MM (TROCAR) IMPLANT
TROCAR BLADELESS OPT 5 100 (ENDOMECHANICALS) ×2 IMPLANT
TUBING INSUF HEATED (TUBING) ×2 IMPLANT

## 2016-11-03 NOTE — Anesthesia Postprocedure Evaluation (Signed)
Anesthesia Post Note  Patient: Alexandria [name]  Procedure(s) Performed: Procedure(s) (LRB): LAPAROSCOPIC NISSEN AND  REPAIR OF HIATAL HERNIA, (N/A)  Anesthesia Type: General       Last Vitals:  Vitals:   11/03/16 1500 11/03/16 1600  BP: 105/63   Pulse: 65   Resp: 11   Temp:  36.3 C    Last Pain:  Vitals:   11/03/16 1600  TempSrc: Oral  PainSc:                  Riccardo Dubin

## 2016-11-03 NOTE — Op Note (Signed)
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  After anesthesia was administered and the patient was prepped a timeout was performed.  Access achieved through the left upper quadrant with a 5 mm Optiview.  Second 5 mm placed on the left and adhesiolysis performed without difficulty.  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    Foregut dissection performed with the Harmonic.  Sac reduced and excess sac withdrawn.  Penrose placed to facilitate dissection.    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  EGD to verify EG junction.  Posterior hiatus repair with 4 sutures of Surgidek.  Three with Ty knot and the second from post was tied with the [place].  #56 lighted bougie passed and Nissen wrap with mobile stomach brought around and secured with three sutures secured with Ty Knots.  All three got purchases of esophagus.  Bougie withdrawn.  Ports injected with Exparel.  Skin closure with 4-0 monocryl and Dermabond.    The patient tolerated the procedure well and was taken to the PACU in stable condition.     Matt B. Hassell Done, [place], [facility], [place]

## 2016-11-03 NOTE — Anesthesia Procedure Notes (Signed)
Procedure Name: Intubation Date/Time: 11/03/2016 7:49 AM Performed by: Carleene Cooper A Pre-anesthesia Checklist: Emergency Drugs available, Patient identified, Suction available, Patient being monitored and Timeout performed Patient Re-evaluated:Patient Re-evaluated prior to inductionOxygen Delivery Method: Circle system utilized Preoxygenation: Pre-oxygenation with 100% oxygen Intubation Type: IV induction Ventilation: Mask ventilation without difficulty Laryngoscope Size: Mac and 4 Grade View: Grade I Tube type: Oral Tube size: 7.5 mm Number of attempts: 1 Airway Equipment and Method: Stylet Placement Confirmation: ETT inserted through vocal cords under direct vision,  positive ETCO2 and breath sounds checked- equal and bilateral Secured at: 22 cm Tube secured with: Tape Dental Injury: Teeth and Oropharynx as per pre-operative assessment

## 2016-11-03 NOTE — Progress Notes (Signed)
Dr. Hassell Done made aware of patient's urine output in PACU- 30 cc

## 2016-11-03 NOTE — Transfer of Care (Signed)
Immediate Anesthesia Transfer of Care Note  Patient: Alexandria [name]  Procedure(s) Performed: Procedure(s): LAPAROSCOPIC NISSEN AND  REPAIR OF HIATAL HERNIA, (N/A)  Patient Location: PACU  Anesthesia Type:General  Level of Consciousness: awake, alert , oriented and patient cooperative  Airway & Oxygen Therapy: Patient Spontanous Breathing and Patient connected to face mask oxygen  Post-op Assessment: Report given to RN, Post -op Vital signs reviewed and stable and Patient moving all extremities  Post vital signs: Reviewed and stable  Last Vitals:  Vitals:   11/03/16 0609  BP: 116/85  Pulse: (!) 112  Resp: 18  Temp: 36.7 C    Last Pain:  Vitals:   11/03/16 0609  TempSrc: Oral  PainSc: 5          Complications: No apparent anesthesia complications

## 2016-11-03 NOTE — Progress Notes (Signed)
Dr. Hassell Done in and talked with patient.

## 2016-11-03 NOTE — Care Management Note (Signed)
Case Management Note  Patient Details  Name: Alexandria [name] MRN: [id number] Date of Birth: 1951/09/04  Subjective/Objective:       Hypotensive s/p surgical procedure             Action/Plan: home  Expected Discharge Date:                  Expected Discharge Plan:  Home/Self Care  In-House Referral:     Discharge planning Services     Post Acute Care Choice:    Choice offered to:     DME Arranged:    DME Agency:     HH Arranged:    HH Agency:     Status of Service:  In process, will continue to follow  If discussed at Long Length of Stay Meetings, dates discussed:    Additional Comments:  Leeroy Cha, RN 11/03/2016, 3:11 PM

## 2016-11-03 NOTE — Interval H&P Note (Signed)
History and Physical Interval Note:  11/03/2016 7:30 AM  Alexandria [name]  has presented today for surgery, with the diagnosis of hiatal hernia  The various methods of treatment have been discussed with the patient and family. After consideration of risks, benefits and other options for treatment, the patient has consented to  Procedure(s): [place] (N/A) as a surgical intervention .  The patient's history has been reviewed, patient examined, no change in status, stable for surgery.  I have reviewed the patient's chart and labs.  Questions were answered to the patient's satisfaction.     Angee Gupton B

## 2016-11-04 ENCOUNTER — Inpatient Hospital Stay (HOSPITAL_COMMUNITY): Payer: Medicare Other

## 2016-11-04 LAB — COMPREHENSIVE METABOLIC PANEL
ALK PHOS: 182 U/L — AB (ref 38–126)
ALT: 241 U/L — AB (ref 14–54)
ANION GAP: 7 (ref 5–15)
AST: 333 U/L — ABNORMAL HIGH (ref 15–41)
Albumin: 3 g/dL — ABNORMAL LOW (ref 3.5–5.0)
BUN: 22 mg/dL — ABNORMAL HIGH (ref 6–20)
CALCIUM: 9 mg/dL (ref 8.9–10.3)
CO2: 27 mmol/L (ref 22–32)
CREATININE: 0.87 mg/dL (ref 0.44–1.00)
Chloride: 102 mmol/L (ref 101–111)
Glucose, Bld: 117 mg/dL — ABNORMAL HIGH (ref 65–99)
Potassium: 3.4 mmol/L — ABNORMAL LOW (ref 3.5–5.1)
SODIUM: 136 mmol/L (ref 135–145)
Total Bilirubin: 1.4 mg/dL — ABNORMAL HIGH (ref 0.3–1.2)
Total Protein: 6.7 g/dL (ref 6.5–8.1)

## 2016-11-04 LAB — CBC
HCT: 38.2 % (ref 36.0–46.0)
HEMOGLOBIN: 12.7 g/dL (ref 12.0–15.0)
MCH: 28.3 pg (ref 26.0–34.0)
MCHC: 33.2 g/dL (ref 30.0–36.0)
MCV: 85.3 fL (ref 78.0–100.0)
PLATELETS: 148 10*3/uL — AB (ref 150–400)
RBC: 4.48 MIL/uL (ref 3.87–5.11)
RDW: 16.5 % — ABNORMAL HIGH (ref 11.5–15.5)
WBC: 8.8 10*3/uL (ref 4.0–10.5)

## 2016-11-04 LAB — PROTIME-INR
INR: 1.03
PROTHROMBIN TIME: 13.6 s (ref 11.4–15.2)

## 2016-11-04 MED ORDER — LIP MEDEX EX OINT
TOPICAL_OINTMENT | CUTANEOUS | Status: AC
  Administered 2016-11-04: 08:00:00
  Filled 2016-11-04: qty 7

## 2016-11-04 MED ORDER — IOPAMIDOL (ISOVUE-300) INJECTION 61%
INTRAVENOUS | Status: AC
  Filled 2016-11-04: qty 50

## 2016-11-04 MED ORDER — IOPAMIDOL (ISOVUE-300) INJECTION 61%: 50.0000 mL | Freq: Once | INTRAVENOUS | Status: DC | PRN

## 2016-11-04 MED ORDER — SODIUM CHLORIDE 0.9 % IV SOLN
30.0000 meq | Freq: Once | INTRAVENOUS | Status: AC
  Administered 2016-11-04: 30 meq via INTRAVENOUS
  Filled 2016-11-04: qty 15

## 2016-11-04 MED ORDER — OXYCODONE HCL 5 MG/5ML PO SOLN
5.0000 mg | ORAL | Status: DC | PRN
  Administered 2016-11-04 – 2016-11-05 (×3): 10 mg via ORAL
  Filled 2016-11-04 (×3): qty 10

## 2016-11-04 MED ORDER — ACETAMINOPHEN 160 MG/5ML PO SOLN: 325.0000 mg | ORAL | Status: DC | PRN

## 2016-11-04 NOTE — Progress Notes (Signed)
1 Day Post-Op  Subjective: Very talkative this AM.  Sore, but pain not out of control.  No nausea.    Objective: Vital signs in last 24 hours: Temp:  [97.4 F (36.3 C)-98.8 F (37.1 C)] 98.3 F (36.8 C) (02/17 0800) Pulse Rate:  [63-86] 86 (02/17 0735) Resp:  [11-22] 20 (02/17 0735) BP: (95-156)/(59-80) 156/69 (02/17 0735) SpO2:  [90 %-96 %] 92 % (02/17 0735) Weight:  [110.9 kg (244 lb 7.8 oz)] 110.9 kg (244 lb 7.8 oz) (02/16 1327) Last BM Date:  (PTA)  Intake/Output from previous day: 02/16 0701 - 02/17 0700 In: 3016.7 [I.V.:2816.7] Out: 1330 [Urine:1280; Blood:50] Intake/Output this shift: No intake/output data recorded.  General appearance: alert, cooperative and no distress Resp: breathing comfortably Cardio: regular rate and rhythm GI: soft, sl distended upper abdomen.  incisions c/d/i.  Lab Results:   Recent Labs  11/03/16 1401 11/04/16 0747  WBC 9.3 8.8  HGB 12.9 12.7  HCT 39.4 38.2  PLT 166 148*   BMET  Recent Labs  11/03/16 1401  CREATININE 1.23*   PT/INR No results for input(s): LABPROT, INR in the last 72 hours. ABG No results for input(s): PHART, HCO3 in the last 72 hours.  Invalid input(s): PCO2, PO2  Studies/Results: No results found.  Anti-infectives: Anti-infectives    Start     Dose/Rate Route Frequency Ordered Stop   11/03/16 0530  cefoTEtan in Dextrose 5% (CEFOTAN) IVPB 2 g     2 g Intravenous On call to O.R. 11/03/16 0530 11/03/16 0751      Assessment/Plan: s/p Procedure(s): LAPAROSCOPIC NISSEN AND  REPAIR OF HIATAL HERNIA, (N/A) aggressive pulmonary toilet.  reviewed IS use  OOB Leave in ICU for sats around 90%.   Coreg, lasix for h/o chronic diastolic CHF.   Albuterol, singulair for COPD.   Zestril/lasix for HTN. Clears today.   LOS: 1 day    [facility] 11/04/2016

## 2016-11-05 MED ORDER — OXYCODONE HCL 5 MG PO TABS: 10.0000 mg | ORAL_TABLET | ORAL | Status: DC | PRN

## 2016-11-05 MED ORDER — POTASSIUM CHLORIDE CRYS ER 20 MEQ PO TBCR
20.0000 meq | EXTENDED_RELEASE_TABLET | Freq: Three times a day (TID) | ORAL | Status: DC
  Administered 2016-11-05 (×3): 20 meq via ORAL
  Filled 2016-11-05 (×3): qty 1

## 2016-11-05 MED ORDER — SODIUM CHLORIDE 0.9% FLUSH: 3.0000 mL | INTRAVENOUS | Status: DC | PRN

## 2016-11-05 MED ORDER — SODIUM CHLORIDE 0.9 % IV SOLN: 250.0000 mL | INTRAVENOUS | Status: DC | PRN

## 2016-11-05 MED ORDER — LACTATED RINGERS IV SOLN
Freq: Every day | INTRAVENOUS | Status: DC | PRN
  Administered 2016-11-05: 20:00:00 via INTRAVENOUS

## 2016-11-05 MED ORDER — SODIUM CHLORIDE 0.9% FLUSH
3.0000 mL | Freq: Two times a day (BID) | INTRAVENOUS | Status: DC
  Administered 2016-11-05 (×2): 3 mL via INTRAVENOUS

## 2016-11-05 MED ORDER — ESCITALOPRAM OXALATE 20 MG PO TABS
10.0000 mg | ORAL_TABLET | Freq: Two times a day (BID) | ORAL | Status: DC
  Administered 2016-11-05 – 2016-11-11 (×12): 10 mg via ORAL
  Filled 2016-11-05 (×13): qty 1

## 2016-11-05 NOTE — Progress Notes (Signed)
2 Days Post-Op  Subjective: Very talkative this AM.  Sore, but pain not out of control.  No nausea.  Got IV potassium last night.  Complains of a lot of pain this AM.  Continues to have periodic drop in sats.  Using IS.    Objective: Vital signs in last 24 hours: Temp:  [97.3 F (36.3 C)-98.1 F (36.7 C)] 97.8 F (36.6 C) (02/18 0800) Pulse Rate:  [74-90] 77 (02/18 0045) Resp:  [14-23] 14 (02/18 0045) BP: (104-134)/(55-110) 104/55 (02/18 0045) SpO2:  [90 %-96 %] 91 % (02/18 0045) Last BM Date:  (PTA)  Intake/Output from previous day: 02/17 0701 - 02/18 0700 In: 265 [IV Piggyback:265] Out: 4375 [Urine:4375] Intake/Output this shift: No intake/output data recorded.  General appearance: alert, cooperative and no distress Resp: breathing comfortably Cardio: regular rate and rhythm GI: soft, sl distended upper abdomen.  incisions c/d/i.  Lab Results:   Recent Labs  11/03/16 1401 11/04/16 0747  WBC 9.3 8.8  HGB 12.9 12.7  HCT 39.4 38.2  PLT 166 148*   BMET  Recent Labs  11/03/16 1401 11/04/16 0747  NA  --  136  K  --  3.4*  CL  --  102  CO2  --  27  GLUCOSE  --  117*  BUN  --  22*  CREATININE 1.23* 0.87  CALCIUM  --  9.0   PT/INR  Recent Labs  11/04/16 0747  LABPROT 13.6  INR 1.03   ABG No results for input(s): PHART, HCO3 in the last 72 hours.  Invalid input(s): PCO2, PO2  Studies/Results: Dg Ugi W/water Sol Cm  Result Date: 11/04/2016 CLINICAL DATA:  Post Nissen fundoplication.  Evaluate for leak. Patient with history of contrast allergy, including oral contrast. As such, permission to perform contrast swallow examination with thin barium was obtained by covering surgeon, Dr. Barry Dienes. EXAM: WATER SOLUBLE UPPER GI SERIES TECHNIQUE: Preprocedural spot radiograph was obtained of the abdomen. Multiple spot fluoroscopic pain radiographic images were obtained in various obliquities following the ingestion of a small amount of thin barium. CONTRAST:  Thin  barium, administered orally FLUOROSCOPY TIME:  43 seconds (24.6 mGy) COMPARISON:  None FINDINGS: Preprocedural spot fluoroscopic image demonstrates a large colonic stool burden without evidence of enteric obstruction. Expected appearance of the gastric fundus following Nissen fundoplication. Barium passes through the fundoplication without significant stasis. No evidence of contrast extravasation. Contrast empties appropriately from the stomach with opacification of the proximal small bowel. IMPRESSION: Post Nissen prolonged location without evidence of complication, specifically, no evidence of leak or stricture. Electronically Signed   By: Sandi Mariscal M.D.   On: 11/04/2016 12:16    Anti-infectives: Anti-infectives    Start     Dose/Rate Route Frequency Ordered Stop   11/03/16 0530  cefoTEtan in Dextrose 5% (CEFOTAN) IVPB 2 g     2 g Intravenous On call to O.R. 11/03/16 0530 11/03/16 0751      Assessment/Plan: s/p Procedure(s): LAPAROSCOPIC NISSEN AND  REPAIR OF HIATAL HERNIA, (N/A) aggressive pulmonary toilet.  reviewed IS use   OOB Leave in stepdown for sats around 90%.   Coreg, lasix for h/o chronic diastolic CHF.   Albuterol, singulair for COPD.   Zestril/lasix for HTN. Full liquids today. Back to oral potassium and oral oxycodone.   Recheck labs in AM.     LOS: 2 days    [facility] 11/05/2016

## 2016-11-05 NOTE — Progress Notes (Signed)
Due to National fluid shortage potassium being mixed by pharmacy 30 meq in 265 ml of NS per pharmacist verified with charge nurse. Pt complain of severe pain so had IV team restart IV and gave slowly.

## 2016-11-05 NOTE — Progress Notes (Signed)
During shift assessment, RN noticed that pt. has been hypotensive for some time. BP was 79/47(58) Pt. also appeared very drowsy and was in pain (post Nissn fundoplication). Pt. refused any pain meds. Spoke to Dr. Johney Maine. He recommended starting her on LR infusion @125 . Orders had been discontinued, so RN placed a verbal cosign required order.

## 2016-11-06 ENCOUNTER — Encounter (HOSPITAL_COMMUNITY): Payer: Self-pay | Admitting: Pulmonary Disease

## 2016-11-06 ENCOUNTER — Inpatient Hospital Stay (HOSPITAL_COMMUNITY): Payer: Medicare Other

## 2016-11-06 DIAGNOSIS — R609 Edema, unspecified: Secondary | ICD-10-CM

## 2016-11-06 DIAGNOSIS — R0602 Shortness of breath: Secondary | ICD-10-CM

## 2016-11-06 DIAGNOSIS — Z9889 Other specified postprocedural states: Secondary | ICD-10-CM

## 2016-11-06 DIAGNOSIS — N170 Acute kidney failure with tubular necrosis: Secondary | ICD-10-CM

## 2016-11-06 LAB — CBC
HCT: 41.7 % (ref 36.0–46.0)
Hemoglobin: 13.4 g/dL (ref 12.0–15.0)
MCH: 28.3 pg (ref 26.0–34.0)
MCHC: 32.1 g/dL (ref 30.0–36.0)
MCV: 88.2 fL (ref 78.0–100.0)
PLATELETS: 131 10*3/uL — AB (ref 150–400)
RBC: 4.73 MIL/uL (ref 3.87–5.11)
RDW: 16.8 % — AB (ref 11.5–15.5)
WBC: 8.1 10*3/uL (ref 4.0–10.5)

## 2016-11-06 LAB — COMPREHENSIVE METABOLIC PANEL WITH GFR
ALT: 211 U/L — ABNORMAL HIGH (ref 14–54)
AST: 192 U/L — ABNORMAL HIGH (ref 15–41)
Albumin: 2.9 g/dL — ABNORMAL LOW (ref 3.5–5.0)
Alkaline Phosphatase: 168 U/L — ABNORMAL HIGH (ref 38–126)
Anion gap: 12 (ref 5–15)
BUN: 36 mg/dL — ABNORMAL HIGH (ref 6–20)
CO2: 26 mmol/L (ref 22–32)
Calcium: 9.2 mg/dL (ref 8.9–10.3)
Chloride: 97 mmol/L — ABNORMAL LOW (ref 101–111)
Creatinine, Ser: 3.1 mg/dL — ABNORMAL HIGH (ref 0.44–1.00)
GFR calc Af Amer: 17 mL/min — ABNORMAL LOW
GFR calc non Af Amer: 15 mL/min — ABNORMAL LOW
Glucose, Bld: 112 mg/dL — ABNORMAL HIGH (ref 65–99)
Potassium: 4.5 mmol/L (ref 3.5–5.1)
Sodium: 135 mmol/L (ref 135–145)
Total Bilirubin: 1.3 mg/dL — ABNORMAL HIGH (ref 0.3–1.2)
Total Protein: 6.5 g/dL (ref 6.5–8.1)

## 2016-11-06 LAB — CREATININE, URINE, RANDOM: CREATININE, URINE: 112.38 mg/dL

## 2016-11-06 LAB — SODIUM, URINE, RANDOM: Sodium, Ur: 78 mmol/L

## 2016-11-06 MED ORDER — MORPHINE SULFATE (PF) 4 MG/ML IV SOLN: 1.0000 mg | INTRAVENOUS | Status: DC | PRN

## 2016-11-06 MED ORDER — ACETAMINOPHEN 160 MG/5ML PO SOLN
650.0000 mg | Freq: Four times a day (QID) | ORAL | Status: DC
  Filled 2016-11-06: qty 20.3

## 2016-11-06 MED ORDER — SODIUM CHLORIDE 0.9 % IV SOLN
INTRAVENOUS | Status: DC
Start: 2016-11-06 — End: 2016-11-11
  Administered 2016-11-06 – 2016-11-11 (×6): via INTRAVENOUS

## 2016-11-06 MED ORDER — OXYCODONE HCL 5 MG PO TABS
5.0000 mg | ORAL_TABLET | ORAL | Status: DC | PRN
  Administered 2016-11-06: 5 mg via ORAL
  Filled 2016-11-06: qty 1

## 2016-11-06 MED ORDER — LACTATED RINGERS IV BOLUS (SEPSIS)
1000.0000 mL | Freq: Once | INTRAVENOUS | Status: AC
  Administered 2016-11-06: 1000 mL via INTRAVENOUS

## 2016-11-06 MED ORDER — FENTANYL CITRATE (PF) 100 MCG/2ML IJ SOLN
12.5000 ug | INTRAMUSCULAR | Status: DC | PRN
  Administered 2016-11-07 – 2016-11-10 (×18): 12.5 ug via INTRAVENOUS
  Filled 2016-11-06 (×19): qty 2

## 2016-11-06 MED ORDER — SODIUM CHLORIDE 0.9 % IV BOLUS (SEPSIS)
1000.0000 mL | Freq: Once | INTRAVENOUS | Status: AC
  Administered 2016-11-06: 1000 mL via INTRAVENOUS

## 2016-11-06 MED ORDER — DEXTROSE-NACL 5-0.45 % IV SOLN
INTRAVENOUS | Status: DC
  Administered 2016-11-06: 08:00:00 via INTRAVENOUS

## 2016-11-06 MED ORDER — ACETAMINOPHEN 160 MG/5ML PO SOLN
650.0000 mg | Freq: Three times a day (TID) | ORAL | Status: DC | PRN
  Administered 2016-11-07: 650 mg via ORAL
  Filled 2016-11-06: qty 20.3

## 2016-11-06 MED ORDER — LACTATED RINGERS IV BOLUS (SEPSIS): 1000.0000 mL | Freq: Three times a day (TID) | INTRAVENOUS | Status: DC | PRN

## 2016-11-06 MED ORDER — METHOCARBAMOL 500 MG PO TABS
1000.0000 mg | ORAL_TABLET | Freq: Four times a day (QID) | ORAL | Status: DC
  Filled 2016-11-06: qty 2

## 2016-11-06 MED ORDER — METHOCARBAMOL 500 MG PO TABS: 1000.0000 mg | ORAL_TABLET | Freq: Four times a day (QID) | ORAL | Status: DC | PRN

## 2016-11-06 NOTE — Progress Notes (Signed)
Pt refusing foley at this time.

## 2016-11-06 NOTE — Progress Notes (Signed)
Date:  November 06, 2016 Chart reviewed for concurrent status and case management needs.  Decreased urinary out put with iv flds and  hypotensive. Will continue to follow patient progress. Discharge Planning: following for needs Expected discharge date: [id number] Velva Harman, BSN, [place], [place]

## 2016-11-06 NOTE — Progress Notes (Signed)
Paged Velna Hatchet, NP with [facility] regarding pt remaining hypotensive with a low urine output (13mL total), bolus ordered and in process.

## 2016-11-06 NOTE — Consult Note (Signed)
PULMONARY / CRITICAL CARE MEDICINE   Name: Alexandria [name] MRN: [id number] DOB: 1950/12/02    ADMISSION DATE:  11/03/2016 CONSULTATION DATE:  11/06/16  REFERRING MD:  Dr. 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  She states she previously was on home O2 after an admission but is not anymore.    Post-operatively, she was noted to have decreased UOP and desaturations.  She was diuresed from 2/17 - 2/18.  The patient was noted to be hypotensive on the evening of 2/18 and was started on D51/2 NS @ 100 ml/hr and given an LR bolus.  Per patient report, she states she does not regularly take her home lasix > only takes as needed pending lower extremity swelling.  She also does not take her lisinopril / BP meds as prescribed.  Admit Sr Cr 1.23 > rose to 3.10 on 2/19.  Of note, she also has had elevated LFT's.  Intra-operative liver exam noted extensive fatty liver / cirrhosis and biopsy was taken given hx of breast cancer with benign findings per Dr. Hassell Done.    PCCM consulted 2/19 for hypotension and decreased UOP.     PAST MEDICAL HISTORY :  She  has a past medical history of Anxiety; Breast cancer ([place]) (10/02/12); Chronic back pain; Chronic bronchitis ([place]); Complication of anesthesia; Daily headache; DDD (degenerative disc disease), cervical; DDD (degenerative disc disease), lumbosacral; DDD (degenerative disc disease), thoracolumbar; Diastolic dysfunction; Diastolic heart failure ([place]); Fibromyalgia; GAD (generalized anxiety disorder); GERD (gastroesophageal reflux disease); H/O hiatal hernia; Heart murmur; History of blood transfusion ("3-4"); pulmonary embolus;  radiation therapy (03/04/13- 04/17/13); Hyperlipidemia; Hypertension; Iron deficiency anemia; Lymphedema; Obesity; On home oxygen therapy; Peripheral neuropathic pain; Pneumonia; PONV (postoperative nausea and vomiting); Recurrent major depression ([place]) (06/2014); S/P radiation therapy ( 03/04/2013-04/17/2013); and Status post chemotherapy.  PAST SURGICAL HISTORY: She  has a past surgical history that includes Cesarean section; Partial colectomy (N/A, 07/04/2014); Tonsillectomy; Appendectomy; Breast biopsy (Right); Breast lumpectomy (Right); Inguinal hernia repair (Bilateral); Umbilical hernia repair (X 2); Dilation and curettage of uterus ("numerous"); Abdominal hysterectomy; Foot surgery (Right, ~ 2013 X 3); and Hiatal hernia repair (N/A, 11/03/2016).  Allergies  Allergen Reactions  . Contrast Media [Iodinated Diagnostic Agents] Anaphylaxis  . Solu-Medrol [Methylprednisolone Acetate] Other (See Comments)    Pt. States she is not allergic, just can't tolerate medication well.   . Albuterol Other (See Comments)    Anxious   . Prednisone Other (See Comments)    Pt gets very agitated when she takes high doses of steroids    No current facility-administered medications on file prior to encounter.    Current Outpatient Prescriptions on File Prior to Encounter  Medication Sig  . albuterol (PROVENTIL HFA;VENTOLIN HFA) 108 (90 Base) MCG/ACT inhaler Inhale 2 puffs into the lungs every 6 (six) hours as needed for wheezing or shortness of breath.  . cyclobenzaprine (FLEXERIL) 10 MG tablet Take 10 mg by mouth at bedtime.   . docusate sodium 100 MG CAPS Take 100 mg by mouth 2 (two) times daily. (Patient taking differently: Take 100 mg by mouth 2 (two) times daily as needed (constipation). )  . escitalopram (LEXAPRO) 10 MG tablet TAKE  1 TABLET BY MOUTH TWICE DAILY  . LORazepam (ATIVAN) 0.5 MG tablet Take 1 tablet (0.5 mg total) by mouth every 8 (eight) hours as needed. for anxiety (Patient taking differently:  Take 0.5 mg by mouth 3 (three) times daily. )  . lubiprostone (AMITIZA) 24 MCG capsule Take 1 capsule (24 mcg total) by mouth 2 (two) times daily with a meal.  . montelukast (SINGULAIR) 5 MG chewable tablet Chew 1 tablet (5 mg total) by mouth at bedtime.  Marland Kitchen oxyCODONE-acetaminophen (PERCOCET) 10-325 MG tablet Take 1 tablet by mouth every 6 (six) hours as needed for pain. (Patient taking differently: Take 1 tablet by mouth 4 (four) times daily. )  . ranitidine (ZANTAC) 150 MG tablet Take 1 tablet (150 mg total) by mouth at bedtime. (Patient taking differently: Take 150 mg by mouth 2 (two) times daily. )  . metolazone (ZAROXOLYN) 2.5 MG tablet Take 1 tablet (2.5 mg total) by mouth daily as needed (fluid gain).    FAMILY HISTORY:  Her indicated that her mother is deceased. She indicated that her father is deceased. She indicated that the status of her sister is unknown. She indicated that the status of her maternal grandmother is unknown. She indicated that the status of her maternal grandfather is unknown. She indicated that the status of her neg hx is unknown.    SOCIAL HISTORY: She  reports that she has never smoked. She has never used smokeless tobacco. She reports that she does not drink alcohol or use drugs.  REVIEW OF SYSTEMS:  POSITIVES IN BOLD Gen: Denies fever, chills, weight change, fatigue, night sweats HEENT: Denies blurred vision, double vision, hearing loss, tinnitus, sinus congestion, rhinorrhea, sore throat, neck stiffness, dysphagia PULM: Denies shortness of breath, cough, sputum production, hemoptysis, wheezing CV: Denies chest pain, edema, orthopnea, paroxysmal nocturnal dyspnea, palpitations GI: Denies abdominal pain, nausea, vomiting, diarrhea, hematochezia, melena, constipation, change in bowel habits GU: Denies dysuria, hematuria, polyuria, oliguria, urethral discharge Endocrine: Denies hot or cold intolerance, polyuria, polyphagia or appetite change Derm: Denies rash, dry  skin, scaling or peeling skin change Heme: Denies easy bruising, bleeding, bleeding gums Neuro: Denies headache, numbness, weakness, slurred speech, loss of memory or consciousness   SUBJECTIVE: Pt reports mild abdominal pain.    VITAL SIGNS: BP (!) 84/47 (BP Location: Left Arm)   Pulse (!) 125   Temp 97.4 F (36.3 C) (Oral)   Resp 17   Ht 5\' 6"  (1.676 m)   Wt 244 lb 7.8 oz (110.9 kg)   LMP 01/30/2005   SpO2 92%   BMI 39.46 kg/m   HEMODYNAMICS:    VENTILATOR SETTINGS:    INTAKE / OUTPUT: I/O last 3 completed shifts: In: 2009.2 [P.O.:240; I.V.:504.2; IV Piggyback:1265] Out: 1005 [Urine:1005]  PHYSICAL EXAMINATION: General:  Obese female in NAD, appears drowsy, pt keeps pulling O2 out during exam Neuro:  Awakens to voice, appropriate, MAE, pupils 63mm =/R HEENT:  MM pink/moist, no jvd Cardiovascular:  s1s2 rrr, no m/r/g, ST on monitor  Lungs:  Even/non-labored, lungs bilaterally diminished, poor cough Abdomen:  Obese/soft, bsx4 active, mild tenderness to pain  Musculoskeletal:  No acute deformities  Skin:  Warm/dry, no rashes or lesions   LABS:  BMET  Recent Labs Lab 11/03/16 1401 11/04/16 0747 11/06/16 0351  NA  --  136 135  K  --  3.4* 4.5  CL  --  102 97*  CO2  --  27 26  BUN  --  22* 36*  CREATININE 1.23* 0.87 3.10*  GLUCOSE  --  117* 112*    Electrolytes  Recent Labs Lab 11/04/16 0747 11/06/16 0351  CALCIUM 9.0 9.2    CBC  Recent Labs Lab 11/03/16 1401 11/04/16 0747 11/06/16 0351  WBC 9.3 8.8 8.1  HGB 12.9 12.7 13.4  HCT 39.4 38.2 41.7  PLT 166 148* 131*    Coag's  Recent Labs Lab 11/04/16 0747  INR 1.03    Sepsis Markers No results for input(s): LATICACIDVEN, PROCALCITON, O2SATVEN in the last 168 hours.  ABG No results for input(s): PHART, PCO2ART, PO2ART in the last 168 hours.  Liver Enzymes  Recent Labs Lab 11/04/16 0747 11/06/16 0351  AST 333* 192*  ALT 241* 211*  ALKPHOS 182* 168*  BILITOT 1.4* 1.3*   ALBUMIN 3.0* 2.9*    Cardiac Enzymes No results for input(s): TROPONINI, PROBNP in the last 168 hours.  Glucose No results for input(s): GLUCAP in the last 168 hours.  Imaging No results found.   STUDIES:    CULTURES:   ANTIBIOTICS:   SIGNIFICANT EVENTS: 2/16  Admit for planned laparoscopic repair of hiatal hernia, nissen fundoplication  [id number]  PCCM consulted for decreased UOP / AKI, hypotension  LINES/TUBES:   DISCUSSION: 66 y/o F admitted for laparoscopic repair of hiatal hernia, nissen fundoplication  ASSESSMENT / PLAN:  PULMONARY A: Acute Hypoxic Respiratory Failure - in setting of atelectasis, morbid obesity, narcotics Hx Asthma - takes Symbicort "as needed", followed by Dr. Ashok Cordia Hx of PE - remote, VQ in 2014 unremarkable P:   Pulmonary hygiene - IS Mobilize - OOB BID  Wean O2 for sats > 90% Would recommend nocturnal CPAP but patient can not wear due to claustrophobia  Intermittent CXR  CARDIOVASCULAR A:  Hypotension - suspect volume depletion, narcotics, ongoing antihypertensives (ACE-I) and diuresis P:  SDU monitoring  Hold coreg, lisinopril Previously taken off lisinopril due to cough Change IVF to NS @ 165ml/hr  Hold further diuresis   RENAL A:   AKI - secondary to above P:   Trend BMP / UOP  Replace electrolytes as indicated   GASTROINTESTINAL A:   S/P Laparoscopic Repair of Hiatal Hernia with Nissen Fundoplication Increased LFT's  S/P Liver Biopsy - noted lesions intraop, see op note  Morbid Obesity  P:   Post-op rec's per [facility] Full liquid diet as tolerated  Trend LFT's  HEMATOLOGIC A:   Thrombocytopenia - mild, no evidence of bleeding  P:  Trend CBC   INFECTIOUS A:   No acute infectious process S/P Laparoscopic Repair of Hiatal Hernia  P:   Trend WBC / fever curve  Monitor wound sites   ENDOCRINE A:   Hyperglycemia    P:   Monitor glucose on BMP   NEUROLOGIC A:   Post-operative Pain Chronic Pain  P:    Reduce narcotics  Minimize sedating medications as able    FAMILY  - Updates: Patient updated on plan of care 2/19.   - Inter-disciplinary family meet or Palliative Care meeting due by:  2/26   Alexandria Gens, NP-C [facility] Pgr: [phone] or if no answer [phone] 11/06/2016, 9:30 AM  Attending Note:  I have examined patient, reviewed labs, studies and notes. I have discussed the case with B Ollis, and I agree with the data and plans as amended above.   66 yo woman with a hx obesity, chronic pain, remote breast CA, HTN w dCHF, possible asthma (equivical spirometry 10/16, ? Mixed disease). She s/p HH repair and Nissen. Note also  that fatty liver changes were seen intra-op. Post-op course c/b by acute oliguric renal failure, hypotension. On my eval she is awake, a bit confused (? Narcs), obese woman, borderline BP MAP 62. Lungs decreased at bases, no wheeze. She has been on her home BP regimen and diuretics post op but acknowledges that she does not use reliably on a schedule at home. Suspect that she has ATN due to meds, hypoperfusion. Will hold her ACE-I, diuretics. Try to minimize her narcs. Give IVF back and follow UOP, renal fxn.    Baltazar Apo, MD, PhD 11/06/2016, 10:47 AM [name] [phone] or if no answer [phone]

## 2016-11-06 NOTE — Progress Notes (Signed)
VASCULAR LAB PRELIMINARY  PRELIMINARY  PRELIMINARY  PRELIMINARY  Bilateral lower extremity venous duplex completed.    Preliminary report:  Bilateral:  No evidence of DVT, superficial thrombosis, or Baker's Cyst.   Roylee Chaffin, RVS 11/06/2016, 9:17 AM

## 2016-11-06 NOTE — Progress Notes (Signed)
Note: Portions of this report may have been transcribed using voice recognition software. Every effort was made to ensure accuracy; however, inadvertent computerized transcription errors may be present.   Any transcriptional errors that result from this process are unintentional.             Alexandria [name]  December 25, 1950 [id number]  Patient Care Team: Marletta Lor, MD as PCP - General (Internal Medicine)  This patient is a 66 y.o.female   Called by ICU RN that patient has had low BP MAP 50's but SBP 70-80s.  No UOP for ~12 hours recorded.  Asked that she clarify I&O with prior shift nurser in the AM  I recommended another IVF bolus - this was put in erroneously last shift as 1 L over 8 hours.   Tried to clarify order & then put them in myself LR 1L IV bolus over 1 hour Q8H PRN SBP < 90 or UOP < 250 q8h  Hold diuretics Place foley (pt had had minimal UOP w I&O cath I clarified parameters to lower BP meds, trying to leave some low dose Coreg as possible.  She usually lives w low BP apprently Get labs in AM  Patient Active Problem List   Diagnosis Date Noted  . Status post laparoscopic Nissen fundoplication 31/54/0086  . Asthma 08/21/2016  . Malignant neoplasm of upper-outer quadrant of female breast ([place])   . Hyperlipidemia 10/23/2014  . Essential hypertension   . Lymphedema 09/07/2014  . Nocturnal hypoxemia 08/18/2014  . Ventral incisional hernia with obstruction 07/05/2014  . Coronary artery calcification 05/22/2014  . Mediastinal lymphadenopathy 01/29/2014  . Hiatal hernia 01/23/2014  . Symptomatic anemia 11/19/2013  . Hx of radiation therapy   . SVT (supraventricular tachycardia) ([place]) 04/19/2013  . Vocal cord dysfunction 04/15/2013  . Acute on chronic diastolic CHF (congestive heart failure) ([name]) 04/14/2013  . Anxiety state 04/14/2013  . Hypokalemia 04/13/2013  . Iron deficiency anemia 04/13/2013  . Hypertension 04/08/2013  . Chronic back pain 04/08/2013  .  Morbid obesity ([name]) 04/08/2013  . Adjustment disorder with mixed anxiety and depressed mood 04/08/2013  . Lower extremity edema 04/08/2013  . Systolic murmur 76/19/5093  . Chronic constipation 04/08/2013  . Breast cancer, right breast ([name]) 02/21/2013    Past Medical History:  Diagnosis Date  . Anxiety   . Breast cancer ([place]) 10/02/12   Invasive Ductal Carcinoma of the Right Upper Outer Quadrant - ER (>90%), PR - Neg., Her2 Neu Negative, Ki-67 Unknown  . Chronic back pain    "all the way up and down"  . Chronic bronchitis ([name])    "get it q yr" (09/07/2014)  . Complication of anesthesia    "I come out really anxious; need Ativan to ease me out" (09/07/2014)  . Daily headache    "recently" (09/07/2014)  . DDD (degenerative disc disease), cervical   . DDD (degenerative disc disease), lumbosacral   . DDD (degenerative disc disease), thoracolumbar   . Diastolic dysfunction   . Diastolic heart failure ([place])   . Fibromyalgia   . GAD (generalized anxiety disorder)   . GERD (gastroesophageal reflux disease)   . H/O hiatal hernia   . Heart murmur   . History of blood transfusion "3-4"   "never can tell why; don't know where the blood was coming from; doesn't show up in stool or urine"  . Hx of pulmonary embolus    During c-Section  . Hx of radiation therapy 03/04/13- 04/17/13   right breast 50  Gy 25 fractions, right breast boost 10 Gy 5 fractions  . Hyperlipidemia   . Hypertension   . Iron deficiency anemia   . Lymphedema    bilateral lower extremity  . Obesity    Class 2  . On home oxygen therapy    "2L just at night" (09/07/2014)  . Peripheral neuropathic pain   . Pneumonia    "at least twice/yr" (09/07/2014)  . PONV (postoperative nausea and vomiting)   . Recurrent major depression ([name]) 06/2014   Seen by Dr. Louretta Shorten  . S/P radiation therapy  03/04/2013-04/17/2013   1) Right breast / 50 Gy in 25 fractions/ 2) Right breast boost / 10 Gy in 5 fractions  . Status post  chemotherapy    4 cycles of Taxotere and cytoxan    Past Surgical History:  Procedure Laterality Date  . ABDOMINAL HYSTERECTOMY    . APPENDECTOMY    . BREAST BIOPSY Right   . BREAST LUMPECTOMY Right   . CESAREAN SECTION     [phone]  . DILATION AND CURETTAGE OF UTERUS  "numerous"  . FOOT SURGERY Right ~ 2013 X 3   "put pins in but pins kept breaking"  . HIATAL HERNIA REPAIR N/A 11/03/2016   Procedure: LAPAROSCOPIC NISSEN AND  REPAIR OF HIATAL HERNIA,;  Surgeon: Johnathan Hausen, MD;  Location: [facility];  Service: General;  Laterality: N/A;  . INGUINAL HERNIA REPAIR Bilateral    "cancer"  . PARTIAL COLECTOMY N/A 07/04/2014   Procedure: REPAIR OF INCARCERATED INCISIONAL HERNIA WITH MESH;  Surgeon: Excell Seltzer, MD;  Location: [facility];  Service: General;  Laterality: N/A;  . TONSILLECTOMY    . UMBILICAL HERNIA REPAIR  X 2    Social History   Social History  . Marital status: Married    Spouse name: N/A  . Number of children: N/A  . Years of education: N/A   Occupational History  . retired Therapist, sports    Social History Main Topics  . Smoking status: Never Smoker  . Smokeless tobacco: Never Used     Comment: Through father  . Alcohol use No  . Drug use: No  . Sexual activity: Not Currently   Other Topics Concern  . Not on file   Social History Narrative   Originally from [place], [place]. Previously lived in [place]. [place] [name] to [facility] in 2014. Previously worked as an Warden/ranger. No known TB exposure. No pets currently. No bird exposure. Minimal mold exposure.     Family History  Problem Relation Age of Onset  . Parkinson's disease Mother   . Emphysema Maternal Grandfather   . COPD Maternal Grandfather   . COPD Sister   . Heart attack Sister   . COPD Maternal Grandmother   . CAD Neg Hx     Current Facility-Administered Medications  Medication Dose Route Frequency Provider Last Rate Last Dose  . acetaminophen (TYLENOL) solution 650 mg  650 mg Oral QID Michael Boston, MD      .  albuterol (PROVENTIL) (2.5 MG/3ML) 0.083% nebulizer solution 2.5 mg  2.5 mg Inhalation Q6H PRN Johnathan Hausen, MD      . carvedilol (COREG) tablet 6.25 mg  6.25 mg Oral BID WC Michael Boston, MD   6.25 mg at 11/05/16 0959  . dextrose 5 %-0.45 % sodium chloride infusion   Intravenous Continuous Johnathan Hausen, MD 100 mL/hr at 11/06/16 0800    . diphenhydrAMINE (BENADRYL) injection 12.5-25 mg  12.5-25 mg Intravenous Q6H PRN Michael Boston, MD      .  escitalopram (LEXAPRO) tablet 10 mg  10 mg Oral BID Stark Klein, MD   10 mg at 11/05/16 0958  . heparin injection 5,000 Units  5,000 Units Subcutaneous Q8H Michael Boston, MD   5,000 Units at 11/06/16 0076  . hydrALAZINE (APRESOLINE) injection 10 mg  10 mg Intravenous Q2H PRN Johnathan Hausen, MD      . lactated ringers bolus 1,000 mL  1,000 mL Intravenous Once Michael Boston, MD   1,000 mL at 11/06/16 0829  . lisinopril (PRINIVIL,ZESTRIL) tablet 5 mg  5 mg Oral Daily Johnathan Hausen, MD   5 mg at 11/05/16 0958  . LORazepam (ATIVAN) tablet 0.5 mg  0.5 mg Oral Q8H PRN Johnathan Hausen, MD   0.5 mg at 11/04/16 1839  . methocarbamol (ROBAXIN) tablet 1,000 mg  1,000 mg Oral QID Michael Boston, MD      . montelukast (SINGULAIR) chewable tablet 5 mg  5 mg Oral QHS Johnathan Hausen, MD   5 mg at 11/05/16 2140  . morphine 4 MG/ML injection 1 mg  1 mg Intravenous Q1H PRN Johnathan Hausen, MD   1 mg at 11/05/16 0254  . ondansetron (ZOFRAN-ODT) disintegrating tablet 4 mg  4 mg Oral Q6H PRN Johnathan Hausen, MD   4 mg at 11/04/16 1436   Or  . ondansetron [facility]) injection 4 mg  4 mg Intravenous Q6H PRN Johnathan Hausen, MD   4 mg at 11/04/16 0432  . oxyCODONE (Oxy IR/ROXICODONE) immediate release tablet 10-20 mg  10-20 mg Oral Q4H PRN Stark Klein, MD      . potassium chloride SA (K-DUR,KLOR-CON) CR tablet 20 mEq  20 mEq Oral TID Michael Boston, MD   20 mEq at 11/05/16 2130  . prochlorperazine (COMPAZINE) injection 5 mg  5 mg Intravenous Q4H PRN Michael Boston, MD         Allergies   Allergen Reactions  . Contrast Media [Iodinated Diagnostic Agents] Anaphylaxis  . Solu-Medrol [Methylprednisolone Acetate] Other (See Comments)    Pt. States she is not allergic, just can't tolerate medication well.   . Albuterol Other (See Comments)    Anxious   . Prednisone Other (See Comments)    Pt gets very agitated when she takes high doses of steroids    BP (!) 84/47 (BP Location: Left Arm)   Pulse (!) 125   Temp 97.4 F (36.3 C) (Oral)   Resp 17   Ht _0  (1.676 m)   Wt 110.9 kg (244 lb 7.8 oz)   LMP 01/30/2005   SpO2 92%   BMI 39.46 kg/m   Dg Ugi W/water Sol Cm  Result Date: 11/04/2016 CLINICAL DATA:  Post Nissen fundoplication.  Evaluate for leak. Patient with history of contrast allergy, including oral contrast. As such, permission to perform contrast swallow examination with thin barium was obtained by covering surgeon, Dr. Barry Dienes. EXAM: WATER SOLUBLE UPPER GI SERIES TECHNIQUE: Preprocedural spot radiograph was obtained of the abdomen. Multiple spot fluoroscopic pain radiographic images were obtained in various obliquities following the ingestion of a small amount of thin barium. CONTRAST:  Thin barium, administered orally FLUOROSCOPY TIME:  43 seconds (24.6 mGy) COMPARISON:  None FINDINGS: Preprocedural spot fluoroscopic image demonstrates a large colonic stool burden without evidence of enteric obstruction. Expected appearance of the gastric fundus following Nissen fundoplication. Barium passes through the fundoplication without significant stasis. No evidence of contrast extravasation. Contrast empties appropriately from the stomach with opacification of the proximal small bowel. IMPRESSION: Post Nissen prolonged location without evidence of complication, specifically,  no evidence of leak or stricture. Electronically Signed   By: Sandi Mariscal M.D.   On: 11/04/2016 12:16

## 2016-11-06 NOTE — Progress Notes (Signed)
Called Dr Johney Maine with update Patients BP continues to be soft and MAP <60 and has had no urine output on night shift per 7-11  RN and none since I came on. IV is running LR @ 15ml/hr. I did bladder scan and found 30 ml. I performed in/out cath and got minimal urine in cath and around, not enough to measure.I received orders for one liter over one hour and replace foley. Labs in for morning and Dr Hassell Done would be in to round on patient in am. No need to call back if SBP is over 70.

## 2016-11-06 NOTE — Progress Notes (Signed)
Called regarding hypotension and low UOP.  Pt received dose of oxycodone earlier in am.  Suspect hypovolemia compounded by narcotics.    Plan: D/C oxycodone for now PRN low dose fentanyl for severe pain > 12.5 Q2 PRN  1L NS bolus now Assess FENa   Noe Gens, NP-C [facility] Pgr: [phone] or if no answer [phone] 11/06/2016, 1:25 PM

## 2016-11-07 ENCOUNTER — Inpatient Hospital Stay (HOSPITAL_COMMUNITY): Payer: Medicare Other

## 2016-11-07 DIAGNOSIS — I9589 Other hypotension: Secondary | ICD-10-CM

## 2016-11-07 LAB — COMPREHENSIVE METABOLIC PANEL
ALBUMIN: 2.6 g/dL — AB (ref 3.5–5.0)
ALT: 157 U/L — ABNORMAL HIGH (ref 14–54)
ANION GAP: 9 (ref 5–15)
AST: 139 U/L — ABNORMAL HIGH (ref 15–41)
Alkaline Phosphatase: 188 U/L — ABNORMAL HIGH (ref 38–126)
BILIRUBIN TOTAL: 1.5 mg/dL — AB (ref 0.3–1.2)
BUN: 34 mg/dL — ABNORMAL HIGH (ref 6–20)
CHLORIDE: 99 mmol/L — AB (ref 101–111)
CO2: 29 mmol/L (ref 22–32)
Calcium: 8.6 mg/dL — ABNORMAL LOW (ref 8.9–10.3)
Creatinine, Ser: 2.09 mg/dL — ABNORMAL HIGH (ref 0.44–1.00)
GFR calc Af Amer: 28 mL/min — ABNORMAL LOW (ref >60)
GFR, EST NON AFRICAN AMERICAN: 24 mL/min — AB (ref >60)
GLUCOSE: 106 mg/dL — AB (ref 65–99)
POTASSIUM: 3.9 mmol/L (ref 3.5–5.1)
Sodium: 137 mmol/L (ref 135–145)
TOTAL PROTEIN: 6.3 g/dL — AB (ref 6.5–8.1)

## 2016-11-07 LAB — CBC
HEMATOCRIT: 38.5 % (ref 36.0–46.0)
HEMOGLOBIN: 12.7 g/dL (ref 12.0–15.0)
MCH: 28.7 pg (ref 26.0–34.0)
MCHC: 33 g/dL (ref 30.0–36.0)
MCV: 87.1 fL (ref 78.0–100.0)
Platelets: 140 10*3/uL — ABNORMAL LOW (ref 150–400)
RBC: 4.42 MIL/uL (ref 3.87–5.11)
RDW: 17.1 % — ABNORMAL HIGH (ref 11.5–15.5)
WBC: 6.9 10*3/uL (ref 4.0–10.5)

## 2016-11-07 MED ORDER — LORAZEPAM 0.5 MG PO TABS
0.2500 mg | ORAL_TABLET | Freq: Three times a day (TID) | ORAL | Status: DC | PRN
  Administered 2016-11-08 – 2016-11-11 (×4): 0.25 mg via ORAL
  Filled 2016-11-07 (×4): qty 1

## 2016-11-07 NOTE — Progress Notes (Signed)
Patient ID: Alexandria [name], female   DOB: 08-14-1951, 66 y.o.   MRN: [id number] [facility] Surgery Progress Note:   4 Days Post-Op  Subjective: Mental status was confused yesterday morning.  Volume depletion apparently produced her hypovolemic shock state as well as her deterioration of creatinine.   Objective: Vital signs in last 24 hours: Temp:  [97.4 F (36.3 C)-98.9 F (37.2 C)] 98.7 F (37.1 C) (02/20 0338) Pulse Rate:  [79-125] 79 (02/20 0700) Resp:  [11-22] 13 (02/20 0700) BP: (58-127)/(32-112) 121/70 (02/20 0700) SpO2:  [89 %-97 %] 95 % (02/20 0700)  Intake/Output from previous day: 02/19 0701 - 02/20 0700 In: 5036.7 [P.O.:840; I.V.:2196.7; IV Piggyback:2000] Out: 3085 [Urine:3085] Intake/Output this shift: No intake/output data recorded.  Physical Exam: Work of breathing was slightly more labored.  Incisions OK  Lab Results:  Results for orders placed or performed during the hospital encounter of 11/03/16 (from the past 48 hour(s))  Comprehensive metabolic panel     Status: Abnormal   Collection Time: 11/06/16  3:51 AM  Result Value Ref Range   Sodium 135 135 - 145 mmol/L   Potassium 4.5 3.5 - 5.1 mmol/L    Comment: RESULT REPEATED AND VERIFIED DELTA CHECK NOTED NO VISIBLE HEMOLYSIS    Chloride 97 (L) 101 - 111 mmol/L   CO2 26 22 - 32 mmol/L   Glucose, Bld 112 (H) 65 - 99 mg/dL   BUN 36 (H) 6 - 20 mg/dL   Creatinine, Ser 3.10 (H) 0.44 - 1.00 mg/dL    Comment: RESULT REPEATED AND VERIFIED DELTA CHECK NOTED    Calcium 9.2 8.9 - 10.3 mg/dL   Total Protein 6.5 6.5 - 8.1 g/dL   Albumin 2.9 (L) 3.5 - 5.0 g/dL   AST 192 (H) 15 - 41 U/L   ALT 211 (H) 14 - 54 U/L   Alkaline Phosphatase 168 (H) 38 - 126 U/L   Total Bilirubin 1.3 (H) 0.3 - 1.2 mg/dL   GFR calc non Af Amer 15 (L) >60 mL/min   GFR calc Af Amer 17 (L) >60 mL/min    Comment: (NOTE) The eGFR has been calculated using the CKD EPI equation. This calculation has not been validated in all clinical  situations. eGFR's persistently <60 mL/min signify possible Chronic Kidney Disease.    Anion gap 12 5 - 15  CBC     Status: Abnormal   Collection Time: 11/06/16  3:51 AM  Result Value Ref Range   WBC 8.1 4.0 - 10.5 K/uL   RBC 4.73 3.87 - 5.11 MIL/uL   Hemoglobin 13.4 12.0 - 15.0 g/dL   HCT 41.7 36.0 - 46.0 %   MCV 88.2 78.0 - 100.0 fL   MCH 28.3 26.0 - 34.0 pg   MCHC 32.1 30.0 - 36.0 g/dL   RDW 16.8 (H) 11.5 - 15.5 %   Platelets 131 (L) 150 - 400 K/uL    Comment: REPEATED TO VERIFY SPECIMEN CHECKED FOR CLOTS   Sodium, urine, random     Status: None   Collection Time: 11/06/16  2:13 PM  Result Value Ref Range   Sodium, Ur 78 mmol/L    Comment: Performed at [facility], [place] [address] [place], [place] [address]  Creatinine, urine, random     Status: None   Collection Time: 11/06/16  2:13 PM  Result Value Ref Range   Creatinine, Urine 112.38 mg/dL    Comment: Performed at [facility] [address] [place], [place]  [address]  Comprehensive metabolic panel     Status: Abnormal   Collection Time: 11/07/16  3:31 AM  Result Value Ref Range   Sodium 137 135 - 145 mmol/L   Potassium 3.9 3.5 - 5.1 mmol/L   Chloride 99 (L) 101 - 111 mmol/L   CO2 29 22 - 32 mmol/L   Glucose, Bld 106 (H) 65 - 99 mg/dL   BUN 34 (H) 6 - 20 mg/dL   Creatinine, Ser 2.09 (H) 0.44 - 1.00 mg/dL    Comment: DELTA CHECK NOTED REPEATED TO VERIFY    Calcium 8.6 (L) 8.9 - 10.3 mg/dL   Total Protein 6.3 (L) 6.5 - 8.1 g/dL   Albumin 2.6 (L) 3.5 - 5.0 g/dL   AST 139 (H) 15 - 41 U/L   ALT 157 (H) 14 - 54 U/L   Alkaline Phosphatase 188 (H) 38 - 126 U/L   Total Bilirubin 1.5 (H) 0.3 - 1.2 mg/dL   GFR calc non Af Amer 24 (L) >60 mL/min   GFR calc Af Amer 28 (L) >60 mL/min    Comment: (NOTE) The eGFR has been calculated using the CKD EPI equation. This calculation has not been validated in all clinical situations. eGFR's persistently <60 mL/min signify possible Chronic Kidney Disease.     Anion gap 9 5 - 15  CBC     Status: Abnormal   Collection Time: 11/07/16  3:31 AM  Result Value Ref Range   WBC 6.9 4.0 - 10.5 K/uL   RBC 4.42 3.87 - 5.11 MIL/uL   Hemoglobin 12.7 12.0 - 15.0 g/dL   HCT 38.5 36.0 - 46.0 %   MCV 87.1 78.0 - 100.0 fL   MCH 28.7 26.0 - 34.0 pg   MCHC 33.0 30.0 - 36.0 g/dL   RDW 17.1 (H) 11.5 - 15.5 %   Platelets 140 (L) 150 - 400 K/uL    Radiology/Results: Dg Chest Port 1 View  Result Date: 11/07/2016 CLINICAL DATA:  Respiratory failure. EXAM: PORTABLE CHEST 1 VIEW COMPARISON:  07/05/2016. FINDINGS: Cardiomegaly with diffuse bilateral pulmonary infiltrates and bilateral pleural effusions. Findings consistent with congestive heart failure. Dense basilar atelectasis. No pneumothorax. IMPRESSION: 1. Congestive heart failure bilateral from interstitial edema bilateral pleural effusions. 2. Dense bibasilar atelectasis. Electronically Signed   By: Marcello Moores  Register   On: 11/07/2016 06:54    Anti-infectives: Anti-infectives    Start     Dose/Rate Route Frequency Ordered Stop   11/03/16 0530  cefoTEtan in Dextrose 5% (CEFOTAN) IVPB 2 g     2 g Intravenous On call to O.R. 11/03/16 0530 11/03/16 0751      Assessment/Plan: Problem List: Patient Active Problem List   Diagnosis Date Noted  . Status post laparoscopic Nissen fundoplication 98/92/1194  . Asthma 08/21/2016  . Malignant neoplasm of upper-outer quadrant of female breast ([place])   . Hyperlipidemia 10/23/2014  . Essential hypertension   . Lymphedema 09/07/2014  . Nocturnal hypoxemia 08/18/2014  . Ventral incisional hernia with obstruction 07/05/2014  . Coronary artery calcification 05/22/2014  . Mediastinal lymphadenopathy 01/29/2014  . Hiatal hernia 01/23/2014  . Symptomatic anemia 11/19/2013  . Hx of radiation therapy   . SVT (supraventricular tachycardia) ([place]) 04/19/2013  . Vocal cord dysfunction 04/15/2013  . Acute on chronic diastolic CHF (congestive heart failure) ([place]) 04/14/2013  .  Anxiety state 04/14/2013  . Hypokalemia 04/13/2013  . Iron deficiency anemia 04/13/2013  . Hypertension 04/08/2013  . Chronic back pain 04/08/2013  . Morbid obesity ([place]) 04/08/2013  .  Adjustment disorder with mixed anxiety and depressed mood 04/08/2013  . Lower extremity edema 04/08/2013  . Systolic murmur 24/79/9800  . Chronic constipation 04/08/2013  . Breast cancer, right breast ([place]) 02/21/2013    Volume depletion (Lasix) leading to hypovolemic shock and bump in creatinine.  Appreciate critical care assistance.  This note prompted by CDI request for documentation.   4 Days Post-Op    LOS: 4 days   Matt B. Hassell Done, MD, [facility] [phone] beeper [phone]  11/07/2016 7:41 AM

## 2016-11-07 NOTE — Progress Notes (Signed)
Patient ID: Alexandria [name], female   DOB: [birth year], 66 y.o.   MRN: [id number] [facility] Surgery Progress Note:   4 Days Post-Op  Subjective: Mental status is much more clear.  Sore but no complaints Objective: Vital signs in last 24 hours: Temp:  [97.8 F (36.6 C)-98.9 F (37.2 C)] 97.8 F (36.6 C) (02/20 0738) Pulse Rate:  [78-119] 83 (02/20 1000) Resp:  [11-22] 12 (02/20 1000) BP: (58-133)/(32-112) 108/64 (02/20 1000) SpO2:  [89 %-97 %] 93 % (02/20 1000)  Intake/Output from previous day: 02/19 0701 - 02/20 0700 In: 5036.7 [P.O.:840; I.V.:2196.7; IV Piggyback:2000] Out: 3085 [Urine:3085] Intake/Output this shift: Total I/O In: 540 [P.O.:240; I.V.:300] Out: 625 [Urine:625]  Physical Exam: Work of breathing is more normal.  Incisions OK.    Lab Results:  Results for orders placed or performed during the hospital encounter of 11/03/16 (from the past 48 hour(s))  Comprehensive metabolic panel     Status: Abnormal   Collection Time: 11/06/16  3:51 AM  Result Value Ref Range   Sodium 135 135 - 145 mmol/L   Potassium 4.5 3.5 - 5.1 mmol/L    Comment: RESULT REPEATED AND VERIFIED DELTA CHECK NOTED NO VISIBLE HEMOLYSIS    Chloride 97 (L) 101 - 111 mmol/L   CO2 26 22 - 32 mmol/L   Glucose, Bld 112 (H) 65 - 99 mg/dL   BUN 36 (H) 6 - 20 mg/dL   Creatinine, Ser 3.10 (H) 0.44 - 1.00 mg/dL    Comment: RESULT REPEATED AND VERIFIED DELTA CHECK NOTED    Calcium 9.2 8.9 - 10.3 mg/dL   Total Protein 6.5 6.5 - 8.1 g/dL   Albumin 2.9 (L) 3.5 - 5.0 g/dL   AST 192 (H) 15 - 41 U/L   ALT 211 (H) 14 - 54 U/L   Alkaline Phosphatase 168 (H) 38 - 126 U/L   Total Bilirubin 1.3 (H) 0.3 - 1.2 mg/dL   GFR calc non Af Amer 15 (L) >60 mL/min   GFR calc Af Amer 17 (L) >60 mL/min    Comment: (NOTE) The eGFR has been calculated using the CKD EPI equation. This calculation has not been validated in all clinical situations. eGFR's persistently <60 mL/min signify possible Chronic  Kidney Disease.    Anion gap 12 5 - 15  CBC     Status: Abnormal   Collection Time: 11/06/16  3:51 AM  Result Value Ref Range   WBC 8.1 4.0 - 10.5 K/uL   RBC 4.73 3.87 - 5.11 MIL/uL   Hemoglobin 13.4 12.0 - 15.0 g/dL   HCT 41.7 36.0 - 46.0 %   MCV 88.2 78.0 - 100.0 fL   MCH 28.3 26.0 - 34.0 pg   MCHC 32.1 30.0 - 36.0 g/dL   RDW 16.8 (H) 11.5 - 15.5 %   Platelets 131 (L) 150 - 400 K/uL    Comment: REPEATED TO VERIFY SPECIMEN CHECKED FOR CLOTS   Sodium, urine, random     Status: None   Collection Time: 11/06/16  2:13 PM  Result Value Ref Range   Sodium, Ur 78 mmol/L    Comment: Performed at [facility], [place] [address], [place], [place] [address]  Creatinine, urine, random     Status: None   Collection Time: 11/06/16  2:13 PM  Result Value Ref Range   Creatinine, Urine 112.38 mg/dL    Comment: Performed at [facility] [address], [place], [place] [address]  Comprehensive metabolic panel  Status: Abnormal   Collection Time: 11/07/16  3:31 AM  Result Value Ref Range   Sodium 137 135 - 145 mmol/L   Potassium 3.9 3.5 - 5.1 mmol/L   Chloride 99 (L) 101 - 111 mmol/L   CO2 29 22 - 32 mmol/L   Glucose, Bld 106 (H) 65 - 99 mg/dL   BUN 34 (H) 6 - 20 mg/dL   Creatinine, Ser 2.09 (H) 0.44 - 1.00 mg/dL    Comment: DELTA CHECK NOTED REPEATED TO VERIFY    Calcium 8.6 (L) 8.9 - 10.3 mg/dL   Total Protein 6.3 (L) 6.5 - 8.1 g/dL   Albumin 2.6 (L) 3.5 - 5.0 g/dL   AST 139 (H) 15 - 41 U/L   ALT 157 (H) 14 - 54 U/L   Alkaline Phosphatase 188 (H) 38 - 126 U/L   Total Bilirubin 1.5 (H) 0.3 - 1.2 mg/dL   GFR calc non Af Amer 24 (L) >60 mL/min   GFR calc Af Amer 28 (L) >60 mL/min    Comment: (NOTE) The eGFR has been calculated using the CKD EPI equation. This calculation has not been validated in all clinical situations. eGFR's persistently <60 mL/min signify possible Chronic Kidney Disease.    Anion gap 9 5 - 15  CBC     Status: Abnormal   Collection Time:  11/07/16  3:31 AM  Result Value Ref Range   WBC 6.9 4.0 - 10.5 K/uL   RBC 4.42 3.87 - 5.11 MIL/uL   Hemoglobin 12.7 12.0 - 15.0 g/dL   HCT 38.5 36.0 - 46.0 %   MCV 87.1 78.0 - 100.0 fL   MCH 28.7 26.0 - 34.0 pg   MCHC 33.0 30.0 - 36.0 g/dL   RDW 17.1 (H) 11.5 - 15.5 %   Platelets 140 (L) 150 - 400 K/uL    Radiology/Results: Dg Chest Port 1 View  Result Date: 11/07/2016 CLINICAL DATA:  Respiratory failure. EXAM: PORTABLE CHEST 1 VIEW COMPARISON:  07/05/2016. FINDINGS: Cardiomegaly with diffuse bilateral pulmonary infiltrates and bilateral pleural effusions. Findings consistent with congestive heart failure. Dense basilar atelectasis. No pneumothorax. IMPRESSION: 1. Congestive heart failure bilateral from interstitial edema bilateral pleural effusions. 2. Dense bibasilar atelectasis. Electronically Signed   By: Marcello Moores  Register   On: 11/07/2016 06:54    Anti-infectives: Anti-infectives    Start     Dose/Rate Route Frequency Ordered Stop   11/03/16 0530  cefoTEtan in Dextrose 5% (CEFOTAN) IVPB 2 g     2 g Intravenous On call to O.R. 11/03/16 0530 11/03/16 0751      Assessment/Plan: Problem List: Patient Active Problem List   Diagnosis Date Noted  . Status post laparoscopic Nissen fundoplication 33/35/4562  . Asthma 08/21/2016  . Malignant neoplasm of upper-outer quadrant of female breast ([place])   . Hyperlipidemia 10/23/2014  . Essential hypertension   . Lymphedema 09/07/2014  . Nocturnal hypoxemia 08/18/2014  . Ventral incisional hernia with obstruction 07/05/2014  . Coronary artery calcification 05/22/2014  . Mediastinal lymphadenopathy 01/29/2014  . Hiatal hernia 01/23/2014  . Symptomatic anemia 11/19/2013  . Hx of radiation therapy   . SVT (supraventricular tachycardia) ([place]) 04/19/2013  . Vocal cord dysfunction 04/15/2013  . Acute on chronic diastolic CHF (congestive heart failure) ([place]) 04/14/2013  . Anxiety state 04/14/2013  . Hypokalemia 04/13/2013  . Iron  deficiency anemia 04/13/2013  . Hypertension 04/08/2013  . Chronic back pain 04/08/2013  . Morbid obesity ([place]) 04/08/2013  . Adjustment disorder with mixed anxiety and depressed mood 04/08/2013  .  Lower extremity edema 04/08/2013  . Systolic murmur 48/30/7354  . Chronic constipation 04/08/2013  . Breast cancer, right breast ([place]) 02/21/2013    Creatinine is down to 2 and urine output is up.  Continue full liquids PO and observe urine output and creatinine.  4 Days Post-Op    LOS: 4 days   Matt B. Hassell Done, MD, [facility] [phone] beeper [phone]  11/07/2016 11:08 AM

## 2016-11-07 NOTE — Progress Notes (Signed)
PULMONARY / CRITICAL CARE MEDICINE   Name: Alexandria [name] MRN: [id number] DOB: 22-Jul-1951    ADMISSION DATE:  11/03/2016 CONSULTATION DATE:  11/06/16  REFERRING MD:  Dr. 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  She states she previously was on home O2 after an admission but is not anymore.    Post-operatively, she was noted to have decreased UOP and desaturations.  She was diuresed from 2/17 - 2/18.  The patient was noted to be hypotensive on the evening of 2/18 and was started on D51/2 NS @ 100 ml/hr and given an LR bolus.  Per patient report, she states she does not regularly take her home lasix > only takes as needed pending lower extremity swelling.  She also does not take her lisinopril / BP meds as prescribed.  Admit Sr Cr 1.23 > rose to 3.10 on 2/19.  Of note, she also has had elevated LFT's.  Intra-operative liver exam noted extensive fatty liver / cirrhosis and biopsy was taken given hx of breast cancer with benign findings per Dr. Hassell Done.    PCCM consulted 2/19 for hypotension and decreased UOP.     SUBJECTIVE:  Pain well controlled Has tolerated liquid diet BP and UOP improved as below Awake and comfortable.    VITAL SIGNS: BP 133/67 (BP Location: Left Arm)   Pulse 82   Temp 97.8 F (36.6 C) (Oral)   Resp 14   Ht 5\' 6"  (1.676 m)   Wt 110.9 kg (244 lb 7.8 oz)   LMP 01/30/2005   SpO2 94%   BMI 39.46 kg/m   HEMODYNAMICS:    VENTILATOR SETTINGS:    INTAKE / OUTPUT: I/O last 3 completed shifts: In: 6780.8 [P.O.:1080; I.V.:2700.8; IV Piggyback:3000] Out: 3115 [Urine:3115]  PHYSICAL EXAMINATION: General:  Obese woman, awake,  comfortable, NAD Neuro:  Awake and interacting, follows commands,  HEENT: OP clear, PERRL Cardiovascular:  Regular, no M Lungs: clear B, no wheeze or crackles Abdomen: obese, soft, + BS, mild tenderness diffuse Musculoskeletal: no deformities Skin:  No rash   LABS:  BMET  Recent Labs Lab 11/04/16 0747 11/06/16 0351 11/07/16 0331  NA 136 135 137  K 3.4* 4.5 3.9  CL 102 97* 99*  CO2 27 26 29   BUN 22* 36* 34*  CREATININE 0.87 3.10* 2.09*  GLUCOSE 117* 112* 106*    Electrolytes  Recent Labs Lab 11/04/16 0747 11/06/16 0351 11/07/16 0331  CALCIUM 9.0 9.2 8.6*    CBC  Recent Labs Lab 11/04/16 0747 11/06/16 0351 11/07/16 0331  WBC 8.8 8.1 6.9  HGB 12.7 13.4 12.7  HCT 38.2 41.7 38.5  PLT 148* 131* 140*    Coag's  Recent Labs Lab 11/04/16 0747  INR 1.03    Sepsis Markers No results for input(s): LATICACIDVEN, PROCALCITON, O2SATVEN in the last 168 hours.  ABG No results for input(s): PHART, PCO2ART, PO2ART in the last 168 hours.  Liver Enzymes  Recent Labs Lab 11/04/16 0747 11/06/16 0351 11/07/16 0331  AST 333* 192* 139*  ALT 241* 211* 157*  ALKPHOS 182* 168* 188*  BILITOT 1.4* 1.3* 1.5*  ALBUMIN 3.0* 2.9* 2.6*    Cardiac Enzymes No results  for input(s): TROPONINI, PROBNP in the last 168 hours.  Glucose No results for input(s): GLUCAP in the last 168 hours.  Imaging Dg Chest Port 1 View  Result Date: 11/07/2016 CLINICAL DATA:  Respiratory failure. EXAM: PORTABLE CHEST 1 VIEW COMPARISON:  07/05/2016. FINDINGS: Cardiomegaly with diffuse bilateral pulmonary infiltrates and bilateral pleural effusions. Findings consistent with congestive heart failure. Dense basilar atelectasis. No pneumothorax. IMPRESSION: 1. Congestive heart failure bilateral from interstitial edema bilateral pleural effusions. 2. Dense bibasilar atelectasis. Electronically Signed   By: Marcello Moores  Register   On: 11/07/2016 06:54     STUDIES:     CULTURES:   ANTIBIOTICS:   SIGNIFICANT EVENTS: 2/16  Admit for planned laparoscopic repair of hiatal hernia, nissen fundoplication  [id number]  PCCM consulted for decreased UOP / AKI, hypotension  LINES/TUBES:   DISCUSSION: 66 y/o F admitted for laparoscopic repair of hiatal hernia, nissen fundoplication. Course c/b hypotension and acute renal failure. Improving 2/20  ASSESSMENT / PLAN:  PULMONARY A: Acute Hypoxic Respiratory Failure - in setting of atelectasis, morbid obesity, narcotics; improved Hx Asthma - takes Symbicort "as needed", followed by Dr. Ashok Cordia Hx of PE - remote, VQ in 2014 unremarkable Suspected OSA P:   Mobilize and push IS Minimize sedating meds as able Likely would benefit from CPAP qhs but she does not tolerate. Albuterol available prn  CARDIOVASCULAR A:  Hypotension - suspect volume depletion, narcotics, ongoing antihypertensives (ACE-I) and diuresis; improved P:  Continue to hold lisinopril and b-blocker; consider restart coreg 2/21 depending on BP and status Change IVF to NS @ 29ml/hr  Hold diuretics for now, will likely need to ultimately go back on torsemide qd (this is how she was usually taking at home.)  RENAL A:   AKI - secondary to above; improving  P:   Trend BMP / UOP  Replace electrolytes as indicated   GASTROINTESTINAL A:   S/P Laparoscopic Repair of Hiatal Hernia with Nissen Fundoplication Increased LFT's  S/P Liver Biopsy - noted lesions intraop, see op note; ? Shock liver vs mild hepatitis, ? EtOH Morbid Obesity  P:   Post-op plans per Dr Hassell Done Full liquid diet for now, advance when OK with CCS Following LFT's, improving   HEMATOLOGIC A:   Thrombocytopenia - mild, no evidence of bleeding  P:  Follow CBC  INFECTIOUS A:   No acute infectious process S/P Laparoscopic Repair of Hiatal Hernia  P:   Trend fever, WBC off abx Follow wound  ENDOCRINE A:   Hyperglycemia    P:   Follow glucose on  BMP  NEUROLOGIC A:   Post-operative Pain Chronic Pain  P:   Stopped narcs and she is tolerating Continue escitalopram  Decrease ativan dose 2/10    FAMILY  - Updates: Patient updated on plan of care 2/20 by Dr Lamonte Sakai.   - Inter-disciplinary family meet or Palliative Care meeting due by:  2/26     Baltazar Apo, MD, PhD 11/07/2016, 9:29 AM [facility] [phone] or if no answer [phone]

## 2016-11-08 LAB — COMPREHENSIVE METABOLIC PANEL
ALK PHOS: 205 U/L — AB (ref 38–126)
ALT: 137 U/L — AB (ref 14–54)
AST: 136 U/L — AB (ref 15–41)
Albumin: 2.7 g/dL — ABNORMAL LOW (ref 3.5–5.0)
Anion gap: 7 (ref 5–15)
BUN: 19 mg/dL (ref 6–20)
CALCIUM: 8.9 mg/dL (ref 8.9–10.3)
CO2: 30 mmol/L (ref 22–32)
CREATININE: 0.93 mg/dL (ref 0.44–1.00)
Chloride: 104 mmol/L (ref 101–111)
Glucose, Bld: 94 mg/dL (ref 65–99)
Potassium: 3.4 mmol/L — ABNORMAL LOW (ref 3.5–5.1)
Sodium: 141 mmol/L (ref 135–145)
Total Bilirubin: 1.7 mg/dL — ABNORMAL HIGH (ref 0.3–1.2)
Total Protein: 6.6 g/dL (ref 6.5–8.1)

## 2016-11-08 MED ORDER — CARVEDILOL 3.125 MG PO TABS
3.1250 mg | ORAL_TABLET | Freq: Two times a day (BID) | ORAL | Status: DC
  Administered 2016-11-08 – 2016-11-11 (×6): 3.125 mg via ORAL
  Filled 2016-11-08 (×6): qty 1

## 2016-11-08 NOTE — Progress Notes (Signed)
PULMONARY / CRITICAL CARE MEDICINE   Name: Alexandria [name] MRN: [id number] DOB: [birth year]    ADMISSION DATE:  11/03/2016 CONSULTATION DATE:  11/06/16  REFERRING MD:  Dr. 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  She states she previously was on home O2 after an admission but is not anymore.    Post-operatively, she was noted to have decreased UOP and desaturations.  She was diuresed from 2/17 - 2/18.  The patient was noted to be hypotensive on the evening of 2/18 and was started on D51/2 NS @ 100 ml/hr and given an LR bolus.  Per patient report, she states she does not regularly take her home lasix > only takes as needed pending lower extremity swelling.  She also does not take her lisinopril / BP meds as prescribed.  Admit Sr Cr 1.23 > rose to 3.10 on 2/19.  Of note, she also has had elevated LFT's.  Intra-operative liver exam noted extensive fatty liver / cirrhosis and biopsy was taken given hx of breast cancer with benign findings per Dr. Hassell Done.    PCCM consulted 2/19 for hypotension and decreased UOP.     SUBJECTIVE:  Pt requesting oral pain medications.  No acute events overnight.    VITAL SIGNS: BP (!) 143/69   Pulse 61   Temp 97.7 F (36.5 C) (Oral)   Resp 16   Ht 5\' 6"  (1.676 m)   Wt 244 lb 7.8 oz (110.9 kg)   LMP 01/30/2005   SpO2 93%   BMI 39.46 kg/m   HEMODYNAMICS:    VENTILATOR SETTINGS:    INTAKE / OUTPUT: I/O last 3 completed shifts: In: [id number] [P.O.:630; I.V.:3600] Out: 5480 [Urine:5480]  PHYSICAL EXAMINATION: General:  Chronically ill appearing female in NAD HEENT: MM pink/moist, unable to appreciate jvd Neuro: drowsy  but awakens to voice, oriented CV: s1s2 rrr, no m/r/g PULM: even/non-labored, lungs bilaterally diminished but clear [id number], non-tender, bsx4 active  Extremities: warm/dry, no edema  Skin: no rashes or lesions     LABS:  BMET  Recent Labs Lab 11/06/16 0351 11/07/16 0331 11/08/16 0335  NA 135 137 141  K 4.5 3.9 3.4*  CL 97* 99* 104  CO2 26 29 30   BUN 36* 34* 19  CREATININE 3.10* 2.09* 0.93  GLUCOSE 112* 106* 94    Electrolytes  Recent Labs Lab 11/06/16 0351 11/07/16 0331 11/08/16 0335  CALCIUM 9.2 8.6* 8.9    CBC  Recent Labs Lab 11/04/16 0747 11/06/16 0351 11/07/16 0331  WBC 8.8 8.1 6.9  HGB 12.7 13.4 12.7  HCT 38.2 41.7 38.5  PLT 148* 131* 140*    Coag's  Recent Labs Lab 11/04/16 0747  INR 1.03    Sepsis Markers No results for input(s): LATICACIDVEN, PROCALCITON, O2SATVEN in the last 168 hours.  ABG No results for input(s): PHART, PCO2ART, PO2ART in the last 168 hours.  Liver Enzymes  Recent Labs Lab 11/06/16 0351 11/07/16 0331 11/08/16 0335  AST 192* 139* 136*  ALT 211* 157* 137*  ALKPHOS 168* 188* 205*  BILITOT 1.3* 1.5* 1.7*  ALBUMIN 2.9* 2.6* 2.7*    Cardiac Enzymes No results for input(s): TROPONINI, PROBNP in the  last 168 hours.  Glucose No results for input(s): GLUCAP in the last 168 hours.  Imaging No results found.   STUDIES:    CULTURES:   ANTIBIOTICS:   SIGNIFICANT EVENTS: 2/16  Admit for planned laparoscopic repair of hiatal hernia, nissen fundoplication  [id number]  PCCM consulted for decreased UOP / AKI, hypotension  LINES/TUBES:   DISCUSSION: 66 y/o F admitted for laparoscopic repair of hiatal hernia, nissen fundoplication. Course c/b hypotension and acute renal failure. Improving 2/20  ASSESSMENT / PLAN:  PULMONARY A: Acute Hypoxic Respiratory Failure - in setting of atelectasis, morbid obesity, narcotics; improved Hx Asthma - takes Symbicort "as needed", followed by Dr. Ashok Cordia Hx of PE -  remote, VQ in 2014 unremarkable Suspected OSA P:   Mobilize as able, push IS  Minimize sedating medications Would benefit from CPAP but she is unable to wear Albuterol PRN  Continue singulair  CARDIOVASCULAR A:  Hypotension - suspect volume depletion, narcotics, ongoing antihypertensives (ACE-I) and diuresis; improved P:  NS @ 30 ml/hr  Hold diuretics  Resume Coreg at reduced dose, 3.125 mg BID Does not take her medications at home as ordered > only uses lasix PRN swelling PRN hydralazine for SBP >180   RENAL A:   AKI - secondary to above; improving  P:   Trend BMP / UOP  Replace electrolytes as indicated   GASTROINTESTINAL A:   S/P Laparoscopic Repair of Hiatal Hernia with Nissen Fundoplication Increased LFT's  S/P Liver Biopsy with Metastatic Carcinoma - noted lesions intraop, see op note for image.  Path consistent with metastatic carcinoma likely GYN or breast primary.   Morbid Obesity  P:   At time of documentation, patient is unaware of metastatic carcinoma diagnosis.  Will defer to Dr. Hassell Done to disclose to the patient.  Diet as tolerated  Follow LFT's  HEMATOLOGIC A:   Thrombocytopenia - mild, no evidence of bleeding  P:  Trend CBC  Transfuse per ICU guidelines  INFECTIOUS A:   No acute infectious process S/P Laparoscopic Repair of Hiatal Hernia  P:   Monitor fever curve / WBC Monitor surgical wound  ENDOCRINE A:   Hyperglycemia    P:   Follow glucose on BMP  NEUROLOGIC A:   Post-operative Pain Chronic Pain / Depression  P:   Continue Lexapro  PRN fentanyl for pain - low dose Lorazepam 0.25 mg Q8 PRN   FAMILY  - Updates:  Plan of care discussed with patient 2/21.    - Inter-disciplinary family meet or Palliative Care meeting due by:  2/26  PCCM has asked [facility] to assist with medical care as of 2/22 am.  Please call back if new needs arise.    Noe Gens, NP-C [facility] Pgr: [phone] or if no answer  [phone] 11/08/2016, 1:29 PM  Attending Note:  I have examined patient, reviewed labs, studies and notes. I have discussed the case with B Ollis, and I agree with the data and plans as amended above. 66 year old woman who has a history of breast cancer and underwent a recent Nissen fundoplication. Procedure was complicated by hypovolemia, hypotension, acute renal insufficiency. She has responded very nicely to volume administration and a decrease in her narcotics. Unfortunately during her procedure it was noted that she has significant hepatic abnormality was biopsied. Shows metastatic breast CA. Note that Dr Hassell Done has discussed this result with her, is planning to contact Dr Katheran Awe. We will sign off as her renal fxn and volume status are improved. Will  need to continue to be judicious with narcotics and pain control. Given her lethargy and malignancy, ? Whether she needs brain imaging.   Baltazar Apo, MD, PhD 11/08/2016, 4:56 PM [facility] [phone] or if no answer [phone]

## 2016-11-08 NOTE — Progress Notes (Signed)
Patient ID: Alexandria [name], female   DOB: January 04, 1951, 66 y.o.   MRN: [id number] [facility] Surgery Progress Note:   5 Days Post-Op  Subjective: Mental status is clearer.  I discussed the biopsy of her liver that was done last Friday and reported on today.  This is metastatic breast cancer or ovarian cancer-favor breast since she had breast cancer surgery and RT. Her oncologist is Burney Gauze and I will call him.  Objective: Vital signs in last 24 hours: Temp:  [97.5 F (36.4 C)-98.8 F (37.1 C)] 97.9 F (36.6 C) (02/21 1200) Pulse Rate:  [54-102] 65 (02/21 1600) Resp:  [11-23] 13 (02/21 1600) BP: (103-175)/(49-95) 145/69 (02/21 1600) SpO2:  [92 %-98 %] 92 % (02/21 1600)  Intake/Output from previous day: 02/20 0701 - 02/21 0700 In: 2930 [P.O.:630; I.V.:2300] Out: 2870 [Urine:2870] Intake/Output this shift: Total I/O In: 996 [P.O.:120; I.V.:876] Out: 515 [Urine:515]  Physical Exam: Work of breathing is about the same.  Perhaps the cancer explains the degree of pain that she is having.   Lab Results:  Results for orders placed or performed during the hospital encounter of 11/03/16 (from the past 48 hour(s))  Comprehensive metabolic panel     Status: Abnormal   Collection Time: 11/07/16  3:31 AM  Result Value Ref Range   Sodium 137 135 - 145 mmol/L   Potassium 3.9 3.5 - 5.1 mmol/L   Chloride 99 (L) 101 - 111 mmol/L   CO2 29 22 - 32 mmol/L   Glucose, Bld 106 (H) 65 - 99 mg/dL   BUN 34 (H) 6 - 20 mg/dL   Creatinine, Ser 2.09 (H) 0.44 - 1.00 mg/dL    Comment: DELTA CHECK NOTED REPEATED TO VERIFY    Calcium 8.6 (L) 8.9 - 10.3 mg/dL   Total Protein 6.3 (L) 6.5 - 8.1 g/dL   Albumin 2.6 (L) 3.5 - 5.0 g/dL   AST 139 (H) 15 - 41 U/L   ALT 157 (H) 14 - 54 U/L   Alkaline Phosphatase 188 (H) 38 - 126 U/L   Total Bilirubin 1.5 (H) 0.3 - 1.2 mg/dL   GFR calc non Af Amer 24 (L) >60 mL/min   GFR calc Af Amer 28 (L) >60 mL/min    Comment: (NOTE) The eGFR has been calculated using  the CKD EPI equation. This calculation has not been validated in all clinical situations. eGFR's persistently <60 mL/min signify possible Chronic Kidney Disease.    Anion gap 9 5 - 15  CBC     Status: Abnormal   Collection Time: 11/07/16  3:31 AM  Result Value Ref Range   WBC 6.9 4.0 - 10.5 K/uL   RBC 4.42 3.87 - 5.11 MIL/uL   Hemoglobin 12.7 12.0 - 15.0 g/dL   HCT 38.5 36.0 - 46.0 %   MCV 87.1 78.0 - 100.0 fL   MCH 28.7 26.0 - 34.0 pg   MCHC 33.0 30.0 - 36.0 g/dL   RDW 17.1 (H) 11.5 - 15.5 %   Platelets 140 (L) 150 - 400 K/uL  Comprehensive metabolic panel     Status: Abnormal   Collection Time: 11/08/16  3:35 AM  Result Value Ref Range   Sodium 141 135 - 145 mmol/L   Potassium 3.4 (L) 3.5 - 5.1 mmol/L   Chloride 104 101 - 111 mmol/L   CO2 30 22 - 32 mmol/L   Glucose, Bld 94 65 - 99 mg/dL   BUN 19 6 - 20 mg/dL   Creatinine, Ser  0.93 0.44 - 1.00 mg/dL    Comment: RESULT REPEATED AND VERIFIED DELTA CHECK NOTED    Calcium 8.9 8.9 - 10.3 mg/dL   Total Protein 6.6 6.5 - 8.1 g/dL   Albumin 2.7 (L) 3.5 - 5.0 g/dL   AST 136 (H) 15 - 41 U/L   ALT 137 (H) 14 - 54 U/L   Alkaline Phosphatase 205 (H) 38 - 126 U/L   Total Bilirubin 1.7 (H) 0.3 - 1.2 mg/dL   GFR calc non Af Amer >60 >60 mL/min   GFR calc Af Amer >60 >60 mL/min    Comment: (NOTE) The eGFR has been calculated using the CKD EPI equation. This calculation has not been validated in all clinical situations. eGFR's persistently <60 mL/min signify possible Chronic Kidney Disease.    Anion gap 7 5 - 15    Radiology/Results: Dg Chest Port 1 View  Result Date: 11/07/2016 CLINICAL DATA:  Respiratory failure. EXAM: PORTABLE CHEST 1 VIEW COMPARISON:  07/05/2016. FINDINGS: Cardiomegaly with diffuse bilateral pulmonary infiltrates and bilateral pleural effusions. Findings consistent with congestive heart failure. Dense basilar atelectasis. No pneumothorax. IMPRESSION: 1. Congestive heart failure bilateral from interstitial  edema bilateral pleural effusions. 2. Dense bibasilar atelectasis. Electronically Signed   By: Marcello Moores  Register   On: 11/07/2016 06:54    Anti-infectives: Anti-infectives    Start     Dose/Rate Route Frequency Ordered Stop   11/03/16 0530  cefoTEtan in Dextrose 5% (CEFOTAN) IVPB 2 g     2 g Intravenous On call to O.R. 11/03/16 0530 11/03/16 0751      Assessment/Plan: Problem List: Patient Active Problem List   Diagnosis Date Noted  . Status post laparoscopic Nissen fundoplication 03/49/6116  . Asthma 08/21/2016  . Malignant neoplasm of upper-outer quadrant of female breast ([place])   . Hyperlipidemia 10/23/2014  . Essential hypertension   . Lymphedema 09/07/2014  . Nocturnal hypoxemia 08/18/2014  . Ventral incisional hernia with obstruction 07/05/2014  . Coronary artery calcification 05/22/2014  . Mediastinal lymphadenopathy 01/29/2014  . Hiatal hernia 01/23/2014  . Symptomatic anemia 11/19/2013  . Hx of radiation therapy   . SVT (supraventricular tachycardia) ([place]) 04/19/2013  . Vocal cord dysfunction 04/15/2013  . Acute on chronic diastolic CHF (congestive heart failure) ([place]) 04/14/2013  . Anxiety state 04/14/2013  . Hypokalemia 04/13/2013  . Iron deficiency anemia 04/13/2013  . Hypertension 04/08/2013  . Chronic back pain 04/08/2013  . Morbid obesity ([place]) 04/08/2013  . Adjustment disorder with mixed anxiety and depressed mood 04/08/2013  . Lower extremity edema 04/08/2013  . Systolic murmur 43/53/9122  . Chronic constipation 04/08/2013  . Breast cancer, right breast ([place]) 02/21/2013    Post hiatal hernia repair and Nissen with metastatic breast cancer in the liver.   5 Days Post-Op    LOS: 5 days   Matt B. Hassell Done, MD, [facility] [phone] beeper [phone]  11/08/2016 4:19 PM

## 2016-11-09 LAB — BASIC METABOLIC PANEL
Anion gap: 6 (ref 5–15)
BUN: 13 mg/dL (ref 6–20)
CHLORIDE: 104 mmol/L (ref 101–111)
CO2: 31 mmol/L (ref 22–32)
Calcium: 8.8 mg/dL — ABNORMAL LOW (ref 8.9–10.3)
Creatinine, Ser: 0.88 mg/dL (ref 0.44–1.00)
GFR calc Af Amer: >60 mL/min (ref >60)
GFR calc non Af Amer: >60 mL/min (ref >60)
GLUCOSE: 111 mg/dL — AB (ref 65–99)
POTASSIUM: 3.3 mmol/L — AB (ref 3.5–5.1)
Sodium: 141 mmol/L (ref 135–145)

## 2016-11-09 LAB — CBC
HEMATOCRIT: 39.8 % (ref 36.0–46.0)
Hemoglobin: 12.8 g/dL (ref 12.0–15.0)
MCH: 27.9 pg (ref 26.0–34.0)
MCHC: 32.2 g/dL (ref 30.0–36.0)
MCV: 86.9 fL (ref 78.0–100.0)
Platelets: 148 10*3/uL — ABNORMAL LOW (ref 150–400)
RBC: 4.58 MIL/uL (ref 3.87–5.11)
RDW: 16.5 % — AB (ref 11.5–15.5)
WBC: 5.7 10*3/uL (ref 4.0–10.5)

## 2016-11-09 MED ORDER — POTASSIUM CHLORIDE CRYS ER 20 MEQ PO TBCR
40.0000 meq | EXTENDED_RELEASE_TABLET | Freq: Once | ORAL | Status: AC
  Administered 2016-11-09: 40 meq via ORAL
  Filled 2016-11-09: qty 2

## 2016-11-09 NOTE — Progress Notes (Signed)
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  She states she previously was on home O2 after an admission but is not anymore.    Post-operatively, she was noted to have decreased UOP and desaturations.  She was diuresed from 2/17 - 2/18.  The patient was noted to be hypotensive on the evening of 2/18 and was started on D51/2 NS @ 100 ml/hr and given an LR bolus.  Per patient report, she states she does not regularly take her home lasix > only takes as needed pending lower extremity swelling.  She also does not take her lisinopril / BP meds as prescribed.  Admit Sr Cr 1.23 > rose to 3.10 on 2/19.  Of note, she also has had elevated LFT's.  Intra-operative liver exam noted extensive fatty liver / cirrhosis and biopsy was taken given hx of breast cancer with benign findings per Dr. Hassell Done.        Subjective   Patient seen and examined, denies shortness of breath.   Assessment/Plan:     1. Acute hypoxic respiratory failure- resolved, in the setting of atelectasis, morbid opacity, narcotics. Patient is not requiring oxygen at this time O2 sats 90- 93% on room air. 2. History of asthma- stable, she is followed by Dr. Ashok Cordia as outpatient. Continue albuterol when necessary. 3. Hypokalemia-replace potassium and check BMP in a.m. 4. Acute kidney injury- resolved, back to baseline ,today her creatinine is 0.88. 5. History of depression- continue Lexapro 6. Chronic pain syndrome- continue fentanyl 12.5 every 12 hours  when necessary, Continue lorazepam 0.25 mg every 8 hours when necessary 7. Chronic  thrombocytopenia- platelets stable at 148. Continue to monitor CBC in hospital. Consider holding heparin if platelet count less than 100,000. 8. Status post laparoscopic repair of hiatal hernia with Nissen fundoplication- status post liver biopsy with metastatic carcinoma, likely origin from breast. Dr Marin Olp will be consulted by general surgery.    DVT prophylaxis: Heparin  Code Status: Full code   Procedures:  Laparoscopic repair of hiatal hernia and Nissen fundoplication  Continuous infusions . sodium chloride 10 mL/hr at 11/09/16 0800      Antibiotics:   Anti-infectives    Start     Dose/Rate Route Frequency Ordered Stop   11/03/16 0530  cefoTEtan in Dextrose 5% (CEFOTAN) IVPB 2 g     2 g Intravenous On call to O.R. 11/03/16 0530 11/03/16 0751       Objective   Vitals:   11/09/16 0800 11/09/16 0900 11/09/16 1000 11/09/16 1009  BP: 116/70 115/71 111/64   Pulse: 78 64 63   Resp: 14 11 12    Temp: 97.2 F (36.2 C)     TempSrc: Oral     SpO2: 92% 92% 92% 93%  Weight:      Height:        Intake/Output Summary (Last 24 hours) at 11/09/16 1111 Last data filed at 11/09/16 1000  Gross per 24 hour  Intake              896 ml  Output  700 ml  Net              196 ml   Filed Weights   11/03/16 0527 11/03/16 1327  Weight: 108 kg (238 lb) 110.9 kg (244 lb 7.8 oz)     Physical Examination:  General exam: Appears calm and comfortable. Respiratory system: Clear to auscultation. Respiratory effort normal. Cardiovascular system:  RRR. No  murmurs, rubs, gallops. No pedal edema. GI system: Abdomen is nondistended, soft and Mild generalized abdominal tenderness to palpation. No organomegaly.  Central nervous system. No focal neurological deficits. 5 x 5 power in all extremities. Skin: No rashes, lesions or ulcers. Psychiatry: Alert, oriented x 3.Judgement and insight appear normal. Affect normal.    Data Reviewed: I have personally reviewed following  labs and imaging studies  CBG: No results for input(s): GLUCAP in the last 168 hours.  CBC:  Recent Labs Lab 11/03/16 1401 11/04/16 0747 11/06/16 0351 11/07/16 0331 11/09/16 0353  WBC 9.3 8.8 8.1 6.9 5.7  HGB 12.9 12.7 13.4 12.7 12.8  HCT 39.4 38.2 41.7 38.5 39.8  MCV 85.5 85.3 88.2 87.1 86.9  PLT 166 148* 131* 140* 148*    Basic Metabolic Panel:  Recent Labs Lab 11/04/16 0747 11/06/16 0351 11/07/16 0331 11/08/16 0335 11/09/16 0353  NA 136 135 137 141 141  K 3.4* 4.5 3.9 3.4* 3.3*  CL 102 97* 99* 104 104  CO2 27 26 29 30 31   GLUCOSE 117* 112* 106* 94 111*  BUN 22* 36* 34* 19 13  CREATININE 0.87 3.10* 2.09* 0.93 0.88  CALCIUM 9.0 9.2 8.6* 8.9 8.8*    Recent Results (from the past 240 hour(s))  MRSA PCR Screening     Status: None   Collection Time: 11/03/16  1:11 PM  Result Value Ref Range Status   MRSA by PCR NEGATIVE NEGATIVE Final    Comment:        The GeneXpert MRSA Assay (FDA approved for NASAL specimens only), is one component of a comprehensive MRSA colonization surveillance program. It is not intended to diagnose MRSA infection nor to guide or monitor treatment for MRSA infections.      Liver Function Tests:  Recent Labs Lab 11/04/16 0747 11/06/16 0351 11/07/16 0331 11/08/16 0335  AST 333* 192* 139* 136*  ALT 241* 211* 157* 137*  ALKPHOS 182* 168* 188* 205*  BILITOT 1.4* 1.3* 1.5* 1.7*  PROT 6.7 6.5 6.3* 6.6  ALBUMIN 3.0* 2.9* 2.6* 2.7*   No results for input(s): LIPASE, AMYLASE in the last 168 hours. No results for input(s): AMMONIA in the last 168 hours.  Cardiac Enzymes: No results for input(s): CKTOTAL, CKMB, CKMBINDEX, TROPONINI in the last 168 hours. BNP (last 3 results)  Recent Labs  04/25/16 0921  BNP 88.8    ProBNP (last 3 results) No results for input(s): PROBNP in the last 8760 hours.    Studies: No results found.  Scheduled Meds: . carvedilol  3.125 mg Oral BID WC  . escitalopram  10 mg Oral BID  .  heparin subcutaneous  5,000 Units Subcutaneous Q8H  . montelukast  5 mg Oral QHS      Time spent: 25 min  [facility] Pager [phone]. If 7PM-7AM, please contact night-coverage at [email or web address], Office  [phone]  password [email or web address] 11/09/2016, 11:11 AM  LOS: 6 days

## 2016-11-09 NOTE — Progress Notes (Signed)
Chaplain responding to spiritual care consult.  Pt informed of metastatic liver ca.    Familiar with [facility] from previous admission 4 years prior.   Chaplain provided emotional and spiritual support around recent diagnosis.  Persephonie expressed uncertainty, feeling overwhelmed.  Stated she does not know how to think about moving forward, as she does not know specifics about diagnosis.  Is hopeful to speak with oncology.   She vacillates from speaking about hopes and holding on to dreams that she will travel to stating "I don't want to have to go through this again" and concerns for end of life such as "I have never had to think about burial."    Amybeth wishes to join a church community and states "I know I will probably need that around me."   She has not been a part of a community since moving to [place] from [place] 4 years prior.  Spoke with chaplain about things she values in a church community.    She describes relationship with husband as supportive.  Feels concern for him and relates that she didn't "want to make him go through this again."  In speaking about end of life, stated she is worried for spouse, as "He is not someone who would do well on his own."   Stated she and her husband wanted to travel and speaks about her experiences traveling as a way to identify things that she values.   Kristell's son recently moved to [facility] to be nearer to her.    She has not found extended family supportive and is worried about telling them of diagnosis, as she says "They always bring up things from the past."     Jalanda requested follow up.  Spiritual Care will follow for support during inpatient and forward to cancer center chaplain for support during outpatient encounters.  This chaplain plans to follow up with Manuela Schwartz tomorrow AM.     Creta Levin MDiv

## 2016-11-09 NOTE — Progress Notes (Signed)
Date:  November 09, 2016 Chart reviewed for concurrent status and case management needs. Will continue to follow patient progress. Discharge Planning: following for needs Expected discharge date: [id number] Velva Harman, BSN, [place], [place]

## 2016-11-10 DIAGNOSIS — F4323 Adjustment disorder with mixed anxiety and depressed mood: Secondary | ICD-10-CM

## 2016-11-10 LAB — CBC
HEMATOCRIT: 37.4 % (ref 36.0–46.0)
Hemoglobin: 12.4 g/dL (ref 12.0–15.0)
MCH: 28.4 pg (ref 26.0–34.0)
MCHC: 33.2 g/dL (ref 30.0–36.0)
MCV: 85.8 fL (ref 78.0–100.0)
PLATELETS: 144 10*3/uL — AB (ref 150–400)
RBC: 4.36 MIL/uL (ref 3.87–5.11)
RDW: 16.6 % — ABNORMAL HIGH (ref 11.5–15.5)
WBC: 4.9 10*3/uL (ref 4.0–10.5)

## 2016-11-10 LAB — BASIC METABOLIC PANEL
Anion gap: 6 (ref 5–15)
BUN: 10 mg/dL (ref 6–20)
CHLORIDE: 106 mmol/L (ref 101–111)
CO2: 28 mmol/L (ref 22–32)
CREATININE: 0.73 mg/dL (ref 0.44–1.00)
Calcium: 8.8 mg/dL — ABNORMAL LOW (ref 8.9–10.3)
GFR calc non Af Amer: >60 mL/min (ref >60)
Glucose, Bld: 113 mg/dL — ABNORMAL HIGH (ref 65–99)
Potassium: 3.6 mmol/L (ref 3.5–5.1)
Sodium: 140 mmol/L (ref 135–145)

## 2016-11-10 MED ORDER — OXYCODONE HCL 5 MG PO TABS: 5.0000 mg | ORAL_TABLET | ORAL | 0 refills | Status: AC | PRN

## 2016-11-10 MED ORDER — OXYCODONE HCL 5 MG PO TABS
5.0000 mg | ORAL_TABLET | Freq: Four times a day (QID) | ORAL | Status: DC | PRN
  Administered 2016-11-10 – 2016-11-11 (×4): 10 mg via ORAL
  Filled 2016-11-10 (×4): qty 2

## 2016-11-10 MED ORDER — DOCUSATE SODIUM 100 MG PO CAPS
100.0000 mg | ORAL_CAPSULE | Freq: Every day | ORAL | Status: DC
  Administered 2016-11-11: 100 mg via ORAL
  Filled 2016-11-10: qty 1

## 2016-11-10 MED ORDER — LORAZEPAM 2 MG/ML IJ SOLN
0.5000 mg | Freq: Once | INTRAMUSCULAR | Status: AC
  Administered 2016-11-11: 0.5 mg via INTRAVENOUS
  Filled 2016-11-10: qty 1

## 2016-11-10 MED ORDER — OXYCODONE-ACETAMINOPHEN 5-325 MG PO TABS
1.0000 | ORAL_TABLET | Freq: Four times a day (QID) | ORAL | Status: DC | PRN
  Administered 2016-11-10: 1 via ORAL
  Filled 2016-11-10: qty 1

## 2016-11-10 MED ORDER — DOCUSATE SODIUM 100 MG PO CAPS
100.0000 mg | ORAL_CAPSULE | Freq: Every day | ORAL | 2 refills | Status: DC
Start: 2016-11-11 — End: 2016-12-11

## 2016-11-10 NOTE — Progress Notes (Signed)
MRI head with/without contrast ordered for Saturday, pt is allergic to contrast but can tolerate it with a 13 hour prep per patient.  Pt is also very claustrophobic and requests 1mg  Ativan IVP prior to MRI.  RN left message with Dr. Antonieta Pert office to notify.

## 2016-11-10 NOTE — Progress Notes (Signed)
S: No acute issues. She is looking forward to going home tomorrow after her MRI. She is tolerating full liquids well, no dysphagia or nausea. Does not want purees. Also requesting no acetaminophen given her liver findings.   Vitals, labs, intake/output, and orders reviewed at this time. Afebrile; HR 60's, normotensive, sats mid-90s during our conversation on room air. PO intake ok. UOP adequate. BM recorded on 2/21.   Gen: A&Ox3, no distress.  H&N: EOMI, atraumatic, neck supple Chest: unlabored respirations, RRR Abd: soft, appropriately tender, obese Ext: warm Neuro: grossly normal  Lines/tubes/drains: PIV  A/P:  POD 7 s/p repair of hiatal hernia/nissen fundoplication- she is doing well from the post-op standpoint. Appreciate assistance from [facility] getting her through her post-op acute hypoxic respiratory failure, AKI, and chronic pain.  Newly diagnosed metastatic breast cancer on intraoperative liver biopsies- Dr. Marin Olp following, MRI head tomorrow.  Will plan for discharge home tomorrow.    Romana Juniper, MD [facility], [place] Pager [phone]

## 2016-11-10 NOTE — Discharge Instructions (Signed)
EATING AFTER YOUR NISSEN  ######################################################################  EAT Start with a pureed / full liquid diet (see below) Gradually transition to a high fiber diet with a fiber supplement over the next month after discharge.    WALK Walk an hour a day.  Control your pain to do that.    CONTROL PAIN Control pain so that you can walk, sleep, tolerate sneezing/coughing, go up/down stairs.  HAVE A BOWEL MOVEMENT DAILY Keep your bowels regular to avoid problems.  OK to try a laxative to override constipation.  OK to use an antidairrheal to slow down diarrhea.  Call if not better after 2 tries  CALL IF YOU HAVE PROBLEMS/CONCERNS Call if you are still struggling despite following these instructions. Call if you have concerns not answered by these instructions  ######################################################################   After your esophageal surgery, expect some sticking with swallowing over the next 1-2 months.    If food sticks when you eat, it is called "dysphagia".  This is due to swelling around your esophagus at the wrap & hiatal diaphragm repair.  It will gradually ease off over the next few months.  To help you through this temporary phase, we start you out on a pureed (blenderized) diet.  Your first meal in the hospital was thin liquids.  You should have been given a pureed diet by the time you left the hospital.  We ask patients to stay on a pureed diet for the first 2-3 weeks to avoid anything getting "stuck" near your recent surgery.  Don't be alarmed if your ability to swallow doesn't progress according to this plan.  Everyone is different and some diets can advance more or less quickly.     Some BASIC RULES to follow are:  Maintain an upright position whenever eating or drinking.  Take small bites - just a teaspoon size bite at a time.  Eat slowly.  It may also help to eat only one food at a time.  Consider nibbling through  smaller, more frequent meals & avoid the urge to eat BIG meals  Do not push through feelings of fullness, nausea, or bloatedness  Do not mix solid foods and liquids in the same mouthful  Try not to "wash foods down" with large gulps of liquids.  Avoid carbonated (bubbly/fizzy) drinks.    Avoid foods that make you feel gassy or bloated.  Start with bland foods first.  Wait on trying greasy, fried, or spicy meals until you are tolerating more bland solids well.  Understand that it will be hard to burp and belch at first.  This gradually improves with time.  Expect to be more gassy/flatulent/bloated initially.  Walking will help your body manage it better.  Consider using medications for bloating that contain simethicone such as  Maalox or Gas-X   Eat in a relaxed atmosphere & minimize distractions.  Avoid talking while eating.    Do not use straws.  Following each meal, sit in an upright position (90 degree angle) for 60 to 90 minutes.  Going for a short walk can help as well  If food does stick, don't panic.  Try to relax and let the food pass on its own.  Sipping WARM LIQUID such as strong hot black tea can also help slide it down.   Be gradual in changes & use common sense:  -If you easily tolerating a certain "level" of foods, advance to the next level gradually -If you are having trouble swallowing a particular food, then avoid it.   -  If food is sticking when you advance your diet, go back to thinner previous diet (the lower LEVEL) for 1-2 days.  LEVEL 1 = PUREED DIET  Do for the first 2 WEEKS AFTER SURGERY  -Foods in this group are pureed or blenderized to a smooth, mashed potato-like consistency.  -If necessary, the pureed foods can keep their shape with the addition of a thickening agent.   -Meat should be pureed to a smooth, pasty consistency.  Hot broth or gravy may be added to the pureed meat, approximately 1 oz. of liquid per 3 oz. serving of meat. -CAUTION:  If any  foods do not puree into a smooth consistency, swallowing will be more difficult.  (For example, nuts or seeds sometimes do not blend well.)  Hot Foods Cold Foods  Pureed scrambled eggs and cheese Pureed cottage cheese  Baby cereals Thickened juices and nectars  Thinned cooked cereals (no lumps) Thickened milk or eggnog  Pureed [place] toast or pancakes Ensure  Mashed potatoes Ice cream  Pureed parsley, au gratin, scalloped potatoes, candied sweet potatoes Fruit or [place] ice, sherbet  Pureed buttered or alfredo noodles Plain yogurt  Pureed vegetables (no corn or peas) Instant breakfast  Pureed soups and creamed soups Smooth pudding, mousse, custard  Pureed scalloped apples Whipped gelatin  Gravies Sugar, syrup, honey, jelly  Sauces, cheese, tomato, barbecue, white, creamed Cream  Any baby food Creamer  Alcohol in moderation (not beer or champagne) Margarine  Coffee or tea Mayonnaise   Ketchup, mustard   Apple sauce   SAMPLE MENU:  PUREED DIET Breakfast Lunch Dinner   Orange juice, 1/2 cup  Cream of wheat, 1/2 cup  Pineapple juice, 1/2 cup  Pureed [place], barley soup, 3/4 cup  Pureed Hawaiian chicken, 3 oz   Scrambled eggs, mashed or blended with cheese, 1/2 cup  Tea or coffee, 1 cup   Whole milk, 1 cup   Non-dairy creamer, 2 Tbsp.  Mashed potatoes, 1/2 cup  Pureed cooled broccoli, 1/2 cup  Apple sauce, 1/2 cup  Coffee or tea  Mashed potatoes, 1/2 cup  Pureed spinach, 1/2 cup  Frozen yogurt, 1/2 cup  Tea or coffee      LEVEL 2 = SOFT DIET  After your first 2 weeks, you can advance to a soft diet.   Keep on this diet until everything goes down easily.  Hot Foods Cold Foods  White fish Cottage cheese  Stuffed fish Junior baby fruit  Baby food meals Semi thickened juices  Minced soft cooked, scrambled, poached eggs nectars  Souffle & omelets Ripe mashed bananas  Cooked cereals Canned fruit, pineapple sauce, milk  potatoes Milkshake  Buttered or  Alfredo noodles Custard  Cooked cooled vegetable Puddings, including tapioca  Sherbet Yogurt  Vegetable soup or alphabet soup Fruit ice, [place] ice  Gravies Whipped gelatin  Sugar, syrup, honey, jelly Junior baby desserts  Sauces:  Cheese, creamed, barbecue, tomato, white Cream  Coffee or tea Margarine   SAMPLE MENU:  LEVEL 2 Breakfast Lunch Dinner   Orange juice, 1/2 cup  Oatmeal, 1/2 cup  Scrambled eggs with cheese, 1/2 cup  Decaffeinated tea, 1 cup  Whole milk, 1 cup  Non-dairy creamer, 2 Tbsp  Pineapple juice, 1/2 cup  Minced beef, 3 oz  Gravy, 2 Tbsp  Mashed potatoes, 1/2 cup  Minced fresh broccoli, 1/2 cup  Applesauce, 1/2 cup  Coffee, 1 cup  [place], barley soup, 3/4 cup  Minced Hawaiian chicken, 3 oz  Mashed potatoes, 1/2 cup  Cooked spinach, 1/2 cup  Frozen yogurt, 1/2 cup  Non-dairy creamer, 2 Tbsp      LEVEL 3 = CHOPPED DIET  -After all the foods in level 2 (soft diet) are passing through well you should advance up to more chopped foods.  -It is still important to cut these foods into small pieces and eat slowly.  Hot Foods Cold Foods  Poultry Cottage cheese  Chopped Swedish meatballs Yogurt  Meat salads (ground or flaked meat) Milk  Flaked fish (tuna) Milkshakes  Poached or scrambled eggs Soft, cold, dry cereal  Souffles and omelets Fruit juices or nectars  Cooked cereals Chopped canned fruit  Chopped [place] toast or pancakes Canned fruit cocktail  Noodles or pasta (no rice) Pudding, mousse, custard  Cooked vegetables (no frozen peas, corn, or mixed vegetables) Green salad  Canned small sweet peas Ice cream  Creamed soup or vegetable soup Fruit ice, [place] ice  Pureed vegetable soup or alphabet soup Non-dairy creamer  Ground scalloped apples Margarine  Gravies Mayonnaise  Sauces:  Cheese, creamed, barbecue, tomato, white Ketchup  Coffee or tea Mustard   SAMPLE MENU:  LEVEL 3 Breakfast Lunch Dinner   Orange juice, 1/2  cup  Oatmeal, 1/2 cup  Scrambled eggs with cheese, 1/2 cup  Decaffeinated tea, 1 cup  Whole milk, 1 cup  Non-dairy creamer, 2 Tbsp  Ketchup, 1 Tbsp  Margarine, 1 tsp  Salt, 1/4 tsp  Sugar, 2 tsp  Pineapple juice, 1/2 cup  Ground beef, 3 oz  Gravy, 2 Tbsp  Mashed potatoes, 1/2 cup  Cooked spinach, 1/2 cup  Applesauce, 1/2 cup  Decaffeinated coffee  Whole milk  Non-dairy creamer, 2 Tbsp  Margarine, 1 tsp  Salt, 1/4 tsp  Pureed [place], barley soup, 3/4 cup  Barbecue chicken, 3 oz  Mashed potatoes, 1/2 cup  Ground fresh broccoli, 1/2 cup  Frozen yogurt, 1/2 cup  Decaffeinated tea, 1 cup  Non-dairy creamer, 2 Tbsp  Margarine, 1 tsp  Salt, 1/4 tsp  Sugar, 1 tsp    LEVEL 4:  REGULAR FOODS  -Foods in this group are soft, moist, regularly textured foods.   -This level includes meat and breads, which tend to be the hardest things to swallow.   -Eat very slowly, chew well and continue to avoid carbonated drinks. -most people are at this level in 4-6 weeks  Hot Foods Cold Foods  Baked fish or skinned Soft cheeses - cottage cheese  Souffles and omelets Cream cheese  Eggs Yogurt  Stuffed shells Milk  Spaghetti with meat sauce Milkshakes  Cooked cereal Cold dry cereals (no nuts, dried fruit, coconut)  [place] toast or pancakes Crackers  Buttered toast Fruit juices or nectars  Noodles or pasta (no rice) Canned fruit  Potatoes (all types) Ripe bananas  Soft, cooked vegetables (no corn, lima, or baked beans) Peeled, ripe, fresh fruit  Creamed soups or vegetable soup Cakes (no nuts, dried fruit, coconut)  Canned chicken noodle soup Plain doughnuts  Gravies Ice cream  Bacon dressing Pudding, mousse, custard  Sauces:  Cheese, creamed, barbecue, tomato, white Fruit ice, [place] ice, sherbet  Decaffeinated tea or coffee Whipped gelatin  Pork chops Regular gelatin   Canned fruited gelatin molds   Sugar, syrup, honey, jam, jelly   Cream   Non-dairy    Margarine   Oil   Mayonnaise   Ketchup   Mustard   TROUBLESHOOTING IRREGULAR BOWELS  1) Avoid extremes of bowel movements (no bad constipation/diarrhea)  2) Miralax 17gm mixed in 8oz.  water or juice-daily. May use BID as needed.  3) Gas-x,Phazyme, etc. as needed for gas & bloating.  4) Soft,bland diet. No spicy,greasy,fried foods.  5) Prilosec over-the-counter as needed  6) May hold gluten/wheat products from diet to see if symptoms improve.  7) May try probiotics (Align, Activa, etc) to help calm the bowels down  7) If symptoms become worse call back immediately.    If you have any questions please call our office at [place]: [phone].  LAPAROSCOPIC SURGERY: POST OP INSTRUCTIONS  1. PAIN CONTROL: a. Pain is best controlled by a usual combination of three different methods TOGETHER: i. Ice/Heat ii. Over the counter pain medication iii. Prescription pain medication b. Most patients will experience some swelling and bruising around the incisions.  Ice packs or heating pads (30-60 minutes up to 6 times a day) will help. Use ice for the first few days to help decrease swelling and bruising, then switch to heat to help relax tight/sore spots and speed recovery.  Some people prefer to use ice alone, heat alone, alternating between ice & heat.  Experiment to what works for you.  Swelling and bruising can take several weeks to resolve.   c. A  prescription for pain medication (such as oxycodone, hydrocodone, etc) should be given to you upon discharge.  Take your pain medication as prescribed.  i. If you are having problems/concerns with the prescription medicine (does not control pain, nausea, vomiting, rash, itching, etc), please call us [phone] to see if we need to switch you to a different pain medicine that will work better for you and/or control your side effect better. ii. If you need a refill on your pain medication, please contact your pharmacy.  They will  contact our office to request authorization. Prescriptions will not be filled after 5 pm or on week-ends. 2. Avoid getting constipated.  Between the surgery and the pain medications, it is common to experience some constipation.  Increasing fluid intake and taking a fiber supplement (such as Metamucil, Citrucel, FiberCon, MiraLax, etc) 1-2 times a day regularly will usually help prevent this problem from occurring.  A mild laxative (prune juice, Milk of Magnesia, MiraLax, etc) should be taken according to package directions if there are no bowel movements after 48 hours.   3. Watch out for diarrhea.  If you have many loose bowel movements, simplify your diet to bland foods & liquids for a few days.  Stop any stool softeners and decrease your fiber supplement.  Switching to mild anti-diarrheal medications (Kayopectate, Pepto Bismol) can help.  If this worsens or does not improve, please call us. 4. Wash / shower every day.  You may shower over the dressings as they are waterproof.  Continue to shower over incision(s) after the dressing is off. 5. Remove your waterproof bandages 5 days after surgery.  You may leave the incision open to air.  You may replace a dressing/Band-Aid to cover the incision for comfort if you wish.  6. ACTIVITIES as tolerated:   a. You may resume regular (light) daily activities beginning the next day--such as daily self-care, walking, climbing stairs--gradually increasing activities as tolerated.  If you can walk 30 minutes without difficulty, it is safe to try more intense activity such as jogging, treadmill, bicycling, low-impact aerobics, swimming, etc. b. Save the most intensive and strenuous activity for last such as sit-ups, heavy lifting, contact sports, etc  Refrain from any heavy lifting or straining until you are off narcotics for  pain control.   c. DO NOT PUSH THROUGH PAIN.  Let pain be your guide: If it hurts to do something, don't do it.  Pain is your body warning you to  avoid that activity for another week until the pain goes down. d. You may drive when you are no longer taking prescription pain medication, you can comfortably wear a seatbelt, and you can safely maneuver your car and apply brakes. e. Dennis Bast may have sexual intercourse when it is comfortable.  7. FOLLOW UP in our office a. Please call [facility] at [phone] to set up an appointment to see your surgeon in the office for a follow-up appointment approximately 2-3 weeks after your surgery. b. Make sure that you call for this appointment the day you arrive home to insure a convenient appointment time. 10. IF YOU HAVE DISABILITY OR FAMILY LEAVE FORMS, BRING THEM TO THE OFFICE FOR PROCESSING.  DO NOT GIVE THEM TO YOUR DOCTOR.   WHEN TO CALL us [phone]: 1. Poor pain control 2. Reactions / problems with new medications (rash/itching, nausea, etc)  3. Fever over 101.5 F (38.5 C) 4. Inability to urinate 5. Nausea and/or vomiting 6. Worsening swelling or bruising 7. Continued bleeding from incision. 8. Increased pain, redness, or drainage from the incision   The clinic staff is available to answer your questions during regular business hours (8:30am-5pm).  Please dont hesitate to call and ask to speak to one of our nurses for clinical concerns.   If you have a medical emergency, go to the nearest emergency room or call 911.  A surgeon from [facility] is always on call at the [facility], [place], [place], [place], [place]  [id number] ? MAIN: [phone] ? TOLL FREE: [phone] ?  FAX [phone] [email or web address]

## 2016-11-10 NOTE — Progress Notes (Signed)
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  She states she previously was on home O2 after an admission but is not anymore.    Post-operatively, she was noted to have decreased UOP and desaturations.  She was diuresed from 2/17 - 2/18.  The patient was noted to be hypotensive on the evening of 2/18 and was started on D51/2 NS @ 100 ml/hr and given an LR bolus.  Per patient report, she states she does not regularly take her home lasix > only takes as needed pending lower extremity swelling.  She also does not take her lisinopril / BP meds as prescribed.  Admit Sr Cr 1.23 > rose to 3.10 on 2/19.  Of note, she also has had elevated LFT's.  Intra-operative liver exam noted extensive fatty liver / cirrhosis and biopsy was taken given hx of breast cancer with benign findings per Dr. Hassell Done.        Subjective   Patient seen and examined, denies shortness of breath.Complains of pain not controled with Fentanyl   Assessment/Plan:     1. Acute hypoxic respiratory failure- resolved, in the setting of atelectasis, morbid opacity, narcotics. Patient is not requiring oxygen at this time O2 sats 90- 93% on room air. 2. History of asthma- stable, she is followed by Dr. Ashok Cordia as outpatient. Continue albuterol when necessary. 3. Hypokalemia-replete 4. Acute kidney injury- resolved, back to baseline ,today her creatinine is 0.88. 5. History of depression- continue Lexapro 6. Chronic pain syndrome- will discontinue fentanyl 12.5 and start Percocet 325/5 1-2 tab po q 6 hr prn, Continue lorazepam 0.25 mg every 8 hours  when necessary 7. Chronic thrombocytopenia- platelets stable at 144. Continue to monitor CBC in hospital. Consider holding heparin if platelet count less than 100,000. 8. Status post laparoscopic repair of hiatal hernia with Nissen fundoplication- status post liver biopsy with metastatic carcinoma, likely origin from breast. Dr Marin Olp to follow.    DVT prophylaxis: Heparin  Code Status: Full code   Procedures:  Laparoscopic repair of hiatal hernia and Nissen fundoplication  Continuous infusions . sodium chloride 10 mL/hr at 11/10/16 0800      Antibiotics:   Anti-infectives    Start     Dose/Rate Route Frequency Ordered Stop   11/03/16 0530  cefoTEtan in Dextrose 5% (CEFOTAN) IVPB 2 g     2 g Intravenous On call to O.R. 11/03/16 0530 11/03/16 0751       Objective   Vitals:   11/10/16 0800 11/10/16 0809 11/10/16 1000 11/10/16 1100  BP: (!) 121/103 125/73    Pulse: 67 60 60 61  Resp: 16 10 15 15   Temp:      TempSrc:      SpO2: 92% 93% 96% 92%  Weight:      Height:        Intake/Output Summary (Last 24 hours) at 11/10/16 1150 Last data filed at 11/10/16 0900  Gross per 24 hour  Intake             1320 ml  Output             1000 ml  Net              320 ml   Filed Weights   11/03/16 0527 11/03/16 1327  Weight: 108 kg (238 lb) 110.9 kg (244 lb 7.8 oz)     Physical Examination:  General exam: Appears calm and comfortable. Respiratory system: Clear to auscultation. Respiratory effort normal. Cardiovascular system:  RRR. No  murmurs, rubs, gallops. No pedal edema. GI system: Abdomen is nondistended, soft and Mild generalized abdominal tenderness to palpation. No organomegaly.  Central nervous system. No focal neurological deficits. 5 x 5 power in all extremities. Skin: No rashes, lesions or ulcers. Psychiatry: Alert, oriented x 3.Judgement and insight appear normal. Affect normal.    Data Reviewed: I have personally reviewed following labs and imaging  studies  CBG: No results for input(s): GLUCAP in the last 168 hours.  CBC:  Recent Labs Lab 11/04/16 0747 11/06/16 0351 11/07/16 0331 11/09/16 0353 11/10/16 0345  WBC 8.8 8.1 6.9 5.7 4.9  HGB 12.7 13.4 12.7 12.8 12.4  HCT 38.2 41.7 38.5 39.8 37.4  MCV 85.3 88.2 87.1 86.9 85.8  PLT 148* 131* 140* 148* 144*    Basic Metabolic Panel:  Recent Labs Lab 11/06/16 0351 11/07/16 0331 11/08/16 0335 11/09/16 0353 11/10/16 0345  NA 135 137 141 141 140  K 4.5 3.9 3.4* 3.3* 3.6  CL 97* 99* 104 104 106  CO2 26 29 30 31 28   GLUCOSE 112* 106* 94 111* 113*  BUN 36* 34* 19 13 10   CREATININE 3.10* 2.09* 0.93 0.88 0.73  CALCIUM 9.2 8.6* 8.9 8.8* 8.8*    Recent Results (from the past 240 hour(s))  MRSA PCR Screening     Status: None   Collection Time: 11/03/16  1:11 PM  Result Value Ref Range Status   MRSA by PCR NEGATIVE NEGATIVE Final    Comment:        The GeneXpert MRSA Assay (FDA approved for NASAL specimens only), is one component of a comprehensive MRSA colonization surveillance program. It is not intended to diagnose MRSA infection nor to guide or monitor treatment for MRSA infections.      Liver Function Tests:  Recent Labs Lab 11/04/16 0747 11/06/16 0351 11/07/16 0331 11/08/16 0335  AST 333* 192* 139* 136*  ALT 241* 211* 157* 137*  ALKPHOS 182* 168* 188* 205*  BILITOT 1.4* 1.3* 1.5* 1.7*  PROT 6.7 6.5 6.3* 6.6  ALBUMIN 3.0* 2.9* 2.6* 2.7*   No results for input(s): LIPASE, AMYLASE in the last 168 hours. No results for input(s): AMMONIA in the last 168 hours.  Cardiac Enzymes: No results for input(s): CKTOTAL, CKMB, CKMBINDEX, TROPONINI in the last 168 hours. BNP (last 3 results)  Recent Labs  04/25/16 0921  BNP 88.8    ProBNP (last 3 results) No results for input(s): PROBNP in the last 8760 hours.    Studies: No results found.  Scheduled Meds: . carvedilol  3.125 mg Oral BID WC  . escitalopram  10 mg Oral BID  . heparin subcutaneous   5,000 Units Subcutaneous Q8H  . montelukast  5 mg Oral QHS      Time spent: 25 min  [facility] Pager [phone]. If 7PM-7AM, please contact night-coverage at [email or web address], Office  [phone]  password [email or web address] 11/10/2016, 11:50 AM  LOS: 7 days

## 2016-11-11 ENCOUNTER — Inpatient Hospital Stay (HOSPITAL_COMMUNITY): Payer: Medicare Other

## 2016-11-11 ENCOUNTER — Other Ambulatory Visit: Payer: Self-pay | Admitting: Hematology & Oncology

## 2016-11-11 DIAGNOSIS — C50919 Malignant neoplasm of unspecified site of unspecified female breast: Secondary | ICD-10-CM | POA: Insufficient documentation

## 2016-11-11 DIAGNOSIS — C787 Secondary malignant neoplasm of liver and intrahepatic bile duct: Principal | ICD-10-CM

## 2016-11-11 LAB — CANCER ANTIGEN 27.29: CA 27.29: 730.5 U/mL — AB (ref 0.0–38.6)

## 2016-11-11 MED ORDER — FUROSEMIDE 40 MG PO TABS: 40.0000 mg | ORAL_TABLET | Freq: Two times a day (BID) | ORAL | 2 refills | Status: DC

## 2016-11-11 MED ORDER — POTASSIUM CHLORIDE CRYS ER 20 MEQ PO TBCR: 20.0000 meq | EXTENDED_RELEASE_TABLET | Freq: Every day | ORAL | 1 refills | Status: DC

## 2016-11-11 MED ORDER — CARVEDILOL 3.125 MG PO TABS: 3.1250 mg | ORAL_TABLET | Freq: Two times a day (BID) | ORAL | 2 refills | Status: AC

## 2016-11-11 NOTE — Progress Notes (Signed)
Patient ID: Alexandria [name], female   DOB: Mar 10, 1951, 66 y.o.   MRN: [id number]   8 Days Post-Op   Subjective: No new complaints. Still with some upper abdominal pain but this is stable. Tolerating liquids without difficulty. Going for MRI this morning.  Objective: Vital signs in last 24 hours: Temp:  [97.9 F (36.6 C)-98.4 F (36.9 C)] 98.4 F (36.9 C) (02/24 0600) Pulse Rate:  [59-77] 77 (02/24 0600) Resp:  [14-17] 17 (02/24 0155) BP: (103-133)/(55-82) 112/55 (02/24 0600) SpO2:  [91 %-99 %] 92 % (02/24 0600) Last BM Date: 11/09/16  Intake/Output from previous day: 02/23 0701 - 02/24 0700 In: 980 [P.O.:720; I.V.:260] Out: 1350 [Urine:1350] Intake/Output this shift: No intake/output data recorded.  General appearance: alert, cooperative and no distress Resp: clear to auscultation bilaterally GI: No significant tenderness. Nondistended. Incision/Wound: Clean and dry  Lab Results:   Recent Labs  11/09/16 0353 11/10/16 0345  WBC 5.7 4.9  HGB 12.8 12.4  HCT 39.8 37.4  PLT 148* 144*   BMET  Recent Labs  11/09/16 0353 11/10/16 0345  NA 141 140  K 3.3* 3.6  CL 104 106  CO2 31 28  GLUCOSE 111* 113*  BUN 13 10  CREATININE 0.88 0.73  CALCIUM 8.8* 8.8*     Studies/Results: No results found.  Anti-infectives: Anti-infectives    Start     Dose/Rate Route Frequency Ordered Stop   11/03/16 0530  cefoTEtan in Dextrose 5% (CEFOTAN) IVPB 2 g     2 g Intravenous On call to O.R. 11/03/16 0530 11/03/16 0751      Assessment/Plan: s/p Procedure(s): LAPAROSCOPIC NISSEN AND  REPAIR OF HIATAL HERNIA, Postop course complicated by acute renal insufficiency and hypoxia which is resolved Newly diagnosed metastatic breast cancer to liver followed by Dr. Marin Olp after discharge. Okay for discharge today as planned.   LOS: 8 days    Issaih Kaus T 11/11/2016

## 2016-11-11 NOTE — Progress Notes (Signed)
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  She states she previously was on home O2 after an admission but is not anymore.    Post-operatively, she was noted to have decreased UOP and desaturations.  She was diuresed from 2/17 - 2/18.  The patient was noted to be hypotensive on the evening of 2/18 and was started on D51/2 NS @ 100 ml/hr and given an LR bolus.  Per patient report, she states she does not regularly take her home lasix > only takes as needed pending lower extremity swelling.  She also does not take her lisinopril / BP meds as prescribed.  Admit Sr Cr 1.23 > rose to 3.10 on 2/19.  Of note, she also has had elevated LFT's.  Intra-operative liver exam noted extensive fatty liver / cirrhosis and biopsy was taken given hx of breast cancer with benign findings per Dr. Hassell Done.        Subjective   Patient seen and examined, denies shortness of breath.Complains of pain not controled with Fentanyl   Assessment/Plan:     1. Acute hypoxic respiratory failure- resolved, in the setting of atelectasis, morbid opacity, narcotics. Patient is not requiring oxygen at this time O2 sats 90- 93% on room air. 2. Hypotension- resolved,  dose of coreg has been changed to 3.125 mg po BID 3. History of asthma- stable, she is followed by Dr. Ashok Cordia as outpatient. Continue albuterol when necessary. 4. H/o CHF- Patient takes Lasix 80 mg po bid for CHF,  And was only taking 80 mg daily with additional dose only if needed.She became hypotensive in the hospital, so Lasix was held. Will cut down the dose of Lasix to 40  mg po BID. She follows Cardiology Dr Gwenlyn Found as outpatient. 5. Hypokalemia-replete 6. Acute kidney injury- resolved, back to baseline ,today her creatinine is 0.73. 7. History of depression- continue Lexapro 8. Chronic pain syndrome- will discontinue fentanyl 12.5 and started Oxycodone 5 mg 1-2 tabs q 4 hr prn , Continue lorazepam 0.25 mg every 8 hours when necessary 9. Chronic thrombocytopenia- platelets stable at 144. Continue to monitor CBC in hospital. Consider holding heparin if platelet count less than 100,000. 10. Status post laparoscopic repair of hiatal hernia with Nissen fundoplication- status post liver biopsy with metastatic carcinoma, likely origin from breast. Dr Marin Olp to follow.  I have update the discharge medications and she can follow up  PCP and Cardiology as outpatient for further recommendations. Will sign off. Thanks   DVT prophylaxis: Heparin  Code Status: Full code   Procedures:  Laparoscopic repair of hiatal hernia and Nissen fundoplication  Continuous infusions . sodium chloride 10 mL/hr at 11/11/16 0400      Antibiotics:   Anti-infectives    Start     Dose/Rate Route Frequency Ordered Stop   11/03/16 0530  cefoTEtan in Dextrose 5% (CEFOTAN) IVPB 2 g     2 g Intravenous On call to O.R. 11/03/16 0530 11/03/16 0751       Objective   Vitals:   11/10/16 2223 11/10/16 2318 11/11/16 0155 11/11/16 0600  BP: 124/71  133/65 (!) 112/55  Pulse:  67   77  Resp: 15  17   Temp:  98.4 F (36.9 C)  98.4 F (36.9 C)  TempSrc:  Oral  Oral  SpO2: 95%  95% 92%  Weight:      Height:        Intake/Output Summary (Last 24 hours) at 11/11/16 0823 Last data filed at 11/11/16 0600  Gross per 24 hour  Intake              700 ml  Output             1350 ml  Net             -650 ml   Filed Weights   11/03/16 0527 11/03/16 1327  Weight: 108 kg (238 lb) 110.9 kg (244 lb 7.8 oz)     Physical Examination:  General exam: Appears calm and  comfortable. Respiratory system: Clear to auscultation. Respiratory effort normal. Cardiovascular system:  RRR. No  murmurs, rubs, gallops. No pedal edema. GI system: Abdomen is nondistended, soft and Mild generalized abdominal tenderness to palpation. No organomegaly.  Central nervous system. No focal neurological deficits. 5 x 5 power in all extremities. Skin: No rashes, lesions or ulcers. Psychiatry: Alert, oriented x 3.Judgement and insight appear normal. Affect normal.    Data Reviewed: I have personally reviewed following labs and imaging studies  CBG: No results for input(s): GLUCAP in the last 168 hours.  CBC:  Recent Labs Lab 11/06/16 0351 11/07/16 0331 11/09/16 0353 11/10/16 0345  WBC 8.1 6.9 5.7 4.9  HGB 13.4 12.7 12.8 12.4  HCT 41.7 38.5 39.8 37.4  MCV 88.2 87.1 86.9 85.8  PLT 131* 140* 148* 144*    Basic Metabolic Panel:  Recent Labs Lab 11/06/16 0351 11/07/16 0331 11/08/16 0335 11/09/16 0353 11/10/16 0345  NA 135 137 141 141 140  K 4.5 3.9 3.4* 3.3* 3.6  CL 97* 99* 104 104 106  CO2 26 29 30 31 28   GLUCOSE 112* 106* 94 111* 113*  BUN 36* 34* 19 13 10   CREATININE 3.10* 2.09* 0.93 0.88 0.73  CALCIUM 9.2 8.6* 8.9 8.8* 8.8*    Recent Results (from the past 240 hour(s))  MRSA PCR Screening     Status: None   Collection Time: 11/03/16  1:11 PM  Result Value Ref Range Status   MRSA by PCR NEGATIVE NEGATIVE Final    Comment:        The GeneXpert MRSA Assay (FDA approved for NASAL specimens only), is one component of a comprehensive MRSA colonization surveillance program. It is not intended to diagnose MRSA infection nor to guide or monitor treatment for MRSA infections.      Liver Function Tests:  Recent Labs Lab 11/06/16 0351 11/07/16 0331 11/08/16 0335  AST 192* 139* 136*  ALT 211* 157* 137*  ALKPHOS 168* 188* 205*  BILITOT 1.3* 1.5* 1.7*  PROT 6.5 6.3* 6.6  ALBUMIN 2.9* 2.6* 2.7*   No results for input(s): LIPASE, AMYLASE in the  last 168 hours. No results for input(s): AMMONIA in the last 168 hours.  Cardiac Enzymes: No results for input(s): CKTOTAL, CKMB, CKMBINDEX, TROPONINI in the last 168 hours. BNP (last 3 results)  Recent Labs  04/25/16 0921  BNP 88.8    ProBNP (last 3 results) No results for input(s): PROBNP in the last 8760 hours.    Studies: No results found.  Scheduled Meds: . carvedilol  3.125 mg Oral BID WC  . docusate sodium  100 mg Oral  Daily  . escitalopram  10 mg Oral BID  . heparin subcutaneous  5,000 Units Subcutaneous Q8H  . montelukast  5 mg Oral QHS      Time spent: 25 min  [facility] Pager [phone]. If 7PM-7AM, please contact night-coverage at [email or web address], Office  [phone]  password [email or web address] 11/11/2016, 8:23 AM  LOS: 8 days

## 2016-11-11 NOTE — Progress Notes (Signed)
Patient in MRI during morning vital signs.

## 2016-11-11 NOTE — Progress Notes (Signed)
Discharge instructions reviewed with patient and husband. No questions at this time. Patient discharged to home.

## 2016-11-15 ENCOUNTER — Encounter (HOSPITAL_COMMUNITY)
Admission: RE | Admit: 2016-11-15 | Discharge: 2016-11-15 | Disposition: A | Discharge status: Home / Self Care | Payer: Medicare Other | Source: Ambulatory Visit | Attending: Hematology & Oncology | Admitting: Hematology & Oncology

## 2016-11-15 DIAGNOSIS — C50919 Malignant neoplasm of unspecified site of unspecified female breast: Secondary | ICD-10-CM | POA: Insufficient documentation

## 2016-11-15 DIAGNOSIS — C787 Secondary malignant neoplasm of liver and intrahepatic bile duct: Secondary | ICD-10-CM | POA: Insufficient documentation

## 2016-11-15 DIAGNOSIS — C50911 Malignant neoplasm of unspecified site of right female breast: Secondary | ICD-10-CM | POA: Diagnosis not present

## 2016-11-15 LAB — GLUCOSE, CAPILLARY: GLUCOSE-CAPILLARY: 108 mg/dL — AB (ref 65–99)

## 2016-11-15 MED ORDER — FLUDEOXYGLUCOSE F - 18 (FDG) INJECTION
10.1400 | Freq: Once | INTRAVENOUS | Status: AC | PRN
  Administered 2016-11-15: 10.14 via INTRAVENOUS

## 2016-11-22 ENCOUNTER — Telehealth: Payer: Self-pay | Admitting: *Deleted

## 2016-11-22 ENCOUNTER — Telehealth: Payer: Self-pay | Admitting: Internal Medicine

## 2016-11-22 NOTE — Telephone Encounter (Signed)
Husband states pt is having difficulty recovering from her surgery from Feb 16.  He would like Dr Raliegh Ip to to initiate an order for pt to go in to a skilled nursing facility.  Husband states pt is not eating.  Also Mr Stringfellow would like Dr Raliegh Ip to review the nmpet image initial ei scull base to thigh done by her oncologist and give him a call after that.Marland Kitchen

## 2016-11-22 NOTE — Telephone Encounter (Signed)
Patient's husband with concern regarding getting patient to her appointment on Friday. He is wanting to know of suggestions. Reviewed Non Emergent EMS transport but explained that insurance may not reimburse. He is going to call his insurance to see if they have suggestions on how to arrange this.   If he can't arrange transport, he wants Dr Marin Olp to call him in place of the appointment. Dr Marin Olp states he cannot do a phone appointment but he can call him with the PET scan results. Husband is aware. He would like Dr Marin Olp to schedule a time to talk, explained to him that the possibility was unlikely. He will call back tomorrow with whether or not patient can come to meeting and discuss phone call timing.

## 2016-11-22 NOTE — Discharge Summary (Signed)
Physician Discharge Summary  Patient ID: Alexandria [name] MRN: [id number] DOB/AGE: [birth year] 64 y.o.  Admit date: 11/03/2016 Discharge date: 11/11/16  Admission Diagnoses:  Morbid obesity and hiatal hernia with GERD  Discharge Diagnoses:  Metastatic breast cancer to liver  Principal Problem:   Status post laparoscopic Nissen fundoplication Active Problems:   Morbid obesity (Alexandria [name])   Adjustment disorder with mixed anxiety and depressed mood   Surgery:  Laparoscopy with liver biopsy; Nissen fundoplication  Discharged Condition: stable  Hospital Course:   Had surgery and at the time of surgery, frozen section of liver biopsy benign.  Lap Nissen performed.  Over the weekend patient became volume depleted with decline in renal function.  Fluid resuscitation corrected.  Path comes back metastatic breast cancer.  Patient stable for discharge by my partners   Consults: critical care medicine; [facility]  Significant Diagnostic Studies: path showing metastatic breast cancer (see photos in op note)    Discharge Exam: Blood pressure 125/74, pulse 75, temperature 98.1 F (36.7 C), temperature source Oral, resp. rate 16, height 5\' 6"  (1.676 m), weight 110.9 kg (244 lb 7.8 oz), last menstrual period 01/30/2005, SpO2 93 %. I did not examine at the time of discharge  Disposition: 01-Home or Self Care   Allergies as of 11/11/2016      Reactions   Contrast Media [iodinated Diagnostic Agents] Anaphylaxis   Solu-medrol [methylprednisolone Acetate] Other (See Comments)   Pt. States she is not allergic, just can't tolerate medication well.    Albuterol Other (See Comments)   Anxious    Prednisone Other (See Comments)   Pt gets very agitated when she takes high doses of steroids      Medication List    STOP taking these medications   cyclobenzaprine 10 MG tablet Commonly known as:  FLEXERIL   diphenhydrAMINE 25 MG tablet Commonly known as:  BENADRYL   oxyCODONE-acetaminophen 10-325  MG tablet Commonly known as:  PERCOCET     TAKE these medications   albuterol 108 (90 Base) MCG/ACT inhaler Commonly known as:  PROVENTIL HFA;VENTOLIN HFA Inhale 2 puffs into the lungs every 6 (six) hours as needed for wheezing or shortness of breath. Notes to patient:  As needed not given in hospital.   anastrozole 1 MG tablet Commonly known as:  ARIMIDEX TAKE 1 TABLET(1 MG) BY MOUTH DAILY Notes to patient:  Resume regular schedule this medication was not given in hospital    atorvastatin 20 MG tablet Commonly known as:  LIPITOR Take 1 tablet (20 mg total) by mouth daily. Notes to patient:  Resume regular schedule this medication was not given in hospital    carvedilol 3.125 MG tablet Commonly known as:  COREG Take 1 tablet (3.125 mg total) by mouth 2 (two) times daily with a meal. What changed:  medication strength  how much to take   DSS 100 MG Caps Take 100 mg by mouth 2 (two) times daily. What changed:  when to take this  reasons to take this   docusate sodium 100 MG capsule Commonly known as:  COLACE Take 1 capsule (100 mg total) by mouth daily. What changed:  You were already taking a medication with the same name, and this prescription was added. Make sure you understand how and when to take each.   EMBEDA 50-2 MG Cpcr Generic drug:  Morphine-Naltrexone Take 1 capsule by mouth daily. Notes to patient:  This medication was not given in the hospital    escitalopram 10 MG tablet Commonly  known as:  LEXAPRO TAKE 1 TABLET BY MOUTH TWICE DAILY   furosemide 40 MG tablet Commonly known as:  LASIX Take 1 tablet (40 mg total) by mouth 2 (two) times daily. What changed:  medication strength  how much to take  when to take this   HAIR/SKIN/NAILS PO Take 1 tablet by mouth 3 (three) times daily. Notes to patient:  This medication not given while in hospital    lidocaine 5 % Commonly known as:  [place] 1 patch onto the skin every 12 (twelve) hours.  Remove & Discard patch within 12 hours or as directed by MD Notes to patient:  This medication was not given while in hospital    lisinopril 5 MG tablet Commonly known as:  PRINIVIL,ZESTRIL Take 1 tablet (5 mg total) by mouth daily. Notes to patient:  Resume as prescribe    LORazepam 0.5 MG tablet Commonly known as:  ATIVAN Take 1 tablet (0.5 mg total) by mouth every 8 (eight) hours as needed. for anxiety What changed:  when to take this  additional instructions Notes to patient:  Not given in the hospital    lubiprostone 24 MCG capsule Commonly known as:  AMITIZA Take 1 capsule (24 mcg total) by mouth 2 (two) times daily with a meal. Notes to patient:  Not given in hospital    metolazone 2.5 MG tablet Commonly known as:  ZAROXOLYN Take 1 tablet (2.5 mg total) by mouth daily as needed (fluid gain). Notes to patient:  Not given while in hospital    montelukast 5 MG chewable tablet Commonly known as:  SINGULAIR Chew 1 tablet (5 mg total) by mouth at bedtime. Notes to patient:  Resume as prescribed    multivitamin with minerals Tabs tablet Take 1 tablet by mouth daily. Notes to patient:  Resume as prescribe not given in hospital    potassium chloride SA 20 MEQ tablet Commonly known as:  K-DUR,KLOR-CON Take 1 tablet (20 mEq total) by mouth daily. What changed:  when to take this Notes to patient:  Take as prescribed    ranitidine 150 MG tablet Commonly known as:  ZANTAC Take 1 tablet (150 mg total) by mouth at bedtime. What changed:  when to take this Notes to patient:  Not given in hospital      ASK your doctor about these medications   oxyCODONE 5 MG immediate release tablet Commonly known as:  ROXICODONE Take 1-2 tablets (5-10 mg total) by mouth every 4 (four) hours as needed for severe pain. Ask about: Should I take this medication?      Follow-up Information    Jacson Rapaport B, MD. Schedule an appointment as soon as possible for a visit in 2 week(s).    Specialty:  General Surgery Contact information: [address] [place]  [address] [phone]        Sherald Hess, MD .   Specialty:  Pediatrics Contact information: [address] [place] [address] [place] [address] [phone]           Signed: Pedro Earls 11/22/2016, 7:38 AM

## 2016-11-23 ENCOUNTER — Encounter (HOSPITAL_COMMUNITY): Payer: Self-pay | Admitting: Radiology

## 2016-11-23 ENCOUNTER — Ambulatory Visit: Payer: Medicare Other | Admitting: Podiatry

## 2016-11-23 ENCOUNTER — Emergency Department (HOSPITAL_COMMUNITY): Payer: Medicare Other

## 2016-11-23 ENCOUNTER — Encounter (HOSPITAL_COMMUNITY)
Admission: EM | Disposition: A | Discharge status: Skilled Nursing Facility | Payer: Self-pay | Source: Home / Self Care | Attending: Pulmonary Disease

## 2016-11-23 ENCOUNTER — Inpatient Hospital Stay (HOSPITAL_COMMUNITY): Payer: Medicare Other | Admitting: Anesthesiology

## 2016-11-23 ENCOUNTER — Telehealth: Payer: Self-pay | Admitting: Internal Medicine

## 2016-11-23 ENCOUNTER — Inpatient Hospital Stay (HOSPITAL_COMMUNITY): Payer: Medicare Other

## 2016-11-23 ENCOUNTER — Inpatient Hospital Stay (HOSPITAL_COMMUNITY)
Admission: EM | Admit: 2016-11-23 | Discharge: 2016-12-11 | DRG: 377 | Disposition: A | Discharge status: Skilled Nursing Facility | Payer: Medicare Other | Attending: Internal Medicine | Admitting: Internal Medicine

## 2016-11-23 DIAGNOSIS — K259 Gastric ulcer, unspecified as acute or chronic, without hemorrhage or perforation: Secondary | ICD-10-CM | POA: Diagnosis not present

## 2016-11-23 DIAGNOSIS — G9341 Metabolic encephalopathy: Secondary | ICD-10-CM | POA: Diagnosis not present

## 2016-11-23 DIAGNOSIS — D62 Acute posthemorrhagic anemia: Secondary | ICD-10-CM

## 2016-11-23 DIAGNOSIS — Z825 Family history of asthma and other chronic lower respiratory diseases: Secondary | ICD-10-CM

## 2016-11-23 DIAGNOSIS — Z515 Encounter for palliative care: Secondary | ICD-10-CM | POA: Diagnosis present

## 2016-11-23 DIAGNOSIS — I10 Essential (primary) hypertension: Secondary | ICD-10-CM | POA: Diagnosis present

## 2016-11-23 DIAGNOSIS — K250 Acute gastric ulcer with hemorrhage: Secondary | ICD-10-CM | POA: Diagnosis not present

## 2016-11-23 DIAGNOSIS — R5381 Other malaise: Secondary | ICD-10-CM | POA: Diagnosis not present

## 2016-11-23 DIAGNOSIS — I5033 Acute on chronic diastolic (congestive) heart failure: Secondary | ICD-10-CM | POA: Diagnosis not present

## 2016-11-23 DIAGNOSIS — R571 Hypovolemic shock: Secondary | ICD-10-CM | POA: Diagnosis not present

## 2016-11-23 DIAGNOSIS — K7200 Acute and subacute hepatic failure without coma: Secondary | ICD-10-CM | POA: Diagnosis not present

## 2016-11-23 DIAGNOSIS — I110 Hypertensive heart disease with heart failure: Secondary | ICD-10-CM | POA: Diagnosis present

## 2016-11-23 DIAGNOSIS — D72829 Elevated white blood cell count, unspecified: Secondary | ICD-10-CM | POA: Diagnosis not present

## 2016-11-23 DIAGNOSIS — I5043 Acute on chronic combined systolic (congestive) and diastolic (congestive) heart failure: Secondary | ICD-10-CM | POA: Diagnosis not present

## 2016-11-23 DIAGNOSIS — G893 Neoplasm related pain (acute) (chronic): Secondary | ICD-10-CM | POA: Diagnosis present

## 2016-11-23 DIAGNOSIS — C50419 Malignant neoplasm of upper-outer quadrant of unspecified female breast: Secondary | ICD-10-CM | POA: Diagnosis not present

## 2016-11-23 DIAGNOSIS — K264 Chronic or unspecified duodenal ulcer with hemorrhage: Secondary | ICD-10-CM

## 2016-11-23 DIAGNOSIS — Z9049 Acquired absence of other specified parts of digestive tract: Secondary | ICD-10-CM

## 2016-11-23 DIAGNOSIS — D696 Thrombocytopenia, unspecified: Secondary | ICD-10-CM | POA: Diagnosis present

## 2016-11-23 DIAGNOSIS — R1110 Vomiting, unspecified: Secondary | ICD-10-CM | POA: Diagnosis not present

## 2016-11-23 DIAGNOSIS — Z6841 Body Mass Index (BMI) 40.0 and over, adult: Secondary | ICD-10-CM

## 2016-11-23 DIAGNOSIS — N179 Acute kidney failure, unspecified: Secondary | ICD-10-CM | POA: Diagnosis not present

## 2016-11-23 DIAGNOSIS — D689 Coagulation defect, unspecified: Secondary | ICD-10-CM | POA: Diagnosis not present

## 2016-11-23 DIAGNOSIS — R2689 Other abnormalities of gait and mobility: Secondary | ICD-10-CM | POA: Diagnosis not present

## 2016-11-23 DIAGNOSIS — I517 Cardiomegaly: Secondary | ICD-10-CM | POA: Diagnosis not present

## 2016-11-23 DIAGNOSIS — Z170 Estrogen receptor positive status [ER+]: Secondary | ICD-10-CM | POA: Diagnosis not present

## 2016-11-23 DIAGNOSIS — R748 Abnormal levels of other serum enzymes: Secondary | ICD-10-CM | POA: Diagnosis not present

## 2016-11-23 DIAGNOSIS — Z01818 Encounter for other preprocedural examination: Secondary | ICD-10-CM | POA: Diagnosis not present

## 2016-11-23 DIAGNOSIS — J9601 Acute respiratory failure with hypoxia: Secondary | ICD-10-CM

## 2016-11-23 DIAGNOSIS — C7951 Secondary malignant neoplasm of bone: Secondary | ICD-10-CM | POA: Diagnosis not present

## 2016-11-23 DIAGNOSIS — F411 Generalized anxiety disorder: Secondary | ICD-10-CM | POA: Diagnosis present

## 2016-11-23 DIAGNOSIS — J9600 Acute respiratory failure, unspecified whether with hypoxia or hypercapnia: Secondary | ICD-10-CM | POA: Diagnosis not present

## 2016-11-23 DIAGNOSIS — E876 Hypokalemia: Secondary | ICD-10-CM | POA: Diagnosis present

## 2016-11-23 DIAGNOSIS — R4182 Altered mental status, unspecified: Secondary | ICD-10-CM | POA: Diagnosis not present

## 2016-11-23 DIAGNOSIS — R578 Other shock: Secondary | ICD-10-CM | POA: Diagnosis present

## 2016-11-23 DIAGNOSIS — B3741 Candidal cystitis and urethritis: Secondary | ICD-10-CM | POA: Diagnosis not present

## 2016-11-23 DIAGNOSIS — Z8249 Family history of ischemic heart disease and other diseases of the circulatory system: Secondary | ICD-10-CM

## 2016-11-23 DIAGNOSIS — Z9221 Personal history of antineoplastic chemotherapy: Secondary | ICD-10-CM | POA: Diagnosis not present

## 2016-11-23 DIAGNOSIS — K219 Gastro-esophageal reflux disease without esophagitis: Secondary | ICD-10-CM | POA: Diagnosis not present

## 2016-11-23 DIAGNOSIS — T8119XA Other postprocedural shock, initial encounter: Secondary | ICD-10-CM

## 2016-11-23 DIAGNOSIS — J9811 Atelectasis: Secondary | ICD-10-CM

## 2016-11-23 DIAGNOSIS — K922 Gastrointestinal hemorrhage, unspecified: Secondary | ICD-10-CM | POA: Diagnosis present

## 2016-11-23 DIAGNOSIS — K2970 Gastritis, unspecified, without bleeding: Secondary | ICD-10-CM | POA: Diagnosis not present

## 2016-11-23 DIAGNOSIS — M797 Fibromyalgia: Secondary | ICD-10-CM | POA: Diagnosis present

## 2016-11-23 DIAGNOSIS — F339 Major depressive disorder, recurrent, unspecified: Secondary | ICD-10-CM | POA: Diagnosis present

## 2016-11-23 DIAGNOSIS — R0600 Dyspnea, unspecified: Secondary | ICD-10-CM | POA: Diagnosis not present

## 2016-11-23 DIAGNOSIS — K920 Hematemesis: Secondary | ICD-10-CM | POA: Diagnosis not present

## 2016-11-23 DIAGNOSIS — Z4682 Encounter for fitting and adjustment of non-vascular catheter: Secondary | ICD-10-CM | POA: Diagnosis not present

## 2016-11-23 DIAGNOSIS — C50919 Malignant neoplasm of unspecified site of unspecified female breast: Secondary | ICD-10-CM

## 2016-11-23 DIAGNOSIS — Z90710 Acquired absence of both cervix and uterus: Secondary | ICD-10-CM

## 2016-11-23 DIAGNOSIS — R21 Rash and other nonspecific skin eruption: Secondary | ICD-10-CM | POA: Diagnosis not present

## 2016-11-23 DIAGNOSIS — Z0189 Encounter for other specified special examinations: Secondary | ICD-10-CM | POA: Diagnosis not present

## 2016-11-23 DIAGNOSIS — C787 Secondary malignant neoplasm of liver and intrahepatic bile duct: Secondary | ICD-10-CM | POA: Diagnosis not present

## 2016-11-23 DIAGNOSIS — M6281 Muscle weakness (generalized): Secondary | ICD-10-CM | POA: Diagnosis not present

## 2016-11-23 DIAGNOSIS — Z923 Personal history of irradiation: Secondary | ICD-10-CM

## 2016-11-23 DIAGNOSIS — K228 Other specified diseases of esophagus: Secondary | ICD-10-CM | POA: Diagnosis not present

## 2016-11-23 DIAGNOSIS — E870 Hyperosmolality and hypernatremia: Secondary | ICD-10-CM | POA: Diagnosis not present

## 2016-11-23 DIAGNOSIS — K7460 Unspecified cirrhosis of liver: Secondary | ICD-10-CM | POA: Diagnosis present

## 2016-11-23 DIAGNOSIS — C50411 Malignant neoplasm of upper-outer quadrant of right female breast: Secondary | ICD-10-CM | POA: Diagnosis present

## 2016-11-23 DIAGNOSIS — K7210 Chronic hepatic failure without coma: Secondary | ICD-10-CM | POA: Diagnosis present

## 2016-11-23 DIAGNOSIS — I2510 Atherosclerotic heart disease of native coronary artery without angina pectoris: Secondary | ICD-10-CM | POA: Diagnosis present

## 2016-11-23 DIAGNOSIS — E878 Other disorders of electrolyte and fluid balance, not elsewhere classified: Secondary | ICD-10-CM

## 2016-11-23 DIAGNOSIS — G9340 Encephalopathy, unspecified: Secondary | ICD-10-CM | POA: Diagnosis not present

## 2016-11-23 DIAGNOSIS — K256 Chronic or unspecified gastric ulcer with both hemorrhage and perforation: Principal | ICD-10-CM | POA: Diagnosis present

## 2016-11-23 DIAGNOSIS — Z79811 Long term (current) use of aromatase inhibitors: Secondary | ICD-10-CM

## 2016-11-23 DIAGNOSIS — D509 Iron deficiency anemia, unspecified: Secondary | ICD-10-CM | POA: Diagnosis present

## 2016-11-23 DIAGNOSIS — I509 Heart failure, unspecified: Secondary | ICD-10-CM | POA: Diagnosis not present

## 2016-11-23 DIAGNOSIS — R918 Other nonspecific abnormal finding of lung field: Secondary | ICD-10-CM | POA: Diagnosis not present

## 2016-11-23 DIAGNOSIS — M7989 Other specified soft tissue disorders: Secondary | ICD-10-CM | POA: Diagnosis not present

## 2016-11-23 DIAGNOSIS — J9690 Respiratory failure, unspecified, unspecified whether with hypoxia or hypercapnia: Secondary | ICD-10-CM | POA: Diagnosis not present

## 2016-11-23 DIAGNOSIS — Z79899 Other long term (current) drug therapy: Secondary | ICD-10-CM

## 2016-11-23 DIAGNOSIS — E785 Hyperlipidemia, unspecified: Secondary | ICD-10-CM | POA: Diagnosis present

## 2016-11-23 DIAGNOSIS — R278 Other lack of coordination: Secondary | ICD-10-CM | POA: Diagnosis not present

## 2016-11-23 DIAGNOSIS — K449 Diaphragmatic hernia without obstruction or gangrene: Secondary | ICD-10-CM | POA: Diagnosis present

## 2016-11-23 DIAGNOSIS — R109 Unspecified abdominal pain: Secondary | ICD-10-CM

## 2016-11-23 DIAGNOSIS — R112 Nausea with vomiting, unspecified: Secondary | ICD-10-CM | POA: Diagnosis not present

## 2016-11-23 DIAGNOSIS — R627 Adult failure to thrive: Secondary | ICD-10-CM | POA: Diagnosis not present

## 2016-11-23 DIAGNOSIS — Z86711 Personal history of pulmonary embolism: Secondary | ICD-10-CM

## 2016-11-23 DIAGNOSIS — R17 Unspecified jaundice: Secondary | ICD-10-CM | POA: Diagnosis not present

## 2016-11-23 DIAGNOSIS — I5031 Acute diastolic (congestive) heart failure: Secondary | ICD-10-CM | POA: Diagnosis not present

## 2016-11-23 DIAGNOSIS — K2210 Ulcer of esophagus without bleeding: Secondary | ICD-10-CM

## 2016-11-23 DIAGNOSIS — Z66 Do not resuscitate: Secondary | ICD-10-CM | POA: Diagnosis present

## 2016-11-23 DIAGNOSIS — R000 Tachycardia, unspecified: Secondary | ICD-10-CM | POA: Diagnosis not present

## 2016-11-23 HISTORY — PX: ESOPHAGOGASTRODUODENOSCOPY: SHX5428

## 2016-11-23 LAB — CBC WITH DIFFERENTIAL/PLATELET
BASOS PCT: 0 %
Basophils Absolute: 0 10*3/uL (ref 0.0–0.1)
EOS ABS: 0.2 10*3/uL (ref 0.0–0.7)
EOS PCT: 2 %
HCT: 34.2 % — ABNORMAL LOW (ref 36.0–46.0)
Hemoglobin: 11 g/dL — ABNORMAL LOW (ref 12.0–15.0)
LYMPHS ABS: 1.5 10*3/uL (ref 0.7–4.0)
Lymphocytes Relative: 12 %
MCH: 27 pg (ref 26.0–34.0)
MCHC: 32.2 g/dL (ref 30.0–36.0)
MCV: 84 fL (ref 78.0–100.0)
MONOS PCT: 11 %
Monocytes Absolute: 1.4 10*3/uL — ABNORMAL HIGH (ref 0.1–1.0)
Neutro Abs: 9.6 10*3/uL — ABNORMAL HIGH (ref 1.7–7.7)
Neutrophils Relative %: 75 %
PLATELETS: 300 10*3/uL (ref 150–400)
RBC: 4.07 MIL/uL (ref 3.87–5.11)
RDW: 17 % — AB (ref 11.5–15.5)
WBC: 12.8 10*3/uL — ABNORMAL HIGH (ref 4.0–10.5)

## 2016-11-23 LAB — CBC
HCT: 27.9 % — ABNORMAL LOW (ref 36.0–46.0)
HEMATOCRIT: 30.2 % — AB (ref 36.0–46.0)
HEMOGLOBIN: 9.7 g/dL — AB (ref 12.0–15.0)
HEMOGLOBIN: 9.3 g/dL — AB (ref 12.0–15.0)
MCH: 27.6 pg (ref 26.0–34.0)
MCH: 28.4 pg (ref 26.0–34.0)
MCHC: 32.1 g/dL (ref 30.0–36.0)
MCHC: 33.3 g/dL (ref 30.0–36.0)
MCV: 86 fL (ref 78.0–100.0)
MCV: 85.3 fL (ref 78.0–100.0)
PLATELETS: 283 10*3/uL (ref 150–400)
Platelets: 339 10*3/uL (ref 150–400)
RBC: 3.51 MIL/uL — ABNORMAL LOW (ref 3.87–5.11)
RBC: 3.27 MIL/uL — ABNORMAL LOW (ref 3.87–5.11)
RDW: 17.6 % — AB (ref 11.5–15.5)
RDW: 17.4 % — ABNORMAL HIGH (ref 11.5–15.5)
WBC: 16.2 10*3/uL — ABNORMAL HIGH (ref 4.0–10.5)
WBC: 20.5 10*3/uL — ABNORMAL HIGH (ref 4.0–10.5)

## 2016-11-23 LAB — BLOOD GAS, ARTERIAL
ACID-BASE DEFICIT: 8.5 mmol/L — AB (ref 0.0–2.0)
BICARBONATE: 18.2 mmol/L — AB (ref 20.0–28.0)
Drawn by: 295031
FIO2: 100
O2 Saturation: 99 %
PEEP/CPAP: 5 cmH2O
Patient temperature: 98.6
RATE: 18 resp/min
VT: 500 mL
pCO2 arterial: 44.7 mmHg (ref 32.0–48.0)
pH, Arterial: 7.233 — ABNORMAL LOW (ref 7.350–7.450)
pO2, Arterial: 277 mmHg — ABNORMAL HIGH (ref 83.0–108.0)

## 2016-11-23 LAB — I-STAT CHEM 8, ED
BUN: 18 mg/dL (ref 6–20)
CHLORIDE: 108 mmol/L (ref 101–111)
CREATININE: 0.8 mg/dL (ref 0.44–1.00)
Calcium, Ion: 1.04 mmol/L — ABNORMAL LOW (ref 1.15–1.40)
GLUCOSE: 132 mg/dL — AB (ref 65–99)
HEMATOCRIT: 36 % (ref 36.0–46.0)
HEMOGLOBIN: 12.2 g/dL (ref 12.0–15.0)
POTASSIUM: 4.6 mmol/L (ref 3.5–5.1)
Sodium: 141 mmol/L (ref 135–145)
TCO2: 25 mmol/L (ref 0–100)

## 2016-11-23 LAB — COMPREHENSIVE METABOLIC PANEL
ALK PHOS: 244 U/L — AB (ref 38–126)
ALT: 60 U/L — AB (ref 14–54)
AST: 154 U/L — AB (ref 15–41)
Albumin: 2.4 g/dL — ABNORMAL LOW (ref 3.5–5.0)
Anion gap: 7 (ref 5–15)
BUN: 18 mg/dL (ref 6–20)
CALCIUM: 8.2 mg/dL — AB (ref 8.9–10.3)
CHLORIDE: 109 mmol/L (ref 101–111)
CO2: 23 mmol/L (ref 22–32)
CREATININE: 0.86 mg/dL (ref 0.44–1.00)
GFR calc Af Amer: >60 mL/min (ref >60)
Glucose, Bld: 127 mg/dL — ABNORMAL HIGH (ref 65–99)
Potassium: 4.6 mmol/L (ref 3.5–5.1)
SODIUM: 139 mmol/L (ref 135–145)
Total Bilirubin: 3.3 mg/dL — ABNORMAL HIGH (ref 0.3–1.2)
Total Protein: 5.9 g/dL — ABNORMAL LOW (ref 6.5–8.1)

## 2016-11-23 LAB — I-STAT CG4 LACTIC ACID, ED: LACTIC ACID, VENOUS: 2.73 mmol/L — AB (ref 0.5–1.9)

## 2016-11-23 LAB — PROTIME-INR
INR: 1.67
Prothrombin Time: 19.9 seconds — ABNORMAL HIGH (ref 11.4–15.2)

## 2016-11-23 LAB — MRSA PCR SCREENING: MRSA BY PCR: NEGATIVE

## 2016-11-23 LAB — LIPASE, BLOOD: LIPASE: 25 U/L (ref 11–51)

## 2016-11-23 LAB — PREPARE RBC (CROSSMATCH)

## 2016-11-23 SURGERY — LAPAROTOMY, EXPLORATORY
Anesthesia: General

## 2016-11-23 SURGERY — EGD (ESOPHAGOGASTRODUODENOSCOPY)
Anesthesia: Moderate Sedation

## 2016-11-23 MED ORDER — PHENYLEPHRINE HCL 10 MG/ML IJ SOLN
INTRAMUSCULAR | Status: AC
  Filled 2016-11-23: qty 2

## 2016-11-23 MED ORDER — OCTREOTIDE LOAD VIA INFUSION
50.0000 ug | Freq: Once | INTRAVENOUS | Status: AC
  Administered 2016-11-23: 50 ug via INTRAVENOUS
  Filled 2016-11-23: qty 25

## 2016-11-23 MED ORDER — PANTOPRAZOLE SODIUM 40 MG IV SOLR
40.0000 mg | Freq: Two times a day (BID) | INTRAVENOUS | Status: DC
  Administered 2016-11-26 – 2016-12-01 (×11): 40 mg via INTRAVENOUS
  Filled 2016-11-23 (×11): qty 40

## 2016-11-23 MED ORDER — FENTANYL CITRATE (PF) 100 MCG/2ML IJ SOLN
50.0000 ug | Freq: Once | INTRAMUSCULAR | Status: DC
  Filled 2016-11-23: qty 2

## 2016-11-23 MED ORDER — LIDOCAINE 2% (20 MG/ML) 5 ML SYRINGE
INTRAMUSCULAR | Status: AC
  Filled 2016-11-23: qty 5

## 2016-11-23 MED ORDER — SODIUM CHLORIDE 0.9 % IV SOLN
INTRAVENOUS | Status: DC
  Administered 2016-11-23 – 2016-11-29 (×4): via INTRAVENOUS

## 2016-11-23 MED ORDER — SODIUM CHLORIDE 0.9 % IV SOLN: 10.0000 mL/h | Freq: Once | INTRAVENOUS | Status: DC

## 2016-11-23 MED ORDER — SODIUM CHLORIDE 0.9 % IV SOLN
0.0000 ug/min | INTRAVENOUS | Status: DC
  Filled 2016-11-23: qty 1

## 2016-11-23 MED ORDER — ONDANSETRON HCL 4 MG/2ML IJ SOLN
4.0000 mg | Freq: Once | INTRAMUSCULAR | Status: AC
  Administered 2016-11-23: 4 mg via INTRAVENOUS
  Filled 2016-11-23: qty 2

## 2016-11-23 MED ORDER — SODIUM CHLORIDE 0.9 % IV SOLN
80.0000 mg | Freq: Once | INTRAVENOUS | Status: DC
  Filled 2016-11-23: qty 80

## 2016-11-23 MED ORDER — SODIUM CHLORIDE 0.9 % IV SOLN
50.0000 ug/h | INTRAVENOUS | Status: DC
  Administered 2016-11-23: 50 ug/h via INTRAVENOUS
  Filled 2016-11-23 (×2): qty 1

## 2016-11-23 MED ORDER — ROCURONIUM BROMIDE 50 MG/5ML IV SOSY
PREFILLED_SYRINGE | INTRAVENOUS | Status: AC
  Filled 2016-11-23: qty 5

## 2016-11-23 MED ORDER — SODIUM CHLORIDE 0.9 % IV SOLN: Freq: Once | INTRAVENOUS | Status: DC

## 2016-11-23 MED ORDER — NOREPINEPHRINE BITARTRATE 1 MG/ML IV SOLN
0.0000 ug/min | INTRAVENOUS | Status: DC
  Administered 2016-11-23: 5 ug/min via INTRAVENOUS
  Filled 2016-11-23 (×2): qty 4

## 2016-11-23 MED ORDER — FENTANYL BOLUS VIA INFUSION
50.0000 ug | INTRAVENOUS | Status: DC | PRN
  Administered 2016-11-24 – 2016-11-26 (×7): 50 ug via INTRAVENOUS
  Filled 2016-11-23: qty 50

## 2016-11-23 MED ORDER — PANTOPRAZOLE SODIUM 40 MG IV SOLR
40.0000 mg | Freq: Once | INTRAVENOUS | Status: AC
  Administered 2016-11-23: 40 mg via INTRAVENOUS
  Filled 2016-11-23: qty 40

## 2016-11-23 MED ORDER — ONDANSETRON HCL 4 MG/2ML IJ SOLN
4.0000 mg | Freq: Four times a day (QID) | INTRAMUSCULAR | Status: DC | PRN
  Administered 2016-12-05 – 2016-12-10 (×3): 4 mg via INTRAVENOUS
  Filled 2016-11-23 (×3): qty 2

## 2016-11-23 MED ORDER — MIDAZOLAM HCL 10 MG/2ML IJ SOLN
INTRAMUSCULAR | Status: DC | PRN
  Administered 2016-11-23: 2 mg via INTRAVENOUS

## 2016-11-23 MED ORDER — MIDAZOLAM HCL 2 MG/2ML IJ SOLN
INTRAMUSCULAR | Status: AC
  Filled 2016-11-23: qty 2

## 2016-11-23 MED ORDER — FENTANYL CITRATE (PF) 100 MCG/2ML IJ SOLN
INTRAMUSCULAR | Status: DC | PRN
  Administered 2016-11-23: 50 ug via INTRAVENOUS

## 2016-11-23 MED ORDER — METRONIDAZOLE IN NACL 5-0.79 MG/ML-% IV SOLN
500.0000 mg | Freq: Once | INTRAVENOUS | Status: DC
  Filled 2016-11-23: qty 100

## 2016-11-23 MED ORDER — VASOPRESSIN 20 UNIT/ML IV SOLN
0.0300 [IU]/min | INTRAVENOUS | Status: DC
  Administered 2016-11-23: 0.03 [IU]/min via INTRAVENOUS
  Filled 2016-11-23: qty 2

## 2016-11-23 MED ORDER — ONDANSETRON HCL 4 MG/2ML IJ SOLN
4.0000 mg | Freq: Four times a day (QID) | INTRAMUSCULAR | Status: DC | PRN
  Administered 2016-11-23: 4 mg via INTRAVENOUS
  Filled 2016-11-23 (×2): qty 2

## 2016-11-23 MED ORDER — SODIUM CHLORIDE 0.9 % IV BOLUS (SEPSIS)
1000.0000 mL | Freq: Once | INTRAVENOUS | Status: AC
  Administered 2016-11-23: 1000 mL via INTRAVENOUS

## 2016-11-23 MED ORDER — MIDAZOLAM HCL 2 MG/2ML IJ SOLN
2.0000 mg | INTRAMUSCULAR | Status: AC | PRN
  Administered 2016-11-24 – 2016-11-25 (×3): 2 mg via INTRAVENOUS
  Filled 2016-11-23 (×3): qty 2

## 2016-11-23 MED ORDER — FENTANYL CITRATE (PF) 100 MCG/2ML IJ SOLN
INTRAMUSCULAR | Status: AC
  Filled 2016-11-23: qty 2

## 2016-11-23 MED ORDER — CIPROFLOXACIN IN D5W 400 MG/200ML IV SOLN
400.0000 mg | Freq: Once | INTRAVENOUS | Status: DC
  Filled 2016-11-23: qty 200

## 2016-11-23 MED ORDER — SODIUM CHLORIDE 0.9 % IV SOLN
250.0000 mL | INTRAVENOUS | Status: DC | PRN
  Administered 2016-12-08: 250 mL via INTRAVENOUS

## 2016-11-23 MED ORDER — SODIUM CHLORIDE 0.9 % IV SOLN
INTRAVENOUS | Status: DC
  Administered 2016-11-23: 21:00:00 via INTRAVENOUS

## 2016-11-23 MED ORDER — MIDAZOLAM HCL 2 MG/2ML IJ SOLN
2.0000 mg | INTRAMUSCULAR | Status: DC | PRN
  Administered 2016-11-24 (×3): 2 mg via INTRAVENOUS
  Filled 2016-11-23 (×5): qty 2

## 2016-11-23 MED ORDER — PROPOFOL 10 MG/ML IV BOLUS
INTRAVENOUS | Status: AC
  Filled 2016-11-23: qty 20

## 2016-11-23 MED ORDER — MIDAZOLAM HCL 5 MG/ML IJ SOLN
INTRAMUSCULAR | Status: AC
  Filled 2016-11-23: qty 2

## 2016-11-23 MED ORDER — SODIUM CHLORIDE 0.9 % IV SOLN
8.0000 mg/h | INTRAVENOUS | Status: AC
  Administered 2016-11-23 – 2016-11-26 (×6): 8 mg/h via INTRAVENOUS
  Filled 2016-11-23 (×13): qty 80

## 2016-11-23 MED ORDER — FENTANYL CITRATE (PF) 100 MCG/2ML IJ SOLN
50.0000 ug | Freq: Once | INTRAMUSCULAR | Status: AC
  Administered 2016-11-23: 50 ug via INTRAVENOUS
  Filled 2016-11-23: qty 2

## 2016-11-23 MED ORDER — ETOMIDATE 2 MG/ML IV SOLN
INTRAVENOUS | Status: AC
  Filled 2016-11-23: qty 10

## 2016-11-23 MED ORDER — SODIUM CHLORIDE 0.9 % IV SOLN
25.0000 ug/h | INTRAVENOUS | Status: DC
  Administered 2016-11-23: 25 ug/h via INTRAVENOUS
  Administered 2016-11-24: 150 ug/h via INTRAVENOUS
  Administered 2016-11-25 (×2): 25 ug/h via INTRAVENOUS
  Administered 2016-11-25 – 2016-11-26 (×2): 125 ug/h via INTRAVENOUS
  Filled 2016-11-23 (×4): qty 50

## 2016-11-23 MED ORDER — SODIUM CHLORIDE 0.9 % IJ SOLN
PREFILLED_SYRINGE | INTRAMUSCULAR | Status: DC | PRN
  Administered 2016-11-23: 4 mL

## 2016-11-23 MED ORDER — EPINEPHRINE PF 1 MG/10ML IJ SOSY
PREFILLED_SYRINGE | INTRAMUSCULAR | Status: AC
  Filled 2016-11-23: qty 10

## 2016-11-23 MED ORDER — SUCCINYLCHOLINE CHLORIDE 200 MG/10ML IV SOSY
PREFILLED_SYRINGE | INTRAVENOUS | Status: AC
  Filled 2016-11-23: qty 10

## 2016-11-23 NOTE — Progress Notes (Signed)
In room for procedure 1400. Courtney RN bedside nurse in room during procedure to help manage blood pressure during procedure while on levofed and on vent.  Procedure scope out 1440, report given to [facility] at bedside .

## 2016-11-23 NOTE — ED Triage Notes (Addendum)
Per EMS, pt is from home with complaints of n/v with blood present. Pt had surgery approx two weeks ago for an abdominal hiatal hernia. Pt currently complaining of abdominal pain. Pt AOx4. Pt received 4 mg of zofran and 100 ml of NS. Pt has a 20g in left wrist.

## 2016-11-23 NOTE — Procedures (Signed)
Central Venous Catheter Insertion Procedure Note SHELAGH RAYMAN [id number] [birth year]  Procedure: Insertion of Central Venous Catheter Indications: Assessment of intravascular volume, Drug and/or fluid administration and Frequent blood sampling  Procedure Details Consent: Risks of procedure as well as the alternatives and risks of each were explained to the (patient/caregiver).  Consent for procedure obtained. Time Out: Verified patient identification, verified procedure, site/side was marked, verified correct patient position, special equipment/implants available, medications/allergies/relevent history reviewed, required imaging and test results available.  Performed  Maximum sterile technique was used including antiseptics, cap, gloves, gown, hand hygiene, mask and sheet. Skin prep: Chlorhexidine; local anesthetic administered A antimicrobial bonded/coated triple lumen catheter was placed in the right internal jugular vein to 16 cm using the Seldinger technique.  Evaluation Blood flow good Complications: No apparent complications Patient tolerated the procedure well. Chest X-ray ordered to verify placement.  CXR: verified placement, no pneumothorax   Procedure performed under direct supervision of Dr. Lake Bells and with ultrasound guidance for real time vessel cannulation.     Noe Gens, NP-C [facility] Pgr: [phone] or if no answer [phone] 11/23/2016, 2:51 PM

## 2016-11-23 NOTE — H&P (View-Only) (Signed)
Referring Provider: [facility]  Primary Care Physician:  Nyoka Cowden, MD Primary Gastroenterologist:   None. Unassigned  Reason for Consultation:  GI bleed  ASSESSMENT AND PLAN:   44. 66 yo female with hemodynamically unstable upper GI bleeding. She is s/p Nissen fundoplication mid February.  Actively vomiting large amounts of bright red blood she states since yesterday.  -Patient needs airway protection for an urgent EGD.  I spoke to the husband and consented him for the procedure. She is currently being intubated and CCM placing central line.  -ED has paged Surgery -PPI gtt is progress -Unfortunately she has several antibodies in her blood. She does have blood on the way here from [place].   2. Breast Cancer, s/p chemoxrt in 2014. Apparently felt to be in remission until recent liver biopsy during Nissen fundoplication showed metastatic disease.    3. Cirrhosis on CTscan today. Liver biopsy didn't show cirrhosis. Radiographic findings may represent metastatic disease and not actually cirrhosis.  4. Abnormal LFTs, possibly from liver mets   5. Colitis on CTscan. Will address once acute issues resolved and I can obtain history from patient.     HPI: Alexandria [name] is a 66 y.o. female with multiple medical problems. She has chronic anemia with negative workup in [place] around 2014. Per husband she was evaluated in [place] before that. She is followed by Heme/Onc for iron deficiency anemia as well as breast cancer. 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 Marin Olp.   Patient had a Nissen mid Feb. Liver looked abnormal, biopsies showed metastatic breast cancer.   Unable to obtain much history from her. She is alert for a few seconds at a time. She began vomiting blood yesterday.    Past Medical History:  Diagnosis Date  . Anxiety   . Breast  cancer ([place]) 10/02/12   Invasive Ductal Carcinoma of the Right Upper Outer Quadrant - ER (>90%), PR - Neg., Her2 Neu Negative, Ki-67 Unknown  . Chronic back pain    "all the way up and down"  . Chronic bronchitis ([place])    "get it q yr" (09/07/2014)  . Complication of anesthesia    "I come out really anxious; need Ativan to ease me out" (09/07/2014)  . Daily headache    "recently" (09/07/2014)  . DDD (degenerative disc disease), cervical   . DDD (degenerative disc disease), lumbosacral   . DDD (degenerative disc disease), thoracolumbar   . Diastolic dysfunction   . Diastolic heart failure ([place])   . Fibromyalgia   . GAD (generalized anxiety disorder)   . GERD (gastroesophageal reflux disease)   . H/O hiatal hernia   . Heart murmur   . History of blood transfusion "3-4"   "never can tell why; don't know where the blood was coming from; doesn't show up in stool or urine"  . Hx of pulmonary embolus    During c-Section  . Hx of radiation therapy 03/04/13- 04/17/13   right breast 50 Gy 25 fractions, right breast boost 10 Gy 5 fractions  . Hyperlipidemia   . Hypertension   . Iron deficiency anemia   . Lymphedema    bilateral lower extremity  . Obesity    Class 2  . Peripheral neuropathic pain   . Pneumonia    "at least twice/yr" (09/07/2014)  . PONV (postoperative nausea and vomiting)   . Recurrent major depression ([place])  06/2014   Seen by Dr. Louretta Shorten  . S/P radiation therapy  03/04/2013-04/17/2013   1) Right breast / 50 Gy in 25 fractions/ 2) Right breast boost / 10 Gy in 5 fractions  . Status post chemotherapy    4 cycles of Taxotere and cytoxan    Past Surgical History:  Procedure Laterality Date  . ABDOMINAL HYSTERECTOMY    . APPENDECTOMY    . BREAST BIOPSY Right   . BREAST LUMPECTOMY Right   . CESAREAN SECTION     [phone]  . DILATION AND CURETTAGE OF UTERUS  "numerous"  . FOOT SURGERY Right ~ 2013 X 3   "put pins in but pins kept breaking"  . HIATAL HERNIA  REPAIR N/A 11/03/2016   Procedure: LAPAROSCOPIC NISSEN AND  REPAIR OF HIATAL HERNIA,;  Surgeon: Johnathan Hausen, MD;  Location: [facility];  Service: General;  Laterality: N/A;  . INGUINAL HERNIA REPAIR Bilateral    "cancer"  . PARTIAL COLECTOMY N/A 07/04/2014   Procedure: REPAIR OF INCARCERATED INCISIONAL HERNIA WITH MESH;  Surgeon: Excell Seltzer, MD;  Location: [facility];  Service: General;  Laterality: N/A;  . TONSILLECTOMY    . UMBILICAL HERNIA REPAIR  X 2    Prior to Admission medications   Medication Sig Start Date End Date Taking? Authorizing Provider  albuterol (PROVENTIL HFA;VENTOLIN HFA) 108 (90 Base) MCG/ACT inhaler Inhale 2 puffs into the lungs every 6 (six) hours as needed for wheezing or shortness of breath. 04/22/16   Darreld Mclean, MD  anastrozole (ARIMIDEX) 1 MG tablet TAKE 1 TABLET(1 MG) BY MOUTH DAILY 10/27/16   Marletta Lor, MD  atorvastatin (LIPITOR) 20 MG tablet Take 1 tablet (20 mg total) by mouth daily. 10/27/16   Marletta Lor, MD  Biotin w/ Vitamins C & E (HAIR/SKIN/NAILS PO) Take 1 tablet by mouth 3 (three) times daily.    Historical Provider, MD  carvedilol (COREG) 3.125 MG tablet Take 1 tablet (3.125 mg total) by mouth 2 (two) times daily with a meal. 11/11/16   Oswald Hillock, MD  docusate sodium (COLACE) 100 MG capsule Take 1 capsule (100 mg total) by mouth daily. 11/11/16 12/11/16  Clovis Riley, MD  docusate sodium 100 MG CAPS Take 100 mg by mouth 2 (two) times daily. Patient taking differently: Take 100 mg by mouth 2 (two) times daily as needed (constipation).  07/10/14   Nat Christen, PA-C  EMBEDA 50-2 MG CPCR Take 1 capsule by mouth daily. 10/05/16   Historical Provider, MD  escitalopram (LEXAPRO) 10 MG tablet TAKE 1 TABLET BY MOUTH TWICE DAILY 08/25/16   Marletta Lor, MD  furosemide (LASIX) 40 MG tablet Take 1 tablet (40 mg total) by mouth 2 (two) times daily. 11/11/16   Oswald Hillock, MD  lidocaine (LIDODERM) 5 % Place 1 patch onto the skin every  12 (twelve) hours. Remove & Discard patch within 12 hours or as directed by MD    Historical Provider, MD  lisinopril (PRINIVIL,ZESTRIL) 5 MG tablet Take 1 tablet (5 mg total) by mouth daily. 10/27/16   Marletta Lor, MD  LORazepam (ATIVAN) 0.5 MG tablet Take 1 tablet (0.5 mg total) by mouth every 8 (eight) hours as needed. for anxiety Patient taking differently: Take 0.5 mg by mouth 3 (three) times daily.  05/19/16   Marletta Lor, MD  lubiprostone (AMITIZA) 24 MCG capsule Take 1 capsule (24 mcg total) by mouth 2 (two) times daily with a meal. 03/15/16   Doretha Sou  Burnice Logan, MD  metolazone (ZAROXOLYN) 2.5 MG tablet Take 1 tablet (2.5 mg total) by mouth daily as needed (fluid gain). 04/27/16   Thurnell Lose, MD  montelukast (SINGULAIR) 5 MG chewable tablet Chew 1 tablet (5 mg total) by mouth at bedtime. 07/05/16   Javier Glazier, MD  Multiple Vitamin (MULTIVITAMIN WITH MINERALS) TABS tablet Take 1 tablet by mouth daily.    Historical Provider, MD  potassium chloride SA (K-DUR,KLOR-CON) 20 MEQ tablet Take 1 tablet (20 mEq total) by mouth daily. 11/11/16   Oswald Hillock, MD  ranitidine (ZANTAC) 150 MG tablet Take 1 tablet (150 mg total) by mouth at bedtime. Patient taking differently: Take 150 mg by mouth 2 (two) times daily.  05/31/15   Javier Glazier, MD    Current Facility-Administered Medications  Medication Dose Route Frequency Provider Last Rate Last Dose  . 0.9 %  sodium chloride infusion   Intravenous Continuous Fatima Blank, MD 125 mL/hr at 11/23/16 1150    . ciprofloxacin (CIPRO) IVPB 400 mg  400 mg Intravenous Once Fatima Blank, MD      . metroNIDAZOLE (FLAGYL) IVPB 500 mg  500 mg Intravenous Once Fatima Blank, MD      . octreotide (SANDOSTATIN) 500 mcg in sodium chloride 0.9 % 250 mL (2 mcg/mL) infusion  50 mcg/hr Intravenous Continuous Fatima Blank, MD 25 mL/hr at 11/23/16 1054 50 mcg/hr at 11/23/16 1054  . ondansetron (ZOFRAN) injection 4  mg  4 mg Intravenous Q6H PRN Fatima Blank, MD   4 mg at 11/23/16 1043  . ondansetron (ZOFRAN) injection 4 mg  4 mg Intravenous Once Fatima Blank, MD      . pantoprazole (PROTONIX) 80 mg in sodium chloride 0.9 % 100 mL IVPB  80 mg Intravenous Once Fatima Blank, MD      . pantoprazole (PROTONIX) 80 mg in sodium chloride 0.9 % 250 mL (0.32 mg/mL) infusion  8 mg/hr Intravenous Continuous Fatima Blank, MD      . Derrill Memo ON 11/27/2016] pantoprazole (PROTONIX) injection 40 mg  40 mg Intravenous Q12H Fatima Blank, MD       Current Outpatient Prescriptions  Medication Sig Dispense Refill  . albuterol (PROVENTIL HFA;VENTOLIN HFA) 108 (90 Base) MCG/ACT inhaler Inhale 2 puffs into the lungs every 6 (six) hours as needed for wheezing or shortness of breath. 1 Inhaler 2  . anastrozole (ARIMIDEX) 1 MG tablet TAKE 1 TABLET(1 MG) BY MOUTH DAILY 90 tablet 1  . atorvastatin (LIPITOR) 20 MG tablet Take 1 tablet (20 mg total) by mouth daily. 90 tablet 1  . Biotin w/ Vitamins C & E (HAIR/SKIN/NAILS PO) Take 1 tablet by mouth 3 (three) times daily.    . carvedilol (COREG) 3.125 MG tablet Take 1 tablet (3.125 mg total) by mouth 2 (two) times daily with a meal. 60 tablet 2  . docusate sodium (COLACE) 100 MG capsule Take 1 capsule (100 mg total) by mouth daily. 30 capsule 2  . docusate sodium 100 MG CAPS Take 100 mg by mouth 2 (two) times daily. (Patient taking differently: Take 100 mg by mouth 2 (two) times daily as needed (constipation). ) 10 capsule 0  . EMBEDA 50-2 MG CPCR Take 1 capsule by mouth daily.  0  . escitalopram (LEXAPRO) 10 MG tablet TAKE 1 TABLET BY MOUTH TWICE DAILY 180 tablet 1  . furosemide (LASIX) 40 MG tablet Take 1 tablet (40 mg total) by mouth 2 (two) times daily. [place]  tablet 2  . lidocaine (LIDODERM) 5 % Place 1 patch onto the skin every 12 (twelve) hours. Remove & Discard patch within 12 hours or as directed by MD    . lisinopril (PRINIVIL,ZESTRIL) 5 MG  tablet Take 1 tablet (5 mg total) by mouth daily. 30 tablet 5  . LORazepam (ATIVAN) 0.5 MG tablet Take 1 tablet (0.5 mg total) by mouth every 8 (eight) hours as needed. for anxiety (Patient taking differently: Take 0.5 mg by mouth 3 (three) times daily. ) 270 tablet 1  . lubiprostone (AMITIZA) 24 MCG capsule Take 1 capsule (24 mcg total) by mouth 2 (two) times daily with a meal. 60 capsule 2  . metolazone (ZAROXOLYN) 2.5 MG tablet Take 1 tablet (2.5 mg total) by mouth daily as needed (fluid gain).    . montelukast (SINGULAIR) 5 MG chewable tablet Chew 1 tablet (5 mg total) by mouth at bedtime. 30 tablet 3  . Multiple Vitamin (MULTIVITAMIN WITH MINERALS) TABS tablet Take 1 tablet by mouth daily.    . potassium chloride SA (K-DUR,KLOR-CON) 20 MEQ tablet Take 1 tablet (20 mEq total) by mouth daily. 270 tablet 1  . ranitidine (ZANTAC) 150 MG tablet Take 1 tablet (150 mg total) by mouth at bedtime. (Patient taking differently: Take 150 mg by mouth 2 (two) times daily. ) 30 tablet 3    Allergies as of 11/23/2016 - Review Complete 11/23/2016  Allergen Reaction Noted  . Contrast media [iodinated diagnostic agents] Anaphylaxis 02/19/2013  . Solu-medrol [methylprednisolone acetate] Other (See Comments) 04/27/2016  . Albuterol Other (See Comments) 04/14/2013  . Prednisone Other (See Comments) 04/13/2013    Family History  Problem Relation Age of Onset  . Parkinson's disease Mother   . Emphysema Maternal Grandfather   . COPD Maternal Grandfather   . COPD Sister   . Heart attack Sister   . COPD Maternal Grandmother   . CAD Neg Hx     Social History   Social History  . Marital status: Married    Spouse name: N/A  . Number of children: N/A  . Years of education: N/A   Occupational History  . retired Therapist, sports    Social History Main Topics  . Smoking status: Never Smoker  . Smokeless tobacco: Never Used     Comment: Through father  . Alcohol use No  . Drug use: No  . Sexual activity: Not  Currently   Other Topics Concern  . Not on file   Social History Narrative   Originally from [place], [place]. Previously lived in [place]. [place] to [facility] in 2014. Previously worked as an Warden/ranger. No known TB exposure. No pets currently. No bird exposure. Minimal mold exposure.     Review of Systems: All systems reviewed and negative except where noted in HPI.  Physical Exam: Vital signs in last 24 hours: Temp:  [97.9 F (36.6 C)] 97.9 F (36.6 C) (03/08 0920) Pulse Rate:  [109-110] 110 (03/08 1058) Resp:  [20] 20 (03/08 1058) BP: (113-133)/(69-98) 133/98 (03/08 1058) SpO2:  [90 %-99 %] 97 % (03/08 1058)   General:   Obese white female who is partially alert.  Eyes:  Sclera clear, no icterus.   Conjunctiva pale. Ears:  Normal auditory acuity. Nose:  No deformity, discharge,  or lesions. Mouth:  No deformity or lesions.   Neck:  Supple; no masses. Lungs:  Clear throughout to auscultation.   No wheezes, crackles, or rhonchi.  Heart:  Tachy in 130s. . Abdomen:  Soft, obese, mild  diffuse tenderness.  BS active Rectal:  Deferred  Msk:  Symmetrical without gross deformities. . Pulses:  Normal pulses noted. Extremities:  Without clubbing or edema. Neurologic:  In and out of alertness. Confused about year.  Skin:  Intact without significant lesions or rashes.. Psych:  Alert and cooperative. Normal mood and affect.  Intake/Output from previous day: No intake/output data recorded. Intake/Output this shift: Total I/O In: 1000 [IV Piggyback:1000] Out: -   Lab Results:  Recent Labs  11/23/16 0944 11/23/16 0953  WBC 12.8*  --   HGB 11.0* 12.2  HCT 34.2* 36.0  PLT 300  --    BMET  Recent Labs  11/23/16 0944 11/23/16 0953  NA 139 141  K 4.6 4.6  CL 109 108  CO2 23  --   GLUCOSE 127* 132*  BUN 18 18  CREATININE 0.86 0.80  CALCIUM 8.2*  --    LFT  Recent Labs  11/23/16 0944  PROT 5.9*  ALBUMIN 2.4*  AST 154*  ALT 60*  ALKPHOS 244*  BILITOT 3.3*    PT/INR  Recent Labs  11/23/16 0944  LABPROT 19.9*  INR 1.67   Studies/Results: Ct Abdomen Pelvis Wo Contrast  Result Date: 11/23/2016 CLINICAL DATA:  Abdominal pain. Breast cancer, metastatic to the liver EXAM: CT ABDOMEN AND PELVIS WITHOUT CONTRAST TECHNIQUE: Multidetector CT imaging of the abdomen and pelvis was performed following the standard protocol without IV contrast. COMPARISON:  PET CT 11/15/2016 FINDINGS: Lower chest: Small left pleural effusion, slightly increased in size since prior PET CT. Bibasilar opacities, likely atelectasis. Heart is normal size. Small hiatal hernia. Hepatobiliary: Changes of cirrhosis. Previously seen extensive metastatic disease throughout the liver is difficult to appreciate on this unenhanced study. Gallbladder unremarkable. Pancreas: No focal abnormality or ductal dilatation. Spleen: No focal abnormality.  Normal size. Adrenals/Urinary Tract: No adrenal abnormality. No focal renal abnormality. No stones or hydronephrosis. Urinary bladder is unremarkable. Stomach/Bowel: No evidence of aneurysm or adenopathy. Scattered aortic calcifications Vascular/Lymphatic: There is wall thickening within the cecum and ascending colon to the level of the proximal transverse colon. This is concerning for colitis. Stomach and small bowel decompressed. Reproductive: Prior hysterectomy.  No adnexal masses. Other: Small amount of free fluid adjacent to the liver. No free air. Musculoskeletal: No acute bony abnormality. IMPRESSION: Changes of cirrhosis. Previously demonstrated metastatic disease to the liver is difficult to visualize on this unenhanced study. Small amount of perihepatic ascites.  Small left pleural effusion. Wall thickening and surrounding inflammatory stranding involving the cecum, ascending colon and proximal transverse colon concerning for colitis. Small hiatal hernia. Bibasilar atelectasis. Electronically Signed   By: Rolm Baptise M.D.   On: 11/23/2016 10:21     Tye Savoy, NP-C @  11/23/2016, 11:56 AM  Pager number [phone]

## 2016-11-23 NOTE — Op Note (Signed)
[facility] Patient Name: Alexandria [name] Procedure Date: 11/23/2016 MRN: [id number] Attending MD: Carlota Raspberry. Armbruster MD, MD Date of Birth: Jan 03, 1951 CSN: [id number] Age: 66 Admit Type: Emergency Department Procedure:                Upper GI endoscopy Indications:              massive hematemesis, history of metastatic breast                            cancer to liver, history of Nissen fundoplication,                            patient intubated Providers:                Carlota Raspberry. Armbruster MD, MD, Elmer Ramp. Hinson, RN,                            Corliss Parish, Technician Referring MD:              Medicines:                per sedation for ventilation Complications:            active bleeding throughout the procedure, unable to                            quantify Estimated Blood Loss:     active bleeding throughout the procedure, unable to                            quantify Procedure:                Pre-Anesthesia Assessment:                           - Prior to the procedure, a History and Physical                            was performed, and patient medications and                            allergies were reviewed. The patient's tolerance of                            previous anesthesia was also reviewed. The risks                            and benefits of the procedure and the sedation                            options and risks were discussed with the patient.                            All questions were answered, and informed consent                            was obtained.  Prior Anticoagulants: The patient has                            taken no previous anticoagulant or antiplatelet                            agents. ASA Grade Assessment: III - A patient with                            severe systemic disease. After reviewing the risks                            and benefits, the patient was deemed in                            satisfactory condition to  undergo the procedure.                           After obtaining informed consent, the endoscope was                            passed under direct vision. Throughout the                            procedure, the patient's blood pressure, pulse, and                            oxygen saturations were monitored continuously. The                            [id number] ([id number]) scope was introduced through the                            mouth, and advanced to the antrum of the stomach.                            The [id number]) scope was introduced through                            the mouth, and advanced to the duodenal bulb. The                            upper GI endoscopy was technically difficult and                            complex due to excessive bleeding. The patient                            tolerated the procedure. Scope In: Scope Out: Findings:      A large cratered esophageal ulcer with friable tissue and stigmata of       recent bleeding was found at the GEJ, ulcer appeared > 50% of the       diameter of the lumen. Just proximal to  this was a nodule which was       spurting blood - due to poor visualization it is unclear what this       lesion represents (did not appear to be varix, ? metastatic disease).       For hemostasis attempt, 3 hemostasis clips were fired, two hemostatic       clips were successfully placed across the lesion. Area was successfully       injected with 1:10,000 solution of epinephrine for drug delivery. These       measures appeared to slow the bleeding but unclear if complete       hemostasis was achieved. Visualization was extremely poor due to blood       and clot burden.      Red blood and clot was found in the entire esophagus and the stomach.       The patient vomited a significant amount of clot and food during the       procedure. Her airway was protected with the ET tube which was placed       prior to the endoscopy. It took several  minutes of clearing the clot to       visualize the lesion causing the bleeding.      Clotted blood and red blood, and food, was found in the entire examined       stomach and visualization was poor. In retroflexed view the cardia /       surgical site could not be visualized due to clot.      Clotted blood was found in the duodenal bulb. Impression:               - Large ulceration at GEJ, with nodule as described                            above with active bleeding. Clips were placed.                            Injected with epinephrine. Unclear if complete                            hemostasis was achieved, visualization was poor                            given the amount of blood and clot in the lumen                           - Blood noted throughout the examined stomach and                            small bowel with poor visualization                           No obvious defect noted in the esophagus or GEJ                            during the exam, however tissue was extremely  friable. Her abdomen was distended during and after                            the procedure raising the possibility for                            perforation. Moderate Sedation:      No moderate sedation, case performed with MAC Recommendation:           - Return patient to ICU for ongoing care.                           - NPO.                           - Continue present medications (IV protonix)                           - PRBC as needed                           - I discussed findings with the patient's husband                            and son, she is critically ill with high risk for                            mortality, and we are unable to get blood products                            quickly for her. If hemostasis was not achieved                            with endoscopic measures, or she has perforated                            from the ulceration, she would need  operative                            repair, or attempt at IR embolization if only                            active bleeding. Following discussion with family,                            they did not want any aggressive measures and                            wanted to keep her comfortable at this time, and                            did not want her to have an operation. I have  discussed her case with Dr. Marlou Starks of surgery and the                            critical care service who were in agreement with                            the plan and understanding of patient's wishes. We                            will continue to follow along Procedure Code(s):        --- Professional ---                           [phone], Esophagogastroduodenoscopy, flexible,                            transoral; with control of bleeding, any method Diagnosis Code(s):        --- Professional ---                           K22.11, Ulcer of esophagus with bleeding                           K22.8, Other specified diseases of esophagus                           K92.2, Gastrointestinal hemorrhage, unspecified                           K92.0, Hematemesis CPT copyright 2016 American Medical Association. All rights reserved. The codes documented in this report are preliminary and upon coder review may  be revised to meet current compliance requirements. Remo Lipps P. Armbruster MD, MD 11/23/2016 3:43:52 PM This report has been signed electronically. Number of Addenda: 0

## 2016-11-23 NOTE — ED Notes (Addendum)
Critical care at bedside placing central line 

## 2016-11-23 NOTE — ED Notes (Signed)
Bed: WA18 Expected date:  Expected time:  Means of arrival:  Comments: ems 

## 2016-11-23 NOTE — Progress Notes (Signed)
[facility]  Seen and examined with Nurse Practitioner Noe Gens, full note to follow.  Briefly, this is a lady with metastatic breast cancer just diagnosed who had a Nissen a few weeks ago who presented with an upper GI Bleed.  She was encephalopathic on my exam.  On exam Sleepy, wakes to touch, Belly soft, active bowel sounds lugns clear, normal effort Pale in appearance, acutely ill appearing Blood clots in mouth  CBC    Component Value Date/Time   WBC 12.8 (H) 11/23/2016 0944   RBC 4.07 11/23/2016 0944   HGB 12.2 11/23/2016 0953   HGB 11.2 (L) 07/31/2016 1339   HGB 8.7 (L) 11/22/2015 1440   HCT 36.0 11/23/2016 0953   HCT 36.0 07/31/2016 1339   HCT 29.5 (L) 11/22/2015 1440   PLT 300 11/23/2016 0944   PLT 224 07/31/2016 1339   PLT 235 11/22/2015 1440   MCV 84.0 11/23/2016 0944   MCV 80 (L) 07/31/2016 1339   MCV 66.1 (L) 11/22/2015 1440   MCH 27.0 11/23/2016 0944   MCHC 32.2 11/23/2016 0944   RDW 17.0 (H) 11/23/2016 0944   RDW 18.5 (H) 07/31/2016 1339   RDW 19.5 (H) 11/22/2015 1440   LYMPHSABS 1.5 11/23/2016 0944   LYMPHSABS 1.5 07/31/2016 1339   LYMPHSABS 1.0 11/22/2015 1440   MONOABS 1.4 (H) 11/23/2016 0944   MONOABS 0.4 11/22/2015 1440   EOSABS 0.2 11/23/2016 0944   EOSABS 0.2 07/31/2016 1339   BASOSABS 0.0 11/23/2016 0944   BASOSABS 0.0 07/31/2016 1339   BASOSABS 0.0 11/22/2015 1440   INR 1.6  Upper gi bleed post nissen> many possible sources, tumor to gi tract? Portal gastropathy? PUD? > plan in tubation for scope, octreotide, pantoprazole drip, continue aggressive volume resuscitation and levophed for shock, will add vasopressin.  Trying to get blood products but we are severely lmited due to her antibodies.   Shock> as above  I have spoken with our GI and surgery consultants, appreciate their help.  Updated her husband Peterson Ao myself, he says that she should not receive CPR, but full medical support short of that is OK  My cc time 31 minutes  Roselie Awkward, MD [place] [facility] Pager: [phone] Cell: [phone] After 3pm or if no response, call [phone]

## 2016-11-23 NOTE — Telephone Encounter (Signed)
Please advise 

## 2016-11-23 NOTE — Anesthesia Preprocedure Evaluation (Deleted)
Anesthesia Evaluation  Patient identified by MRN, date of birth, ID band Patient awake    Reviewed: Allergy & Precautions, H&P , NPO status , Patient's Chart, lab work & pertinent test results, reviewed documented beta blocker date and time   History of Anesthesia Complications (+) PONV and history of anesthetic complications  Airway Mallampati: II  TM Distance: >3 FB Neck ROM: full    Dental no notable dental hx.    Pulmonary asthma , pneumonia,    Pulmonary exam normal breath sounds clear to auscultation       Cardiovascular hypertension, + CAD, +CHF and + DOE  Normal cardiovascular exam+ Valvular Problems/Murmurs  Rhythm:regular Rate:Normal  Left ventricle: E/e&'>15 suggestive of elevated LV filling   pressures. The cavity size was normal. Systolic function was   normal. The estimated ejection fraction was in the range of 55%   to 60%. Wall motion was normal; there were no regional wall   motion abnormalities. There was an increased relative   contribution of atrial contraction to ventricular filling.   Doppler parameters are consistent with abnormal left ventricular   relaxation (grade 1 diastolic dysfunction). - Tricuspid valve: There was trivial regurgitation   Neuro/Psych  Headaches, PSYCHIATRIC DISORDERS Anxiety Depression  Neuromuscular disease negative psych ROS   GI/Hepatic Neg liver ROS, hiatal hernia, GERD  ,  Endo/Other  negative endocrine ROSMorbid obesity  Renal/GU negative Renal ROS  negative genitourinary   Musculoskeletal  (+) Arthritis , Fibromyalgia -  Abdominal (+) + obese,   Peds negative pediatric ROS (+)  Hematology  (+) anemia ,   Anesthesia Other Findings   Reproductive/Obstetrics negative OB ROS                             Anesthesia Physical  Anesthesia Plan  ASA: III and emergent  Anesthesia Plan: General   Post-op Pain Management:    Induction:  Intravenous, Rapid sequence and Cricoid pressure planned  Airway Management Planned: Oral ETT  Additional Equipment: Arterial line and CVP  Intra-op Plan:   Post-operative Plan: Possible Post-op intubation/ventilation  Informed Consent: I have reviewed the patients History and Physical, chart, labs and discussed the procedure including the risks, benefits and alternatives for the proposed anesthesia with the patient or authorized representative who has indicated his/her understanding and acceptance.   Dental advisory given  Plan Discussed with: CRNA  Anesthesia Plan Comments:         Anesthesia Quick Evaluation

## 2016-11-23 NOTE — Progress Notes (Signed)
Brief consultation note-late entry  [facility] was initially consulted by EDP to admit this patient. Chart was briefly reviewed. Patient is a 66 year old female who presented to the [facility] ED on 11/23/16 with multiple episodes of bloody emesis (large dark clots) ongoing since the day prior. She was slightly somnolent but easily arousable and was able to provide some history. She had been recently admitted 2/16-2/24 and underwent laparoscopic Nissen fundoplication for a hiatal hernia, by general surgery. During surgery she was also found to have an abnormal appearing liver which was biopsied confirming metastatic breast cancer. She is supposed to follow-up with outpatient oncology with a PET scan.  When I went to examine patient in the room, she was actively vomiting large clots of dark red blood into the emesis bag. At that time she was hemodynamically stable. However she was somnolent but easily arousable and was answering to some questions appropriately. Due to the critical nature of her presentation with acute massive upper GI bleed in the context of recent surgery, I proceeded to consult [facility], general surgery and critical care medicine right away. I discussed with all 3 services in detail. Subsequently CCM saw her in the ED and she was intubated for airway protection, procedures and mechanical ventilation. GI to discuss with CCS regarding safety of performing EGD in the context of recent fundoplication.  Physical exam Moderately built and obese female sitting up on comfortably in the gurney in the ED with no obvious respiratory distress. Both dried and fresh blood on her lips and on her gown. Intermittently vomiting in the back.  Stable vital signs at that time and telemetry showed normal sinus rhythm.  Respiratory system: Clear to auscultation without wheezing, rhonchi, crackles. No increased work of breathing. Cardiovascular system: S1 and S2 heard, RRR. No JVD, murmurs or pedal  edema. Abdominal exam: Obese. Healed laparoscopic scars. Mild diffuse tenderness without rigidity, guarding or rebound. No organomegaly or masses appreciated. Normal bowel sounds heard. CNS: Somnolent but easily arousable and oriented. No focal neurological deficits. Extremities: Moving all extremities symmetrically and well.  Labs Initial labs were reviewed: CMP significant for alkaline phosphatase 244, albumin 2.4, AST 154, ALT 60. CBC significant for hemoglobin 11, WBC 12.8, platelets 300. Lactate elevated at 2.73 CT abdomen and pelvis without contrast shows changes of cirrhosis. Previously demonstrated metastatic disease to the liver was difficult to visualize on this unenhanced study. Findings suggestive of colitis in the cecum, ascending and proximal transverse colon. Bibasilar atelectasis. PET scan results from 11/15/16: Cirrhosis and metastatic disease.  Assessment and plan 1. Acute massive upper GI bleed: After patient was intubated, patient underwent emergent EGD by [facility] which showed a large ulceration at GE junction, clips were placed, epinephrine were injected but unclear if this achieved complete hemostasis. Also abdominal distention concerning for perforated ulcer. Since then, GI and general surgery have discussed with family regarding options of surgical repair versus IR embolization. She is critically ill with high mortality. Family apparently have opted for nonaggressive and comfort oriented measures. She had been placed on IV PPI infusion and octreotide drip. 2. Acute blood loss anemia: As per report, patient has multiple antibodies and difficult to get her appropriately matched blood easily and blood has to be obtained from [place]. 3. Acute encephalopathy:? Related to metastatic liver disease and acute upper GI bleed 4. Acute respiratory failure: I had discussed with patient's spouse early on in her care and he had verbalized full code and so did patient. Patient was  intubated. Subsequently she was made DO NOT RESUSCITATE.  Although [facility] had been initially consulted for admission, due to her rapidly declining and critical illness, she was eventually admitted by [facility] service to ICU. [facility] signed off.  Vernell Leep, MD, FACP, FHM. [facility] Pager [phone]  If 7PM-7AM, please contact night-coverage [email or web address] Password [facility] 11/23/2016, 6:24 PM

## 2016-11-23 NOTE — Telephone Encounter (Signed)
Patient Name: Alexandria [name]  DOB: [birth year]    Initial Comment Caller states his wife is vomiting up blood.   Nurse Assessment  Nurse: Raphael Gibney, RN, [facility] Ingles Date/Time (Eastern Time): 11/23/2016 8:09:12 AM  Confirm and document reason for call. If symptomatic, describe symptoms. ---Caller states spouse is vomiting up blood. Had surgery 3 weeks ago for hiatal hernia. Having severe pain. No fever. Vomited bright red blood about 1/4 cup.  Does the patient have any new or worsening symptoms? ---Yes  Will a triage be completed? ---Yes  Related visit to physician within the last 2 weeks? ---No  Does the PT have any chronic conditions? (i.e. diabetes, asthma, etc.) ---Yes  List chronic conditions. ---recent hiatal hernia surgery  Is this a behavioral health or substance abuse call? ---No     Guidelines    Guideline Title Affirmed Question Affirmed Notes  Vomiting Blood [1] Vomited blood AND [2] more than a few streaks of blood (Exception: few streaks and only occurred once) (Exception: swallowed blood from a nosebleed or cut in the mouth)    Final Disposition User   Go to ED Now Raphael Gibney, RN, [facility] - ED   Disagree/Comply: Comply

## 2016-11-23 NOTE — H&P (Signed)
PULMONARY / CRITICAL CARE MEDICINE   Name: Alexandria [name] MRN: [id number] DOB: [birth year]    ADMISSION DATE:  11/23/2016 CONSULTATION DATE:  11/23/16  REFERRING MD:  Dr. 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  The patient reported the bleeding began on 3/7, was bright red and was associated with abdominal pain.    She was recently admitted from 2/16-2/24 for nissen fundoplication in setting of hiatal hernia.  During the surgery, her liver was noted to have an abnormal appearance prompting biopsy which was positive for metastatic breast cancer. Hospital course was complicated by AKI in the setting of hypotension and volume depletion.   Chart review shows she was having difficulty at home and husband had requested for her to be placed in a SNF.  The patient was scheduled to have a PET scan 2/28 which showed cirrhotic / hypermetabolic liver consistent with metastatic disease.  She was unable to make her ONC follow up.   The patient presented to the ER 3/8 with GIB. She was altered, grey and vomiting up blood.  Initial labs - Na 141, K 4.6, Cl 108, glucose 132, BUN 18, Sr Cr 0.80, lactic acid 2.73, WBC 12.8, Hgb 11 and platelets 300.  She developed hypotension and progressive lethargy requiring intubation.  Central line placed.  GI, CCS & CCM called for evaluation.   PAST MEDICAL HISTORY :  She  has a past medical history of Anxiety; Breast cancer ([place]) (10/02/12); Chronic back pain; Chronic bronchitis ([place]); Complication of anesthesia; Daily headache; DDD (degenerative disc disease), cervical; DDD (degenerative disc disease), lumbosacral; DDD (degenerative disc disease), thoracolumbar; Diastolic dysfunction; Diastolic heart failure ([place]); Fibromyalgia; GAD (generalized anxiety disorder); GERD (gastroesophageal reflux disease); H/O hiatal hernia; Heart murmur;  History of blood transfusion ("3-4"); pulmonary embolus; radiation therapy (03/04/13- 04/17/13); Hyperlipidemia; Hypertension; Iron deficiency anemia; Lymphedema; Obesity; Peripheral neuropathic pain; Pneumonia; PONV (postoperative nausea and vomiting); 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  PAST SURGICAL HISTORY: She  has a past surgical history that includes Cesarean section; Partial colectomy (N/A, 07/04/2014); Tonsillectomy; Appendectomy; Breast biopsy (Right); Breast lumpectomy (Right); Inguinal hernia repair (Bilateral); Umbilical hernia repair (X 2); Dilation and curettage of uterus ("numerous"); Abdominal hysterectomy; Foot surgery (Right, ~ 2013 X 3); and Hiatal hernia repair (N/A, 11/03/2016).  Allergies  Allergen Reactions  . Contrast Media [Iodinated Diagnostic Agents] Anaphylaxis  . Solu-Medrol [Methylprednisolone Acetate] Other (See Comments)    Pt. States she is not allergic, just can't tolerate medication well.   . Albuterol Other (See Comments)    Anxious   . Prednisone Other (See Comments)    Pt gets very agitated when she takes high doses of steroids    No current facility-administered medications on file prior to encounter.    Current Outpatient Prescriptions on File Prior to Encounter  Medication Sig  . albuterol (PROVENTIL HFA;VENTOLIN HFA) 108 (90 Base) MCG/ACT inhaler Inhale 2 puffs into the lungs every 6 (six) hours as needed for wheezing or shortness of breath.  . anastrozole (ARIMIDEX) 1 MG tablet TAKE 1 TABLET(1 MG) BY MOUTH DAILY  . atorvastatin (LIPITOR) 20 MG tablet Take 1 tablet (20 mg total) by mouth daily.  . Biotin w/ Vitamins C & E (HAIR/SKIN/NAILS PO) Take 1 tablet by mouth 3 (three) times daily.  . carvedilol (  COREG) 3.125 MG tablet Take 1 tablet (3.125 mg total) by mouth 2 (two) times daily with a meal.  . docusate sodium (COLACE) 100 MG capsule Take 1 capsule (100 mg total)  by mouth daily.  Marland Kitchen docusate sodium 100 MG CAPS Take 100 mg by mouth 2 (two) times daily. (Patient taking differently: Take 100 mg by mouth 2 (two) times daily as needed (constipation). )  . EMBEDA 50-2 MG CPCR Take 1 capsule by mouth daily.  Marland Kitchen escitalopram (LEXAPRO) 10 MG tablet TAKE 1 TABLET BY MOUTH TWICE DAILY  . furosemide (LASIX) 40 MG tablet Take 1 tablet (40 mg total) by mouth 2 (two) times daily.  Marland Kitchen lidocaine (LIDODERM) 5 % Place 1 patch onto the skin every 12 (twelve) hours. Remove & Discard patch within 12 hours or as directed by MD  . lisinopril (PRINIVIL,ZESTRIL) 5 MG tablet Take 1 tablet (5 mg total) by mouth daily.  Marland Kitchen LORazepam (ATIVAN) 0.5 MG tablet Take 1 tablet (0.5 mg total) by mouth every 8 (eight) hours as needed. for anxiety (Patient taking differently: Take 0.5 mg by mouth 3 (three) times daily. )  . lubiprostone (AMITIZA) 24 MCG capsule Take 1 capsule (24 mcg total) by mouth 2 (two) times daily with a meal.  . metolazone (ZAROXOLYN) 2.5 MG tablet Take 1 tablet (2.5 mg total) by mouth daily as needed (fluid gain).  . montelukast (SINGULAIR) 5 MG chewable tablet Chew 1 tablet (5 mg total) by mouth at bedtime.  . Multiple Vitamin (MULTIVITAMIN WITH MINERALS) TABS tablet Take 1 tablet by mouth daily.  . potassium chloride SA (K-DUR,KLOR-CON) 20 MEQ tablet Take 1 tablet (20 mEq total) by mouth daily.  . ranitidine (ZANTAC) 150 MG tablet Take 1 tablet (150 mg total) by mouth at bedtime. (Patient taking differently: Take 150 mg by mouth 2 (two) times daily. )    FAMILY HISTORY:  Her indicated that her mother is deceased. She indicated that her father is deceased. She indicated that the status of her sister is unknown. She indicated that the status of her maternal grandmother is unknown. She indicated that the status of her maternal grandfather is unknown. She indicated that the status of her neg hx is unknown.    SOCIAL HISTORY: She  reports that she has never smoked. She has  never used smokeless tobacco. She reports that she does not drink alcohol or use drugs.  REVIEW OF SYSTEMS:  Unable to complete as patient is altered on mechanical ventilation  SUBJECTIVE:   VITAL SIGNS: BP (!) 142/106 (BP Location: Left Arm)   Pulse (!) 146   Temp 97.9 F (36.6 C) (Oral)   Resp 18   LMP 01/30/2005   SpO2 96%   HEMODYNAMICS:    VENTILATOR SETTINGS:    INTAKE / OUTPUT: No intake/output data recorded.  PHYSICAL EXAMINATION: General:  Obese female, critically ill appearing  Neuro:  Lethargic, confused prior to intubation  HEENT:  ETT, MM with bright red blood Cardiovascular:  s1s2 rrr, no m/r/g  Lungs:  Even/non-labored, lungs bilaterally clear Abdomen:  Obese/soft, bsx4 active  Musculoskeletal:  No acute deformities  Skin:  Warm/dry, BLE edema 1+  LABS:  BMET  Recent Labs Lab 11/23/16 0944 11/23/16 0953  NA 139 141  K 4.6 4.6  CL 109 108  CO2 23  --   BUN 18 18  CREATININE 0.86 0.80  GLUCOSE 127* 132*    Electrolytes  Recent Labs Lab 11/23/16 0944  CALCIUM 8.2*    CBC  Recent Labs Lab 11/23/16 0944 11/23/16 0953  WBC 12.8*  --   HGB 11.0* 12.2  HCT 34.2* 36.0  PLT 300  --     Coag's  Recent Labs Lab 11/23/16 0944  INR 1.67    Sepsis Markers  Recent Labs Lab 11/23/16 0953  LATICACIDVEN 2.73*    ABG No results for input(s): PHART, PCO2ART, PO2ART in the last 168 hours.  Liver Enzymes  Recent Labs Lab 11/23/16 0944  AST 154*  ALT 60*  ALKPHOS 244*  BILITOT 3.3*  ALBUMIN 2.4*    Cardiac Enzymes No results for input(s): TROPONINI, PROBNP in the last 168 hours.  Glucose No results for input(s): GLUCAP in the last 168 hours.  Imaging Ct Abdomen Pelvis Wo Contrast  Result Date: 11/23/2016 CLINICAL DATA:  Abdominal pain. Breast cancer, metastatic to the liver EXAM: CT ABDOMEN AND PELVIS WITHOUT CONTRAST TECHNIQUE: Multidetector CT imaging of the abdomen and pelvis was performed following the standard  protocol without IV contrast. COMPARISON:  PET CT 11/15/2016 FINDINGS: Lower chest: Small left pleural effusion, slightly increased in size since prior PET CT. Bibasilar opacities, likely atelectasis. Heart is normal size. Small hiatal hernia. Hepatobiliary: Changes of cirrhosis. Previously seen extensive metastatic disease throughout the liver is difficult to appreciate on this unenhanced study. Gallbladder unremarkable. Pancreas: No focal abnormality or ductal dilatation. Spleen: No focal abnormality.  Normal size. Adrenals/Urinary Tract: No adrenal abnormality. No focal renal abnormality. No stones or hydronephrosis. Urinary bladder is unremarkable. Stomach/Bowel: No evidence of aneurysm or adenopathy. Scattered aortic calcifications Vascular/Lymphatic: There is wall thickening within the cecum and ascending colon to the level of the proximal transverse colon. This is concerning for colitis. Stomach and small bowel decompressed. Reproductive: Prior hysterectomy.  No adnexal masses. Other: Small amount of free fluid adjacent to the liver. No free air. Musculoskeletal: No acute bony abnormality. IMPRESSION: Changes of cirrhosis. Previously demonstrated metastatic disease to the liver is difficult to visualize on this unenhanced study. Small amount of perihepatic ascites.  Small left pleural effusion. Wall thickening and surrounding inflammatory stranding involving the cecum, ascending colon and proximal transverse colon concerning for colitis. Small hiatal hernia. Bibasilar atelectasis. Electronically Signed   By: Rolm Baptise M.D.   On: 11/23/2016 10:21     STUDIES:  EGD 3/8 >> perforated ulcer with active bleeding  CULTURES:   ANTIBIOTICS:   SIGNIFICANT EVENTS: 3/08  Admit with GIB, hypotension/shock  LINES/TUBES: ETT 3/8 >>  R IJ TLC 3/8 >>   DISCUSSION: 66 y/o F with recent Nissen Fundoplication and incidental finding of metastatic breast cancer.  ASSESSMENT /  PLAN:  PULMONARY A: Acute Respiratory Failure in setting of Hemorrhagic Shock  P:   PRVC 8cc/kg  Wean PEEP/FiO2 for sats > 92% Adjusted ETT  Follow CXR   CARDIOVASCULAR A:  Hemorrhagic Shock in setting of GIB  Hx HTN, HLD, dCHF AF  P:  Admit to ICU  Volume resuscitation with blood products when available  Levophed for MAP >65 Vasopressin gtt  NS @ 24ml/hr  DNR in the event of arrest   RENAL A:   At Risk AKI - in setting of volume depletion / hypotension  P:   Trend BMP / UOP  Replace electrolytes as indicated   GASTROINTESTINAL A:   Acute GIB in setting of Gastric Ulcer with Perforation  Metastatic Breast Cancer to Liver  Hx Hiatal Hernia s/p Nissen Fundoplication  P:   NPO  Appreciate GI/CCS assistance  Continue protonix gtt D/c sandostatin (per GI)  HEMATOLOGIC / ONC  A:   Acute Blood Loss Anemia  Breast Cancer with Metastasis to Liver  P:  Trend CBC Q4  PRBC's for Hgb <7, await arrival from blood bank.  Patient has multiple antibodies. FFP now     INFECTIOUS A:   No evidence of acute infectious process  P:   Monitor fever curve / WBC   ENDOCRINE A:   At Risk Hypo/Hyperglycemia    P:   Trend CBG  NEUROLOGIC A:   Acute Encephalopathy in setting of GIB  P:   RASS goal: 0 to -1  Fentanyl gtt for comfort  PRN versed for sedation    FAMILY  - Updates: Husband Peterson Ao) updated at bedside.  He states his first priority is her comfort given recent diagnosis.  He does not want her to suffer.  Patient initially refused intubation but was agreeable for short term only.  She underwent urgent EGD per GI with evidence of perforated ulcer.  GI clipped site of bleeding but was difficult to visualize area due to blood.  CCS evaluated and discussed surgery with family who declined surgical intervention. DNR in the event of arrest.  If stabilized, give blood products on arrival.  Otherwise if patient declines, focus on comfort.  Family updated extensively by CCM,  CCS and GI.    - Inter-disciplinary family meet or Palliative Care meeting due by: Completed on admit.     CC Time:  60 minutes, time separate of procedures.    Noe Gens, NP-C [facility] Pgr: [phone] or if no answer [phone] 11/23/2016, 3:12 PM

## 2016-11-23 NOTE — Interval H&P Note (Signed)
History and Physical Interval Note:  11/23/2016 1:47 PM  Alexandria [name]  has presented today for surgery, with the diagnosis of hematemesis  The various methods of treatment have been discussed with the patient and family. After consideration of risks, benefits and other options for treatment, the patient has consented to  Procedure(s): ESOPHAGOGASTRODUODENOSCOPY (EGD) (N/A) as a surgical intervention .  The patient's history has been reviewed, patient examined, no change in status, stable for surgery.  I have reviewed the patient's chart and labs.  Questions were answered to the patient's satisfaction.     Renelda Loma Ellis Mehaffey

## 2016-11-23 NOTE — Telephone Encounter (Signed)
Pt is at ER

## 2016-11-23 NOTE — Consult Note (Signed)
Reason for Consult:vomiting blood Referring Physician: Dr. Barkley Bruns Alexandria [name] is an 66 y.o. female.  HPI: The patient is a 66 year old white female who is about 3 weeks s/p lap nissen for a hiatal hernia. At that time she was found to have diffuse metastatic disease of the liver with her primary being from breast cancer. She returned today after vomiting up blood. She was in respiratory distress and had to be intubated. She was undergoing an endoscopy when we arrived and there was a large amount of blood coming from the stomach. The Gastroenterologist tried to get a clip on it. I spoke with the family about the options for treatment. I let them know that likely the only way to stop the bleeding would be to operate on her. They felt like with her underlying metastaic disease that she would not want any further measures to be taken.  Past Medical History:  Diagnosis Date  . Anxiety   . Breast cancer ([place]) 10/02/12   Invasive Ductal Carcinoma of the Right Upper Outer Quadrant - ER (>90%), PR - Neg., Her2 Neu Negative, Ki-67 Unknown  . Chronic back pain    "all the way up and down"  . Chronic bronchitis ([place])    "get it q yr" (09/07/2014)  . Complication of anesthesia    "I come out really anxious; need Ativan to ease me out" (09/07/2014)  . Daily headache    "recently" (09/07/2014)  . DDD (degenerative disc disease), cervical   . DDD (degenerative disc disease), lumbosacral   . DDD (degenerative disc disease), thoracolumbar   . Diastolic dysfunction   . Diastolic heart failure ([place])   . Fibromyalgia   . GAD (generalized anxiety disorder)   . GERD (gastroesophageal reflux disease)   . H/O hiatal hernia   . Heart murmur   . History of blood transfusion "3-4"   "never can tell why; don't know where the blood was coming from; doesn't show up in stool or urine"  . Hx of pulmonary embolus    During c-Section  . Hx of radiation therapy 03/04/13- 04/17/13   right breast 50 Gy 25 fractions,  right breast boost 10 Gy 5 fractions  . Hyperlipidemia   . Hypertension   . Iron deficiency anemia   . Lymphedema    bilateral lower extremity  . Obesity    Class 2  . Peripheral neuropathic pain   . Pneumonia    "at least twice/yr" (09/07/2014)  . PONV (postoperative nausea and vomiting)   . Recurrent major depression ([name]) 06/2014   Seen by Dr. Louretta Shorten  . S/P radiation therapy  03/04/2013-04/17/2013   1) Right breast / 50 Gy in 25 fractions/ 2) Right breast boost / 10 Gy in 5 fractions  . Status post chemotherapy    4 cycles of Taxotere and cytoxan    Past Surgical History:  Procedure Laterality Date  . ABDOMINAL HYSTERECTOMY    . APPENDECTOMY    . BREAST BIOPSY Right   . BREAST LUMPECTOMY Right   . CESAREAN SECTION     [phone]  . DILATION AND CURETTAGE OF UTERUS  "numerous"  . FOOT SURGERY Right ~ 2013 X 3   "put pins in but pins kept breaking"  . HIATAL HERNIA REPAIR N/A 11/03/2016   Procedure: LAPAROSCOPIC NISSEN AND  REPAIR OF HIATAL HERNIA,;  Surgeon: Johnathan Hausen, MD;  Location: [facility];  Service: General;  Laterality: N/A;  . INGUINAL HERNIA REPAIR Bilateral    "cancer"  .  PARTIAL COLECTOMY N/A 07/04/2014   Procedure: REPAIR OF INCARCERATED INCISIONAL HERNIA WITH MESH;  Surgeon: Excell Seltzer, MD;  Location: [facility];  Service: General;  Laterality: N/A;  . TONSILLECTOMY    . UMBILICAL HERNIA REPAIR  X 2    Family History  Problem Relation Age of Onset  . Parkinson's disease Mother   . Emphysema Maternal Grandfather   . COPD Maternal Grandfather   . COPD Sister   . Heart attack Sister   . COPD Maternal Grandmother   . CAD Neg Hx     Social History:  reports that she has never smoked. She has never used smokeless tobacco. She reports that she does not drink alcohol or use drugs.  Allergies:  Allergies  Allergen Reactions  . Contrast Media [Iodinated Diagnostic Agents] Anaphylaxis  . Solu-Medrol [Methylprednisolone Acetate] Other (See  Comments)    Pt. States she is not allergic, just can't tolerate medication well.   . Albuterol Other (See Comments)    Anxious   . Prednisone Other (See Comments)    Pt gets very agitated when she takes high doses of steroids    Medications: I have reviewed the patient's current medications.  Results for orders placed or performed during the hospital encounter of 11/23/16 (from the past 48 hour(s))  Comprehensive metabolic panel     Status: Abnormal   Collection Time: 11/23/16  9:44 AM  Result Value Ref Range   Sodium 139 135 - 145 mmol/L   Potassium 4.6 3.5 - 5.1 mmol/L   Chloride 109 101 - 111 mmol/L   CO2 23 22 - 32 mmol/L   Glucose, Bld 127 (H) 65 - 99 mg/dL   BUN 18 6 - 20 mg/dL   Creatinine, Ser 0.86 0.44 - 1.00 mg/dL   Calcium 8.2 (L) 8.9 - 10.3 mg/dL   Total Protein 5.9 (L) 6.5 - 8.1 g/dL   Albumin 2.4 (L) 3.5 - 5.0 g/dL   AST 154 (H) 15 - 41 U/L   ALT 60 (H) 14 - 54 U/L   Alkaline Phosphatase 244 (H) 38 - 126 U/L   Total Bilirubin 3.3 (H) 0.3 - 1.2 mg/dL   GFR calc non Af Amer >60 >60 mL/min   GFR calc Af Amer >60 >60 mL/min    Comment: (NOTE) The eGFR has been calculated using the CKD EPI equation. This calculation has not been validated in all clinical situations. eGFR's persistently <60 mL/min signify possible Chronic Kidney Disease.    Anion gap 7 5 - 15  CBC WITH DIFFERENTIAL     Status: Abnormal   Collection Time: 11/23/16  9:44 AM  Result Value Ref Range   WBC 12.8 (H) 4.0 - 10.5 K/uL   RBC 4.07 3.87 - 5.11 MIL/uL   Hemoglobin 11.0 (L) 12.0 - 15.0 g/dL   HCT 34.2 (L) 36.0 - 46.0 %   MCV 84.0 78.0 - 100.0 fL   MCH 27.0 26.0 - 34.0 pg   MCHC 32.2 30.0 - 36.0 g/dL   RDW 17.0 (H) 11.5 - 15.5 %   Platelets 300 150 - 400 K/uL    Comment: REPEATED TO VERIFY SPECIMEN CHECKED FOR CLOTS PLATELET COUNT CONFIRMED BY SMEAR    Neutrophils Relative % 75 %   Neutro Abs 9.6 (H) 1.7 - 7.7 K/uL   Lymphocytes Relative 12 %   Lymphs Abs 1.5 0.7 - 4.0 K/uL    Monocytes Relative 11 %   Monocytes Absolute 1.4 (H) 0.1 - 1.0 K/uL   Eosinophils  Relative 2 %   Eosinophils Absolute 0.2 0.0 - 0.7 K/uL   Basophils Relative 0 %   Basophils Absolute 0.0 0.0 - 0.1 K/uL  Type and screen [place]     Status: None   Collection Time: 11/23/16  9:44 AM  Result Value Ref Range   ABO/RH(D) O POS    Antibody Screen NEG    Sample Expiration 11/26/2016    DAT, IgG NEG   Protime-INR     Status: Abnormal   Collection Time: 11/23/16  9:44 AM  Result Value Ref Range   Prothrombin Time 19.9 (H) 11.4 - 15.2 seconds   INR 1.67   Lipase, blood     Status: None   Collection Time: 11/23/16  9:44 AM  Result Value Ref Range   Lipase 25 11 - 51 U/L  I-Stat Chem 8, ED  (not at [facility])     Status: Abnormal   Collection Time: 11/23/16  9:53 AM  Result Value Ref Range   Sodium 141 135 - 145 mmol/L   Potassium 4.6 3.5 - 5.1 mmol/L   Chloride 108 101 - 111 mmol/L   BUN 18 6 - 20 mg/dL   Creatinine, Ser 0.80 0.44 - 1.00 mg/dL   Glucose, Bld 132 (H) 65 - 99 mg/dL   Calcium, Ion 1.04 (L) 1.15 - 1.40 mmol/L   TCO2 25 0 - 100 mmol/L   Hemoglobin 12.2 12.0 - 15.0 g/dL   HCT 36.0 36.0 - 46.0 %  I-Stat CG4 Lactic Acid, ED  (not at [facility])     Status: Abnormal   Collection Time: 11/23/16  9:53 AM  Result Value Ref Range   Lactic Acid, Venous 2.73 (HH) 0.5 - 1.9 mmol/L   Comment NOTIFIED PHYSICIAN   Prepare fresh frozen plasma     Status: None (Preliminary result)   Collection Time: 11/23/16  2:21 PM  Result Value Ref Range   Unit Number [id number]    Blood Component Type THAWED PLASMA    Unit division 00    Status of Unit ALLOCATED    Transfusion Status OK TO TRANSFUSE    Unit Number [id number]    Blood Component Type THAWED PLASMA    Unit division 00    Status of Unit ALLOCATED    Transfusion Status OK TO TRANSFUSE     Ct Abdomen Pelvis Wo Contrast  Result Date: 11/23/2016 CLINICAL DATA:  Abdominal pain. Breast cancer, metastatic to  the liver EXAM: CT ABDOMEN AND PELVIS WITHOUT CONTRAST TECHNIQUE: Multidetector CT imaging of the abdomen and pelvis was performed following the standard protocol without IV contrast. COMPARISON:  PET CT 11/15/2016 FINDINGS: Lower chest: Small left pleural effusion, slightly increased in size since prior PET CT. Bibasilar opacities, likely atelectasis. Heart is normal size. Small hiatal hernia. Hepatobiliary: Changes of cirrhosis. Previously seen extensive metastatic disease throughout the liver is difficult to appreciate on this unenhanced study. Gallbladder unremarkable. Pancreas: No focal abnormality or ductal dilatation. Spleen: No focal abnormality.  Normal size. Adrenals/Urinary Tract: No adrenal abnormality. No focal renal abnormality. No stones or hydronephrosis. Urinary bladder is unremarkable. Stomach/Bowel: No evidence of aneurysm or adenopathy. Scattered aortic calcifications Vascular/Lymphatic: There is wall thickening within the cecum and ascending colon to the level of the proximal transverse colon. This is concerning for colitis. Stomach and small bowel decompressed. Reproductive: Prior hysterectomy.  No adnexal masses. Other: Small amount of free fluid adjacent to the liver. No free air. Musculoskeletal: No acute bony abnormality. IMPRESSION: Changes of  cirrhosis. Previously demonstrated metastatic disease to the liver is difficult to visualize on this unenhanced study. Small amount of perihepatic ascites.  Small left pleural effusion. Wall thickening and surrounding inflammatory stranding involving the cecum, ascending colon and proximal transverse colon concerning for colitis. Small hiatal hernia. Bibasilar atelectasis. Electronically Signed   By: Rolm Baptise M.D.   On: 11/23/2016 10:21   Dg Chest Portable 1 View  Result Date: 11/23/2016 CLINICAL DATA:  Post intubation. History of metastatic breast cancer, cirrhosis. EXAM: PORTABLE CHEST 1 VIEW COMPARISON:  Chest radiograph November 07, 2016  FINDINGS: Cardiac silhouette is mildly enlarged, improved from prior radiograph. Tortuous, mildly calcified aorta. Persistently elevated RIGHT hemidiaphragm with low lung volumes. Chronic interstitial changes with strandy densities in lung bases. No pleural effusion. No pneumothorax. RIGHT mainstem bronchus intubation. RIGHT internal jugular central venous catheter projects at cavoatrial junction. Surgical clips RIGHT breast. Osseous structures are nonsuspicious. IMPRESSION: RIGHT mainstem bronchus intubation, recommend 2-3 cm retraction. RIGHT IJ central venous catheter distal tip at cavoatrial junction. No pneumothorax. Mild cardiomegaly, mild chronic interstitial changes with bibasilar atelectasis/ scarring. Acute findings discussed with and reconfirmed by Dr.PEDRO CARDAMA on 11/23/2016 at 1:55 pm. Electronically Signed   By: Elon Alas M.D.   On: 11/23/2016 13:56    Review of Systems  HENT: Negative.   Eyes: Negative.   Respiratory: Negative.   Cardiovascular: Positive for leg swelling.  Gastrointestinal: Positive for vomiting.  Genitourinary: Negative.   Musculoskeletal: Negative.   Skin: Negative.   Neurological: Positive for weakness.  Endo/Heme/Allergies: Bruises/bleeds easily.  Psychiatric/Behavioral: Negative.    Blood pressure 127/92, pulse (!) 127, temperature 97.9 F (36.6 C), temperature source Oral, resp. rate 18, last menstrual period 01/30/2005, SpO2 99 %. Physical Exam  Constitutional: She is oriented to person, place, and time.  Obese intubated white female having endoscopy for bleeding stomach  HENT:  Head: Normocephalic and atraumatic.  Eyes: Conjunctivae and EOM are normal. Pupils are equal, round, and reactive to light.  Neck: Normal range of motion. Neck supple.  Cardiovascular:  tachycardic  Respiratory:  intubated  GI: Soft.  Musculoskeletal: Normal range of motion.  Neurological: She is alert and oriented to person, place, and time.  Skin: Skin is warm  and dry.  Psychiatric: She has a normal mood and affect. Her behavior is normal.    Assessment/Plan: The patient has poorly controlled bleeding from the stomach with underlying metastatic breast cancer diffusely throughout the liver. Family has elected for comfort care.  TOTH III,PAUL S 11/23/2016, 2:45 PM

## 2016-11-23 NOTE — Telephone Encounter (Signed)
Pt presented to [facility] ED via EMS for evaluation and treatment.

## 2016-11-23 NOTE — ED Notes (Addendum)
1249: time-out performed  12:50: stopped sandostatin 12:53: started levophed at 3ml/hr 12:59: titrated levophed at 47ml/hr 13:00: titrated levophed at 41ml/hr 13:04: titrated levophed at 94ml/hr 13:06: stopped levophed 13:07: 30mg  of etomidate administered 13:08: 100mg  of rocuronium bromide administered 13:09: restarted levophed at 16ml/hr 13:10: tube placed; 7.59mm tube; 25cm at teeth 13:10: + color change 13:10: auscultation performed by Velna Hatchet, NP 13:14: titrated levophed at 27ml/hr

## 2016-11-23 NOTE — ED Provider Notes (Signed)
[facility] DEPT Provider Note   CSN: [id number] Arrival date & time: 11/23/16  6803     History   Chief Complaint Chief Complaint  Patient presents with  . Hematemesis    HPI Alexandria [name] is a 66 y.o. female.  H/o metastatic breast cancer with mets to the liver s/p lap adhesionolysis and liver biopsy on 2/16 , and hiatal hernia s/p Nissen 2/16. No anticoagulation.    The history is provided by the patient.  GI Problem  This is a new problem. The current episode started 6 to 12 hours ago. The problem occurs hourly. The problem has not changed since onset.Associated symptoms include abdominal pain. Pertinent negatives include no chest pain, no headaches and no shortness of breath. Nothing aggravates the symptoms. Nothing relieves the symptoms.   0915: Pt requested not to have intubation performed if needed. She is uncertain about cardiac resuscitation and would like to speak with her husband first before making that decision. Willing to receive blood transfusion of needed.    Past Medical History:  Diagnosis Date  . Anxiety   . Breast cancer ([place]) 10/02/12   Invasive Ductal Carcinoma of the Right Upper Outer Quadrant - ER (>90%), PR - Neg., Her2 Neu Negative, Ki-67 Unknown  . Chronic back pain    "all the way up and down"  . Chronic bronchitis ([place])    "get it q yr" (09/07/2014)  . Complication of anesthesia    "I come out really anxious; need Ativan to ease me out" (09/07/2014)  . Daily headache    "recently" (09/07/2014)  . DDD (degenerative disc disease), cervical   . DDD (degenerative disc disease), lumbosacral   . DDD (degenerative disc disease), thoracolumbar   . Diastolic dysfunction   . Diastolic heart failure ([place])   . Fibromyalgia   . GAD (generalized anxiety disorder)   . GERD (gastroesophageal reflux disease)   . H/O hiatal hernia   . Heart murmur   . History of blood transfusion "3-4"   "never can tell why; don't know where the blood was coming from;  doesn't show up in stool or urine"  . Hx of pulmonary embolus    During c-Section  . Hx of radiation therapy 03/04/13- 04/17/13   right breast 50 Gy 25 fractions, right breast boost 10 Gy 5 fractions  . Hyperlipidemia   . Hypertension   . Iron deficiency anemia   . Lymphedema    bilateral lower extremity  . Obesity    Class 2  . Peripheral neuropathic pain   . Pneumonia    "at least twice/yr" (09/07/2014)  . PONV (postoperative nausea and vomiting)   . Recurrent major depression ([place]) 06/2014   Seen by Dr. Louretta Shorten  . S/P radiation therapy  03/04/2013-04/17/2013   1) Right breast / 50 Gy in 25 fractions/ 2) Right breast boost / 10 Gy in 5 fractions  . Status post chemotherapy    4 cycles of Taxotere and cytoxan    Patient Active Problem List   Diagnosis Date Noted  . Breast cancer metastasized to liver, unspecified laterality ([place]) 11/11/2016  . Status post laparoscopic Nissen fundoplication 21/22/4825  . Asthma 08/21/2016  . Malignant neoplasm of upper-outer quadrant of female breast ([place])   . Hyperlipidemia 10/23/2014  . Essential hypertension   . Lymphedema 09/07/2014  . Nocturnal hypoxemia 08/18/2014  . Ventral incisional hernia with obstruction 07/05/2014  . Coronary artery calcification 05/22/2014  . Mediastinal lymphadenopathy 01/29/2014  . Hiatal hernia 01/23/2014  .  Symptomatic anemia 11/19/2013  . Hx of radiation therapy   . SVT (supraventricular tachycardia) ([place]) 04/19/2013  . Vocal cord dysfunction 04/15/2013  . Acute on chronic diastolic CHF (congestive heart failure) ([place]) 04/14/2013  . Anxiety state 04/14/2013  . Hypokalemia 04/13/2013  . Iron deficiency anemia 04/13/2013  . Hypertension 04/08/2013  . Chronic back pain 04/08/2013  . Morbid obesity ([place]) 04/08/2013  . Adjustment disorder with mixed anxiety and depressed mood 04/08/2013  . Lower extremity edema 04/08/2013  . Systolic murmur 35/46/5681  . Chronic constipation 04/08/2013  . Breast  cancer, right breast ([place]) 02/21/2013    Past Surgical History:  Procedure Laterality Date  . ABDOMINAL HYSTERECTOMY    . APPENDECTOMY    . BREAST BIOPSY Right   . BREAST LUMPECTOMY Right   . CESAREAN SECTION     [phone]  . DILATION AND CURETTAGE OF UTERUS  "numerous"  . FOOT SURGERY Right ~ 2013 X 3   "put pins in but pins kept breaking"  . HIATAL HERNIA REPAIR N/A 11/03/2016   Procedure: LAPAROSCOPIC NISSEN AND  REPAIR OF HIATAL HERNIA,;  Surgeon: Johnathan Hausen, MD;  Location: [facility] ORS;  Service: General;  Laterality: N/A;  . INGUINAL HERNIA REPAIR Bilateral    "cancer"  . PARTIAL COLECTOMY N/A 07/04/2014   Procedure: REPAIR OF INCARCERATED INCISIONAL HERNIA WITH MESH;  Surgeon: Excell Seltzer, MD;  Location: [facility];  Service: General;  Laterality: N/A;  . TONSILLECTOMY    . UMBILICAL HERNIA REPAIR  X 2    OB History    No data available       Home Medications    Prior to Admission medications   Medication Sig Start Date End Date Taking? Authorizing Provider  albuterol (PROVENTIL HFA;VENTOLIN HFA) 108 (90 Base) MCG/ACT inhaler Inhale 2 puffs into the lungs every 6 (six) hours as needed for wheezing or shortness of breath. 04/22/16   Darreld Mclean, MD  anastrozole (ARIMIDEX) 1 MG tablet TAKE 1 TABLET(1 MG) BY MOUTH DAILY 10/27/16   Marletta Lor, MD  atorvastatin (LIPITOR) 20 MG tablet Take 1 tablet (20 mg total) by mouth daily. 10/27/16   Marletta Lor, MD  Biotin w/ Vitamins C & E (HAIR/SKIN/NAILS PO) Take 1 tablet by mouth 3 (three) times daily.    Historical Provider, MD  carvedilol (COREG) 3.125 MG tablet Take 1 tablet (3.125 mg total) by mouth 2 (two) times daily with a meal. 11/11/16   Oswald Hillock, MD  docusate sodium (COLACE) 100 MG capsule Take 1 capsule (100 mg total) by mouth daily. 11/11/16 12/11/16  Clovis Riley, MD  docusate sodium 100 MG CAPS Take 100 mg by mouth 2 (two) times daily. Patient taking differently: Take 100 mg by mouth 2 (two)  times daily as needed (constipation).  07/10/14   Nat Christen, PA-C  EMBEDA 50-2 MG CPCR Take 1 capsule by mouth daily. 10/05/16   Historical Provider, MD  escitalopram (LEXAPRO) 10 MG tablet TAKE 1 TABLET BY MOUTH TWICE DAILY 08/25/16   Marletta Lor, MD  furosemide (LASIX) 40 MG tablet Take 1 tablet (40 mg total) by mouth 2 (two) times daily. 11/11/16   Oswald Hillock, MD  lidocaine (LIDODERM) 5 % Place 1 patch onto the skin every 12 (twelve) hours. Remove & Discard patch within 12 hours or as directed by MD    Historical Provider, MD  lisinopril (PRINIVIL,ZESTRIL) 5 MG tablet Take 1 tablet (5 mg total) by mouth daily. 10/27/16   Collier Salina  Sherwood Gambler, MD  LORazepam (ATIVAN) 0.5 MG tablet Take 1 tablet (0.5 mg total) by mouth every 8 (eight) hours as needed. for anxiety Patient taking differently: Take 0.5 mg by mouth 3 (three) times daily.  05/19/16   Marletta Lor, MD  lubiprostone (AMITIZA) 24 MCG capsule Take 1 capsule (24 mcg total) by mouth 2 (two) times daily with a meal. 03/15/16   Marletta Lor, MD  metolazone (ZAROXOLYN) 2.5 MG tablet Take 1 tablet (2.5 mg total) by mouth daily as needed (fluid gain). 04/27/16   Thurnell Lose, MD  montelukast (SINGULAIR) 5 MG chewable tablet Chew 1 tablet (5 mg total) by mouth at bedtime. 07/05/16   Javier Glazier, MD  Multiple Vitamin (MULTIVITAMIN WITH MINERALS) TABS tablet Take 1 tablet by mouth daily.    Historical Provider, MD  potassium chloride SA (K-DUR,KLOR-CON) 20 MEQ tablet Take 1 tablet (20 mEq total) by mouth daily. 11/11/16   Oswald Hillock, MD  ranitidine (ZANTAC) 150 MG tablet Take 1 tablet (150 mg total) by mouth at bedtime. Patient taking differently: Take 150 mg by mouth 2 (two) times daily.  05/31/15   Javier Glazier, MD    Family History Family History  Problem Relation Age of Onset  . Parkinson's disease Mother   . Emphysema Maternal Grandfather   . COPD Maternal Grandfather   . COPD Sister   . Heart attack Sister    . COPD Maternal Grandmother   . CAD Neg Hx     Social History Social History  Substance Use Topics  . Smoking status: Never Smoker  . Smokeless tobacco: Never Used     Comment: Through father  . Alcohol use No     Allergies   Contrast media [iodinated diagnostic agents]; Solu-medrol [methylprednisolone acetate]; Albuterol; and Prednisone   Review of Systems Review of Systems  Respiratory: Negative for shortness of breath.   Cardiovascular: Negative for chest pain.  Gastrointestinal: Positive for abdominal pain.  Neurological: Negative for headaches.  Ten systems are reviewed and are negative for acute change except as noted in the HPI    Physical Exam Updated Vital Signs BP 113/69 (BP Location: Left Arm)   Pulse 109   Temp 97.9 F (36.6 C) (Oral)   Resp 20   LMP 01/30/2005   SpO2 96%   Physical Exam  Constitutional: She is oriented to person, place, and time. She appears well-developed and well-nourished. She appears ill. Nasal cannula in place.  HENT:  Head: Normocephalic and atraumatic.  Nose: Nose normal.  Mouth/Throat: Mucous membranes are dry.  Dried blood on lips  Eyes: Conjunctivae and EOM are normal. Pupils are equal, round, and reactive to light. Right eye exhibits no discharge. Left eye exhibits no discharge. No scleral icterus.  Neck: Normal range of motion. Neck supple.  Cardiovascular: Normal rate and regular rhythm.  Exam reveals no gallop and no friction rub.   No murmur heard. Pulmonary/Chest: Effort normal and breath sounds normal. No stridor. No respiratory distress. She has no rales.  Abdominal: Soft. She exhibits distension. There is generalized tenderness. There is rebound. There is no rigidity, no guarding and no CVA tenderness.  Musculoskeletal: She exhibits no edema or tenderness.  Neurological: She is alert and oriented to person, place, and time.  Skin: Skin is warm and dry. No rash noted. She is not diaphoretic. No erythema.    Psychiatric: She has a normal mood and affect.  Vitals reviewed.    ED Treatments / Results  Labs (all labs ordered are listed, but only abnormal results are displayed) Labs Reviewed  COMPREHENSIVE METABOLIC PANEL  CBC WITH DIFFERENTIAL/PLATELET  PROTIME-INR  LIPASE, BLOOD  I-STAT CHEM 8, ED  I-STAT CG4 LACTIC ACID, ED  TYPE AND SCREEN    EKG  EKG Interpretation None       Radiology No results found.  Procedures Procedure Name: Intubation Date/Time: 11/23/2016 12:15 PM Performed by: Fatima Blank Pre-anesthesia Checklist: Emergency Drugs available, Patient identified, Patient being monitored, Timeout performed and Suction available Oxygen Delivery Method: Nasal cannula Preoxygenation: Pre-oxygenation with 100% oxygen Intubation Type: Rapid sequence Ventilation: Mask ventilation without difficulty Laryngoscope Size: Glidescope Tube size: 7.5 mm Number of attempts: 1 Airway Equipment and Method: Stylet Placement Confirmation: ETT inserted through vocal cords under direct vision and CO2 detector Secured at: 24 cm Tube secured with: ETT holder Difficulty Due To: Difficulty was unanticipated      (including critical care time) CRITICAL CARE Performed by: Grayce Sessions [place] Total critical care time: 45 minutes Critical care time was exclusive of separately billable procedures and treating other patients. Critical care was necessary to treat or prevent imminent or life-threatening deterioration. Critical care was time spent personally by me on the following activities: development of treatment plan with patient and/or surrogate as well as nursing, discussions with consultants, evaluation of patient's response to treatment, examination of patient, obtaining history from patient or surrogate, ordering and performing treatments and interventions, ordering and review of laboratory studies, ordering and review of radiographic studies, pulse oximetry and  re-evaluation of patient's condition.   Medications Ordered in ED Medications  octreotide (SANDOSTATIN) 2 mcg/mL load via infusion 50 mcg (not administered)    And  octreotide (SANDOSTATIN) 500 mcg in sodium chloride 0.9 % 250 mL (2 mcg/mL) infusion (not administered)  pantoprazole (PROTONIX) injection 40 mg (not administered)  ondansetron (ZOFRAN) injection 4 mg (not administered)  sodium chloride 0.9 % bolus 1,000 mL (not administered)    And  0.9 %  sodium chloride infusion (not administered)  fentaNYL (SUBLIMAZE) injection 50 mcg (not administered)     Initial Impression / Assessment and Plan / ED Course  I have reviewed the triage vital signs and the nursing notes.  Pertinent labs & imaging results that were available during my care of the patient were reviewed by me and considered in my medical decision making (see chart for details).  Clinical Course as of Nov 23 1617  Thu Nov 23, 2016  0930 Gross hematemesis. Patient with recent Niesen for hiatal hernia. Known to have metastatic breast cancer with metastases to the liver with a recent biopsy. Unknown source of bleed at this time however given her recent surgery concern for ulcer. Given his significant abdominal tenderness,  possible perforated ulcer versus small bowel obstruction. Patient has a allergy to IV contrast and given her hematemesis will not be able to provide patient with oral contrast either. Noncontrast CT of the abdomen will be obtained. We will initiate IV fluids, octreotide and Protonix.  [PC]  [id number] The patient is noted to have an elevated lactate. With the current information available to me, I don't think the patient is in septic. The lactate, is likely related to acute blood loss. Lactic Acid, Venous: (!!) 2.73 [PC]  1015 Hemoglobin stable. Leukocytosis noted. Still pending other workup.  [PC]  1100 CT without evidence of obstruction. Did note diffuse intestinal wall thickness surrounding the cecum concerning for  colitis. Empiric Cipro and Flagyl started.  [PC]  3536 [id number] to  hospitalist for admission. General surgery consulted and currently still awaiting their call. Gastroenterology evaluation or possible EGD.  [PC]  2023 XIDHWY to bedside for intubation. Patient and husband had changes her mind regarding their patient's DO NOT INTUBATE status. At this time critical care had been contacted for admission to the ICU. Asian intubated by me and care transferred to critical care  [PC]    Clinical Course User Index [PC] Fatima Blank, MD      Final Clinical Impressions(s) / ED Diagnoses   Final diagnoses:  Acute upper GI bleed  Leukocytosis, unspecified type  Encounter for intubation      Fatima Blank, MD 11/23/16 1621

## 2016-11-23 NOTE — Progress Notes (Signed)
eLink Physician-Brief Progress Note Patient Name: Alexandria [name] DOB: 06/21/1951 MRN: [id number]   Date of Service  11/23/2016  HPI/Events of Note  Patient seen by intensivist. DO NOT RESUSCITATE. Intubated for procedure.   eICU Interventions  Care plan as per intensivist and consultants.      Intervention Category Evaluation Type: New Patient Evaluation  Tera Partridge 11/23/2016, 4:34 PM

## 2016-11-23 NOTE — ED Notes (Signed)
Abnormal lab result MD Cardarma and RN Loma Sousa have been made aware

## 2016-11-23 NOTE — Consult Note (Signed)
Referring Provider: [facility]  Primary Care Physician:  Nyoka Cowden, MD Primary Gastroenterologist:   None. Unassigned  Reason for Consultation:  GI bleed  ASSESSMENT AND PLAN:   69. 66 yo female with hemodynamically unstable upper GI bleeding. She is s/p Nissen fundoplication mid February.  Actively vomiting large amounts of bright red blood she states since yesterday.  -Patient needs airway protection for an urgent EGD.  I spoke to the husband and consented him for the procedure. She is currently being intubated and CCM placing central line.  -ED has paged Surgery -PPI gtt is progress -Unfortunately she has several antibodies in her blood. She does have blood on the way here from [place].   2. Breast Cancer, s/p chemoxrt in 2014. Apparently felt to be in remission until recent liver biopsy during Nissen fundoplication showed metastatic disease.    3. Cirrhosis on CTscan today. Liver biopsy didn't show cirrhosis. Radiographic findings may represent metastatic disease and not actually cirrhosis.  4. Abnormal LFTs, possibly from liver mets   5. Colitis on CTscan. Will address once acute issues resolved and I can obtain history from patient.     HPI: Alexandria [name] is a 66 y.o. female with multiple medical problems. She has chronic anemia with negative workup in [place] around 2014. Per husband she was evaluated in [place] before that. She is followed by Heme/Onc for iron deficiency anemia as well as breast cancer. 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 Marin Olp.   Patient had a Nissen mid Feb. Liver looked abnormal, biopsies showed metastatic breast cancer.   Unable to obtain much history from her. She is alert for a few seconds at a time. She began vomiting blood yesterday.    Past Medical History:  Diagnosis Date  . Anxiety   . Breast  cancer ([place]) 10/02/12   Invasive Ductal Carcinoma of the Right Upper Outer Quadrant - ER (>90%), PR - Neg., Her2 Neu Negative, Ki-67 Unknown  . Chronic back pain    "all the way up and down"  . Chronic bronchitis ([place])    "get it q yr" (09/07/2014)  . Complication of anesthesia    "I come out really anxious; need Ativan to ease me out" (09/07/2014)  . Daily headache    "recently" (09/07/2014)  . DDD (degenerative disc disease), cervical   . DDD (degenerative disc disease), lumbosacral   . DDD (degenerative disc disease), thoracolumbar   . Diastolic dysfunction   . Diastolic heart failure ([place])   . Fibromyalgia   . GAD (generalized anxiety disorder)   . GERD (gastroesophageal reflux disease)   . H/O hiatal hernia   . Heart murmur   . History of blood transfusion "3-4"   "never can tell why; don't know where the blood was coming from; doesn't show up in stool or urine"  . Hx of pulmonary embolus    During c-Section  . Hx of radiation therapy 03/04/13- 04/17/13   right breast 50 Gy 25 fractions, right breast boost 10 Gy 5 fractions  . Hyperlipidemia   . Hypertension   . Iron deficiency anemia   . Lymphedema    bilateral lower extremity  . Obesity    Class 2  . Peripheral neuropathic pain   . Pneumonia    "at least twice/yr" (09/07/2014)  . PONV (postoperative nausea and vomiting)   . Recurrent major depression ([place])  06/2014   Seen by Dr. Louretta Shorten  . S/P radiation therapy  03/04/2013-04/17/2013   1) Right breast / 50 Gy in 25 fractions/ 2) Right breast boost / 10 Gy in 5 fractions  . Status post chemotherapy    4 cycles of Taxotere and cytoxan    Past Surgical History:  Procedure Laterality Date  . ABDOMINAL HYSTERECTOMY    . APPENDECTOMY    . BREAST BIOPSY Right   . BREAST LUMPECTOMY Right   . CESAREAN SECTION     [phone]  . DILATION AND CURETTAGE OF UTERUS  "numerous"  . FOOT SURGERY Right ~ 2013 X 3   "put pins in but pins kept breaking"  . HIATAL HERNIA  REPAIR N/A 11/03/2016   Procedure: LAPAROSCOPIC NISSEN AND  REPAIR OF HIATAL HERNIA,;  Surgeon: Johnathan Hausen, MD;  Location: [facility];  Service: General;  Laterality: N/A;  . INGUINAL HERNIA REPAIR Bilateral    "cancer"  . PARTIAL COLECTOMY N/A 07/04/2014   Procedure: REPAIR OF INCARCERATED INCISIONAL HERNIA WITH MESH;  Surgeon: Excell Seltzer, MD;  Location: [facility];  Service: General;  Laterality: N/A;  . TONSILLECTOMY    . UMBILICAL HERNIA REPAIR  X 2    Prior to Admission medications   Medication Sig Start Date End Date Taking? Authorizing Provider  albuterol (PROVENTIL HFA;VENTOLIN HFA) 108 (90 Base) MCG/ACT inhaler Inhale 2 puffs into the lungs every 6 (six) hours as needed for wheezing or shortness of breath. 04/22/16   Darreld Mclean, MD  anastrozole (ARIMIDEX) 1 MG tablet TAKE 1 TABLET(1 MG) BY MOUTH DAILY 10/27/16   Marletta Lor, MD  atorvastatin (LIPITOR) 20 MG tablet Take 1 tablet (20 mg total) by mouth daily. 10/27/16   Marletta Lor, MD  Biotin w/ Vitamins C & E (HAIR/SKIN/NAILS PO) Take 1 tablet by mouth 3 (three) times daily.    Historical Provider, MD  carvedilol (COREG) 3.125 MG tablet Take 1 tablet (3.125 mg total) by mouth 2 (two) times daily with a meal. 11/11/16   Oswald Hillock, MD  docusate sodium (COLACE) 100 MG capsule Take 1 capsule (100 mg total) by mouth daily. 11/11/16 12/11/16  Clovis Riley, MD  docusate sodium 100 MG CAPS Take 100 mg by mouth 2 (two) times daily. Patient taking differently: Take 100 mg by mouth 2 (two) times daily as needed (constipation).  07/10/14   Nat Christen, PA-C  EMBEDA 50-2 MG CPCR Take 1 capsule by mouth daily. 10/05/16   Historical Provider, MD  escitalopram (LEXAPRO) 10 MG tablet TAKE 1 TABLET BY MOUTH TWICE DAILY 08/25/16   Marletta Lor, MD  furosemide (LASIX) 40 MG tablet Take 1 tablet (40 mg total) by mouth 2 (two) times daily. 11/11/16   Oswald Hillock, MD  lidocaine (LIDODERM) 5 % Place 1 patch onto the skin every  12 (twelve) hours. Remove & Discard patch within 12 hours or as directed by MD    Historical Provider, MD  lisinopril (PRINIVIL,ZESTRIL) 5 MG tablet Take 1 tablet (5 mg total) by mouth daily. 10/27/16   Marletta Lor, MD  LORazepam (ATIVAN) 0.5 MG tablet Take 1 tablet (0.5 mg total) by mouth every 8 (eight) hours as needed. for anxiety Patient taking differently: Take 0.5 mg by mouth 3 (three) times daily.  05/19/16   Marletta Lor, MD  lubiprostone (AMITIZA) 24 MCG capsule Take 1 capsule (24 mcg total) by mouth 2 (two) times daily with a meal. 03/15/16   Doretha Sou  Burnice Logan, MD  metolazone (ZAROXOLYN) 2.5 MG tablet Take 1 tablet (2.5 mg total) by mouth daily as needed (fluid gain). 04/27/16   Thurnell Lose, MD  montelukast (SINGULAIR) 5 MG chewable tablet Chew 1 tablet (5 mg total) by mouth at bedtime. 07/05/16   Javier Glazier, MD  Multiple Vitamin (MULTIVITAMIN WITH MINERALS) TABS tablet Take 1 tablet by mouth daily.    Historical Provider, MD  potassium chloride SA (K-DUR,KLOR-CON) 20 MEQ tablet Take 1 tablet (20 mEq total) by mouth daily. 11/11/16   Oswald Hillock, MD  ranitidine (ZANTAC) 150 MG tablet Take 1 tablet (150 mg total) by mouth at bedtime. Patient taking differently: Take 150 mg by mouth 2 (two) times daily.  05/31/15   Javier Glazier, MD    Current Facility-Administered Medications  Medication Dose Route Frequency Provider Last Rate Last Dose  . 0.9 %  sodium chloride infusion   Intravenous Continuous Fatima Blank, MD 125 mL/hr at 11/23/16 1150    . ciprofloxacin (CIPRO) IVPB 400 mg  400 mg Intravenous Once Fatima Blank, MD      . metroNIDAZOLE (FLAGYL) IVPB 500 mg  500 mg Intravenous Once Fatima Blank, MD      . octreotide (SANDOSTATIN) 500 mcg in sodium chloride 0.9 % 250 mL (2 mcg/mL) infusion  50 mcg/hr Intravenous Continuous Fatima Blank, MD 25 mL/hr at 11/23/16 1054 50 mcg/hr at 11/23/16 1054  . ondansetron (ZOFRAN) injection 4  mg  4 mg Intravenous Q6H PRN Fatima Blank, MD   4 mg at 11/23/16 1043  . ondansetron (ZOFRAN) injection 4 mg  4 mg Intravenous Once Fatima Blank, MD      . pantoprazole (PROTONIX) 80 mg in sodium chloride 0.9 % 100 mL IVPB  80 mg Intravenous Once Fatima Blank, MD      . pantoprazole (PROTONIX) 80 mg in sodium chloride 0.9 % 250 mL (0.32 mg/mL) infusion  8 mg/hr Intravenous Continuous Fatima Blank, MD      . Derrill Memo ON 11/27/2016] pantoprazole (PROTONIX) injection 40 mg  40 mg Intravenous Q12H Fatima Blank, MD       Current Outpatient Prescriptions  Medication Sig Dispense Refill  . albuterol (PROVENTIL HFA;VENTOLIN HFA) 108 (90 Base) MCG/ACT inhaler Inhale 2 puffs into the lungs every 6 (six) hours as needed for wheezing or shortness of breath. 1 Inhaler 2  . anastrozole (ARIMIDEX) 1 MG tablet TAKE 1 TABLET(1 MG) BY MOUTH DAILY 90 tablet 1  . atorvastatin (LIPITOR) 20 MG tablet Take 1 tablet (20 mg total) by mouth daily. 90 tablet 1  . Biotin w/ Vitamins C & E (HAIR/SKIN/NAILS PO) Take 1 tablet by mouth 3 (three) times daily.    . carvedilol (COREG) 3.125 MG tablet Take 1 tablet (3.125 mg total) by mouth 2 (two) times daily with a meal. 60 tablet 2  . docusate sodium (COLACE) 100 MG capsule Take 1 capsule (100 mg total) by mouth daily. 30 capsule 2  . docusate sodium 100 MG CAPS Take 100 mg by mouth 2 (two) times daily. (Patient taking differently: Take 100 mg by mouth 2 (two) times daily as needed (constipation). ) 10 capsule 0  . EMBEDA 50-2 MG CPCR Take 1 capsule by mouth daily.  0  . escitalopram (LEXAPRO) 10 MG tablet TAKE 1 TABLET BY MOUTH TWICE DAILY 180 tablet 1  . furosemide (LASIX) 40 MG tablet Take 1 tablet (40 mg total) by mouth 2 (two) times daily. [place]  tablet 2  . lidocaine (LIDODERM) 5 % Place 1 patch onto the skin every 12 (twelve) hours. Remove & Discard patch within 12 hours or as directed by MD    . lisinopril (PRINIVIL,ZESTRIL) 5 MG  tablet Take 1 tablet (5 mg total) by mouth daily. 30 tablet 5  . LORazepam (ATIVAN) 0.5 MG tablet Take 1 tablet (0.5 mg total) by mouth every 8 (eight) hours as needed. for anxiety (Patient taking differently: Take 0.5 mg by mouth 3 (three) times daily. ) 270 tablet 1  . lubiprostone (AMITIZA) 24 MCG capsule Take 1 capsule (24 mcg total) by mouth 2 (two) times daily with a meal. 60 capsule 2  . metolazone (ZAROXOLYN) 2.5 MG tablet Take 1 tablet (2.5 mg total) by mouth daily as needed (fluid gain).    . montelukast (SINGULAIR) 5 MG chewable tablet Chew 1 tablet (5 mg total) by mouth at bedtime. 30 tablet 3  . Multiple Vitamin (MULTIVITAMIN WITH MINERALS) TABS tablet Take 1 tablet by mouth daily.    . potassium chloride SA (K-DUR,KLOR-CON) 20 MEQ tablet Take 1 tablet (20 mEq total) by mouth daily. 270 tablet 1  . ranitidine (ZANTAC) 150 MG tablet Take 1 tablet (150 mg total) by mouth at bedtime. (Patient taking differently: Take 150 mg by mouth 2 (two) times daily. ) 30 tablet 3    Allergies as of 11/23/2016 - Review Complete 11/23/2016  Allergen Reaction Noted  . Contrast media [iodinated diagnostic agents] Anaphylaxis 02/19/2013  . Solu-medrol [methylprednisolone acetate] Other (See Comments) 04/27/2016  . Albuterol Other (See Comments) 04/14/2013  . Prednisone Other (See Comments) 04/13/2013    Family History  Problem Relation Age of Onset  . Parkinson's disease Mother   . Emphysema Maternal Grandfather   . COPD Maternal Grandfather   . COPD Sister   . Heart attack Sister   . COPD Maternal Grandmother   . CAD Neg Hx     Social History   Social History  . Marital status: Married    Spouse name: N/A  . Number of children: N/A  . Years of education: N/A   Occupational History  . retired Therapist, sports    Social History Main Topics  . Smoking status: Never Smoker  . Smokeless tobacco: Never Used     Comment: Through father  . Alcohol use No  . Drug use: No  . Sexual activity: Not  Currently   Other Topics Concern  . Not on file   Social History Narrative   Originally from [place], [place]. Previously lived in [place]. [place] Rank to [facility] in 2014. Previously worked as an Warden/ranger. No known TB exposure. No pets currently. No bird exposure. Minimal mold exposure.     Review of Systems: All systems reviewed and negative except where noted in HPI.  Physical Exam: Vital signs in last 24 hours: Temp:  [97.9 F (36.6 C)] 97.9 F (36.6 C) (03/08 0920) Pulse Rate:  [109-110] 110 (03/08 1058) Resp:  [20] 20 (03/08 1058) BP: (113-133)/(69-98) 133/98 (03/08 1058) SpO2:  [90 %-99 %] 97 % (03/08 1058)   General:   Obese white female who is partially alert.  Eyes:  Sclera clear, no icterus.   Conjunctiva pale. Ears:  Normal auditory acuity. Nose:  No deformity, discharge,  or lesions. Mouth:  No deformity or lesions.   Neck:  Supple; no masses. Lungs:  Clear throughout to auscultation.   No wheezes, crackles, or rhonchi.  Heart:  Tachy in 130s. . Abdomen:  Soft, obese, mild  diffuse tenderness.  BS active Rectal:  Deferred  Msk:  Symmetrical without gross deformities. . Pulses:  Normal pulses noted. Extremities:  Without clubbing or edema. Neurologic:  In and out of alertness. Confused about year.  Skin:  Intact without significant lesions or rashes.. Psych:  Alert and cooperative. Normal mood and affect.  Intake/Output from previous day: No intake/output data recorded. Intake/Output this shift: Total I/O In: 1000 [IV Piggyback:1000] Out: -   Lab Results:  Recent Labs  11/23/16 0944 11/23/16 0953  WBC 12.8*  --   HGB 11.0* 12.2  HCT 34.2* 36.0  PLT 300  --    BMET  Recent Labs  11/23/16 0944 11/23/16 0953  NA 139 141  K 4.6 4.6  CL 109 108  CO2 23  --   GLUCOSE 127* 132*  BUN 18 18  CREATININE 0.86 0.80  CALCIUM 8.2*  --    LFT  Recent Labs  11/23/16 0944  PROT 5.9*  ALBUMIN 2.4*  AST 154*  ALT 60*  ALKPHOS 244*  BILITOT 3.3*    PT/INR  Recent Labs  11/23/16 0944  LABPROT 19.9*  INR 1.67   Studies/Results: Ct Abdomen Pelvis Wo Contrast  Result Date: 11/23/2016 CLINICAL DATA:  Abdominal pain. Breast cancer, metastatic to the liver EXAM: CT ABDOMEN AND PELVIS WITHOUT CONTRAST TECHNIQUE: Multidetector CT imaging of the abdomen and pelvis was performed following the standard protocol without IV contrast. COMPARISON:  PET CT 11/15/2016 FINDINGS: Lower chest: Small left pleural effusion, slightly increased in size since prior PET CT. Bibasilar opacities, likely atelectasis. Heart is normal size. Small hiatal hernia. Hepatobiliary: Changes of cirrhosis. Previously seen extensive metastatic disease throughout the liver is difficult to appreciate on this unenhanced study. Gallbladder unremarkable. Pancreas: No focal abnormality or ductal dilatation. Spleen: No focal abnormality.  Normal size. Adrenals/Urinary Tract: No adrenal abnormality. No focal renal abnormality. No stones or hydronephrosis. Urinary bladder is unremarkable. Stomach/Bowel: No evidence of aneurysm or adenopathy. Scattered aortic calcifications Vascular/Lymphatic: There is wall thickening within the cecum and ascending colon to the level of the proximal transverse colon. This is concerning for colitis. Stomach and small bowel decompressed. Reproductive: Prior hysterectomy.  No adnexal masses. Other: Small amount of free fluid adjacent to the liver. No free air. Musculoskeletal: No acute bony abnormality. IMPRESSION: Changes of cirrhosis. Previously demonstrated metastatic disease to the liver is difficult to visualize on this unenhanced study. Small amount of perihepatic ascites.  Small left pleural effusion. Wall thickening and surrounding inflammatory stranding involving the cecum, ascending colon and proximal transverse colon concerning for colitis. Small hiatal hernia. Bibasilar atelectasis. Electronically Signed   By: Rolm Baptise M.D.   On: 11/23/2016 10:21     Tye Savoy, NP-C @  11/23/2016, 11:56 AM  Pager number [phone]

## 2016-11-24 ENCOUNTER — Other Ambulatory Visit: Payer: Medicare Other

## 2016-11-24 ENCOUNTER — Inpatient Hospital Stay (HOSPITAL_COMMUNITY): Payer: Medicare Other

## 2016-11-24 ENCOUNTER — Ambulatory Visit: Payer: Medicare Other | Admitting: Hematology & Oncology

## 2016-11-24 DIAGNOSIS — C7951 Secondary malignant neoplasm of bone: Secondary | ICD-10-CM

## 2016-11-24 DIAGNOSIS — C50919 Malignant neoplasm of unspecified site of unspecified female breast: Secondary | ICD-10-CM

## 2016-11-24 DIAGNOSIS — D72829 Elevated white blood cell count, unspecified: Secondary | ICD-10-CM

## 2016-11-24 DIAGNOSIS — C787 Secondary malignant neoplasm of liver and intrahepatic bile duct: Secondary | ICD-10-CM

## 2016-11-24 DIAGNOSIS — J9601 Acute respiratory failure with hypoxia: Secondary | ICD-10-CM

## 2016-11-24 DIAGNOSIS — K920 Hematemesis: Secondary | ICD-10-CM

## 2016-11-24 LAB — CBC
HCT: 23.4 % — ABNORMAL LOW (ref 36.0–46.0)
HCT: 22.9 % — ABNORMAL LOW (ref 36.0–46.0)
HEMATOCRIT: 24.9 % — AB (ref 36.0–46.0)
HEMATOCRIT: 25.3 % — AB (ref 36.0–46.0)
HEMATOCRIT: 22.8 % — AB (ref 36.0–46.0)
HEMOGLOBIN: 8.2 g/dL — AB (ref 12.0–15.0)
HEMOGLOBIN: 7.9 g/dL — AB (ref 12.0–15.0)
HEMOGLOBIN: 7.8 g/dL — AB (ref 12.0–15.0)
Hemoglobin: 8.3 g/dL — ABNORMAL LOW (ref 12.0–15.0)
Hemoglobin: 7.5 g/dL — ABNORMAL LOW (ref 12.0–15.0)
MCH: 27.3 pg (ref 26.0–34.0)
MCH: 28 pg (ref 26.0–34.0)
MCH: 28.1 pg (ref 26.0–34.0)
MCH: 26.9 pg (ref 26.0–34.0)
MCH: 28.2 pg (ref 26.0–34.0)
MCHC: 32.4 g/dL (ref 30.0–36.0)
MCHC: 33.3 g/dL (ref 30.0–36.0)
MCHC: 33.8 g/dL (ref 30.0–36.0)
MCHC: 32.9 g/dL (ref 30.0–36.0)
MCHC: 34.1 g/dL (ref 30.0–36.0)
MCV: 84.1 fL (ref 78.0–100.0)
MCV: 84.3 fL (ref 78.0–100.0)
MCV: 83.3 fL (ref 78.0–100.0)
MCV: 81.7 fL (ref 78.0–100.0)
MCV: 82.7 fL (ref 78.0–100.0)
PLATELETS: 168 10*3/uL (ref 150–400)
Platelets: 169 10*3/uL (ref 150–400)
Platelets: 170 10*3/uL (ref 150–400)
Platelets: 171 10*3/uL (ref 150–400)
Platelets: 167 10*3/uL (ref 150–400)
RBC: 2.96 MIL/uL — ABNORMAL LOW (ref 3.87–5.11)
RBC: 3 MIL/uL — AB (ref 3.87–5.11)
RBC: 2.81 MIL/uL — AB (ref 3.87–5.11)
RBC: 2.79 MIL/uL — ABNORMAL LOW (ref 3.87–5.11)
RBC: 2.77 MIL/uL — AB (ref 3.87–5.11)
RDW: 16.9 % — ABNORMAL HIGH (ref 11.5–15.5)
RDW: 17.1 % — ABNORMAL HIGH (ref 11.5–15.5)
RDW: 17.2 % — ABNORMAL HIGH (ref 11.5–15.5)
RDW: 17.1 % — AB (ref 11.5–15.5)
RDW: 17.2 % — ABNORMAL HIGH (ref 11.5–15.5)
WBC: 9.2 10*3/uL (ref 4.0–10.5)
WBC: 9.4 10*3/uL (ref 4.0–10.5)
WBC: 8.9 10*3/uL (ref 4.0–10.5)
WBC: 7.2 10*3/uL (ref 4.0–10.5)
WBC: 7.2 10*3/uL (ref 4.0–10.5)

## 2016-11-24 LAB — BPAM FFP
BLOOD PRODUCT EXPIRATION DATE: 201803132359
BLOOD PRODUCT EXPIRATION DATE: 201803132359
Blood Product Expiration Date: 201803132359
Blood Product Expiration Date: 201803132359
Blood Product Expiration Date: 201803132359
ISSUE DATE / TIME: 201803082128
ISSUE DATE / TIME: 201803081715
ISSUE DATE / TIME: 201803082030
ISSUE DATE / TIME: 201803082338
UNIT TYPE AND RH: 5100
UNIT TYPE AND RH: 5100
Unit Type and Rh: 5100
Unit Type and Rh: 5100
Unit Type and Rh: 5100

## 2016-11-24 LAB — PREPARE FRESH FROZEN PLASMA
UNIT DIVISION: 0
UNIT DIVISION: 0
UNIT DIVISION: 0
Unit division: 0
Unit division: 0

## 2016-11-24 LAB — BLOOD GAS, ARTERIAL
Acid-base deficit: 0.4 mmol/L (ref 0.0–2.0)
Bicarbonate: 23.9 mmol/L (ref 20.0–28.0)
Drawn by: 308601
FIO2: 40
LHR: 18 {breaths}/min
MECHVT: 500 mL
O2 SAT: 95.8 %
PEEP: 5 cmH2O
Patient temperature: 98.6
pCO2 arterial: 39.9 mmHg (ref 32.0–48.0)
pH, Arterial: 7.394 (ref 7.350–7.450)
pO2, Arterial: 80.9 mmHg — ABNORMAL LOW (ref 83.0–108.0)

## 2016-11-24 LAB — PROTIME-INR
INR: 1.74
PROTHROMBIN TIME: 20.5 s — AB (ref 11.4–15.2)

## 2016-11-24 LAB — BASIC METABOLIC PANEL
Anion gap: 7 (ref 5–15)
BUN: 21 mg/dL — AB (ref 6–20)
CHLORIDE: 114 mmol/L — AB (ref 101–111)
CO2: 23 mmol/L (ref 22–32)
Calcium: 8.3 mg/dL — ABNORMAL LOW (ref 8.9–10.3)
Creatinine, Ser: 0.85 mg/dL (ref 0.44–1.00)
GFR calc Af Amer: >60 mL/min (ref >60)
GFR calc non Af Amer: >60 mL/min (ref >60)
GLUCOSE: 91 mg/dL (ref 65–99)
POTASSIUM: 4.6 mmol/L (ref 3.5–5.1)
SODIUM: 144 mmol/L (ref 135–145)

## 2016-11-24 LAB — PHOSPHORUS: Phosphorus: 2.8 mg/dL (ref 2.5–4.6)

## 2016-11-24 LAB — MAGNESIUM: MAGNESIUM: 1.8 mg/dL (ref 1.7–2.4)

## 2016-11-24 MED ORDER — SODIUM CHLORIDE 0.9 % IV SOLN
Freq: Once | INTRAVENOUS | Status: AC
  Administered 2016-11-24: 17:00:00 via INTRAVENOUS

## 2016-11-24 MED ORDER — ORAL CARE MOUTH RINSE
15.0000 mL | Freq: Four times a day (QID) | OROMUCOSAL | Status: DC
  Administered 2016-11-24 – 2016-12-03 (×27): 15 mL via OROMUCOSAL

## 2016-11-24 MED ORDER — CHLORHEXIDINE GLUCONATE 0.12% ORAL RINSE (MEDLINE KIT)
15.0000 mL | Freq: Two times a day (BID) | OROMUCOSAL | Status: DC
  Administered 2016-11-24 – 2016-12-01 (×13): 15 mL via OROMUCOSAL

## 2016-11-24 MED ORDER — DEXTROSE 5 % IV SOLN
0.0000 ug/min | INTRAVENOUS | Status: DC
  Filled 2016-11-24: qty 4

## 2016-11-24 MED ORDER — MAGNESIUM SULFATE 2 GM/50ML IV SOLN
2.0000 g | Freq: Once | INTRAVENOUS | Status: AC
  Administered 2016-11-24: 2 g via INTRAVENOUS
  Filled 2016-11-24: qty 50

## 2016-11-24 MED ORDER — DEXTROSE 5 % IV SOLN
2.0000 g | INTRAVENOUS | Status: DC
  Administered 2016-11-24 – 2016-11-25 (×2): 2 g via INTRAVENOUS
  Filled 2016-11-24 (×3): qty 2

## 2016-11-24 NOTE — Progress Notes (Signed)
eLink Physician-Brief Progress Note Patient Name: Alexandria [name] DOB: [birth year] MRN: [id number]   Date of Service  11/24/2016  HPI/Events of Note  INR 1.7, Hb trending down  eICU Interventions  2 U FFP     Intervention Category Intermediate Interventions: Bleeding - evaluation and treatment with blood products  Danyeal Akens V. 11/24/2016, 3:52 PM

## 2016-11-24 NOTE — Progress Notes (Addendum)
Alexandria [name]   DOB:[birth year]   OI#:[id number]   QXI#:[id number]  Medical oncology follow-up note  Subjective: Patient was seen by my colleague Dr. Marin Olp in the past, she was admitted for severe GI bleeding yesterday. Family requested oncology consult to discuss her newly diagnosed metastatic breast cancer to liver. Patient is still intubated, sedated, not able to communicate. I called her son and spoke with him over the phone.   Objective:  Vitals:   11/24/16 1600 11/24/16 1700  BP: (!) 155/65 (!) 162/73  Pulse: 88 (!) 122  Resp: 18 18  Temp: 98.6 F (37 C) 99.2 F (37.3 C)    Body mass index is 40.07 kg/m.  Intake/Output Summary (Last 24 hours) at 11/24/16 1821 Last data filed at 11/24/16 1500  Gross per 24 hour  Intake          3640.01 ml  Output             1385 ml  Net          2255.01 ml    Patient is intubated, on the mechanical ventilation, NG tube has bloody secretions   Sclerae unicteric  No peripheral adenopathy  Lungs clear -- no rales or rhonchi  Heart regular rate and rhythm  Abdomen benign  MSK no focal spinal tenderness, no peripheral edema  Neuro: sedated     CBG (last 3)  No results for input(s): GLUCAP in the last 72 hours.   Labs:  Lab Results  Component Value Date   WBC 7.2 11/24/2016   HGB 7.5 (L) 11/24/2016   HCT 22.8 (L) 11/24/2016   MCV 81.7 11/24/2016   PLT 167 11/24/2016   NEUTROABS 9.6 (H) 11/23/2016   CMP Latest Ref Rng & Units 11/24/2016 11/23/2016 11/23/2016  Glucose 65 - 99 mg/dL 91 132(H) 127(H)  BUN 6 - 20 mg/dL 21(H) 18 18  Creatinine 0.44 - 1.00 mg/dL 0.85 0.80 0.86  Sodium 135 - 145 mmol/L 144 141 139  Potassium 3.5 - 5.1 mmol/L 4.6 4.6 4.6  Chloride 101 - 111 mmol/L 114(H) 108 109  CO2 22 - 32 mmol/L 23 - 23  Calcium 8.9 - 10.3 mg/dL 8.3(L) - 8.2(L)  Total Protein 6.5 - 8.1 g/dL - - 5.9(L)  Total Bilirubin 0.3 - 1.2 mg/dL - - 3.3(H)  Alkaline Phos 38 - 126 U/L - - 244(H)  AST 15 - 41 U/L - - 154(H)  ALT 14 - 54 U/L - -  60(H)     Urine Studies No results for input(s): UHGB, CRYS in the last 72 hours.  Invalid input(s): UACOL, UAPR, USPG, UPH, UTP, UGL, UKET, UBIL, UNIT, UROB, ULEU, UEPI, UWBC, [place]ala Duff[place], [place]  Basic Metabolic Panel:  Recent Labs Lab 11/23/16 0944 11/23/16 0953 11/24/16 0329  NA 139 141 144  K 4.6 4.6 4.6  CL 109 108 114*  CO2 23  --  23  GLUCOSE 127* 132* 91  BUN 18 18 21*  CREATININE 0.86 0.80 0.85  CALCIUM 8.2*  --  8.3*  MG  --   --  1.8  PHOS  --   --  2.8   GFR Estimated Creatinine Clearance: 86.2 mL/min (by C-G formula based on SCr of 0.85 mg/dL). Liver Function Tests:  Recent Labs Lab 11/23/16 0944  AST 154*  ALT 60*  ALKPHOS 244*  BILITOT 3.3*  PROT 5.9*  ALBUMIN 2.4*    Recent Labs Lab 11/23/16 0944  LIPASE 25   No results for input(s): AMMONIA  in the last 168 hours. Coagulation profile  Recent Labs Lab 11/23/16 0944 11/24/16 1430  INR 1.67 1.74    CBC:  Recent Labs Lab 11/23/16 0944  11/23/16 1638 11/23/16 1905 11/24/16 0329 11/24/16 0759 11/24/16 1430  WBC 12.8*  --  16.2* 20.5* 9.4  9.2 8.9 7.2  NEUTROABS 9.6*  --   --   --   --   --   --   HGB 11.0*  < > 9.7* 9.3* 8.2*  8.3* 7.9* 7.5*  HCT 34.2*  < > 30.2* 27.9* 25.3*  24.9* 23.4* 22.8*  MCV 84.0  --  86.0 85.3 84.3  84.1 83.3 81.7  PLT 300  --  339 283 170  169 171 167  < > = values in this interval not displayed. Cardiac Enzymes: No results for input(s): CKTOTAL, CKMB, CKMBINDEX, TROPONINI in the last 168 hours. BNP: Invalid input(s): POCBNP CBG: No results for input(s): GLUCAP in the last 168 hours. D-Dimer No results for input(s): DDIMER in the last 72 hours. Hgb A1c No results for input(s): HGBA1C in the last 72 hours. Lipid Profile No results for input(s): CHOL, HDL, LDLCALC, TRIG, CHOLHDL, LDLDIRECT in the last 72 hours. Thyroid function studies No results for input(s): TSH, T4TOTAL, T3FREE, THYROIDAB in the last 72 hours.  Invalid  input(s): FREET3 Anemia work up No results for input(s): VITAMINB12, FOLATE, FERRITIN, TIBC, IRON, RETICCTPCT in the last 72 hours. Microbiology Recent Results (from the past 240 hour(s))  MRSA PCR Screening     Status: None   Collection Time: 11/23/16  4:58 PM  Result Value Ref Range Status   MRSA by PCR NEGATIVE NEGATIVE Final    Comment:        The GeneXpert MRSA Assay (FDA approved for NASAL specimens only), is one component of a comprehensive MRSA colonization surveillance program. It is not intended to diagnose MRSA infection nor to guide or monitor treatment for MRSA infections.       Studies:  Ct Abdomen Pelvis Wo Contrast  Result Date: 11/23/2016 CLINICAL DATA:  Abdominal pain. Breast cancer, metastatic to the liver EXAM: CT ABDOMEN AND PELVIS WITHOUT CONTRAST TECHNIQUE: Multidetector CT imaging of the abdomen and pelvis was performed following the standard protocol without IV contrast. COMPARISON:  PET CT 11/15/2016 FINDINGS: Lower chest: Small left pleural effusion, slightly increased in size since prior PET CT. Bibasilar opacities, likely atelectasis. Heart is normal size. Small hiatal hernia. Hepatobiliary: Changes of cirrhosis. Previously seen extensive metastatic disease throughout the liver is difficult to appreciate on this unenhanced study. Gallbladder unremarkable. Pancreas: No focal abnormality or ductal dilatation. Spleen: No focal abnormality.  Normal size. Adrenals/Urinary Tract: No adrenal abnormality. No focal renal abnormality. No stones or hydronephrosis. Urinary bladder is unremarkable. Stomach/Bowel: No evidence of aneurysm or adenopathy. Scattered aortic calcifications Vascular/Lymphatic: There is wall thickening within the cecum and ascending colon to the level of the proximal transverse colon. This is concerning for colitis. Stomach and small bowel decompressed. Reproductive: Prior hysterectomy.  No adnexal masses. Other: Small amount of free fluid adjacent  to the liver. No free air. Musculoskeletal: No acute bony abnormality. IMPRESSION: Changes of cirrhosis. Previously demonstrated metastatic disease to the liver is difficult to visualize on this unenhanced study. Small amount of perihepatic ascites.  Small left pleural effusion. Wall thickening and surrounding inflammatory stranding involving the cecum, ascending colon and proximal transverse colon concerning for colitis. Small hiatal hernia. Bibasilar atelectasis. Electronically Signed   By: Rolm Baptise M.D.   On: 11/23/2016  10:21   Dg Chest Port 1 View  Result Date: 11/24/2016 CLINICAL DATA:  Acute respiratory failure EXAM: PORTABLE CHEST 1 VIEW COMPARISON:  11/23/2016 FINDINGS: The endotracheal tube has been repositioned and is 1 cm above the carina. The nasogastric tube extends into the stomach. The right jugular central line terminates in the low SVC. Left base consolidation persists without significant interval change. No pneumothorax. IMPRESSION: 1.  Support equipment appears satisfactorily positioned. 2. Unchanged left base consolidation. Electronically Signed   By: Andreas Newport M.D.   On: 11/24/2016 05:24   Dg Chest Portable 1 View  Result Date: 11/23/2016 CLINICAL DATA:  Post intubation. History of metastatic breast cancer, cirrhosis. EXAM: PORTABLE CHEST 1 VIEW COMPARISON:  Chest radiograph November 07, 2016 FINDINGS: Cardiac silhouette is mildly enlarged, improved from prior radiograph. Tortuous, mildly calcified aorta. Persistently elevated RIGHT hemidiaphragm with low lung volumes. Chronic interstitial changes with strandy densities in lung bases. No pleural effusion. No pneumothorax. RIGHT mainstem bronchus intubation. RIGHT internal jugular central venous catheter projects at cavoatrial junction. Surgical clips RIGHT breast. Osseous structures are nonsuspicious. IMPRESSION: RIGHT mainstem bronchus intubation, recommend 2-3 cm retraction. RIGHT IJ central venous catheter distal tip at  cavoatrial junction. No pneumothorax. Mild cardiomegaly, mild chronic interstitial changes with bibasilar atelectasis/ scarring. Acute findings discussed with and reconfirmed by Dr.PEDRO CARDAMA on 11/23/2016 at 1:55 pm. Electronically Signed   By: Elon Alas M.D.   On: 11/23/2016 13:56   Dg Abd Portable 1v  Result Date: 11/23/2016 CLINICAL DATA:  OG tube placement at bedside. EXAM: PORTABLE ABDOMEN - 1 VIEW COMPARISON:  CT abdomen pelvis earlier today and 11/15/2016. FINDINGS: OG tube tip in the distal body of the stomach. Mild gaseous distention of the visualized transverse colon with moderate stool burden. No other distended bowel. IMPRESSION: 1. OG tube tip in the distal body of the stomach. 2. Probable mild colonic ileus. Electronically Signed   By: Evangeline Dakin M.D.   On: 11/23/2016 17:34    Assessment: 66 y.o. female with PMH of stage IA right breast cancer, on adjuvant anastrozole, who was found to have diffuse liver metastasis during her surgery of repair of hiatus hernia and Nissen wrap. She was admitted for GI bleeding yesterday   1. Upper GI bleeding from ulcer at the GE junction, s/p hemoclips placement  2. Acute respiratory failure due to hemorrhagic shock.  3.  Acute blood loss anemia 4. Newly diagnosed metastatic breast cancer to liver, ER+/PR-/HER2- 5. History of right breast cancer stage IA, on adjuvant letrozole  4. Transaminitis and hyperbilirubinemia, secondary to liver metastasis   Plan:  -I have reviewed her recent PET scan findings and lab results, especially abnormal liver functions with patient's son Ronalee Belts in details -We discussed the treatment options for her metastatic breast cancer. Due to her severe upper GI bleeding, hemorrhagic shock, we'll hold on any anti-cancer treatment for now.  -Continue blood transfusion as needed  -after she is more stabilized, I think we can consider starting her on antiestrogen therapy, such as fulvestrant, possible with  combination of CKD4 inhibitor such as Leslee Home -if she is able to recover very well, and her liver function remains to be moderately decompensated, chemotherapy could be considered given her diffuse liver mets and chemo may control her disease faster and better.  Ronalee Belts had many questions about her prognosis, we discussed the incurable nature of her metastatic breast cancer, and overall the palliative goal of her treatment. Certainly she  is  critically ill due to the GI  bleeding now, and I'm hopeful she will recover well and soon, and allow [place] to start her on anti-cancer treatment.  -Her primary oncologist Dr. Marin Olp will see her tomorrow.    Truitt Merle, MD 11/24/2016  6:21 PM

## 2016-11-24 NOTE — Care Management Note (Signed)
Case Management Note  Patient Details  Name: Alexandria [name] MRN: [id number] Date of Birth: April 04, 1951  Subjective/Objective:    Acute resp. Failure requiring intubation                Action/Plan:Date:  November 24, 2016 Chart reviewed for concurrent status and case management needs. Will continue to follow patient progress. Discharge Planning: following for needs Expected discharge date: [id number] Velva Harman, BSN, [place], [place]   Expected Discharge Date:   (unknown)               Expected Discharge Plan:  Home/Self Care  In-House Referral:     Discharge planning Services     Post Acute Care Choice:    Choice offered to:     DME Arranged:    DME Agency:     HH Arranged:    [place] Agency:     Status of Service:  In process, will continue to follow  If discussed at Long Length of Stay Meetings, dates discussed:    Additional Comments:  Leeroy Cha, RN 11/24/2016, 10:02 AM

## 2016-11-24 NOTE — Progress Notes (Signed)
[facility] Progress Note  Chief Complaint:   GI bleed  Subjective: intubated. She was agitated earlier this am so given more sedatives  Objective:  Vital signs in last 24 hours: Temp:  [97.6 F (36.4 C)-98.8 F (37.1 C)] 98.3 F (36.8 C) (03/09 1202) Pulse Rate:  [82-164] 113 (03/09 1202) Resp:  [16-22] 18 (03/09 1202) BP: (85-193)/(32-130) 103/51 (03/09 1100) SpO2:  [93 %-100 %] 95 % (03/09 1202) FiO2 (%):  [30 %-100 %] 30 % (03/09 1202) Weight:  [248 lb 3.8 oz (112.6 kg)] 248 lb 3.8 oz (112.6 kg) (03/08 1800)   General:   Arousable obese white female intubated on vent Heart:  Regular rate and rhythm Pulm: Normal respiratory effort, lungs CTA bilaterally without wheezes or crackles. Abdomen:  Soft, mildly distended with bowel sounds. .    Neurologic:  Sedated on vent. Psych:  Alert and cooperative. Normal mood and affect.   Intake/Output from previous day: 03/08 0701 - 03/09 0700 In: 5337.6 [I.V.:2375.8; Blood:1731.8; NG/GT:180; IV Piggyback:1050] Out: 865 [Urine:555; Emesis/NG output:310] Intake/Output this shift: Total I/O In: 213.3 [I.V.:183.3; NG/GT:30] Out: 490 [Urine:350; Emesis/NG output:140]  Lab Results:  Recent Labs  11/23/16 1905 11/24/16 0329 11/24/16 0759  WBC 20.5* 9.4  9.2 8.9  HGB 9.3* 8.2*  8.3* 7.9*  HCT 27.9* 25.3*  24.9* 23.4*  PLT 283 170  169 171   BMET  Recent Labs  11/23/16 0944 11/23/16 0953 11/24/16 0329  NA 139 141 144  K 4.6 4.6 4.6  CL 109 108 114*  CO2 23  --  23  GLUCOSE 127* 132* 91  BUN 18 18 21*  CREATININE 0.86 0.80 0.85  CALCIUM 8.2*  --  8.3*   LFT  Recent Labs  11/23/16 0944  PROT 5.9*  ALBUMIN 2.4*  AST 154*  ALT 60*  ALKPHOS 244*  BILITOT 3.3*   PT/INR  Recent Labs  11/23/16 0944  LABPROT 19.9*  INR 1.67   Hepatitis Panel No results for input(s): HEPBSAG, HCVAB, HEPAIGM, HEPBIGM in the last 72 hours.  Ct Abdomen Pelvis Wo Contrast  Result Date:  11/23/2016 CLINICAL DATA:  Abdominal pain. Breast cancer, metastatic to the liver EXAM: CT ABDOMEN AND PELVIS WITHOUT CONTRAST TECHNIQUE: Multidetector CT imaging of the abdomen and pelvis was performed following the standard protocol without IV contrast. COMPARISON:  PET CT 11/15/2016 FINDINGS: Lower chest: Small left pleural effusion, slightly increased in size since prior PET CT. Bibasilar opacities, likely atelectasis. Heart is normal size. Small hiatal hernia. Hepatobiliary: Changes of cirrhosis. Previously seen extensive metastatic disease throughout the liver is difficult to appreciate on this unenhanced study. Gallbladder unremarkable. Pancreas: No focal abnormality or ductal dilatation. Spleen: No focal abnormality.  Normal size. Adrenals/Urinary Tract: No adrenal abnormality. No focal renal abnormality. No stones or hydronephrosis. Urinary bladder is unremarkable. Stomach/Bowel: No evidence of aneurysm or adenopathy. Scattered aortic calcifications Vascular/Lymphatic: There is wall thickening within the cecum and ascending colon to the level of the proximal transverse colon. This is concerning for colitis. Stomach and small bowel decompressed. Reproductive: Prior hysterectomy.  No adnexal masses. Other: Small amount of free fluid adjacent to the liver. No free air. Musculoskeletal: No acute bony abnormality. IMPRESSION: Changes of cirrhosis. Previously demonstrated metastatic disease to the liver is difficult to visualize on this unenhanced study. Small amount of perihepatic ascites.  Small left pleural effusion. Wall thickening and surrounding inflammatory stranding involving the cecum, ascending colon and proximal transverse colon concerning for colitis. Small hiatal hernia. Bibasilar atelectasis.  Electronically Signed   By: Rolm Baptise M.D.   On: 11/23/2016 10:21   Dg Chest Port 1 View  Result Date: 11/24/2016 CLINICAL DATA:  Acute respiratory failure EXAM: PORTABLE CHEST 1 VIEW COMPARISON:   11/23/2016 FINDINGS: The endotracheal tube has been repositioned and is 1 cm above the carina. The nasogastric tube extends into the stomach. The right jugular central line terminates in the low SVC. Left base consolidation persists without significant interval change. No pneumothorax. IMPRESSION: 1.  Support equipment appears satisfactorily positioned. 2. Unchanged left base consolidation. Electronically Signed   By: Andreas Newport M.D.   On: 11/24/2016 05:24   Dg Chest Portable 1 View  Result Date: 11/23/2016 CLINICAL DATA:  Post intubation. History of metastatic breast cancer, cirrhosis. EXAM: PORTABLE CHEST 1 VIEW COMPARISON:  Chest radiograph November 07, 2016 FINDINGS: Cardiac silhouette is mildly enlarged, improved from prior radiograph. Tortuous, mildly calcified aorta. Persistently elevated RIGHT hemidiaphragm with low lung volumes. Chronic interstitial changes with strandy densities in lung bases. No pleural effusion. No pneumothorax. RIGHT mainstem bronchus intubation. RIGHT internal jugular central venous catheter projects at cavoatrial junction. Surgical clips RIGHT breast. Osseous structures are nonsuspicious. IMPRESSION: RIGHT mainstem bronchus intubation, recommend 2-3 cm retraction. RIGHT IJ central venous catheter distal tip at cavoatrial junction. No pneumothorax. Mild cardiomegaly, mild chronic interstitial changes with bibasilar atelectasis/ scarring. Acute findings discussed with and reconfirmed by Dr.PEDRO CARDAMA on 11/23/2016 at 1:55 pm. Electronically Signed   By: Elon Alas M.D.   On: 11/23/2016 13:56   Dg Abd Portable 1v  Result Date: 11/23/2016 CLINICAL DATA:  OG tube placement at bedside. EXAM: PORTABLE ABDOMEN - 1 VIEW COMPARISON:  CT abdomen pelvis earlier today and 11/15/2016. FINDINGS: OG tube tip in the distal body of the stomach. Mild gaseous distention of the visualized transverse colon with moderate stool burden. No other distended bowel. IMPRESSION: 1. OG tube tip  in the distal body of the stomach. 2. Probable mild colonic ileus. Electronically Signed   By: Evangeline Dakin M.D.   On: 11/23/2016 17:34    Assessment / Plan:  1 66 yo female with major upper GI bleed yesterday. Intubated for airway protection. EGD done but visualization poor because of massive bleeding. There was a huge actively bleeding GEJ ulcer.  Area injected with epinephrine and two of three hemoclips were successfully placed. Unfortunately bleeding persisted. Surgery was on standby and spoke to husband and son who decided  against surgical measures. They opted for comfort care.  Patient did amazingly well overnight. She is off pressors, no evidence for active bleeding now. WBC normal. She got a unit of blood last night, hgb 7.9 today.  -We don't want to repeat EGD too soon as this could disrupt hemostasis / clips. Will likely repeat in 2-3 days at which time lesion biopsy could be done  2. Breast cancer, recently found to have liver mets on biopsy taken during time Nissen fundoplication last month.  3. Cirrhosis by CTscan. Radiographic finding may represent the liver mets and not cirrhosis.      Active Problems:   Acute upper GI bleed   GIB (gastrointestinal bleeding)   Leukocytosis    LOS: 1 day   Tye Savoy NP 11/24/2016, 12:38 PM  Pager number [phone]

## 2016-11-24 NOTE — Progress Notes (Signed)
eLink Physician-Brief Progress Note Patient Name: Alexandria [name] DOB: 05-11-1951 MRN: [id number]   Date of Service  11/24/2016  HPI/Events of Note  hypomag  eICU Interventions  Mag replaced     Intervention Category Intermediate Interventions: Electrolyte abnormality - evaluation and management  DETERDING,ELIZABETH 11/24/2016, 5:01 AM

## 2016-11-24 NOTE — Progress Notes (Signed)
Made NP aware of 7.5 hbg. No new orders. Will continue to monitor pt closely.

## 2016-11-24 NOTE — Progress Notes (Signed)
   11/24/16 1400  Clinical Encounter Type  Visited With Family  Visit Type Initial;Psychological support;Spiritual support;Critical Care  Referral From Nurse  Consult/Referral To Chaplain  Spiritual Encounters  Spiritual Needs Emotional;Other (Comment) (Pastoral Conversation/Support)  Stress Factors  Patient Stress Factors Not reviewed  Family Stress Factors Exhausted;Health changes   I visited with the patient's spouse after being notified that he was having a difficult time. The ICU Nursing Director was in the room and introduced me to the patient's spouse who was verbally upset and exhausted.  I introduced Spiritual Care services, and he stated that he and the patient would appreciate follow up.  Please, contact Spiritual Care for further assistance.   [place]anks  M.Div.

## 2016-11-24 NOTE — Progress Notes (Signed)
Initial Nutrition Assessment  DOCUMENTATION CODES:   Morbid obesity  INTERVENTION:  - If pt unable to be extubated in the next 24 hours and if GIB improved, recommend initiation of trickle feeds of Vital High Protein @ 20 mL/hr with 30 mL Prostat BID which will provide 680 kcal, 72 grams of protein, and 401 mL free water.  - RD will continue to monitor for needs.  NUTRITION DIAGNOSIS:   Inadequate oral intake related to inability to eat as evidenced by NPO status.  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  The patient reported the bleeding began on 3/7, was bright red and was associated with abdominal pain.  She was recently admitted from 2/16-2/24 for Nissen fundoplication in setting of hiatal hernia.  During the surgery, her liver was noted to have an abnormal appearance prompting biopsy which was positive for metastatic breast cancer. The patient was scheduled to have a PET scan 2/28 which showed cirrhotic / hypermetabolic liver consistent with metastatic disease.  She was unable to make her ONC follow up.  Pt seen for new vent. BMI indicates morbid obesity. Spoke with pt's son, who is at bedside. He reports that PTA pt was not eating well since Nissen surgery, mainly consuming chocolate milk and ice cream. Son states that weight has been stable but that pt also has been retaining fluid and on Lasix; he is unable to specify time frame for this. Son is visibly anxious about his mom's current condition. Unable to obtain much nutrition-related information during this visit and mainly provided active listening during visit. Ensured him that an RD will continue to follow pt throughout hospitalization and assess for needs here and after d/c.   Physical  assessment shows no muscle or fat wasting, mild edema throughout. Per chart review, pt has gained 10 lbs over the past 1 month which is likely fluid-related given pt's report of pt eating very poorly.   Patient is currently intubated on ventilator support. OGT in place to suction with blood in tubing and scant (<25cc) in canister at this time. MV: 9 L/min Temp (24hrs), Avg:98.1 F (36.7 C), Min:97.6 F (36.4 C), Max:98.8 F (37.1 C) Propofol: none BP: 111/53 and MAP: 67  Medications reviewed; 2 g IV Mg sulfate x1 dose today, 40 mg IV Protonix BID.  Labs reviewed; Cl: 114 mmol/L, Ca: 8.3 mg/dL, LFTs elevated.  IVF: NS @ 125 mL/hr.  Drip: Fentanyl @ 75 mcg/hr.   Diet Order:  Diet NPO time specified  Skin:  Reviewed, no issues  Last BM:  PTA/unknown  Height:   Ht Readings from Last 1 Encounters:  11/23/16 5\' 6"  (1.676 m)    Weight:   Wt Readings from Last 1 Encounters:  11/23/16 248 lb 3.8 oz (112.6 kg)    Ideal Body Weight:  59.09 kg  BMI:  Body mass index is 40.07 kg/m.  Estimated Nutritional Needs:   Kcal:  2353-6144 (22-25 kcal/kg IBW)  Protein:  118 grams (2 grams/kg IBW)  Fluid:  >/= 1.2 L/day  EDUCATION NEEDS:   No education needs identified at this time    Jarome Matin, MS, RD, LDN, CNSC Inpatient Clinical Dietitian Pager # [phone] After hours/weekend pager # [phone]

## 2016-11-24 NOTE — Progress Notes (Signed)
PULMONARY / CRITICAL CARE MEDICINE   Name: Alexandria [name] MRN: [id number] DOB: November 17, 1950    ADMISSION DATE:  11/23/2016 CONSULTATION DATE:  11/23/16  REFERRING MD:  Dr. Leonette Monarch / EDP   CHIEF COMPLAINT:  GIB, AMS  SUMMARY:  66 y/o F with new diagnosis of metastatic breast cancer to the liver admitted 3/8 with acute upper GIB in the setting of a large ulceration at the GEJ with active bleeding.    SUBJECTIVE: Electrolyte disturbances overnight, replaced by [facility]. Afebrile.  RN reports ongoing tachycardia (110's).  Following commands.  ~ 150 ml bloody output overnight (including saline flush).  Off pressors, received 1 unit blood overnight.   VITAL SIGNS: BP 129/68   Pulse (!) 114   Temp 97.8 F (36.6 C) (Oral)   Resp 20   Ht 5\' 6"  (1.676 m)   Wt 248 lb 3.8 oz (112.6 kg)   LMP 01/30/2005   SpO2 100%   BMI 40.07 kg/m   HEMODYNAMICS:    VENTILATOR SETTINGS: Vent Mode: PRVC FiO2 (%):  [40 %-100 %] 40 % Set Rate:  [18 bmp] 18 bmp Vt Set:  [500 mL] 500 mL PEEP:  [5 cmH20] 5 cmH20 Plateau Pressure:  [14 cmH20-18 cmH20] 17 cmH20  INTAKE / OUTPUT: I/O last 3 completed shifts: In: 5337.6 [I.V.:2375.8; Blood:1731.8; NG/GT:180; IV Piggyback:1050] Out: 865 [Urine:555; Emesis/NG output:310]  PHYSICAL EXAMINATION: General: morbidly obese female in NAD lying in bed HEENT: MM pink/moist, ETT Neuro: sedate, follows commands, MAE CV: s1s2 rrr, no m/r/g PULM: even/non-labored, lungs bilaterally clear QM:GQQP, non-tender, bsx4 active  Extremities: warm/dry, trace LE edema  Skin: no rashes or lesions   LABS:  BMET  Recent Labs Lab 11/23/16 0944 11/23/16 0953 11/24/16 0329  NA 139 141 144  K 4.6 4.6 4.6  CL 109 108 114*  CO2 23  --  23  BUN 18 18 21*  CREATININE 0.86 0.80 0.85  GLUCOSE 127* 132* 91    Electrolytes  Recent Labs Lab 11/23/16 0944 11/24/16 0329  CALCIUM 8.2* 8.3*  MG  --  1.8  PHOS  --  2.8    CBC  Recent Labs Lab 11/23/16 1638 11/23/16 1905  11/24/16 0329  WBC 16.2* 20.5* 9.4  9.2  HGB 9.7* 9.3* 8.2*  8.3*  HCT 30.2* 27.9* 25.3*  24.9*  PLT 339 283 170  169    Coag's  Recent Labs Lab 11/23/16 0944  INR 1.67    Sepsis Markers  Recent Labs Lab 11/23/16 0953  LATICACIDVEN 2.73*    ABG  Recent Labs Lab 11/23/16 1515 11/24/16 0428  PHART 7.233* 7.394  PCO2ART 44.7 39.9  PO2ART 277* 80.9*    Liver Enzymes  Recent Labs Lab 11/23/16 0944  AST 154*  ALT 60*  ALKPHOS 244*  BILITOT 3.3*  ALBUMIN 2.4*    Cardiac Enzymes No results for input(s): TROPONINI, PROBNP in the last 168 hours.  Glucose No results for input(s): GLUCAP in the last 168 hours.  Imaging Ct Abdomen Pelvis Wo Contrast  Result Date: 11/23/2016 CLINICAL DATA:  Abdominal pain. Breast cancer, metastatic to the liver EXAM: CT ABDOMEN AND PELVIS WITHOUT CONTRAST TECHNIQUE: Multidetector CT imaging of the abdomen and pelvis was performed following the standard protocol without IV contrast. COMPARISON:  PET CT 11/15/2016 FINDINGS: Lower chest: Small left pleural effusion, slightly increased in size since prior PET CT. Bibasilar opacities, likely atelectasis. Heart is normal size. Small hiatal hernia. Hepatobiliary: Changes of cirrhosis. Previously seen extensive metastatic disease throughout the liver is  difficult to appreciate on this unenhanced study. Gallbladder unremarkable. Pancreas: No focal abnormality or ductal dilatation. Spleen: No focal abnormality.  Normal size. Adrenals/Urinary Tract: No adrenal abnormality. No focal renal abnormality. No stones or hydronephrosis. Urinary bladder is unremarkable. Stomach/Bowel: No evidence of aneurysm or adenopathy. Scattered aortic calcifications Vascular/Lymphatic: There is wall thickening within the cecum and ascending colon to the level of the proximal transverse colon. This is concerning for colitis. Stomach and small bowel decompressed. Reproductive: Prior hysterectomy.  No adnexal masses.  Other: Small amount of free fluid adjacent to the liver. No free air. Musculoskeletal: No acute bony abnormality. IMPRESSION: Changes of cirrhosis. Previously demonstrated metastatic disease to the liver is difficult to visualize on this unenhanced study. Small amount of perihepatic ascites.  Small left pleural effusion. Wall thickening and surrounding inflammatory stranding involving the cecum, ascending colon and proximal transverse colon concerning for colitis. Small hiatal hernia. Bibasilar atelectasis. Electronically Signed   By: Rolm Baptise M.D.   On: 11/23/2016 10:21   Dg Chest Port 1 View  Result Date: 11/24/2016 CLINICAL DATA:  Acute respiratory failure EXAM: PORTABLE CHEST 1 VIEW COMPARISON:  11/23/2016 FINDINGS: The endotracheal tube has been repositioned and is 1 cm above the carina. The nasogastric tube extends into the stomach. The right jugular central line terminates in the low SVC. Left base consolidation persists without significant interval change. No pneumothorax. IMPRESSION: 1.  Support equipment appears satisfactorily positioned. 2. Unchanged left base consolidation. Electronically Signed   By: Andreas Newport M.D.   On: 11/24/2016 05:24   Dg Chest Portable 1 View  Result Date: 11/23/2016 CLINICAL DATA:  Post intubation. History of metastatic breast cancer, cirrhosis. EXAM: PORTABLE CHEST 1 VIEW COMPARISON:  Chest radiograph November 07, 2016 FINDINGS: Cardiac silhouette is mildly enlarged, improved from prior radiograph. Tortuous, mildly calcified aorta. Persistently elevated RIGHT hemidiaphragm with low lung volumes. Chronic interstitial changes with strandy densities in lung bases. No pleural effusion. No pneumothorax. RIGHT mainstem bronchus intubation. RIGHT internal jugular central venous catheter projects at cavoatrial junction. Surgical clips RIGHT breast. Osseous structures are nonsuspicious. IMPRESSION: RIGHT mainstem bronchus intubation, recommend 2-3 cm retraction. RIGHT IJ  central venous catheter distal tip at cavoatrial junction. No pneumothorax. Mild cardiomegaly, mild chronic interstitial changes with bibasilar atelectasis/ scarring. Acute findings discussed with and reconfirmed by Dr.PEDRO CARDAMA on 11/23/2016 at 1:55 pm. Electronically Signed   By: Elon Alas M.D.   On: 11/23/2016 13:56   Dg Abd Portable 1v  Result Date: 11/23/2016 CLINICAL DATA:  OG tube placement at bedside. EXAM: PORTABLE ABDOMEN - 1 VIEW COMPARISON:  CT abdomen pelvis earlier today and 11/15/2016. FINDINGS: OG tube tip in the distal body of the stomach. Mild gaseous distention of the visualized transverse colon with moderate stool burden. No other distended bowel. IMPRESSION: 1. OG tube tip in the distal body of the stomach. 2. Probable mild colonic ileus. Electronically Signed   By: Evangeline Dakin M.D.   On: 11/23/2016 17:34     STUDIES:  EGD 3/8 >> large ulceration at GEJ with active bleeding  CULTURES:   ANTIBIOTICS: Ceftriaxone 3/9 >>   SIGNIFICANT EVENTS: 3/08  Admit with GIB, hypotension/shock  LINES/TUBES: ETT 3/8 >>  R IJ TLC 3/8 >>   DISCUSSION: 66 y/o F with recent Nissen Fundoplication and incidental finding of metastatic breast cancer admitted with acute GIB, hemorrhagic shock s/p EGD with clipping + epi.    ASSESSMENT / PLAN:  PULMONARY A: Acute Respiratory Failure in setting of Hemorrhagic Shock  L  Basilar Consolidation P:   PRVC 8cc/kg Wean PEEP / FiO2 for sats >92% Intermittent CXR ABG PRN PRN albuterol Monitor left base, suspect aspiration of blood  CARDIOVASCULAR A:  Hemorrhagic Shock in setting of GIB - resolved.  Hx HTN, HLD, dCHF AF  P:   Monitor in ICU Pressors if needed for MAP > 65 NS @75  ml/hr DNR in event of arrest  RENAL A:   At Risk AKI - in setting of volume depletion / hypotension  Hypomagnesemia  P:   Trend BMP / UOP Replace electrolyes as indicated  GASTROINTESTINAL A:   Acute GIB in setting of Gastric Ulcer  with Perforation  Metastatic Breast Cancer to Liver  Hx Hiatal Hernia s/p Nissen Fundoplication  P:   NPO Appreciate GI assistance Continue protonix gtt Monitor for further bleeding ? If she will need a second look EGD If rebleeds, may need IR embolization vs surgery.  Would need to call [facility] back if needed.   HEMATOLOGIC / ONC  A:   Acute Blood Loss Anemia  Breast Cancer with Metastasis to Liver  P:  Trend CBC  Transfuse per ICU guidelines, patient has multiple antibodies  Blood bank notified to remain 2 units ahead SCD's for DVT prophylaxis   INFECTIOUS A:   LLL Consolidation - ? Aspiration  P:   Monitor fever curve / WBC Empiric rocephin added 3/9 for pulmonary / SBP coverage  ENDOCRINE A:   At Risk Hypo/Hyperglycemia    P:   Trend CBG  NEUROLOGIC A:   Acute Encephalopathy in setting of GIB  P:   RASS goal: 0 to -1  Fentanyl gtt for pain  PRN versed for sedation    FAMILY  - Updates:  Son updated at bedside am 3/9.  DNR in the event of arrest.  Continue supportive care for now.  Primary goal is comfort but family also hopeful to get her through this episode.    - Inter-disciplinary family meet or Palliative Care meeting due by: Completed on admit.     CC Time:  30 minutes  Noe Gens, NP-[facility] Pgr: [phone] or if no answer [phone] 11/24/2016, 7:38 AM

## 2016-11-25 ENCOUNTER — Encounter (HOSPITAL_COMMUNITY)
Admission: EM | Disposition: A | Discharge status: Skilled Nursing Facility | Payer: Self-pay | Source: Home / Self Care | Attending: Pulmonary Disease

## 2016-11-25 ENCOUNTER — Encounter (HOSPITAL_COMMUNITY): Payer: Self-pay

## 2016-11-25 ENCOUNTER — Inpatient Hospital Stay (HOSPITAL_COMMUNITY): Payer: Medicare Other

## 2016-11-25 DIAGNOSIS — Z170 Estrogen receptor positive status [ER+]: Secondary | ICD-10-CM

## 2016-11-25 DIAGNOSIS — Z01818 Encounter for other preprocedural examination: Secondary | ICD-10-CM

## 2016-11-25 DIAGNOSIS — K228 Other specified diseases of esophagus: Secondary | ICD-10-CM

## 2016-11-25 DIAGNOSIS — K259 Gastric ulcer, unspecified as acute or chronic, without hemorrhage or perforation: Secondary | ICD-10-CM

## 2016-11-25 DIAGNOSIS — D72829 Elevated white blood cell count, unspecified: Secondary | ICD-10-CM

## 2016-11-25 HISTORY — PX: ESOPHAGOGASTRODUODENOSCOPY: SHX5428

## 2016-11-25 LAB — CBC
HCT: 21.5 % — ABNORMAL LOW (ref 36.0–46.0)
HCT: 22.3 % — ABNORMAL LOW (ref 36.0–46.0)
HCT: 25.1 % — ABNORMAL LOW (ref 36.0–46.0)
HEMATOCRIT: 22.7 % — AB (ref 36.0–46.0)
HEMOGLOBIN: 7.7 g/dL — AB (ref 12.0–15.0)
Hemoglobin: 7.1 g/dL — ABNORMAL LOW (ref 12.0–15.0)
Hemoglobin: 7.2 g/dL — ABNORMAL LOW (ref 12.0–15.0)
Hemoglobin: 8.5 g/dL — ABNORMAL LOW (ref 12.0–15.0)
MCH: 27.1 pg (ref 26.0–34.0)
MCH: 26.5 pg (ref 26.0–34.0)
MCH: 27.9 pg (ref 26.0–34.0)
MCH: 28 pg (ref 26.0–34.0)
MCHC: 33 g/dL (ref 30.0–36.0)
MCHC: 32.3 g/dL (ref 30.0–36.0)
MCHC: 33.9 g/dL (ref 30.0–36.0)
MCHC: 33.9 g/dL (ref 30.0–36.0)
MCV: 82.1 fL (ref 78.0–100.0)
MCV: 82 fL (ref 78.0–100.0)
MCV: 82.3 fL (ref 78.0–100.0)
MCV: 82.5 fL (ref 78.0–100.0)
PLATELETS: 144 10*3/uL — AB (ref 150–400)
PLATELETS: 137 10*3/uL — AB (ref 150–400)
PLATELETS: 136 10*3/uL — AB (ref 150–400)
Platelets: 122 10*3/uL — ABNORMAL LOW (ref 150–400)
RBC: 2.62 MIL/uL — AB (ref 3.87–5.11)
RBC: 2.72 MIL/uL — ABNORMAL LOW (ref 3.87–5.11)
RBC: 3.05 MIL/uL — AB (ref 3.87–5.11)
RBC: 2.75 MIL/uL — AB (ref 3.87–5.11)
RDW: 17.4 % — ABNORMAL HIGH (ref 11.5–15.5)
RDW: 17.5 % — AB (ref 11.5–15.5)
RDW: 17 % — ABNORMAL HIGH (ref 11.5–15.5)
RDW: 17.2 % — ABNORMAL HIGH (ref 11.5–15.5)
WBC: 4.7 10*3/uL (ref 4.0–10.5)
WBC: 4.4 10*3/uL (ref 4.0–10.5)
WBC: 4.7 10*3/uL (ref 4.0–10.5)
WBC: 4.2 10*3/uL (ref 4.0–10.5)

## 2016-11-25 LAB — BASIC METABOLIC PANEL
Anion gap: 4 — ABNORMAL LOW (ref 5–15)
BUN: 18 mg/dL (ref 6–20)
CALCIUM: 7.9 mg/dL — AB (ref 8.9–10.3)
CHLORIDE: 114 mmol/L — AB (ref 101–111)
CO2: 24 mmol/L (ref 22–32)
CREATININE: 0.73 mg/dL (ref 0.44–1.00)
GFR calc non Af Amer: >60 mL/min (ref >60)
Glucose, Bld: 97 mg/dL (ref 65–99)
Potassium: 3.3 mmol/L — ABNORMAL LOW (ref 3.5–5.1)
Sodium: 142 mmol/L (ref 135–145)

## 2016-11-25 LAB — APTT: aPTT: 51 seconds — ABNORMAL HIGH (ref 24–36)

## 2016-11-25 LAB — PROTIME-INR
INR: 2.1
PROTHROMBIN TIME: 23.9 s — AB (ref 11.4–15.2)

## 2016-11-25 LAB — GLUCOSE, CAPILLARY: Glucose-Capillary: 112 mg/dL — ABNORMAL HIGH (ref 65–99)

## 2016-11-25 SURGERY — EGD (ESOPHAGOGASTRODUODENOSCOPY)
Anesthesia: Moderate Sedation

## 2016-11-25 MED ORDER — SODIUM CHLORIDE 0.9 % IV SOLN: 30.0000 meq | Freq: Once | INTRAVENOUS | Status: DC

## 2016-11-25 MED ORDER — EPINEPHRINE PF 1 MG/10ML IJ SOSY
PREFILLED_SYRINGE | INTRAMUSCULAR | Status: AC
  Filled 2016-11-25: qty 10

## 2016-11-25 MED ORDER — MIDAZOLAM HCL 5 MG/ML IJ SOLN
INTRAMUSCULAR | Status: AC
Start: 2016-11-25 — End: 2016-11-25
  Filled 2016-11-25: qty 2

## 2016-11-25 MED ORDER — POTASSIUM CHLORIDE 10 MEQ/100ML IV SOLN
10.0000 meq | INTRAVENOUS | Status: AC
  Administered 2016-11-25 (×6): 10 meq via INTRAVENOUS
  Filled 2016-11-25 (×6): qty 100

## 2016-11-25 MED ORDER — FENTANYL CITRATE (PF) 100 MCG/2ML IJ SOLN
INTRAMUSCULAR | Status: AC
  Filled 2016-11-25: qty 4

## 2016-11-25 MED ORDER — SODIUM CHLORIDE 0.9 % IV SOLN
30.0000 meq | Freq: Once | INTRAVENOUS | Status: DC
Start: 2016-11-25 — End: 2016-11-25

## 2016-11-25 MED ORDER — LABETALOL HCL 5 MG/ML IV SOLN
10.0000 mg | INTRAVENOUS | Status: DC | PRN
  Administered 2016-11-25 – 2016-12-02 (×8): 10 mg via INTRAVENOUS
  Filled 2016-11-25 (×10): qty 4

## 2016-11-25 MED ORDER — SODIUM CHLORIDE 0.9 % IV SOLN
Freq: Once | INTRAVENOUS | Status: DC
  Filled 2016-11-25: qty 1000

## 2016-11-25 MED ORDER — MIDAZOLAM HCL 10 MG/2ML IJ SOLN
INTRAMUSCULAR | Status: DC | PRN
  Administered 2016-11-25: 1 mg via INTRAVENOUS
  Administered 2016-11-25 (×2): 2 mg via INTRAVENOUS

## 2016-11-25 MED ORDER — SODIUM CHLORIDE 0.9 % IV SOLN
Freq: Once | INTRAVENOUS | Status: AC
  Administered 2016-11-25: 11:00:00 via INTRAVENOUS

## 2016-11-25 NOTE — Progress Notes (Signed)
[facility] Progress Note Patient Name: Alexandria [name] DOB: 11-18-1950 MRN: [id number]   Date of Service  11/25/2016  HPI/Events of Note  Low K   eICU Interventions  replaced     Intervention Category Minor Interventions: Electrolytes abnormality - evaluation and management  Mauri Brooklyn, P 11/25/2016, 6:33 AM

## 2016-11-25 NOTE — Consult Note (Signed)
Referral MD  Reason for Referral: Metastatic breast cancer-liver confined  Chief Complaint  Patient presents with  . Hematemesis  : Patient is intubated so she cannot give any history.  HPI: Ms. Nayak is a very nice 66 year old [employer] white female. I actually saw her for the first time in the office back in November. That time, she has seen me for follow-up for stage I rest cancer. This was up in [place]. She had a lumpectomy. She had a high Oncotype score. She had chemotherapy with Taxotere/Cytoxan. She had a lumpectomy. She had radiation therapy. This was back in July 2014. She was placed on Arimidex. She had surgery back in February. She had repair of a hernia. At the time, she had liver biopsies which showed that she had metastatic breast cancer.  With her staging studies, her CA-27-29 was elevated at 7:30.  Her PET scan only showed that there was disease in her liver.  We have not had a chance to see her in the office. She was supposed to see [place] in the office yesterday but was admitted because of upper GI bleeding. She had an endoscopy which showed that she had an ulcer.  She is still intubated. She has compromised lung function.  Overall, she has been in pretty good shape.   Past Medical History:  Diagnosis Date  . Anxiety   . Breast cancer ([place]) 10/02/12   Invasive Ductal Carcinoma of the Right Upper Outer Quadrant - ER (>90%), PR - Neg., Her2 Neu Negative, Ki-67 Unknown  . Chronic back pain    "all the way up and down"  . Chronic bronchitis ([place])    "get it q yr" (09/07/2014)  . Complication of anesthesia    "I come out really anxious; need Ativan to ease me out" (09/07/2014)  . Daily headache    "recently" (09/07/2014)  . DDD (degenerative disc disease), cervical   . DDD (degenerative disc disease), lumbosacral   . DDD (degenerative disc disease), thoracolumbar   . Diastolic dysfunction   . Diastolic heart failure ([place])   . Fibromyalgia   . GAD (generalized  anxiety disorder)   . GERD (gastroesophageal reflux disease)   . H/O hiatal hernia   . Heart murmur   . History of blood transfusion "3-4"   "never can tell why; don't know where the blood was coming from; doesn't show up in stool or urine"  . Hx of pulmonary embolus    During c-Section  . Hx of radiation therapy 03/04/13- 04/17/13   right breast 50 Gy 25 fractions, right breast boost 10 Gy 5 fractions  . Hyperlipidemia   . Hypertension   . Iron deficiency anemia   . Lymphedema    bilateral lower extremity  . Obesity    Class 2  . Peripheral neuropathic pain   . Pneumonia    "at least twice/yr" (09/07/2014)  . PONV (postoperative nausea and vomiting)   . Recurrent major depression ([place]) 06/2014   Seen by Dr. Louretta Shorten  . S/P radiation therapy  03/04/2013-04/17/2013   1) Right breast / 50 Gy in 25 fractions/ 2) Right breast boost / 10 Gy in 5 fractions  . Status post chemotherapy    4 cycles of Taxotere and cytoxan  :  Past Surgical History:  Procedure Laterality Date  . ABDOMINAL HYSTERECTOMY    . APPENDECTOMY    . BREAST BIOPSY Right   . BREAST LUMPECTOMY Right   . CESAREAN SECTION     [phone]  .  DILATION AND CURETTAGE OF UTERUS  "numerous"  . FOOT SURGERY Right ~ 2013 X 3   "put pins in but pins kept breaking"  . HIATAL HERNIA REPAIR N/A 11/03/2016   Procedure: LAPAROSCOPIC NISSEN AND  REPAIR OF HIATAL HERNIA,;  Surgeon: Johnathan Hausen, MD;  Location: [facility];  Service: General;  Laterality: N/A;  . INGUINAL HERNIA REPAIR Bilateral    "cancer"  . PARTIAL COLECTOMY N/A 07/04/2014   Procedure: REPAIR OF INCARCERATED INCISIONAL HERNIA WITH MESH;  Surgeon: Excell Seltzer, MD;  Location: [facility];  Service: General;  Laterality: N/A;  . TONSILLECTOMY    . UMBILICAL HERNIA REPAIR  X 2  :   Current Facility-Administered Medications:  .  [COMPLETED] sodium chloride 0.9 % bolus 1,000 mL, 1,000 mL, Intravenous, Once, Stopped at 11/23/16 1121 **AND** 0.9 %  sodium  chloride infusion, , Intravenous, Continuous, Raylene Miyamoto, MD, Last Rate: 10 mL/hr at 11/25/16 0809 .  0.9 %  sodium chloride infusion, 250 mL, Intravenous, PRN, Donita Brooks, NP .  0.9 %  sodium chloride infusion, , Intravenous, Once, Raylene Miyamoto, MD .  cefTRIAXone (ROCEPHIN) 2 g in dextrose 5 % 50 mL IVPB, 2 g, Intravenous, Q24H, Donita Brooks, NP, 2 g at 11/25/16 0831 .  chlorhexidine gluconate (MEDLINE KIT) (PERIDEX) 0.12 % solution 15 mL, 15 mL, Mouth Rinse, BID, Juanito Doom, MD, 15 mL at 11/24/16 2043 .  fentaNYL (SUBLIMAZE) 2,500 mcg in sodium chloride 0.9 % 250 mL (10 mcg/mL) infusion, 25-400 mcg/hr, Intravenous, Continuous, Donita Brooks, NP, Last Rate: 12.5 mL/hr at 11/25/16 0817, 125 mcg/hr at 11/25/16 0817 .  fentaNYL (SUBLIMAZE) bolus via infusion 50 mcg, 50 mcg, Intravenous, Q1H PRN, Donita Brooks, NP, 50 mcg at 11/25/16 0630 .  fentaNYL (SUBLIMAZE) injection 50 mcg, 50 mcg, Intravenous, Once, Donita Brooks, NP .  labetalol (NORMODYNE,TRANDATE) injection 10 mg, 10 mg, Intravenous, Q4H PRN, Raylene Miyamoto, MD, 10 mg at 11/25/16 0829 .  MEDLINE mouth rinse, 15 mL, Mouth Rinse, QID, Juanito Doom, MD, 15 mL at 11/25/16 0337 .  ondansetron (ZOFRAN) injection 4 mg, 4 mg, Intravenous, Q6H PRN, Fatima Blank, MD, 4 mg at 11/23/16 1043 .  ondansetron (ZOFRAN) injection 4 mg, 4 mg, Intravenous, Q6H PRN, Donita Brooks, NP .  pantoprazole (PROTONIX) 80 mg in sodium chloride 0.9 % 250 mL (0.32 mg/mL) infusion, 8 mg/hr, Intravenous, Continuous, Fatima Blank, MD, Last Rate: 25 mL/hr at 11/25/16 0600, 8 mg/hr at 11/25/16 0600 .  [START ON 11/27/2016] pantoprazole (PROTONIX) injection 40 mg, 40 mg, Intravenous, Q12H, Fatima Blank, MD .  potassium chloride 10 mEq in 100 mL IVPB, 10 mEq, Intravenous, Q1 Hr x 6, Juanito Doom, MD:  . sodium chloride   Intravenous Once  . cefTRIAXone (ROCEPHIN)  IV  2 g Intravenous Q24H  . chlorhexidine  gluconate (MEDLINE KIT)  15 mL Mouth Rinse BID  . fentaNYL (SUBLIMAZE) injection  50 mcg Intravenous Once  . mouth rinse  15 mL Mouth Rinse QID  . [START ON 11/27/2016] pantoprazole  40 mg Intravenous Q12H  . potassium chloride  10 mEq Intravenous Q1 Hr x 6  :  Allergies  Allergen Reactions  . Contrast Media [Iodinated Diagnostic Agents] Anaphylaxis  . Solu-Medrol [Methylprednisolone Acetate] Other (See Comments)    Pt. States she is not allergic, just can't tolerate medication well.   . Albuterol Other (See Comments)    Anxious   . Prednisone Other (See Comments)  Pt gets very agitated when she takes high doses of steroids  :  Family History  Problem Relation Age of Onset  . Parkinson's disease Mother   . Emphysema Maternal Grandfather   . COPD Maternal Grandfather   . COPD Sister   . Heart attack Sister   . COPD Maternal Grandmother   . CAD Neg Hx   :  Social History   Social History  . Marital status: Married    Spouse name: N/A  . Number of children: N/A  . Years of education: N/A   Occupational History  . retired Therapist, sports    Social History Main Topics  . Smoking status: Never Smoker  . Smokeless tobacco: Never Used     Comment: Through father  . Alcohol use No  . Drug use: No  . Sexual activity: Not Currently   Other Topics Concern  . Not on file   Social History Narrative   Originally from [place], [place]. Previously lived in [place] [facility]. Previously worked as an Warden/ranger. No known TB exposure. No pets currently. No bird exposure. Minimal mold exposure.   :  Pertinent items are noted in HPI.  Exam: Patient Vitals for the past 24 hrs:  BP Temp Temp src Pulse Resp SpO2 Weight  11/25/16 0600 (!) 170/85 - - (!) 124 (!) 23 98 % -  11/25/16 0500 (!) 153/76 - - (!) 116 (!) 22 98 % 253 lb 8.5 oz (115 kg)  11/25/16 0400 (!) 149/80 98.6 F (37 C) Axillary (!) 119 20 95 % -  11/25/16 0325 (!) 141/74 - - 98 18 95 % -  11/25/16 0300 (!) 141/74 - - (!)  114 18 93 % -  11/25/16 0200 (!) 162/74 - - 99 (!) 23 91 % -  11/25/16 0100 (!) 146/71 - - 89 18 94 % -  11/25/16 0000 (!) 160/74 - - 92 18 93 % -  11/24/16 2356 (!) 160/74 98.3 F (36.8 C) Axillary 92 18 92 % -  11/24/16 2300 (!) 164/72 - - 100 17 95 % -  11/24/16 2259 - 99.4 F (37.4 C) Axillary - - - -  11/24/16 2242 (!) 159/75 98.6 F (37 C) Axillary 91 (!) 21 95 % -  11/24/16 2208 (!) 170/71 98.9 F (37.2 C) Axillary 98 19 96 % -  11/24/16 2200 (!) 165/67 - - 98 18 95 % -  11/24/16 2142 (!) 159/75 98.6 F (37 C) Axillary - - - -  11/24/16 2100 (!) 178/68 - - 91 18 93 % -  11/24/16 2036 (!) 168/64 98.6 F (37 C) Axillary 93 18 94 % -  11/24/16 2021 (!) 169/73 - - 92 18 94 % -  11/24/16 2006 (!) 163/78 98.6 F (37 C) Axillary 98 18 91 % -  11/24/16 2000 (!) 163/78 - - 97 (!) 22 94 % -  11/24/16 1957 - - - - - - 253 lb 8.5 oz (115 kg)  11/24/16 1946 - 98.4 F (36.9 C) Oral - - - -  11/24/16 1915 139/72 - - (!) 114 19 96 % -  11/24/16 1700 (!) 162/73 99.2 F (37.3 C) Axillary (!) 122 18 93 % -  11/24/16 1600 (!) 155/65 98.6 F (37 C) Oral 88 18 97 % -  11/24/16 1516 (!) 151/65 - - 87 18 96 % -  11/24/16 1500 (!) 151/65 - - 91 18 96 % -  11/24/16 1400 - - - 96  18 93 % -  11/24/16 1300 (!) 115/51 - - 89 18 97 % -  11/24/16 1202 - 98.3 F (36.8 C) Oral (!) 113 18 95 % -  11/24/16 1200 (!) 111/53 - - (!) 115 18 94 % -  11/24/16 1100 (!) 103/51 - - (!) 111 18 96 % -  11/24/16 1000 (!) 113/54 - - (!) 111 18 94 % -  11/24/16 0900 (!) 123/46 - - 96 18 95 % -    As above    Recent Labs  11/24/16 1824 11/25/16 0400  WBC 7.2 4.7  HGB 7.8* 7.1*  HCT 22.9* 21.5*  PLT 168 144*    Recent Labs  11/24/16 0329 11/25/16 0400  NA 144 142  K 4.6 3.3*  CL 114* 114*  CO2 23 24  GLUCOSE 91 97  BUN 21* 18  CREATININE 0.85 0.73  CALCIUM 8.3* 7.9*    Blood smear review:  None  Pathology: None     Assessment and Plan: Ms. Ramseyer is a very nice 66 year old postmenopausal  white female. She has metastatic breast cancer.  I think the fact that her cancer is estrogen positive is definitely a huge "plus" for her.  I probably would get her started on intensive antiestrogen therapy with Faslodex along with one of the new  CDK4/CDK6. I think that this would be very helpful and effective.  Possibly, another option to consider would be some form of intrahepatic therapy since her disease is confined to the liver. Possibly, getting interventional radiology to weigh in on this mode of therapy might be helpful. She clearly is nowhere near being able to take anything like this however.  Again, once we get her extubated, then we can talk about therapy.  I left a message with her husband on his cell phone this morning.  We will follow along.  I know that she will get incredible care by all the staff in the ICU.  [name], MD  Darlyn Chamber 17:14

## 2016-11-25 NOTE — Progress Notes (Signed)
Daily Rounding Note  11/25/2016, 8:13 AM  LOS: 2 days   SUBJECTIVE:   Chief complaint: Hematemesis.  Continues to put burgundy bloody outut via NGT.  Total volume yesterday was 147ml, today: 15 ml in canister.  No BMs.  additonal PRBC ordered for this AM.  Remains intubated, sedated with Fentanyl.   Just got Labetolol for tachycardia    OBJECTIVE:         Vital signs in last 24 hours:    Temp:  [98.3 F (36.8 C)-99.4 F (37.4 C)] 98.6 F (37 C) (03/10 0400) Pulse Rate:  [87-124] 124 (03/10 0600) Resp:  [17-23] 23 (03/10 0600) BP: (103-178)/(46-85) 170/85 (03/10 0600) SpO2:  [91 %-98 %] 98 % (03/10 0600) FiO2 (%):  [30 %-40 %] 30 % (03/10 0400) Weight:  [115 kg (253 lb 8.5 oz)] 115 kg (253 lb 8.5 oz) (03/10 0500)   Filed Weights   11/23/16 1800 11/24/16 1957 11/25/16 0500  Weight: 112.6 kg (248 lb 3.8 oz) 115 kg (253 lb 8.5 oz) 115 kg (253 lb 8.5 oz)   General: intubated, obese   Heart: tachy into 120s Chest: clear bil in front, breathing quietly on vent.   Abdomen: obese, slightly distended and minimal to moderately tense.  BS hypoactive.  No tinkling or tympanitic sounds.  Laparoscopic incisions intact.  Bruising in lower abdomen  Extremities: non pitting UE swelling.   Neuro/Psych:  Sedated, did not awaken to exam.    Intake/Output from previous day: 03/09 0701 - 03/10 0700 In: 3504.2 [I.V.:2276.2; HENID:7824; NG/GT:30; IV Piggyback:50] Out: 2353 [Urine:1200; Emesis/NG output:140]  Intake/Output this shift: No intake/output data recorded.  Lab Results:  Recent Labs  11/24/16 1430 11/24/16 1824 11/25/16 0400  WBC 7.2 7.2 4.7  HGB 7.5* 7.8* 7.1*  HCT 22.8* 22.9* 21.5*  PLT 167 168 144*   BMET  Recent Labs  11/23/16 0944 11/23/16 0953 11/24/16 0329 11/25/16 0400  NA 139 141 144 142  K 4.6 4.6 4.6 3.3*  CL 109 108 114* 114*  CO2 23  --  23 24  GLUCOSE 127* 132* 91 97  BUN 18 18 21* 18    CREATININE 0.86 0.80 0.85 0.73  CALCIUM 8.2*  --  8.3* 7.9*   LFT  Recent Labs  11/23/16 0944  PROT 5.9*  ALBUMIN 2.4*  AST 154*  ALT 60*  ALKPHOS 244*  BILITOT 3.3*   PT/INR  Recent Labs  11/23/16 0944 11/24/16 1430  LABPROT 19.9* 20.5*  INR 1.67 1.74   Hepatitis Panel No results for input(s): HEPBSAG, HCVAB, HEPAIGM, HEPBIGM in the last 72 hours.  Studies/Results: Ct Abdomen Pelvis Wo Contrast  Result Date: 11/23/2016 CLINICAL DATA:  Abdominal pain. Breast cancer, metastatic to the liver EXAM: CT ABDOMEN AND PELVIS WITHOUT CONTRAST TECHNIQUE: Multidetector CT imaging of the abdomen and pelvis was performed following the standard protocol without IV contrast. COMPARISON:  PET CT 11/15/2016 FINDINGS: Lower chest: Small left pleural effusion, slightly increased in size since prior PET CT. Bibasilar opacities, likely atelectasis. Heart is normal size. Small hiatal hernia. Hepatobiliary: Changes of cirrhosis. Previously seen extensive metastatic disease throughout the liver is difficult to appreciate on this unenhanced study. Gallbladder unremarkable. Pancreas: No focal abnormality or ductal dilatation. Spleen: No focal abnormality.  Normal size. Adrenals/Urinary Tract: No adrenal abnormality. No focal renal abnormality. No stones or hydronephrosis. Urinary bladder is unremarkable. Stomach/Bowel: No evidence of aneurysm or adenopathy. Scattered aortic calcifications Vascular/Lymphatic: There is wall thickening within  the cecum and ascending colon to the level of the proximal transverse colon. This is concerning for colitis. Stomach and small bowel decompressed. Reproductive: Prior hysterectomy.  No adnexal masses. Other: Small amount of free fluid adjacent to the liver. No free air. Musculoskeletal: No acute bony abnormality. IMPRESSION: Changes of cirrhosis. Previously demonstrated metastatic disease to the liver is difficult to visualize on this unenhanced study. Small amount of  perihepatic ascites.  Small left pleural effusion. Wall thickening and surrounding inflammatory stranding involving the cecum, ascending colon and proximal transverse colon concerning for colitis. Small hiatal hernia. Bibasilar atelectasis. Electronically Signed   By: Rolm Baptise M.D.   On: 11/23/2016 10:21   Dg Chest Port 1 View  Result Date: 11/25/2016 CLINICAL DATA:  Acute respiratory failure with hypoxia EXAM: PORTABLE CHEST 1 VIEW COMPARISON:  Chest x-rays dated 11/24/2016 and 11/23/2016. FINDINGS: Study is hypoinspiratory with crowding of the perihilar and bibasilar bronchovascular markings. Suspect central pulmonary vascular congestion and mild interstitial edema, without overt alveolar pulmonary edema. Probable small bilateral pleural effusions. Heart size and mediastinal contours appear stable. Endotracheal tube appears well positioned with tip just above the level of the carina. Right IJ central line appears appropriately positioned with tip at the level of the lower SVC. Enteric tube passes below the diaphragm. No pneumothorax seen. IMPRESSION: 1. Low lung volumes. Probable small bilateral pleural effusions. Mild pulmonary vascular congestion and interstitial edema, suggesting mild volume overload, without overt alveolar pulmonary edema. 2. Support apparatus appears appropriately positioned. Electronically Signed   By: Franki Cabot M.D.   On: 11/25/2016 08:03   Dg Chest Port 1 View  Result Date: 11/24/2016 CLINICAL DATA:  Acute respiratory failure EXAM: PORTABLE CHEST 1 VIEW COMPARISON:  11/23/2016 FINDINGS: The endotracheal tube has been repositioned and is 1 cm above the carina. The nasogastric tube extends into the stomach. The right jugular central line terminates in the low SVC. Left base consolidation persists without significant interval change. No pneumothorax. IMPRESSION: 1.  Support equipment appears satisfactorily positioned. 2. Unchanged left base consolidation. Electronically Signed    By: Andreas Newport M.D.   On: 11/24/2016 05:24   Dg Chest Portable 1 View  Result Date: 11/23/2016 CLINICAL DATA:  Post intubation. History of metastatic breast cancer, cirrhosis. EXAM: PORTABLE CHEST 1 VIEW COMPARISON:  Chest radiograph November 07, 2016 FINDINGS: Cardiac silhouette is mildly enlarged, improved from prior radiograph. Tortuous, mildly calcified aorta. Persistently elevated RIGHT hemidiaphragm with low lung volumes. Chronic interstitial changes with strandy densities in lung bases. No pleural effusion. No pneumothorax. RIGHT mainstem bronchus intubation. RIGHT internal jugular central venous catheter projects at cavoatrial junction. Surgical clips RIGHT breast. Osseous structures are nonsuspicious. IMPRESSION: RIGHT mainstem bronchus intubation, recommend 2-3 cm retraction. RIGHT IJ central venous catheter distal tip at cavoatrial junction. No pneumothorax. Mild cardiomegaly, mild chronic interstitial changes with bibasilar atelectasis/ scarring. Acute findings discussed with and reconfirmed by Dr.PEDRO CARDAMA on 11/23/2016 at 1:55 pm. Electronically Signed   By: Elon Alas M.D.   On: 11/23/2016 13:56   Dg Abd Portable 1v  Result Date: 11/23/2016 CLINICAL DATA:  OG tube placement at bedside. EXAM: PORTABLE ABDOMEN - 1 VIEW COMPARISON:  CT abdomen pelvis earlier today and 11/15/2016. FINDINGS: OG tube tip in the distal body of the stomach. Mild gaseous distention of the visualized transverse colon with moderate stool burden. No other distended bowel. IMPRESSION: 1. OG tube tip in the distal body of the stomach. 2. Probable mild colonic ileus. Electronically Signed   By: Marcello Moores  Lawrence M.D.   On: 11/23/2016 17:34    ASSESMENT:   *  Massive ugib in pt s/p nissen 2/16  3/8 EGD: actively bleeding large ulcer at [place] was clipped.  Poor visualization due to abundance of blood, clots.   *  ABL anemia.  S/p PRBC x 1on 3/8.  Blood counts drifting lower. Additional PRBC ordered.   *   Coagulopathy.  S/p FFP x 6.    *  resp failure in setting hemorrhagic shock, intubated 3/8  *  Elevated LFTs,.due to liver mets?  CT suggested cirrhosis but path did not confirm cirrhosis. (? If gross  appearance due to overwhelming mets?  (fatty liver on 2016 CT) .   *  Thrombocytopenia.   *  Colonic wall thickening per CT: cecum, ascending, prox transverse. Colonic ileus   *  Hypokalemia.  Supplementation ordered.   *  Hx breast cancer, previously felt to be in remission,  found to have liver mets (unexpected) on intraop liver biospy 2/16.     PLAN   *  EGD today vs tomorrow, at bedside.  Will place orders in depot.     Azucena Freed  11/25/2016, 8:13 AM Pager: [phone]

## 2016-11-25 NOTE — Op Note (Signed)
[facility] Patient Name: Alexandria [name] Procedure Date: 11/25/2016 MRN: [id number] Attending MD: Carlota Raspberry. Armbruster MD, MD Date of Birth: [birth year] CSN: [id number] Age: 66 Admit Type: Inpatient Procedure:                Upper GI endoscopy Indications:              Hematemesis, history of severe upper GI bleed                            thought to be due to ulceration in distal                            esophagus, worsening anemia, relook endoscopy to                            ensure no active bleeding Providers:                Carlota Raspberry. Havery Moros, MD, Carolynn Comment, RN,                            Corliss Parish, Technician Referring MD:              Medicines:                Fentanyl 50 micrograms IV, Midazolam 5 mg IV Complications:            No immediate complications. Estimated blood loss:                            None. Estimated Blood Loss:     Estimated blood loss: none. Procedure:                Pre-Anesthesia Assessment:                           - Prior to the procedure, a History and Physical                            was performed, and patient medications and                            allergies were reviewed. The patient's tolerance of                            previous anesthesia was also reviewed. The risks                            and benefits of the procedure and the sedation                            options and risks were discussed with the patient.                            All questions were answered, and informed consent  was obtained. Prior Anticoagulants: The patient has                            taken no previous anticoagulant or antiplatelet                            agents. ASA Grade Assessment: III - A patient with                            severe systemic disease. After reviewing the risks                            and benefits, the patient was deemed in                            satisfactory  condition to undergo the procedure.                           After obtaining informed consent, the endoscope was                            passed under direct vision. Throughout the                            procedure, the patient's blood pressure, pulse, and                            oxygen saturations were monitored continuously. The                            EG-[id number] ([id number]) scope was introduced through the                            mouth, and advanced to the second part of duodenum.                            The upper GI endoscopy was accomplished without                            difficulty. The patient tolerated the procedure                            well. Scope In: Scope Out: Findings:      Esophagogastric landmarks were identified: the Z-line was found at 30 cm       from the incisors.      One large esophageal ulceration, at least a few cm in size, with no       bleeding and no stigmata of recent bleeding was found at the surgical       anastomosis / SCJ and just proximal to it. 3 old hemostasis clips were       noted at the 3 o'clock position where active bleeding was previously       noted. No nodularity was noted in the area (suspect previously this  lesion was a visible vessel)      A medium-sized hiatal hernia was present. There was an outpouching of       gastric lumen within the hernia sac just inferior to the SCJ, consistent       with post surgical Nissen which did not appear intact. This outpouching       did not contain any stigmata for bleeding.      Old hemetin was found in the gastric body and in the gastric antrum, but       no pathology was noted in the stomach to cause bleeding. No active       bleeding in the stomach.      The Nissen was not intact and appeared loose. The exam of the stomach       was otherwise normal.      The duodenal bulb and second portion of the duodenum were normal. No       blood noted anywhere in the small  bowel. Impression:               - Esophagogastric landmarks identified.                           - Large esophageal ulceration at surgical                            anastomosis / distal eosphagus with previously                            placed hemostasis clips intact. No active bleeding                            or high risk stigmata for bleeding noted                           - Medium-sized hiatal hernia with loose Nissen and                            outpouching of gastric lumen at surgical site.                           - Old in the gastric antrum and in the gastric                            body, no stigmata for bleeding appreciated in the                            stomach.                           - Normal duodenal bulb and second portion of the                            duodenum.                           Overall, suspect patient had bleeding at large  ulceration at surgical anastomosis. Nissen is not                            intact and suspect bleeding from ulceration, and                            perhaps sutures were ruptured from patient's                            vomiting post-operatively. Given her underlying                            malignancy she is also at risk for poor wound                            healing. No active bleeding noted during this exam. Moderate Sedation:      Moderate (conscious) sedation was administered by the endoscopy nurse       and supervised by the endoscopist. The following parameters were       monitored: oxygen saturation, heart rate, blood pressure, and response       to care. Total physician intraservice time was 10 minutes. Recommendation:           - Continue ICU stay ongoing care.                           - NPO for now                           - I pulled the OG, do not need to replace it (do                            not want it to disrupt clips in place)                           - No active  bleeding appreciated, monitor patient                            for recurrence of bleeding, await post-transfusion                            Hgb                           - FFP for coagulopathy if bleeding                           - Continue IV protonix drip                           - GI service will continue to follow. I discussed                            findings with patient and surgical team present for  this case. Procedure Code(s):        --- Professional ---                           [phone], Esophagogastroduodenoscopy, flexible,                            transoral; diagnostic, including collection of                            specimen(s) by brushing or washing, when performed                            (separate procedure)                           99152, Moderate sedation services provided by the                            same physician or other qualified health care                            professional performing the diagnostic or                            therapeutic service that the sedation supports,                            requiring the presence of an independent trained                            observer to assist in the monitoring of the                            patient's level of consciousness and physiological                            status; initial 15 minutes of intraservice time,                            patient age 56 years or older Diagnosis Code(s):        --- Professional ---                           K22.10, Ulcer of esophagus without bleeding                           K44.9, Diaphragmatic hernia without obstruction or                            gangrene                           K92.2, Gastrointestinal hemorrhage, unspecified                           K92.0,  Hematemesis CPT copyright 2016 [employer]. All rights reserved. The codes documented in this report are preliminary and upon coder review may   be revised to meet current compliance requirements. Remo Lipps P. Armbruster MD, MD 11/25/2016 1:03:27 PM This report has been signed electronically. Number of Addenda: 0

## 2016-11-25 NOTE — Progress Notes (Signed)
Patient tolerated EGD procedure fairly well. She was somewhat agitated during the case. She received two 25 mcg boluses during case per Dr. Havery Moros and 5mg  Versed total to help make her more comfortable. Vital signs remained stable throughout the entire case. We did not see any active bleeding. Report was given to ICU, RN.

## 2016-11-25 NOTE — Progress Notes (Signed)
PULMONARY / CRITICAL CARE MEDICINE   Name: Alexandria [name] MRN: [id number] DOB: 1951-08-19    ADMISSION DATE:  11/23/2016 CONSULTATION DATE:  11/23/16  REFERRING MD:  Dr. Leonette Monarch / EDP   CHIEF COMPLAINT:  GIB, AMS  SUMMARY:  66 y/o F with new diagnosis of metastatic breast cancer to the liver admitted 3/8 with acute upper GIB in the setting of a large ulceration at the GEJ with active bleeding.    SUBJECTIVE: k low,  Oncology saw pt, remains on cent, pos 2 liters, plan repeat endo by GI, anemia remains, OGT remains with blood  VITAL SIGNS: BP (!) 170/85   Pulse (!) 124   Temp 98.6 F (37 C) (Axillary)   Resp (!) 23   Ht 5\' 6"  (1.676 m)   Wt 115 kg (253 lb 8.5 oz)   LMP 01/30/2005   SpO2 98%   BMI 40.92 kg/m   HEMODYNAMICS:    VENTILATOR SETTINGS: Vent Mode: PRVC FiO2 (%):  [30 %-40 %] 30 % Set Rate:  [18 bmp] 18 bmp Vt Set:  [500 mL] 500 mL PEEP:  [5 cmH20] 5 cmH20 Plateau Pressure:  [15 cmH20-17 cmH20] 16 cmH20  INTAKE / OUTPUT: I/O last 3 completed shifts: In: 6211.8 [I.V.:3492.5; Blood:2409.3; NG/GT:210; IV Piggyback:100] Out: 1985 [Urine:1695; Emesis/NG output:290]  PHYSICAL EXAMINATION: General: morbidly obese female in NAD lying in bed HEENT: jvd hard to tell, ett Neuro: sedate, rass -1 CV: s1s2 rrr, no m/r/g PULM:  coarse OJ:JKKX, non-tender, bsx4 active  Extremities: warm/dry, trace LE edema  Skin: no rashes or lesions   LABS:  BMET  Recent Labs Lab 11/23/16 0944 11/23/16 0953 11/24/16 0329 11/25/16 0400  NA 139 141 144 142  K 4.6 4.6 4.6 3.3*  CL 109 108 114* 114*  CO2 23  --  23 24  BUN 18 18 21* 18  CREATININE 0.86 0.80 0.85 0.73  GLUCOSE 127* 132* 91 97    Electrolytes  Recent Labs Lab 11/23/16 0944 11/24/16 0329 11/25/16 0400  CALCIUM 8.2* 8.3* 7.9*  MG  --  1.8  --   PHOS  --  2.8  --     CBC  Recent Labs Lab 11/24/16 1430 11/24/16 1824 11/25/16 0400  WBC 7.2 7.2 4.7  HGB 7.5* 7.8* 7.1*  HCT 22.8* 22.9* 21.5*   PLT 167 168 144*    Coag's  Recent Labs Lab 11/23/16 0944 11/24/16 1430  INR 1.67 1.74    Sepsis Markers  Recent Labs Lab 11/23/16 0953  LATICACIDVEN 2.73*    ABG  Recent Labs Lab 11/23/16 1515 11/24/16 0428  PHART 7.233* 7.394  PCO2ART 44.7 39.9  PO2ART 277* 80.9*    Liver Enzymes  Recent Labs Lab 11/23/16 0944  AST 154*  ALT 60*  ALKPHOS 244*  BILITOT 3.3*  ALBUMIN 2.4*    Cardiac Enzymes No results for input(s): TROPONINI, PROBNP in the last 168 hours.  Glucose No results for input(s): GLUCAP in the last 168 hours.  Imaging No results found.   STUDIES:  EGD 3/8 >> large ulceration at GEJ with active bleeding  CULTURES:   ANTIBIOTICS: Ceftriaxone 3/9 >>   SIGNIFICANT EVENTS: 3/08  Admit with GIB, hypotension/shock, endo- clip placed on bleed  LINES/TUBES: ETT 3/8 >>  R IJ TLC 3/8 >>   DISCUSSION: 66 y/o F with recent Nissen Fundoplication and incidental finding of metastatic breast cancer admitted with acute GIB, hemorrhagic shock s/p EGD with clipping + epi.    ASSESSMENT / PLAN:  PULMONARY A: Acute Respiratory Failure in setting of Hemorrhagic Shock  L Basilar Consolidation P:   under penetrated pcxr, repeat in am possible increase in edema For repeat endo prior to extubation SBT okay, no ectubation until bleeding determined to be resolved and lower risk re bleed Was pos 2 liters, consider even balance ABG last reviewed, keep same MV  CARDIOVASCULAR A:  Hemorrhagic Shock in setting of GIB - resolved.  Hx HTN, HLD, dCHF AF  P:   Ensure kvo fluids, she remains off pressors May need lasix to even balance Get cvp DNR in event of arrest  RENAL A:   At Risk AKI - in setting of volume depletion / hypotension  Hypo K  P:   k supp Chem in am kvo lasix  GASTROINTESTINAL A:   Acute GIB in setting of Gastric Ulcer with Perforation  Metastatic Breast Cancer to Liver  Hx Hiatal Hernia s/p Nissen Fundoplication  P:    NPO For repeat endo likely  HEMATOLOGIC / ONC  A:   Acute Blood Loss Anemia  Breast Cancer with Metastasis to Liver  Coagulapthy, liver? Consumption>? P:  Tx unit prbc Cbc frequent Follow output SCD's for DVT prophylaxis  coags now  INFECTIOUS A:   LLL Consolidation - ? Aspiration  P:   CTX remain  ENDOCRINE A:   At Risk Hypo/Hyperglycemia    P:   Trend CBG  NEUROLOGIC A:   Acute Encephalopathy in setting of GIB  P:   RASS goal: 0 to -1  Fentanyl gtt for pain  PRN versed for sedation - may want to dc this   FAMILY  - Updates:  No family here  - Inter-disciplinary family meet or Palliative Care meeting due by: Completed on admit.    Ccm time 30 min   Lavon Paganini. Titus Mould, MD, [place] Pgr: [facility]

## 2016-11-26 ENCOUNTER — Encounter (HOSPITAL_COMMUNITY): Payer: Self-pay | Admitting: Gastroenterology

## 2016-11-26 ENCOUNTER — Inpatient Hospital Stay (HOSPITAL_COMMUNITY): Payer: Medicare Other

## 2016-11-26 DIAGNOSIS — Z0189 Encounter for other specified special examinations: Secondary | ICD-10-CM

## 2016-11-26 LAB — CBC
HCT: 23.6 % — ABNORMAL LOW (ref 36.0–46.0)
Hemoglobin: 8.1 g/dL — ABNORMAL LOW (ref 12.0–15.0)
MCH: 28 pg (ref 26.0–34.0)
MCHC: 34.3 g/dL (ref 30.0–36.0)
MCV: 81.7 fL (ref 78.0–100.0)
PLATELETS: 132 10*3/uL — AB (ref 150–400)
RBC: 2.89 MIL/uL — AB (ref 3.87–5.11)
RDW: 17.2 % — ABNORMAL HIGH (ref 11.5–15.5)
WBC: 4.9 10*3/uL (ref 4.0–10.5)

## 2016-11-26 LAB — BASIC METABOLIC PANEL
ANION GAP: 7 (ref 5–15)
ANION GAP: 7 (ref 5–15)
BUN: 16 mg/dL (ref 6–20)
BUN: 14 mg/dL (ref 6–20)
CALCIUM: 8.3 mg/dL — AB (ref 8.9–10.3)
CALCIUM: 8.1 mg/dL — AB (ref 8.9–10.3)
CO2: 24 mmol/L (ref 22–32)
CO2: 25 mmol/L (ref 22–32)
Chloride: 116 mmol/L — ABNORMAL HIGH (ref 101–111)
Chloride: 110 mmol/L (ref 101–111)
Creatinine, Ser: 0.7 mg/dL (ref 0.44–1.00)
Creatinine, Ser: 0.84 mg/dL (ref 0.44–1.00)
Glucose, Bld: 86 mg/dL (ref 65–99)
Glucose, Bld: 113 mg/dL — ABNORMAL HIGH (ref 65–99)
Potassium: 3.3 mmol/L — ABNORMAL LOW (ref 3.5–5.1)
Potassium: 3.1 mmol/L — ABNORMAL LOW (ref 3.5–5.1)
SODIUM: 142 mmol/L (ref 135–145)
Sodium: 147 mmol/L — ABNORMAL HIGH (ref 135–145)

## 2016-11-26 LAB — CBC WITH DIFFERENTIAL/PLATELET
BASOS ABS: 0 10*3/uL (ref 0.0–0.1)
Basophils Relative: 0 %
EOS ABS: 0.1 10*3/uL (ref 0.0–0.7)
Eosinophils Relative: 3 %
HCT: 23.1 % — ABNORMAL LOW (ref 36.0–46.0)
HEMOGLOBIN: 7.7 g/dL — AB (ref 12.0–15.0)
LYMPHS PCT: 23 %
Lymphs Abs: 0.9 10*3/uL (ref 0.7–4.0)
MCH: 27.4 pg (ref 26.0–34.0)
MCHC: 33.3 g/dL (ref 30.0–36.0)
MCV: 82.2 fL (ref 78.0–100.0)
Monocytes Absolute: 0.8 10*3/uL (ref 0.1–1.0)
Monocytes Relative: 20 %
NEUTROS ABS: 2.2 10*3/uL (ref 1.7–7.7)
NEUTROS PCT: 54 %
PLATELETS: 109 10*3/uL — AB (ref 150–400)
RBC: 2.81 MIL/uL — ABNORMAL LOW (ref 3.87–5.11)
RDW: 17.2 % — ABNORMAL HIGH (ref 11.5–15.5)
WBC: 4 10*3/uL (ref 4.0–10.5)

## 2016-11-26 LAB — GLUCOSE, CAPILLARY: Glucose-Capillary: 84 mg/dL (ref 65–99)

## 2016-11-26 MED ORDER — POTASSIUM CHLORIDE 10 MEQ/100ML IV SOLN
10.0000 meq | INTRAVENOUS | Status: AC
  Administered 2016-11-26 (×6): 10 meq via INTRAVENOUS
  Filled 2016-11-26 (×7): qty 100

## 2016-11-26 MED ORDER — FUROSEMIDE 10 MG/ML IJ SOLN
40.0000 mg | Freq: Two times a day (BID) | INTRAMUSCULAR | Status: DC
  Administered 2016-11-26 – 2016-11-27 (×3): 40 mg via INTRAVENOUS
  Filled 2016-11-26 (×3): qty 4

## 2016-11-26 MED ORDER — SODIUM CHLORIDE 0.9 % IV SOLN
30.0000 meq | Freq: Once | INTRAVENOUS | Status: AC
  Administered 2016-11-26: 30 meq via INTRAVENOUS
  Filled 2016-11-26: qty 15

## 2016-11-26 MED ORDER — DEXTROSE 5 % IV SOLN
INTRAVENOUS | Status: DC
  Administered 2016-11-26 – 2016-11-30 (×3): via INTRAVENOUS

## 2016-11-26 NOTE — Progress Notes (Signed)
PULMONARY / CRITICAL CARE MEDICINE   Name: Alexandria [name] MRN: [id number] DOB: 08/20/1951    ADMISSION DATE:  11/23/2016 CONSULTATION DATE:  11/23/16  REFERRING MD:  Dr. Leonette Monarch / EDP   CHIEF COMPLAINT:  GIB, AMS  SUMMARY:  66 y/o F with new diagnosis of metastatic breast cancer to the liver admitted 3/8 with acute upper GIB in the setting of a large ulceration at the GEJ with active bleeding.    SUBJECTIVE: EGD repeat- no bleeding, k supp done, needing more  VITAL SIGNS: BP (!) 116/53   Pulse 85   Temp 99.1 F (37.3 C) (Axillary)   Resp 18   Ht 5\' 6"  (1.676 m)   Wt 120.8 kg (266 lb 5.1 oz)   LMP 01/30/2005   SpO2 97%   BMI 42.98 kg/m   HEMODYNAMICS: CVP:  [9 mmHg-15 mmHg] 11 mmHg  VENTILATOR SETTINGS: Vent Mode: PSV;CPAP FiO2 (%):  [30 %] 30 % Set Rate:  [18 bmp] 18 bmp Vt Set:  [500 mL] 500 mL PEEP:  [5 cmH20] 5 cmH20 Pressure Support:  [5 cmH20] 5 cmH20 Plateau Pressure:  [18 cmH20-20 cmH20] 18 cmH20  INTAKE / OUTPUT: I/O last 3 completed shifts: In: 5726.8 [I.V.:3341.8; Blood:2385] Out: 1505 [LKTGY:5638]  PHYSICAL EXAMINATION: General: morbidly obese female in NAD HEENT: ett Neuro: sedate, rass -1, not fc CV: s1s2 rrr, no m/r/g PULM:   CTA LH:TDSK, non-tender, bsx4 active, some idstention Extremities: mild to increasing edema  Skin: no rashes or lesions   LABS:  BMET  Recent Labs Lab 11/24/16 0329 11/25/16 0400 11/26/16 0438  NA 144 142 147*  K 4.6 3.3* 3.3*  CL 114* 114* 116*  CO2 23 24 24   BUN 21* 18 16  CREATININE 0.85 0.73 0.70  GLUCOSE 91 97 86    Electrolytes  Recent Labs Lab 11/24/16 0329 11/25/16 0400 11/26/16 0438  CALCIUM 8.3* 7.9* 8.3*  MG 1.8  --   --   PHOS 2.8  --   --     CBC  Recent Labs Lab 11/25/16 1544 11/25/16 2303 11/26/16 0438  WBC 4.7 4.2 4.0  HGB 8.5* 7.7* 7.7*  HCT 25.1* 22.7* 23.1*  PLT 136* 122* 109*    Coag's  Recent Labs Lab 11/23/16 0944 11/24/16 1430 11/25/16 1018  APTT  --   --   51*  INR 1.67 1.74 2.10    Sepsis Markers  Recent Labs Lab 11/23/16 0953  LATICACIDVEN 2.73*    ABG  Recent Labs Lab 11/23/16 1515 11/24/16 0428  PHART 7.233* 7.394  PCO2ART 44.7 39.9  PO2ART 277* 80.9*    Liver Enzymes  Recent Labs Lab 11/23/16 0944  AST 154*  ALT 60*  ALKPHOS 244*  BILITOT 3.3*  ALBUMIN 2.4*    Cardiac Enzymes No results for input(s): TROPONINI, PROBNP in the last 168 hours.  Glucose  Recent Labs Lab 11/25/16 0955 11/26/16 0742  GLUCAP 112* 84    Imaging Dg Chest Port 1 View  Result Date: 11/26/2016 CLINICAL DATA:  Acute respiratory failure EXAM: PORTABLE CHEST 1 VIEW COMPARISON:  Chest radiograph from one day prior. FINDINGS: Endotracheal tube tip is 0.7 cm above the carina. Right internal jugular central venous catheter terminates in the lower third of the superior vena cava. Stable cardiomediastinal silhouette with top-normal heart size. No pneumothorax. No pleural effusion. Low lung volumes. Patchy bibasilar lung opacities appear stable. No pulmonary edema. Surgical clips overlie the right axilla and esophagogastric junction region. IMPRESSION: 1. Low lying endotracheal tube with  tip 0.7 cm above the carina, recommend retracting 1-2 cm. 2. No pneumothorax. 3. Low lung volumes with stable patchy bibasilar lung opacities, favor atelectasis. These results were called by telephone at the time of interpretation on 11/26/2016 at 7:16 am to [facility], who verbally acknowledged these results. Electronically Signed   By: Ilona Sorrel M.D.   On: 11/26/2016 07:18     STUDIES:  EGD 3/8 >> large ulceration at GEJ with active bleeding  CULTURES:   ANTIBIOTICS: Ceftriaxone 3/9 >>   SIGNIFICANT EVENTS: 3/08  Admit with GIB, hypotension/shock, endo- clip placed on bleed  LINES/TUBES: ETT 3/8 >>  R IJ TLC 3/8 >>   DISCUSSION: 66 y/o F with recent Nissen Fundoplication and incidental finding of metastatic breast cancer admitted with acute  GIB, hemorrhagic shock s/p EGD with clipping + epi.    ASSESSMENT / PLAN:  PULMONARY A: Acute Respiratory Failure in setting of Hemorrhagic Shock  L Basilar Consolidation \\int  edema increasing? P:   concern pos balance , small lung volumes vs int edema, on pcxr, consider lasix pcxr in am  Keep same TV, rate SBT is plan, with aggressive WUA Wean cpap5 ps 5 goal 1 hr Need more arousable state to extubate Given egd 2, would extubate if able  CARDIOVASCULAR A:  Hemorrhagic Shock in setting of GIB - resolved.  Hx HTN, HLD, dCHF AF  P:   lasix Get cvp- 11, kvo DNR in event of arrest  RENAL A:   At Risk AKI - in setting of volume depletion / hypotension  Hypo K  hypernatremia P:   k supp, more aggressive needed Chem in am kvo Lasix start and add d5w at 30  GASTROINTESTINAL A:   Acute GIB in setting of Gastric Ulcer with Perforation , egd x 2, neg Metastatic Breast Cancer to Liver  Hx Hiatal Hernia s/p Nissen Fundoplication  P:   NPO Repeat egd done  HEMATOLOGIC / ONC  A:   Acute Blood Loss Anemia  Breast Cancer with Metastasis to Liver  Coagulapthy, liver? Consumption>? P:  Cbc in am  onc saw pt SCD's for DVT prophylaxis  coags repeat in am , got ffp  INFECTIOUS A:   LLL Consolidation - ? Aspiration  - no evidence, no fevers P:   CTX - dc  ENDOCRINE A:   At Risk Hypo/Hyperglycemia    P:   Trend CBG  NEUROLOGIC A:   Acute Encephalopathy in setting of GIB  P:   RASS goal: 0 to -1  Fentanyl gtt for pain - full WUA   FAMILY  - Updates:  No family here, I am too call son  - Inter-disciplinary family meet or Palliative Care meeting due by: Completed on admit.    Ccm time 30 min   Lavon Paganini. Titus Mould, MD, [place] Pgr: [place] [facility]

## 2016-11-26 NOTE — Progress Notes (Signed)
Daily Rounding Note  11/26/2016, 8:13 AM  LOS: 3 days   SUBJECTIVE:   Chief complaint: hematemesis.   No emesis, no stools.    Vent wean in process: now just on pressure support.    OBJECTIVE:         Vital signs in last 24 hours:    Temp:  [98.5 F (36.9 C)-99.3 F (37.4 C)] 99.1 F (37.3 C) (03/11 0730) Pulse Rate:  [72-152] 85 (03/11 0735) Resp:  [16-26] 18 (03/11 0735) BP: (116-191)/(53-96) 116/53 (03/11 0600) SpO2:  [92 %-99 %] 97 % (03/11 0735) FiO2 (%):  [30 %] 30 % (03/11 0735) Weight:  [120.8 kg (266 lb 5.1 oz)] 120.8 kg (266 lb 5.1 oz) (03/11 0100)   Filed Weights   11/24/16 1957 11/25/16 0500 11/26/16 0100  Weight: 115 kg (253 lb 8.5 oz) 115 kg (253 lb 8.5 oz) 120.8 kg (266 lb 5.1 oz)   General: looks ill   Heart: RRR Chest: ETT in place. Clear bil.  Purulent looking discharge in subglottic tube. Abdomen: obese, distended, bruising in lower abdomen.  BS scant but no tinkling or tympanitic sounds.   Extremities: UE edema Neuro/Psych:  Did not awaken to my exam or voice.  Spontaneous limb movement.   Intake/Output from previous day: 03/10 0701 - 03/11 0700 In: 2558.4 [I.V.:1321.4; Blood:1237] Out: 905 [Urine:905]  Intake/Output this shift: No intake/output data recorded.  Lab Results:  Recent Labs  11/25/16 1544 11/25/16 2303 11/26/16 0438  WBC 4.7 4.2 4.0  HGB 8.5* 7.7* 7.7*  HCT 25.1* 22.7* 23.1*  PLT 136* 122* 109*   BMET  Recent Labs  11/24/16 0329 11/25/16 0400 11/26/16 0438  NA 144 142 147*  K 4.6 3.3* 3.3*  CL 114* 114* 116*  CO2 23 24 24   GLUCOSE 91 97 86  BUN 21* 18 16  CREATININE 0.85 0.73 0.70  CALCIUM 8.3* 7.9* 8.3*   LFT  Recent Labs  11/23/16 0944  PROT 5.9*  ALBUMIN 2.4*  AST 154*  ALT 60*  ALKPHOS 244*  BILITOT 3.3*   PT/INR  Recent Labs  11/24/16 1430 11/25/16 1018  LABPROT 20.5* 23.9*  INR 1.74 2.10   Hepatitis Panel No results for  input(s): HEPBSAG, HCVAB, HEPAIGM, HEPBIGM in the last 72 hours.  Studies/Results: Dg Chest Port 1 View  Result Date: 11/26/2016 CLINICAL DATA:  Acute respiratory failure EXAM: PORTABLE CHEST 1 VIEW COMPARISON:  Chest radiograph from one day prior. FINDINGS: Endotracheal tube tip is 0.7 cm above the carina. Right internal jugular central venous catheter terminates in the lower third of the superior vena cava. Stable cardiomediastinal silhouette with top-normal heart size. No pneumothorax. No pleural effusion. Low lung volumes. Patchy bibasilar lung opacities appear stable. No pulmonary edema. Surgical clips overlie the right axilla and esophagogastric junction region. IMPRESSION: 1. Low lying endotracheal tube with tip 0.7 cm above the carina, recommend retracting 1-2 cm. 2. No pneumothorax. 3. Low lung volumes with stable patchy bibasilar lung opacities, favor atelectasis. These results were called by telephone at the time of interpretation on 11/26/2016 at 7:16 am to [facility], who verbally acknowledged these results. Electronically Signed   By: Ilona Sorrel M.D.   On: 11/26/2016 07:18   Dg Chest Port 1 View  Result Date: 11/25/2016 CLINICAL DATA:  Acute respiratory failure with hypoxia EXAM: PORTABLE CHEST 1 VIEW COMPARISON:  Chest x-rays dated 11/24/2016 and 11/23/2016. FINDINGS: Study is hypoinspiratory with crowding of the perihilar and  bibasilar bronchovascular markings. Suspect central pulmonary vascular congestion and mild interstitial edema, without overt alveolar pulmonary edema. Probable small bilateral pleural effusions. Heart size and mediastinal contours appear stable. Endotracheal tube appears well positioned with tip just above the level of the carina. Right IJ central line appears appropriately positioned with tip at the level of the lower SVC. Enteric tube passes below the diaphragm. No pneumothorax seen. IMPRESSION: 1. Low lung volumes. Probable small bilateral pleural effusions. Mild  pulmonary vascular congestion and interstitial edema, suggesting mild volume overload, without overt alveolar pulmonary edema. 2. Support apparatus appears appropriately positioned. Electronically Signed   By: Franki Cabot M.D.   On: 11/25/2016 08:03    ASSESMENT:   *  Massive ugib in pt s/p nissen 2/16  3/8 EGD: actively bleeding large ulcer at [place] was clipped.  Poor visualization due to abundance of blood, clots.  3/10 EGD: large ulcer at surgical anastomosis/distal esophagus (felt to be source of bleeding).  Previously placed clips intact.  Loose Nissen with medium HH, gastric outpouching at surgical site.  Suspect post op vomiting led to suture rupture, ulceration. OGT removed.   Completes PPI drip this afternoon.  Then BID IV protonix kicks in.    *  ABL anemia.  S/p PRBC x 1on 3/8, 1 on 3/10. Hgb stable.    *  Coagulopathy.  S/p FFP x 6.    *  resp failure in setting hemorrhagic shock, intubated 3/8  *  Elevated LFTs,due to liver mets?  CT suggested cirrhosis but path did not confirm cirrhosis. (? If gross  appearance due to overwhelming mets?).  Fatty liver on 2016 CT.   *  Thrombocytopenia.  Non-critical.    *  Colonic wall thickening per CT: cecum, ascending, prox transverse. Colonic ileus   *  Hypokalemia.    *  Hx breast cancer, previously felt to be in remission,  found to have liver mets (unexpected) on intraop liver biospy 2/16.      PLAN   *  ? When will be ok to start enteral feeding?     Alexandria [name]  11/26/2016, 8:13 AM Pager: [phone]

## 2016-11-26 NOTE — Progress Notes (Signed)
eLink Physician-Brief Progress Note Patient Name: Alexandria [name] DOB: 1951-06-25 MRN: [id number]   Date of Service  11/26/2016  HPI/Events of Note  K+ = 3.1 and Creatinine = 0.84.  eICU Interventions  Will replace K+.     Intervention Category Intermediate Interventions: Electrolyte abnormality - evaluation and management  Edouard Gikas Eugene 11/26/2016, 8:24 PM

## 2016-11-26 NOTE — Progress Notes (Signed)
Dr. Salvatore Marvel called and said to retract ETT by 1-2cm.  Informed Respiratory. Irven Baltimore, RN

## 2016-11-27 ENCOUNTER — Inpatient Hospital Stay (HOSPITAL_COMMUNITY): Payer: Medicare Other

## 2016-11-27 DIAGNOSIS — R748 Abnormal levels of other serum enzymes: Secondary | ICD-10-CM

## 2016-11-27 DIAGNOSIS — D62 Acute posthemorrhagic anemia: Secondary | ICD-10-CM

## 2016-11-27 DIAGNOSIS — E878 Other disorders of electrolyte and fluid balance, not elsewhere classified: Secondary | ICD-10-CM

## 2016-11-27 DIAGNOSIS — J9601 Acute respiratory failure with hypoxia: Secondary | ICD-10-CM

## 2016-11-27 DIAGNOSIS — G9340 Encephalopathy, unspecified: Secondary | ICD-10-CM

## 2016-11-27 LAB — CBC WITH DIFFERENTIAL/PLATELET
BASOS ABS: 0 10*3/uL (ref 0.0–0.1)
Basophils Relative: 0 %
Eosinophils Absolute: 0.1 10*3/uL (ref 0.0–0.7)
Eosinophils Relative: 2 %
HEMATOCRIT: 25.3 % — AB (ref 36.0–46.0)
Hemoglobin: 8.6 g/dL — ABNORMAL LOW (ref 12.0–15.0)
LYMPHS ABS: 1.2 10*3/uL (ref 0.7–4.0)
LYMPHS PCT: 21 %
MCH: 27.8 pg (ref 26.0–34.0)
MCHC: 34 g/dL (ref 30.0–36.0)
MCV: 81.9 fL (ref 78.0–100.0)
Monocytes Absolute: 1.2 10*3/uL — ABNORMAL HIGH (ref 0.1–1.0)
Monocytes Relative: 21 %
NEUTROS ABS: 3.1 10*3/uL (ref 1.7–7.7)
Neutrophils Relative %: 56 %
Platelets: 146 10*3/uL — ABNORMAL LOW (ref 150–400)
RBC: 3.09 MIL/uL — AB (ref 3.87–5.11)
RDW: 17.3 % — ABNORMAL HIGH (ref 11.5–15.5)
WBC: 5.7 10*3/uL (ref 4.0–10.5)

## 2016-11-27 LAB — BASIC METABOLIC PANEL
ANION GAP: 7 (ref 5–15)
ANION GAP: 7 (ref 5–15)
Anion gap: 8 (ref 5–15)
BUN: 15 mg/dL (ref 6–20)
BUN: 16 mg/dL (ref 6–20)
BUN: 17 mg/dL (ref 6–20)
CALCIUM: 8 mg/dL — AB (ref 8.9–10.3)
CHLORIDE: 106 mmol/L (ref 101–111)
CO2: 28 mmol/L (ref 22–32)
CO2: 26 mmol/L (ref 22–32)
CO2: 28 mmol/L (ref 22–32)
CREATININE: 0.82 mg/dL (ref 0.44–1.00)
Calcium: 8.1 mg/dL — ABNORMAL LOW (ref 8.9–10.3)
Calcium: 8.2 mg/dL — ABNORMAL LOW (ref 8.9–10.3)
Chloride: 103 mmol/L (ref 101–111)
Chloride: 104 mmol/L (ref 101–111)
Creatinine, Ser: 0.76 mg/dL (ref 0.44–1.00)
Creatinine, Ser: 0.77 mg/dL (ref 0.44–1.00)
GFR calc Af Amer: >60 mL/min (ref >60)
GLUCOSE: 109 mg/dL — AB (ref 65–99)
Glucose, Bld: 144 mg/dL — ABNORMAL HIGH (ref 65–99)
Glucose, Bld: 138 mg/dL — ABNORMAL HIGH (ref 65–99)
POTASSIUM: 2.9 mmol/L — AB (ref 3.5–5.1)
Potassium: 3.2 mmol/L — ABNORMAL LOW (ref 3.5–5.1)
Potassium: 3.1 mmol/L — ABNORMAL LOW (ref 3.5–5.1)
SODIUM: 141 mmol/L (ref 135–145)
SODIUM: 137 mmol/L (ref 135–145)
SODIUM: 139 mmol/L (ref 135–145)

## 2016-11-27 LAB — PREPARE FRESH FROZEN PLASMA
UNIT DIVISION: 0
UNIT DIVISION: 0
UNIT DIVISION: 0
Unit division: 0

## 2016-11-27 LAB — BPAM RBC
BLOOD PRODUCT EXPIRATION DATE: 201804042359
BLOOD PRODUCT EXPIRATION DATE: 201804062359
Blood Product Expiration Date: 201804092359
ISSUE DATE / TIME: 201803082231
ISSUE DATE / TIME: 201803101046
UNIT TYPE AND RH: 5100
UNIT TYPE AND RH: 5100
UNIT TYPE AND RH: 5100

## 2016-11-27 LAB — BPAM FFP
BLOOD PRODUCT EXPIRATION DATE: 201803152359
BLOOD PRODUCT EXPIRATION DATE: 201803152359
Blood Product Expiration Date: 201803142359
Blood Product Expiration Date: 201803142359
ISSUE DATE / TIME: 201803092005
ISSUE DATE / TIME: 201803092214
ISSUE DATE / TIME: 201803101622
ISSUE DATE / TIME: 201803102359
UNIT TYPE AND RH: 5100
UNIT TYPE AND RH: 5100
UNIT TYPE AND RH: 5100
Unit Type and Rh: 5100

## 2016-11-27 LAB — TYPE AND SCREEN
ABO/RH(D): O POS
Antibody Screen: NEGATIVE
DAT, IGG: NEGATIVE
UNIT DIVISION: 0
Unit division: 0
Unit division: 0

## 2016-11-27 LAB — AMMONIA: Ammonia: 147 umol/L — ABNORMAL HIGH (ref 9–35)

## 2016-11-27 LAB — PROTIME-INR
INR: 2.59
PROTHROMBIN TIME: 28.2 s — AB (ref 11.4–15.2)

## 2016-11-27 LAB — APTT: APTT: 58 s — AB (ref 24–36)

## 2016-11-27 LAB — MAGNESIUM: Magnesium: 1.6 mg/dL — ABNORMAL LOW (ref 1.7–2.4)

## 2016-11-27 LAB — PHOSPHORUS: Phosphorus: 2.8 mg/dL (ref 2.5–4.6)

## 2016-11-27 MED ORDER — POTASSIUM CHLORIDE 10 MEQ/100ML IV SOLN
10.0000 meq | INTRAVENOUS | Status: AC
  Administered 2016-11-27 (×3): 10 meq via INTRAVENOUS
  Filled 2016-11-27 (×3): qty 100

## 2016-11-27 MED ORDER — SODIUM CHLORIDE 0.9 % IV SOLN: 30.0000 meq | Freq: Once | INTRAVENOUS | Status: DC

## 2016-11-27 MED ORDER — VITAMIN K1 10 MG/ML IJ SOLN
10.0000 mg | Freq: Every day | INTRAMUSCULAR | Status: DC
  Administered 2016-11-27: 10 mg via SUBCUTANEOUS
  Filled 2016-11-27 (×2): qty 1

## 2016-11-27 MED ORDER — DEXMEDETOMIDINE HCL IN NACL 200 MCG/50ML IV SOLN
0.4000 ug/kg/h | INTRAVENOUS | Status: DC
  Administered 2016-11-27: 0.4 ug/kg/h via INTRAVENOUS
  Administered 2016-11-27: 1.2 ug/kg/h via INTRAVENOUS
  Administered 2016-11-28: 0.8 ug/kg/h via INTRAVENOUS
  Administered 2016-11-28: 0.5 ug/kg/h via INTRAVENOUS
  Administered 2016-11-28 (×2): 0.4 ug/kg/h via INTRAVENOUS
  Administered 2016-11-28: 1 ug/kg/h via INTRAVENOUS
  Administered 2016-11-29: 0.5 ug/kg/h via INTRAVENOUS
  Administered 2016-11-29: 0.6 ug/kg/h via INTRAVENOUS
  Administered 2016-11-29: 0.8 ug/kg/h via INTRAVENOUS
  Administered 2016-11-29: 0.4 ug/kg/h via INTRAVENOUS
  Administered 2016-11-30: 0.8 ug/kg/h via INTRAVENOUS
  Administered 2016-11-30: 0.5 ug/kg/h via INTRAVENOUS
  Administered 2016-11-30: 0.4 ug/kg/h via INTRAVENOUS
  Administered 2016-11-30: 0.8 ug/kg/h via INTRAVENOUS
  Filled 2016-11-27: qty 50
  Filled 2016-11-27: qty 100
  Filled 2016-11-27 (×2): qty 50
  Filled 2016-11-27 (×2): qty 100
  Filled 2016-11-27: qty 50
  Filled 2016-11-27: qty 100
  Filled 2016-11-27: qty 50
  Filled 2016-11-27 (×7): qty 100
  Filled 2016-11-27 (×2): qty 50

## 2016-11-27 MED ORDER — LACTULOSE ENEMA
300.0000 mL | Freq: Every day | ORAL | Status: AC
  Administered 2016-11-27 – 2016-11-28 (×2): 300 mL via RECTAL
  Filled 2016-11-27 (×2): qty 300

## 2016-11-27 MED ORDER — CHLORHEXIDINE GLUCONATE CLOTH 2 % EX PADS
6.0000 | MEDICATED_PAD | Freq: Every day | CUTANEOUS | Status: DC
  Administered 2016-11-28 – 2016-12-01 (×5): 6 via TOPICAL

## 2016-11-27 MED ORDER — FENTANYL CITRATE (PF) 100 MCG/2ML IJ SOLN
25.0000 ug | INTRAMUSCULAR | Status: DC | PRN
  Administered 2016-11-27 (×2): 50 ug via INTRAVENOUS
  Administered 2016-11-27 – 2016-11-28 (×3): 100 ug via INTRAVENOUS
  Administered 2016-11-28: 50 ug via INTRAVENOUS
  Administered 2016-11-28: 25 ug via INTRAVENOUS
  Administered 2016-11-28 – 2016-11-29 (×3): 100 ug via INTRAVENOUS
  Administered 2016-11-29: 50 ug via INTRAVENOUS
  Administered 2016-11-30: 100 ug via INTRAVENOUS
  Administered 2016-11-30: 50 ug via INTRAVENOUS
  Administered 2016-11-30: 25 ug via INTRAVENOUS
  Filled 2016-11-27 (×13): qty 2

## 2016-11-27 MED ORDER — POTASSIUM CHLORIDE 10 MEQ/100ML IV SOLN
10.0000 meq | INTRAVENOUS | Status: AC
  Administered 2016-11-27 (×6): 10 meq via INTRAVENOUS
  Filled 2016-11-27 (×6): qty 100

## 2016-11-27 MED ORDER — MAGNESIUM SULFATE 2 GM/50ML IV SOLN
2.0000 g | Freq: Once | INTRAVENOUS | Status: AC
  Administered 2016-11-27: 2 g via INTRAVENOUS
  Filled 2016-11-27: qty 50

## 2016-11-27 MED ORDER — POTASSIUM CHLORIDE 20 MEQ/15ML (10%) PO SOLN: 40.0000 meq | Freq: Three times a day (TID) | ORAL | Status: DC

## 2016-11-27 MED ORDER — FUROSEMIDE 10 MG/ML IJ SOLN: 40.0000 mg | Freq: Once | INTRAMUSCULAR | Status: DC

## 2016-11-27 NOTE — Assessment & Plan Note (Signed)
Hemorrhagic shock resolved.  Holding labetolol, coreg, lisinopril & lipitor  Cont lasix; add back zaroxolyn?  Cont tele  DNR if arrests KVO IVFs.

## 2016-11-27 NOTE — Progress Notes (Signed)
Alexandria [name] is still intubated. From my point of view, I feel that her pulmonary issues are a much bigger problem than the breast cancer. I feel very confident that we can help the breast cancer. Again it is confined to her liver. The tumor is ER positive. Because of this, we can utilize antiestrogen therapy.  I probably would go ahead and get started with this. I don't see any downside to this. I hope the hospital has Faslodex. This is a intramuscular injection. It might be a good time to do this while she is sedated. It is done once every month. We have to get a loading program going so she gets a dose every 2 weeks for 1 month.  Hopefully, she will be extubated today.  Her hemoglobin is going up. It is 8.6. Her creatinine is 0.76. Her platelet count is 146,000.  Her potassium is on the low side. I think she is getting some potassium.  She does have an high INR. I don't think this is from her malignancy in the liver. 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 I would make sure that she is on vitamin K.  As always, she is getting fantastic care down in the ICU.  [name], MD  Psalm 66:5

## 2016-11-27 NOTE — Progress Notes (Signed)
Date:  November 27, 2016 Chart reviewed for concurrent status and case management needs. Will continue to follow patient progress.  Remains intubated with full vent support at time of review. Discharge Planning: following for needs Expected discharge date: [id number] Velva Harman, BSN, [place], [place]

## 2016-11-27 NOTE — Assessment & Plan Note (Signed)
Hypokalemia & hypomagnesemia  Plan Replace and recheck as needed.

## 2016-11-27 NOTE — Progress Notes (Signed)
PULMONARY / CRITICAL CARE MEDICINE   Name: Alexandria [name] MRN: [id number] DOB: 1951-04-27    ADMISSION DATE:  11/23/2016 CONSULTATION DATE:  11/23/16  REFERRING MD:  Dr. Leonette Monarch / EDP   CHIEF COMPLAINT:  GIB, AMS  SUMMARY:  66 y/o F with new diagnosis of metastatic breast cancer to the liver admitted 3/8 with acute upper GIB in the setting of a large ulceration at the GEJ with active bleeding.    SUBJECTIVE: Sedated on vent.  Not opening eyes to stim but sedation gtt just stopped earlier this am   VITAL SIGNS: BP (!) 157/70   Pulse 77   Temp 98.3 F (36.8 C) (Axillary)   Resp 14   Ht 5\' 6"  (1.676 m)   Wt 251 lb 12.3 oz (114.2 kg)   LMP 01/30/2005   SpO2 97%   BMI 40.64 kg/m   HEMODYNAMICS: CVP:  [7 mmHg-15 mmHg] 10 mmHg  VENTILATOR SETTINGS: Vent Mode: PRVC FiO2 (%):  [30 %] 30 % Set Rate:  [18 bmp] 18 bmp Vt Set:  [500 mL] 500 mL PEEP:  [5 cmH20] 5 cmH20 Plateau Pressure:  [12 cmH20-19 cmH20] 19 cmH20  INTAKE / OUTPUT:  Intake/Output Summary (Last 24 hours) at 11/27/16 0943 Last data filed at 11/27/16 0800  Gross per 24 hour  Intake          1590.95 ml  Output             5120 ml  Net         -3529.05 ml     General appearance:  66 Year old  female, well nourished, sedated on vent  Eyes: anicteric sclerae, moist conjunctivae; PERRL, EOMI bilaterally. Mouth:  membranes and no mucosal ulcerations; normal hard and soft palate, orally intubated Neck: Trachea midline; neck supple, no JVD, right IJ unremarkable  Lungs/chest: CTA, with normal respiratory effort and no intercostal retractions; excellent F/vt on PSV of 5 cmH2O CV: RRR, no MRGs  Abdomen: Soft, non-tender; no masses or HSM Extremities:trace peripheral edema  Skin: Normal temperature, turgor and texture; dry scaley rash RLE, ulcers or subcutaneous nodules Psych: Appropriate affect, alert and oriented to person, place and time   LABS:  BMET  Recent Labs Lab 11/26/16 0438 11/26/16 1811  11/27/16 0423  NA 147* 142 141  K 3.3* 3.1* 2.9*  CL 116* 110 106  CO2 24 25 28   BUN 16 14 15   CREATININE 0.70 0.84 0.76  GLUCOSE 86 113* 109*    Electrolytes  Recent Labs Lab 11/24/16 0329  11/26/16 0438 11/26/16 1811 11/27/16 0423  CALCIUM 8.3*  < > 8.3* 8.1* 8.1*  MG 1.8  --   --   --  1.6*  PHOS 2.8  --   --   --  2.8  < > = values in this interval not displayed.  CBC  Recent Labs Lab 11/26/16 0438 11/26/16 1314 11/27/16 0423  WBC 4.0 4.9 5.7  HGB 7.7* 8.1* 8.6*  HCT 23.1* 23.6* 25.3*  PLT 109* 132* 146*    Coag's  Recent Labs Lab 11/24/16 1430 11/25/16 1018 11/27/16 0423  APTT  --  51* 58*  INR 1.74 2.10 2.59    Sepsis Markers  Recent Labs Lab 11/23/16 0953  LATICACIDVEN 2.73*    ABG  Recent Labs Lab 11/23/16 1515 11/24/16 0428  PHART 7.233* 7.394  PCO2ART 44.7 39.9  PO2ART 277* 80.9*    Liver Enzymes  Recent Labs Lab 11/23/16 0944  AST 154*  ALT 60*  ALKPHOS 244*  BILITOT 3.3*  ALBUMIN 2.4*    Cardiac Enzymes No results for input(s): TROPONINI, PROBNP in the last 168 hours.  Glucose  Recent Labs Lab 11/25/16 0955 11/26/16 0742  GLUCAP 112* 84    Imaging Dg Chest Port 1 View  Result Date: 11/27/2016 CLINICAL DATA:  Respiratory failure. EXAM: PORTABLE CHEST 1 VIEW COMPARISON:  11/26/2016 . FINDINGS: Endotracheal tube, right IJ line in stable position. Heart size stable. Bibasilar atelectasis again noted. Interim slight improvement. Small left pleural effusion. No pneumothorax. Basilar subsegmental atelectasis. Small left pleural effusion. No pneumothorax. IMPRESSION: 1. Lines and tubes in stable position. 2.  Bibasilar atelectasis again noted.  Interim slight improvement. Electronically Signed   By: Marcello Moores  Register   On: 11/27/2016 07:28   PCXR: personally reviewed. Basilar aeration has improved. Still w/ stable ATX  STUDIES:  EGD 3/8 >> large ulceration at GEJ with active  bleeding  CULTURES:   ANTIBIOTICS: Ceftriaxone 3/9 >> 3/11  SIGNIFICANT EVENTS: 3/08  Admit with GIB, hypotension/shock, endo- clip placed on bleed  LINES/TUBES: ETT 3/8 >>  R IJ TLC 3/8 >>    ASSESSMENT / PLAN:   Acute upper GI bleed In setting of gastric ulcer. Now s/p EGD X2. On 3/8 ulcer injected w/ epi and endo-clipped, f/u EGD 3/10 w/out evidence of active bleeding.  -Hgb holding overnight  Plan Keep NPO for now NGT was pulled per GI and we are not to replace it  Cont PPI infusion  Further recs per GI    Acute blood loss anemia, Coagulopathy & mild thrombocytopenia  ? Consumptive? Has h/o stage IV breast CA w/ mets to liver so coagulopathy could be coming from hepatic dysfxn vs simply consumptive process.  hgb holding over night.  INR back up to 2.5 Plan Trend daily CBC and PT/INR SCDs Vit K today (3/12)   Acute hypoxemic respiratory failure (HCC) in setting of hemorrhagic shock. LLL atelectasis. Pulmonary edema.  LLL consolidation w/out fever or purulent sputum. We d/c'd abx on 3/11 Plan SBT this am Lasix as BUN/creatinine allows  PCXR in am   HTN (hypertension), Chronic Diastolic HF, h/o HLD and CAD.  Hemorrhagic shock resolved.  Holding labetolol, coreg, lisinopril & lipitor  Cont lasix; Cont tele  DNR if arrests KVO IVFs.    Electrolyte imbalance Hypokalemia & hypomagnesemia  Plan Replace and recheck as needed.    Acute encephalopathy Plan RAS goal 0 to -1 fent PRN; dc gtt Ck ammonia  DVT prophylaxis: SCDs SUP: PPI  Diet: NPO Activity: bedrest w/ hob elevated and aspiration precautions  Disposition : keep in ICU  Discussion H&H holding.No evidence of bleeding now. Tolerating SBT. Still a little sleepy but stopped fent gtt this am. For today major goal is: SBT, cont PPI, and supportive care. All else as outlined above.  I have updated the son at the bedside as well as discussed the plan of care w/ her bedside RN. Hoping we can get  her off vent later this morning but her MS is the major barrier   My critical care time 36 minutes.   Erick Colace ACNP-BC [place] Pager # [phone] OR # [phone] if no answer

## 2016-11-27 NOTE — Assessment & Plan Note (Addendum)
Plan RAS goal 0 to -1 fent PRN Lactulose enemas

## 2016-11-27 NOTE — Progress Notes (Signed)
[place] Progress Note Patient Name: Alexandria [name] DOB: 1951/08/19 MRN: [id number]   Date of Service  11/27/2016  HPI/Events of Note    eICU Interventions  Hypokalemia, repleted Replace Mg as well     Intervention Category Intermediate Interventions: Electrolyte abnormality - evaluation and management  Simonne Maffucci 11/27/2016, 6:07 AM

## 2016-11-27 NOTE — Assessment & Plan Note (Addendum)
?   Consumptive? Has h/o stage IV breast CA w/ mets to liver so coagulopathy could be coming from hepatic dysfxn vs simply consumptive process.  hgb holding over night.  INR back up to 2.5 Plan Trend daily CBC and PT/INR SCDs Vit K today (3/12)

## 2016-11-27 NOTE — Assessment & Plan Note (Signed)
LLL consolidation w/out fever or purulent sputum. We d/c'd abx on 3/11 Plan SBT this am Lasix as BUN/creatinine allows  PCXR in am

## 2016-11-27 NOTE — Assessment & Plan Note (Addendum)
In setting of gastric ulcer. Now s/p EGD X2. On 3/8 ulcer injected w/ epi and endo-clipped, f/u EGD 3/10 w/out evidence of active bleeding.  -Hgb holding overnight  Plan Keep NPO for now NGT was pulled per GI and we are not to replace it  Cont PPI infusion  Further recs per GI

## 2016-11-27 NOTE — Assessment & Plan Note (Signed)
Suspect d/t metastatic disease Plan Trend LFTs

## 2016-11-27 NOTE — Progress Notes (Signed)
eLink Physician-Brief Progress Note Patient Name: Alexandria [name] DOB: 1950-10-17 MRN: [id number]   Date of Service  11/27/2016  HPI/Events of Note  K+ = 3.1 and Creatinine = 0.77.   eICU Interventions  Will replace K+.     Intervention Category Intermediate Interventions: Electrolyte abnormality - evaluation and management  [name] 11/27/2016, 8:12 PM

## 2016-11-28 ENCOUNTER — Inpatient Hospital Stay (HOSPITAL_COMMUNITY): Payer: Medicare Other

## 2016-11-28 LAB — COMPREHENSIVE METABOLIC PANEL
ALBUMIN: 2.5 g/dL — AB (ref 3.5–5.0)
ALK PHOS: 142 U/L — AB (ref 38–126)
ALT: 55 U/L — AB (ref 14–54)
AST: 165 U/L — AB (ref 15–41)
Anion gap: 8 (ref 5–15)
BUN: 17 mg/dL (ref 6–20)
CALCIUM: 8.2 mg/dL — AB (ref 8.9–10.3)
CO2: 27 mmol/L (ref 22–32)
Chloride: 105 mmol/L (ref 101–111)
Creatinine, Ser: 0.79 mg/dL (ref 0.44–1.00)
GFR calc non Af Amer: >60 mL/min (ref >60)
GLUCOSE: 99 mg/dL (ref 65–99)
Potassium: 3.2 mmol/L — ABNORMAL LOW (ref 3.5–5.1)
SODIUM: 140 mmol/L (ref 135–145)
Total Bilirubin: 3.2 mg/dL — ABNORMAL HIGH (ref 0.3–1.2)
Total Protein: 5.9 g/dL — ABNORMAL LOW (ref 6.5–8.1)

## 2016-11-28 LAB — MAGNESIUM: Magnesium: 1.8 mg/dL (ref 1.7–2.4)

## 2016-11-28 LAB — CBC
HCT: 26.4 % — ABNORMAL LOW (ref 36.0–46.0)
HEMOGLOBIN: 9 g/dL — AB (ref 12.0–15.0)
MCH: 27.6 pg (ref 26.0–34.0)
MCHC: 34.1 g/dL (ref 30.0–36.0)
MCV: 81 fL (ref 78.0–100.0)
Platelets: 154 10*3/uL (ref 150–400)
RBC: 3.26 MIL/uL — AB (ref 3.87–5.11)
RDW: 16.7 % — ABNORMAL HIGH (ref 11.5–15.5)
WBC: 6.6 10*3/uL (ref 4.0–10.5)

## 2016-11-28 LAB — AMMONIA: Ammonia: 99 umol/L — ABNORMAL HIGH (ref 9–35)

## 2016-11-28 LAB — TROPONIN I
Troponin I: 0.03 ng/mL (ref <0.03)
Troponin I: <0.03 ng/mL (ref <0.03)
Troponin I: <0.03 ng/mL (ref <0.03)

## 2016-11-28 MED ORDER — SODIUM CHLORIDE 0.9 % IV SOLN: 30.0000 meq | Freq: Once | INTRAVENOUS | Status: DC

## 2016-11-28 MED ORDER — VITAMIN K1 10 MG/ML IJ SOLN
10.0000 mg | Freq: Once | INTRAVENOUS | Status: AC
  Administered 2016-11-28: 10 mg via INTRAVENOUS
  Filled 2016-11-28: qty 1

## 2016-11-28 MED ORDER — FULVESTRANT 250 MG/5ML IM SOLN
500.0000 mg | Freq: Once | INTRAMUSCULAR | Status: AC
  Administered 2016-11-29: 500 mg via INTRAMUSCULAR
  Filled 2016-11-28 (×2): qty 10

## 2016-11-28 MED ORDER — POTASSIUM CHLORIDE 10 MEQ/100ML IV SOLN
10.0000 meq | INTRAVENOUS | Status: AC
  Administered 2016-11-28 (×3): 10 meq via INTRAVENOUS
  Filled 2016-11-28 (×3): qty 100

## 2016-11-28 NOTE — Progress Notes (Signed)
[place] Progress Note Patient Name: Alexandria [name] DOB: 07/03/1951 MRN: [id number]   Date of Service  11/28/2016  HPI/Events of Note    eICU Interventions  Hypokalemia, repleted Consider replacing OG if able for oral supplementation     Intervention Category Intermediate Interventions: Electrolyte abnormality - evaluation and management  Simonne Maffucci 11/28/2016, 5:39 AM

## 2016-11-28 NOTE — Progress Notes (Signed)
[name] Attending Note  Presenting HPI:  66 y.o. female with new diagnosis of metastatic breast cancer to the liver admitted 3/8 with acute upper GIB in the setting of a large ulceration at the [facility] with active bleeding.   Subjective:  Patient with low blood pressure overnight. Patient purposely reaching for to but not following commands.  Review of Systems:  Unable to obtain given intubation & sedation.   Vent Mode: PRVC FiO2 (%):  [30 %] 30 % Set Rate:  [18 bmp] 18 bmp Vt Set:  [500 mL] 500 mL PEEP:  [5 cmH20] 5 cmH20 Plateau Pressure:  [12 cmH20-20 cmH20] 16 cmH20  Temp:  [97.1 F (36.2 C)-98.7 F (37.1 C)] 97.1 F (36.2 C) (03/13 0310) Pulse Rate:  [56-101] 60 (03/13 0700) Resp:  [13-40] 18 (03/13 0700) BP: (78-157)/(37-84) 98/46 (03/13 0700) SpO2:  [97 %-100 %] 97 % (03/13 0700) FiO2 (%):  [30 %] 30 % (03/13 0337) Weight:  [251 lb 5.2 oz (114 kg)] 251 lb 5.2 oz (114 kg) (03/13 0154)  Gen.: No acute distress. Laying in bed. Eyes closed. No family at bedside. Integument: Warm and dry. No rash or bruising on exposed skin. HEENT: No scleral icterus or injection. Endotracheal tube in place. Moist mucous membranes. Neurological: Pupils symmetric. Spontaneously moving all 4 extremities. Not following commands. Pulmonary: Coarse breath sounds bilaterally. Symmetric chest wall rise on ventilator. Cardiac: Regular rate. Regular rhythm. No edema or JVD appreciated. Abdomen: Soft. Protuberant. Normal bowel sounds.  LINES/TUBES: OETT 3/8 >>> R IJ CVL 3/8 >>> Foley 3/8 >>>  CBC Latest Ref Rng & Units 11/28/2016 11/27/2016 11/26/2016  WBC 4.0 - 10.5 K/uL 6.6 5.7 4.9  Hemoglobin 12.0 - 15.0 g/dL 9.0(L) 8.6(L) 8.1(L)  Hematocrit 36.0 - 46.0 % 26.4(L) 25.3(L) 23.6(L)  Platelets 150 - 400 K/uL 154 146(L) 132(L)   BMP Latest Ref Rng & Units 11/28/2016 11/27/2016 11/27/2016  Glucose 65 - 99 mg/dL 99 138(H) 144(H)  BUN 6 - 20 mg/dL 17 17 16   Creatinine 0.44 - 1.00  mg/dL 0.79 0.77 0.82  Sodium 135 - 145 mmol/L 140 139 137  Potassium 3.5 - 5.1 mmol/L 3.2(L) 3.1(L) 3.2(L)  Chloride 101 - 111 mmol/L 105 104 103  CO2 22 - 32 mmol/L 27 28 26   Calcium 8.9 - 10.3 mg/dL 8.2(L) 8.2(L) 8.0(L)   Hepatic Function Latest Ref Rng & Units 11/28/2016 11/23/2016 11/08/2016  Total Protein 6.5 - 8.1 g/dL 5.9(L) 5.9(L) 6.6  Albumin 3.5 - 5.0 g/dL 2.5(L) 2.4(L) 2.7(L)  AST 15 - 41 U/L 165(H) 154(H) 136(H)  ALT 14 - 54 U/L 55(H) 60(H) 137(H)  Alk Phosphatase 38 - 126 U/L 142(H) 244(H) 205(H)  Total Bilirubin 0.3 - 1.2 mg/dL 3.2(H) 3.3(H) 1.7(H)    IMAGING/STUDIES: PORT CXR 3/12:  Previously reviewed by me. Right internal jugular central venous catheter and endotracheal tube in good position. Low lung volumes. CT HEAD W/O 3/12:  No acute abnormality.  Mild diffuse cerebral atrophy. PORT CXR 3/13:  Personally reviewed by me. Bilateral perihilar opacities unchanged compared with previous imaging. Right internal jugular central venous catheter and endotracheal tubes in good position.  MICROBIOLOGY: MRSA PCR 3/8:  Negative   ANTIBIOTICS: Rocephin 3/9 - 3/11  SIGNIFICANT EVENTS: 03/08 - Admit w/ GIB >>> EGD performed & clip placed @ large ulceration at GE junction w/ active bleeding 03/10 - Repeat EGD w/o active bleeding  ASSESSMENT/PLAN:  66 y.o. female admitted with acute upper GI bleed. Continued altered mental status of unclear  etiology. Continue electrolyte replacement issues.  1. Acute upper GI bleed: Status post EGD 2. Continuing Protonix IV every 12 hours. Holding on replacement of enteric feeding tube given prior clip placement. 2. Acute encephalopathy: Unclear etiology likely multifactorial. Continuing lactulose enemas daily. Continuing Precedex drip. 3. Acute hypoxic respiratory failure: Likely multifactorial. Holding on diuresis given borderline blood pressure. Mental status is main barrier to extubation. 4. Coagulopathy: Status post vitamin K yesterday [place]  & switching to IV x1 dose today. Continuing to trend coags daily. Likely secondary to liver cirrhosis with metastases. 5. Anemia: Secondary to acute blood loss. Hemoglobin stable. Continuing to trend cell counts daily with CBC. Plan to transfuse for hemoglobin less than 7.0 or active bleeding. 6. Essential hypertension: Currently patient is hypotensive. Holding on labetalol, Coreg, and lisinopril. 7. Hypotension: Possibly secondary to sedation. Continuing to monitor closely. Holding on further diuresis. Checking EKG & Troponin I q6hr x3.  8. Chronic diastolic congestive heart failure: Holding home Lasix given recent hypotension. 9. History of coronary artery disease/hyperlipidemia: Continuing to hold on Lipitor. Holding antiplatelet therapy given recent GI bleeding. 10. Toxic appearing: Mild. Platelet count stable. Trending cell counts daily with CBC. 11. Hypokalemia: KCl 10 mEq IV 3 doses. Repeat electrolytes in the morning. 12. Transaminitis:  Mild. Repeat LFTs in AM. 13. Hypomagnesemia:  Resolved. 14. Metastatic breast cancer: Not currently on inpatient treatment. 15. Liver cirrhosis: New diagnosis. Continuing daily lactulose enema.  Prophylaxis:  SCDs & Protonix IV q12hr.  Diet:  NPO. Holding on tube feedings pending possible extubation in the next 24-48 hours.  Code Status:  Limited Code per previous physician discussion.  Disposition:  Patient remains intubated & critically ill in the ICU.  Family Update:  No family at bedside during my rounds.   I have personally spent a total of 33 minutes of critical care time today caring for the patient & reviewing the aptient's electronic medical record.  Sonia Baller Ashok Cordia, M.D. [facility] Pulmonary & Critical Care Pager:  [phone] After 3pm or if no response, call [phone] 7:47 AM 11/28/16

## 2016-11-28 NOTE — Progress Notes (Signed)
[facility] ADULT ICU REPLACEMENT PROTOCOL FOR AM LAB REPLACEMENT ONLY  The patient does not apply for the [facility] Adult ICU Electrolyte Replacment Protocol based on the criteria listed below:   1. Is GFR >/= 40 ml/min? Yes.    Patient's GFR today is >60 2. Is urine output >/= 0.5 ml/kg/hr for the last 6 hours? No. Patient's UOP is 0.36 ml/kg/hr 3. Is BUN < 60 mg/dL? Yes.    Patient's BUN today is 17 4. Abnormal electrolyte(s): K+3.2 5. Ordered repletion with: NA 6. If a panic level lab has been reported, has the CCM MD in charge been notified? No..   Physician:  Andree Coss Hilliard 11/28/2016 5:27 AM

## 2016-11-28 NOTE — Progress Notes (Signed)
Nutrition Follow-up  DOCUMENTATION CODES:   Morbid obesity  INTERVENTION:  - If pt unable to be d/c as hoped, recommend NGT or OGT placement with Vital High Protein @ 20 mL/hr to advance by 10 mL every 8 hours to reach Vital High Protein @ 60 mL/hr. At goal rate, this regimen will provide 1440 kcal, 126 grams of protein, and 1204 mL free water. - If pt unable to d/c and TF warranted, monitor magnesium, potassium, and phosphorus daily for at least 3 days, MD to replete as needed, as pt is at risk for refeeding syndrome given poor intakes PTA, no nutrition since admission 5 days ago, and current hypokalemia.   NUTRITION DIAGNOSIS:   Inadequate oral intake related to inability to eat as evidenced by NPO status. 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  The patient reported the bleeding began on 3/7, was bright red and was associated with abdominal pain.  She was recently admitted from 2/16-2/24 for Nissen fundoplication in setting of hiatal hernia.  During the surgery, her liver was noted to have an abnormal appearance prompting biopsy which was positive for metastatic breast cancer. The patient was scheduled to have a PET scan 2/28 which showed cirrhotic / hypermetabolic liver consistent with metastatic disease.  She was unable to make her ONC follow up.  3/13 Pt remains intubated and no NGT or OGT in place at this time. No family/visitors present. Pt with breast cancer with liver mets and Dr. Ernestina Penna note 3/9 PM states no plan for treatment at this time. Repeat upper EGD done 3/10 showing large esophageal ulceration with no active bleeding and a medium-sized hiatal hernia.   Per PCCM notes, plan is to continue to hold off on TF at this time with hope of extubation in  24-48 hours; confirmed this with Dr. Ashok Cordia who reports plan for extubation as long as agitation is reduced/controlled. Noted that ammonia is trending down but remains elevated. Weight today is -1 kg from previous assessment. TF recommendations outlined above should pt be unable to be extubated.   Patient is currently intubated on ventilator support MV: 9 L/min Temp (24hrs), Avg:97.9 F (36.6 C), Min:97.1 F (36.2 C), Max:98.6 F (37 C) BP: 111/64 and MAP: 79   Medications reviewed; 300 mL rectal lactulose/day, 40 mg IV Protonix BID, 10 mg IV vitamin K x1 dose today, 10 mEq IV KCl x9 runs yesterday and x3 runs today. Labs reviewed; K: 3.2 mmol/L, Ca: 8.2 mmol/L, LFTs elevated, ammonia: 99 umol/L (down from 147 yesterday).  IVF: D5 @ 30 mL/hr (122 kcal) Drip: Precedex @ 0.8 mcg/kg/hr.    3/9 - Spoke with pt's son, who is at bedside.  - He reports that PTA pt was not eating well since Nissen surgery, mainly consuming chocolate milk and ice cream.  - Son states that weight has been stable but that pt also has been retaining fluid and on Lasix; he is unable to specify time frame for this.  - Son is visibly anxious about his mom's current condition.  - Unable to obtain much nutrition-related information during this visit and mainly provided active listening during visit.  - Physical assessment shows no muscle or fat wasting, mild edema throughout.  - Per chart review, pt has  gained 10 lbs over the past 1 month which is likely fluid-related given pt's report of pt eating very poorly.   Patient is currently intubated on ventilator support. OGT in place to suction with blood in tubing and scant (<25cc) in canister at this time. MV: 9 L/min Temp (24hrs), Avg:98.1 F (36.7 C), Min:97.6 F (36.4 C), Max:98.8 F (37.1 C)  Propofol: none BP: 111/53 and MAP: 67 IVF: NS @ 125 mL/hr.  Drip: Fentanyl @ 75 mcg/hr.   Diet Order:  Diet NPO time specified  Skin:  Reviewed, no issues  Last  BM:  3/12  Height:   Ht Readings from Last 1 Encounters:  11/28/16 5\' 6"  (1.676 m)    Weight:   Wt Readings from Last 1 Encounters:  11/28/16 251 lb 5.2 oz (114 kg)    Ideal Body Weight:  59.09 kg  BMI:  Body mass index is 40.56 kg/m.  Estimated Nutritional Needs:   Kcal:  7903-8333 (22-25 kcal/kg IBW)  Protein:  118 grams (2 grams/kg IBW)  Fluid:  >/= 1.2 L/day  EDUCATION NEEDS:   No education needs identified at this time    Jarome Matin, MS, RD, LDN, CNSC Inpatient Clinical Dietitian Pager # [phone] After hours/weekend pager # [phone]

## 2016-11-28 NOTE — Progress Notes (Signed)
CRITICAL VALUE ALERT  Critical value received:  Troponin 0.03  Date of notification:  11/28/16  Time of notification:  0930  Critical value read back:Yes.    Nurse who received alert:  Ricki Rodriguez  MD notified (1st page):  Ashok Cordia  Time of first page:  Spoke with immediately-is outside room

## 2016-11-29 LAB — COMPREHENSIVE METABOLIC PANEL
ALBUMIN: 2.3 g/dL — AB (ref 3.5–5.0)
ALT: 56 U/L — ABNORMAL HIGH (ref 14–54)
ANION GAP: 8 (ref 5–15)
AST: 155 U/L — ABNORMAL HIGH (ref 15–41)
Alkaline Phosphatase: 133 U/L — ABNORMAL HIGH (ref 38–126)
BUN: 16 mg/dL (ref 6–20)
CO2: 24 mmol/L (ref 22–32)
Calcium: 8 mg/dL — ABNORMAL LOW (ref 8.9–10.3)
Chloride: 103 mmol/L (ref 101–111)
Creatinine, Ser: 0.75 mg/dL (ref 0.44–1.00)
GFR calc Af Amer: >60 mL/min (ref >60)
GFR calc non Af Amer: >60 mL/min (ref >60)
GLUCOSE: 118 mg/dL — AB (ref 65–99)
POTASSIUM: 3.1 mmol/L — AB (ref 3.5–5.1)
SODIUM: 135 mmol/L (ref 135–145)
TOTAL PROTEIN: 6 g/dL — AB (ref 6.5–8.1)
Total Bilirubin: 3.8 mg/dL — ABNORMAL HIGH (ref 0.3–1.2)

## 2016-11-29 LAB — MAGNESIUM: Magnesium: 1.9 mg/dL (ref 1.7–2.4)

## 2016-11-29 LAB — CBC WITH DIFFERENTIAL/PLATELET
Basophils Absolute: 0 10*3/uL (ref 0.0–0.1)
Basophils Relative: 0 %
Eosinophils Absolute: 0.1 10*3/uL (ref 0.0–0.7)
Eosinophils Relative: 3 %
HCT: 27 % — ABNORMAL LOW (ref 36.0–46.0)
HEMOGLOBIN: 9 g/dL — AB (ref 12.0–15.0)
LYMPHS ABS: 1.1 10*3/uL (ref 0.7–4.0)
Lymphocytes Relative: 23 %
MCH: 26.8 pg (ref 26.0–34.0)
MCHC: 33.3 g/dL (ref 30.0–36.0)
MCV: 80.4 fL (ref 78.0–100.0)
MONO ABS: 0.8 10*3/uL (ref 0.1–1.0)
Monocytes Relative: 16 %
NEUTROS PCT: 58 %
Neutro Abs: 2.8 10*3/uL (ref 1.7–7.7)
Platelets: 137 10*3/uL — ABNORMAL LOW (ref 150–400)
RBC: 3.36 MIL/uL — ABNORMAL LOW (ref 3.87–5.11)
RDW: 16.7 % — ABNORMAL HIGH (ref 11.5–15.5)
WBC: 4.8 10*3/uL (ref 4.0–10.5)

## 2016-11-29 LAB — CBC
HCT: 28.5 % — ABNORMAL LOW (ref 36.0–46.0)
HEMOGLOBIN: 9.4 g/dL — AB (ref 12.0–15.0)
MCH: 26.4 pg (ref 26.0–34.0)
MCHC: 33 g/dL (ref 30.0–36.0)
MCV: 80.1 fL (ref 78.0–100.0)
PLATELETS: 152 10*3/uL (ref 150–400)
RBC: 3.56 MIL/uL — ABNORMAL LOW (ref 3.87–5.11)
RDW: 16.6 % — AB (ref 11.5–15.5)
WBC: 6.1 10*3/uL (ref 4.0–10.5)

## 2016-11-29 LAB — PROTIME-INR
INR: 1.25
Prothrombin Time: 15.7 seconds — ABNORMAL HIGH (ref 11.4–15.2)

## 2016-11-29 LAB — APTT: APTT: 42 s — AB (ref 24–36)

## 2016-11-29 LAB — PHOSPHORUS: Phosphorus: 2.9 mg/dL (ref 2.5–4.6)

## 2016-11-29 MED ORDER — SODIUM CHLORIDE 0.9 % IV SOLN: 30.0000 meq | Freq: Once | INTRAVENOUS | Status: DC

## 2016-11-29 MED ORDER — HALOPERIDOL LACTATE 5 MG/ML IJ SOLN
2.0000 mg | Freq: Four times a day (QID) | INTRAMUSCULAR | Status: DC | PRN
  Administered 2016-11-29: 2 mg via INTRAVENOUS
  Filled 2016-11-29: qty 1

## 2016-11-29 MED ORDER — POTASSIUM CHLORIDE 10 MEQ/100ML IV SOLN
10.0000 meq | INTRAVENOUS | Status: AC
  Administered 2016-11-29 (×3): 10 meq via INTRAVENOUS
  Filled 2016-11-29 (×3): qty 100

## 2016-11-29 MED ORDER — LACTULOSE ENEMA
300.0000 mL | Freq: Every day | ORAL | Status: DC
  Administered 2016-11-29: 300 mL via RECTAL
  Filled 2016-11-29 (×2): qty 300

## 2016-11-29 NOTE — Progress Notes (Signed)
[place] Pulmonary & Critical Care Attending Note  Presenting HPI:  66 y.o. female with new diagnosis of metastatic breast cancer to the liver admitted 3/8 with acute upper GIB in the setting of a large ulceration at the [facility] with active bleeding.   Subjective:  No acute events overnight. Patient more awake and following commands, although only somewhat intermittently.   Review of Systems:  Unable to obtain given intubation.   Vent Mode: PRVC FiO2 (%):  [30 %] 30 % Set Rate:  [18 bmp] 18 bmp Vt Set:  [500 mL] 500 mL PEEP:  [5 cmH20] 5 cmH20 Plateau Pressure:  [16 cmH20-20 cmH20] 20 cmH20  Temp:  [97.8 F (36.6 C)-98.3 F (36.8 C)] 97.8 F (36.6 C) (03/14 0800) Pulse Rate:  [48-59] 55 (03/14 0800) Resp:  [16-20] 20 (03/14 0800) BP: (108-152)/(56-112) 128/68 (03/14 0800) SpO2:  [95 %-100 %] 99 % (03/14 0800) FiO2 (%):  [30 %] 30 % (03/14 0800) Weight:  [248 lb 3.8 oz (112.6 kg)] 248 lb 3.8 oz (112.6 kg) (03/14 0300)  Gen.: Patient laying in bed. No family at bedside. Respiratory therapist and nurse at bedside. Integument: No rash or bruising on exposed skin. Warm and dry.  Neurological: Patient spontaneously moving all 4 extremities. Not following commands. Does seem to attend to voice. Cardiovascular: Regular rate. No appreciable edema or JVD. Pulmonary: Slightly diminished in the bases. Otherwise good aeration bilaterally & symmetric chest wall rise on ventilator. Abdomen: Soft. Normal bowel sounds. Protuberant.  LINES/TUBES: OETT 3/8 >>> R IJ CVL 3/8 >>> Foley 3/8 >>>  CBC Latest Ref Rng & Units 11/29/2016 11/28/2016 11/27/2016  WBC 4.0 - 10.5 K/uL 4.8 6.6 5.7  Hemoglobin 12.0 - 15.0 g/dL 9.0(L) 9.0(L) 8.6(L)  Hematocrit 36.0 - 46.0 % 27.0(L) 26.4(L) 25.3(L)  Platelets 150 - 400 K/uL 137(L) 154 146(L)   BMP Latest Ref Rng & Units 11/29/2016 11/28/2016 11/27/2016  Glucose 65 - 99 mg/dL 118(H) 99 138(H)  BUN 6 - 20 mg/dL 16 17 17   Creatinine 0.44 - 1.00 mg/dL 0.75 0.79 0.77   Sodium 135 - 145 mmol/L 135 140 139  Potassium 3.5 - 5.1 mmol/L 3.1(L) 3.2(L) 3.1(L)  Chloride 101 - 111 mmol/L 103 105 104  CO2 22 - 32 mmol/L 24 27 28   Calcium 8.9 - 10.3 mg/dL 8.0(L) 8.2(L) 8.2(L)   Hepatic Function Latest Ref Rng & Units 11/29/2016 11/28/2016 11/23/2016  Total Protein 6.5 - 8.1 g/dL 6.0(L) 5.9(L) 5.9(L)  Albumin 3.5 - 5.0 g/dL 2.3(L) 2.5(L) 2.4(L)  AST 15 - 41 U/L 155(H) 165(H) 154(H)  ALT 14 - 54 U/L 56(H) 55(H) 60(H)  Alk Phosphatase 38 - 126 U/L 133(H) 142(H) 244(H)  Total Bilirubin 0.3 - 1.2 mg/dL 3.8(H) 3.2(H) 3.3(H)    IMAGING/STUDIES: PORT CXR 3/12:  Previously reviewed by me. Right internal jugular central venous catheter and endotracheal tube in good position. Low lung volumes. CT HEAD W/O 3/12:  No acute abnormality.  Mild diffuse cerebral atrophy. PORT CXR 3/13:  Previously reviewed by me. Bilateral perihilar opacities unchanged compared with previous imaging. Right internal jugular central venous catheter and endotracheal tubes in good position.  MICROBIOLOGY: MRSA PCR 3/8:  Negative   ANTIBIOTICS: Rocephin 3/9 - 3/11  SIGNIFICANT EVENTS: 03/08 - Admit w/ GIB >>> EGD performed & clip placed @ large ulceration at GE junction w/ active bleeding 03/10 - Repeat EGD w/o active bleeding  ASSESSMENT/PLAN:  66 y.o. female admitted with acute upper GI bleed. Altered mental status progressively improving with lactulose enemas. Patient  remains without nutrition. Hemoglobin remained stable without further signs of active bleeding either.   1. Acute upper GI bleed: Status post EGD 2. No further signs of bleeding. Continuing Protonix IV every 12 hours. 2. Acute encephalopathy: Progressively improving. Multifactorial but suspect possible component of hepatic encephalopathy. Continuing Precedex infusion & daily lactulose enemas. 3. Acute hypoxic respiratory failure: Improved. Likely multifactorial. Holding on extubation pending continued improvement in mental  status. 4. Coagulopathy: Resolved. Status post IV vitamin K. Trending coags daily. 5. Anemia: Secondary to acute blood loss. Hemoglobin remained stable without signs of active bleeding. Continuing to trend cell counts daily with CBC. Plan to transfuse for hemoglobin less than 7.0 or active bleeding. 6. Thrombocytopenia: Platelet count stable. Likely multifactorial from consumption as well as underlying liver cirrhosis. 7. Essential hypertension: Currently normotensive. Holding home labetalol, Coreg, and lisinopril. 8. Hypotension: Resolved. No evidence of ischemia. Holding on further diuresis. 9. Chronic diastolic congestive heart failure: Continuing to hold Lasix given recent hypotension. 10. History of coronary artery disease/hyperlipidemia: Continuing to hold Lipitor. Holding antiplatelet therapy given recent GI bleeding. 11. Hypokalemia: Replaced this morning. Plan to repeat electrolytes in the morning. 12. Transaminitis: Mild. Multifactorial with liver cirrhosis and hepatic metastases. Trending LFTs intermittently. 13. Hypomagnesemia: Resolved. 14. Metastatic breast cancer: Not currently on inpatient treatment. Follows with medical oncology. 15. Liver cirrhosis: New diagnosis. Continuing daily lactulose enemas.  Prophylaxis:  SCDs & Protonix IV q12hr.  Diet:  NPO. Holding on tube feedings pending possible extubation in the next 24 hours.  Code Status:  Limited Code per previous physician discussion.  Disposition:  Patient remains intubated & critically ill in the ICU.  Family Update:  No family at bedside again during rounds today.  I have spent a total of 31 minutes of critical care time today caring for the patient, reviewing plan of care with the patient's nurse, and reviewing the patient's electronic medical record.   Sonia Baller Ashok Cordia, M.D. [facility] Pulmonary & Critical Care Pager:  [phone] After 3pm or if no response, call [phone] 8:41 AM 11/29/16

## 2016-11-29 NOTE — Progress Notes (Signed)
Pt rectal tube out with balloon still inflated.  Stool is mostly liquid red and several large hard pieces that are brown.  Blood is leaking from rectum.  MD made aware.

## 2016-11-29 NOTE — Progress Notes (Signed)
[facility] ADULT ICU REPLACEMENT PROTOCOL FOR AM LAB REPLACEMENT ONLY  The patient does apply for the [facility] Adult ICU Electrolyte Replacment Protocol based on the criteria listed below:   1. Is GFR >/= 40 ml/min? Yes.    Patient's GFR today is >60 2. Is urine output >/= 0.5 ml/kg/hr for the last 6 hours? Yes.   Patient's UOP is 0.7 ml/kg/hr 3. Is BUN < 60 mg/dL? Yes.    Patient's BUN today is 16 4. Abnormal electrolyte(s): K+3.1 5. Ordered repletion with: Protocol 6. If a panic level lab has been reported, has the CCM MD in charge been notified? No..   Physician:  Cletus Gash Hilliard 11/29/2016 5:20 AM

## 2016-11-30 ENCOUNTER — Inpatient Hospital Stay (HOSPITAL_COMMUNITY): Payer: Medicare Other

## 2016-11-30 LAB — CBC WITH DIFFERENTIAL/PLATELET
BASOS ABS: 0 10*3/uL (ref 0.0–0.1)
BASOS PCT: 0 %
BASOS PCT: 0 %
Basophils Absolute: 0 10*3/uL (ref 0.0–0.1)
Eosinophils Absolute: 0.1 10*3/uL (ref 0.0–0.7)
Eosinophils Absolute: 0.3 10*3/uL (ref 0.0–0.7)
Eosinophils Relative: 2 %
Eosinophils Relative: 3 %
HEMATOCRIT: 26.2 % — AB (ref 36.0–46.0)
HEMATOCRIT: 31 % — AB (ref 36.0–46.0)
HEMOGLOBIN: 9.1 g/dL — AB (ref 12.0–15.0)
HEMOGLOBIN: 10.7 g/dL — AB (ref 12.0–15.0)
Lymphocytes Relative: 20 %
Lymphocytes Relative: 21 %
Lymphs Abs: 1 10*3/uL (ref 0.7–4.0)
Lymphs Abs: 2.2 10*3/uL (ref 0.7–4.0)
MCH: 27.7 pg (ref 26.0–34.0)
MCH: 27.2 pg (ref 26.0–34.0)
MCHC: 34.7 g/dL (ref 30.0–36.0)
MCHC: 34.5 g/dL (ref 30.0–36.0)
MCV: 79.6 fL (ref 78.0–100.0)
MCV: 78.7 fL (ref 78.0–100.0)
MONO ABS: 0.8 10*3/uL (ref 0.1–1.0)
Monocytes Absolute: 1.7 10*3/uL — ABNORMAL HIGH (ref 0.1–1.0)
Monocytes Relative: 16 %
Monocytes Relative: 16 %
NEUTROS ABS: 3.1 10*3/uL (ref 1.7–7.7)
Neutro Abs: 6.6 10*3/uL (ref 1.7–7.7)
Neutrophils Relative %: 62 %
Neutrophils Relative %: 61 %
Platelets: 125 10*3/uL — ABNORMAL LOW (ref 150–400)
Platelets: 233 10*3/uL (ref 150–400)
RBC: 3.29 MIL/uL — ABNORMAL LOW (ref 3.87–5.11)
RBC: 3.94 MIL/uL (ref 3.87–5.11)
RDW: 16.7 % — ABNORMAL HIGH (ref 11.5–15.5)
RDW: 16.7 % — AB (ref 11.5–15.5)
WBC: 5.1 10*3/uL (ref 4.0–10.5)
WBC: 10.8 10*3/uL — ABNORMAL HIGH (ref 4.0–10.5)

## 2016-11-30 LAB — AMYLASE: AMYLASE: 52 U/L (ref 28–100)

## 2016-11-30 LAB — LIPASE, BLOOD: Lipase: 46 U/L (ref 11–51)

## 2016-11-30 LAB — MAGNESIUM: MAGNESIUM: 1.9 mg/dL (ref 1.7–2.4)

## 2016-11-30 LAB — RENAL FUNCTION PANEL
ALBUMIN: 2.2 g/dL — AB (ref 3.5–5.0)
ANION GAP: 8 (ref 5–15)
BUN: 15 mg/dL (ref 6–20)
CALCIUM: 8 mg/dL — AB (ref 8.9–10.3)
CO2: 23 mmol/L (ref 22–32)
Chloride: 107 mmol/L (ref 101–111)
Creatinine, Ser: 0.75 mg/dL (ref 0.44–1.00)
GFR calc Af Amer: >60 mL/min (ref >60)
GFR calc non Af Amer: >60 mL/min (ref >60)
GLUCOSE: 123 mg/dL — AB (ref 65–99)
PHOSPHORUS: 3 mg/dL (ref 2.5–4.6)
Potassium: 3.2 mmol/L — ABNORMAL LOW (ref 3.5–5.1)
SODIUM: 138 mmol/L (ref 135–145)

## 2016-11-30 LAB — PROTIME-INR
INR: 1.33
PROTHROMBIN TIME: 16.5 s — AB (ref 11.4–15.2)

## 2016-11-30 LAB — APTT: APTT: 43 s — AB (ref 24–36)

## 2016-11-30 MED ORDER — OXYCODONE HCL 5 MG PO TABS
5.0000 mg | ORAL_TABLET | ORAL | Status: DC | PRN
  Administered 2016-12-01 – 2016-12-11 (×28): 5 mg via ORAL
  Filled 2016-11-30 (×28): qty 1

## 2016-11-30 MED ORDER — POTASSIUM CHLORIDE 10 MEQ/100ML IV SOLN
10.0000 meq | INTRAVENOUS | Status: AC
  Administered 2016-11-30 (×3): 10 meq via INTRAVENOUS
  Filled 2016-11-30 (×3): qty 100

## 2016-11-30 MED ORDER — LACTATED RINGERS IV BOLUS (SEPSIS)
1000.0000 mL | Freq: Once | INTRAVENOUS | Status: AC
  Administered 2016-11-30: 1000 mL via INTRAVENOUS

## 2016-11-30 MED ORDER — SODIUM CHLORIDE 0.9 % IV SOLN: 30.0000 meq | Freq: Once | INTRAVENOUS | Status: DC

## 2016-11-30 MED ORDER — LIP MEDEX EX OINT
TOPICAL_OINTMENT | CUTANEOUS | Status: DC | PRN
  Administered 2016-11-30: 10:00:00 via TOPICAL
  Filled 2016-11-30: qty 7

## 2016-11-30 NOTE — Procedures (Signed)
Extubation Procedure Note  Patient Details:   Name: ANACAROLINA EVELYN DOB: 1951-03-12 MRN: [id number]   Airway Documentation:     Evaluation  O2 sats: stable throughout Complications: No apparent complications Patient did tolerate procedure well. Bilateral Breath Sounds: Diminished   Yes   PT had positive cuff leak pre extubation. Placed PT on 3 lpm [place] post intubation (no usage at home per PT)- RN aware.  Baldwin Jamaica Nannette 11/30/2016, 8:16 AM

## 2016-11-30 NOTE — Progress Notes (Signed)
eLink Physician-Brief Progress Note Patient Name: Alexandria [name] DOB: 1950-11-06 MRN: [id number]   Date of Service  11/30/2016  HPI/Events of Note  Multiple issues: 1. Abdominal Pain and 2. Sinus Tachycardia - HR = 123.   eICU Interventions  Will order: 1. Portable Abdomen Film now. 2. CBC with diff now. 3. Amylase and Lipase now.  3. LR 1 liter IV over 1 hour now.      Intervention Category Intermediate Interventions: Abdominal pain - evaluation and management  [name] 11/30/2016, 9:34 PM

## 2016-11-30 NOTE — Progress Notes (Signed)
[facility] Attending Note  Presenting HPI:  66 y.o. female with new diagnosis of metastatic breast cancer to the liver admitted 3/8 with acute upper GIB in the setting of a large ulceration at the [facility] with active bleeding.   Subjective:  Patient noted to have bright red stool after expelling her rectal tube. She did have additional smear with some blood afterward. Patient signifies no pain or difficulty breathing.  Review of Systems:  Unable to obtain given intubation.   Vent Mode: PRVC FiO2 (%):  [30 %] 30 % Set Rate:  [18 bmp] 18 bmp Vt Set:  [500 mL] 500 mL PEEP:  [5 cmH20] 5 cmH20 Pressure Support:  [5 cmH20-10 cmH20] 10 cmH20 Plateau Pressure:  [16 cmH20-22 cmH20] 17 cmH20  Temp:  [97.3 F (36.3 C)-98.8 F (37.1 C)] 97.3 F (36.3 C) (03/15 0400) Pulse Rate:  [52-102] 57 (03/15 0700) Resp:  [16-30] 18 (03/15 0700) BP: (88-157)/(45-129) 96/54 (03/15 0700) SpO2:  [97 %-100 %] 100 % (03/15 0700) FiO2 (%):  [30 %] 30 % (03/15 0309) Weight:  [251 lb 8.7 oz (114.1 kg)] 251 lb 8.7 oz (114.1 kg) (03/15 0258)  Gen.: Patient lying in bed. No acute distress. Eyes open. No family at bedside. Integument: No rash or bruising on exposed skin. Skin is warm and dry. Neurological: Moving all 4 extremities equally. Following commands in all 4 extremities. Nods to questions. Pulmonary: Slightly diminished in the bases but otherwise clear with auscultation. No accessory muscle use on ventilator support. Cardiovascular: Regular rate. No edema. Unable to appreciate JVD. Abdomen: Soft. Protuberant. Normal bowel sounds. Nontender.  LINES/TUBES: OETT 3/8 >>> R IJ CVL 3/8 >>> Foley 3/8 >>>  CBC Latest Ref Rng & Units 11/30/2016 11/29/2016 11/29/2016  WBC 4.0 - 10.5 K/uL 5.1 6.1 4.8  Hemoglobin 12.0 - 15.0 g/dL 9.1(L) 9.4(L) 9.0(L)  Hematocrit 36.0 - 46.0 % 26.2(L) 28.5(L) 27.0(L)  Platelets 150 - 400 K/uL 125(L) 152 137(L)   BMP Latest Ref Rng & Units 11/30/2016 11/29/2016  11/28/2016  Glucose 65 - 99 mg/dL 123(H) 118(H) 99  BUN 6 - 20 mg/dL 15 16 17   Creatinine 0.44 - 1.00 mg/dL 0.75 0.75 0.79  Sodium 135 - 145 mmol/L 138 135 140  Potassium 3.5 - 5.1 mmol/L 3.2(L) 3.1(L) 3.2(L)  Chloride 101 - 111 mmol/L 107 103 105  CO2 22 - 32 mmol/L 23 24 27   Calcium 8.9 - 10.3 mg/dL 8.0(L) 8.0(L) 8.2(L)   Hepatic Function Latest Ref Rng & Units 11/30/2016 11/29/2016 11/28/2016  Total Protein 6.5 - 8.1 g/dL - 6.0(L) 5.9(L)  Albumin 3.5 - 5.0 g/dL 2.2(L) 2.3(L) 2.5(L)  AST 15 - 41 U/L - 155(H) 165(H)  ALT 14 - 54 U/L - 56(H) 55(H)  Alk Phosphatase 38 - 126 U/L - 133(H) 142(H)  Total Bilirubin 0.3 - 1.2 mg/dL - 3.8(H) 3.2(H)    IMAGING/STUDIES: PORT CXR 3/12:  Previously reviewed by me. Right internal jugular central venous catheter and endotracheal tube in good position. Low lung volumes. CT HEAD W/O 3/12:  No acute abnormality.  Mild diffuse cerebral atrophy. PORT CXR 3/13:  Previously reviewed by me. Bilateral perihilar opacities unchanged compared with previous imaging. Right internal jugular central venous catheter and endotracheal tubes in good position.  MICROBIOLOGY: MRSA PCR 3/8:  Negative   ANTIBIOTICS: Rocephin 3/9 - 3/11  SIGNIFICANT EVENTS: 03/08 - Admit w/ GIB >>> EGD performed & clip placed @ large ulceration at GE junction w/ active bleeding 03/10 - Repeat EGD w/o  active bleeding  ASSESSMENT/PLAN:  66 y.o. female admitted with acute upper GI bleed. Altered mental status seems to have largely resolved and she is following commands today. If patient tolerates spontaneous breathing trial would be reasonable to attempt extubation. Suspect bloody bowel movements overnight was due to mucosal irritation from inflation balloon from rectal tube.  1. Acute encephalopathy: Resolving. Continuing Precedex infusion. Holding on further lactulose enema. 2. Acute hypoxic respiratory failure: Stable. Holding on further diuresis given previous hypotension. Perform  spontaneous breathing trial now with goal of extubation. 3. Acute upper GI bleed: Status post clipping and EGD 2. Suspect bleeding overnight due to mucosal irritation from balloon of rectal tube. Hemoglobin stable. Continuing Protonix IV every 12 hours. 4. Coagulopathy: Resolved. Status post IV vitamin K. Continuing to trend coags daily. 5. Anemia: Secondary to acute blood loss. Hemoglobin remained stable. Trending cell counts daily. Plan to transfuse for hemoglobin less than 7.0 or active bleeding. 6. From; mild. Platelet count stable. Trending cell counts daily with CBC. 7. Essential hypertension: Currently normotensive. Monitoring vitals per protocol. Holding home labetalol, Coreg, and lisinopril. 8. Hypertension: Resolved. Likely multifactorial from diuresis. Holding on further diuresis. No evidence of ischemia. 9. Chronic diastolic congestive heart failure: Holding Lasix and beta blocker given recent hypotension. 10. History of coronary artery diseaseAc/hyperlipidemia: Holding Lipitor. Holding antiplatelet agents given recent GI bleeding. 11. Hypokalemia: KCl 30 mEq IV 1 this morning. Repeat electrolytes in the morning. 12. Transaminitis: Mild. 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 Trending LFTs and her medically. 13. Hypomagnesemia: Resolved. 14. Metastatic breast cancer: Currently not on treatment. Follows with medical oncology. 15. Liver cirrhosis: New diagnosis. Holding on further lactulose enema.   Prophylaxis:  SCDs & Protonix IV q12hr.  Diet:  NPO pending extubation. May be able to advance to clear liquid diet later today.  Code Status:  Limited Code per previous physician discussion.  Disposition:  Patient remains intubated & critically ill in the ICU.  Family Update:  No family at bedside during rounds today.   I have spent a total of 33 minutes of critical care time today caring for the patient, discussing plan of care with nurse at bedside, and reviewing the  patient's electronic medical record.   Sonia Baller Ashok Cordia, M.D. [facility] Pager:  [phone] After 3pm or if no response, call [phone] 7:35 AM 11/30/16

## 2016-11-30 NOTE — Progress Notes (Signed)
Nutrition Follow-up  DOCUMENTATION CODES:   Morbid obesity  INTERVENTION:  - Diet advancement as medically feasible. - RD will provide appropriate oral nutrition supplements, if warranted, with diet advancement. - Once diet advanced, monitor magnesium, potassium, and phosphorus daily for at least 3 days, MD to replete as needed, as pt is at risk for refeeding syndrome given no nutrition x7 days and poor nutrition for several weeks PTA.   NUTRITION DIAGNOSIS:   Inadequate oral intake related to inability to eat as evidenced by NPO status. 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  The patient reported the bleeding began on 3/7, was bright red and was associated with abdominal pain.  She was recently admitted from 2/16-2/24 for Nissen fundoplication in setting of hiatal hernia.  During the surgery, her liver was noted to have an abnormal appearance prompting biopsy which was positive for metastatic breast cancer. The patient was scheduled to have a PET scan 2/28 which showed cirrhotic / hypermetabolic liver consistent with metastatic disease.  She was unable to make her ONC follow up.  3/15 Pt extubated ~0815 this AM. Diet remains NPO; will monitor for diet advancement and associated needs. Weight has been mainly stable throughout admission. Pt has not had any nutrition x7 days and had poor appetite and intakes for several weeks PTA.   Medications reviewed; 40 mg IV Protonix BID.  Labs reviewed; K: 3.2 mmol/L, Ca: 8 mg/dL.   IVF: D5 @ 30 mL/hr (122 kcal).    3/13 - Pt remains intubated and no NGT or OGT in place at this time.  - Pt with breast cancer with liver mets and Dr. Ernestina Penna  note 3/9 PM states no plan for treatment at this time.  - Repeat upper EGD done 3/10 showing large esophageal ulceration with no active bleeding and a medium-sized hiatal hernia.  - Per PCCM notes, plan is to continue to hold off on TF at this time with hope of extubation in 24-48 hours; confirmed this with Dr. Ashok Cordia who reports plan for extubation as long as agitation is reduced/controlled.  - Noted that ammonia is trending down but remains elevated. - Weight today is -1 kg from previous assessment.  - TF recommendations outlined above should pt be unable to be extubated: If pt unable to be d/c as hoped, recommend NGT or OGT placement with Vital High Protein @ 20 mL/hr to advance by 10 mL every 8 hours to reach Vital High Protein @ 60 mL/hr. At goal rate, this regimen will provide 1440 kcal, 126 grams of protein, and 1204 mL free water..   Patient is currently intubated on ventilator support MV: 9 L/min Temp (24hrs), Avg:97.9 F (36.6 C), Min:97.1 F (36.2 C), Max:98.6 F (37 C)  BP: 111/64 and MAP: 79 IVF: D5 @ 30 mL/hr (122 kcal) Drip: Precedex @ 0.8 mcg/kg/hr.    3/9 - Spoke with pt's son, who is at bedside.  - He reports that PTA pt was not eating well since Nissen surgery, mainly consuming chocolate milk and ice cream.  - Son states that weight has been stable but that pt also has been retaining fluid and on Lasix; he is unable  to specify time frame for this.  - Son is visibly anxious about his mom's current condition.  - Unable to obtain much nutrition-related information during this visit and mainly provided active listening during visit.  - Physical assessment shows no muscle or fat wasting, mild edema throughout.  - Per chart review, pt has gained 10 lbs over the past 1 month which is likely fluid-related given pt's report of pt eating very poorly.   Patient is currently intubated on ventilator support. OGT in place to suction with blood in tubing and scant (<25cc) in  canister at this time. MV: 9L/min Temp (24hrs), Avg:98.1 F (36.7 C), Min:97.6 F (36.4 C), Max:98.8 F (37.1 C)  Propofol: none BP: 111/53 and MAP: 67 IVF:NS @ 125 mL/hr.  Drip:Fentanyl @ 75 mcg/hr.   Diet Order:  Diet NPO time specified  Skin:  Reviewed, no issues  Last BM:  3/14  Height:   Ht Readings from Last 1 Encounters:  11/28/16 5\' 6"  (1.676 m)    Weight:   Wt Readings from Last 1 Encounters:  11/30/16 251 lb 8.7 oz (114.1 kg)    Ideal Body Weight:  59.09 kg  BMI:  Body mass index is 40.6 kg/m.  Estimated Nutritional Needs:   Kcal:  1173-5670 (19-21 kcal/kg)  Protein:  115-125 grams   Fluid:  >/= 1.5 L/day  EDUCATION NEEDS:   No education needs identified at this time    Jarome Matin, MS, RD, LDN, CNSC Inpatient Clinical Dietitian Pager # [phone] After hours/weekend pager # [phone]

## 2016-12-01 DIAGNOSIS — R109 Unspecified abdominal pain: Secondary | ICD-10-CM

## 2016-12-01 LAB — CBC WITH DIFFERENTIAL/PLATELET
Basophils Absolute: 0 10*3/uL (ref 0.0–0.1)
Basophils Relative: 0 %
Eosinophils Absolute: 0.2 10*3/uL (ref 0.0–0.7)
Eosinophils Relative: 2 %
HCT: 29.3 % — ABNORMAL LOW (ref 36.0–46.0)
Hemoglobin: 9.8 g/dL — ABNORMAL LOW (ref 12.0–15.0)
LYMPHS ABS: 2.3 10*3/uL (ref 0.7–4.0)
Lymphocytes Relative: 19 %
MCH: 26.3 pg (ref 26.0–34.0)
MCHC: 33.4 g/dL (ref 30.0–36.0)
MCV: 78.6 fL (ref 78.0–100.0)
MONO ABS: 2.1 10*3/uL — AB (ref 0.1–1.0)
Monocytes Relative: 17 %
Neutro Abs: 7.6 10*3/uL (ref 1.7–7.7)
Neutrophils Relative %: 62 %
Platelets: 242 10*3/uL (ref 150–400)
RBC: 3.73 MIL/uL — ABNORMAL LOW (ref 3.87–5.11)
RDW: 16.8 % — AB (ref 11.5–15.5)
WBC: 12.2 10*3/uL — ABNORMAL HIGH (ref 4.0–10.5)

## 2016-12-01 LAB — RENAL FUNCTION PANEL
Albumin: 2.4 g/dL — ABNORMAL LOW (ref 3.5–5.0)
Anion gap: 7 (ref 5–15)
BUN: 14 mg/dL (ref 6–20)
CALCIUM: 8.2 mg/dL — AB (ref 8.9–10.3)
CO2: 22 mmol/L (ref 22–32)
CREATININE: 0.81 mg/dL (ref 0.44–1.00)
Chloride: 108 mmol/L (ref 101–111)
GFR calc non Af Amer: >60 mL/min (ref >60)
GLUCOSE: 99 mg/dL (ref 65–99)
Phosphorus: 2.6 mg/dL (ref 2.5–4.6)
Potassium: 3.3 mmol/L — ABNORMAL LOW (ref 3.5–5.1)
SODIUM: 137 mmol/L (ref 135–145)

## 2016-12-01 LAB — PROTIME-INR
INR: 1.19
Prothrombin Time: 15.2 seconds (ref 11.4–15.2)

## 2016-12-01 LAB — APTT: APTT: 36 s (ref 24–36)

## 2016-12-01 LAB — GLUCOSE, CAPILLARY: Glucose-Capillary: 87 mg/dL (ref 65–99)

## 2016-12-01 LAB — MAGNESIUM: Magnesium: 1.9 mg/dL (ref 1.7–2.4)

## 2016-12-01 MED ORDER — ALPRAZOLAM 0.5 MG PO TABS
0.5000 mg | ORAL_TABLET | Freq: Three times a day (TID) | ORAL | Status: DC | PRN
  Administered 2016-12-01 – 2016-12-10 (×11): 0.5 mg via ORAL
  Filled 2016-12-01 (×11): qty 1

## 2016-12-01 MED ORDER — FULVESTRANT 250 MG/5ML IM SOLN: 500.0000 mg | Freq: Once | INTRAMUSCULAR | Status: DC

## 2016-12-01 MED ORDER — POTASSIUM CHLORIDE CRYS ER 10 MEQ PO TBCR
40.0000 meq | EXTENDED_RELEASE_TABLET | Freq: Two times a day (BID) | ORAL | Status: DC
  Administered 2016-12-01: 20 meq via ORAL
  Filled 2016-12-01: qty 4

## 2016-12-01 MED ORDER — POTASSIUM CHLORIDE 10 MEQ/100ML IV SOLN
10.0000 meq | INTRAVENOUS | Status: AC
  Administered 2016-12-01 (×2): 10 meq via INTRAVENOUS
  Filled 2016-12-01 (×2): qty 100

## 2016-12-01 MED ORDER — ZOLPIDEM TARTRATE 5 MG PO TABS: 5.0000 mg | ORAL_TABLET | Freq: Once | ORAL | Status: DC

## 2016-12-01 MED ORDER — POTASSIUM CHLORIDE 20 MEQ/15ML (10%) PO SOLN: 40.0000 meq | Freq: Two times a day (BID) | ORAL | Status: DC

## 2016-12-01 NOTE — Progress Notes (Signed)
At 2028 patient began complaining of sharp sudden pain in her right lower abdomen.  This RN gave 86mcg of fentanyl; After thirty minutes there was little relief so this RN gave an additional 50 mcg of fentanyl. This RN called the E-link MD because the patient's HR had increased to the 120s, the patient had had multiple bloody stools, and the patient was very anxious.  The MD ordered labs, an abdominal XR, and a fluid bolus.  Will continue to monitor.

## 2016-12-01 NOTE — Progress Notes (Signed)
[facility] Attending Note  Presenting HPI:  66 y.o. female with new diagnosis of metastatic breast cancer to the liver admitted 3/8 with acute upper GIB in the setting of a large ulceration at the [facility] with active bleeding.    Subjective:  Extubated yesterday.  Had transient abd pain- X ray was ok. Feeling better now. Mild confusion.  Review of Systems:  Unable to obtain given intubation.     Temp:  [97.2 F (36.2 C)-99.1 F (37.3 C)] 98.9 F (37.2 C) (03/16 0815) Pulse Rate:  [56-137] 116 (03/16 0815) Resp:  [21-36] 33 (03/16 0815) BP: (85-171)/(39-96) 135/53 (03/16 0815) SpO2:  [79 %-100 %] 99 % (03/16 0815) Weight:  [255 lb 8.2 oz (115.9 kg)] 255 lb 8.2 oz (115.9 kg) (03/16 0500)  Gen.: Patient lying in bed. Talkative. No distress Integument: Rash over lt shin. No erythema, tenderness Neurological: Moving all 4 extremities equally. No focal deficits Pulmonary: Clear, anterior Cardiovascular: RRR, No MRG Abdomen: Soft, + BS  LINES/TUBES: OETT 3/8 >>> R IJ CVL 3/8 >>> Foley 3/8 >>>  CBC Latest Ref Rng & Units 12/01/2016 11/30/2016 11/30/2016  WBC 4.0 - 10.5 K/uL 12.2(H) 10.8(H) 5.1  Hemoglobin 12.0 - 15.0 g/dL 9.8(L) 10.7(L) 9.1(L)  Hematocrit 36.0 - 46.0 % 29.3(L) 31.0(L) 26.2(L)  Platelets 150 - 400 K/uL 242 233 125(L)   BMP Latest Ref Rng & Units 12/01/2016 11/30/2016 11/29/2016  Glucose 65 - 99 mg/dL 99 123(H) 118(H)  BUN 6 - 20 mg/dL 14 15 16   Creatinine 0.44 - 1.00 mg/dL 0.81 0.75 0.75  Sodium 135 - 145 mmol/L 137 138 135  Potassium 3.5 - 5.1 mmol/L 3.3(L) 3.2(L) 3.1(L)  Chloride 101 - 111 mmol/L 108 107 103  CO2 22 - 32 mmol/L 22 23 24   Calcium 8.9 - 10.3 mg/dL 8.2(L) 8.0(L) 8.0(L)   Hepatic Function Latest Ref Rng & Units 12/01/2016 11/30/2016 11/29/2016  Total Protein 6.5 - 8.1 g/dL - - 6.0(L)  Albumin 3.5 - 5.0 g/dL 2.4(L) 2.2(L) 2.3(L)  AST 15 - 41 U/L - - 155(H)  ALT 14 - 54 U/L - - 56(H)  Alk Phosphatase 38 - 126 U/L - - 133(H)  Total  Bilirubin 0.3 - 1.2 mg/dL - - 3.8(H)    IMAGING/STUDIES: PORT CXR 3/12: Right internal jugular central venous catheter and endotracheal tube in good position. Low lung volumes. CT HEAD W/O 3/12:  No acute abnormality.  Mild diffuse cerebral atrophy. PORT CXR 3/13:  Bilateral perihilar opacities unchanged compared with previous imaging. Right internal jugular central venous catheter and endotracheal tubes in good position. Abd x ray 3/15: No acute intraabd process. No ileues or free air. I have reviewed the images personally/  MICROBIOLOGY: MRSA PCR 3/8:  Negative   ANTIBIOTICS: Rocephin 3/9 - 3/11  SIGNIFICANT EVENTS: 03/08 - Admit w/ GIB >>> EGD performed & clip placed @ large ulceration at GE junction w/ active bleeding 03/10 - Repeat EGD w/o active bleeding 3/15- Extubated  ASSESSMENT/PLAN:  66 y.o. female admitted with acute upper GI bleed. Altered mental status seems to have largely resolved and she is following commands today. If patient tolerates spontaneous breathing trial would be reasonable to attempt extubation. Suspect bloody bowel movements overnight was due to mucosal irritation from inflation balloon from rectal tube.  Acute hypoxic resp failure Stable post extubation. Wean down O2 as tolerated  Mild delirium, anxiety Reorientation Xanax prn Haldol PRN  Sinus tachycardia, HTN Chronic diastolic heart failure. H/O CAD Holding lasix for now. Labetelol  PRN Holding antiplatelet agents  GIB, anemia,  Coagulopathy- resolved Transaminitis H/O cirrhosis, hepatic mets No more episodes of bleed Continue protonix Follow Hb Advance diet as tolerated  Hypokalemia Replete lytes  Metastatic breast cancer Follow up with oncology as outpt.  Husband and pt updated at bedside.  Marshell Garfinkel MD [facility] Pager [phone] If no answer or after 3pm call: [phone] 12/01/2016, 9:53 AM

## 2016-12-01 NOTE — Progress Notes (Signed)
Dr. Vaughan Browner made aware of patient episodes of bradycardia (HR 30s). During bradycardiac episodes, patient gets flushed in face and has slurred speech. BP stable during episodes. Bradycardia resolves spontaneously. Patient states during episode she was having the urge to have a bowel movement. Will continue to monitor.

## 2016-12-01 NOTE — Progress Notes (Signed)
Date:  December 01, 2016 Chart reviewed for concurrent status and case management needs. Will continue to follow patient progress. Discharge Planning: following for needs Expected discharge date: [id number] Maxemiliano Riel, BSN, RN3, CCM   [phone] 

## 2016-12-01 NOTE — Progress Notes (Signed)
eLink Physician-Brief Progress Note Patient Name: Alexandria [name] DOB: [birth year] MRN: [id number]   Date of Service  12/01/2016  HPI/Events of Note  Patient has not slept well the last several days.   eICU Interventions  Will order: 1. Ambien 5 mg PO at 10 PM.     Intervention Category Minor Interventions: Agitation / anxiety - evaluation and management  Lysle Dingwall 12/01/2016, 7:17 PM

## 2016-12-02 DIAGNOSIS — I10 Essential (primary) hypertension: Secondary | ICD-10-CM

## 2016-12-02 LAB — CBC WITH DIFFERENTIAL/PLATELET
BASOS ABS: 0 10*3/uL (ref 0.0–0.1)
BASOS PCT: 0 %
Eosinophils Absolute: 0.3 10*3/uL (ref 0.0–0.7)
Eosinophils Relative: 2 %
HCT: 29 % — ABNORMAL LOW (ref 36.0–46.0)
HEMOGLOBIN: 9.8 g/dL — AB (ref 12.0–15.0)
Lymphocytes Relative: 20 %
Lymphs Abs: 2.5 10*3/uL (ref 0.7–4.0)
MCH: 26.7 pg (ref 26.0–34.0)
MCHC: 33.8 g/dL (ref 30.0–36.0)
MCV: 79 fL (ref 78.0–100.0)
Monocytes Absolute: 2.2 10*3/uL — ABNORMAL HIGH (ref 0.1–1.0)
Monocytes Relative: 18 %
NEUTROS ABS: 7.5 10*3/uL (ref 1.7–7.7)
NEUTROS PCT: 60 %
Platelets: 253 10*3/uL (ref 150–400)
RBC: 3.67 MIL/uL — AB (ref 3.87–5.11)
RDW: 17.4 % — ABNORMAL HIGH (ref 11.5–15.5)
WBC: 12.5 10*3/uL — AB (ref 4.0–10.5)

## 2016-12-02 LAB — RENAL FUNCTION PANEL
Albumin: 2.4 g/dL — ABNORMAL LOW (ref 3.5–5.0)
Anion gap: 9 (ref 5–15)
BUN: 13 mg/dL (ref 6–20)
CALCIUM: 8.4 mg/dL — AB (ref 8.9–10.3)
CO2: 21 mmol/L — ABNORMAL LOW (ref 22–32)
Chloride: 108 mmol/L (ref 101–111)
Creatinine, Ser: 0.8 mg/dL (ref 0.44–1.00)
GFR calc Af Amer: >60 mL/min (ref >60)
Glucose, Bld: 106 mg/dL — ABNORMAL HIGH (ref 65–99)
Phosphorus: 2.9 mg/dL (ref 2.5–4.6)
Potassium: 3.7 mmol/L (ref 3.5–5.1)
SODIUM: 138 mmol/L (ref 135–145)

## 2016-12-02 LAB — PROTIME-INR
INR: 1.24
Prothrombin Time: 15.7 seconds — ABNORMAL HIGH (ref 11.4–15.2)

## 2016-12-02 LAB — MAGNESIUM: Magnesium: 1.8 mg/dL (ref 1.7–2.4)

## 2016-12-02 LAB — APTT: APTT: 39 s — AB (ref 24–36)

## 2016-12-02 MED ORDER — LISINOPRIL 2.5 MG PO TABS
5.0000 mg | ORAL_TABLET | Freq: Every day | ORAL | Status: DC
  Administered 2016-12-02 – 2016-12-04 (×3): 5 mg via ORAL
  Filled 2016-12-02 (×3): qty 2

## 2016-12-02 MED ORDER — ANASTROZOLE 1 MG PO TABS
1.0000 mg | ORAL_TABLET | Freq: Every day | ORAL | Status: DC
  Administered 2016-12-02: 1 mg via ORAL
  Filled 2016-12-02 (×2): qty 1

## 2016-12-02 MED ORDER — DIPHENHYDRAMINE HCL 25 MG PO CAPS
25.0000 mg | ORAL_CAPSULE | Freq: Every evening | ORAL | Status: DC | PRN
  Administered 2016-12-02 – 2016-12-06 (×5): 25 mg via ORAL
  Filled 2016-12-02 (×5): qty 1

## 2016-12-02 MED ORDER — PANTOPRAZOLE SODIUM 40 MG PO TBEC
40.0000 mg | DELAYED_RELEASE_TABLET | Freq: Two times a day (BID) | ORAL | Status: DC
  Administered 2016-12-02 – 2016-12-11 (×19): 40 mg via ORAL
  Filled 2016-12-02 (×19): qty 1

## 2016-12-02 MED ORDER — CARVEDILOL 3.125 MG PO TABS
3.1250 mg | ORAL_TABLET | Freq: Two times a day (BID) | ORAL | Status: DC
  Administered 2016-12-02 – 2016-12-11 (×17): 3.125 mg via ORAL
  Filled 2016-12-02 (×18): qty 1

## 2016-12-02 MED ORDER — ESCITALOPRAM OXALATE 10 MG PO TABS
10.0000 mg | ORAL_TABLET | Freq: Every day | ORAL | Status: DC
  Administered 2016-12-02 – 2016-12-10 (×9): 10 mg via ORAL
  Filled 2016-12-02 (×9): qty 1

## 2016-12-02 NOTE — Progress Notes (Addendum)
PROGRESS NOTE    Alexandria [name]  FOY:[id number] DOB: 02/19/1951 DOA: 11/23/2016 PCP: Nyoka Cowden, MD    Brief Narrative:  66 yo female with metastatic breast cancer, admitted to the intensive care on 11/23/16 due to shock associated to upper GI bleed, requiring invasive mechanical ventilation for airway protection. Frank hematemesis with abdominal pain. Endoscopy with ulcerated lesion at the GEJ, sp clips and epinephrine injection. Follow egd with no further bleeding. Liberated from mechanical ventilation on 03/15. Encephalopathy has been improving. Plan for physical therapy and transfer to medical ward.    Assessment & Plan:   Active Problems:   HTN (hypertension), Chronic Diastolic HF, h/o HLD and CAD.    Acute upper GI bleed   Leukocytosis   Acute blood loss anemia, Coagulopathy & mild thrombocytopenia    Electrolyte imbalance   Acute hypoxemic respiratory failure ([place]) in setting of hemorrhagic shock. LLL atelectasis. Pulmonary edema.    Acute encephalopathy, both metabolic and hepatic    Abnormal liver enzymes. Had CT imaging c/w cirrhosis but Biopsy negative.    Abdominal pain   1. Upper GI bleed with acute blood loss anemia, Hb and hct stable at 9.8 and 29, no signs of active bleeding, diet has been advanced. Will continue antiacid therapy with pantoprazole. No nausea or vomiting.   2. Metabolic encephalopathy. Clinically improved, patient able to respond to simple questions and follow commands, will continue neuro check per unit protocol, out of bed to chair as tolerated, physical therapy evaluation. Continue as needed alprazolam. Resume lexapro.   3. Chronic and stable diastolic heart failure. Clinically euvolemic, will continue blood pressure monitoring. Tachycardia has improved. As needed labetalol. Will resume coreg and lisinopril, hold on furosemide and metolazone for now.   4. Metastatic breast cancer. Will need follow up as outpatient. Resume anastrazole.     5. HTN. Systolic blood pressure up to 170's will resume lisinopril and coreg.    DVT prophylaxis: scd Code Status: Partial (no cpr) Family Communication: I spoke with patient's family at the bedside and all questions were addressed,  Disposition Plan: Home    Consultants:   GI   Procedures:    Antimicrobials:       Subjective: Patient feeling better, not able to sleep well last night. No chest pain or dyspnea, positive generalized weakness. No nausea or vomiting, no abdominal pain.   Objective: Vitals:   12/02/16 0300 12/02/16 0400 12/02/16 0500 12/02/16 0600  BP:  (!) 137/92    Pulse: 79 (!) 131 (!) 110 (!) 111  Resp: (!) 33 (!) 25 (!) 35 (!) 35  Temp:  98.9 F (37.2 C)    TempSrc:      SpO2: 100% 100% 100% 100%  Weight:  114 kg (251 lb 5.2 oz)    Height:        Intake/Output Summary (Last 24 hours) at 12/02/16 0818 Last data filed at 12/02/16 0730  Gross per 24 hour  Intake             1100 ml  Output              435 ml  Net              665 ml   Filed Weights   11/30/16 0258 12/01/16 0500 12/02/16 0400  Weight: 114.1 kg (251 lb 8.7 oz) 115.9 kg (255 lb 8.2 oz) 114 kg (251 lb 5.2 oz)    Examination:  General exam: deconditioned E ENT: mild pallor, no icterus,  oral mucosa moist.  Respiratory system:. Respiratory effort normal. Mild decreased breath sounds at bases, no wheezing, rales or rhonchi.  Cardiovascular system: S1 & S2 heard, RRR. No JVD, murmurs, rubs, gallops or clicks. ++ non pitting pedal edema. Gastrointestinal system: Abdomen with mild distention, soft and nontender. No organomegaly or masses felt. Normal bowel sounds heard. Central nervous system: Alert and oriented. No focal neurological deficits. Slow to respond to questions.  Extremities: Symmetric 5 x 5 power. Skin: No rashes, lesions or ulcers     Data Reviewed: I have personally reviewed following labs and imaging studies  CBC:  Recent Labs Lab 11/29/16 0415  11/29/16 1436 11/30/16 0429 11/30/16 2236 12/01/16 0520 12/02/16 0450  WBC 4.8 6.1 5.1 10.8* 12.2* 12.5*  NEUTROABS 2.8  --  3.1 6.6 7.6 7.5  HGB 9.0* 9.4* 9.1* 10.7* 9.8* 9.8*  HCT 27.0* 28.5* 26.2* 31.0* 29.3* 29.0*  MCV 80.4 80.1 79.6 78.7 78.6 79.0  PLT 137* 152 125* 233 242 956   Basic Metabolic Panel:  Recent Labs Lab 11/27/16 0423  11/28/16 0404 11/29/16 0415 11/30/16 0429 11/30/16 0430 12/01/16 0520 12/02/16 0450  NA 141  < > 140 135  --  138 137 138  K 2.9*  < > 3.2* 3.1*  --  3.2* 3.3* 3.7  CL 106  < > 105 103  --  107 108 108  CO2 28  < > 27 24  --  23 22 21*  GLUCOSE 109*  < > 99 118*  --  123* 99 106*  BUN 15  < > 17 16  --  15 14 13   CREATININE 0.76  < > 0.79 0.75  --  0.75 0.81 0.80  CALCIUM 8.1*  < > 8.2* 8.0*  --  8.0* 8.2* 8.4*  MG 1.6*  --  1.8 1.9 1.9  --  1.9 1.8  PHOS 2.8  --   --  2.9  --  3.0 2.6 2.9  < > = values in this interval not displayed. GFR: Estimated Creatinine Clearance: 92.3 mL/min (by C-G formula based on SCr of 0.8 mg/dL). Liver Function Tests:  Recent Labs Lab 11/28/16 0404 11/29/16 0415 11/30/16 0430 12/01/16 0520 12/02/16 0450  AST 165* 155*  --   --   --   ALT 55* 56*  --   --   --   ALKPHOS 142* 133*  --   --   --   BILITOT 3.2* 3.8*  --   --   --   PROT 5.9* 6.0*  --   --   --   ALBUMIN 2.5* 2.3* 2.2* 2.4* 2.4*    Recent Labs Lab 11/30/16 2236  LIPASE 46  AMYLASE 52    Recent Labs Lab 11/27/16 1018 11/28/16 0404  AMMONIA 147* 99*   Coagulation Profile:  Recent Labs Lab 11/27/16 0423 11/29/16 0415 11/30/16 0429 12/01/16 0520 12/02/16 0450  INR 2.59 1.25 1.33 1.19 1.24   Cardiac Enzymes:  Recent Labs Lab 11/28/16 0835 11/28/16 1415 11/28/16 1957  TROPONINI 0.03* <0.03 <0.03   BNP (last 3 results) No results for input(s): PROBNP in the last 8760 hours. HbA1C: No results for input(s): HGBA1C in the last 72 hours. CBG:  Recent Labs Lab 11/25/16 0955 11/26/16 0742 12/01/16 0823   GLUCAP 112* 84 87   Lipid Profile: No results for input(s): CHOL, HDL, LDLCALC, TRIG, CHOLHDL, LDLDIRECT in the last 72 hours. Thyroid Function Tests: No results for input(s): TSH, T4TOTAL, FREET4, T3FREE, THYROIDAB in the last  72 hours. Anemia Panel: No results for input(s): VITAMINB12, FOLATE, FERRITIN, TIBC, IRON, RETICCTPCT in the last 72 hours. Sepsis Labs: No results for input(s): PROCALCITON, LATICACIDVEN in the last 168 hours.  Recent Results (from the past 240 hour(s))  MRSA PCR Screening     Status: None   Collection Time: 11/23/16  4:58 PM  Result Value Ref Range Status   MRSA by PCR NEGATIVE NEGATIVE Final    Comment:        The GeneXpert MRSA Assay (FDA approved for NASAL specimens only), is one component of a comprehensive MRSA colonization surveillance program. It is not intended to diagnose MRSA infection nor to guide or monitor treatment for MRSA infections.          Radiology Studies: Dg Abd Portable 1v  Result Date: 11/30/2016 CLINICAL DATA:  Initial evaluation for acute abdominal pain, distention. EXAM: PORTABLE ABDOMEN - 1 VIEW COMPARISON:  None. FINDINGS: It is patent within normal limits without evidence for obstruction or ileus. No free air on these limited supine views the abdomen. No abnormal bowel wall thickening. No soft tissue mass or abnormal calcification. Scoliosis with multilevel degenerative spondylolysis noted within the visualized spine. No acute osseus abnormality. Partially visualized lung bases are grossly clear parent IMPRESSION: Nonobstructive bowel gas pattern with no radiographic evidence for acute intra-abdominal process. Electronically Signed   By: Jeannine Boga M.D.   On: 11/30/2016 23:46        Scheduled Meds: . fentaNYL (SUBLIMAZE) injection  50 mcg Intravenous Once  . mouth rinse  15 mL Mouth Rinse QID  . pantoprazole  40 mg Oral BID  . zolpidem  5 mg Oral Once   Continuous Infusions: . sodium chloride 10  mL/hr at 12/02/16 0500     LOS: 9 days       Jacquese Cassarino Gerome Apley, MD [facility] Pager [phone]  If 7PM-7AM, please contact night-coverage [email or web address] Password [facility] 12/02/2016, 8:18 AM

## 2016-12-03 DIAGNOSIS — K264 Chronic or unspecified duodenal ulcer with hemorrhage: Secondary | ICD-10-CM

## 2016-12-03 LAB — BASIC METABOLIC PANEL
Anion gap: 8 (ref 5–15)
BUN: 14 mg/dL (ref 6–20)
CHLORIDE: 109 mmol/L (ref 101–111)
CO2: 23 mmol/L (ref 22–32)
Calcium: 8.4 mg/dL — ABNORMAL LOW (ref 8.9–10.3)
Creatinine, Ser: 0.77 mg/dL (ref 0.44–1.00)
GFR calc Af Amer: >60 mL/min (ref >60)
GFR calc non Af Amer: >60 mL/min (ref >60)
GLUCOSE: 98 mg/dL (ref 65–99)
POTASSIUM: 3.5 mmol/L (ref 3.5–5.1)
Sodium: 140 mmol/L (ref 135–145)

## 2016-12-03 LAB — CBC WITH DIFFERENTIAL/PLATELET
BASOS PCT: 0 %
Basophils Absolute: 0 10*3/uL (ref 0.0–0.1)
Eosinophils Absolute: 0.4 10*3/uL (ref 0.0–0.7)
Eosinophils Relative: 3 %
HCT: 27.6 % — ABNORMAL LOW (ref 36.0–46.0)
Hemoglobin: 9.3 g/dL — ABNORMAL LOW (ref 12.0–15.0)
LYMPHS ABS: 2.7 10*3/uL (ref 0.7–4.0)
Lymphocytes Relative: 22 %
MCH: 27 pg (ref 26.0–34.0)
MCHC: 33.7 g/dL (ref 30.0–36.0)
MCV: 80 fL (ref 78.0–100.0)
Monocytes Absolute: 2.2 10*3/uL — ABNORMAL HIGH (ref 0.1–1.0)
Monocytes Relative: 18 %
NEUTROS ABS: 6.8 10*3/uL (ref 1.7–7.7)
NEUTROS PCT: 57 %
PLATELETS: 227 10*3/uL (ref 150–400)
RBC: 3.45 MIL/uL — ABNORMAL LOW (ref 3.87–5.11)
RDW: 17.7 % — AB (ref 11.5–15.5)
WBC: 12.1 10*3/uL — ABNORMAL HIGH (ref 4.0–10.5)

## 2016-12-03 LAB — BLOOD GAS, ARTERIAL
ACID-BASE DEFICIT: 2.7 mmol/L — AB (ref 0.0–2.0)
Bicarbonate: 21 mmol/L (ref 20.0–28.0)
Drawn by: 11249
O2 CONTENT: 2 L/min
O2 SAT: 95.3 %
PATIENT TEMPERATURE: 98
pCO2 arterial: 33.8 mmHg (ref 32.0–48.0)
pH, Arterial: 7.409 (ref 7.350–7.450)
pO2, Arterial: 76.8 mmHg — ABNORMAL LOW (ref 83.0–108.0)

## 2016-12-03 LAB — AMMONIA: AMMONIA: 47 umol/L — AB (ref 9–35)

## 2016-12-03 LAB — MAGNESIUM: MAGNESIUM: 1.9 mg/dL (ref 1.7–2.4)

## 2016-12-03 LAB — PROTIME-INR
INR: 1.29
Prothrombin Time: 16.1 seconds — ABNORMAL HIGH (ref 11.4–15.2)

## 2016-12-03 LAB — ALBUMIN: Albumin: 2.3 g/dL — ABNORMAL LOW (ref 3.5–5.0)

## 2016-12-03 LAB — APTT: aPTT: 41 seconds — ABNORMAL HIGH (ref 24–36)

## 2016-12-03 LAB — PHOSPHORUS: Phosphorus: 2.9 mg/dL (ref 2.5–4.6)

## 2016-12-03 MED ORDER — LACTULOSE 10 GM/15ML PO SOLN: 30.0000 g | Freq: Four times a day (QID) | ORAL | Status: DC

## 2016-12-03 MED ORDER — LACTULOSE 10 GM/15ML PO SOLN
30.0000 g | Freq: Three times a day (TID) | ORAL | Status: DC
  Administered 2016-12-03 – 2016-12-11 (×18): 30 g via ORAL
  Filled 2016-12-03: qty 60
  Filled 2016-12-03: qty 45
  Filled 2016-12-03: qty 60
  Filled 2016-12-03: qty 45
  Filled 2016-12-03: qty 60
  Filled 2016-12-03 (×6): qty 45
  Filled 2016-12-03: qty 60
  Filled 2016-12-03 (×2): qty 45
  Filled 2016-12-03 (×4): qty 60
  Filled 2016-12-03: qty 45

## 2016-12-03 MED ORDER — ORAL CARE MOUTH RINSE
15.0000 mL | Freq: Two times a day (BID) | OROMUCOSAL | Status: DC
  Administered 2016-12-04 – 2016-12-11 (×12): 15 mL via OROMUCOSAL

## 2016-12-03 MED ORDER — PHENOL 1.4 % MT LIQD
1.0000 | OROMUCOSAL | Status: DC | PRN
  Filled 2016-12-03: qty 177

## 2016-12-03 MED ORDER — DEXTROSE IN LACTATED RINGERS 5 % IV SOLN
INTRAVENOUS | Status: DC
  Administered 2016-12-03 – 2016-12-05 (×5): via INTRAVENOUS

## 2016-12-03 NOTE — Evaluation (Signed)
Physical Therapy Evaluation Patient Details Name: Alexandria [name] MRN: [id number] DOB: [birth year] Today's Date: 12/03/2016   History of Present Illness  Pt admitted with N/V, abd pain, hematemesis 2* upper GIB, and acute encephalopathy.  Pt admitted to ICU and placed on vent - extubated 11/30/16/  Pt with hx of metastatic breast CA, lymphadema, fibromyalgia, DDD, and chronic back pain.  Clinical Impression  Pt admitted as above and presenting with functional mobility limitations 2* generalized weakness, obesity, limited endurance and balance deficits.  Pt would benefit from follow up rehab at SNF level to maximize IND and safety prior to return home.    Follow Up Recommendations SNF    Equipment Recommendations  None recommended by PT    Recommendations for Other Services OT consult     Precautions / Restrictions Precautions Precautions: Fall Restrictions Weight Bearing Restrictions: No      Mobility  Bed Mobility Overal bed mobility: Needs Assistance Bed Mobility: Rolling;Sidelying to Sit Rolling: Min assist;Mod assist Sidelying to sit: Mod assist       General bed mobility comments: cues for sequence and log roll technique.  Transfers Overall transfer level: Needs assistance   Transfers: Sit to/from Stand;Stand Pivot Transfers Sit to Stand: Min assist;Mod assist;+2 physical assistance;+2 safety/equipment;From elevated surface Stand pivot transfers: Min assist;Mod assist;+2 physical assistance;+2 safety/equipment;From elevated surface       General transfer comment: Increased time and cues for use of UEs to self assist.    Ambulation/Gait             General Gait Details: stand pvt only  Stairs            Wheelchair Mobility    Modified Rankin (Stroke Patients Only)       Balance Overall balance assessment: Needs assistance Sitting-balance support: Bilateral upper extremity supported Sitting balance-Leahy Scale: Fair       Standing  balance-Leahy Scale: Poor                               Pertinent Vitals/Pain Pain Assessment: No/denies pain    Home Living Family/patient expects to be discharged to:: Private residence Living Arrangements: Spouse/significant other Available Help at Discharge: Family Type of Home: Apartment Home Access: Level entry     Home Layout: One level Home Equipment: Cane - single point      Prior Function Level of Independence: Independent with assistive device(s);Needs assistance   Gait / Transfers Assistance Needed: Used cane for ambulation           Hand Dominance   Dominant Hand: Right    Extremity/Trunk Assessment   Upper Extremity Assessment Upper Extremity Assessment: Generalized weakness    Lower Extremity Assessment Lower Extremity Assessment: Generalized weakness       Communication   Communication: Expressive difficulties ( pt with very soft voice and frequently unintelligible )  Cognition Arousal/Alertness: Awake/alert Behavior During Therapy: WFL for tasks assessed/performed Overall Cognitive Status: Within Functional Limits for tasks assessed                      General Comments      Exercises     Assessment/Plan    PT Assessment Patient needs continued PT services  PT Problem List Decreased strength;Decreased activity tolerance;Decreased balance;Decreased mobility;Decreased knowledge of use of DME;Obesity       PT Treatment Interventions DME instruction;Gait training;Stair training;Functional mobility training;Therapeutic activities;Therapeutic exercise;Patient/family education    PT Goals (  Current goals can be found in the Care Plan section)  Acute Rehab PT Goals Patient Stated Goal: Regain strength PT Goal Formulation: With patient Time For Goal Achievement: 12/16/16 Potential to Achieve Goals: Fair    Frequency Min 3X/week   Barriers to discharge        Co-evaluation               End of Session  Equipment Utilized During Treatment: Gait belt;Oxygen Activity Tolerance: Patient limited by fatigue Patient left: in chair;with call bell/phone within reach;with chair alarm set Nurse Communication: Mobility status PT Visit Diagnosis: Unsteadiness on feet (R26.81);Muscle weakness (generalized) (M62.81);Difficulty in walking, not elsewhere classified (R26.2)         Time: 1021-1173 PT Time Calculation (min) (ACUTE ONLY): 28 min   Charges:   PT Evaluation $PT Eval Moderate Complexity: 1 Procedure PT Treatments $Therapeutic Activity: 8-22 mins   PT G Codes:         Ruwayda Curet 12/03/2016, 5:40 PM

## 2016-12-03 NOTE — Progress Notes (Addendum)
Pt stood and transferred to chair today as 2 person assist with PT. When moving pt from the chair back to the bed, the pt became tachycardic with HR into the 140's, felt "dizzy", and became very lethargic. The pt was transferred back to bed safely with a walker and 3 person assist, but remained tachycardic and lethargic after the event. The pt was able to move all extremities equally, pupils were equal and reactive bilaterally, was able to answer orientation questions, and stated that she felt "out of it". Pt blood sugar was 120 and blood pressure was 122/64 at the time of the event. Pt is now resting in bed. Will continue to monitor. S.Uzziah Rigg, RN

## 2016-12-03 NOTE — Progress Notes (Signed)
PROGRESS NOTE    Alexandria [name]  DGU:[id number] DOB: 24-May-1951 DOA: 11/23/2016 PCP: Nyoka Cowden, MD    Brief Narrative:  66 yo female with metastatic breast cancer, admitted to the intensive care on 11/23/16 due to shock associated to upper GI bleed, requiring invasive mechanical ventilation for airway protection. Frank hematemesis with abdominal pain. Endoscopy with ulcerated lesion at the GEJ, sp clips and epinephrine injection. Follow egd with no further bleeding. Liberated from mechanical ventilation on 03/15. Encephalopathy has been improving. Plan for physical therapy.    Assessment & Plan:   Active Problems:   HTN (hypertension), Chronic Diastolic HF, h/o HLD and CAD.    Acute upper GI bleed   Leukocytosis   Acute blood loss anemia, Coagulopathy & mild thrombocytopenia    Electrolyte imbalance   Acute hypoxemic respiratory failure ([place]) in setting of hemorrhagic shock. LLL atelectasis. Pulmonary edema.    Acute encephalopathy, both metabolic and hepatic    Abnormal liver enzymes. Had CT imaging c/w cirrhosis but Biopsy negative.    Abdominal pain    1. Upper GI bleed with acute blood loss anemia, Hb and hct stable at 9.3 and 27, persistent dark stools, tolerating po well, will continue antiacid therapy with protonix bid.  2. Metabolic encephalopathy.  Neuro check per unit protocol, patient very weak and deconditioned, follow on physical therapy recommendations. Continue as needed alprazolam and qhs lexapro. Will continue care in the step down unit for the next 24 hours due to high risk of decompensation. Poor oral intake will add IV fluids with dextrose at a low rate.   3. Chronic and stable diastolic heart failure. Continue blood pressure monitoring. Continue with coreg and lisinopril, clinically euvolemic will continue to hold on furosemide and metolazone.   4. Metastatic breast cancer. Will need follow up as outpatient. Follow oncology recommendations, for now  holding on treatment due to acute illness.    5. HTN. Systolic blood pressure 956 to 150, tolerating well lisinopril and coreg.    DVT prophylaxis: scd Code Status: Partial (no cpr) Family Communication: I spoke with patient's family at the bedside and all questions were addressed,  Disposition Plan: Home    Consultants:   GI   Procedures:  Upper endoscopy 03/8 and 03/10.   Antimicrobials:    Subjective: Patient very weak and deconditioned, very poor appetite, continue to have dark stools. No dyspnea or chest pain.   Objective: Vitals:   12/03/16 0400 12/03/16 0500 12/03/16 0600 12/03/16 0700  BP: (!) 156/60  (!) 167/80   Pulse: 97 88 (!) 112 (!) 116  Resp: (!) 33 (!) 30 (!) 29 (!) 34  Temp:  98 F (36.7 C)    TempSrc:  Oral    SpO2: 99% 96% 96% 99%  Weight:   115 kg (253 lb 8.5 oz)   Height:        Intake/Output Summary (Last 24 hours) at 12/03/16 0756 Last data filed at 12/02/16 2100  Gross per 24 hour  Intake              200 ml  Output              490 ml  Net             -290 ml   Filed Weights   12/01/16 0500 12/02/16 0400 12/03/16 0600  Weight: 115.9 kg (255 lb 8.2 oz) 114 kg (251 lb 5.2 oz) 115 kg (253 lb 8.5 oz)    Examination:  General  exam: very weak and deconditioned E ENT: Mild pallor and icterus, oral mucosa dry.  Respiratory system: Respiratory effort normal. Mild decreased breath sounds at bases, no wheezing, rales or rhonchi.  Cardiovascular system: S1 & S2 heard, RRR. No JVD, murmurs, rubs, gallops or clicks. No pedal edema. Gastrointestinal system: Abdomen is distended, timpanic, soft and nontender. No organomegaly or masses felt. Normal bowel sounds heard. Central nervous system: Alert and oriented. No focal neurological deficits. Extremities: Symmetric 5 x 5 power. Skin: No rashes, lesions or ulcers     Data Reviewed: I have personally reviewed following labs and imaging studies  CBC:  Recent Labs Lab 11/30/16 0429  11/30/16 2236 12/01/16 0520 12/02/16 0450 12/03/16 0456  WBC 5.1 10.8* 12.2* 12.5* 12.1*  NEUTROABS 3.1 6.6 7.6 7.5 6.8  HGB 9.1* 10.7* 9.8* 9.8* 9.3*  HCT 26.2* 31.0* 29.3* 29.0* 27.6*  MCV 79.6 78.7 78.6 79.0 80.0  PLT 125* 233 242 253 144   Basic Metabolic Panel:  Recent Labs Lab 11/27/16 0423  11/29/16 0415 11/30/16 0429 11/30/16 0430 12/01/16 0520 12/02/16 0450 12/03/16 0456  NA 141  < > 135  --  138 137 138 140  K 2.9*  < > 3.1*  --  3.2* 3.3* 3.7 3.5  CL 106  < > 103  --  107 108 108 109  CO2 28  < > 24  --  23 22 21* 23  GLUCOSE 109*  < > 118*  --  123* 99 106* 98  BUN 15  < > 16  --  15 14 13 14   CREATININE 0.76  < > 0.75  --  0.75 0.81 0.80 0.77  CALCIUM 8.1*  < > 8.0*  --  8.0* 8.2* 8.4* 8.4*  MG 1.6*  < > 1.9 1.9  --  1.9 1.8 1.9  PHOS 2.8  --  2.9  --  3.0 2.6 2.9  --   < > = values in this interval not displayed. GFR: Estimated Creatinine Clearance: 92.7 mL/min (by C-G formula based on SCr of 0.77 mg/dL). Liver Function Tests:  Recent Labs Lab 11/28/16 0404 11/29/16 0415 11/30/16 0430 12/01/16 0520 12/02/16 0450  AST 165* 155*  --   --   --   ALT 55* 56*  --   --   --   ALKPHOS 142* 133*  --   --   --   BILITOT 3.2* 3.8*  --   --   --   PROT 5.9* 6.0*  --   --   --   ALBUMIN 2.5* 2.3* 2.2* 2.4* 2.4*    Recent Labs Lab 11/30/16 2236  LIPASE 46  AMYLASE 52    Recent Labs Lab 11/27/16 1018 11/28/16 0404  AMMONIA 147* 99*   Coagulation Profile:  Recent Labs Lab 11/29/16 0415 11/30/16 0429 12/01/16 0520 12/02/16 0450 12/03/16 0456  INR 1.25 1.33 1.19 1.24 1.29   Cardiac Enzymes:  Recent Labs Lab 11/28/16 0835 11/28/16 1415 11/28/16 1957  TROPONINI 0.03* <0.03 <0.03   BNP (last 3 results) No results for input(s): PROBNP in the last 8760 hours. HbA1C: No results for input(s): HGBA1C in the last 72 hours. CBG:  Recent Labs Lab 12/01/16 0823  GLUCAP 87   Lipid Profile: No results for input(s): CHOL, HDL, LDLCALC,  TRIG, CHOLHDL, LDLDIRECT in the last 72 hours. Thyroid Function Tests: No results for input(s): TSH, T4TOTAL, FREET4, T3FREE, THYROIDAB in the last 72 hours. Anemia Panel: No results for input(s): VITAMINB12, FOLATE, FERRITIN, TIBC, IRON,  RETICCTPCT in the last 72 hours. Sepsis Labs: No results for input(s): PROCALCITON, LATICACIDVEN in the last 168 hours.  Recent Results (from the past 240 hour(s))  MRSA PCR Screening     Status: None   Collection Time: 11/23/16  4:58 PM  Result Value Ref Range Status   MRSA by PCR NEGATIVE NEGATIVE Final    Comment:        The GeneXpert MRSA Assay (FDA approved for NASAL specimens only), is one component of a comprehensive MRSA colonization surveillance program. It is not intended to diagnose MRSA infection nor to guide or monitor treatment for MRSA infections.          Radiology Studies: No results found.      Scheduled Meds: . anastrozole  1 mg Oral Daily  . carvedilol  3.125 mg Oral BID WC  . escitalopram  10 mg Oral QHS  . fentaNYL (SUBLIMAZE) injection  50 mcg Intravenous Once  . lisinopril  5 mg Oral Daily  . mouth rinse  15 mL Mouth Rinse QID  . pantoprazole  40 mg Oral BID  . zolpidem  5 mg Oral Once   Continuous Infusions: . sodium chloride 10 mL/hr at 12/02/16 0500     LOS: 10 days        Mauricio Gerome Apley, MD [facility] Pager [phone]  If 7PM-7AM, please contact night-coverage [email or web address] Password [facility] 12/03/2016, 7:56 AM

## 2016-12-04 ENCOUNTER — Inpatient Hospital Stay (HOSPITAL_COMMUNITY): Payer: Medicare Other

## 2016-12-04 DIAGNOSIS — J9811 Atelectasis: Secondary | ICD-10-CM

## 2016-12-04 LAB — CBC WITH DIFFERENTIAL/PLATELET
BASOS ABS: 0 10*3/uL (ref 0.0–0.1)
Basophils Relative: 0 %
EOS ABS: 0.3 10*3/uL (ref 0.0–0.7)
EOS PCT: 4 %
HCT: 24.7 % — ABNORMAL LOW (ref 36.0–46.0)
Hemoglobin: 8.2 g/dL — ABNORMAL LOW (ref 12.0–15.0)
Lymphocytes Relative: 20 %
Lymphs Abs: 1.6 10*3/uL (ref 0.7–4.0)
MCH: 26.5 pg (ref 26.0–34.0)
MCHC: 33.2 g/dL (ref 30.0–36.0)
MCV: 79.9 fL (ref 78.0–100.0)
Monocytes Absolute: 1.6 10*3/uL — ABNORMAL HIGH (ref 0.1–1.0)
Monocytes Relative: 20 %
Neutro Abs: 4.4 10*3/uL (ref 1.7–7.7)
Neutrophils Relative %: 56 %
PLATELETS: 153 10*3/uL (ref 150–400)
RBC: 3.09 MIL/uL — AB (ref 3.87–5.11)
RDW: 17.6 % — AB (ref 11.5–15.5)
WBC: 7.8 10*3/uL (ref 4.0–10.5)

## 2016-12-04 LAB — RENAL FUNCTION PANEL
ALBUMIN: 2 g/dL — AB (ref 3.5–5.0)
Anion gap: 4 — ABNORMAL LOW (ref 5–15)
BUN: 11 mg/dL (ref 6–20)
CO2: 23 mmol/L (ref 22–32)
CREATININE: 0.59 mg/dL (ref 0.44–1.00)
Calcium: 8.1 mg/dL — ABNORMAL LOW (ref 8.9–10.3)
Chloride: 115 mmol/L — ABNORMAL HIGH (ref 101–111)
GFR calc Af Amer: >60 mL/min (ref >60)
Glucose, Bld: 116 mg/dL — ABNORMAL HIGH (ref 65–99)
PHOSPHORUS: 2.4 mg/dL — AB (ref 2.5–4.6)
Potassium: 3.1 mmol/L — ABNORMAL LOW (ref 3.5–5.1)
SODIUM: 142 mmol/L (ref 135–145)

## 2016-12-04 LAB — AMMONIA: AMMONIA: 54 umol/L — AB (ref 9–35)

## 2016-12-04 LAB — PROTIME-INR
INR: 1.43
Prothrombin Time: 17.5 seconds — ABNORMAL HIGH (ref 11.4–15.2)

## 2016-12-04 LAB — APTT: APTT: 41 s — AB (ref 24–36)

## 2016-12-04 LAB — GLUCOSE, CAPILLARY: Glucose-Capillary: 120 mg/dL — ABNORMAL HIGH (ref 65–99)

## 2016-12-04 LAB — MAGNESIUM: Magnesium: 1.9 mg/dL (ref 1.7–2.4)

## 2016-12-04 MED ORDER — MORPHINE SULFATE (PF) 4 MG/ML IV SOLN
2.0000 mg | INTRAVENOUS | Status: AC
  Administered 2016-12-04: 2 mg via INTRAVENOUS
  Filled 2016-12-04: qty 1

## 2016-12-04 MED ORDER — CHLORHEXIDINE GLUCONATE CLOTH 2 % EX PADS
6.0000 | MEDICATED_PAD | Freq: Every day | CUTANEOUS | Status: DC
  Administered 2016-12-04 – 2016-12-11 (×4): 6 via TOPICAL

## 2016-12-04 MED ORDER — MORPHINE SULFATE (PF) 4 MG/ML IV SOLN: 2.0000 mg | INTRAVENOUS | Status: DC | PRN

## 2016-12-04 MED ORDER — CHLORHEXIDINE GLUCONATE CLOTH 2 % EX PADS: 6.0000 | MEDICATED_PAD | Freq: Every day | CUTANEOUS | Status: DC

## 2016-12-04 MED ORDER — IPRATROPIUM-ALBUTEROL 0.5-2.5 (3) MG/3ML IN SOLN
3.0000 mL | RESPIRATORY_TRACT | Status: DC | PRN
  Administered 2016-12-04 – 2016-12-05 (×2): 3 mL via RESPIRATORY_TRACT
  Filled 2016-12-04 (×2): qty 3

## 2016-12-04 MED ORDER — SODIUM CHLORIDE 0.9 % IV BOLUS (SEPSIS)
500.0000 mL | Freq: Once | INTRAVENOUS | Status: AC
  Administered 2016-12-04: 500 mL via INTRAVENOUS

## 2016-12-04 MED ORDER — MORPHINE SULFATE (PF) 4 MG/ML IV SOLN
1.0000 mg | INTRAVENOUS | Status: DC | PRN
  Administered 2016-12-04 (×3): 1 mg via INTRAVENOUS
  Filled 2016-12-04 (×3): qty 1

## 2016-12-04 NOTE — NC FL2 (Signed)
[place] LEVEL OF CARE SCREENING TOOL     IDENTIFICATION  Patient Name: Alexandria [name] Birthdate: [birth year] Sex: female Admission Date (Current Location): 11/23/2016  [facility] and [place] Number:  Herbalist and Address:  [facility],  [address] [place], [place]      Provider Number: [id number]  Attending Physician Name and Address:  Tawni Millers, *  Relative Name and Phone Number:       Current Level of Care: Hospital Recommended Level of Care: [place] Prior Approval Number:    Date Approved/Denied:   PASRR Number: [id number]  Discharge Plan: SNF    Current Diagnoses: Patient Active Problem List   Diagnosis Date Noted  . Abdominal pain   . Acute blood loss anemia, Coagulopathy & mild thrombocytopenia  11/27/2016  . Electrolyte imbalance 11/27/2016  . Acute hypoxemic respiratory failure ([facility]) in setting of hemorrhagic shock. LLL atelectasis. Pulmonary edema.  11/27/2016  . Acute encephalopathy, both metabolic and hepatic  46/27/0350  . Abnormal liver enzymes. Had CT imaging c/w cirrhosis but Biopsy negative.  11/27/2016  . Encounter for imaging study to confirm orogastric (OG) tube placement   . Encounter for intubation   . Leukocytosis   . Acute upper GI bleed 11/23/2016  . GIB (gastrointestinal bleeding) 11/23/2016  . Metastatic breast cancer ([place]) 11/11/2016  . Status post laparoscopic Nissen fundoplication 09/38/1829  . Asthma 08/21/2016  . Malignant neoplasm of upper-outer quadrant of female breast ([place])   . Hyperlipidemia 10/23/2014  . Essential hypertension   . Acute respiratory failure with hypoxia ([place]) 09/07/2014  . Lymphedema 09/07/2014  . Nocturnal hypoxemia 08/18/2014  . Ventral incisional hernia with obstruction 07/05/2014  . Coronary artery calcification 05/22/2014  . Mediastinal lymphadenopathy 01/29/2014  . Hiatal hernia 01/23/2014  . Symptomatic anemia 11/19/2013  . Hx of  radiation therapy   . SVT (supraventricular tachycardia) ([place]) 04/19/2013  . Vocal cord dysfunction 04/15/2013  . Acute on chronic diastolic CHF (congestive heart failure) ([place]) 04/14/2013  . Anxiety state 04/14/2013  . Hypokalemia 04/13/2013  . Iron deficiency anemia 04/13/2013  . HTN (hypertension), Chronic Diastolic HF, h/o HLD and CAD.  04/08/2013  . Chronic back pain 04/08/2013  . Morbid obesity ([place]) 04/08/2013  . Adjustment disorder with mixed anxiety and depressed mood 04/08/2013  . Lower extremity edema 04/08/2013  . Systolic murmur 93/71/6967  . Chronic constipation 04/08/2013  . Breast cancer, right breast ([place]) 02/21/2013    Orientation RESPIRATION BLADDER Height & Weight     Self, Time, Situation, Place  O2 Indwelling catheter Weight: 251 lb 1.7 oz (113.9 kg) Height:  5\' 6"  (167.6 cm)  BEHAVIORAL SYMPTOMS/MOOD NEUROLOGICAL BOWEL NUTRITION STATUS  Other (Comment) (no behaviors)   Incontinent Diet (Clear liquid- expect to progress)  AMBULATORY STATUS COMMUNICATION OF NEEDS Skin   Extensive Assist Verbally Normal                       Personal Care Assistance Level of Assistance  Bathing, Feeding, Dressing Bathing Assistance: Maximum assistance Feeding assistance: Limited assistance Dressing Assistance: Maximum assistance     Functional Limitations Info  Sight, Hearing, Speech Sight Info: Adequate Hearing Info: Adequate Speech Info: Adequate    SPECIAL CARE FACTORS FREQUENCY  PT (By licensed PT), OT (By licensed OT)     PT Frequency: 5x wk OT Frequency: 5x wk            Contractures Contractures Info: Not present  Additional Factors Info  Code Status, Psychotropic, Allergies Code Status Info: Partial Code   Psychotropic Info: Lexapro, ativan         Current Medications (12/04/2016):  This is the current hospital active medication list Current Facility-Administered Medications  Medication Dose Route Frequency Provider Last Rate Last Dose   . 0.9 %  sodium chloride infusion  250 mL Intravenous PRN Donita Brooks, NP      . ALPRAZolam Duanne Moron) tablet 0.5 mg  0.5 mg Oral TID PRN Marshell Garfinkel, MD   0.5 mg at 12/04/16 1123  . carvedilol (COREG) tablet 3.125 mg  3.125 mg Oral BID WC Tawni Millers, MD   3.125 mg at 12/04/16 0801  . Chlorhexidine Gluconate Cloth 2 % PADS 6 each  6 each Topical Q0600 Mauricio Gerome Apley, MD   6 each at 12/04/16 0600  . dextrose 5 % in lactated ringers infusion   Intravenous Continuous Tawni Millers, MD 50 mL/hr at 12/04/16 1500    . diphenhydrAMINE (BENADRYL) capsule 25 mg  25 mg Oral QHS PRN Tawni Millers, MD   25 mg at 12/03/16 2112  . escitalopram (LEXAPRO) tablet 10 mg  10 mg Oral QHS Tawni Millers, MD   10 mg at 12/03/16 2112  . fentaNYL (SUBLIMAZE) injection 50 mcg  50 mcg Intravenous Once Donita Brooks, NP      . ipratropium-albuterol (DUONEB) 0.5-2.5 (3) MG/3ML nebulizer solution 3 mL  3 mL Nebulization Q2H PRN Mauricio Gerome Apley, MD   3 mL at 12/04/16 0930  . labetalol (NORMODYNE,TRANDATE) injection 10 mg  10 mg Intravenous Q4H PRN Raylene Miyamoto, MD   10 mg at 12/02/16 2054  . lactulose (CHRONULAC) 10 GM/15ML solution 30 g  30 g Oral TID Tawni Millers, MD   30 g at 12/04/16 0925  . lip balm (CARMEX) ointment   Topical PRN Juanito Doom, MD      . lisinopril (PRINIVIL,ZESTRIL) tablet 5 mg  5 mg Oral Daily Mauricio Gerome Apley, MD   5 mg at 12/04/16 0925  . MEDLINE mouth rinse  15 mL Mouth Rinse BID Mauricio Gerome Apley, MD   15 mL at 12/04/16 0925  . morphine 4 MG/ML injection 1 mg  1 mg Intravenous Q4H PRN Tawni Millers, MD   1 mg at 12/04/16 1324  . ondansetron (ZOFRAN) injection 4 mg  4 mg Intravenous Q6H PRN Fatima Blank, MD   4 mg at 11/23/16 1043  . ondansetron (ZOFRAN) injection 4 mg  4 mg Intravenous Q6H PRN Donita Brooks, NP      . oxyCODONE (Oxy IR/ROXICODONE) immediate release tablet 5 mg  5 mg Oral Q3H  PRN Rush Farmer, MD   5 mg at 12/04/16 1442  . pantoprazole (PROTONIX) EC tablet 40 mg  40 mg Oral BID Tawni Millers, MD   40 mg at 12/04/16 0925  . phenol (CHLORASEPTIC) mouth spray 1 spray  1 spray Mouth/Throat PRN Mauricio Gerome Apley, MD      . zolpidem Lorrin Mais) tablet 5 mg  5 mg Oral Once Anders Simmonds, MD         Discharge Medications: Please see discharge summary for a list of discharge medications.  Relevant Imaging Results:  Relevant Lab Results:   Additional Information SS # [id number]  Fayette Gasner, Randall An, LCSW

## 2016-12-04 NOTE — Progress Notes (Signed)
Date:  December 04, 2016 Chart reviewed for concurrent status and case management needs. Will continue to follow patient progress.  Extubated on [id number],SEGBTD episodes of bradycardia and dizziness. Discharge Planning: following for needs Expected discharge date: [id number] Velva Harman, BSN, [place], [place]

## 2016-12-04 NOTE — Progress Notes (Signed)
PROGRESS NOTE    Alexandria [name]  YBW:[id number] DOB: [birth year] DOA: 11/23/2016 PCP: Nyoka Cowden, MD    Brief Narrative:  65 yo female with metastatic breast cancer, admitted to the intensive care on 11/23/16 due to shock associated to upper GI bleed, requiring invasive mechanical ventilation for airway protection. Frank hematemesis with abdominal pain. Endoscopy with ulcerated lesion at the GEJ, sp clips and epinephrine injection. Follow egd with no further bleeding. Liberated from mechanical ventilation on 03/15. Encephalopathy has been improving. Will need SNF. Resumed lactulose.    Assessment & Plan:   Active Problems:   HTN (hypertension), Chronic Diastolic HF, h/o HLD and CAD.    Acute upper GI bleed   Leukocytosis   Acute blood loss anemia, Coagulopathy & mild thrombocytopenia    Electrolyte imbalance   Acute hypoxemic respiratory failure ([place]) in setting of hemorrhagic shock. LLL atelectasis. Pulmonary edema.    Acute encephalopathy, both metabolic and hepatic    Abnormal liver enzymes. Had CT imaging c/w cirrhosis but Biopsy negative.    Abdominal pain    1. Upper GI bleed with acute blood loss anemia. No signs of bleeding, will continue close monitoring. Continue antiacid therapy. Diet as tolerated.   2. Metabolic encephalopathy.  Neuro check per unit protocol, patient very weak and deconditioned, will need snf at discharge. Continue as needed alprazolam and qhs lexapro. Poor oral intake will add IV fluids with dextrose at a low rate. Mild elevation of ammonia level, will continue on lactulose for now.   3. Chronic and stable diastolic heart failure. Continue blood pressure monitoring. Continue with coreg and lisinopril, clinically euvolemic will continue to hold on furosemide and metolazone. Chest film personally reviewed noted atelectasis on the right, will reduce IV fluids to prevent pulmonary edema.   4. Metastatic breast cancer.Outpatient. Follow oncology  recommendations.  5. HTN. Systolic blood pressure 768 to 150, tolerating well lisinopril and coreg.    DVT prophylaxis:scd Code Status:Partial (no cpr) Family Communication:I spoke with patient's family at the bedside and all questions were addressed,  Disposition Plan:Home    Consultants:  GI   Procedures: Upper endoscopy 03/8 and 03/10.   Antimicrobials:     Subjective: Positive pain at the right upper quadrant, moderate to severe, worse with movement, dull in nature, no radiation, no associated nausea or vomiting. Positive dyspnea.   Objective: Vitals:   12/04/16 0322 12/04/16 0400 12/04/16 0600 12/04/16 0801  BP:  (!) 116/42 (!) 117/47 (!) 150/84  Pulse:  73 73 85  Resp:  19 20 (!) 29  Temp: 98.4 F (36.9 C)     TempSrc: Oral     SpO2:  99% 98% 100%  Weight:      Height:        Intake/Output Summary (Last 24 hours) at 12/04/16 0845 Last data filed at 12/04/16 0800  Gross per 24 hour  Intake             1185 ml  Output              405 ml  Net              780 ml   Filed Weights   12/02/16 0400 12/03/16 0600 12/04/16 0257  Weight: 114 kg (251 lb 5.2 oz) 115 kg (253 lb 8.5 oz) 113.9 kg (251 lb 1.7 oz)    Examination:  General exam: deconditioned and in pain. Positive dyspnea. E ENT. Positive pallor and icterus, oral mucosa dry.  Respiratory system: expiratory  wheezing, no rales or rhonhi. Mild to moderate increased respiratory effort.  Cardiovascular system: S1 & S2 heard, RRR. No JVD, murmurs, rubs, gallops or clicks. Trace pedal edema. Gastrointestinal system: Abdomen is distended, soft and nontender. No organomegaly or masses felt. Normal bowel sounds heard. Central nervous system: Alert and oriented. No focal neurological deficits. Extremities: Symmetric 5 x 5 power. Skin: No rashes, lesions or ulcers     Data Reviewed: I have personally reviewed following labs and imaging studies  CBC:  Recent Labs Lab 11/30/16 2236  12/01/16 0520 12/02/16 0450 12/03/16 0456 12/04/16 0500  WBC 10.8* 12.2* 12.5* 12.1* 7.8  NEUTROABS 6.6 7.6 7.5 6.8 4.4  HGB 10.7* 9.8* 9.8* 9.3* 8.2*  HCT 31.0* 29.3* 29.0* 27.6* 24.7*  MCV 78.7 78.6 79.0 80.0 79.9  PLT 233 242 253 227 076   Basic Metabolic Panel:  Recent Labs Lab 11/30/16 0429 11/30/16 0430 12/01/16 0520 12/02/16 0450 12/03/16 0456 12/03/16 0500 12/04/16 0500  NA  --  138 137 138 140  --  142  K  --  3.2* 3.3* 3.7 3.5  --  3.1*  CL  --  107 108 108 109  --  115*  CO2  --  23 22 21* 23  --  23  GLUCOSE  --  123* 99 106* 98  --  116*  BUN  --  15 14 13 14   --  11  CREATININE  --  0.75 0.81 0.80 0.77  --  0.59  CALCIUM  --  8.0* 8.2* 8.4* 8.4*  --  8.1*  MG 1.9  --  1.9 1.8 1.9  --  1.9  PHOS  --  3.0 2.6 2.9  --  2.9 2.4*   GFR: Estimated Creatinine Clearance: 92.2 mL/min (by C-G formula based on SCr of 0.59 mg/dL). Liver Function Tests:  Recent Labs Lab 11/28/16 0404 11/29/16 0415 11/30/16 0430 12/01/16 0520 12/02/16 0450 12/03/16 0500 12/04/16 0500  AST 165* 155*  --   --   --   --   --   ALT 55* 56*  --   --   --   --   --   ALKPHOS 142* 133*  --   --   --   --   --   BILITOT 3.2* 3.8*  --   --   --   --   --   PROT 5.9* 6.0*  --   --   --   --   --   ALBUMIN 2.5* 2.3* 2.2* 2.4* 2.4* 2.3* 2.0*    Recent Labs Lab 11/30/16 2236  LIPASE 46  AMYLASE 52    Recent Labs Lab 11/27/16 1018 11/28/16 0404 12/03/16 1852 12/04/16 0500  AMMONIA 147* 99* 47* 54*   Coagulation Profile:  Recent Labs Lab 11/30/16 0429 12/01/16 0520 12/02/16 0450 12/03/16 0456 12/04/16 0500  INR 1.33 1.19 1.24 1.29 1.43   Cardiac Enzymes:  Recent Labs Lab 11/28/16 0835 11/28/16 1415 11/28/16 1957  TROPONINI 0.03* <0.03 <0.03   BNP (last 3 results) No results for input(s): PROBNP in the last 8760 hours. HbA1C: No results for input(s): HGBA1C in the last 72 hours. CBG:  Recent Labs Lab 12/01/16 0823  GLUCAP 87   Lipid Profile: No  results for input(s): CHOL, HDL, LDLCALC, TRIG, CHOLHDL, LDLDIRECT in the last 72 hours. Thyroid Function Tests: No results for input(s): TSH, T4TOTAL, FREET4, T3FREE, THYROIDAB in the last 72 hours. Anemia Panel: No results for input(s): VITAMINB12, FOLATE, FERRITIN, TIBC, IRON,  RETICCTPCT in the last 72 hours. Sepsis Labs: No results for input(s): PROCALCITON, LATICACIDVEN in the last 168 hours.  No results found for this or any previous visit (from the past 240 hour(s)).       Radiology Studies: No results found.      Scheduled Meds: . carvedilol  3.125 mg Oral BID WC  . Chlorhexidine Gluconate Cloth  6 each Topical Q0600  . escitalopram  10 mg Oral QHS  . fentaNYL (SUBLIMAZE) injection  50 mcg Intravenous Once  . lactulose  30 g Oral TID  . lisinopril  5 mg Oral Daily  . mouth rinse  15 mL Mouth Rinse BID  . pantoprazole  40 mg Oral BID  . zolpidem  5 mg Oral Once   Continuous Infusions: . dextrose 5% lactated ringers 50 mL/hr at 12/04/16 0800     LOS: 11 days        Berea Majkowski Gerome Apley, MD [facility] Pager [phone]  If 7PM-7AM, please contact night-coverage [email or web address] Password [email or web address] 12/04/2016, 8:45 AM

## 2016-12-05 LAB — RENAL FUNCTION PANEL
ALBUMIN: 1.9 g/dL — AB (ref 3.5–5.0)
ANION GAP: 7 (ref 5–15)
BUN: 10 mg/dL (ref 6–20)
CALCIUM: 8.1 mg/dL — AB (ref 8.9–10.3)
CO2: 22 mmol/L (ref 22–32)
Chloride: 113 mmol/L — ABNORMAL HIGH (ref 101–111)
Creatinine, Ser: 0.67 mg/dL (ref 0.44–1.00)
GFR calc Af Amer: >60 mL/min (ref >60)
Glucose, Bld: 113 mg/dL — ABNORMAL HIGH (ref 65–99)
PHOSPHORUS: 2.4 mg/dL — AB (ref 2.5–4.6)
POTASSIUM: 3 mmol/L — AB (ref 3.5–5.1)
Sodium: 142 mmol/L (ref 135–145)

## 2016-12-05 LAB — PROTIME-INR
INR: 1.28
Prothrombin Time: 16 seconds — ABNORMAL HIGH (ref 11.4–15.2)

## 2016-12-05 LAB — APTT: aPTT: 40 seconds — ABNORMAL HIGH (ref 24–36)

## 2016-12-05 LAB — AMMONIA: Ammonia: 58 umol/L — ABNORMAL HIGH (ref 9–35)

## 2016-12-05 LAB — MAGNESIUM: Magnesium: 1.9 mg/dL (ref 1.7–2.4)

## 2016-12-05 MED ORDER — BOOST / RESOURCE BREEZE PO LIQD
1.0000 | Freq: Three times a day (TID) | ORAL | Status: DC
  Administered 2016-12-05: 1 via ORAL

## 2016-12-05 MED ORDER — ADULT MULTIVITAMIN W/MINERALS CH
1.0000 | ORAL_TABLET | Freq: Every day | ORAL | Status: DC
  Administered 2016-12-05 – 2016-12-11 (×7): 1 via ORAL
  Filled 2016-12-05 (×7): qty 1

## 2016-12-05 MED ORDER — MORPHINE SULFATE (PF) 4 MG/ML IV SOLN
1.0000 mg | INTRAVENOUS | Status: DC | PRN
Start: 2016-12-05 — End: 2016-12-11
  Administered 2016-12-06 – 2016-12-10 (×15): 1 mg via INTRAVENOUS
  Filled 2016-12-05 (×15): qty 1

## 2016-12-05 MED ORDER — POTASSIUM CHLORIDE CRYS ER 20 MEQ PO TBCR
40.0000 meq | EXTENDED_RELEASE_TABLET | ORAL | Status: AC
  Administered 2016-12-05 (×2): 40 meq via ORAL
  Filled 2016-12-05 (×2): qty 2

## 2016-12-05 MED ORDER — SODIUM CHLORIDE 0.9 % IV BOLUS (SEPSIS)
250.0000 mL | Freq: Once | INTRAVENOUS | Status: AC
  Administered 2016-12-05: 250 mL via INTRAVENOUS

## 2016-12-05 MED ORDER — NYSTATIN 100000 UNIT/GM EX CREA
TOPICAL_CREAM | Freq: Two times a day (BID) | CUTANEOUS | Status: DC
  Administered 2016-12-05 – 2016-12-11 (×13): via TOPICAL
  Filled 2016-12-05 (×3): qty 15

## 2016-12-05 NOTE — Clinical Social Work Placement (Signed)
   CLINICAL SOCIAL WORK PLACEMENT  NOTE  Date:  12/05/2016  Patient Details  Name: Alexandria [name] MRN: [id number] Date of Birth: Apr 11, 1951  Clinical Social Work is seeking post-discharge placement for this patient at the [place] level of care (*CSW will initial, date and re-position this form in  chart as items are completed):  Yes   Patient/family provided with [facility] Work Department's list of facilities offering this level of care within the geographic area requested by the patient (or if unable, by the patient's family).  Yes   Patient/family informed of their freedom to choose among providers that offer the needed level of care, that participate in Medicare, Medicaid or managed care program needed by the patient, have an available bed and are willing to accept the patient.  Yes   Patient/family informed of [facility]'s ownership interest in [facility] and [facility], as well as of the fact that they are under no obligation to receive care at these facilities.  PASRR submitted to EDS on 12/04/16     PASRR number received on 12/04/16     Existing PASRR number confirmed on       FL2 transmitted to all facilities in geographic area requested by pt/family on 12/04/16     FL2 transmitted to all facilities within larger geographic area on       Patient informed that his/her managed care company has contracts with or will negotiate with certain facilities, including the following:            Patient/family informed of bed offers received.  Patient chooses bed at       Physician recommends and patient chooses bed at      Patient to be transferred to   on  .  Patient to be transferred to facility by       Patient family notified on   of transfer.  Name of family member notified:        PHYSICIAN       Additional Comment:    _______________________________________________ Luretha Rued, [place] 12/05/2016, 1:32  PM

## 2016-12-05 NOTE — Clinical Social Work Note (Signed)
Clinical Social Work Assessment  Patient Details  Name: Alexandria [name] MRN: [id number] Date of Birth: 1951/02/10  Date of referral:  12/05/16               Reason for consult:  Discharge Planning                Permission sought to share information with:  Chartered certified accountant granted to share information::  Yes, Verbal Permission Granted  Name::        Agency::     Relationship::     Contact Information:     Housing/Transportation Living arrangements for the past 2 months:  Apartment Source of Information:  Patient, Adult Children Patient Interpreter Needed:  None Criminal Activity/Legal Involvement Pertinent to Current Situation/Hospitalization:  No - Comment as needed Significant Relationships:  Adult Children, Spouse Lives with:  Spouse Do you feel safe going back to the place where you live?  No (SNF recommended) Need for family participation in patient care:  Yes (Comment)  Care giving concerns:  Pt's care cannot be managed at home following hospital d/c.   Social Worker assessment / plan:  Pt hospitalized from home on 11/23/16 with Acute Respiratory Failure in setting of Hemorrhagic Shock. PT has recommended SNF at d/c. CSW met with pt / son Legrand Como to assist with d/c planning. Pt / family are in agreement with plan for SNF. SNF search has been initiated and bed offers are pending. CSW will continue to follow to assist with d/c planning needs.  Employment status:  Disabled (Comment on whether or not currently receiving Disability) Insurance information:  Managed Medicare PT Recommendations:  [place] / Referral to community resources:  [place]  Patient/Family's Response to care: Pt / family agree with plan for SNF.  Patient/Family's Understanding of and Emotional Response to Diagnosis, Current Treatment, and Prognosis:  Pt / family are aware of pt's medical status. Pt reports that spouse is having difficulty  with accepting pt's dx and has not been visiting. Son reports that he is planning to have pt move to [facility] to be closer to him following d/c from SNF. Support / reassurance provided.  Emotional Assessment Appearance:  Appears stated age Attitude/Demeanor/Rapport:  Other (cooperative) Affect (typically observed):  Pleasant, Appropriate Orientation:  Oriented to Self, Oriented to Place, Oriented to  Time, Oriented to Situation Alcohol / Substance use:  Not Applicable Psych involvement (Current and /or in the community):  No (Comment)  Discharge Needs  Concerns to be addressed:  Discharge Planning Concerns Readmission within the last 30 days:  No Current discharge risk:  None Barriers to Discharge:  No Barriers Identified   Ashonte Angelucci, [place] An, LCSW 12/05/2016, 1:25 PM

## 2016-12-05 NOTE — Progress Notes (Addendum)
Nutrition Follow-up  DOCUMENTATION CODES:   Morbid obesity  INTERVENTION:  - Will order Boost Breeze TID, each supplement provides 250 kcal and 9 grams of protein - Will order Magic Cup TID with meals, each supplement provides 290 kcal and 9 grams of protein - Continue to encourage PO intakes.  - Will order daily multivitamin with minerals.  - RD will continue to monitor for needs.  Monitor magnesium, potassium, and phosphorus daily for at least 3 days, MD to replete as needed, as pt is at risk for refeeding syndrome given poor PO intakes, initiation of D5, current hypophosphatemia and hypokalemia.   NUTRITION DIAGNOSIS:   Inadequate oral intake related to acute illness, poor appetite as evidenced by per patient/family report, meal completion < 50%. 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  The patient reported the bleeding began on 3/7, was bright red and was associated with abdominal pain.  She was recently admitted from 2/16-2/24 for Nissen fundoplication in setting of hiatal hernia.  During the surgery, her liver was noted to have an abnormal appearance prompting biopsy which was positive for metastatic breast cancer. The patient was scheduled to have a PET scan 2/28 which showed cirrhotic / hypermetabolic liver consistent with metastatic disease.  She was unable to make her ONC follow up.  3/20 Per chart review, pt consumed 75% of CLD for dinner on 3/15 and no other PO intakes documented since that time. Notes indicate pt has had a poor appetite and eating poorly. Pt's son was visiting earlier in the day but no family/visitors present at this time and pt resting. Spoke with RN who reports that pt likes sweet items and has mainly been  consuming [place] ice and ice cream. RN ordered salad and hamburger for pt for lunch (pt requested these items) but has since been too tired to eat; RN plans to wake pt shortly and encourage at least a few bites. RN reports that pt has a raw bottom d/t lactulose and that she feels pt may have a fear of this rawness worsening if she eats more.   Weight stable 3/16-3/19 and then +2.4 kg from yesterday; will continue to monitor weight trends. Spoke with RN about oral nutrition supplements and will order as outlined above.   Medications reviewed; 30 mg lactulose TID, 40 mg oral Protonix BID. Labs reviewed; K: 3 mmol/L, Phos: 2.4 mg/dL, Cl: 113 mmol/L, Ca: 8.1 mg/dL, Ammonia: 58 umol/L.  IVF: D5-LR @ 75 mL/hr (306 kcal).     3/15 - Pt extubated ~0815 this AM. - Diet remains NPO; will monitor for diet advancement and associated needs.  - Weight has been mainly stable throughout admission. -  Pt has not had any nutrition x7 days and had poor appetite and intakes for several weeks PTA.   IVF: D5 @ 30 mL/hr (122 kcal).    3/13 - Pt remains intubated and no NGT or OGT in place at this time.  - Pt with breast cancer with liver mets and Dr. Ernestina Penna note 3/9 PM states no plan for treatment at this time.  - Repeat upper EGD done 3/10 showing large esophageal ulceration with no active bleeding and a medium-sized hiatal hernia.  - Per PCCM notes, plan  is to continue to hold off on TF at this time with hope of extubation in 24-48 hours; confirmed this with Dr. Ashok Cordia who reports plan for extubation as long as agitation is reduced/controlled.  - Noted that ammonia is trending down but remains elevated. - Weight today is -1 kg from previous assessment.  - TF recommendations outlined above should pt be unable to be extubated: If pt unable to be d/c as hoped, recommend NGT or OGT placement with Vital High Protein @ 20 mL/hr to advance by 10 mL every 8 hours to reach Vital High Protein @ 60 mL/hr.At goal  rate, this regimen will provide 1440 kcal, 126 grams of protein, and 1204 mL free water..   Patient is currently intubated on ventilator support MV: 9L/min Temp (24hrs), Avg:97.9 F (36.6 C), Min:97.1 F (36.2 C), Max:98.6 F (37 C)  BP: 111/64 and MAP: 79 IVF: D5 @ 30 mL/hr (122 kcal) Drip: Precedex @ 0.8 mcg/kg/hr.    Diet Order:  Diet Heart Room service appropriate? Yes; Fluid consistency: Thin  Skin:  Reviewed, no issues  Last BM:  3/19  Height:   Ht Readings from Last 1 Encounters:  11/28/16 5\' 6"  (1.676 m)    Weight:   Wt Readings from Last 1 Encounters:  12/05/16 256 lb 6.3 oz (116.3 kg)    Ideal Body Weight:  59.09 kg  BMI:  Body mass index is 41.38 kg/m.  Estimated Nutritional Needs:   Kcal:  6720-9470 (19-21 kcal/kg)  Protein:  115-125 grams   Fluid:  >/= 1.5 L/day  EDUCATION NEEDS:   No education needs identified at this time    Jarome Matin, MS, RD, LDN, CNSC Inpatient Clinical Dietitian Pager # [phone] After hours/weekend pager # [phone]

## 2016-12-05 NOTE — Progress Notes (Addendum)
PROGRESS NOTE    Alexandria [name]  WJX:[id number] DOB: 1951-03-06 DOA: 11/23/2016 PCP: Nyoka Cowden, MD     Brief Narrative:  66 yo female with metastatic breast cancer, admitted to the intensive care on 11/23/16 due to shock associated to upper GI bleed, requiring invasive mechanical ventilation for airway protection. Frank hematemesis with abdominal pain. Endoscopy with ulcerated lesion at the GEJ, sp clips and epinephrine injection. Follow egd with no further bleeding. Liberated from mechanical ventilation on 03/15. Encephalopathy has been improving. Will need SNF. Resumed lactulose with improved mentation.     Assessment & Plan:   Active Problems:   HTN (hypertension), Chronic Diastolic HF, h/o HLD and CAD.    Acute upper GI bleed   Leukocytosis   Acute blood loss anemia, Coagulopathy & mild thrombocytopenia    Electrolyte imbalance   Acute hypoxemic respiratory failure ([place]) in setting of hemorrhagic shock. LLL atelectasis. Pulmonary edema.    Acute encephalopathy, both metabolic and hepatic    Abnormal liver enzymes. Had CT imaging c/w cirrhosis but Biopsy negative.    Abdominal pain   1. Upper GI bleed with acute blood loss anemia. Continue antiacid therapy with bid pantoprazole. Diet as tolerated. Hb and hct have remained stable.   2. Metabolic encephalopathy/ hepatic porto-systemic encephalopathy. Continue neuro check per unit protocol, Mentation has improved, will continue with tid lactulosa, ammonia level in the 50's.  Continue as needed alprazolam and qhslexapro. Continue dextrose IV. Will plan to transfer to medical unit in am. 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   3. Chronic and stable diastolic heart failure. Tolerating wellcoreg, holding on  Lisinopril when systolic less than 213, clinically continue to hold on furosemide and metolazone.  4. Metastatic breast cancer.Outpatient. Follow oncology recommendations. Patient  will need snf at discharge.   5. HTN. Will place holding parameters to lisinopril, continue coreg, tachycardia has improved.     DVT prophylaxis:scd Code Status:Partial (no cpr) Family Communication:I spoke with patient's family at the bedside and all questions were addressed,  Disposition Plan:Home    Consultants:  GI   Procedures: Upper endoscopy 03/8 and 03/10.   Antimicrobials:    Subjective: Patient more awake and alert, no nausea or vomiting, decreased urine output. No dyspnea or chest pain.   Objective: Vitals:   12/05/16 0406 12/05/16 0436 12/05/16 0600 12/05/16 0800  BP:   117/65 (!) 103/57  Pulse:   79 82  Resp:   16 18  Temp: 98.8 F (37.1 C)     TempSrc: Axillary     SpO2:   92% 99%  Weight:  116.3 kg (256 lb 6.3 oz)    Height:        Intake/Output Summary (Last 24 hours) at 12/05/16 0901 Last data filed at 12/05/16 0800  Gross per 24 hour  Intake          1724.58 ml  Output              270 ml  Net          1454.58 ml   Filed Weights   12/03/16 0600 12/04/16 0257 12/05/16 0436  Weight: 115 kg (253 lb 8.5 oz) 113.9 kg (251 lb 1.7 oz) 116.3 kg (256 lb 6.3 oz)    Examination:  General exam: deconditioned, E ENT: mild pallor, oral mucosa dry.  Respiratory system: Clear to auscultation. Respiratory effort normal. No wheezing or rales.  Cardiovascular system: S1 & S2 heard, RRR. No JVD, murmurs, rubs,  gallops or clicks. Non pittingl edema. Gastrointestinal system: Abdomen is nondistended, soft and nontender. No organomegaly or masses felt. Normal bowel sounds heard. Central nervous system: Alert and oriented. No focal neurological deficits. Extremities: Symmetric 5 x 5 power. Skin: Rash in the lower abdominal skin fold.     Data Reviewed: I have personally reviewed following labs and imaging studies  CBC:  Recent Labs Lab 11/30/16 2236 12/01/16 0520 12/02/16 0450 12/03/16 0456 12/04/16 0500  WBC 10.8* 12.2* 12.5* 12.1*  7.8  NEUTROABS 6.6 7.6 7.5 6.8 4.4  HGB 10.7* 9.8* 9.8* 9.3* 8.2*  HCT 31.0* 29.3* 29.0* 27.6* 24.7*  MCV 78.7 78.6 79.0 80.0 79.9  PLT 233 242 253 227 878   Basic Metabolic Panel:  Recent Labs Lab 12/01/16 0520 12/02/16 0450 12/03/16 0456 12/03/16 0500 12/04/16 0500 12/05/16 0432  NA 137 138 140  --  142 142  K 3.3* 3.7 3.5  --  3.1* 3.0*  CL 108 108 109  --  115* 113*  CO2 22 21* 23  --  23 22  GLUCOSE 99 106* 98  --  116* 113*  BUN 14 13 14   --  11 10  CREATININE 0.81 0.80 0.77  --  0.59 0.67  CALCIUM 8.2* 8.4* 8.4*  --  8.1* 8.1*  MG 1.9 1.8 1.9  --  1.9 1.9  PHOS 2.6 2.9  --  2.9 2.4* 2.4*   GFR: Estimated Creatinine Clearance: 93.3 mL/min (by C-G formula based on SCr of 0.67 mg/dL). Liver Function Tests:  Recent Labs Lab 11/29/16 0415  12/01/16 0520 12/02/16 0450 12/03/16 0500 12/04/16 0500 12/05/16 0432  AST 155*  --   --   --   --   --   --   ALT 56*  --   --   --   --   --   --   ALKPHOS 133*  --   --   --   --   --   --   BILITOT 3.8*  --   --   --   --   --   --   PROT 6.0*  --   --   --   --   --   --   ALBUMIN 2.3*  < > 2.4* 2.4* 2.3* 2.0* 1.9*  < > = values in this interval not displayed.  Recent Labs Lab 11/30/16 2236  LIPASE 46  AMYLASE 52    Recent Labs Lab 12/03/16 1852 12/04/16 0500 12/05/16 0432  AMMONIA 47* 54* 58*   Coagulation Profile:  Recent Labs Lab 12/01/16 0520 12/02/16 0450 12/03/16 0456 12/04/16 0500 12/05/16 0432  INR 1.19 1.24 1.29 1.43 1.28   Cardiac Enzymes:  Recent Labs Lab 11/28/16 1415 11/28/16 1957  TROPONINI <0.03 <0.03   BNP (last 3 results) No results for input(s): PROBNP in the last 8760 hours. HbA1C: No results for input(s): HGBA1C in the last 72 hours. CBG:  Recent Labs Lab 12/01/16 0823 12/03/16 1710  GLUCAP 87 120*   Lipid Profile: No results for input(s): CHOL, HDL, LDLCALC, TRIG, CHOLHDL, LDLDIRECT in the last 72 hours. Thyroid Function Tests: No results for input(s): TSH,  T4TOTAL, FREET4, T3FREE, THYROIDAB in the last 72 hours. Anemia Panel: No results for input(s): VITAMINB12, FOLATE, FERRITIN, TIBC, IRON, RETICCTPCT in the last 72 hours. Sepsis Labs: No results for input(s): PROCALCITON, LATICACIDVEN in the last 168 hours.  No results found for this or any previous visit (from the past 240 hour(s)).  Radiology Studies: Dg Chest 1 View  Result Date: 12/04/2016 CLINICAL DATA:  Dyspnea. EXAM: CHEST 1 VIEW COMPARISON:  11/28/2016 and 11/27/2016 FINDINGS: Endotracheal tube and central line at been removed. Heart size and pulmonary vascularity are now normal. Slight residual atelectasis of both lung bases. Chronic elevation of the right hemidiaphragm. IMPRESSION: Slight residual atelectasis at the lung bases. Electronically Signed   By: Lorriane Shire M.D.   On: 12/04/2016 09:53        Scheduled Meds: . carvedilol  3.125 mg Oral BID WC  . Chlorhexidine Gluconate Cloth  6 each Topical Q0600  . escitalopram  10 mg Oral QHS  . fentaNYL (SUBLIMAZE) injection  50 mcg Intravenous Once  . lactulose  30 g Oral TID  . lisinopril  5 mg Oral Daily  . mouth rinse  15 mL Mouth Rinse BID  . pantoprazole  40 mg Oral BID  . zolpidem  5 mg Oral Once   Continuous Infusions: . dextrose 5% lactated ringers 75 mL/hr at 12/05/16 0839     LOS: 12 days       Veronda Gabor Gerome Apley, MD [facility] Pager [phone]  If 7PM-7AM, please contact night-coverage [email or web address] Password [email or web address] 12/05/2016, 9:01 AM

## 2016-12-05 NOTE — Progress Notes (Signed)
Physical Therapy Treatment Patient Details Name: Alexandria [name] MRN: [id number] DOB: 04-30-1951 Today's Date: 12/05/2016    History of Present Illness Pt admitted with N/V, abd pain, hematemesis 2* upper GIB, and acute encephalopathy.  Pt admitted to ICU and placed on vent - extubated 11/30/16/  Pt with hx of metastatic breast CA, lymphadema, fibromyalgia, DDD, and chronic back pain.    PT Comments    The patient did stand and take small pivot  steps to  To recliner. Sat early so assisted to  Straighten in the recliner. Continue PT.  Follow Up Recommendations  SNF;Supervision/Assistance - 24 hour     Equipment Recommendations  None recommended by PT    Recommendations for Other Services OT consult     Precautions / Restrictions      Mobility  Bed Mobility Overal bed mobility: Needs Assistance Bed Mobility: Rolling;Sidelying to Sit Rolling: Mod assist;+2 for safety/equipment Sidelying to sit: Mod assist;HOB elevated;+2 for safety/equipment       General bed mobility comments: cues for sequence and log roll technique with rail to left and rt.  Transfers Overall transfer level: Needs assistance Equipment used: Standard walker Transfers: Sit to/from [employer] Transfers Sit to Stand: Mod assist;+2 safety/equipment;From elevated surface Stand pivot transfers: Mod assist;+2 safety/equipment       General transfer comment: Increased time and cues for use of UEs to self assist.  very slow to take a few steps to recliner. Sat quickly before getting completely turned around in front of the recliner.  Ambulation/Gait                 Stairs            Wheelchair Mobility    Modified Rankin (Stroke Patients Only)       Balance                                    Cognition Arousal/Alertness: Awake/alert Behavior During Therapy: Restless;WFL for tasks assessed/performed Overall Cognitive Status: Within Functional Limits for tasks  assessed                      Exercises      General Comments        Pertinent Vitals/Pain Pain Assessment: Faces Faces Pain Scale: Hurts whole lot Pain Location: abdomen, catheter Pain Descriptors / Indicators: Crying;Discomfort;Grimacing;Guarding;Restless;Moaning Pain Intervention(s): Repositioned;Monitored during session;Limited activity within patient's tolerance    Home Living                      Prior Function            PT Goals (current goals can now be found in the care plan section) Progress towards PT goals: Progressing toward goals    Frequency    Min 3X/week      PT Plan Current plan remains appropriate    Co-evaluation             End of Session Equipment Utilized During Treatment: Gait belt Activity Tolerance: Patient limited by fatigue;Patient limited by pain Patient left: in chair;with call bell/phone within reach;with chair alarm set Nurse Communication: Mobility status;Need for lift equipment (if is too weak to stand) PT Visit Diagnosis: Unsteadiness on feet (R26.81);Muscle weakness (generalized) (M62.81);Difficulty in walking, not elsewhere classified (R26.2)     Time: 6440-3474 PT Time Calculation (min) (ACUTE ONLY): 43 min  Charges:  $Therapeutic Activity:  38-52 mins                    G Codes:       Alexandria [name] 12/05/2016, 12:58 PM Alexandria [name] PT [phone]

## 2016-12-06 ENCOUNTER — Ambulatory Visit: Payer: Medicare Other | Admitting: Podiatry

## 2016-12-06 ENCOUNTER — Other Ambulatory Visit: Payer: Self-pay | Admitting: Hematology & Oncology

## 2016-12-06 DIAGNOSIS — I5043 Acute on chronic combined systolic (congestive) and diastolic (congestive) heart failure: Secondary | ICD-10-CM

## 2016-12-06 DIAGNOSIS — R4182 Altered mental status, unspecified: Secondary | ICD-10-CM

## 2016-12-06 LAB — RENAL FUNCTION PANEL
ANION GAP: 7 (ref 5–15)
Albumin: 1.9 g/dL — ABNORMAL LOW (ref 3.5–5.0)
BUN: 8 mg/dL (ref 6–20)
CALCIUM: 8.3 mg/dL — AB (ref 8.9–10.3)
CO2: 20 mmol/L — AB (ref 22–32)
Chloride: 114 mmol/L — ABNORMAL HIGH (ref 101–111)
Creatinine, Ser: 0.72 mg/dL (ref 0.44–1.00)
GFR calc non Af Amer: >60 mL/min (ref >60)
Glucose, Bld: 117 mg/dL — ABNORMAL HIGH (ref 65–99)
PHOSPHORUS: 2.4 mg/dL — AB (ref 2.5–4.6)
Potassium: 3.6 mmol/L (ref 3.5–5.1)
SODIUM: 141 mmol/L (ref 135–145)

## 2016-12-06 LAB — URINALYSIS, ROUTINE W REFLEX MICROSCOPIC
Bilirubin Urine: NEGATIVE
Glucose, UA: NEGATIVE mg/dL
Ketones, ur: NEGATIVE mg/dL
NITRITE: NEGATIVE
PH: 5 (ref 5.0–8.0)
Protein, ur: NEGATIVE mg/dL
SPECIFIC GRAVITY, URINE: 1.006 (ref 1.005–1.030)

## 2016-12-06 LAB — AMMONIA: AMMONIA: 46 umol/L — AB (ref 9–35)

## 2016-12-06 LAB — CBC WITH DIFFERENTIAL/PLATELET
BASOS ABS: 0.1 10*3/uL (ref 0.0–0.1)
BASOS PCT: 1 %
EOS ABS: 0.3 10*3/uL (ref 0.0–0.7)
Eosinophils Relative: 3 %
HEMATOCRIT: 27 % — AB (ref 36.0–46.0)
Hemoglobin: 9.1 g/dL — ABNORMAL LOW (ref 12.0–15.0)
Lymphocytes Relative: 22 %
Lymphs Abs: 1.9 10*3/uL (ref 0.7–4.0)
MCH: 26.8 pg (ref 26.0–34.0)
MCHC: 33.7 g/dL (ref 30.0–36.0)
MCV: 79.4 fL (ref 78.0–100.0)
MONOS PCT: 20 %
Monocytes Absolute: 1.8 10*3/uL — ABNORMAL HIGH (ref 0.1–1.0)
NEUTROS ABS: 4.8 10*3/uL (ref 1.7–7.7)
NEUTROS PCT: 54 %
Platelets: 174 10*3/uL (ref 150–400)
RBC: 3.4 MIL/uL — AB (ref 3.87–5.11)
RDW: 17.9 % — ABNORMAL HIGH (ref 11.5–15.5)
WBC: 8.8 10*3/uL (ref 4.0–10.5)

## 2016-12-06 LAB — APTT: aPTT: 38 seconds — ABNORMAL HIGH (ref 24–36)

## 2016-12-06 LAB — PROTIME-INR
INR: 1.33
Prothrombin Time: 16.6 seconds — ABNORMAL HIGH (ref 11.4–15.2)

## 2016-12-06 LAB — MAGNESIUM: MAGNESIUM: 1.7 mg/dL (ref 1.7–2.4)

## 2016-12-06 MED ORDER — ABEMACICLIB 100 MG PO TABS: 100.0000 mg | ORAL_TABLET | Freq: Every day | ORAL | 4 refills | Status: DC

## 2016-12-06 MED ORDER — FERUMOXYTOL INJECTION 510 MG/17 ML
510.0000 mg | Freq: Once | INTRAVENOUS | Status: AC
  Administered 2016-12-07: 510 mg via INTRAVENOUS
  Filled 2016-12-06: qty 17

## 2016-12-06 MED ORDER — SODIUM CHLORIDE 0.9 % IV SOLN
40.0000 mg | Freq: Once | INTRAVENOUS | Status: DC
  Filled 2016-12-06: qty 4

## 2016-12-06 MED ORDER — POTASSIUM CHLORIDE 20 MEQ/15ML (10%) PO SOLN
40.0000 meq | Freq: Once | ORAL | Status: AC
  Administered 2016-12-06: 40 meq via ORAL
  Filled 2016-12-06: qty 30

## 2016-12-06 MED ORDER — SODIUM CHLORIDE 0.9 % IV BOLUS (SEPSIS)
250.0000 mL | Freq: Once | INTRAVENOUS | Status: AC
  Administered 2016-12-06: 250 mL via INTRAVENOUS

## 2016-12-06 MED ORDER — SODIUM CHLORIDE 0.9 % IV SOLN
40.0000 mg | Freq: Once | INTRAVENOUS | Status: AC
  Administered 2016-12-07: 40 mg via INTRAVENOUS
  Filled 2016-12-06: qty 4

## 2016-12-06 MED ORDER — AMOXICILLIN-POT CLAVULANATE 875-125 MG PO TABS
1.0000 | ORAL_TABLET | Freq: Two times a day (BID) | ORAL | Status: DC
  Administered 2016-12-06 – 2016-12-08 (×4): 1 via ORAL
  Filled 2016-12-06 (×4): qty 1

## 2016-12-06 MED ORDER — FUROSEMIDE 10 MG/ML IJ SOLN
40.0000 mg | Freq: Once | INTRAMUSCULAR | Status: AC
  Administered 2016-12-06: 40 mg via INTRAVENOUS
  Filled 2016-12-06: qty 4

## 2016-12-06 MED ORDER — ABEMACICLIB 100 MG PO TABS: 100.0000 mg | ORAL_TABLET | Freq: Two times a day (BID) | ORAL | 4 refills | Status: DC

## 2016-12-06 NOTE — Progress Notes (Signed)
Physical Therapy Treatment Patient Details Name: Alexandria [name] MRN: [id number] DOB: 1951-09-18 Today's Date: 12/06/2016    History of Present Illness Pt admitted with N/V, abd pain, hematemesis 2* upper GIB, and acute encephalopathy.  Pt admitted to ICU and placed on vent - extubated 11/30/16/  Pt with hx of metastatic breast CA, lymphadema, fibromyalgia, DDD, and chronic back pain.    PT Comments    Pt cooperative but requiring increased time for all activities and progressing slowly with mobility.  Follow Up Recommendations  SNF;Supervision/Assistance - 24 hour     Equipment Recommendations  None recommended by PT    Recommendations for Other Services OT consult     Precautions / Restrictions Precautions Precautions: Fall Restrictions Weight Bearing Restrictions: No    Mobility  Bed Mobility Overal bed mobility: Needs Assistance Bed Mobility: Rolling;Sidelying to Sit Rolling: Min assist;Mod assist Sidelying to sit: Min assist;Mod assist       General bed mobility comments: cues for sequence and log roll technique  Transfers Overall transfer level: Needs assistance Equipment used: Rolling walker (2 wheeled) Transfers: Sit to/from Stand Sit to Stand: Min assist;Mod assist;+2 physical assistance;+2 safety/equipment;From elevated surface         General transfer comment: Increased time and cues for use of UEs to self assist.  Ambulation/Gait Ambulation/Gait assistance: Min assist;Mod assist;+2 physical assistance;+2 safety/equipment Ambulation Distance (Feet): 6 Feet Assistive device: Rolling walker (2 wheeled) Gait Pattern/deviations: Step-to pattern;Decreased step length - right;Decreased step length - left;Shuffle;Trunk flexed Gait velocity: decr Gait velocity interpretation: Below normal speed for age/gender General Gait Details: cues for posture and position from [employer]            Wheelchair Mobility    Modified Rankin (Stroke Patients  Only)       Balance Overall balance assessment: Needs assistance Sitting-balance support: No upper extremity supported;Feet supported Sitting balance-Leahy Scale: Good     Standing balance support: Bilateral upper extremity supported Standing balance-Leahy Scale: Poor                      Cognition Arousal/Alertness: Awake/alert Behavior During Therapy: WFL for tasks assessed/performed Overall Cognitive Status: Within Functional Limits for tasks assessed                      Exercises      General Comments        Pertinent Vitals/Pain Pain Assessment: Faces Faces Pain Scale: Hurts little more Pain Location: abdomen Pain Descriptors / Indicators: Guarding;Sore Pain Intervention(s): Limited activity within patient's tolerance;Monitored during session;Premedicated before session    Home Living                      Prior Function            PT Goals (current goals can now be found in the care plan section) Acute Rehab PT Goals Patient Stated Goal: Regain strength PT Goal Formulation: With patient Time For Goal Achievement: 12/16/16 Potential to Achieve Goals: Fair Progress towards PT goals: Progressing toward goals    Frequency    Min 3X/week      PT Plan Current plan remains appropriate    Co-evaluation             End of Session Equipment Utilized During Treatment: Gait belt Activity Tolerance: Patient limited by fatigue Patient left: in chair;with call bell/phone within reach;with chair alarm set Nurse Communication: Mobility status PT Visit Diagnosis: Unsteadiness  on feet (R26.81);Muscle weakness (generalized) (M62.81);Difficulty in walking, not elsewhere classified (R26.2)     Time: 9675-9163 PT Time Calculation (min) (ACUTE ONLY): 24 min  Charges:  $Gait Training: 8-22 mins $Therapeutic Activity: 8-22 mins                    G Codes:       Rickeya Manus Dec 12, 2016, 5:18 PM

## 2016-12-06 NOTE — Progress Notes (Signed)
Physical Therapy Treatment Patient Details Name: Alexandria [name] MRN: [id number] DOB: [birth year] Today's Date: 12/06/2016    History of Present Illness Pt admitted with N/V, abd pain, hematemesis 2* upper GIB, and acute encephalopathy.  Pt admitted to ICU and placed on vent - extubated 11/30/16/  Pt with hx of metastatic breast CA, lymphadema, fibromyalgia, DDD, and chronic back pain.    PT Comments       Follow Up Recommendations  SNF;Supervision/Assistance - 24 hour     Equipment Recommendations  None recommended by PT    Recommendations for Other Services OT consult     Precautions / Restrictions Precautions Precautions: Fall Restrictions Weight Bearing Restrictions: No    Mobility  Bed Mobility Overal bed mobility: Needs Assistance Bed Mobility: Sit to Supine Rolling: Min assist;Mod assist Sidelying to sit: Min assist;Mod assist   Sit to supine: Min assist;+2 for physical assistance;+2 for safety/equipment   General bed mobility comments: cues for sequence and min assist for LE management  Transfers Overall transfer level: Needs assistance Equipment used: Rolling walker (2 wheeled) Transfers: Sit to/from Stand Sit to Stand: Min assist;Mod assist;+2 physical assistance;+2 safety/equipment         General transfer comment: Increased time and cues for use of UEs to self assist.  Ambulation/Gait Ambulation/Gait assistance: Min assist;Mod assist;+2 physical assistance;+2 safety/equipment Ambulation Distance (Feet): 4 Feet Assistive device: Rolling walker (2 wheeled) Gait Pattern/deviations: Step-to pattern;Decreased step length - right;Decreased step length - left;Shuffle;Trunk flexed Gait velocity: decr Gait velocity interpretation: Below normal speed for age/gender General Gait Details: cues for posture and position from [employer]            Wheelchair Mobility    Modified Rankin (Stroke Patients Only)       Balance Overall balance  assessment: Needs assistance Sitting-balance support: No upper extremity supported;Feet supported Sitting balance-Leahy Scale: Good     Standing balance support: Bilateral upper extremity supported Standing balance-Leahy Scale: Poor                      Cognition Arousal/Alertness: Awake/alert Behavior During Therapy: WFL for tasks assessed/performed Overall Cognitive Status: Within Functional Limits for tasks assessed                      Exercises      General Comments        Pertinent Vitals/Pain Pain Assessment: Faces Faces Pain Scale: Hurts little more Pain Location: abdomen Pain Descriptors / Indicators: Guarding;Sore Pain Intervention(s): Limited activity within patient's tolerance;Monitored during session;Premedicated before session    Home Living                      Prior Function            PT Goals (current goals can now be found in the care plan section) Acute Rehab PT Goals Patient Stated Goal: Regain strength PT Goal Formulation: With patient Time For Goal Achievement: 12/16/16 Potential to Achieve Goals: Fair Progress towards PT goals: Progressing toward goals    Frequency    Min 3X/week      PT Plan Current plan remains appropriate    Co-evaluation             End of Session Equipment Utilized During Treatment: Gait belt Activity Tolerance: Patient limited by fatigue Patient left: in bed;with call bell/phone within reach;with bed alarm set Nurse Communication: Mobility status PT Visit Diagnosis: Unsteadiness on feet (R26.81);Muscle weakness (  generalized) (M62.81);Difficulty in walking, not elsewhere classified (R26.2)     Time: 9872-1587 PT Time Calculation (min) (ACUTE ONLY): 15 min  Charges:  $Gait Training: 8-22 mins $Therapeutic Activity: 8-22 mins                    G Codes:       Alexandria [name] 01/02/2017, 5:24 PM

## 2016-12-06 NOTE — Progress Notes (Signed)
Much to my surprise, Alexandria [name] is still in the ICU. I thought that she would have been transferred out over the weekend.  I suppose she is still in the ICU because of mental status issues.  I really don't think that her mental status changes are anything to do with her breast cancer. She does not have liver failure. She does not have hypercalcemia.  We have started her on therapy for the breast cancer. She has received Faslodex. She probably will be due for her second set of injections next week.  As an outpatient, I'm trying to get her approved for one of the new oral agents-Verzenio. This is one of the CDK4/CDK6 inhibitors. The nice thing about  Verzenio is that you take it daily. This would make it easier for her to remember.  Unfortunately, the co-pay for her is $2600. This is way too much. We are trying to see how we get assistance for her.  Her liver tests have not been all that bad. Her ammonia has been okay.  It looks like she's trying to do some physical therapy.  There has been no bleeding. Hemoglobin is 9.1. I probably would give her a dose of iron. I think she could clearly benefit from this.  Otherwise, I really don't have much to suggest. I think his can be all about her going to rehabilitation. I the patient might be going to skilled nursing. Hopefully, she will be able to get the Verzenio.  As always, she is getting fantastic care from the staff in the ICU.  [name], MD  [place] 23:25

## 2016-12-06 NOTE — Progress Notes (Signed)
PROGRESS NOTE    Alexandria [name]  ZOX:[id number] DOB: December 17, 1950 DOA: 11/23/2016 PCP: Nyoka Cowden, MD     Brief Narrative:  66 yo female with metastatic breast cancer, admitted to the intensive care on 11/23/16 due to shock associated to upper GI bleed, requiring invasive mechanical ventilation for airway protection. Frank hematemesis with abdominal pain. Endoscopy with ulcerated lesion at the GEJ, sp clips and epinephrine injection. Follow egd with no further bleeding. Liberated from mechanical ventilation on 03/15. Encephalopathy has been improving. Will need SNF. Resumed lactulose with improved mentation.     Assessment & Plan:   Active Problems:   HTN (hypertension), Chronic Diastolic HF, h/o HLD and CAD.    Acute upper GI bleed   Leukocytosis   Acute blood loss anemia, Coagulopathy & mild thrombocytopenia    Electrolyte imbalance   Acute hypoxemic respiratory failure ([place]) in setting of hemorrhagic shock. LLL atelectasis. Pulmonary edema.    Acute encephalopathy, both metabolic and hepatic    Abnormal liver enzymes. Had CT imaging c/w cirrhosis but Biopsy negative.    Abdominal pain   1. Upper GI bleed with acute blood loss anemia, massive gi bleed with hypovolemic shock/required intubation, icu admision s/p Nissen a few weeks ago She became hemodynamically unstable and intubated for airway protection Urgent EGD on 3/8: "Large ulceration at GEJ, with nodule as described above with active bleeding. Clips were placed.                            Injected with epinephrine. Unclear if complete hemostasis was achieved, visualization was poor                            given the amount of blood and clot in the lumen  - Blood noted throughout the examined stomach and                            small bowel with poor visualization" Repeat EGD on 3/10  "suspect patient had bleeding at large ulceration at surgical anastomosis. Nissen is not                            intact and  suspect bleeding from ulceration, and perhaps sutures were ruptured from patient's                            vomiting post-operatively. Given her underlying malignancy she is also at risk for poor wound                            healing. No active bleeding noted during this exam. She received prbc transfusion x2untis, and multple ffp transfusion during her stay in the ICU, she was treated with ppi drip then bid pantoprazole.  She is extubated on 3/15 and transferred to hospitalist service on 3/17 Diet as tolerated. Hb and hct have remained stable.   2. Metabolic encephalopathy/ hepatic porto-systemic encephalopathy/ICU delirium? 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   continue with tid lactulosa, ammonia level in the 50's.  Continue as needed alprazolam  and qhslexapro.  Mentation has improved, transfer out of icu  3. Chronic and stable diastolic heart failure.  Tolerating wellcoreg, holding on  Lisinopril due to borderline bp Resume diuretics due to bilateral lower extremity edema, total of 8.7liter +fluids balance as of 3/21, elevate legs, d/c ivf Echo /venous ultrasound pending. Currently no chest pain, on room air  4. Metastatic breast cancer with liver mets. Oncology Dr Marin Olp started her on monthly hormonal therapy with Faslodex, first dose given on 3/14 .  Dr Marin Olp also planning to start her on cell cycle check point inhibitor [facility] outpatient  Appreciate oncology input.  5. HTN. Continue coreg, hold lisinopril due to borderline bp, diuese if bp allows    DVT prophylaxis:scd Code Status:Partial (no cpr) Family Communication:I spoke with patient's family at the bedside and all questions were addressed,  Disposition Plan:Home    Consultants:  GI   icu /critical care  admission ,[facility] transfer on 3/17  Procedures: Upper endoscopy 03/8 and 03/10. Intubation on 3/8, extubation on 3/15 Right IJ placement on 3/8 prbc transfusion on 3/8 and 3/10 FFP transfusion on 3/8, 3/9 , 3/10 and 3/11    Antimicrobials:  rocephin from 3/9 to 3/11 augmentin from 3/21 for possible uti  Subjective: aaox3, still slow in talking, edematous, rihgt ij In place, dark urine with sediment in foley, sinus tachycardia, borderline bp  c/o itchy skin on pannus   Objective: Vitals:   12/06/16 0321 12/06/16 0400 12/06/16 0500 12/06/16 0600  BP:  94/63 (!) 97/58 (!) 102/58  Pulse:  88 90 (!) 133  Resp:  20 18 17   Temp: 98.3 F (36.8 C)     TempSrc: Oral     SpO2:  90% 90% 90%  Weight:   117.1 kg (258 lb 2.5 oz)   Height:        Intake/Output Summary (Last 24 hours) at 12/06/16 0906 Last data filed at 12/06/16 0700  Gross per 24 hour  Intake             2295 ml  Output              255 ml  Net             2040 ml   Filed Weights   12/04/16 0257 12/05/16 0436 12/06/16 0500  Weight: 113.9 kg (251 lb 1.7 oz) 116.3 kg (256 lb 6.3 oz) 117.1 kg (258 lb 2.5 oz)    Examination:  General exam: deconditioned, aaox3 this am, but still slow in answering questions E ENT: mild pallor, oral mucosa dry.  Respiratory system: diminished at basis,  No wheezing or rales.  Cardiovascular system: S1 & S2 heard, RRR. No JVD, murmurs, rubs, gallops or clicks. + bilateral lower extremity pitting edema. Gastrointestinal system: Abdomen is nondistended, soft and nontender. No organomegaly or masses felt. Normal bowel sounds heard. Lower abdominal wall edema Central nervous system: Alert and oriented. No focal neurological deficits. Extremities: Symmetric 5 x 5 power.  + bilateral lower extremity pitting edema. Skin: Rash in the lower abdominal skin fold.     Data Reviewed: I have personally reviewed following labs and imaging studies  CBC:  Recent Labs Lab 12/01/16 0520  12/02/16 0450 12/03/16 0456 12/04/16 0500 12/06/16 0459  WBC 12.2* 12.5* 12.1* 7.8 8.8  NEUTROABS 7.6 7.5 6.8 4.4 4.8  HGB 9.8* 9.8* 9.3* 8.2* 9.1*  HCT 29.3* 29.0* 27.6* 24.7* 27.0*  MCV 78.6 79.0 80.0 79.9 79.4  PLT 242 253 227 153 174  Basic Metabolic Panel:  Recent Labs Lab 12/02/16 0450 12/03/16 0456 12/03/16 0500 12/04/16 0500 12/05/16 0432 12/06/16 0459  NA 138 140  --  142 142 141  K 3.7 3.5  --  3.1* 3.0* 3.6  CL 108 109  --  115* 113* 114*  CO2 21* 23  --  23 22 20*  GLUCOSE 106* 98  --  116* 113* 117*  BUN 13 14  --  11 10 8   CREATININE 0.80 0.77  --  0.59 0.67 0.72  CALCIUM 8.4* 8.4*  --  8.1* 8.1* 8.3*  MG 1.8 1.9  --  1.9 1.9 1.7  PHOS 2.9  --  2.9 2.4* 2.4* 2.4*   GFR: Estimated Creatinine Clearance: 93.6 mL/min (by C-G formula based on SCr of 0.72 mg/dL). Liver Function Tests:  Recent Labs Lab 12/02/16 0450 12/03/16 0500 12/04/16 0500 12/05/16 0432 12/06/16 0459  ALBUMIN 2.4* 2.3* 2.0* 1.9* 1.9*    Recent Labs Lab 11/30/16 2236  LIPASE 46  AMYLASE 52    Recent Labs Lab 12/03/16 1852 12/04/16 0500 12/05/16 0432 12/06/16 0459  AMMONIA 47* 54* 58* 46*   Coagulation Profile:  Recent Labs Lab 12/02/16 0450 12/03/16 0456 12/04/16 0500 12/05/16 0432 12/06/16 0459  INR 1.24 1.29 1.43 1.28 1.33   Cardiac Enzymes: No results for input(s): CKTOTAL, CKMB, CKMBINDEX, TROPONINI in the last 168 hours. BNP (last 3 results) No results for input(s): PROBNP in the last 8760 hours. HbA1C: No results for input(s): HGBA1C in the last 72 hours. CBG:  Recent Labs Lab 12/01/16 0823 12/03/16 1710  GLUCAP 87 120*   Lipid Profile: No results for input(s): CHOL, HDL, LDLCALC, TRIG, CHOLHDL, LDLDIRECT in the last 72 hours. Thyroid Function Tests: No results for input(s): TSH, T4TOTAL, FREET4, T3FREE, THYROIDAB in the last 72 hours. Anemia Panel: No results for input(s): VITAMINB12, FOLATE, FERRITIN, TIBC, IRON, RETICCTPCT in the last 72  hours. Sepsis Labs: No results for input(s): PROCALCITON, LATICACIDVEN in the last 168 hours.  No results found for this or any previous visit (from the past 240 hour(s)).       Radiology Studies: Dg Chest 1 View  Result Date: 12/04/2016 CLINICAL DATA:  Dyspnea. EXAM: CHEST 1 VIEW COMPARISON:  11/28/2016 and 11/27/2016 FINDINGS: Endotracheal tube and central line at been removed. Heart size and pulmonary vascularity are now normal. Slight residual atelectasis of both lung bases. Chronic elevation of the right hemidiaphragm. IMPRESSION: Slight residual atelectasis at the lung bases. Electronically Signed   By: Lorriane Shire M.D.   On: 12/04/2016 09:53        Scheduled Meds: . carvedilol  3.125 mg Oral BID WC  . Chlorhexidine Gluconate Cloth  6 each Topical Q0600  . escitalopram  10 mg Oral QHS  . feeding supplement  1 Container Oral TID WC  . fentaNYL (SUBLIMAZE) injection  50 mcg Intravenous Once  . furosemide  40 mg Intravenous Once  . lactulose  30 g Oral TID  . mouth rinse  15 mL Mouth Rinse BID  . multivitamin with minerals  1 tablet Oral Daily  . nystatin cream   Topical BID  . pantoprazole  40 mg Oral BID  . potassium chloride  40 mEq Oral Once   Continuous Infusions: . dextrose 5% lactated ringers 75 mL/hr at 12/06/16 0600     LOS: 13 days    Time spent>22mins   Amauris Debois, MD PhD [facility] Pager [phone]  If 7PM-7AM, please contact night-coverage [email or web address] Password [email or web address] 12/06/2016, 9:06 AM

## 2016-12-07 ENCOUNTER — Inpatient Hospital Stay (HOSPITAL_COMMUNITY): Payer: Medicare Other

## 2016-12-07 DIAGNOSIS — I5031 Acute diastolic (congestive) heart failure: Secondary | ICD-10-CM

## 2016-12-07 DIAGNOSIS — I509 Heart failure, unspecified: Secondary | ICD-10-CM

## 2016-12-07 DIAGNOSIS — E878 Other disorders of electrolyte and fluid balance, not elsewhere classified: Secondary | ICD-10-CM

## 2016-12-07 DIAGNOSIS — M7989 Other specified soft tissue disorders: Secondary | ICD-10-CM

## 2016-12-07 LAB — CBC WITH DIFFERENTIAL/PLATELET
BASOS PCT: 0 %
Basophils Absolute: 0 10*3/uL (ref 0.0–0.1)
EOS ABS: 0.4 10*3/uL (ref 0.0–0.7)
EOS PCT: 4 %
HEMATOCRIT: 27.5 % — AB (ref 36.0–46.0)
Hemoglobin: 9.2 g/dL — ABNORMAL LOW (ref 12.0–15.0)
Lymphocytes Relative: 19 %
Lymphs Abs: 1.8 10*3/uL (ref 0.7–4.0)
MCH: 26.6 pg (ref 26.0–34.0)
MCHC: 33.5 g/dL (ref 30.0–36.0)
MCV: 79.5 fL (ref 78.0–100.0)
MONO ABS: 1.7 10*3/uL — AB (ref 0.1–1.0)
MONOS PCT: 18 %
Neutro Abs: 5.5 10*3/uL (ref 1.7–7.7)
Neutrophils Relative %: 59 %
Platelets: 145 10*3/uL — ABNORMAL LOW (ref 150–400)
RBC: 3.46 MIL/uL — ABNORMAL LOW (ref 3.87–5.11)
RDW: 18.2 % — ABNORMAL HIGH (ref 11.5–15.5)
WBC: 9.4 10*3/uL (ref 4.0–10.5)

## 2016-12-07 LAB — COMPREHENSIVE METABOLIC PANEL
ALBUMIN: 2 g/dL — AB (ref 3.5–5.0)
ALK PHOS: 136 U/L — AB (ref 38–126)
ALT: 72 U/L — AB (ref 14–54)
ANION GAP: 10 (ref 5–15)
AST: 213 U/L — ABNORMAL HIGH (ref 15–41)
BILIRUBIN TOTAL: 6.4 mg/dL — AB (ref 0.3–1.2)
BUN: 10 mg/dL (ref 6–20)
CALCIUM: 8.4 mg/dL — AB (ref 8.9–10.3)
CO2: 22 mmol/L (ref 22–32)
CREATININE: 0.79 mg/dL (ref 0.44–1.00)
Chloride: 108 mmol/L (ref 101–111)
GFR calc Af Amer: >60 mL/min (ref >60)
GFR calc non Af Amer: >60 mL/min (ref >60)
GLUCOSE: 109 mg/dL — AB (ref 65–99)
Potassium: 3.6 mmol/L (ref 3.5–5.1)
Sodium: 140 mmol/L (ref 135–145)
TOTAL PROTEIN: 6.1 g/dL — AB (ref 6.5–8.1)

## 2016-12-07 LAB — APTT: APTT: 42 s — AB (ref 24–36)

## 2016-12-07 LAB — ECHOCARDIOGRAM COMPLETE
Height: 66 in
Weight: 4059.99 oz

## 2016-12-07 LAB — PHOSPHORUS: Phosphorus: 3.1 mg/dL (ref 2.5–4.6)

## 2016-12-07 LAB — MAGNESIUM: MAGNESIUM: 1.7 mg/dL (ref 1.7–2.4)

## 2016-12-07 MED ORDER — POTASSIUM CHLORIDE CRYS ER 20 MEQ PO TBCR
40.0000 meq | EXTENDED_RELEASE_TABLET | ORAL | Status: AC
  Administered 2016-12-07 (×2): 40 meq via ORAL
  Filled 2016-12-07 (×2): qty 2

## 2016-12-07 MED ORDER — FUROSEMIDE 10 MG/ML IJ SOLN
40.0000 mg | Freq: Every day | INTRAMUSCULAR | Status: AC
  Administered 2016-12-07 – 2016-12-09 (×3): 40 mg via INTRAVENOUS
  Filled 2016-12-07 (×3): qty 4

## 2016-12-07 NOTE — Progress Notes (Signed)
  Echocardiogram 2D Echocardiogram has been performed.  Alexandria [name] 12/07/2016, 3:03 PM

## 2016-12-07 NOTE — Progress Notes (Signed)
PROGRESS NOTE    Alexandria [name]  ZHG:[id number] DOB: 1950/10/21 DOA: 11/23/2016 PCP: Nyoka Cowden, MD     Brief Narrative:  66 yo female with metastatic breast cancer, admitted to the intensive care on 11/23/16 due to shock associated to upper GI bleed, requiring invasive mechanical ventilation for airway protection. Frank hematemesis with abdominal pain. Endoscopy with ulcerated lesion at the GEJ, sp clips and epinephrine injection. Follow egd with no further bleeding. Liberated from mechanical ventilation on 03/15. Encephalopathy has been improving. Will need SNF. Resumed lactulose with improved mentation.     Assessment & Plan:   Active Problems:   HTN (hypertension), Chronic Diastolic HF, h/o HLD and CAD.    Acute upper GI bleed   Leukocytosis   Acute blood loss anemia, Coagulopathy & mild thrombocytopenia    Electrolyte imbalance   Acute hypoxemic respiratory failure ([place]) in setting of hemorrhagic shock. LLL atelectasis. Pulmonary edema.    Acute encephalopathy, both metabolic and hepatic    Abnormal liver enzymes. Had CT imaging c/w cirrhosis but Biopsy negative.    Abdominal pain   1. Upper GI bleed with acute blood loss anemia, massive gi bleed with hypovolemic shock/required intubation, icu admision s/p Nissen a few weeks ago She became hemodynamically unstable and intubated for airway protection Urgent EGD on 3/8: "Large ulceration at GEJ, with nodule as described above with active bleeding. Clips were placed.                            Injected with epinephrine. Unclear if complete hemostasis was achieved, visualization was poor                            given the amount of blood and clot in the lumen  - Blood noted throughout the examined stomach and                            small bowel with poor visualization" Repeat EGD on 3/10  "suspect patient had bleeding at large ulceration at surgical anastomosis. Nissen is not                            intact and  suspect bleeding from ulceration, and perhaps sutures were ruptured from patient's                            vomiting post-operatively. Given her underlying malignancy she is also at risk for poor wound                            healing. No active bleeding noted during this exam. She received prbc transfusion x2untis, and multple ffp transfusion during her stay in the ICU, she was treated with ppi drip then bid pantoprazole.  She is extubated on 3/15 and transferred to hospitalist service on 3/17 Diet as tolerated. Hb and hct have remained stable. She received iv ironx1 on 3/22 per hematology/oncology   2. Metabolic encephalopathy/ hepatic porto-systemic encephalopathy/ICU delirium? 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   continue with tid lactulosa, ammonia level  in the 50's.  Continue as needed alprazolam and qhslexapro.  Mentation has improved, still slow in answering question, but aaox3, transfer out of icu  3. Chronic and stable diastolic heart failure.  Tolerating wellcoreg, holding on  Lisinopril due to borderline bp Resume diuretics due to bilateral lower extremity edema, total of 8.7liter +fluids balance as of 3/21, elevate legs, d/c ivf, start lasix 40mg  iv daily from 3/21 Echo /venous ultrasound pending. Currently no chest pain, on room air  4. Metastatic breast cancer with liver mets. Oncology Dr Marin Olp started her on monthly hormonal therapy with Faslodex, first dose given on 3/14 .  Dr Marin Olp also planning to start her on cell cycle check point inhibitor [facility] outpatient  Appreciate oncology input.  5. HTN. Continue coreg, hold lisinopril due to borderline bp, diuese if bp allows  6. UTI? Urine dark with sediment, ua suspect uti, urine culture pending, started augmentin, no  fever, no leukocytosis. Remove foley Remove central line    DVT prophylaxis:scd Code Status:Partial (no cpr) Family Communication:patient Disposition Plan:SNF   Consultants:  GI   Oncology  General surgery  icu /critical care admission ,[facility] transfer on 3/17  Procedures: Upper endoscopy 03/8 and 03/10. Intubation on 3/8, extubation on 3/15 Right IJ placement on 3/8 prbc transfusion on 3/8 and 3/10 FFP transfusion on 3/8, 3/9 , 3/10 and 3/11    Antimicrobials:  rocephin from 3/9 to 3/11 augmentin from 3/21 for possible uti  Subjective: aaox3, still slow in talking, edematous, rihgt ij In place, dark urine with sediment in foley, bp and heart rate has improved.  c/o itchy skin on pannus   Objective: Vitals:   12/07/16 0303 12/07/16 0400 12/07/16 0500 12/07/16 0800  BP:  (!) 108/58  (!) 95/59  Pulse:  89  89  Resp:  19  14  Temp: 98.9 F (37.2 C)     TempSrc: Oral     SpO2:  92%  90%  Weight:   115.1 kg (253 lb 12 oz)   Height:        Intake/Output Summary (Last 24 hours) at 12/07/16 0850 Last data filed at 12/07/16 0500  Gross per 24 hour  Intake           630.08 ml  Output             2985 ml  Net         -2354.92 ml   Filed Weights   12/05/16 0436 12/06/16 0500 12/07/16 0500  Weight: 116.3 kg (256 lb 6.3 oz) 117.1 kg (258 lb 2.5 oz) 115.1 kg (253 lb 12 oz)    Examination:  General exam: deconditioned, aaox3 this am, but still slow in answering questions E ENT: mild pallor, oral mucosa dry.  Respiratory system: diminished at basis,  No wheezing or rales.  Cardiovascular system: S1 & S2 heard, RRR. No JVD, murmurs, rubs, gallops or clicks. + bilateral lower extremity pitting edema. Gastrointestinal system: Abdomen is nondistended, soft and nontender. No organomegaly or masses felt. Normal bowel sounds heard. Lower abdominal wall edema Central nervous system: Alert and oriented. No focal neurological deficits. Extremities: Symmetric 5  x 5 power.  + bilateral lower extremity pitting edema. Skin: Rash in the lower abdominal skin fold.     Data Reviewed: I have personally reviewed following labs and imaging studies  CBC:  Recent Labs Lab 12/02/16 0450 12/03/16 0456 12/04/16 0500 12/06/16 0459 12/07/16 0500  WBC 12.5* 12.1* 7.8 8.8 9.4  NEUTROABS 7.5 6.8 4.4 4.8  5.5  HGB 9.8* 9.3* 8.2* 9.1* 9.2*  HCT 29.0* 27.6* 24.7* 27.0* 27.5*  MCV 79.0 80.0 79.9 79.4 79.5  PLT 253 227 153 174 244*   Basic Metabolic Panel:  Recent Labs Lab 12/03/16 0456 12/03/16 0500 12/04/16 0500 12/05/16 0432 12/06/16 0459 12/07/16 0500  NA 140  --  142 142 141 140  K 3.5  --  3.1* 3.0* 3.6 3.6  CL 109  --  115* 113* 114* 108  CO2 23  --  23 22 20* 22  GLUCOSE 98  --  116* 113* 117* 109*  BUN 14  --  11 10 8 10   CREATININE 0.77  --  0.59 0.67 0.72 0.79  CALCIUM 8.4*  --  8.1* 8.1* 8.3* 8.4*  MG 1.9  --  1.9 1.9 1.7 1.7  PHOS  --  2.9 2.4* 2.4* 2.4* 3.1   GFR: Estimated Creatinine Clearance: 92.7 mL/min (by C-G formula based on SCr of 0.79 mg/dL). Liver Function Tests:  Recent Labs Lab 12/03/16 0500 12/04/16 0500 12/05/16 0432 12/06/16 0459 12/07/16 0500  AST  --   --   --   --  213*  ALT  --   --   --   --  72*  ALKPHOS  --   --   --   --  136*  BILITOT  --   --   --   --  6.4*  PROT  --   --   --   --  6.1*  ALBUMIN 2.3* 2.0* 1.9* 1.9* 2.0*    Recent Labs Lab 11/30/16 2236  LIPASE 46  AMYLASE 52    Recent Labs Lab 12/03/16 1852 12/04/16 0500 12/05/16 0432 12/06/16 0459  AMMONIA 47* 54* 58* 46*   Coagulation Profile:  Recent Labs Lab 12/02/16 0450 12/03/16 0456 12/04/16 0500 12/05/16 0432 12/06/16 0459  INR 1.24 1.29 1.43 1.28 1.33   Cardiac Enzymes: No results for input(s): CKTOTAL, CKMB, CKMBINDEX, TROPONINI in the last 168 hours. BNP (last 3 results) No results for input(s): PROBNP in the last 8760 hours. HbA1C: No results for input(s): HGBA1C in the last 72 hours. CBG:  Recent  Labs Lab 12/01/16 0823 12/03/16 1710  GLUCAP 87 120*   Lipid Profile: No results for input(s): CHOL, HDL, LDLCALC, TRIG, CHOLHDL, LDLDIRECT in the last 72 hours. Thyroid Function Tests: No results for input(s): TSH, T4TOTAL, FREET4, T3FREE, THYROIDAB in the last 72 hours. Anemia Panel: No results for input(s): VITAMINB12, FOLATE, FERRITIN, TIBC, IRON, RETICCTPCT in the last 72 hours. Sepsis Labs: No results for input(s): PROCALCITON, LATICACIDVEN in the last 168 hours.  No results found for this or any previous visit (from the past 240 hour(s)).       Radiology Studies: No results found.      Scheduled Meds: . amoxicillin-clavulanate  1 tablet Oral Q12H  . carvedilol  3.125 mg Oral BID WC  . Chlorhexidine Gluconate Cloth  6 each Topical Q0600  . escitalopram  10 mg Oral QHS  . famotidine (PEPCID) IV  40 mg Intravenous Once  . feeding supplement  1 Container Oral TID WC  . fentaNYL (SUBLIMAZE) injection  50 mcg Intravenous Once  . ferumoxytol  510 mg Intravenous Once  . furosemide  40 mg Intravenous Daily  . lactulose  30 g Oral TID  . mouth rinse  15 mL Mouth Rinse BID  . multivitamin with minerals  1 tablet Oral Daily  . nystatin cream   Topical BID  . pantoprazole  40 mg Oral BID  . potassium chloride  40 mEq Oral Q4H   Continuous Infusions:    LOS: 14 days    Time spent>53mins   Lateka Rady, MD PhD [facility] Pager [phone]  If 7PM-7AM, please contact night-coverage [email or web address] Password [facility] 12/07/2016, 8:50 AM

## 2016-12-07 NOTE — Progress Notes (Signed)
CSW assisting with d/c planning. Son has SNF bed offers and plans to contact CSW later today with SNF choice. CSW will continue to assist with dc planning to SNF.  Werner Lean LCSW [phone]

## 2016-12-07 NOTE — Consult Note (Signed)
   [facility] CM Inpatient Consult   12/07/2016  Alexandria [name] 01-03-1951 [id number]      Patient screened for potential [facility] Care Management services. Chart reviewed. Noted current discharge plan is for SNF.  There are no identifiable [facility] Care Management needs at this time. If patient's post hospital needs change, please place a [facility] Care Management consult. For questions please contact:  Marthenia Rolling, [place], RN,BSN [facility] Liaison [phone]

## 2016-12-07 NOTE — Progress Notes (Signed)
*  PRELIMINARY RESULTS* Vascular Ultrasound Lower extremity venous duplex has been completed.  Preliminary findings: No evidence of DVT or baker's cyst.   Landry Mellow, RDMS, RVT  12/07/2016, 9:01 AM

## 2016-12-08 DIAGNOSIS — I5033 Acute on chronic diastolic (congestive) heart failure: Secondary | ICD-10-CM

## 2016-12-08 DIAGNOSIS — R748 Abnormal levels of other serum enzymes: Secondary | ICD-10-CM

## 2016-12-08 DIAGNOSIS — R5381 Other malaise: Secondary | ICD-10-CM

## 2016-12-08 DIAGNOSIS — R17 Unspecified jaundice: Secondary | ICD-10-CM

## 2016-12-08 DIAGNOSIS — K7460 Unspecified cirrhosis of liver: Secondary | ICD-10-CM

## 2016-12-08 LAB — RENAL FUNCTION PANEL
ALBUMIN: 1.9 g/dL — AB (ref 3.5–5.0)
Anion gap: 10 (ref 5–15)
BUN: 11 mg/dL (ref 6–20)
CO2: 23 mmol/L (ref 22–32)
Calcium: 8.3 mg/dL — ABNORMAL LOW (ref 8.9–10.3)
Chloride: 107 mmol/L (ref 101–111)
Creatinine, Ser: 0.73 mg/dL (ref 0.44–1.00)
GFR calc Af Amer: >60 mL/min (ref >60)
GLUCOSE: 105 mg/dL — AB (ref 65–99)
PHOSPHORUS: 2.9 mg/dL (ref 2.5–4.6)
POTASSIUM: 3.5 mmol/L (ref 3.5–5.1)
Sodium: 140 mmol/L (ref 135–145)

## 2016-12-08 LAB — AMMONIA: AMMONIA: 96 umol/L — AB (ref 9–35)

## 2016-12-08 LAB — URINE CULTURE: Culture: >=100000 — AB

## 2016-12-08 LAB — CBC WITH DIFFERENTIAL/PLATELET
BASOS ABS: 0 10*3/uL (ref 0.0–0.1)
Basophils Relative: 0 %
EOS ABS: 0.4 10*3/uL (ref 0.0–0.7)
Eosinophils Relative: 6 %
HCT: 27.7 % — ABNORMAL LOW (ref 36.0–46.0)
Hemoglobin: 9.3 g/dL — ABNORMAL LOW (ref 12.0–15.0)
LYMPHS PCT: 22 %
Lymphs Abs: 1.6 10*3/uL (ref 0.7–4.0)
MCH: 26.7 pg (ref 26.0–34.0)
MCHC: 33.6 g/dL (ref 30.0–36.0)
MCV: 79.6 fL (ref 78.0–100.0)
MONO ABS: 1.4 10*3/uL — AB (ref 0.1–1.0)
Monocytes Relative: 20 %
Neutro Abs: 3.7 10*3/uL (ref 1.7–7.7)
Neutrophils Relative %: 52 %
PLATELETS: 138 10*3/uL — AB (ref 150–400)
RBC: 3.48 MIL/uL — ABNORMAL LOW (ref 3.87–5.11)
RDW: 18.5 % — AB (ref 11.5–15.5)
WBC: 7.1 10*3/uL (ref 4.0–10.5)

## 2016-12-08 LAB — MAGNESIUM: Magnesium: 1.6 mg/dL — ABNORMAL LOW (ref 1.7–2.4)

## 2016-12-08 LAB — APTT: APTT: 44 s — AB (ref 24–36)

## 2016-12-08 MED ORDER — FLUCONAZOLE IN SODIUM CHLORIDE 200-0.9 MG/100ML-% IV SOLN
200.0000 mg | Freq: Once | INTRAVENOUS | Status: AC
  Administered 2016-12-08: 200 mg via INTRAVENOUS
  Filled 2016-12-08: qty 100

## 2016-12-08 MED ORDER — POTASSIUM CHLORIDE CRYS ER 20 MEQ PO TBCR
40.0000 meq | EXTENDED_RELEASE_TABLET | Freq: Once | ORAL | Status: AC
  Administered 2016-12-08: 40 meq via ORAL
  Filled 2016-12-08: qty 2

## 2016-12-08 MED ORDER — HYDROCORTISONE 1 % EX CREA
TOPICAL_CREAM | Freq: Three times a day (TID) | CUTANEOUS | Status: DC
  Administered 2016-12-08 – 2016-12-11 (×9): via TOPICAL
  Filled 2016-12-08: qty 28

## 2016-12-08 MED ORDER — FLUCONAZOLE 100 MG PO TABS
100.0000 mg | ORAL_TABLET | Freq: Every day | ORAL | Status: DC
  Administered 2016-12-09 – 2016-12-11 (×3): 100 mg via ORAL
  Filled 2016-12-08 (×3): qty 1

## 2016-12-08 MED ORDER — MAGNESIUM SULFATE 2 GM/50ML IV SOLN
2.0000 g | Freq: Once | INTRAVENOUS | Status: AC
  Administered 2016-12-08: 2 g via INTRAVENOUS
  Filled 2016-12-08: qty 50

## 2016-12-08 NOTE — Progress Notes (Signed)
PROGRESS NOTE    Alexandria [name]  YPP:[id number] DOB: 03-29-1951 DOA: 11/23/2016 PCP: Nyoka Cowden, MD     Brief Narrative:  66 yo female with metastatic breast cancer, admitted to the intensive care on 11/23/16 due to shock associated to upper GI bleed, requiring invasive mechanical ventilation for airway protection. Frank hematemesis with abdominal pain. Endoscopy with ulcerated lesion at the GEJ, sp clips and epinephrine injection. Follow egd with no further bleeding. Liberated from mechanical ventilation on 03/15. Encephalopathy has been improving. Will need SNF. Resumed lactulose with improved mentation.     Assessment & Plan:   Active Problems:   HTN (hypertension), Chronic Diastolic HF, h/o HLD and CAD.    Acute upper GI bleed   Leukocytosis   Acute blood loss anemia, Coagulopathy & mild thrombocytopenia    Electrolyte imbalance   Acute hypoxemic respiratory failure ([place]) in setting of hemorrhagic shock. LLL atelectasis. Pulmonary edema.    Acute encephalopathy, both metabolic and hepatic    Abnormal liver enzymes. Had CT imaging c/w cirrhosis but Biopsy negative.    Abdominal pain   1. Upper GI bleed with acute blood loss anemia, massive gi bleed with hypovolemic shock/required intubation, icu admision s/p Nissen a few weeks ago She became hemodynamically unstable and intubated for airway protection Urgent EGD on 3/8: "Large ulceration at GEJ, with nodule as described above with active bleeding. Clips were placed.                            Injected with epinephrine. Unclear if complete hemostasis was achieved, visualization was poor                            given the amount of blood and clot in the lumen  - Blood noted throughout the examined stomach and                            small bowel with poor visualization" Repeat EGD on 3/10  "suspect patient had bleeding at large ulceration at surgical anastomosis. Nissen is not                            intact and  suspect bleeding from ulceration, and perhaps sutures were ruptured from patient's                            vomiting post-operatively. Given her underlying malignancy she is also at risk for poor wound                            healing. No active bleeding noted during this exam. She received prbc transfusion x2untis, and multple ffp transfusion during her stay in the ICU, she was treated with ppi drip then bid pantoprazole.  She is extubated on 3/15 and transferred to hospitalist service on 3/17, transferred out of icu on 3/22 Diet as tolerated. Hb and hct have remained stable. She received iv ironx1 on 3/22 per hematology/oncology  I have discussed with GI  On 3/23 regarding follow up, GI will see patient in the clinic, may repeat egd at follow up.  2. Metabolic encephalopathy/ hepatic porto-systemic encephalopathy/ICU delirium? 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   continue with tid lactulosa, monitor ammonia level.  Continue as needed alprazolam and qhslexapro.  Mentation has improved, still slow in answering question, but aaox3,   3. Acute on chronic diastolic heart failure.  Continue coreg, holding on  Lisinopril due to borderline bp Resume diuretics due to bilateral lower extremity edema, total of 8.7liter +fluids balance as of 3/21, elevate legs, d/c ivf, start lasix 40mg  iv daily from 3/21 Echo lvef 16%-10%, grade 1 diastolic function /venous ultrasound no DVT. Currently no chest pain, on room air  4. Metastatic breast cancer with liver mets. Oncology Dr Marin Olp started her on monthly hormonal therapy with Faslodex, first dose given on 3/14 .  Dr Marin Olp also planning to start her on cell cycle check point inhibitor [facility] outpatient  Appreciate oncology input.  5. HTN. Continue coreg,  hold lisinopril due to borderline bp, continue diuese if bp allows  6. Yeast cystitis  Urine dark with sediment, ua with large leuk, wbc TNTC, RBC TNTC,  she is started on augmentin, urine culture+ yeast, d/c augmentin, start dilfucan no fever, no leukocytosis. Remove foley removed on 3/23 central line removed on 3/23  7 skin  Rash on abdominal pannus, continue nystatin topical and topical steroids    DVT prophylaxis:scd Code Status:Patient changed to full code on 3/23 Family Communication:patient and son over the phone on 3/22 Disposition Plan:SNF   Consultants:  GI   Oncology  General surgery  icu /critical care admission ,[facility] transfer on 3/17  Procedures: Upper endoscopy 03/8 and 03/10. Intubation on 3/8, extubation on 3/15 Right IJ placement on 3/8, right IJ removed on 3/22 prbc transfusion on 3/8 and 3/10 FFP transfusion on 3/8, 3/9 , 3/10 and 3/11    Antimicrobials:  rocephin from 3/9 to 3/11 augmentin from 3/21 to 3/23  Subjective: aaox3, still slow in talking, edematous, rihgt ij and foley has removed.  c/o itchy skin on pannus   Objective: Vitals:   12/07/16 0900 12/07/16 1201 12/07/16 2009 12/08/16 0555  BP:  136/73 125/90 122/89  Pulse: 90 87 (!) 103 100  Resp: 19 19 18 18   Temp:  98.2 F (36.8 C) 98.7 F (37.1 C) 98.2 F (36.8 C)  TempSrc:  Oral Oral Oral  SpO2: 91% 97% 98% 98%  Weight:    104.7 kg (230 lb 12.8 oz)  Height:       No intake or output data in the 24 hours ending 12/08/16 0809 Filed Weights   12/06/16 0500 12/07/16 0500 12/08/16 0555  Weight: 117.1 kg (258 lb 2.5 oz) 115.1 kg (253 lb 12 oz) 104.7 kg (230 lb 12.8 oz)    Examination:  General exam: deconditioned, aaox3 this am, but still slow in answering questions E ENT: mild pallor, oral mucosa dry.  Respiratory system: diminished at basis,  No wheezing or rales.  Cardiovascular system: S1 & S2 heard, RRR. No JVD, murmurs, rubs, gallops or clicks. +  bilateral lower extremity pitting edema. Gastrointestinal system: Abdomen is nondistended, soft and nontender. No organomegaly or masses felt. Normal bowel sounds heard. Lower abdominal wall edema Central nervous system: Alert and oriented. No focal neurological deficits. Extremities: Symmetric 5 x 5 power.  + bilateral lower extremity pitting edema. Skin: Rash in the lower abdominal skin fold.     Data Reviewed: I have personally reviewed following labs and imaging studies  CBC:  Recent  Labs Lab 12/03/16 0456 12/04/16 0500 12/06/16 0459 12/07/16 0500 12/08/16 0502  WBC 12.1* 7.8 8.8 9.4 7.1  NEUTROABS 6.8 4.4 4.8 5.5 3.7  HGB 9.3* 8.2* 9.1* 9.2* 9.3*  HCT 27.6* 24.7* 27.0* 27.5* 27.7*  MCV 80.0 79.9 79.4 79.5 79.6  PLT 227 153 174 145* 694*   Basic Metabolic Panel:  Recent Labs Lab 12/04/16 0500 12/05/16 0432 12/06/16 0459 12/07/16 0500 12/08/16 0502  NA 142 142 141 140 140  K 3.1* 3.0* 3.6 3.6 3.5  CL 115* 113* 114* 108 107  CO2 23 22 20* 22 23  GLUCOSE 116* 113* 117* 109* 105*  BUN 11 10 8 10 11   CREATININE 0.59 0.67 0.72 0.79 0.73  CALCIUM 8.1* 8.1* 8.3* 8.4* 8.3*  MG 1.9 1.9 1.7 1.7 1.6*  PHOS 2.4* 2.4* 2.4* 3.1 2.9   GFR: Estimated Creatinine Clearance: 88.1 mL/min (by C-G formula based on SCr of 0.73 mg/dL). Liver Function Tests:  Recent Labs Lab 12/04/16 0500 12/05/16 0432 12/06/16 0459 12/07/16 0500 12/08/16 0502  AST  --   --   --  213*  --   ALT  --   --   --  72*  --   ALKPHOS  --   --   --  136*  --   BILITOT  --   --   --  6.4*  --   PROT  --   --   --  6.1*  --   ALBUMIN 2.0* 1.9* 1.9* 2.0* 1.9*   No results for input(s): LIPASE, AMYLASE in the last 168 hours.  Recent Labs Lab 12/03/16 1852 12/04/16 0500 12/05/16 0432 12/06/16 0459 12/08/16 0502  AMMONIA 47* 54* 58* 46* 96*   Coagulation Profile:  Recent Labs Lab 12/02/16 0450 12/03/16 0456 12/04/16 0500 12/05/16 0432 12/06/16 0459  INR 1.24 1.29 1.43 1.28 1.33    Cardiac Enzymes: No results for input(s): CKTOTAL, CKMB, CKMBINDEX, TROPONINI in the last 168 hours. BNP (last 3 results) No results for input(s): PROBNP in the last 8760 hours. HbA1C: No results for input(s): HGBA1C in the last 72 hours. CBG:  Recent Labs Lab 12/01/16 0823 12/03/16 1710  GLUCAP 87 120*   Lipid Profile: No results for input(s): CHOL, HDL, LDLCALC, TRIG, CHOLHDL, LDLDIRECT in the last 72 hours. Thyroid Function Tests: No results for input(s): TSH, T4TOTAL, FREET4, T3FREE, THYROIDAB in the last 72 hours. Anemia Panel: No results for input(s): VITAMINB12, FOLATE, FERRITIN, TIBC, IRON, RETICCTPCT in the last 72 hours. Sepsis Labs: No results for input(s): PROCALCITON, LATICACIDVEN in the last 168 hours.  No results found for this or any previous visit (from the past 240 hour(s)).       Radiology Studies: No results found.      Scheduled Meds: . amoxicillin-clavulanate  1 tablet Oral Q12H  . carvedilol  3.125 mg Oral BID WC  . Chlorhexidine Gluconate Cloth  6 each Topical Q0600  . escitalopram  10 mg Oral QHS  . feeding supplement  1 Container Oral TID WC  . fentaNYL (SUBLIMAZE) injection  50 mcg Intravenous Once  . furosemide  40 mg Intravenous Daily  . lactulose  30 g Oral TID  . magnesium sulfate 1 - 4 g bolus IVPB  2 g Intravenous Once  . mouth rinse  15 mL Mouth Rinse BID  . multivitamin with minerals  1 tablet Oral Daily  . nystatin cream   Topical BID  . pantoprazole  40 mg Oral BID  . potassium chloride  40  mEq Oral Once   Continuous Infusions:    LOS: 15 days    Time spent>51mins   Ivon Oelkers, MD PhD [facility] Pager [phone]  If 7PM-7AM, please contact night-coverage [email or web address] Password [facility] 12/08/2016, 8:09 AM

## 2016-12-08 NOTE — Progress Notes (Signed)
PT Cancellation Note  Patient Details Name: Alexandria [name] MRN: [id number] DOB: 08-11-1951   Cancelled Treatment:     pt declined die to dizziness and pain.  Pt reports she is getting a lot of Lasix and has been voiding a lot in bed which involves a lot side to side rolling and bed changes.  Pt was not up for any OOB activity today but would like to try tomorrow.    Rica Koyanagi  PTA [facility]  Acute  Rehab Pager      [phone]

## 2016-12-08 NOTE — Progress Notes (Signed)
Care was resumed for this patient at 1530 from [facility]. Agree with previous assessment. While receiving bedside report, patient heard that she is a Limited Code. Pt stated she did not want to be a Limited Code, but she instead wants to be a Full Code. Pt stated she wants all medications, chest compressions and any other measures necessary in the case of a code. MD Xu notified and changed Code status to Full Code. Pt aware.

## 2016-12-08 NOTE — Progress Notes (Addendum)
Alexandria [name] is now on [address]. She looks and sounds much better. She is quite alert.  It is hard see how much she is eating right now.  I'm sure that she is still quite weak. I'm sure that she is getting some physical therapy.  I saw with her labs yesterday, her bilirubin was up. This is somewhat concerning for me  Her ammonia is also up quite a bit. Her ammonia was 96. I am somewhat surprised by this. She had had relatively normal levels previously.  It is possible that the hepatic insufficiency might be reflective of her underlying cancer. Again she has not had this problem before. She has been started on treatment. Hopefully, we will be able to see some treatment response.  I just wonder if she has some underlying liver cirrhosis that could also be attributing to the elevated ammonia and elevated liver function studies. I think going away to show this would be with a liver biopsy. On her CT scan done back in early March, this did show cirrhosis.  On her physical exam, her vital signs are relatively stable. Her blood pressure is 120/89. Her pulse is 100. Temperature 98.2. Her abdomen is soft. She has slightly decreased bowel sounds. She has no guarding. There may be some slight tenderness to palpation diffusely. She has no palpable abdominal mass. There is no fluid wave. Her lungs sound clear bilaterally. Cardiac exam regular rate and rhythm.  I think that her liver studies will not be watched closely.  I'm not sure with the discharge plans for her might be.  I know that she will get fantastic care from everybody up on [place], MD  ADDENDUM:  I looked a message on her son's cell phone. I told him what I thought was going on I told him I was worried about her liver tests. I know that she has some underlying cirrhosis and this might be a problem. We does have to see how her liver tests trend over the next several days. I also told him that we will were working on trying to get the oral  medication for her breast cancer pain for. I don't want her paying, nor can she pay a $2600 a month.  I also told him in the masses that we had a very good prayer session.  [name], MD  Oswaldo Milian 53:5

## 2016-12-08 NOTE — Care Management Important Message (Signed)
Important Message  Patient Details  Name: Alexandria [name] MRN: [id number] Date of Birth: 1951/07/06   Medicare Important Message Given:  Yes    Kerin Salen 12/08/2016, 11:28 [place] Message  Patient Details  Name: Alexandria [name] MRN: [id number] Date of Birth: 11-May-1951   Medicare Important Message Given:  Yes    Kerin Salen 12/08/2016, 11:28 AM

## 2016-12-08 NOTE — Progress Notes (Signed)
LCSWA spoke with patient son about d/c planning. He reports this morning her spoke with the patient physician and he learned patient kidneys are not functioning well. At this time he is on his way to the hospital from [place]. He is unsure of the patient next step. LCSWA provided support and relayed, weekend CSW will follow up with patient in the a.m for SNF choice. Patient son appreciative and agreeable.   Weekend CSW to follow up with patient son.   Kathrin Greathouse, Latanya Presser, MSW Clinical Social Worker 5E and Psychiatric Service Line [phone] 12/08/2016  1:48 PM

## 2016-12-08 NOTE — Care Management Note (Signed)
Case Management Note  Patient Details  Name: Alexandria [name] MRN: [id number] Date of Birth: 01-30-1951  Subjective/Objective: Pt admitted with Acute upper GI bleed                   Action/Plan:Plan to discharge to SNF. CSW following.    Expected Discharge Date:  12/10/16               Expected Discharge Plan:  Skilled Nursing Facility  In-House Referral:  Clinical Social Work  Discharge planning Services  CM Consult  Post Acute Care Choice:  NA Choice offered to:  NA  DME Arranged:  N/A DME Agency:  NA  HH Arranged:    [place] Agency:  NA  Status of Service:  In process, will continue to follow  If discussed at Long Length of Stay Meetings, dates discussed:    Additional CommentsPurcell Mouton, RN 12/08/2016, 3:20 PM

## 2016-12-09 DIAGNOSIS — R1110 Vomiting, unspecified: Secondary | ICD-10-CM

## 2016-12-09 DIAGNOSIS — R571 Hypovolemic shock: Secondary | ICD-10-CM

## 2016-12-09 LAB — COMPREHENSIVE METABOLIC PANEL
ALBUMIN: 2.1 g/dL — AB (ref 3.5–5.0)
ALK PHOS: 162 U/L — AB (ref 38–126)
ALT: 89 U/L — AB (ref 14–54)
ANION GAP: 11 (ref 5–15)
AST: 266 U/L — AB (ref 15–41)
BILIRUBIN TOTAL: 6.6 mg/dL — AB (ref 0.3–1.2)
BUN: 10 mg/dL (ref 6–20)
CALCIUM: 8.7 mg/dL — AB (ref 8.9–10.3)
CO2: 26 mmol/L (ref 22–32)
Chloride: 101 mmol/L (ref 101–111)
Creatinine, Ser: 0.84 mg/dL (ref 0.44–1.00)
GFR calc Af Amer: >60 mL/min (ref >60)
GFR calc non Af Amer: >60 mL/min (ref >60)
GLUCOSE: 114 mg/dL — AB (ref 65–99)
Potassium: 3.1 mmol/L — ABNORMAL LOW (ref 3.5–5.1)
Sodium: 138 mmol/L (ref 135–145)
TOTAL PROTEIN: 6.5 g/dL (ref 6.5–8.1)

## 2016-12-09 LAB — LIPID PANEL
CHOLESTEROL: 93 mg/dL (ref 0–200)
HDL: <10 mg/dL — ABNORMAL LOW (ref >40)
Triglycerides: 185 mg/dL — ABNORMAL HIGH (ref <150)
VLDL: 37 mg/dL (ref 0–40)

## 2016-12-09 LAB — APTT: aPTT: 42 seconds — ABNORMAL HIGH (ref 24–36)

## 2016-12-09 LAB — AMMONIA: Ammonia: 51 umol/L — ABNORMAL HIGH (ref 9–35)

## 2016-12-09 LAB — MAGNESIUM: Magnesium: 2 mg/dL (ref 1.7–2.4)

## 2016-12-09 MED ORDER — POTASSIUM CHLORIDE CRYS ER 20 MEQ PO TBCR
40.0000 meq | EXTENDED_RELEASE_TABLET | ORAL | Status: AC
  Administered 2016-12-09 (×2): 40 meq via ORAL
  Filled 2016-12-09 (×2): qty 2

## 2016-12-09 NOTE — Clinical Social Work Note (Signed)
SW spoke with patient and her son Legrand Como  [phone] re: SNF bed offers.  First choice is [facility].  Son will go to facility to tour today.  He is asking for an Advanced Directive for his mother; she has verbalized a desire to remain a full code. Advanced Directive document will be provided for their review and asked Dr. Erlinda Hong to consider completion of a Most Form.   Patient is currently a Full Code; MD will need to discuss further with patient/son as well. There is a note from Dr. Martha Clan- Oncologist that patient may require an oral therapy med called Verzenio.  Discussed with Janie- Admissions at [facility] and information added to [facility]- sent updated Fl2 to [facility].  Per Dr.Xu- d/c is anticipated for tomorrow. She has spoken to Dr. Marin Olp and thie previous mentioned Verzenio will be arranged- if indicated- on an outpatient basis.  CSW spoke with patient's son Legrand Como and updated on above- He will go to [facility] to tour. Janie- Admissions is aware of planned d/c tomorrow and will be available to meet son tomorrow to sign paperwork. Patient is agreeable with above.  Advanced Directive document given to patient and discussed document. Spoke with Laurey Arrow- on-call Chaplain who will follow up with patient re: further discussion and possible completion of document. Patient indicated that she wants to remain a full code at this time but does want to complete a Elizabeth. Patient indicated that she really wants a financial power of attorney- it was explained to her that this document would have to be facilitated via a private attorney. She verbalized understanding.  Will ask Sunday CSW coverage to follow up re: planned d/c tomorrow to [facility].  Lorie Phenix. Pauline Good, [place]  (weekend coverage)

## 2016-12-09 NOTE — NC FL2 (Signed)
[place] LEVEL OF CARE SCREENING TOOL     IDENTIFICATION  Patient Name: Alexandria [name] Birthdate: January 14, 1951 Sex: female Admission Date (Current Location): 11/23/2016  [facility] and [place] Number:  Herbalist and Address:  [facility],  [place] [address], [place]      Provider Number: [id number]  Attending Physician Name and Address:  Florencia Reasons, MD  Relative Name and Phone Number:       Current Level of Care: Hospital Recommended Level of Care: [place] Prior Approval Number:    Date Approved/Denied:   PASRR Number: [id number]  Discharge Plan: SNF    Current Diagnoses: Patient Active Problem List   Diagnosis Date Noted  . Abdominal pain   . Acute blood loss anemia, Coagulopathy & mild thrombocytopenia  11/27/2016  . Electrolyte imbalance 11/27/2016  . Acute hypoxemic respiratory failure (HCC) in setting of hemorrhagic shock. LLL atelectasis. Pulmonary edema.  11/27/2016  . Acute encephalopathy, both metabolic and hepatic  41/74/0814  . Abnormal liver enzymes. Had CT imaging c/w cirrhosis but Biopsy negative.  11/27/2016  . Encounter for imaging study to confirm orogastric (OG) tube placement   . Encounter for intubation   . Leukocytosis   . Acute upper GI bleed 11/23/2016  . GIB (gastrointestinal bleeding) 11/23/2016  . Metastatic breast cancer ([place]) 11/11/2016  . Status post laparoscopic Nissen fundoplication 48/18/5631  . Asthma 08/21/2016  . Malignant neoplasm of upper-outer quadrant of female breast ([place])   . Hyperlipidemia 10/23/2014  . Essential hypertension   . Acute respiratory failure with hypoxia ([place]) 09/07/2014  . Lymphedema 09/07/2014  . Nocturnal hypoxemia 08/18/2014  . Ventral incisional hernia with obstruction 07/05/2014  . Coronary artery calcification 05/22/2014  . Mediastinal lymphadenopathy 01/29/2014  . Hiatal hernia 01/23/2014  . Symptomatic anemia 11/19/2013  . Hx of radiation  therapy   . SVT (supraventricular tachycardia) ([place]) 04/19/2013  . Vocal cord dysfunction 04/15/2013  . Acute on chronic diastolic CHF (congestive heart failure) ([place]) 04/14/2013  . Anxiety state 04/14/2013  . Hypokalemia 04/13/2013  . Iron deficiency anemia 04/13/2013  . HTN (hypertension), Chronic Diastolic HF, h/o HLD and CAD.  04/08/2013  . Chronic back pain 04/08/2013  . Morbid obesity ([place]) 04/08/2013  . Adjustment disorder with mixed anxiety and depressed mood 04/08/2013  . Lower extremity edema 04/08/2013  . Systolic murmur 49/70/2637  . Chronic constipation 04/08/2013  . Breast cancer, right breast ([place]) 02/21/2013    Orientation RESPIRATION BLADDER Height & Weight     Self, Time, Situation, Place  O2 Indwelling catheter Weight: 245 lb 13 oz (111.5 kg) Height:  5\' 6"  (167.6 cm)  BEHAVIORAL SYMPTOMS/MOOD NEUROLOGICAL BOWEL NUTRITION STATUS  Other (Comment) (no behaviors)   Incontinent Diet (Clear liquid- expect to progress)  AMBULATORY STATUS COMMUNICATION OF NEEDS Skin   Extensive Assist Verbally Normal                       Personal Care Assistance Level of Assistance  Bathing, Feeding, Dressing Bathing Assistance: Maximum assistance Feeding assistance: Limited assistance Dressing Assistance: Maximum assistance     Functional Limitations Info  Sight, Hearing, Speech Sight Info: Adequate Hearing Info: Adequate Speech Info: Adequate    SPECIAL CARE FACTORS FREQUENCY  PT (By licensed PT), OT (By licensed OT)     PT Frequency: 5x wk OT Frequency: 5x wk            Contractures Contractures Info: Not present  Additional Factors Info  Code Status, Psychotropic, Allergies Code Status Info: Partial Code   Psychotropic Info: Lexapro, ativan         Current Medications (12/09/2016):  This is the current hospital active medication list Current Facility-Administered Medications  Medication Dose Route Frequency Provider Last Rate Last Dose  . 0.9 %   sodium chloride infusion  250 mL Intravenous PRN Donita Brooks, NP 10 mL/hr at 12/08/16 1059 250 mL at 12/08/16 1059  . ALPRAZolam Duanne Moron) tablet 0.5 mg  0.5 mg Oral TID PRN Marshell Garfinkel, MD   0.5 mg at 12/08/16 2320  . carvedilol (COREG) tablet 3.125 mg  3.125 mg Oral BID WC Tawni Millers, MD   3.125 mg at 12/09/16 0835  . Chlorhexidine Gluconate Cloth 2 % PADS 6 each  6 each Topical Q0600 Mauricio Gerome Apley, MD   6 each at 12/06/16 0600  . diphenhydrAMINE (BENADRYL) capsule 25 mg  25 mg Oral QHS PRN Tawni Millers, MD   25 mg at 12/06/16 2018  . escitalopram (LEXAPRO) tablet 10 mg  10 mg Oral QHS Tawni Millers, MD   10 mg at 12/08/16 2205  . feeding supplement (BOOST / RESOURCE BREEZE) liquid 1 Container  1 Container Oral TID WC Mauricio Gerome Apley, MD   1 Container at 12/05/16 1558  . fentaNYL (SUBLIMAZE) injection 50 mcg  50 mcg Intravenous Once Donita Brooks, NP      . fluconazole (DIFLUCAN) tablet 100 mg  100 mg Oral Daily Florencia Reasons, MD      . furosemide (LASIX) injection 40 mg  40 mg Intravenous Daily Florencia Reasons, MD   40 mg at 12/08/16 1106  . hydrocortisone cream 1 %   Topical TID Florencia Reasons, MD      . ipratropium-albuterol (DUONEB) 0.5-2.5 (3) MG/3ML nebulizer solution 3 mL  3 mL Nebulization Q2H PRN Tawni Millers, MD   3 mL at 12/05/16 0925  . labetalol (NORMODYNE,TRANDATE) injection 10 mg  10 mg Intravenous Q4H PRN Raylene Miyamoto, MD   10 mg at 12/02/16 2054  . lactulose (CHRONULAC) 10 GM/15ML solution 30 g  30 g Oral TID Tawni Millers, MD   30 g at 12/08/16 2208  . lip balm (CARMEX) ointment   Topical PRN Juanito Doom, MD      . MEDLINE mouth rinse  15 mL Mouth Rinse BID Mauricio Gerome Apley, MD   15 mL at 12/08/16 2200  . morphine 4 MG/ML injection 1 mg  1 mg Intravenous Q3H PRN Tawni Millers, MD   1 mg at 12/09/16 0835  . multivitamin with minerals tablet 1 tablet  1 tablet Oral Daily Mauricio Gerome Apley, MD   1  tablet at 12/08/16 1111  . nystatin cream (MYCOSTATIN)   Topical BID Tawni Millers, MD      . ondansetron [facility]) injection 4 mg  4 mg Intravenous Q6H PRN Donita Brooks, NP   4 mg at 12/05/16 1837  . oxyCODONE (Oxy IR/ROXICODONE) immediate release tablet 5 mg  5 mg Oral Q3H PRN Gardiner Barefoot, NP   5 mg at 12/09/16 0608  . pantoprazole (PROTONIX) EC tablet 40 mg  40 mg Oral BID Tawni Millers, MD   40 mg at 12/08/16 2205  . phenol (CHLORASEPTIC) mouth spray 1 spray  1 spray Mouth/Throat PRN Tawni Millers, MD      . potassium chloride SA (K-DUR,KLOR-CON) CR tablet 40 mEq  40 mEq Oral  Q4H Florencia Reasons, MD         Discharge Medications: Please see discharge summary for a list of discharge medications.  Relevant Imaging Results:  Relevant Lab Results:   Additional Information SS # [id number]     12/09/16  Patient may require, at discharge, a medication called VERZENIO  (oral therapy treatment) Williemae Area, LCSW

## 2016-12-09 NOTE — Clinical Social Work Note (Signed)
Received v-mail from son- Legrand Como- He toured [facility] and confirmed acceptance of bed at this facility.  Notified Janie at [employer].  Plan d/c tomorrow to SNF if medically stable per MD.  Butch Penny T. Pauline Good, [place] (weekend coverage) '

## 2016-12-09 NOTE — Progress Notes (Signed)
PROGRESS NOTE    Alexandria [name]  NTZ:[id number] DOB: 09-06-1951 DOA: 11/23/2016 PCP: Nyoka Cowden, MD     Brief Narrative:  66 yo female with metastatic breast cancer, admitted to the intensive care on 11/23/16 due to shock associated to upper GI bleed, requiring invasive mechanical ventilation for airway protection. Frank hematemesis with abdominal pain. Endoscopy with ulcerated lesion at the GEJ, sp clips and epinephrine injection. Follow egd with no further bleeding. Liberated from mechanical ventilation on 03/15. Encephalopathy has been improving. Will need SNF. Resumed lactulose with improved mentation.     Assessment & Plan:   Active Problems:   HTN (hypertension), Chronic Diastolic HF, h/o HLD and CAD.    Acute upper GI bleed   Leukocytosis   Acute blood loss anemia, Coagulopathy & mild thrombocytopenia    Electrolyte imbalance   Acute hypoxemic respiratory failure ([place]) in setting of hemorrhagic shock. LLL atelectasis. Pulmonary edema.    Acute encephalopathy, both metabolic and hepatic    Abnormal liver enzymes. Had CT imaging c/w cirrhosis but Biopsy negative.    Abdominal pain   Upper GI bleed with acute blood loss anemia, massive gi bleed with hypovolemic shock/required intubation, icu admision s/p Nissen a few weeks ago She became hemodynamically unstable and intubated for airway protection Urgent EGD on 3/8: "Large ulceration at GEJ, with nodule as described above with active bleeding. Clips were placed.                            Injected with epinephrine. Unclear if complete hemostasis was achieved, visualization was poor                            given the amount of blood and clot in the lumen  - Blood noted throughout the examined stomach and                            small bowel with poor visualization" Repeat EGD on 3/10  "suspect patient had bleeding at large ulceration at surgical anastomosis. Nissen is not                            intact and  suspect bleeding from ulceration, and perhaps sutures were ruptured from patient's                            vomiting post-operatively. Given her underlying malignancy she is also at risk for poor wound                            healing. No active bleeding noted during this exam. She received prbc transfusion x2untis, and multple ffp transfusion during her stay in the ICU, she was treated with ppi drip then bid pantoprazole.  She is extubated on 3/15 and transferred to hospitalist service on 3/17, transferred out of icu on 3/22 Diet as tolerated. Hb and hct have remained stable. She received iv ironx1 on 3/22 per hematology/oncology  I have discussed with GI  On 3/23 regarding follow up, GI will see patient in the clinic, may repeat egd at follow up.  Metabolic encephalopathy/ hepatic porto-systemic encephalopathy/ICU delirium? 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   continue with tid lactulosa, monitor ammonia level.  Continue as needed alprazolam and qhslexapro.  Mentation has improved, still slow in answering question, but aaox3,   Acute on chronic diastolic heart failure.  Continue coreg, holding on  Lisinopril due to borderline bp Resume diuretics due to bilateral lower extremity edema, total of 8.7liter +fluids balance as of 3/21, elevate legs, d/c ivf, start lasix 40mg  iv daily from 3/21 Echo lvef 09%-60%, grade 1 diastolic function /venous ultrasound no DVT. Currently no chest pain, on room air  Hypokalemia: replace k  Metastatic breast cancer with liver mets. Oncology Dr Marin Olp started her on monthly hormonal therapy with Faslodex, first dose given on 3/14 .  Dr Marin Olp also planning to start her on cell cycle check point inhibitor [facility] outpatient  Appreciate oncology input.  HTN.  Continue coreg, hold lisinopril due to borderline bp, continue diuese if bp allows  Yeast cystitis  Urine dark with sediment, ua with large leuk, wbc TNTC, RBC TNTC,  she is started on augmentin, urine culture+ yeast, d/c augmentin, start dilfucan no fever, no leukocytosis. Remove foley removed on 3/23 central line removed on 3/23   skin  Rash on abdominal pannus, continue nystatin topical and topical steroids, oral diflucan    DVT prophylaxis:scd Code Status:Patient changed to full code on 3/23 Family Communication:patient and son over the phone on 3/22 Disposition Plan:SNF, hopefully on 3/25   Consultants:  GI   Oncology  General surgery  icu /critical care admission ,TRH transfer on 3/17  Procedures: Upper endoscopy 03/8 and 03/10. Intubation on 3/8, extubation on 3/15 Right IJ placement on 3/8, right IJ removed on 3/22 prbc transfusion on 3/8 and 3/10 FFP transfusion on 3/8, 3/9 , 3/10 and 3/11    Antimicrobials:  rocephin from 3/9 to 3/11 augmentin from 3/21 to 3/23  Subjective: aaox3, still slow in talking,less edematous, c/o itchy  And painful irritated skin on pannus and bilateral groin Report feeling dizzy yesterday, did not want to get up, but states today she will try to get up  Objective: Vitals:   12/08/16 1643 12/08/16 2028 12/09/16 0443 12/09/16 0512  BP:  136/67 125/70   Pulse:  86 83   Resp:  18 19   Temp:  98.7 F (37.1 C) 98.3 F (36.8 C)   TempSrc:  Oral Oral   SpO2: 95% 98% 97%   Weight:    111.5 kg (245 lb 13 oz)  Height:        Intake/Output Summary (Last 24 hours) at 12/09/16 1014 Last data filed at 12/09/16 0600  Gross per 24 hour  Intake              680 ml  Output                0 ml  Net              680 ml   Filed Weights   12/07/16 0500 12/08/16 0555 12/09/16 0512  Weight: 115.1 kg (253 lb 12 oz) 104.7 kg (230 lb 12.8 oz) 111.5 kg (245 lb 13 oz)    Examination:  General exam: deconditioned, aaox3  this am, but still slow in answering questions E ENT: mild pallor, oral mucosa dry.  Respiratory system: diminished at basis,  No wheezing or rales.  Cardiovascular system: S1 & S2 heard, RRR. No JVD, murmurs, rubs,  gallops or clicks. + bilateral lower extremity pitting edema. Gastrointestinal system: Abdomen is nondistended, soft and nontender. No organomegaly or masses felt. Normal bowel sounds heard. Lower abdominal wall edema Central nervous system: Alert and oriented. No focal neurological deficits. Extremities: Symmetric 5 x 5 power.  + bilateral lower extremity pitting edema. Skin: Rash in the lower abdominal skin fold.     Data Reviewed: I have personally reviewed following labs and imaging studies  CBC:  Recent Labs Lab 12/03/16 0456 12/04/16 0500 12/06/16 0459 12/07/16 0500 12/08/16 0502  WBC 12.1* 7.8 8.8 9.4 7.1  NEUTROABS 6.8 4.4 4.8 5.5 3.7  HGB 9.3* 8.2* 9.1* 9.2* 9.3*  HCT 27.6* 24.7* 27.0* 27.5* 27.7*  MCV 80.0 79.9 79.4 79.5 79.6  PLT 227 153 174 145* 485*   Basic Metabolic Panel:  Recent Labs Lab 12/04/16 0500 12/05/16 0432 12/06/16 0459 12/07/16 0500 12/08/16 0502 12/09/16 0539  NA 142 142 141 140 140 138  K 3.1* 3.0* 3.6 3.6 3.5 3.1*  CL 115* 113* 114* 108 107 101  CO2 23 22 20* 22 23 26   GLUCOSE 116* 113* 117* 109* 105* 114*  BUN 11 10 8 10 11 10   CREATININE 0.59 0.67 0.72 0.79 0.73 0.84  CALCIUM 8.1* 8.1* 8.3* 8.4* 8.3* 8.7*  MG 1.9 1.9 1.7 1.7 1.6* 2.0  PHOS 2.4* 2.4* 2.4* 3.1 2.9  --    GFR: Estimated Creatinine Clearance: 86.8 mL/min (by C-G formula based on SCr of 0.84 mg/dL). Liver Function Tests:  Recent Labs Lab 12/05/16 0432 12/06/16 0459 12/07/16 0500 12/08/16 0502 12/09/16 0539  AST  --   --  213*  --  266*  ALT  --   --  72*  --  89*  ALKPHOS  --   --  136*  --  162*  BILITOT  --   --  6.4*  --  6.6*  PROT  --   --  6.1*  --  6.5  ALBUMIN 1.9* 1.9* 2.0* 1.9* 2.1*   No results for input(s): LIPASE, AMYLASE in the  last 168 hours.  Recent Labs Lab 12/04/16 0500 12/05/16 0432 12/06/16 0459 12/08/16 0502 12/09/16 0539  AMMONIA 54* 58* 46* 96* 51*   Coagulation Profile:  Recent Labs Lab 12/03/16 0456 12/04/16 0500 12/05/16 0432 12/06/16 0459  INR 1.29 1.43 1.28 1.33   Cardiac Enzymes: No results for input(s): CKTOTAL, CKMB, CKMBINDEX, TROPONINI in the last 168 hours. BNP (last 3 results) No results for input(s): PROBNP in the last 8760 hours. HbA1C: No results for input(s): HGBA1C in the last 72 hours. CBG:  Recent Labs Lab 12/03/16 1710  GLUCAP 120*   Lipid Profile:  Recent Labs  12/09/16 0539  CHOL 93  HDL <10*  LDLCALC NOT CALCULATED  TRIG 185*  CHOLHDL NOT CALCULATED   Thyroid Function Tests: No results for input(s): TSH, T4TOTAL, FREET4, T3FREE, THYROIDAB in the last 72 hours. Anemia Panel: No results for input(s): VITAMINB12, FOLATE, FERRITIN, TIBC, IRON, RETICCTPCT in the last 72 hours. Sepsis Labs: No results for input(s): PROCALCITON, LATICACIDVEN in the last 168 hours.  Recent Results (from the past 240 hour(s))  Culture, Urine     Status: Abnormal   Collection Time: 12/06/16  6:50 PM  Result Value Ref Range Status   Specimen Description URINE, RANDOM  Final   Special Requests NONE  Final   Culture >=100,000 COLONIES/mL YEAST (A)  Final   Report Status 12/08/2016 FINAL  Final         Radiology  Studies: No results found.      Scheduled Meds: . carvedilol  3.125 mg Oral BID WC  . Chlorhexidine Gluconate Cloth  6 each Topical Q0600  . escitalopram  10 mg Oral QHS  . feeding supplement  1 Container Oral TID WC  . fentaNYL (SUBLIMAZE) injection  50 mcg Intravenous Once  . fluconazole  100 mg Oral Daily  . furosemide  40 mg Intravenous Daily  . hydrocortisone cream   Topical TID  . lactulose  30 g Oral TID  . mouth rinse  15 mL Mouth Rinse BID  . multivitamin with minerals  1 tablet Oral Daily  . nystatin cream   Topical BID  . pantoprazole   40 mg Oral BID  . potassium chloride  40 mEq Oral Q4H   Continuous Infusions:    LOS: 16 days    Time spent>1mins   Shrika Milos, MD PhD [facility] Pager [phone]  If 7PM-7AM, please contact night-coverage [email or web address] Password [email or web address] 12/09/2016, 10:14 AM

## 2016-12-09 NOTE — Progress Notes (Signed)
Alexandria [name] seems to be doing a little better. She seems to be fairly alert. She still is not eating all that much. She said that she threw up yesterday.  Her liver tests are similar. Her ammonia level is down. She is on lactulose.  She is due for her next dose of Faslodex next week.  I am really not sure if she really is getting out of bed all that much.  Her other labs look okay. There is no CBC at back today. Her potassium is down a little bit.  Her vital signs all look pretty stable. Her temperature is 98.3. Blood pressure 125/70.  Her lungs are clear bilaterally. Cardiac exam regular rate and rhythm with no murmurs, rubs or bruits. Abdomen is obese but soft. Bowel sounds are present. There is some diffuse tenderness to palpation. Extremities shows some trace edema in her legs. Neurological exam shows no obvious neurological deficits.  I think the goal is to try to get her to skilled nursing or rehabilitation. I know this is going to be tough.  We will have to keep watching her liver tests. She has underlying cirrhosis. I'm sure that the breast cancer is not helping.  We really cannot use systemic chemotherapy. She really is in no shape to take systemic chemotherapy. I am trying to get the oral therapy, Verzenio, but I'm not sure when this will be available.  She is getting fantastic care from everybody up on [address]. I can see the improvement in her.  [name], MD  Psalm 54:4

## 2016-12-10 ENCOUNTER — Other Ambulatory Visit: Payer: Self-pay | Admitting: Hematology & Oncology

## 2016-12-10 DIAGNOSIS — B3741 Candidal cystitis and urethritis: Secondary | ICD-10-CM

## 2016-12-10 DIAGNOSIS — C50919 Malignant neoplasm of unspecified site of unspecified female breast: Secondary | ICD-10-CM

## 2016-12-10 DIAGNOSIS — R627 Adult failure to thrive: Secondary | ICD-10-CM

## 2016-12-10 LAB — CBC WITH DIFFERENTIAL/PLATELET
BASOS ABS: 0 10*3/uL (ref 0.0–0.1)
Basophils Relative: 0 %
Eosinophils Absolute: 0.4 10*3/uL (ref 0.0–0.7)
Eosinophils Relative: 5 %
HEMATOCRIT: 28.5 % — AB (ref 36.0–46.0)
HEMOGLOBIN: 9.4 g/dL — AB (ref 12.0–15.0)
LYMPHS PCT: 25 %
Lymphs Abs: 2.1 10*3/uL (ref 0.7–4.0)
MCH: 26.3 pg (ref 26.0–34.0)
MCHC: 33 g/dL (ref 30.0–36.0)
MCV: 79.6 fL (ref 78.0–100.0)
MONO ABS: 1.5 10*3/uL — AB (ref 0.1–1.0)
MONOS PCT: 18 %
NEUTROS ABS: 4.5 10*3/uL (ref 1.7–7.7)
Neutrophils Relative %: 52 %
Platelets: 144 10*3/uL — ABNORMAL LOW (ref 150–400)
RBC: 3.58 MIL/uL — AB (ref 3.87–5.11)
RDW: 18.9 % — ABNORMAL HIGH (ref 11.5–15.5)
WBC: 8.6 10*3/uL (ref 4.0–10.5)

## 2016-12-10 LAB — COMPREHENSIVE METABOLIC PANEL
ALBUMIN: 1.9 g/dL — AB (ref 3.5–5.0)
ALT: 86 U/L — AB (ref 14–54)
AST: 246 U/L — AB (ref 15–41)
Alkaline Phosphatase: 151 U/L — ABNORMAL HIGH (ref 38–126)
Anion gap: 9 (ref 5–15)
BILIRUBIN TOTAL: 5.8 mg/dL — AB (ref 0.3–1.2)
BUN: 11 mg/dL (ref 6–20)
CHLORIDE: 104 mmol/L (ref 101–111)
CO2: 25 mmol/L (ref 22–32)
CREATININE: 0.86 mg/dL (ref 0.44–1.00)
Calcium: 8.6 mg/dL — ABNORMAL LOW (ref 8.9–10.3)
GFR calc Af Amer: >60 mL/min (ref >60)
GLUCOSE: 109 mg/dL — AB (ref 65–99)
POTASSIUM: 3.2 mmol/L — AB (ref 3.5–5.1)
Sodium: 138 mmol/L (ref 135–145)
TOTAL PROTEIN: 6.1 g/dL — AB (ref 6.5–8.1)

## 2016-12-10 LAB — MAGNESIUM: MAGNESIUM: 1.8 mg/dL (ref 1.7–2.4)

## 2016-12-10 LAB — APTT: aPTT: 47 seconds — ABNORMAL HIGH (ref 24–36)

## 2016-12-10 LAB — AMMONIA: AMMONIA: 55 umol/L — AB (ref 9–35)

## 2016-12-10 MED ORDER — FUROSEMIDE 40 MG PO TABS
40.0000 mg | ORAL_TABLET | Freq: Once | ORAL | Status: AC
  Administered 2016-12-10: 40 mg via ORAL
  Filled 2016-12-10: qty 1

## 2016-12-10 MED ORDER — BOOST / RESOURCE BREEZE PO LIQD: 1.0000 | Freq: Three times a day (TID) | ORAL | 0 refills | Status: AC

## 2016-12-10 MED ORDER — POTASSIUM CHLORIDE CRYS ER 20 MEQ PO TBCR
40.0000 meq | EXTENDED_RELEASE_TABLET | Freq: Once | ORAL | Status: AC
  Administered 2016-12-10: 40 meq via ORAL
  Filled 2016-12-10: qty 2

## 2016-12-10 MED ORDER — FUROSEMIDE 40 MG PO TABS: 40.0000 mg | ORAL_TABLET | ORAL | 0 refills | Status: DC

## 2016-12-10 MED ORDER — FUROSEMIDE 40 MG PO TABS
40.0000 mg | ORAL_TABLET | Freq: Every day | ORAL | 0 refills | Status: DC
Start: 2016-12-10 — End: 2016-12-11

## 2016-12-10 MED ORDER — FUROSEMIDE 40 MG PO TABS
40.0000 mg | ORAL_TABLET | ORAL | Status: DC
  Administered 2016-12-11: 40 mg via ORAL
  Filled 2016-12-10: qty 1

## 2016-12-10 MED ORDER — AMOXICILLIN-POT CLAVULANATE 875-125 MG PO TABS
1.0000 | ORAL_TABLET | Freq: Two times a day (BID) | ORAL | Status: DC
  Administered 2016-12-10 – 2016-12-11 (×2): 1 via ORAL
  Filled 2016-12-10 (×2): qty 1

## 2016-12-10 MED ORDER — POTASSIUM CHLORIDE CRYS ER 20 MEQ PO TBCR: 40.0000 meq | EXTENDED_RELEASE_TABLET | Freq: Every day | ORAL | 0 refills | Status: AC

## 2016-12-10 NOTE — Progress Notes (Addendum)
PROGRESS NOTE    HARBOUR NORDMEYER  GGE:[id number] DOB: 06-Dec-1950 DOA: 11/23/2016 PCP: Nyoka Cowden, MD     Brief Narrative:  66 yo female with metastatic breast cancer, admitted to the intensive care on 11/23/16 due to shock associated to upper GI bleed, requiring invasive mechanical ventilation for airway protection. Frank hematemesis with abdominal pain. Endoscopy with ulcerated lesion at the GEJ, sp clips and epinephrine injection. Follow egd with no further bleeding. Liberated from mechanical ventilation on 03/15. Encephalopathy has been improving. Will need SNF. Resumed lactulose with improved mentation.     Assessment & Plan:   Active Problems:   HTN (hypertension), Chronic Diastolic HF, h/o HLD and CAD.    Acute upper GI bleed   Leukocytosis   Acute blood loss anemia, Coagulopathy & mild thrombocytopenia    Electrolyte imbalance   Acute hypoxemic respiratory failure ([place]) in setting of hemorrhagic shock. LLL atelectasis. Pulmonary edema.    Acute encephalopathy, both metabolic and hepatic    Abnormal liver enzymes. Had CT imaging c/w cirrhosis but Biopsy negative.    Abdominal pain   Upper GI bleed with acute blood loss anemia, massive gi bleed with hypovolemic shock/required intubation, icu admision s/p Nissen a few weeks ago She became hemodynamically unstable and intubated for airway protection Urgent EGD on 3/8: "Large ulceration at GEJ, with nodule as described above with active bleeding. Clips were placed.                            Injected with epinephrine. Unclear if complete hemostasis was achieved, visualization was poor                            given the amount of blood and clot in the lumen  - Blood noted throughout the examined stomach and                            small bowel with poor visualization" Repeat EGD on 3/10  "suspect patient had bleeding at large ulceration at surgical anastomosis. Nissen is not                            intact and  suspect bleeding from ulceration, and perhaps sutures were ruptured from patient's                            vomiting post-operatively. Given her underlying malignancy she is also at risk for poor wound                            healing. No active bleeding noted during this exam. She received prbc transfusion x2untis, and multple ffp transfusion during her stay in the ICU, she was treated with ppi drip then bid pantoprazole.  She is extubated on 3/15 and transferred to hospitalist service on 3/17, transferred out of icu on 3/22 Diet as tolerated. Hb and hct have remained stable. She received iv ironx1 on 3/22 per hematology/oncology  I have discussed with GI  On 3/23 regarding follow up, GI will see patient in the clinic, may repeat egd at follow up.  Metabolic encephalopathy/ hepatic porto-systemic encephalopathy/ICU delirium? 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   continue with tid lactulosa, monitor ammonia level.  Continue as needed alprazolam and qhslexapro.  Mentation has improved, still slow in answering question, but aaox3,   Acute on chronic diastolic heart failure.  Continue coreg, holding on  Lisinopril due to borderline bp Resume diuretics due to bilateral lower extremity edema, total of 8.7liter +fluids balance as of 3/21, elevate legs, d/c ivf, start lasix 40mg  iv daily from 3/21-3/25, changed to oral lasix every mWF at discharge Echo lvef 47%-42%, grade 1 diastolic function /venous ultrasound no DVT. Currently no chest pain, on room air  Hypokalemia: replace k   HTN. Continue coreg, hold lisinopril due to borderline bp, continue diuese if bp allows  Yeast cystitis  Urine dark with sediment, ua with large leuk, wbc TNTC, RBC TNTC,  she is started on augmentin, urine culture+ yeast,  d/c augmentin, start dilfucan no fever, no leukocytosis. Remove foley removed on 3/23 central line removed on 3/23   skin  Rash on abdominal pannus, continue nystatin topical and topical steroids, oral diflucan Skin looks inflamed and irritated, will add augmentin to cover superimposed bacterial infection.  Metastatic breast cancer with liver mets. Oncology Dr Marin Olp started her on monthly hormonal therapy with Faslodex, first dose given on 3/14 .  Dr Marin Olp also planning to start her on cell cycle check point inhibitor [facility] outpatient  Appreciate oncology input.   Body mass index is 39.89 kg/m.   DVT prophylaxis:scd Code Status:Patient changed to full code on 3/23 Family Communication:patient and son at bedside on 3/25 Disposition Plan:SNF on 3/26   Consultants:  GI   Oncology  General surgery  icu /critical care admission ,TRH transfer on 3/17  Procedures: Upper endoscopy 03/8 and 03/10. Intubation on 3/8, extubation on 3/15 Right IJ placement on 3/8, right IJ removed on 3/22 prbc transfusion on 3/8 and 3/10 FFP transfusion on 3/8, 3/9 , 3/10 and 3/11    Antimicrobials:  rocephin from 3/9 to 3/11 augmentin from 3/21 to 3/23  Subjective: aaox3, still slow in talking,less edematous, c/o itchy  And painful irritated skin on pannus and bilateral groin   Objective: Vitals:   12/09/16 1336 12/09/16 2157 12/10/16 0632 12/10/16 1519  BP: 130/77 111/64 118/63 107/74  Pulse: 88 88 88 83  Resp: 16 18 18 18   Temp: 98.5 F (36.9 C) 98.8 F (37.1 C) 98 F (36.7 C) 99 F (37.2 C)  TempSrc: Oral Oral Oral Oral  SpO2: 98% 96% 95% 92%  Weight:   112.1 kg (247 lb 2.2 oz)   Height:        Intake/Output Summary (Last 24 hours) at 12/10/16 1644 Last data filed at 12/09/16 2200  Gross per 24 hour  Intake              280 ml  Output                0 ml  Net              280 ml   Filed Weights   12/08/16 0555 12/09/16 0512 12/10/16 5956    Weight: 104.7 kg (230 lb 12.8 oz) 111.5 kg (245 lb 13 oz) 112.1 kg (247 lb 2.2 oz)    Examination:  General exam: deconditioned, aaox3 this am, but still slow in answering questions E ENT: mild pallor, oral mucosa dry.  Respiratory system: diminished at basis,  No wheezing or rales.  Cardiovascular system: S1 & S2 heard, RRR. No JVD, murmurs, rubs, gallops or clicks. + bilateral lower extremity pitting edema has much improved Gastrointestinal system: Abdomen is nondistended, soft and nontender. No organomegaly or masses felt. Normal bowel sounds heard. Lower abdominal wall edema/irritated and itchy around abdominal pannus Central nervous system: Alert and oriented. No focal neurological deficits. Extremities: Symmetric 5 x 5 power.  + bilateral lower extremity pitting edema improved. Skin: Rash in the lower abdominal skin fold.     Data Reviewed: I have personally reviewed following labs and imaging studies  CBC:  Recent Labs Lab 12/04/16 0500 12/06/16 0459 12/07/16 0500 12/08/16 0502 12/10/16 0453  WBC 7.8 8.8 9.4 7.1 8.6  NEUTROABS 4.4 4.8 5.5 3.7 4.5  HGB 8.2* 9.1* 9.2* 9.3* 9.4*  HCT 24.7* 27.0* 27.5* 27.7* 28.5*  MCV 79.9 79.4 79.5 79.6 79.6  PLT 153 174 145* 138* 644*   Basic Metabolic Panel:  Recent Labs Lab 12/04/16 0500 12/05/16 0432 12/06/16 0459 12/07/16 0500 12/08/16 0502 12/09/16 0539 12/10/16 0453  NA 142 142 141 140 140 138 138  K 3.1* 3.0* 3.6 3.6 3.5 3.1* 3.2*  CL 115* 113* 114* 108 107 101 104  CO2 23 22 20* 22 23 26 25   GLUCOSE 116* 113* 117* 109* 105* 114* 109*  BUN 11 10 8 10 11 10 11   CREATININE 0.59 0.67 0.72 0.79 0.73 0.84 0.86  CALCIUM 8.1* 8.1* 8.3* 8.4* 8.3* 8.7* 8.6*  MG 1.9 1.9 1.7 1.7 1.6* 2.0 1.8  PHOS 2.4* 2.4* 2.4* 3.1 2.9  --   --    GFR: Estimated Creatinine Clearance: 85 mL/min (by C-G formula based on SCr of 0.86 mg/dL). Liver Function Tests:  Recent Labs Lab 12/06/16 0459 12/07/16 0500 12/08/16 0502 12/09/16 0539  12/10/16 0453  AST  --  213*  --  266* 246*  ALT  --  72*  --  89* 86*  ALKPHOS  --  136*  --  162* 151*  BILITOT  --  6.4*  --  6.6* 5.8*  PROT  --  6.1*  --  6.5 6.1*  ALBUMIN 1.9* 2.0* 1.9* 2.1* 1.9*   No results for input(s): LIPASE, AMYLASE in the last 168 hours.  Recent Labs Lab 12/05/16 0432 12/06/16 0459 12/08/16 0502 12/09/16 0539 12/10/16 0453  AMMONIA 58* 46* 96* 51* 55*   Coagulation Profile:  Recent Labs Lab 12/04/16 0500 12/05/16 0432 12/06/16 0459  INR 1.43 1.28 1.33   Cardiac Enzymes: No results for input(s): CKTOTAL, CKMB, CKMBINDEX, TROPONINI in the last 168 hours. BNP (last 3 results) No results for input(s): PROBNP in the last 8760 hours. HbA1C: No results for input(s): HGBA1C in the last 72 hours. CBG:  Recent Labs Lab 12/03/16 1710  GLUCAP 120*   Lipid Profile:  Recent Labs  12/09/16 0539  CHOL 93  HDL <10*  LDLCALC NOT CALCULATED  TRIG 185*  CHOLHDL NOT CALCULATED   Thyroid Function Tests: No results for input(s): TSH, T4TOTAL, FREET4, T3FREE, THYROIDAB in the last 72 hours. Anemia Panel: No results for input(s): VITAMINB12, FOLATE, FERRITIN, TIBC, IRON, RETICCTPCT in the last 72 hours. Sepsis Labs: No results for input(s): PROCALCITON, LATICACIDVEN in the last 168 hours.  Recent Results (from the past 240 hour(s))  Culture, Urine     Status: Abnormal   Collection Time: 12/06/16  6:50 PM  Result Value Ref Range Status   Specimen Description URINE, RANDOM  Final   Special Requests NONE  Final  Culture >=100,000 COLONIES/mL YEAST (A)  Final   Report Status 12/08/2016 FINAL  Final         Radiology Studies: No results found.      Scheduled Meds: . carvedilol  3.125 mg Oral BID WC  . Chlorhexidine Gluconate Cloth  6 each Topical Q0600  . escitalopram  10 mg Oral QHS  . feeding supplement  1 Container Oral TID WC  . fentaNYL (SUBLIMAZE) injection  50 mcg Intravenous Once  . fluconazole  100 mg Oral Daily  .  [START ON 12/11/2016] furosemide  40 mg Oral Q M,W,F  . furosemide  40 mg Oral Once  . hydrocortisone cream   Topical TID  . lactulose  30 g Oral TID  . mouth rinse  15 mL Mouth Rinse BID  . multivitamin with minerals  1 tablet Oral Daily  . nystatin cream   Topical BID  . pantoprazole  40 mg Oral BID   Continuous Infusions:    LOS: 17 days    Time spent>89mins   Rajeev Escue, MD PhD [facility] Pager [phone]  If 7PM-7AM, please contact night-coverage [email or web address] Password [facility] 12/10/2016, 4:44 PM

## 2016-12-10 NOTE — Clinical Social Work Placement (Signed)
   CLINICAL SOCIAL WORK PLACEMENT  NOTE  Date:  12/10/2016  Patient Details  Name: Alexandria [name] MRN: [id number] Date of Birth: July 06, 1951  Clinical Social Work is seeking post-discharge placement for this patient at the [place] level of care (*CSW will initial, date and re-position this form in  chart as items are completed):  Yes   Patient/family provided with [place] Work Department's list of facilities offering this level of care within the geographic area requested by the patient (or if unable, by the patient's family).  Yes   Patient/family informed of their freedom to choose among providers that offer the needed level of care, that participate in Medicare, Medicaid or managed care program needed by the patient, have an available bed and are willing to accept the patient.  Yes   Patient/family informed of [facility] [place]'s ownership interest in [facility] and [facility], as well as of the fact that they are under no obligation to receive care at these facilities.  PASRR submitted to EDS on 12/04/16     PASRR number received on 12/04/16     Existing PASRR number confirmed on       FL2 transmitted to all facilities in geographic area requested by pt/family on 12/04/16     FL2 transmitted to all facilities within larger geographic area on       Patient informed that his/her managed care company has contracts with or will negotiate with certain facilities, including the following:        Yes   Patient/family informed of bed offers received.  Patient chooses bed at [facility]     Physician recommends and patient chooses bed at      Patient to be transferred to [facility] on 12/11/16.  Patient to be transferred to facility by       Patient family notified on   of transfer.  Name of family member notified:        PHYSICIAN       Additional Comment:     _______________________________________________ Truitt Merle, LCSW 12/10/2016, 1:14 PM

## 2016-12-10 NOTE — Clinical Social Work Note (Signed)
CSW spoke with Dr. Erlinda Hong, who asked that [facility] call the son to see if ready for pt d/c. CSW spoke with son-Alexandria [name] who confirmed he was told d/c would be tomorrow and would like to leave d/c for tomorrow. CSW spoke with Dr. Erlinda Hong and informed of son's expectation. Dr. Erlinda Hong agreed to d/c tomorrow. CSW informed Janie (admissions) at [facility] also. CSW updated placement note and accepted Ritta Slot in the hub. Unit CSW will continue to follow for d/c needs.   Oretha Ellis, [place], [place] Weekend CSW [phone]

## 2016-12-10 NOTE — Progress Notes (Signed)
Alexandria [name] looks pretty good this morning. She was supposed to be discharged yesterday, I thought. However, it looks like she will be going tomorrow.  She said that she did walk a little bit yesterday. I'm unsure of this truly happened.  She is not complaining of as much pain. It looks that she did have a little bit of breakfast.  Her liver tests are improving. Her ammonia is stable at 55.  I suspect that she probably has quite a bit of underlying cirrhosis. Having the breast cancer in the liver is not helping.  She is due for her next injection of Faslodex on the 28th. I'll have to make sure that she comes the office for this.  She's had no bleeding. Her hemoglobin stable at 9.4.  Her albumin is quite low at 1.9. This, I think, reflects her poor liver function. And I think this is not a reflection of her malignancy.  On her physical exam, nothing is really changed. Her temperature is 90.8. Pulse is 88. Blood pressure 118/63. Her lungs sound clear. Oral exam shows no thrush. Cardiac exam regular rate and rhythm. Abdomen is obese but soft. I don't really detect less tenderness to palpation. Extremities shows some trace edema in her legs. Neurological exam shows no focal neurological deficits.  Hopefully, Alexandria [name] will be discharged tomorrow.  As always, she wanted me to pray for her. We had a very good prayer session. I'm grateful I can do this for her as it does make her feel a lot better.  [name], MD  [place] 21:9

## 2016-12-11 ENCOUNTER — Telehealth: Payer: Self-pay | Admitting: Hematology & Oncology

## 2016-12-11 DIAGNOSIS — K7460 Unspecified cirrhosis of liver: Secondary | ICD-10-CM | POA: Diagnosis not present

## 2016-12-11 DIAGNOSIS — R278 Other lack of coordination: Secondary | ICD-10-CM | POA: Diagnosis not present

## 2016-12-11 DIAGNOSIS — R1110 Vomiting, unspecified: Secondary | ICD-10-CM | POA: Diagnosis not present

## 2016-12-11 DIAGNOSIS — K7200 Acute and subacute hepatic failure without coma: Secondary | ICD-10-CM

## 2016-12-11 DIAGNOSIS — I10 Essential (primary) hypertension: Secondary | ICD-10-CM | POA: Diagnosis not present

## 2016-12-11 DIAGNOSIS — K922 Gastrointestinal hemorrhage, unspecified: Secondary | ICD-10-CM | POA: Diagnosis not present

## 2016-12-11 DIAGNOSIS — R748 Abnormal levels of other serum enzymes: Secondary | ICD-10-CM | POA: Diagnosis not present

## 2016-12-11 DIAGNOSIS — C50919 Malignant neoplasm of unspecified site of unspecified female breast: Secondary | ICD-10-CM | POA: Diagnosis not present

## 2016-12-11 DIAGNOSIS — Z5111 Encounter for antineoplastic chemotherapy: Secondary | ICD-10-CM | POA: Diagnosis not present

## 2016-12-11 DIAGNOSIS — R109 Unspecified abdominal pain: Secondary | ICD-10-CM

## 2016-12-11 DIAGNOSIS — I509 Heart failure, unspecified: Secondary | ICD-10-CM | POA: Diagnosis not present

## 2016-12-11 DIAGNOSIS — K264 Chronic or unspecified duodenal ulcer with hemorrhage: Secondary | ICD-10-CM | POA: Diagnosis not present

## 2016-12-11 DIAGNOSIS — C787 Secondary malignant neoplasm of liver and intrahepatic bile duct: Secondary | ICD-10-CM | POA: Diagnosis not present

## 2016-12-11 DIAGNOSIS — R7989 Other specified abnormal findings of blood chemistry: Secondary | ICD-10-CM | POA: Diagnosis not present

## 2016-12-11 DIAGNOSIS — K219 Gastro-esophageal reflux disease without esophagitis: Secondary | ICD-10-CM | POA: Diagnosis not present

## 2016-12-11 DIAGNOSIS — G9340 Encephalopathy, unspecified: Secondary | ICD-10-CM | POA: Diagnosis not present

## 2016-12-11 DIAGNOSIS — R17 Unspecified jaundice: Secondary | ICD-10-CM | POA: Diagnosis not present

## 2016-12-11 DIAGNOSIS — D62 Acute posthemorrhagic anemia: Secondary | ICD-10-CM | POA: Diagnosis not present

## 2016-12-11 DIAGNOSIS — R2689 Other abnormalities of gait and mobility: Secondary | ICD-10-CM | POA: Diagnosis not present

## 2016-12-11 DIAGNOSIS — R571 Hypovolemic shock: Secondary | ICD-10-CM | POA: Diagnosis not present

## 2016-12-11 DIAGNOSIS — D509 Iron deficiency anemia, unspecified: Secondary | ICD-10-CM | POA: Diagnosis not present

## 2016-12-11 DIAGNOSIS — J9600 Acute respiratory failure, unspecified whether with hypoxia or hypercapnia: Secondary | ICD-10-CM | POA: Diagnosis not present

## 2016-12-11 DIAGNOSIS — I5033 Acute on chronic diastolic (congestive) heart failure: Secondary | ICD-10-CM | POA: Diagnosis not present

## 2016-12-11 DIAGNOSIS — C50419 Malignant neoplasm of upper-outer quadrant of unspecified female breast: Secondary | ICD-10-CM | POA: Diagnosis not present

## 2016-12-11 DIAGNOSIS — J9601 Acute respiratory failure with hypoxia: Secondary | ICD-10-CM | POA: Diagnosis not present

## 2016-12-11 DIAGNOSIS — M6281 Muscle weakness (generalized): Secondary | ICD-10-CM | POA: Diagnosis not present

## 2016-12-11 LAB — COMPREHENSIVE METABOLIC PANEL
ALK PHOS: 157 U/L — AB (ref 38–126)
ALT: 84 U/L — AB (ref 14–54)
ANION GAP: 8 (ref 5–15)
AST: 251 U/L — ABNORMAL HIGH (ref 15–41)
Albumin: 2 g/dL — ABNORMAL LOW (ref 3.5–5.0)
BUN: 12 mg/dL (ref 6–20)
CALCIUM: 8.6 mg/dL — AB (ref 8.9–10.3)
CO2: 25 mmol/L (ref 22–32)
CREATININE: 0.69 mg/dL (ref 0.44–1.00)
Chloride: 106 mmol/L (ref 101–111)
Glucose, Bld: 109 mg/dL — ABNORMAL HIGH (ref 65–99)
Potassium: 3.4 mmol/L — ABNORMAL LOW (ref 3.5–5.1)
Sodium: 139 mmol/L (ref 135–145)
TOTAL PROTEIN: 6.2 g/dL — AB (ref 6.5–8.1)
Total Bilirubin: 5.9 mg/dL — ABNORMAL HIGH (ref 0.3–1.2)

## 2016-12-11 LAB — MAGNESIUM: Magnesium: 1.9 mg/dL (ref 1.7–2.4)

## 2016-12-11 LAB — LIPID PANEL
CHOLESTEROL: 87 mg/dL (ref 0–200)
HDL: <10 mg/dL — ABNORMAL LOW (ref >40)
Triglycerides: 159 mg/dL — ABNORMAL HIGH (ref <150)
VLDL: 32 mg/dL (ref 0–40)

## 2016-12-11 LAB — AMMONIA: AMMONIA: 39 umol/L — AB (ref 9–35)

## 2016-12-11 LAB — APTT: aPTT: 43 seconds — ABNORMAL HIGH (ref 24–36)

## 2016-12-11 MED ORDER — POTASSIUM CHLORIDE CRYS ER 20 MEQ PO TBCR
40.0000 meq | EXTENDED_RELEASE_TABLET | Freq: Once | ORAL | Status: AC
  Administered 2016-12-11: 40 meq via ORAL
  Filled 2016-12-11: qty 2

## 2016-12-11 MED ORDER — NYSTATIN 100000 UNIT/GM EX CREA: TOPICAL_CREAM | Freq: Two times a day (BID) | CUTANEOUS | 0 refills | Status: AC

## 2016-12-11 MED ORDER — FUROSEMIDE 40 MG PO TABS: 40.0000 mg | ORAL_TABLET | Freq: Two times a day (BID) | ORAL | 2 refills | Status: AC

## 2016-12-11 MED ORDER — FLUCONAZOLE 100 MG PO TABS: 100.0000 mg | ORAL_TABLET | Freq: Every day | ORAL | 0 refills | Status: AC

## 2016-12-11 MED ORDER — AMOXICILLIN-POT CLAVULANATE 875-125 MG PO TABS: 1.0000 | ORAL_TABLET | Freq: Two times a day (BID) | ORAL | 0 refills | Status: AC

## 2016-12-11 MED ORDER — LACTULOSE 10 GM/15ML PO SOLN: 30.0000 g | Freq: Three times a day (TID) | ORAL | 0 refills | Status: AC

## 2016-12-11 MED ORDER — PANTOPRAZOLE SODIUM 40 MG PO TBEC: 40.0000 mg | DELAYED_RELEASE_TABLET | Freq: Two times a day (BID) | ORAL | 0 refills | Status: AC

## 2016-12-11 MED ORDER — OXYCODONE HCL 5 MG PO TABS: 5.0000 mg | ORAL_TABLET | Freq: Four times a day (QID) | ORAL | 0 refills | Status: AC | PRN

## 2016-12-11 NOTE — Progress Notes (Signed)
Advanced Directive completed with the assistance of Honor and two witnesses. A copy placed on patient's chart.  A copy placed at Medical Records. Original copy given to Peterson Ao, patient's spouse.    12/11/16 1139  Clinical Encounter Type  Visited With Patient and family together  Visit Type Follow-up (Complete AD)   Berneice Heinrich.

## 2016-12-11 NOTE — Telephone Encounter (Signed)
Per Md order to sch 12/13/16 apt.  Patient's son Legrand Como was called and given apt.

## 2016-12-11 NOTE — Clinical Social Work Placement (Signed)
Patient received and accepted bed offer at [facility]. Patient to be transported by Lesle Reek contacted and family aware. Patient's RN can call report to [phone], patient going to room 3218.   CLINICAL SOCIAL WORK PLACEMENT  NOTE  Date:  12/11/2016  Patient Details  Name: Alexandria [name] MRN: [id number] Date of Birth: 1951-07-30  Clinical Social Work is seeking post-discharge placement for this patient at the [place] level of care (*CSW will initial, date and re-position this form in  chart as items are completed):  Yes   Patient/family provided with [place] Work Department's list of facilities offering this level of care within the geographic area requested by the patient (or if unable, by the patient's family).  Yes   Patient/family informed of their freedom to choose among providers that offer the needed level of care, that participate in Medicare, Medicaid or managed care program needed by the patient, have an available bed and are willing to accept the patient.  Yes   Patient/family informed of [facility]'s ownership interest in [facility] and [facility], as well as of the fact that they are under no obligation to receive care at these facilities.  PASRR submitted to EDS on 12/04/16     PASRR number received on 12/04/16     Existing PASRR number confirmed on       FL2 transmitted to all facilities in geographic area requested by pt/family on 12/04/16     FL2 transmitted to all facilities within larger geographic area on       Patient informed that his/her managed care company has contracts with or will negotiate with certain facilities, including the following:        Yes   Patient/family informed of bed offers received.  Patient chooses bed at [facility]     Physician recommends and patient chooses bed at      Patient to be transferred to [facility] on 12/11/16.  Patient to be  transferred to facility by [facility]     Patient family notified on 12/11/16 of transfer.  Name of family member notified:  Barbie Banner      PHYSICIAN       Additional Comment:    _______________________________________________ Burnis Medin, LCSW 12/11/2016, 11:25 AM

## 2016-12-11 NOTE — Progress Notes (Signed)
Pt discharging to SNF. CSW following.  

## 2016-12-11 NOTE — Discharge Summary (Signed)
Discharge Summary  Alexandria [name] OLM:[id number] DOB: 1951/05/18  PCP: Nyoka Cowden, MD  Admit date: 11/23/2016 Discharge date: 12/11/2016  Time spent: >50mins  Recommendations for Outpatient Follow-up:  1. F/u with SNF MD for hospital discharge follow up, repeat cbc/cmp at follow up 2. f/u with oncology Dr Marin Olp in one week for metastatic breast cancer 3. f/u with [facility] for GI bleed  Discharge Diagnoses:  Active Hospital Problems   Diagnosis Date Noted  . Abdominal pain   . Acute blood loss anemia, Coagulopathy & mild thrombocytopenia  11/27/2016  . Electrolyte imbalance 11/27/2016  . Acute hypoxemic respiratory failure (HCC) in setting of hemorrhagic shock. LLL atelectasis. Pulmonary edema.  11/27/2016  . Acute encephalopathy, both metabolic and hepatic  01/00/7121  . Abnormal liver enzymes. Had CT imaging c/w cirrhosis but Biopsy negative.  11/27/2016  . Leukocytosis   . Acute upper GI bleed 11/23/2016  . HTN (hypertension), Chronic Diastolic HF, h/o HLD and CAD.  04/08/2013    Resolved Hospital Problems   Diagnosis Date Noted Date Resolved  No resolved problems to display.    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  The patient reported the bleeding began on 3/7, was bright red and was associated with abdominal pain.    She was recently admitted from 2/16-2/24 for nissen fundoplication in setting of hiatal hernia.  During the surgery, her liver was noted to have an abnormal appearance prompting biopsy which was positive for metastatic breast cancer. Hospital course was  complicated by AKI in the setting of hypotension and volume depletion.   Chart review shows she was having difficulty at home and husband had requested for her to be placed in a SNF.  The patient was scheduled to have a PET scan 2/28 which showed cirrhotic / hypermetabolic liver consistent with metastatic disease.  She was unable to make her ONC follow up.   The patient presented to the ER 3/8 with GIB. She was altered, grey and vomiting up blood.  Initial labs - Na 141, K 4.6, Cl 108, glucose 132, BUN 18, Sr Cr 0.80, lactic acid 2.73, WBC 12.8, Hgb 11 and platelets 300.  She developed hypotension and progressive lethargy requiring intubation.  Central line placed.  GI, CCS & CCM called for evaluation.   Hospital Course:  Active Problems:   HTN (hypertension), Chronic Diastolic HF, h/o HLD and CAD.    Acute upper GI bleed   Leukocytosis   Acute blood loss anemia, Coagulopathy & mild thrombocytopenia    Electrolyte imbalance   Acute hypoxemic respiratory failure ([place]) in setting of hemorrhagic shock. LLL atelectasis. Pulmonary edema.    Acute encephalopathy, both metabolic and hepatic    Abnormal liver enzymes. Had CT imaging c/w cirrhosis but Biopsy negative.    Abdominal pain  Upper GI bleed with acute blood loss anemia, massive gi bleed with hypovolemic shock/required intubation, icu admision s/p Nissen a few weeks ago She became hemodynamically unstable and intubated for airway protection Urgent EGD on 3/8: "Large ulceration at GEJ, with nodule as described above with active bleeding. Clips were placed.  Injected with epinephrine. Unclear if complete hemostasis was achieved, visualization was poor  given the amount of blood and clot in the lumen - Blood noted throughout the examined stomach and  small bowel with poor visualization" Repeat EGD on 3/10  "suspect patient had bleeding at large ulceration at surgical  anastomosis. Nissen is not  intact and suspect bleeding from ulceration, and perhaps sutures were ruptured from patient's  vomiting post-operatively. Given her underlying malignancy she is also at risk for poor wound  healing. No active bleeding noted during this exam. She received prbc transfusion x2untis, and multple ffp transfusion during her stay in the ICU, she was treated with ppi drip then bid pantoprazole.  She is extubated on 3/15 and transferred to hospitalist service on 3/17, transferred out of icu on 3/22 Diet as tolerated. Hb and hct have remained stable. She received iv ironx1 on 3/22 per hematology/oncology  I have discussed with GI  On 3/23 regarding follow up, GI will see patient in the clinic, may repeat egd at follow up.  Metabolic encephalopathy/ hepatic porto-systemic encephalopathy/ICU delirium? 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   continue with tid lactulosa,  ammonia level trending down, 39 at discharge.  Mentation has improved, still slow in answering question, but aaox3 at discharge  Acute on chronic diastolic heart failure with bliateral lower extremity pitting edema Echo lvef 40%-08%, grade 1 diastolic function /venous ultrasound no DVT.  Currently no chest pain, on room air, lower extremity edema is improving at discharge. Continue coreg, holding on  Lisinopril due to borderline bp she is discharged to SNF on lasix 40mg  bid, continue fluids restriction.     Hypokalemia: replace k, mag 1.9   HTN. Continue coreg, hold lisinopril due to borderline bp, continue diuese if bp allows  Yeast cystitis  Urine dark with sediment, ua with large leuk, wbc TNTC, RBC TNTC,  urine culture+ yeast,   started dilfucan and finish treatment course no fever, no leukocytosis. Remove foley removed on 3/23 central line removed on 3/23   skin  Rash on abdominal pannus, continue nystatin topical and topical steroids, oral diflucan Skin looks inflamed and irritated, augmentin added  to cover superimposed bacterial infection.  Metastatic breast cancer with liver mets. Oncology Dr Marin Olp started her on monthly hormonal therapy with Faslodex, first dose given on 3/14 .  Dr Marin Olp also planning to start her on cell cycle check point inhibitor [facility] outpatient  Appreciate oncology input.   Body mass index is 39.89 kg/m.   DVT prophylaxis:scd Code Status:Patient changed to full code on 3/23 Family Communication:patient and son at bedside on 3/25 Disposition Plan:SNF on 3/26   Consultants:  GI   Oncology  General surgery  icu /critical care admission ,TRH transfer on 3/17  Procedures: Upper endoscopy 03/8 and 03/10. Intubation on 3/8, extubation on 3/15 Right IJ placement on 3/8, right IJ removed on 3/22 prbc transfusion on 3/8 and 3/10 FFP transfusion on 3/8, 3/9 , 3/10 and 3/11    Antimicrobials:  rocephin from 3/9 to 3/11 augmentin from 3/21 to 3/23, restarted on 3/25 due to concerning of superinfection of abdominal skin rash.    Discharge Exam: BP 109/68 (BP Location: Left Arm)   Pulse 87   Temp 98.9 F (37.2 C) (Oral)   Resp 20   Ht 5\' 6"  (  1.676 m)   Wt 112.4 kg (247 lb 12.8 oz)   LMP 01/30/2005   SpO2 96%   BMI 40.00 kg/m   General: obese, frail, NAD, aaox3 Cardiovascular: RRR Respiratory: diminished at basis, no rales, no rhonchi, no wheezing Abdominal: soft, + bs, Lower abdominal wall edema/irritated and itchy around abdominal pannus Extremity: bilateral lower extremity pitting edema is improving  Discharge Instructions You were cared for by a hospitalist during your hospital stay. If you have any questions about your discharge  medications or the care you received while you were in the hospital after you are discharged, you can call the unit and asked to speak with the hospitalist on call if the hospitalist that took care of you is not available. Once you are discharged, your primary care physician will handle any further medical issues. Please note that NO REFILLS for any discharge medications will be authorized once you are discharged, as it is imperative that you return to your primary care physician (or establish a relationship with a primary care physician if you do not have one) for your aftercare needs so that they can reassess your need for medications and monitor your lab values.  Discharge Instructions    Increase activity slowly    Complete by:  As directed      Allergies as of 12/11/2016      Reactions   Contrast Media [iodinated Diagnostic Agents] Anaphylaxis   Solu-medrol [methylprednisolone Acetate] Other (See Comments)   Pt. States she is not allergic, just can't tolerate medication well.    Albuterol Other (See Comments)   Anxious    Prednisone Other (See Comments)   Pt gets very agitated when she takes high doses of steroids      Medication List    STOP taking these medications   anastrozole 1 MG tablet Commonly known as:  ARIMIDEX   atorvastatin 20 MG tablet Commonly known as:  LIPITOR   docusate sodium 100 MG capsule Commonly known as:  COLACE   DSS 100 MG Caps   EMBEDA 50-2 MG Cpcr Generic drug:  Morphine-Naltrexone   levETIRAcetam 500 MG tablet Commonly known as:  KEPPRA   lidocaine 5 % Commonly known as:  LIDODERM   lisinopril 5 MG tablet Commonly known as:  PRINIVIL,ZESTRIL   LORazepam 0.5 MG tablet Commonly known as:  ATIVAN   lubiprostone 24 MCG capsule Commonly known as:  AMITIZA   oxyCODONE-acetaminophen 10-325 MG tablet Commonly known as:  PERCOCET     TAKE these medications   albuterol 108 (90 Base) MCG/ACT inhaler Commonly known as:  PROVENTIL  HFA;VENTOLIN HFA Inhale 2 puffs into the lungs every 6 (six) hours as needed for wheezing or shortness of breath.   amoxicillin-clavulanate 875-125 MG tablet Commonly known as:  AUGMENTIN Take 1 tablet by mouth every 12 (twelve) hours.   carvedilol 3.125 MG tablet Commonly known as:  COREG Take 1 tablet (3.125 mg total) by mouth 2 (two) times daily with a meal.   cyclobenzaprine 10 MG tablet Commonly known as:  FLEXERIL Take 10 mg by mouth at bedtime as needed for muscle spasms.   escitalopram 10 MG tablet Commonly known as:  LEXAPRO TAKE 1 TABLET BY MOUTH TWICE DAILY   feeding supplement Liqd Take 1 Container by mouth 3 (three) times daily with meals.   fluconazole 100 MG tablet Commonly known as:  DIFLUCAN Take 1 tablet (100 mg total) by mouth daily.   furosemide 40 MG tablet Commonly known as:  LASIX Take 1  tablet (40 mg total) by mouth 2 (two) times daily. What changed:  when to take this   HAIR/SKIN/NAILS PO Take 1 tablet by mouth 3 (three) times daily.   lactulose 10 GM/15ML solution Commonly known as:  CHRONULAC Take 45 mLs (30 g total) by mouth 3 (three) times daily.   metolazone 2.5 MG tablet Commonly known as:  ZAROXOLYN Take 1 tablet (2.5 mg total) by mouth daily as needed (fluid gain).   montelukast 5 MG chewable tablet Commonly known as:  SINGULAIR Chew 1 tablet (5 mg total) by mouth at bedtime.   multivitamin with minerals Tabs tablet Take 1 tablet by mouth daily.   nystatin cream Commonly known as:  MYCOSTATIN Apply topically 2 (two) times daily.   oxyCODONE 5 MG immediate release tablet Commonly known as:  Oxy IR/ROXICODONE Take 1 tablet (5 mg total) by mouth every 6 (six) hours as needed for severe pain. What changed:  when to take this  reasons to take this   pantoprazole 40 MG tablet Commonly known as:  PROTONIX Take 1 tablet (40 mg total) by mouth 2 (two) times daily.   potassium chloride SA 20 MEQ tablet Commonly known as:   K-DUR,KLOR-CON Take 2 tablets (40 mEq total) by mouth daily. What changed:  how much to take   ranitidine 150 MG tablet Commonly known as:  ZANTAC Take 1 tablet (150 mg total) by mouth at bedtime. What changed:  when to take this   triamcinolone cream 0.1 % Commonly known as:  KENALOG Apply 1 application topically 2 (two) times daily.      Allergies  Allergen Reactions  . Contrast Media [Iodinated Diagnostic Agents] Anaphylaxis  . Solu-Medrol [Methylprednisolone Acetate] Other (See Comments)    Pt. States she is not allergic, just can't tolerate medication well.   . Albuterol Other (See Comments)    Anxious   . Prednisone Other (See Comments)    Pt gets very agitated when she takes high doses of steroids    Contact information for follow-up providers    Manus Gunning, MD Follow up on 01/24/2017.   Specialty:  Gastroenterology Why:  9:30 am Contact information: [place] [address] [phone]        Volanda Napoleon, MD Follow up in 1 week(s).   Specialty:  Oncology Why:  metastatic breast cancer Contact information: [place] [address] [phone]            Contact information for after-discharge care    Destination    [facility] Follow up.   Specialty:  [place] information: [address] [place] [phone]                   The results of significant diagnostics from this hospitalization (including imaging, microbiology, ancillary and laboratory) are listed below for reference.    Significant Diagnostic Studies: Ct Abdomen Pelvis Wo Contrast  Result Date: 11/23/2016 CLINICAL DATA:  Abdominal pain. Breast cancer, metastatic to the liver EXAM: CT ABDOMEN AND PELVIS WITHOUT CONTRAST TECHNIQUE: Multidetector CT imaging of the abdomen and pelvis was performed following the standard protocol without IV  contrast. COMPARISON:  PET CT 11/15/2016 FINDINGS: Lower chest: Small left pleural effusion, slightly increased in size since prior PET CT. Bibasilar opacities, likely atelectasis. Heart is normal size. Small hiatal hernia. Hepatobiliary: Changes of cirrhosis. Previously seen extensive metastatic disease throughout the liver is difficult to appreciate on this  unenhanced study. Gallbladder unremarkable. Pancreas: No focal abnormality or ductal dilatation. Spleen: No focal abnormality.  Normal size. Adrenals/Urinary Tract: No adrenal abnormality. No focal renal abnormality. No stones or hydronephrosis. Urinary bladder is unremarkable. Stomach/Bowel: No evidence of aneurysm or adenopathy. Scattered aortic calcifications Vascular/Lymphatic: There is wall thickening within the cecum and ascending colon to the level of the proximal transverse colon. This is concerning for colitis. Stomach and small bowel decompressed. Reproductive: Prior hysterectomy.  No adnexal masses. Other: Small amount of free fluid adjacent to the liver. No free air. Musculoskeletal: No acute bony abnormality. IMPRESSION: Changes of cirrhosis. Previously demonstrated metastatic disease to the liver is difficult to visualize on this unenhanced study. Small amount of perihepatic ascites.  Small left pleural effusion. Wall thickening and surrounding inflammatory stranding involving the cecum, ascending colon and proximal transverse colon concerning for colitis. Small hiatal hernia. Bibasilar atelectasis. Electronically Signed   By: Rolm Baptise M.D.   On: 11/23/2016 10:21   Dg Chest 1 View  Result Date: 12/04/2016 CLINICAL DATA:  Dyspnea. EXAM: CHEST 1 VIEW COMPARISON:  11/28/2016 and 11/27/2016 FINDINGS: Endotracheal tube and central line at been removed. Heart size and pulmonary vascularity are now normal. Slight residual atelectasis of both lung bases. Chronic elevation of the right hemidiaphragm. IMPRESSION: Slight residual atelectasis at the  lung bases. Electronically Signed   By: Lorriane Shire M.D.   On: 12/04/2016 09:53   Ct Head Wo Contrast  Result Date: 11/27/2016 CLINICAL DATA:  Unresponsive this morning. Acute encephalopathy. Metastatic breast cancer. EXAM: CT HEAD WITHOUT CONTRAST TECHNIQUE: Contiguous axial images were obtained from the base of the skull through the vertex without intravenous contrast. COMPARISON:  None. FINDINGS: Brain: Diffusely enlarged ventricles and subarachnoid spaces. No intracranial hemorrhage, mass lesion or CT evidence of acute infarction. Vascular: No hyperdense vessel or unexpected calcification. Skull: Mild bilateral mastoid opacification. No evidence of bony metastatic disease. Sinuses/Orbits: No acute finding. Other: None. IMPRESSION: No acute abnormality.  Mild diffuse cerebral atrophy. Electronically Signed   By: Claudie Revering M.D.   On: 11/27/2016 16:09   Nm Pet Image Initial (pi) Skull Base To Thigh  Result Date: 11/15/2016 CLINICAL DATA:  Initial treatment strategy for right-sided breast cancer metastatic to liver. Recent fundoplication, a liver biopsy indicated metastatic breast cancer. EXAM: NUCLEAR MEDICINE PET SKULL BASE TO THIGH TECHNIQUE: 10.1 mCi F-18 FDG was injected intravenously. Full-ring PET imaging was performed from the skull base to thigh after the radiotracer. CT data was obtained and used for attenuation correction and anatomic localization. FASTING BLOOD GLUCOSE:  Value: 108 mg/dl COMPARISON:  Multiple exams, including 12/06/2014 CT scan FINDINGS: Body habitus reduces diagnostic sensitivity and specificity. NECK No hypermetabolic lymph nodes in the neck. No hypermetabolic activity associated with the small posterior nasopharyngeal nodules seen on recent MRI. CHEST No hypermetabolic mediastinal or hilar nodes. No suspicious pulmonary nodules on the CT data. Postoperative findings laterally in the right breast. Hiatal hernia with physiologic activity. Coronary, aortic arch, and branch  vessel atherosclerotic vascular disease. Mild cardiomegaly. Mild atelectasis in both lung bases. Trace left pleural effusion. Nissen fundoplication, postoperative findings in this vicinity. ABDOMEN/PELVIS Abnormal nodular contour the liver with heterogeneous density and abnormal scattered geographic hepatic activity concerning for diffuse hepatic metastatic disease given the recent biopsy results. The high signal intensity is scattered and multifocal, and a component of this could be due to other entities such as infiltrative hepatocellular carcinoma. The dome of the right hepatic lobe appears to have some of the more confluent hypermetabolic  activity, and a maximum SUV measurement in this region yields a maximum SUV of 9.0. Accentuated activity along the laparotomy site/linea alba, thought to be inflammatory. Mild perihepatic ascites. Small amount of transverse mesocolon edema. Outpouching of the cecum may reflect appendiceal remnant or appendix. SKELETON No compelling findings of osseous metastatic disease at this time. Mild dextroconvex lumbar scoliosis. Degenerative anterolisthesis at L5-S1. IMPRESSION: 1. Cirrhotic and generally hypermetabolic liver with heterogeneous abnormal accentuated multifocal activity throughout the liver. A large region of the right hepatic lobe was measured and demonstrated a maximum SUV of 9.0. The patient reportedly has biopsy proven metastatic disease to the liver and accordingly much of the appearance is attributed to metastatic disease, although an underlying component could be due to cirrhosis and/or infiltrative hepatocellular carcinoma. Mild perihepatic ascites with a small amount of edema in the transverse mesocolon. 2. The small posterior nasopharyngeal nodule seen on recent MRI is not hypermetabolic. 3. Recent postoperative findings associated with patient's fundoplication. There is mild atelectasis in both lung bases and a trace left pleural effusion. 4. Mild cardiomegaly.  Coronary, aortic arch, and branch vessel atherosclerotic vascular disease. 5. Degenerative anterolisthesis at L5-S1. Electronically Signed   By: Van Clines M.D.   On: 11/15/2016 11:07   Dg Chest Port 1 View  Result Date: 11/28/2016 CLINICAL DATA:  Atelectasis, intubated EXAM: PORTABLE CHEST 1 VIEW COMPARISON:  11/27/2016 FINDINGS: Endotracheal tube and right central line remain in place, unchanged. Patchy perihilar and lower lobe opacities could reflect edema or infection. Mild cardiomegaly. No visible significant effusions. IMPRESSION: Bilateral perihilar and lower lobe opacities could reflect edema or pneumonia. Electronically Signed   By: Rolm Baptise M.D.   On: 11/28/2016 07:10   Dg Chest Port 1 View  Result Date: 11/27/2016 CLINICAL DATA:  Respiratory failure. EXAM: PORTABLE CHEST 1 VIEW COMPARISON:  11/26/2016 . FINDINGS: Endotracheal tube, right IJ line in stable position. Heart size stable. Bibasilar atelectasis again noted. Interim slight improvement. Small left pleural effusion. No pneumothorax. Basilar subsegmental atelectasis. Small left pleural effusion. No pneumothorax. IMPRESSION: 1. Lines and tubes in stable position. 2.  Bibasilar atelectasis again noted.  Interim slight improvement. Electronically Signed   By: Marcello Moores  Register   On: 11/27/2016 07:28   Dg Chest Port 1 View  Result Date: 11/26/2016 CLINICAL DATA:  Acute respiratory failure EXAM: PORTABLE CHEST 1 VIEW COMPARISON:  Chest radiograph from one day prior. FINDINGS: Endotracheal tube tip is 0.7 cm above the carina. Right internal jugular central venous catheter terminates in the lower third of the superior vena cava. Stable cardiomediastinal silhouette with top-normal heart size. No pneumothorax. No pleural effusion. Low lung volumes. Patchy bibasilar lung opacities appear stable. No pulmonary edema. Surgical clips overlie the right axilla and esophagogastric junction region. IMPRESSION: 1. Low lying endotracheal tube  with tip 0.7 cm above the carina, recommend retracting 1-2 cm. 2. No pneumothorax. 3. Low lung volumes with stable patchy bibasilar lung opacities, favor atelectasis. These results were called by telephone at the time of interpretation on 11/26/2016 at 7:16 am to [facility], who verbally acknowledged these results. Electronically Signed   By: Ilona Sorrel M.D.   On: 11/26/2016 07:18   Dg Chest Port 1 View  Result Date: 11/25/2016 CLINICAL DATA:  Acute respiratory failure with hypoxia EXAM: PORTABLE CHEST 1 VIEW COMPARISON:  Chest x-rays dated 11/24/2016 and 11/23/2016. FINDINGS: Study is hypoinspiratory with crowding of the perihilar and bibasilar bronchovascular markings. Suspect central pulmonary vascular congestion and mild interstitial edema, without overt alveolar pulmonary edema.  Probable small bilateral pleural effusions. Heart size and mediastinal contours appear stable. Endotracheal tube appears well positioned with tip just above the level of the carina. Right IJ central line appears appropriately positioned with tip at the level of the lower SVC. Enteric tube passes below the diaphragm. No pneumothorax seen. IMPRESSION: 1. Low lung volumes. Probable small bilateral pleural effusions. Mild pulmonary vascular congestion and interstitial edema, suggesting mild volume overload, without overt alveolar pulmonary edema. 2. Support apparatus appears appropriately positioned. Electronically Signed   By: Franki Cabot M.D.   On: 11/25/2016 08:03   Dg Chest Port 1 View  Result Date: 11/24/2016 CLINICAL DATA:  Acute respiratory failure EXAM: PORTABLE CHEST 1 VIEW COMPARISON:  11/23/2016 FINDINGS: The endotracheal tube has been repositioned and is 1 cm above the carina. The nasogastric tube extends into the stomach. The right jugular central line terminates in the low SVC. Left base consolidation persists without significant interval change. No pneumothorax. IMPRESSION: 1.  Support equipment appears  satisfactorily positioned. 2. Unchanged left base consolidation. Electronically Signed   By: Andreas Newport M.D.   On: 11/24/2016 05:24   Dg Chest Portable 1 View  Result Date: 11/23/2016 CLINICAL DATA:  Post intubation. History of metastatic breast cancer, cirrhosis. EXAM: PORTABLE CHEST 1 VIEW COMPARISON:  Chest radiograph November 07, 2016 FINDINGS: Cardiac silhouette is mildly enlarged, improved from prior radiograph. Tortuous, mildly calcified aorta. Persistently elevated RIGHT hemidiaphragm with low lung volumes. Chronic interstitial changes with strandy densities in lung bases. No pleural effusion. No pneumothorax. RIGHT mainstem bronchus intubation. RIGHT internal jugular central venous catheter projects at cavoatrial junction. Surgical clips RIGHT breast. Osseous structures are nonsuspicious. IMPRESSION: RIGHT mainstem bronchus intubation, recommend 2-3 cm retraction. RIGHT IJ central venous catheter distal tip at cavoatrial junction. No pneumothorax. Mild cardiomegaly, mild chronic interstitial changes with bibasilar atelectasis/ scarring. Acute findings discussed with and reconfirmed by Dr.PEDRO CARDAMA on 11/23/2016 at 1:55 pm. Electronically Signed   By: Elon Alas M.D.   On: 11/23/2016 13:56   Dg Abd Portable 1v  Result Date: 11/30/2016 CLINICAL DATA:  Initial evaluation for acute abdominal pain, distention. EXAM: PORTABLE ABDOMEN - 1 VIEW COMPARISON:  None. FINDINGS: It is patent within normal limits without evidence for obstruction or ileus. No free air on these limited supine views the abdomen. No abnormal bowel wall thickening. No soft tissue mass or abnormal calcification. Scoliosis with multilevel degenerative spondylolysis noted within the visualized spine. No acute osseus abnormality. Partially visualized lung bases are grossly clear parent IMPRESSION: Nonobstructive bowel gas pattern with no radiographic evidence for acute intra-abdominal process. Electronically Signed   By:  Jeannine Boga M.D.   On: 11/30/2016 23:46   Dg Abd Portable 1v  Result Date: 11/23/2016 CLINICAL DATA:  OG tube placement at bedside. EXAM: PORTABLE ABDOMEN - 1 VIEW COMPARISON:  CT abdomen pelvis earlier today and 11/15/2016. FINDINGS: OG tube tip in the distal body of the stomach. Mild gaseous distention of the visualized transverse colon with moderate stool burden. No other distended bowel. IMPRESSION: 1. OG tube tip in the distal body of the stomach. 2. Probable mild colonic ileus. Electronically Signed   By: Evangeline Dakin M.D.   On: 11/23/2016 17:34    Microbiology: Recent Results (from the past 240 hour(s))  Culture, Urine     Status: Abnormal   Collection Time: 12/06/16  6:50 PM  Result Value Ref Range Status   Specimen Description URINE, RANDOM  Final   Special Requests NONE  Final   Culture >=100,000  COLONIES/mL YEAST (A)  Final   Report Status 12/08/2016 FINAL  Final     Labs: Basic Metabolic Panel:  Recent Labs Lab 12/05/16 0432 12/06/16 0459 12/07/16 0500 12/08/16 0502 12/09/16 0539 12/10/16 0453 12/11/16 0543  NA 142 141 140 140 138 138 139  K 3.0* 3.6 3.6 3.5 3.1* 3.2* 3.4*  CL 113* 114* 108 107 101 104 106  CO2 22 20* 22 23 26 25 25   GLUCOSE 113* 117* 109* 105* 114* 109* 109*  BUN 10 8 10 11 10 11 12   CREATININE 0.67 0.72 0.79 0.73 0.84 0.86 0.69  CALCIUM 8.1* 8.3* 8.4* 8.3* 8.7* 8.6* 8.6*  MG 1.9 1.7 1.7 1.6* 2.0 1.8 1.9  PHOS 2.4* 2.4* 3.1 2.9  --   --   --    Liver Function Tests:  Recent Labs Lab 12/07/16 0500 12/08/16 0502 12/09/16 0539 12/10/16 0453 12/11/16 0543  AST 213*  --  266* 246* 251*  ALT 72*  --  89* 86* 84*  ALKPHOS 136*  --  162* 151* 157*  BILITOT 6.4*  --  6.6* 5.8* 5.9*  PROT 6.1*  --  6.5 6.1* 6.2*  ALBUMIN 2.0* 1.9* 2.1* 1.9* 2.0*   No results for input(s): LIPASE, AMYLASE in the last 168 hours.  Recent Labs Lab 12/06/16 0459 12/08/16 0502 12/09/16 0539 12/10/16 0453 12/11/16 0543  AMMONIA 46* 96* 51* 55*  39*   CBC:  Recent Labs Lab 12/06/16 0459 12/07/16 0500 12/08/16 0502 12/10/16 0453  WBC 8.8 9.4 7.1 8.6  NEUTROABS 4.8 5.5 3.7 4.5  HGB 9.1* 9.2* 9.3* 9.4*  HCT 27.0* 27.5* 27.7* 28.5*  MCV 79.4 79.5 79.6 79.6  PLT 174 145* 138* 144*   Cardiac Enzymes: No results for input(s): CKTOTAL, CKMB, CKMBINDEX, TROPONINI in the last 168 hours. BNP: BNP (last 3 results)  Recent Labs  04/25/16 0921  BNP 88.8    ProBNP (last 3 results) No results for input(s): PROBNP in the last 8760 hours.  CBG: No results for input(s): GLUCAP in the last 168 hours.     SignedFlorencia Reasons MD, PhD  [facility] 12/11/2016, 10:58 AM

## 2016-12-11 NOTE — Progress Notes (Signed)
Alexandria [name] is going to be transferred to [facility] today. She is ready.  It sounds like she had a pretty uneventful day yesterday.  She still has some abdominal discomfort. This appears to be periumbilical. She is on the lactulose for the elevated ammonia. This is causing some diarrhea.  She has had no fever. She has had no obvious bleeding.  Her liver tests are still elevated. Her bilirubin is 5.9. Her ammonia is down to 39. Her AST is 251 with an ALT of 84.  Her albumin is 2.0. It is still quite low.  Her physical exam is unchanged. Her vital signs all look pretty stable. Her blood pressure is 109/68. Her head and neck exam shows no scleral icterus. There is no intraoral lesions. She has no adenopathy in the neck. Lungs are clear bilaterally. She has some slight decrease at the bases. Cardiac exam regular in rhythm with no murmurs, rubs or bruits. Abdomen is obese. Bowel sounds are slightly decreased. There is some tenderness diffusely. There is no guarding or rebound tenderness. I cannot palpate her liver. Extremities shows some chronic trace edema in her legs. Skin exam shows no rashes, ecchymoses or petechia. Neurological exam shows no focal neurological deficits.  [name] is a 66 year old white female with metastatic breast cancer. Her disease is confined to her liver. She's had one dose of Faslodex. She is due for her next dose this week. We are working on trying to get her the oral agent, Verzenio. Hopefully we will know about this this week.  She will be transferred this week. I will need to see her in the office later on this week so we can give her the second Faslodex injection.  We had a good prayer session this morning. She wanted to pray with me before she was transferred. I reassured her that I would see her in the office. I told her that we would still be very much involved with her care.  She is very happy that she got such great care from everybody up on  4 W.  [name], MD  [place] 4:4

## 2016-12-12 DIAGNOSIS — K922 Gastrointestinal hemorrhage, unspecified: Secondary | ICD-10-CM | POA: Diagnosis not present

## 2016-12-12 DIAGNOSIS — D62 Acute posthemorrhagic anemia: Secondary | ICD-10-CM | POA: Diagnosis not present

## 2016-12-12 DIAGNOSIS — C50919 Malignant neoplasm of unspecified site of unspecified female breast: Secondary | ICD-10-CM | POA: Diagnosis not present

## 2016-12-12 DIAGNOSIS — C787 Secondary malignant neoplasm of liver and intrahepatic bile duct: Secondary | ICD-10-CM | POA: Diagnosis not present

## 2016-12-13 ENCOUNTER — Ambulatory Visit (HOSPITAL_BASED_OUTPATIENT_CLINIC_OR_DEPARTMENT_OTHER): Payer: Medicare Other | Admitting: Family

## 2016-12-13 ENCOUNTER — Other Ambulatory Visit (HOSPITAL_BASED_OUTPATIENT_CLINIC_OR_DEPARTMENT_OTHER): Payer: Medicare Other

## 2016-12-13 ENCOUNTER — Ambulatory Visit (HOSPITAL_BASED_OUTPATIENT_CLINIC_OR_DEPARTMENT_OTHER): Payer: Medicare Other

## 2016-12-13 ENCOUNTER — Other Ambulatory Visit: Payer: Self-pay | Admitting: *Deleted

## 2016-12-13 VITALS — BP 111/65 | HR 76 | Temp 97.3°F | Resp 17

## 2016-12-13 DIAGNOSIS — I509 Heart failure, unspecified: Secondary | ICD-10-CM | POA: Diagnosis not present

## 2016-12-13 DIAGNOSIS — K922 Gastrointestinal hemorrhage, unspecified: Secondary | ICD-10-CM | POA: Diagnosis not present

## 2016-12-13 DIAGNOSIS — B3749 Other urogenital candidiasis: Secondary | ICD-10-CM

## 2016-12-13 DIAGNOSIS — C787 Secondary malignant neoplasm of liver and intrahepatic bile duct: Secondary | ICD-10-CM | POA: Diagnosis not present

## 2016-12-13 DIAGNOSIS — D500 Iron deficiency anemia secondary to blood loss (chronic): Secondary | ICD-10-CM

## 2016-12-13 DIAGNOSIS — D509 Iron deficiency anemia, unspecified: Secondary | ICD-10-CM

## 2016-12-13 DIAGNOSIS — R21 Rash and other nonspecific skin eruption: Secondary | ICD-10-CM | POA: Diagnosis not present

## 2016-12-13 DIAGNOSIS — K7460 Unspecified cirrhosis of liver: Secondary | ICD-10-CM

## 2016-12-13 DIAGNOSIS — R7989 Other specified abnormal findings of blood chemistry: Secondary | ICD-10-CM | POA: Diagnosis not present

## 2016-12-13 DIAGNOSIS — C50919 Malignant neoplasm of unspecified site of unspecified female breast: Secondary | ICD-10-CM

## 2016-12-13 DIAGNOSIS — J9600 Acute respiratory failure, unspecified whether with hypoxia or hypercapnia: Secondary | ICD-10-CM | POA: Diagnosis not present

## 2016-12-13 DIAGNOSIS — E860 Dehydration: Secondary | ICD-10-CM

## 2016-12-13 DIAGNOSIS — Z5111 Encounter for antineoplastic chemotherapy: Secondary | ICD-10-CM

## 2016-12-13 DIAGNOSIS — Z7189 Other specified counseling: Secondary | ICD-10-CM

## 2016-12-13 LAB — CMP (CANCER CENTER ONLY)
ALK PHOS: 188 U/L — AB (ref 26–84)
ALT: 90 U/L — AB (ref 10–47)
AST: 238 U/L (ref 11–38)
Albumin: 2.4 g/dL — ABNORMAL LOW (ref 3.3–5.5)
BUN: 13 mg/dL (ref 7–22)
CHLORIDE: 101 meq/L (ref 98–108)
CO2: 27 mEq/L (ref 18–33)
Calcium: 9 mg/dL (ref 8.0–10.3)
Creat: 1.2 mg/dl (ref 0.6–1.2)
GLUCOSE: 119 mg/dL — AB (ref 73–118)
POTASSIUM: 3.3 meq/L (ref 3.3–4.7)
Sodium: 136 mEq/L (ref 128–145)
Total Bilirubin: 7 mg/dl (ref 0.20–1.60)
Total Protein: 6.5 g/dL (ref 6.4–8.1)

## 2016-12-13 LAB — CBC WITH DIFFERENTIAL (CANCER CENTER ONLY)
BASO#: 0.1 10*3/uL (ref 0.0–0.2)
BASO%: 0.6 % (ref 0.0–2.0)
EOS%: 5.5 % (ref 0.0–7.0)
Eosinophils Absolute: 0.5 10*3/uL (ref 0.0–0.5)
HCT: 33.9 % — ABNORMAL LOW (ref 34.8–46.6)
HGB: 11.3 g/dL — ABNORMAL LOW (ref 11.6–15.9)
LYMPH#: 1.4 10*3/uL (ref 0.9–3.3)
LYMPH%: 14.1 % (ref 14.0–48.0)
MCH: 27 pg (ref 26.0–34.0)
MCHC: 33.3 g/dL (ref 32.0–36.0)
MCV: 81 fL (ref 81–101)
MONO#: 1.2 10*3/uL — AB (ref 0.1–0.9)
MONO%: 11.9 % (ref 0.0–13.0)
NEUT#: 6.6 10*3/uL — ABNORMAL HIGH (ref 1.5–6.5)
NEUT%: 67.9 % (ref 39.6–80.0)
PLATELETS: 219 10*3/uL (ref 145–400)
RBC: 4.18 10*6/uL (ref 3.70–5.32)
RDW: 23 % — AB (ref 11.1–15.7)
WBC: 9.8 10*3/uL (ref 3.9–10.0)

## 2016-12-13 LAB — LACTATE DEHYDROGENASE: LDH: 424 U/L — ABNORMAL HIGH (ref 125–245)

## 2016-12-13 LAB — TECHNOLOGIST REVIEW CHCC SATELLITE

## 2016-12-13 MED ORDER — FULVESTRANT 250 MG/5ML IM SOLN
INTRAMUSCULAR | Status: AC
  Filled 2016-12-13: qty 10

## 2016-12-13 MED ORDER — FLUCONAZOLE 150 MG PO TABS: 150.0000 mg | ORAL_TABLET | Freq: Every day | ORAL | 0 refills | Status: DC

## 2016-12-13 MED ORDER — ABEMACICLIB 100 MG PO TABS: 100.0000 mg | ORAL_TABLET | Freq: Two times a day (BID) | ORAL | 4 refills | Status: AC

## 2016-12-13 MED ORDER — FULVESTRANT 250 MG/5ML IM SOLN
500.0000 mg | Freq: Once | INTRAMUSCULAR | Status: AC
  Administered 2016-12-13: 500 mg via INTRAMUSCULAR

## 2016-12-13 MED ORDER — SODIUM CHLORIDE 0.9 % IV SOLN
Freq: Once | INTRAVENOUS | Status: AC
  Administered 2016-12-13: 13:00:00 via INTRAVENOUS

## 2016-12-13 NOTE — Patient Instructions (Signed)
Dehydration, Adult Dehydration is when there is not enough fluid or water in your body. This happens when you lose more fluids than you take in. Dehydration can range from mild to very bad. It should be treated right away to keep it from getting very bad. Symptoms of mild dehydration may include:   Thirst.  Dry lips.  Slightly dry mouth.  Dry, warm skin.  Dizziness. Symptoms of moderate dehydration may include:   Very dry mouth.  Muscle cramps.  Dark pee (urine). Pee may be the color of tea.  Your body making less pee.  Your eyes making fewer tears.  Heartbeat that is uneven or faster than normal (palpitations).  Headache.  Light-headedness, especially when you stand up from sitting.  Fainting (syncope). Symptoms of very bad dehydration may include:   Changes in skin, such as:  Cold and clammy skin.  Blotchy (mottled) or pale skin.  Skin that does not quickly return to normal after being lightly pinched and let go (poor skin turgor).  Changes in body fluids, such as:  Feeling very thirsty.  Your eyes making fewer tears.  Not sweating when body temperature is high, such as in hot weather.  Your body making very little pee.  Changes in vital signs, such as:  Weak pulse.  Pulse that is more than 100 beats a minute when you are sitting still.  Fast breathing.  Low blood pressure.  Other changes, such as:  Sunken eyes.  Cold hands and feet.  Confusion.  Lack of energy (lethargy).  Trouble waking up from sleep.  Short-term weight loss.  Unconsciousness. Follow these instructions at home:  If told by your doctor, drink an ORS:  Make an ORS by using instructions on the package.  Start by drinking small amounts, about  cup (120 mL) every 5-10 minutes.  Slowly drink more until you have had the amount that your doctor said to have.  Drink enough clear fluid to keep your pee clear or pale yellow. If you were told to drink an ORS, finish the  ORS first, then start slowly drinking clear fluids. Drink fluids such as:  Water. Do not drink only water by itself. Doing that can make the salt (sodium) level in your body get too low (hyponatremia).  Ice chips.  Fruit juice that you have added water to (diluted).  Low-calorie sports drinks.  Avoid:  Alcohol.  Drinks that have a lot of sugar. These include high-calorie sports drinks, fruit juice that does not have water added, and soda.  Caffeine.  Foods that are greasy or have a lot of fat or sugar.  Take over-the-counter and prescription medicines only as told by your doctor.  Do not take salt tablets. Doing that can make the salt level in your body get too high (hypernatremia).  Eat foods that have minerals (electrolytes). Examples include bananas, oranges, potatoes, tomatoes, and spinach.  Keep all follow-up visits as told by your doctor. This is important. Contact a doctor if:  You have belly (abdominal) pain that:  Gets worse.  Stays in one area (localizes).  You have a rash.  You have a stiff neck.  You get angry or annoyed more easily than normal (irritability).  You are more sleepy than normal.  You have a harder time waking up than normal.  You feel:  Weak.  Dizzy.  Very thirsty.  You have peed (urinated) only a small amount of very dark pee during 6-8 hours. Get help right away if:  You   have symptoms of very bad dehydration.  You cannot drink fluids without throwing up (vomiting).  Your symptoms get worse with treatment.  You have a fever.  You have a very bad headache.  You are throwing up or having watery poop (diarrhea) and it:  Gets worse.  Does not go away.  You have blood or something green (bile) in your throw-up.  You have blood in your poop (stool). This may cause poop to look black and tarry.  You have not peed in 6-8 hours.  You pass out (faint).  Your heart rate when you are sitting still is more than 100 beats a  minute.  You have trouble breathing. This information is not intended to replace advice given to you by your health care provider. Make sure you discuss any questions you have with your health care provider. Document Released: 07/01/2009 Document Revised: 03/24/2016 Document Reviewed: 10/29/2015 Elsevier Interactive Patient Education  2017 Elsevier Inc.  

## 2016-12-13 NOTE — Progress Notes (Addendum)
Hematology and Oncology Follow Up Visit  MARGEL JOENS [id number] 1951-03-17 66 y.o. 12/13/2016   Principle Diagnosis:  Metastatic breast cancer breast - ER+/HER2 -, liver mets Iron deficiency anemia  Current Therapy:   IV feraheme  As indicated Faslodex 500 mg IM every 2 weeks starting dose and then transition to monthly Verzenio - to start once PA complete    Interim History:  Ms. Muenchow is here today with her son for follow-up and Faslodex. She is now in a nursing home and not adjusting well. She states that she is not being cared for and that the nurses have been "rough with her." She states that they do not take her to the bathroom and that they have been rough when turning her and moving her from chair to bed. Her son is upset by this and has scheduled a meeting with the nursing home supervisor tomorrow morning. She was tearful today when talking about it. The declined any need for a Education officer, museum at this time. Her son states that if the situation does not improve he will move her to another facility. She has not been there for a full 48 hours yet.  She will get her second dose of Faslodex today. She is still waiting for her insurance and financial assistance approval for the [employer]. Her son brought in the last of the required paperwork today so it should not be much longer.  She is feeling fatigued, weak, SOB with any exertion.  With the metastatic disease to the liver and history of NASH, unfortunately, her LFT's continue to increase. Ammonia level is pending.  She has been incontinent of urine and stool on the lasix and lactlose. She has a candidal rash of the lower abdomen and perineal area. She has been using a cream several times a day but this does not seem to have helped. We will get her on some Diflucan.  No fever, chills, n/v, cough or palpitations. She is tender across the abdomen and is very guarded on assessment. I was not able to palpate her lever or spleen tip on exam but  again she was quite guarded.  She states that last week she an episode of chest pain which resolved with taking pain medication. She has had no other episodes and states that is it recurs and does not improve she will go to the hospital.  She has chronic swelling in her lower extremities which seems to be slowly improving with the lasix. She has +1 pedal pulses.  Her appetite is down and she admits that she is not drinking as much fluids as she should and feels dehydrated. We will give her fluids today.   Medications:  Allergies as of 12/13/2016      Reactions   Contrast Media [iodinated Diagnostic Agents] Anaphylaxis   Solu-medrol [methylprednisolone Acetate] Other (See Comments)   Pt. States she is not allergic, just can't tolerate medication well.    Albuterol Other (See Comments)   Anxious    Prednisone Other (See Comments)   Pt gets very agitated when she takes high doses of steroids      Medication List       Accurate as of 12/13/16 11:24 AM. Always use your most recent med list.          albuterol 108 (90 Base) MCG/ACT inhaler Commonly known as:  PROVENTIL HFA;VENTOLIN HFA Inhale 2 puffs into the lungs every 6 (six) hours as needed for wheezing or shortness of breath.   amoxicillin-clavulanate  875-125 MG tablet Commonly known as:  AUGMENTIN Take 1 tablet by mouth every 12 (twelve) hours.   anastrozole 1 MG tablet Commonly known as:  ARIMIDEX   carvedilol 3.125 MG tablet Commonly known as:  COREG Take 1 tablet (3.125 mg total) by mouth 2 (two) times daily with a meal.   cyclobenzaprine 10 MG tablet Commonly known as:  FLEXERIL Take 10 mg by mouth at bedtime as needed for muscle spasms.   docusate sodium 100 MG capsule Commonly known as:  COLACE Take 100 mg by mouth daily.   escitalopram 10 MG tablet Commonly known as:  LEXAPRO TAKE 1 TABLET BY MOUTH TWICE DAILY   feeding supplement Liqd Take 1 Container by mouth 3 (three) times daily with meals.     fluconazole 100 MG tablet Commonly known as:  DIFLUCAN Take 1 tablet (100 mg total) by mouth daily.   furosemide 40 MG tablet Commonly known as:  LASIX Take 1 tablet (40 mg total) by mouth 2 (two) times daily.   HAIR/SKIN/NAILS PO Take 1 tablet by mouth 3 (three) times daily.   lactulose 10 GM/15ML solution Commonly known as:  CHRONULAC Take 45 mLs (30 g total) by mouth 3 (three) times daily.   lisinopril 5 MG tablet Commonly known as:  PRINIVIL,ZESTRIL   metolazone 2.5 MG tablet Commonly known as:  ZAROXOLYN Take 1 tablet (2.5 mg total) by mouth daily as needed (fluid gain).   montelukast 5 MG chewable tablet Commonly known as:  SINGULAIR Chew 1 tablet (5 mg total) by mouth at bedtime.   multivitamin with minerals Tabs tablet Take 1 tablet by mouth daily.   nystatin cream Commonly known as:  MYCOSTATIN Apply topically 2 (two) times daily.   oxyCODONE 5 MG immediate release tablet Commonly known as:  Oxy IR/ROXICODONE Take 1 tablet (5 mg total) by mouth every 6 (six) hours as needed for severe pain.   pantoprazole 40 MG tablet Commonly known as:  PROTONIX Take 1 tablet (40 mg total) by mouth 2 (two) times daily.   potassium chloride SA 20 MEQ tablet Commonly known as:  K-DUR,KLOR-CON Take 2 tablets (40 mEq total) by mouth daily.   ranitidine 150 MG tablet Commonly known as:  ZANTAC Take 1 tablet (150 mg total) by mouth at bedtime.   triamcinolone cream 0.1 % Commonly known as:  KENALOG Apply 1 application topically 2 (two) times daily.   VERZENIO 100 MG Tabs Generic drug:  Abemaciclib       Allergies:  Allergies  Allergen Reactions  . Contrast Media [Iodinated Diagnostic Agents] Anaphylaxis  . Solu-Medrol [Methylprednisolone Acetate] Other (See Comments)    Pt. States she is not allergic, just can't tolerate medication well.   . Albuterol Other (See Comments)    Anxious   . Prednisone Other (See Comments)    Pt gets very agitated when she takes  high doses of steroids    Past Medical History, Surgical history, Social history, and Family History were reviewed and updated.  Review of Systems: All other 10 point review of systems is negative.   Physical Exam:  oral temperature is 97.3 F (36.3 C). Her blood pressure is 105/64 and her pulse is 79. Her respiration is 16 and oxygen saturation is 99%.   Wt Readings from Last 3 Encounters:  12/11/16 247 lb 12.8 oz (112.4 kg)  11/03/16 244 lb 7.8 oz (110.9 kg)  11/11/16 205 lb (93 kg)    Ocular: Sclerae unicteric, pupils equal, round and reactive to light  Ear-nose-throat: Oropharynx clear, dentition fair Lymphatic: No cervical, supraclavicular or axillary adenopathy Lungs no rales or rhonchi, good excursion bilaterally Heart regular rate and rhythm, no murmur appreciated Abd soft, nontender, positive bowel sounds, no liver or spleen tip palpated, patient guarded on exam MSK no focal spinal tenderness, no joint edema Neuro: non-focal, well-oriented, appropriate affect Breasts: No change.   Lab Results  Component Value Date   WBC 8.6 12/10/2016   HGB 9.4 (L) 12/10/2016   HCT 28.5 (L) 12/10/2016   MCV 79.6 12/10/2016   PLT 144 (L) 12/10/2016   Lab Results  Component Value Date   FERRITIN 102.6 10/27/2016   IRON 37 (L) 10/27/2016   TIBC 335 10/27/2016   UIBC 298 10/27/2016   IRONPCTSAT 11 10/27/2016   Lab Results  Component Value Date   RETICCTPCT 1.37 11/22/2015   RBC 3.58 (L) 12/10/2016   RETICCTABS 61.10 11/22/2015   Lab Results  Component Value Date   KPAFRELGTCHN 3.35 (H) 01/01/2014   LAMBDASER 3.33 (H) 01/01/2014   KAPLAMBRATIO 1.01 01/01/2014   Lab Results  Component Value Date   IGGSERUM 1,220 01/01/2014   IGA 370 01/01/2014   IGMSERUM 105 01/01/2014   Lab Results  Component Value Date   TOTALPROTELP 7.4 01/01/2014   ALBUMINELP 56.1 01/01/2014   A1GS 4.5 01/01/2014   A2GS 9.6 01/01/2014   BETS 6.1 01/01/2014   BETA2SER 5.9 01/01/2014   GAMS  17.8 01/01/2014   MSPIKE NOT DET 01/01/2014   SPEI * 01/01/2014     Chemistry      Component Value Date/Time   NA 139 12/11/2016 0543   NA 136 07/31/2016 1339   NA 139 03/26/2015 1457   K 3.4 (L) 12/11/2016 0543   K 4.1 07/31/2016 1339   K 4.3 03/26/2015 1457   CL 106 12/11/2016 0543   CL 103 07/31/2016 1339   CL 99 03/04/2013 1239   CO2 25 12/11/2016 0543   CO2 27 07/31/2016 1339   CO2 29 03/26/2015 1457   BUN 12 12/11/2016 0543   BUN 9 07/31/2016 1339   BUN 21.1 03/26/2015 1457   CREATININE 0.69 12/11/2016 0543   CREATININE 0.7 07/31/2016 1339   CREATININE 1.0 03/26/2015 1457      Component Value Date/Time   CALCIUM 8.6 (L) 12/11/2016 0543   CALCIUM 9.4 07/31/2016 1339   CALCIUM 9.6 03/26/2015 1457   ALKPHOS 157 (H) 12/11/2016 0543   ALKPHOS 135 (H) 07/31/2016 1339   ALKPHOS 155 (H) 01/01/2014 1400   AST 251 (H) 12/11/2016 0543   AST 35 07/31/2016 1339   AST 32 01/01/2014 1400   ALT 84 (H) 12/11/2016 0543   ALT 41 07/31/2016 1339   ALT 29 01/01/2014 1400   BILITOT 5.9 (H) 12/11/2016 0543   BILITOT 0.50 07/31/2016 1339   BILITOT 0.25 01/01/2014 1400     Impression and Plan: Ms. Yglesias is a 66 yo white female with metastatic breast cancer, liver mets. She received her first dose of Faslodex while in the Hospital and will get her second dose today. Baxter Flattery is working on getting the PA and financial assistance for [employer] sorted out. We will get this started as soon as possible.  She is feeling dehydrated today so we will give her fluids prior to the Faslodex.  Prescription for Diflucan 150 mg PO daily for 3 days and labs were given to her son along with her forms from the nursing home. He will be meeting with the supervisor there tomorrow regarding the mistreatment  of his mother. If the situation does not improve he will be having her moved to another facility.  We will continue to follow along closely with her. Her LFT's are quite elevated so it was also emphasized that she  stay well hydrated. She will continue on the lasix and lactulose. Ammonia level is pending.  We will plan to see her back in 1 week for repeat lab work and follow-up.  Both she and her son know to contact our office with any questions or concerns. We can certainly see her sooner if need be.  This was a shared meeting with Dr. Marin Olp.  Eliezer Bottom, NP 3/28/201811:24 AM   ADDENDUM:  I saw Ms. Dunigan with [facility]. I had seen her in the hospital. She had a difficult hospitalization after having GI bleeding. She was intubated for a while because she has poor lung function. She has metastatic breast cancer confined to the liver. She has a poor performance status. Overall, her performance status is ECOG 3.  She is in a rehabilitation center right now. She is not too happy about being there. However, her family just is not able to care for her at home.  She started Faslodex in the hospital. Her CA 27.29 3 therapy was 730.  The problem is that she has underlying cirrhosis. This is NASH. Her bilirubin has been going up. Today, her bilirubin is 7. Her LFTs are elevated. She has had an elevated ammonia level.  We are trying to get her Verzenio. This is a new oral agents for ER positive breast cancer. It has been clear shown to decrease the need for chemotherapy. She is not a candidate for chemotherapy.  I spoke to her son in private. I told him that I don't know if she is going to make it through this. I told him about her underlying cirrhosis. Her liver could fail before we had time for treatment to work.  She is not a candidate for any intrahepatic therapy because of her underlying cirrhosis.  We are looking at quality of life. I talked to him about her quality of life and our goal of care. We want to focus on trying to improve her quality of life for where it is right now.  He understands all this. He is very appreciative of [place] trying to help her. He is thankful for the compassion that our office  has for his mom.  We will give her some fluids today. I give her some IV pain medication.  We spent almost an hour with her today.  We have to get her back in one week. We have to watch her liver tests very closely. Hopefully, the Verzenio will be approved and not cost a fortune for her.  [name], MD

## 2016-12-14 ENCOUNTER — Encounter: Payer: Self-pay | Admitting: Family

## 2016-12-14 DIAGNOSIS — Z7189 Other specified counseling: Secondary | ICD-10-CM

## 2016-12-14 HISTORY — DX: Other specified counseling: Z71.89

## 2016-12-14 LAB — AMMONIA: Ammonia, Plasma: 88 ug/dL — ABNORMAL HIGH (ref 19–87)

## 2016-12-14 NOTE — Addendum Note (Signed)
Addended by: Burney Gauze R on: 12/14/2016 07:11 AM   Modules accepted: Level of Service

## 2016-12-14 NOTE — Progress Notes (Signed)
Pt discharged to [facility] on 12/11/2016. Medication reconciliation completed by PharmD with [facility] on 12/14/2016. No issues noted.   Angela Burke, PharmD, BCPS Pharmacy Resident Pager: [phone]

## 2016-12-17 ENCOUNTER — Emergency Department (HOSPITAL_COMMUNITY): Payer: Medicare Other

## 2016-12-17 ENCOUNTER — Inpatient Hospital Stay (HOSPITAL_COMMUNITY)
Admission: EM | Admit: 2016-12-17 | Discharge: 2017-01-16 | DRG: 871 | Disposition: E | Payer: Medicare Other | Attending: Pulmonary Disease | Admitting: Pulmonary Disease

## 2016-12-17 ENCOUNTER — Encounter (HOSPITAL_COMMUNITY): Payer: Self-pay | Admitting: Emergency Medicine

## 2016-12-17 ENCOUNTER — Other Ambulatory Visit: Payer: Self-pay

## 2016-12-17 DIAGNOSIS — D509 Iron deficiency anemia, unspecified: Secondary | ICD-10-CM | POA: Diagnosis present

## 2016-12-17 DIAGNOSIS — N179 Acute kidney failure, unspecified: Secondary | ICD-10-CM | POA: Diagnosis present

## 2016-12-17 DIAGNOSIS — K7581 Nonalcoholic steatohepatitis (NASH): Secondary | ICD-10-CM | POA: Diagnosis not present

## 2016-12-17 DIAGNOSIS — E872 Acidosis, unspecified: Secondary | ICD-10-CM

## 2016-12-17 DIAGNOSIS — C787 Secondary malignant neoplasm of liver and intrahepatic bile duct: Secondary | ICD-10-CM | POA: Diagnosis not present

## 2016-12-17 DIAGNOSIS — C50919 Malignant neoplasm of unspecified site of unspecified female breast: Secondary | ICD-10-CM | POA: Diagnosis not present

## 2016-12-17 DIAGNOSIS — J189 Pneumonia, unspecified organism: Secondary | ICD-10-CM | POA: Diagnosis not present

## 2016-12-17 DIAGNOSIS — K7460 Unspecified cirrhosis of liver: Secondary | ICD-10-CM | POA: Diagnosis present

## 2016-12-17 DIAGNOSIS — Z923 Personal history of irradiation: Secondary | ICD-10-CM

## 2016-12-17 DIAGNOSIS — K767 Hepatorenal syndrome: Secondary | ICD-10-CM

## 2016-12-17 DIAGNOSIS — R571 Hypovolemic shock: Secondary | ICD-10-CM | POA: Diagnosis not present

## 2016-12-17 DIAGNOSIS — Z853 Personal history of malignant neoplasm of breast: Secondary | ICD-10-CM

## 2016-12-17 DIAGNOSIS — R188 Other ascites: Secondary | ICD-10-CM | POA: Diagnosis not present

## 2016-12-17 DIAGNOSIS — E876 Hypokalemia: Secondary | ICD-10-CM | POA: Diagnosis not present

## 2016-12-17 DIAGNOSIS — E669 Obesity, unspecified: Secondary | ICD-10-CM | POA: Diagnosis present

## 2016-12-17 DIAGNOSIS — M797 Fibromyalgia: Secondary | ICD-10-CM | POA: Diagnosis present

## 2016-12-17 DIAGNOSIS — E785 Hyperlipidemia, unspecified: Secondary | ICD-10-CM | POA: Diagnosis present

## 2016-12-17 DIAGNOSIS — I5033 Acute on chronic diastolic (congestive) heart failure: Secondary | ICD-10-CM | POA: Diagnosis present

## 2016-12-17 DIAGNOSIS — I130 Hypertensive heart and chronic kidney disease with heart failure and stage 1 through stage 4 chronic kidney disease, or unspecified chronic kidney disease: Secondary | ICD-10-CM | POA: Diagnosis not present

## 2016-12-17 DIAGNOSIS — R6521 Severe sepsis with septic shock: Secondary | ICD-10-CM | POA: Diagnosis present

## 2016-12-17 DIAGNOSIS — I959 Hypotension, unspecified: Secondary | ICD-10-CM | POA: Diagnosis not present

## 2016-12-17 DIAGNOSIS — A419 Sepsis, unspecified organism: Principal | ICD-10-CM | POA: Diagnosis present

## 2016-12-17 DIAGNOSIS — Z825 Family history of asthma and other chronic lower respiratory diseases: Secondary | ICD-10-CM | POA: Diagnosis not present

## 2016-12-17 DIAGNOSIS — Z79899 Other long term (current) drug therapy: Secondary | ICD-10-CM

## 2016-12-17 DIAGNOSIS — Z9221 Personal history of antineoplastic chemotherapy: Secondary | ICD-10-CM

## 2016-12-17 DIAGNOSIS — J9601 Acute respiratory failure with hypoxia: Secondary | ICD-10-CM | POA: Diagnosis present

## 2016-12-17 DIAGNOSIS — M549 Dorsalgia, unspecified: Secondary | ICD-10-CM

## 2016-12-17 DIAGNOSIS — Y95 Nosocomial condition: Secondary | ICD-10-CM | POA: Diagnosis present

## 2016-12-17 DIAGNOSIS — Z90710 Acquired absence of both cervix and uterus: Secondary | ICD-10-CM

## 2016-12-17 DIAGNOSIS — G8929 Other chronic pain: Secondary | ICD-10-CM | POA: Diagnosis present

## 2016-12-17 DIAGNOSIS — Z6841 Body Mass Index (BMI) 40.0 and over, adult: Secondary | ICD-10-CM

## 2016-12-17 DIAGNOSIS — E861 Hypovolemia: Secondary | ICD-10-CM | POA: Diagnosis present

## 2016-12-17 DIAGNOSIS — R069 Unspecified abnormalities of breathing: Secondary | ICD-10-CM | POA: Diagnosis not present

## 2016-12-17 DIAGNOSIS — J9600 Acute respiratory failure, unspecified whether with hypoxia or hypercapnia: Secondary | ICD-10-CM | POA: Diagnosis present

## 2016-12-17 DIAGNOSIS — R600 Localized edema: Secondary | ICD-10-CM | POA: Diagnosis not present

## 2016-12-17 DIAGNOSIS — L03311 Cellulitis of abdominal wall: Secondary | ICD-10-CM | POA: Diagnosis not present

## 2016-12-17 DIAGNOSIS — L0390 Cellulitis, unspecified: Secondary | ICD-10-CM

## 2016-12-17 DIAGNOSIS — R0902 Hypoxemia: Secondary | ICD-10-CM

## 2016-12-17 DIAGNOSIS — Z8249 Family history of ischemic heart disease and other diseases of the circulatory system: Secondary | ICD-10-CM | POA: Diagnosis not present

## 2016-12-17 DIAGNOSIS — N182 Chronic kidney disease, stage 2 (mild): Secondary | ICD-10-CM | POA: Diagnosis present

## 2016-12-17 DIAGNOSIS — Z79818 Long term (current) use of other agents affecting estrogen receptors and estrogen levels: Secondary | ICD-10-CM

## 2016-12-17 DIAGNOSIS — C50911 Malignant neoplasm of unspecified site of right female breast: Secondary | ICD-10-CM | POA: Diagnosis present

## 2016-12-17 DIAGNOSIS — Z515 Encounter for palliative care: Secondary | ICD-10-CM | POA: Diagnosis present

## 2016-12-17 DIAGNOSIS — I2584 Coronary atherosclerosis due to calcified coronary lesion: Secondary | ICD-10-CM

## 2016-12-17 DIAGNOSIS — D638 Anemia in other chronic diseases classified elsewhere: Secondary | ICD-10-CM | POA: Diagnosis present

## 2016-12-17 DIAGNOSIS — F411 Generalized anxiety disorder: Secondary | ICD-10-CM | POA: Diagnosis present

## 2016-12-17 DIAGNOSIS — Z66 Do not resuscitate: Secondary | ICD-10-CM | POA: Diagnosis present

## 2016-12-17 DIAGNOSIS — K219 Gastro-esophageal reflux disease without esophagitis: Secondary | ICD-10-CM | POA: Diagnosis present

## 2016-12-17 DIAGNOSIS — K7290 Hepatic failure, unspecified without coma: Secondary | ICD-10-CM | POA: Diagnosis present

## 2016-12-17 DIAGNOSIS — J383 Other diseases of vocal cords: Secondary | ICD-10-CM | POA: Diagnosis present

## 2016-12-17 DIAGNOSIS — G894 Chronic pain syndrome: Secondary | ICD-10-CM | POA: Diagnosis present

## 2016-12-17 DIAGNOSIS — I2510 Atherosclerotic heart disease of native coronary artery without angina pectoris: Secondary | ICD-10-CM | POA: Diagnosis present

## 2016-12-17 LAB — BRAIN NATRIURETIC PEPTIDE: B Natriuretic Peptide: 165.2 pg/mL — ABNORMAL HIGH (ref 0.0–100.0)

## 2016-12-17 LAB — CBC WITH DIFFERENTIAL/PLATELET
BASOS ABS: 0.1 10*3/uL (ref 0.0–0.1)
Basophils Relative: 1 %
EOS ABS: 0.5 10*3/uL (ref 0.0–0.7)
Eosinophils Relative: 5 %
HEMATOCRIT: 35.4 % — AB (ref 36.0–46.0)
HEMOGLOBIN: 11.4 g/dL — AB (ref 12.0–15.0)
LYMPHS ABS: 2.2 10*3/uL (ref 0.7–4.0)
LYMPHS PCT: 22 %
MCH: 26.7 pg (ref 26.0–34.0)
MCHC: 32.2 g/dL (ref 30.0–36.0)
MCV: 82.9 fL (ref 78.0–100.0)
MONOS PCT: 13 %
Monocytes Absolute: 1.3 10*3/uL — ABNORMAL HIGH (ref 0.1–1.0)
NEUTROS ABS: 6.1 10*3/uL (ref 1.7–7.7)
Neutrophils Relative %: 59 %
Platelets: 200 10*3/uL (ref 150–400)
RBC: 4.27 MIL/uL (ref 3.87–5.11)
RDW: 23.9 % — AB (ref 11.5–15.5)
WBC: 10.2 10*3/uL (ref 4.0–10.5)

## 2016-12-17 LAB — URINALYSIS, ROUTINE W REFLEX MICROSCOPIC
Glucose, UA: NEGATIVE mg/dL
HGB URINE DIPSTICK: NEGATIVE
Ketones, ur: NEGATIVE mg/dL
Leukocytes, UA: NEGATIVE
NITRITE: NEGATIVE
PROTEIN: NEGATIVE mg/dL
SPECIFIC GRAVITY, URINE: 1.019 (ref 1.005–1.030)
pH: 5 (ref 5.0–8.0)

## 2016-12-17 LAB — COMPREHENSIVE METABOLIC PANEL
ALBUMIN: 2.1 g/dL — AB (ref 3.5–5.0)
ALT: 75 U/L — ABNORMAL HIGH (ref 14–54)
ANION GAP: 17 — AB (ref 5–15)
AST: 200 U/L — AB (ref 15–41)
Alkaline Phosphatase: 184 U/L — ABNORMAL HIGH (ref 38–126)
BUN: 18 mg/dL (ref 6–20)
CO2: 22 mmol/L (ref 22–32)
Calcium: 9 mg/dL (ref 8.9–10.3)
Chloride: 100 mmol/L — ABNORMAL LOW (ref 101–111)
Creatinine, Ser: 2.7 mg/dL — ABNORMAL HIGH (ref 0.44–1.00)
GFR calc Af Amer: 20 mL/min — ABNORMAL LOW (ref >60)
GFR calc non Af Amer: 18 mL/min — ABNORMAL LOW (ref >60)
GLUCOSE: 114 mg/dL — AB (ref 65–99)
POTASSIUM: 3 mmol/L — AB (ref 3.5–5.1)
SODIUM: 139 mmol/L (ref 135–145)
Total Bilirubin: 10.4 mg/dL — ABNORMAL HIGH (ref 0.3–1.2)
Total Protein: 7.3 g/dL (ref 6.5–8.1)

## 2016-12-17 LAB — I-STAT ARTERIAL BLOOD GAS, ED
ACID-BASE DEFICIT: 1 mmol/L (ref 0.0–2.0)
Bicarbonate: 24 mmol/L (ref 20.0–28.0)
O2 SAT: 92 %
PCO2 ART: 38.2 mmHg (ref 32.0–48.0)
PO2 ART: 63 mmHg — AB (ref 83.0–108.0)
Patient temperature: 97.7
TCO2: 25 mmol/L (ref 0–100)
pH, Arterial: 7.404 (ref 7.350–7.450)

## 2016-12-17 LAB — I-STAT CG4 LACTIC ACID, ED: Lactic Acid, Venous: 5.48 mmol/L (ref 0.5–1.9)

## 2016-12-17 LAB — AMMONIA: Ammonia: 69 umol/L — ABNORMAL HIGH (ref 9–35)

## 2016-12-17 LAB — PROTIME-INR
INR: 1.61
Prothrombin Time: 19.3 seconds — ABNORMAL HIGH (ref 11.4–15.2)

## 2016-12-17 MED ORDER — NALOXONE HCL 0.4 MG/ML IJ SOLN
0.2000 mg | Freq: Once | INTRAMUSCULAR | Status: AC
  Administered 2016-12-17: 0.2 mg via INTRAVENOUS

## 2016-12-17 MED ORDER — PIPERACILLIN-TAZOBACTAM 3.375 G IVPB 30 MIN
3.3750 g | Freq: Once | INTRAVENOUS | Status: AC
  Administered 2016-12-17: 3.375 g via INTRAVENOUS
  Filled 2016-12-17: qty 50

## 2016-12-17 MED ORDER — VANCOMYCIN HCL 10 G IV SOLR
1500.0000 mg | INTRAVENOUS | Status: DC
  Filled 2016-12-17: qty 1500

## 2016-12-17 MED ORDER — NALOXONE HCL 0.4 MG/ML IJ SOLN
INTRAMUSCULAR | Status: AC
  Filled 2016-12-17: qty 1

## 2016-12-17 MED ORDER — SODIUM CHLORIDE 0.9 % IV BOLUS (SEPSIS)
1000.0000 mL | Freq: Once | INTRAVENOUS | Status: AC
  Administered 2016-12-17: 1000 mL via INTRAVENOUS

## 2016-12-17 MED ORDER — LIDOCAINE HCL (PF) 1 % IJ SOLN: 5.0000 mL | Freq: Once | INTRAMUSCULAR | Status: DC

## 2016-12-17 MED ORDER — PIPERACILLIN-TAZOBACTAM 3.375 G IVPB
3.3750 g | Freq: Three times a day (TID) | INTRAVENOUS | Status: DC
  Administered 2016-12-18 – 2016-12-19 (×5): 3.375 g via INTRAVENOUS
  Filled 2016-12-17 (×6): qty 50

## 2016-12-17 MED ORDER — SODIUM CHLORIDE 0.9 % IV BOLUS (SEPSIS)
250.0000 mL | Freq: Once | INTRAVENOUS | Status: AC
  Administered 2016-12-17: 250 mL via INTRAVENOUS

## 2016-12-17 MED ORDER — VANCOMYCIN HCL 10 G IV SOLR
2000.0000 mg | Freq: Once | INTRAVENOUS | Status: AC
  Administered 2016-12-17: 2000 mg via INTRAVENOUS
  Filled 2016-12-17: qty 2000

## 2016-12-17 NOTE — ED Provider Notes (Signed)
[facility] DEPT Provider Note   CSN: [id number] Arrival date & time: 01/02/2017  2015     History   Chief Complaint Chief Complaint  Patient presents with  . Leg Swelling  . Shortness of Breath  . Altered Mental Status    HPI Alexandria [name] is a 66 y.o. female.  HPI   66 year old female with history of breast cancer with metastasis to the liver, cirrhosis secondary to [facility] complicated by liver metastases, diastolic heart failure, recent admission for Nissen fundoplication followed by a second recent admission with discharge on 3/27 for GI bleed, who presents from her skilled nursing facility for lower extremity swelling.   When EMS arrived at her facility, the patient was noted to have noisy breathing and was satting in the low 80s on room air. She was placed on 4 L of oxygen by nasal cannula, with improvement in oxygen saturation to the mid 90s. Patient endorses a nonproductive cough. She endorses feeling "like I'm full of fluid." Endorses lower abdominal pain "where I have the rash."  Per recent discharge summary, pt is taking augmentin for cellulitis to her pannus. She denies chest pain, nausea, vomiting, diarrhea, blood in her stool, or hematemesis. Chart review reveals ejection fraction of 55-60% on 3/22.  Patient was given a dose of nitroglycerin by EMS en route to the ED for hypoxia and suspected fluid overload. Her blood pressure was 150/90 at the time she was given nitroglycerin, and she subsequently developed hypotension after administration with blood pressure of 70 systolic.  Of note, patient is somnolent on arrival. On review of the medical record, it looks like she suffered from ICU delirium as well as hepatic encephalopathy when recently in the hospital. Home medication list includes lactulose as well as oxycodone.  Past Medical History:  Diagnosis Date  . Anxiety   . Breast cancer ([place]) 10/02/12   Invasive Ductal Carcinoma of the Right Upper Outer Quadrant - ER (>90%),  PR - Neg., Her2 Neu Negative, Ki-67 Unknown  . Chronic back pain    "all the way up and down"  . Chronic bronchitis ([place])    "get it q yr" (09/07/2014)  . Complication of anesthesia    "I come out really anxious; need Ativan to ease me out" (09/07/2014)  . Daily headache    "recently" (09/07/2014)  . DDD (degenerative disc disease), cervical   . DDD (degenerative disc disease), lumbosacral   . DDD (degenerative disc disease), thoracolumbar   . Diastolic dysfunction   . Diastolic heart failure ([place])   . Fibromyalgia   . GAD (generalized anxiety disorder)   . GERD (gastroesophageal reflux disease)   . Goals of care, counseling/discussion 12/14/2016  . H/O hiatal hernia   . Heart murmur   . History of blood transfusion "3-4"   "never can tell why; don't know where the blood was coming from; doesn't show up in stool or urine"  . Hx of pulmonary embolus    During c-Section  . Hx of radiation therapy 03/04/13- 04/17/13   right breast 50 Gy 25 fractions, right breast boost 10 Gy 5 fractions  . Hyperlipidemia   . Hypertension   . Iron deficiency anemia   . Lymphedema    bilateral lower extremity  . Obesity    Class 2  . Peripheral neuropathic pain   . Pneumonia    "at least twice/yr" (09/07/2014)  . PONV (postoperative nausea and vomiting)   . Recurrent major depression ([place]) 06/2014   Seen by Dr.  Jonnalagadda  . S/P radiation therapy  03/04/2013-04/17/2013   1) Right breast / 50 Gy in 25 fractions/ 2) Right breast boost / 10 Gy in 5 fractions  . Status post chemotherapy    4 cycles of Taxotere and cytoxan    Patient Active Problem List   Diagnosis Date Noted  . Goals of care, counseling/discussion 12/14/2016  . Abdominal pain   . Acute blood loss anemia, Coagulopathy & mild thrombocytopenia  11/27/2016  . Electrolyte imbalance 11/27/2016  . Acute hypoxemic respiratory failure (HCC) in setting of hemorrhagic shock. LLL atelectasis. Pulmonary edema.  11/27/2016  . Acute  encephalopathy, both metabolic and hepatic  16/06/9603  . Abnormal liver enzymes. Had CT imaging c/w cirrhosis but Biopsy negative.  11/27/2016  . Encounter for imaging study to confirm orogastric (OG) tube placement   . Encounter for intubation   . Leukocytosis   . Acute upper GI bleed 11/23/2016  . GIB (gastrointestinal bleeding) 11/23/2016  . Metastatic breast cancer ([place]) 11/11/2016  . Status post laparoscopic Nissen fundoplication 54/05/8118  . Asthma 08/21/2016  . Malignant neoplasm of upper-outer quadrant of female breast ([place])   . Hyperlipidemia 10/23/2014  . Essential hypertension   . Acute respiratory failure with hypoxia ([place]) 09/07/2014  . Lymphedema 09/07/2014  . Nocturnal hypoxemia 08/18/2014  . Ventral incisional hernia with obstruction 07/05/2014  . Coronary artery calcification 05/22/2014  . Mediastinal lymphadenopathy 01/29/2014  . Hiatal hernia 01/23/2014  . Symptomatic anemia 11/19/2013  . Hx of radiation therapy   . SVT (supraventricular tachycardia) ([place]) 04/19/2013  . Vocal cord dysfunction 04/15/2013  . Acute on chronic diastolic CHF (congestive heart failure) ([place]) 04/14/2013  . Anxiety state 04/14/2013  . Hypokalemia 04/13/2013  . Iron deficiency anemia 04/13/2013  . HTN (hypertension), Chronic Diastolic HF, h/o HLD and CAD.  04/08/2013  . Chronic back pain 04/08/2013  . Morbid obesity ([place]) 04/08/2013  . Adjustment disorder with mixed anxiety and depressed mood 04/08/2013  . Lower extremity edema 04/08/2013  . Systolic murmur 14/78/2956  . Chronic constipation 04/08/2013  . Breast cancer, right breast ([place]) 02/21/2013    Past Surgical History:  Procedure Laterality Date  . ABDOMINAL HYSTERECTOMY    . APPENDECTOMY    . BREAST BIOPSY Right   . BREAST LUMPECTOMY Right   . CESAREAN SECTION     [phone]  . DILATION AND CURETTAGE OF UTERUS  "numerous"  . ESOPHAGOGASTRODUODENOSCOPY N/A 11/23/2016   Procedure: ESOPHAGOGASTRODUODENOSCOPY (EGD);   Surgeon: Manus Gunning, MD;  Location: [facility];  Service: Gastroenterology;  Laterality: N/A;  . ESOPHAGOGASTRODUODENOSCOPY N/A 11/25/2016   Procedure: ESOPHAGOGASTRODUODENOSCOPY (EGD);  Surgeon: Manus Gunning, MD;  Location: [facility];  Service: Gastroenterology;  Laterality: N/A;  . FOOT SURGERY Right ~ 2013 X 3   "put pins in but pins kept breaking"  . HIATAL HERNIA REPAIR N/A 11/03/2016   Procedure: LAPAROSCOPIC NISSEN AND  REPAIR OF HIATAL HERNIA,;  Surgeon: Johnathan Hausen, MD;  Location: [facility];  Service: General;  Laterality: N/A;  . INGUINAL HERNIA REPAIR Bilateral    "cancer"  . PARTIAL COLECTOMY N/A 07/04/2014   Procedure: REPAIR OF INCARCERATED INCISIONAL HERNIA WITH MESH;  Surgeon: Excell Seltzer, MD;  Location: [facility];  Service: General;  Laterality: N/A;  . TONSILLECTOMY    . UMBILICAL HERNIA REPAIR  X 2    OB History    No data available       Home Medications    Prior to Admission medications   Medication Sig Start Date End  Date Taking? Authorizing Provider  albuterol (PROVENTIL HFA;VENTOLIN HFA) 108 (90 Base) MCG/ACT inhaler Inhale 2 puffs into the lungs every 6 (six) hours as needed for wheezing or shortness of breath. 04/22/16  Yes Gay Filler Copland, MD  carvedilol (COREG) 3.125 MG tablet Take 1 tablet (3.125 mg total) by mouth 2 (two) times daily with a meal. 11/11/16  Yes Oswald Hillock, MD  cyclobenzaprine (FLEXERIL) 10 MG tablet Take 10 mg by mouth at bedtime as needed for muscle spasms.   Yes Historical Provider, MD  escitalopram (LEXAPRO) 10 MG tablet TAKE 1 TABLET BY MOUTH TWICE DAILY 08/25/16  Yes Marletta Lor, MD  furosemide (LASIX) 40 MG tablet Take 1 tablet (40 mg total) by mouth 2 (two) times daily. 12/11/16  Yes Florencia Reasons, MD  lactulose (CHRONULAC) 10 GM/15ML solution Take 45 mLs (30 g total) by mouth 3 (three) times daily. 12/11/16  Yes Florencia Reasons, MD  metolazone (ZAROXOLYN) 2.5 MG tablet Take 1 tablet (2.5 mg total) by mouth daily  as needed (fluid gain). Patient taking differently: Take 2.5 mg by mouth daily as needed (weight gain 3 lbs/24 hours or 5 lbs/7 days).  04/27/16  Yes Thurnell Lose, MD  montelukast (SINGULAIR) 5 MG chewable tablet Chew 1 tablet (5 mg total) by mouth at bedtime. 07/05/16  Yes Javier Glazier, MD  Multiple Vitamin (MULTIVITAMIN WITH MINERALS) TABS tablet Take 1 tablet by mouth daily.   Yes Historical Provider, MD  Multiple Vitamins-Minerals (HAIR/SKIN/NAILS/BIOTIN) TABS Take 1 tablet by mouth 3 (three) times daily. gummies   Yes Historical Provider, MD  Nutritional Supplements (NUTRITIONAL SUPPLEMENT PO) Take 120 mLs by mouth 3 (three) times daily. [employer]   Yes Historical Provider, MD  nystatin cream (MYCOSTATIN) Apply topically 2 (two) times daily. 12/11/16  Yes Florencia Reasons, MD  oxyCODONE (OXY IR/ROXICODONE) 5 MG immediate release tablet Take 1 tablet (5 mg total) by mouth every 6 (six) hours as needed for severe pain. 12/11/16  Yes Florencia Reasons, MD  pantoprazole (PROTONIX) 40 MG tablet Take 1 tablet (40 mg total) by mouth 2 (two) times daily. 12/11/16  Yes Florencia Reasons, MD  potassium chloride SA (K-DUR,KLOR-CON) 20 MEQ tablet Take 2 tablets (40 mEq total) by mouth daily. 12/10/16  Yes Florencia Reasons, MD  ranitidine (ZANTAC) 150 MG tablet Take 1 tablet (150 mg total) by mouth at bedtime. 05/31/15  Yes Javier Glazier, MD  Abemaciclib (VERZENIO) 100 MG TABS Take 100 mg by mouth 2 (two) times daily with a meal. Patient not taking: Reported on 01/08/2017 12/13/16   Volanda Napoleon, MD  feeding supplement (BOOST / RESOURCE BREEZE) LIQD Take 1 Container by mouth 3 (three) times daily with meals. Patient not taking: Reported on 12/31/2016 12/10/16   Florencia Reasons, MD    Family History Family History  Problem Relation Age of Onset  . Parkinson's disease Mother   . Emphysema Maternal Grandfather   . COPD Maternal Grandfather   . COPD Sister   . Heart attack Sister   . COPD Maternal Grandmother   . CAD Neg Hx     Social  History Social History  Substance Use Topics  . Smoking status: Never Smoker  . Smokeless tobacco: Never Used     Comment: Through father  . Alcohol use No     Allergies   Contrast media [iodinated diagnostic agents]; Solu-medrol [methylprednisolone acetate]; Albuterol; and Prednisone   Review of Systems Review of Systems  Constitutional: Negative for chills and fever.  HENT: Negative  for congestion.   Eyes: Negative for visual disturbance.  Respiratory: Positive for shortness of breath. Negative for cough and wheezing.   Cardiovascular: Positive for leg swelling. Negative for chest pain and palpitations.  Gastrointestinal: Positive for abdominal distention and abdominal pain. Negative for diarrhea, nausea and vomiting.  Genitourinary: Negative for dysuria, flank pain, frequency and hematuria.  Musculoskeletal: Negative for arthralgias, back pain, myalgias, neck pain and neck stiffness.  Skin: Positive for rash (to the pannus). Negative for color change.  Neurological: Negative for light-headedness and headaches.  Psychiatric/Behavioral: Negative for agitation, behavioral problems and confusion.     Physical Exam Updated Vital Signs BP 91/64   Pulse 70   Temp 97.7 F (36.5 C) (Rectal)   Resp 14   LMP 01/30/2005   SpO2 90%   Physical Exam  Constitutional: She appears well-developed and well-nourished.  Slow to answer questions  HENT:  Head: Normocephalic and atraumatic.  Eyes: Conjunctivae are normal.  Neck: Normal range of motion. Neck supple.  Cardiovascular: Normal rate, regular rhythm, normal heart sounds and intact distal pulses.   No murmur heard. Pulmonary/Chest: Effort normal. She has no wheezes. She has no rales.  Crackles at the bilateral lung bases  Abdominal: Soft. Bowel sounds are normal. She exhibits distension. There is tenderness (cellulitis present across the pannus, with associated TTP of the region with overlying skin changes).  Musculoskeletal:  She exhibits edema (3+ pitting edema to the bilateral LEs).  Neurological:  Pt is oriented x4, but slow to answer questions. Eventually answers questions appropriately. Moves all 4 extremities.   Skin: Skin is warm and dry.  Psychiatric: She has a normal mood and affect.  Nursing note and vitals reviewed.    ED Treatments / Results  Labs (all labs ordered are listed, but only abnormal results are displayed) Labs Reviewed  CBC WITH DIFFERENTIAL/PLATELET - Abnormal; Notable for the following:       Result Value   Hemoglobin 11.4 (*)    HCT 35.4 (*)    RDW 23.9 (*)    Monocytes Absolute 1.3 (*)    All other components within normal limits  COMPREHENSIVE METABOLIC PANEL - Abnormal; Notable for the following:    Potassium 3.0 (*)    Chloride 100 (*)    Glucose, Bld 114 (*)    Creatinine, Ser 2.70 (*)    Albumin 2.1 (*)    AST 200 (*)    ALT 75 (*)    Alkaline Phosphatase 184 (*)    Total Bilirubin 10.4 (*)    GFR calc non Af Amer 18 (*)    GFR calc Af Amer 20 (*)    Anion gap 17 (*)    All other components within normal limits  AMMONIA - Abnormal; Notable for the following:    Ammonia 69 (*)    All other components within normal limits  BRAIN NATRIURETIC PEPTIDE - Abnormal; Notable for the following:    B Natriuretic Peptide 165.2 (*)    All other components within normal limits  PROTIME-INR - Abnormal; Notable for the following:    Prothrombin Time 19.3 (*)    All other components within normal limits  URINALYSIS, ROUTINE W REFLEX MICROSCOPIC - Abnormal; Notable for the following:    Color, Urine AMBER (*)    APPearance HAZY (*)    Bilirubin Urine MODERATE (*)    All other components within normal limits  I-STAT ARTERIAL BLOOD GAS, ED - Abnormal; Notable for the following:    pO2, Arterial 63.0 (*)  All other components within normal limits  I-STAT CG4 LACTIC ACID, ED - Abnormal; Notable for the following:    Lactic Acid, Venous 5.48 (*)    All other components  within normal limits  I-STAT CG4 LACTIC ACID, ED - Abnormal; Notable for the following:    Lactic Acid, Venous 4.31 (*)    All other components within normal limits  CULTURE, BLOOD (ROUTINE X 2)  CULTURE, BLOOD (ROUTINE X 2)  URINE CULTURE    EKG  EKG Interpretation None       Radiology Dg Chest Port 1 View  Result Date: 01/14/2017 CLINICAL DATA:  Initial evaluation for acute hypoxia, increased leg swelling. EXAM: PORTABLE CHEST 1 VIEW COMPARISON:  None available. FINDINGS: Examination is suboptimal due to shallow degree of lung inflation. Cardiac and mediastinal silhouettes grossly within normal limits. Lungs hypoinflated. Secondary bibasilar bronchovascular crowding with atelectasis. Perihilar vascular congestion suspected to in part be related to shallow degree of lung inflation. No overt pulmonary edema. No other focal infiltrates. No pneumothorax. No acute osseous abnormality. Surgical clips overlie the right axilla. IMPRESSION: 1. Shallow lung inflation with secondary bibasilar atelectasis and bronchovascular crowding. 2. No other definite active cardiopulmonary disease. Electronically Signed   By: Jeannine Boga M.D.   On: 01/12/2017 21:10    Procedures Procedures (including critical care time)  Medications Ordered in ED Medications  piperacillin-tazobactam (ZOSYN) IVPB 3.375 g (not administered)  vancomycin (VANCOCIN) 1,500 mg in sodium chloride 0.9 % 500 mL IVPB (not administered)  lidocaine (PF) (XYLOCAINE) 1 % injection 5 mL (not administered)  naloxone (NARCAN) injection 0.2 mg (0.2 mg Intravenous Given 12/24/2016 2035)  sodium chloride 0.9 % bolus 1,000 mL (0 mLs Intravenous Stopped 12/27/2016 2343)  piperacillin-tazobactam (ZOSYN) IVPB 3.375 g (0 g Intravenous Stopped 01/06/2017 2240)  vancomycin (VANCOCIN) 2,000 mg in sodium chloride 0.9 % 500 mL IVPB (0 mg Intravenous Stopped 12/18/16 0010)  sodium chloride 0.9 % bolus 1,000 mL (0 mLs Intravenous Stopped 12/18/16 0124)  sodium  chloride 0.9 % bolus 250 mL (0 mLs Intravenous Stopped 12/18/16 0124)     Initial Impression / Assessment and Plan / ED Course  I have reviewed the triage vital signs and the nursing notes.  Pertinent labs & imaging results that were available during my care of the patient were reviewed by me and considered in my medical decision making (see chart for details).     Afebrile via rectal temp. Patient is initially hypotensive to 70 systolic on arrival after receiving nitroglycerin, with transient normalization of blood pressure, then subsequent decline in blood pressure to 80 systolic. Patient endorses abdominal tenderness over her lower abdomen. She is noted to have cellulitis in this region. On review of the medical record, she is currently taking Augmentin for this infection.  Lung exam reveals crackles at the bases. Patient is requiring 3 L of oxygen by nasal cannula to maintain SPO2 in the low 90s.  Sepsis workup initiated, with blood and urine cultures drawn. Pt has an initial lactic acidosis to 5.48. It is unclear whether this is in the setting of hypoperfusion from sepsis vs. poor clearance from liver failure.  CBC with no leukocytosis. Hemoglobin improved to 11.4 and patient denies hematemesis or blood in her stool. UA not indicative of UTI. CMP shows a new onset renal failure, with creatinine of 2.7 from recent normal baseline. Patient is mildly hypokalemic with potassium of 3.0. AST and ALT are elevated as well as alkaline phosphatase and total bilirubin. Suspect hepatorenal failure. Patient's  ammonia is also elevated to 69, which would explain her slowness to answer questions.  ABG shows pH of 7.4, PCO2 of 38.2, PaO2 of 63. No evidence of hypercarbic respiratory failure. Chest x-ray with bibasilar atelectasis but no focal consolidation to suggest pneumonia.  Patient has large volume ascites on ultrasound, however unfortunately diagnostic paracentesis cannot be safely performed secondary  to cellulitis over the abdomen. Will treat empirically for possible SBP or other infectious source with vancomycin and Zosyn. Differential diagnosis for clinical picture includes sepsis vs. decompensated hepatorenal failure.  Pt given 30 mL/kg of normal saline for lactic acidosis and soft blood pressures. BP is fluid responsive, and MAPs stayed >65. Patient will be admitted to critical care for further management. Patient's son, Alexandria [name], was updated as to the course of her care by phone.  Care of patient overseen by my attending, Dr. Billy Fischer.  Final Clinical Impressions(s) / ED Diagnoses   Final diagnoses:  Lactic acidosis  Hepatorenal failure (HCC)  Cellulitis, unspecified cellulitis site    New Prescriptions New Prescriptions   No medications on file     Jenifer Algernon Huxley, MD 12/18/16 0131    Gareth Morgan, MD 12/23/16 [phone]

## 2016-12-17 NOTE — ED Triage Notes (Signed)
Pt presents from [facility] for incresed leg swelling per staff. EMS arrived and stated patient was sating 82% on RA and staff stated she has become more lethargic over the day. Was given 1 SL nirto

## 2016-12-17 NOTE — Progress Notes (Signed)
Pharmacy Antibiotic Note Alexandria [name] is a 66 y.o. female admitted on 01/04/2017 with sepsis.  Pharmacy has been consulted for Zosyn and vancomycin dosing.  Plan: Vancomycin 1500 IV every 48 hours.  Goal trough 15-20 mcg/mL. Zosyn 3.375g IV q8h (4 hour infusion).  Follow up renal fxn and adjust abx as needed      Temp (24hrs), Avg:97.7 F (36.5 C), Min:97.7 F (36.5 C), Max:97.7 F (36.5 C)   Recent Labs Lab 12/11/16 0543 12/13/16 1029 12/13/16 1029 01/01/2017 2121 12/19/2016 2128  WBC  --  9.8  --  10.2  --   CREATININE 0.69  --  1.2 2.70*  --   LATICACIDVEN  --   --   --   --  5.48*    Estimated Creatinine Clearance: 27.1 mL/min (A) (by C-G formula based on SCr of 2.7 mg/dL (H)).    Allergies  Allergen Reactions  . Contrast Media [Iodinated Diagnostic Agents] Anaphylaxis  . Solu-Medrol [Methylprednisolone Acetate] Other (See Comments)    Pt. States she is not allergic, just can't tolerate medication well.   . Albuterol Other (See Comments)    Anxious   . Prednisone Other (See Comments)    Pt gets very agitated when she takes high doses of steroids    Antimicrobials this admission: 4/1 Zosyn >>  4/1 vancomycin >>    Microbiology results: px  Thank you for allowing pharmacy to be a part of this patient's care.  Vincenza Hews, PharmD, BCPS 01/01/2017, 10:19 PM

## 2016-12-17 NOTE — ED Notes (Signed)
Ronalee Belts (son) - [phone]

## 2016-12-17 NOTE — Progress Notes (Signed)
ABG collected  

## 2016-12-18 DIAGNOSIS — C50919 Malignant neoplasm of unspecified site of unspecified female breast: Secondary | ICD-10-CM | POA: Diagnosis not present

## 2016-12-18 DIAGNOSIS — Z7189 Other specified counseling: Secondary | ICD-10-CM | POA: Diagnosis not present

## 2016-12-18 DIAGNOSIS — J189 Pneumonia, unspecified organism: Secondary | ICD-10-CM | POA: Diagnosis present

## 2016-12-18 DIAGNOSIS — Z515 Encounter for palliative care: Secondary | ICD-10-CM | POA: Diagnosis not present

## 2016-12-18 DIAGNOSIS — R600 Localized edema: Secondary | ICD-10-CM | POA: Diagnosis not present

## 2016-12-18 DIAGNOSIS — K7460 Unspecified cirrhosis of liver: Secondary | ICD-10-CM | POA: Diagnosis present

## 2016-12-18 DIAGNOSIS — J9601 Acute respiratory failure with hypoxia: Secondary | ICD-10-CM

## 2016-12-18 DIAGNOSIS — C787 Secondary malignant neoplasm of liver and intrahepatic bile duct: Secondary | ICD-10-CM | POA: Diagnosis not present

## 2016-12-18 DIAGNOSIS — N179 Acute kidney failure, unspecified: Secondary | ICD-10-CM

## 2016-12-18 DIAGNOSIS — R188 Other ascites: Secondary | ICD-10-CM | POA: Diagnosis present

## 2016-12-18 DIAGNOSIS — E876 Hypokalemia: Secondary | ICD-10-CM | POA: Diagnosis present

## 2016-12-18 DIAGNOSIS — I130 Hypertensive heart and chronic kidney disease with heart failure and stage 1 through stage 4 chronic kidney disease, or unspecified chronic kidney disease: Secondary | ICD-10-CM | POA: Diagnosis present

## 2016-12-18 DIAGNOSIS — Z8249 Family history of ischemic heart disease and other diseases of the circulatory system: Secondary | ICD-10-CM | POA: Diagnosis not present

## 2016-12-18 DIAGNOSIS — A419 Sepsis, unspecified organism: Secondary | ICD-10-CM | POA: Diagnosis not present

## 2016-12-18 DIAGNOSIS — R6521 Severe sepsis with septic shock: Secondary | ICD-10-CM

## 2016-12-18 DIAGNOSIS — Y95 Nosocomial condition: Secondary | ICD-10-CM | POA: Diagnosis present

## 2016-12-18 DIAGNOSIS — I5033 Acute on chronic diastolic (congestive) heart failure: Secondary | ICD-10-CM | POA: Diagnosis present

## 2016-12-18 DIAGNOSIS — E872 Acidosis: Secondary | ICD-10-CM | POA: Diagnosis present

## 2016-12-18 DIAGNOSIS — F411 Generalized anxiety disorder: Secondary | ICD-10-CM | POA: Diagnosis present

## 2016-12-18 DIAGNOSIS — Z923 Personal history of irradiation: Secondary | ICD-10-CM | POA: Diagnosis not present

## 2016-12-18 DIAGNOSIS — Z825 Family history of asthma and other chronic lower respiratory diseases: Secondary | ICD-10-CM | POA: Diagnosis not present

## 2016-12-18 DIAGNOSIS — E861 Hypovolemia: Secondary | ICD-10-CM | POA: Diagnosis present

## 2016-12-18 DIAGNOSIS — K767 Hepatorenal syndrome: Secondary | ICD-10-CM | POA: Diagnosis not present

## 2016-12-18 DIAGNOSIS — R571 Hypovolemic shock: Secondary | ICD-10-CM | POA: Diagnosis present

## 2016-12-18 DIAGNOSIS — Z6841 Body Mass Index (BMI) 40.0 and over, adult: Secondary | ICD-10-CM | POA: Diagnosis not present

## 2016-12-18 DIAGNOSIS — K219 Gastro-esophageal reflux disease without esophagitis: Secondary | ICD-10-CM | POA: Diagnosis present

## 2016-12-18 DIAGNOSIS — I959 Hypotension, unspecified: Secondary | ICD-10-CM | POA: Diagnosis not present

## 2016-12-18 DIAGNOSIS — K7581 Nonalcoholic steatohepatitis (NASH): Secondary | ICD-10-CM | POA: Diagnosis present

## 2016-12-18 DIAGNOSIS — Z853 Personal history of malignant neoplasm of breast: Secondary | ICD-10-CM | POA: Diagnosis not present

## 2016-12-18 DIAGNOSIS — Z9221 Personal history of antineoplastic chemotherapy: Secondary | ICD-10-CM | POA: Diagnosis not present

## 2016-12-18 DIAGNOSIS — Z90710 Acquired absence of both cervix and uterus: Secondary | ICD-10-CM | POA: Diagnosis not present

## 2016-12-18 DIAGNOSIS — R0602 Shortness of breath: Secondary | ICD-10-CM | POA: Insufficient documentation

## 2016-12-18 DIAGNOSIS — J9600 Acute respiratory failure, unspecified whether with hypoxia or hypercapnia: Secondary | ICD-10-CM | POA: Diagnosis present

## 2016-12-18 LAB — PROCALCITONIN: PROCALCITONIN: 1.39 ng/mL

## 2016-12-18 LAB — I-STAT CG4 LACTIC ACID, ED: Lactic Acid, Venous: 4.31 mmol/L (ref 0.5–1.9)

## 2016-12-18 LAB — BASIC METABOLIC PANEL
Anion gap: 13 (ref 5–15)
BUN: 18 mg/dL (ref 6–20)
CHLORIDE: 105 mmol/L (ref 101–111)
CO2: 24 mmol/L (ref 22–32)
Calcium: 8.7 mg/dL — ABNORMAL LOW (ref 8.9–10.3)
Creatinine, Ser: 2.65 mg/dL — ABNORMAL HIGH (ref 0.44–1.00)
GFR calc Af Amer: 21 mL/min — ABNORMAL LOW (ref >60)
GFR calc non Af Amer: 18 mL/min — ABNORMAL LOW (ref >60)
GLUCOSE: 100 mg/dL — AB (ref 65–99)
POTASSIUM: 3.2 mmol/L — AB (ref 3.5–5.1)
SODIUM: 142 mmol/L (ref 135–145)

## 2016-12-18 LAB — CBC
HEMATOCRIT: 33.3 % — AB (ref 36.0–46.0)
HEMOGLOBIN: 10.7 g/dL — AB (ref 12.0–15.0)
MCH: 26.8 pg (ref 26.0–34.0)
MCHC: 32.1 g/dL (ref 30.0–36.0)
MCV: 83.5 fL (ref 78.0–100.0)
Platelets: 156 10*3/uL (ref 150–400)
RBC: 3.99 MIL/uL (ref 3.87–5.11)
RDW: 24.1 % — ABNORMAL HIGH (ref 11.5–15.5)
WBC: 14.4 10*3/uL — ABNORMAL HIGH (ref 4.0–10.5)

## 2016-12-18 LAB — LACTIC ACID, PLASMA: Lactic Acid, Venous: 2.9 mmol/L (ref 0.5–1.9)

## 2016-12-18 LAB — MRSA PCR SCREENING: MRSA by PCR: NEGATIVE

## 2016-12-18 MED ORDER — ORAL CARE MOUTH RINSE
15.0000 mL | Freq: Two times a day (BID) | OROMUCOSAL | Status: DC
  Administered 2016-12-18 – 2016-12-21 (×8): 15 mL via OROMUCOSAL

## 2016-12-18 MED ORDER — ESCITALOPRAM OXALATE 10 MG PO TABS
10.0000 mg | ORAL_TABLET | Freq: Two times a day (BID) | ORAL | Status: DC
  Administered 2016-12-18 – 2016-12-19 (×4): 10 mg via ORAL
  Filled 2016-12-18 (×4): qty 1

## 2016-12-18 MED ORDER — ALBUTEROL SULFATE (2.5 MG/3ML) 0.083% IN NEBU: 3.0000 mL | INHALATION_SOLUTION | Freq: Four times a day (QID) | RESPIRATORY_TRACT | Status: DC | PRN

## 2016-12-18 MED ORDER — ASPIRIN 81 MG PO CHEW
324.0000 mg | CHEWABLE_TABLET | ORAL | Status: AC
  Administered 2016-12-18: 324 mg via ORAL
  Filled 2016-12-18: qty 4

## 2016-12-18 MED ORDER — SODIUM CHLORIDE 0.9 % IV BOLUS (SEPSIS)
1000.0000 mL | INTRAVENOUS | Status: AC
Start: 2016-12-18 — End: 2016-12-18
  Administered 2016-12-18: 1000 mL via INTRAVENOUS

## 2016-12-18 MED ORDER — SODIUM CHLORIDE 0.9 % IV SOLN
250.0000 mL | INTRAVENOUS | Status: DC | PRN
  Administered 2016-12-18: 250 mL via INTRAVENOUS

## 2016-12-18 MED ORDER — HEPARIN SODIUM (PORCINE) 5000 UNIT/ML IJ SOLN
5000.0000 [IU] | Freq: Three times a day (TID) | INTRAMUSCULAR | Status: DC
  Administered 2016-12-18 – 2016-12-19 (×4): 5000 [IU] via SUBCUTANEOUS
  Filled 2016-12-18 (×4): qty 1

## 2016-12-18 MED ORDER — ADULT MULTIVITAMIN W/MINERALS CH
1.0000 | ORAL_TABLET | Freq: Every day | ORAL | Status: DC
  Administered 2016-12-18 – 2016-12-19 (×2): 1 via ORAL
  Filled 2016-12-18 (×2): qty 1

## 2016-12-18 MED ORDER — NYSTATIN 100000 UNIT/GM EX CREA
TOPICAL_CREAM | Freq: Two times a day (BID) | CUTANEOUS | Status: DC
  Administered 2016-12-18: 1 via TOPICAL
  Administered 2016-12-18: 23:00:00 via TOPICAL
  Administered 2016-12-19 (×2): 1 via TOPICAL
  Administered 2016-12-22: 03:00:00 via TOPICAL
  Filled 2016-12-18 (×2): qty 15

## 2016-12-18 MED ORDER — POTASSIUM CHLORIDE CRYS ER 20 MEQ PO TBCR
40.0000 meq | EXTENDED_RELEASE_TABLET | Freq: Every day | ORAL | Status: DC
Start: 2016-12-18 — End: 2016-12-18

## 2016-12-18 MED ORDER — ASPIRIN 300 MG RE SUPP: 300.0000 mg | RECTAL | Status: AC

## 2016-12-18 MED ORDER — POTASSIUM CHLORIDE CRYS ER 20 MEQ PO TBCR
40.0000 meq | EXTENDED_RELEASE_TABLET | Freq: Once | ORAL | Status: AC
  Administered 2016-12-18: 40 meq via ORAL
  Filled 2016-12-18: qty 2

## 2016-12-18 MED ORDER — PHENYLEPHRINE HCL 10 MG/ML IJ SOLN
0.0000 ug/min | INTRAMUSCULAR | Status: DC
  Administered 2016-12-18 (×2): 20 ug/min via INTRAVENOUS
  Administered 2016-12-18: 40 ug/min via INTRAVENOUS
  Administered 2016-12-19 (×2): 25 ug/min via INTRAVENOUS
  Filled 2016-12-18 (×7): qty 1

## 2016-12-18 MED ORDER — FAMOTIDINE 20 MG PO TABS
10.0000 mg | ORAL_TABLET | Freq: Every day | ORAL | Status: DC
  Administered 2016-12-18 – 2016-12-19 (×2): 10 mg via ORAL
  Filled 2016-12-18 (×2): qty 1

## 2016-12-18 MED ORDER — LACTULOSE 10 GM/15ML PO SOLN
30.0000 g | Freq: Three times a day (TID) | ORAL | Status: DC
  Administered 2016-12-18 – 2016-12-19 (×4): 30 g via ORAL
  Filled 2016-12-18 (×4): qty 45

## 2016-12-18 MED ORDER — FENTANYL CITRATE (PF) 100 MCG/2ML IJ SOLN
25.0000 ug | INTRAMUSCULAR | Status: DC | PRN
  Administered 2016-12-18 – 2016-12-19 (×2): 25 ug via INTRAVENOUS
  Filled 2016-12-18 (×3): qty 2

## 2016-12-18 MED ORDER — SODIUM CHLORIDE 0.9 % IV BOLUS (SEPSIS)
500.0000 mL | Freq: Once | INTRAVENOUS | Status: AC
  Administered 2016-12-18: 500 mL via INTRAVENOUS

## 2016-12-18 MED ORDER — PANTOPRAZOLE SODIUM 40 MG PO TBEC
40.0000 mg | DELAYED_RELEASE_TABLET | Freq: Two times a day (BID) | ORAL | Status: DC
  Administered 2016-12-18 – 2016-12-19 (×4): 40 mg via ORAL
  Filled 2016-12-18 (×4): qty 1

## 2016-12-18 MED ORDER — MONTELUKAST SODIUM 5 MG PO CHEW
5.0000 mg | CHEWABLE_TABLET | Freq: Every day | ORAL | Status: DC
  Administered 2016-12-18 – 2016-12-19 (×2): 5 mg via ORAL
  Filled 2016-12-18 (×2): qty 1

## 2016-12-18 NOTE — Progress Notes (Signed)
eLink Physician-Brief Progress Note Patient Name: Alexandria [name] DOB: Nov 11, 1950 MRN: [id number]   Date of Service  12/18/2016  HPI/Events of Note  Pain - patient on Oxycodone at home.   eICU Interventions  Will order: 1. Fentanyl 25-50 mcg IV Q 2 hours PRN pain.      Intervention Category Intermediate Interventions: Pain - evaluation and management  Lysle Dingwall 12/18/2016, 6:39 PM

## 2016-12-18 NOTE — ED Notes (Signed)
[facility] ED MD and Tamala Julian, MD hospitalist service notified of the patients current BP.

## 2016-12-18 NOTE — Progress Notes (Signed)
[facility] Progress Note Patient Name: Alexandria [name] DOB: [birth year] MRN: [id number]   Date of Service  12/18/2016  HPI/Events of Note  Hypotension, now on neosynephrine  eICU Interventions  Check lactic acid again     Intervention Category Major Interventions: Shock - evaluation and management  Simonne Maffucci 12/18/2016, 6:12 AM

## 2016-12-18 NOTE — Progress Notes (Signed)
PCCM Interval Note  I spoke with the patient's husband Peterson Ao by phone. I updated him on the patient's current status, acute and chronic issues. He indicated to me that he and the patient's son Legrand Como have been helping to make medical decisions for Ms Salvaggio. They have been discussing her overall status and had been interested in possibly obtaining outpatient hospice care. He was interested in getting a Palliative Care consult while admitted which I will do. With regard to her current goals for care I recommended that we defer invasive procedures, continue her current medical care including pressors via PIV, attempt[t to treat reversible processes. He agrees. I will place limited code orders on the chart. He will communicate our discussions to Legrand Como today. I have asked them to anticipate a call from Palliative Care team sometime soon.   Baltazar Apo, MD, PhD 12/18/2016, 11:00 AM [facility] [phone] or if no answer [phone]

## 2016-12-18 NOTE — Progress Notes (Signed)
Palliative:  I have left a message for husband Mr. Malatesta and will await for return phone call to hopefully meet tomorrow 12/19/16 to discuss [place]. Thank you for this consult.   No charge  Vinie Sill, NP Palliative Medicine Team Pager # [phone] (M-F 8a-5p) Team Phone # [phone] (Nights/Weekends)

## 2016-12-18 NOTE — Progress Notes (Signed)
LA of 2.9 reported to Dr Lamonte Sakai

## 2016-12-18 NOTE — Progress Notes (Signed)
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 Also had metabolic encephalopathy from ICU delirium and cirrhosis along with acute CHF, pannus cellulitis. Discharged on 3/27 to SNF with augmentin for cellulitis, lactulose for cirrhosis, and lasix 40mg  bid for CHF.   Today she presented from SNF for lower ext swelling (DVT study was negative last admission) and was hypoxic to 80's on room Air. Placed on 4L o2 with improvement of sat. Her BP was 150/90 but EMS gave her nitro en route which lower her BP and despite fluid it remained low in mid 90's SBP.   We were called by ER provider to evaluate the patient for ICU admission. I have seen the patient with the CCM Fellow Dr. Minette Brine and discussed the plan with E-link provider Dr. Lake Bells.   At this time, the patient does not need ICU admission. Her BP is likely low at baseline from her cirrhosis and it was lowered more with the nitro. Currently her MAP is ~65 and likely will improve with time. Her lactic acid level is improving with the fluid. She is awake and protecting her airways and oxygenating ok on [place].   We recommend admission to step down at this time. If you need any assistance, please call [place] back.   Dellia Nims, PGY-3 PCCM Resident

## 2016-12-18 NOTE — Progress Notes (Signed)
[facility] Progress Note Patient Name: LOUIS GAW DOB: [birth year] MRN: [id number]   Date of Service  12/18/2016  HPI/Events of Note    eICU Interventions  Hypokalemia, repleted     Intervention Category Intermediate Interventions: Electrolyte abnormality - evaluation and management  Simonne Maffucci 12/18/2016, 6:52 AM

## 2016-12-18 NOTE — H&P (Signed)
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 Also had metabolic encephalopathy from ICU delirium and cirrhosis along with acute CHF, pannus cellulitis. Discharged on 3/27 to SNF with augmentin for cellulitis, lactulose for cirrhosis, and lasix 40mg  bid for CHF.   Today she presented from SNF for lower ext swelling (DVT study was negative last admission) and was hypoxic to 80's on room Air. Placed on 4L o2 with improvement of sat. Her BP was 150/90 but EMS gave her nitro en route which lower her BP and despite fluid it remained low in mid 90's SBP and required admission to ICU for persistent hypotension.    PAST MEDICAL HISTORY :  She  has a past medical history of Anxiety; Breast cancer ([place]) (10/02/12); Chronic back pain; Chronic bronchitis ([place]); Complication of anesthesia; Daily headache; DDD (degenerative disc disease), cervical; DDD (degenerative disc disease), lumbosacral; DDD (degenerative disc disease), thoracolumbar; Diastolic dysfunction; Diastolic heart failure ([place]); Fibromyalgia; GAD (generalized anxiety disorder); GERD (gastroesophageal reflux disease); Goals of care, counseling/discussion (12/14/2016); H/O hiatal hernia; Heart murmur; History of blood transfusion ("3-4"); pulmonary embolus; radiation therapy (03/04/13- 04/17/13); Hyperlipidemia; Hypertension; Iron deficiency anemia; Lymphedema; Obesity;  Peripheral neuropathic pain; Pneumonia; PONV (postoperative nausea and vomiting); Recurrent major depression ([place]) (06/2014); S/P radiation therapy ( 03/04/2013-04/17/2013); and Status post chemotherapy.  PAST SURGICAL HISTORY: She  has a past surgical history that includes Cesarean section; Partial colectomy (N/A, 07/04/2014); Tonsillectomy; Appendectomy; Breast biopsy (Right); Breast lumpectomy (Right); Inguinal hernia repair (Bilateral); Umbilical hernia repair (X 2); Dilation and curettage of uterus ("numerous"); Abdominal hysterectomy; Foot surgery (Right, ~ 2013 X 3); Hiatal hernia repair (N/A, 11/03/2016); Esophagogastroduodenoscopy (N/A, 11/23/2016); and Esophagogastroduodenoscopy (N/A, 11/25/2016).  Allergies  Allergen Reactions  . Contrast Media [Iodinated Diagnostic Agents] Anaphylaxis  . Solu-Medrol [Methylprednisolone Acetate] Other (See Comments)    Pt. States she is not allergic, just can't tolerate medication well.   . Albuterol Other (See Comments)    Anxious   . Prednisone Other (See Comments)    Pt gets very agitated when she takes high doses of steroids    No current facility-administered medications on file prior to encounter.    Current Outpatient Prescriptions on File Prior to Encounter  Medication Sig  . albuterol (PROVENTIL HFA;VENTOLIN HFA) 108 (90 Base) MCG/ACT inhaler Inhale 2 puffs into the lungs every 6 (six) hours as needed for wheezing or shortness of breath.  . carvedilol (COREG) 3.125 MG tablet Take 1 tablet (3.125 mg total) by mouth 2 (two) times daily with a meal.  . cyclobenzaprine (FLEXERIL) 10 MG tablet Take 10 mg by mouth at bedtime as needed for muscle spasms.  Marland Kitchen escitalopram (LEXAPRO) 10 MG tablet TAKE 1 TABLET BY MOUTH TWICE DAILY  . furosemide (LASIX) 40 MG tablet Take 1 tablet (40 mg total) by mouth 2 (two) times daily.  Marland Kitchen lactulose (CHRONULAC) 10 GM/15ML solution Take 45 mLs (30 g total) by mouth 3 (three) times daily.  Marland Kitchen  metolazone (ZAROXOLYN) 2.5 MG  tablet Take 1 tablet (2.5 mg total) by mouth daily as needed (fluid gain). (Patient taking differently: Take 2.5 mg by mouth daily as needed (weight gain 3 lbs/24 hours or 5 lbs/7 days). )  . montelukast (SINGULAIR) 5 MG chewable tablet Chew 1 tablet (5 mg total) by mouth at bedtime.  . Multiple Vitamin (MULTIVITAMIN WITH MINERALS) TABS tablet Take 1 tablet by mouth daily.  Marland Kitchen nystatin cream (MYCOSTATIN) Apply topically 2 (two) times daily.  Marland Kitchen oxyCODONE (OXY IR/ROXICODONE) 5 MG immediate release tablet Take 1 tablet (5 mg total) by mouth every 6 (six) hours as needed for severe pain.  . pantoprazole (PROTONIX) 40 MG tablet Take 1 tablet (40 mg total) by mouth 2 (two) times daily.  . potassium chloride SA (K-DUR,KLOR-CON) 20 MEQ tablet Take 2 tablets (40 mEq total) by mouth daily.  . ranitidine (ZANTAC) 150 MG tablet Take 1 tablet (150 mg total) by mouth at bedtime.  . Abemaciclib (VERZENIO) 100 MG TABS Take 100 mg by mouth 2 (two) times daily with a meal. (Patient not taking: Reported on 12/27/2016)  . feeding supplement (BOOST / RESOURCE BREEZE) LIQD Take 1 Container by mouth 3 (three) times daily with meals. (Patient not taking: Reported on 12/21/2016)    FAMILY HISTORY:  Her indicated that her mother is deceased. She indicated that her father is deceased. She indicated that the status of her sister is unknown. She indicated that the status of her maternal grandmother is unknown. She indicated that the status of her maternal grandfather is unknown. She indicated that the status of her neg hx is unknown.    SOCIAL HISTORY: She  reports that she has never smoked. She has never used smokeless tobacco. She reports that she does not drink alcohol or use drugs.  REVIEW OF SYSTEMS:   Patient is unable to provide full ROS not being fully awake.   SUBJECTIVE:  Patient denies any pain, unable to provide clear history.   VITAL SIGNS: BP (!) 69/52   Pulse 70   Temp 97.7 F (36.5 C) (Rectal)   Resp 11    LMP 01/30/2005   SpO2 92%   HEMODYNAMICS:    VENTILATOR SETTINGS:    INTAKE / OUTPUT: No intake/output data recorded.  PHYSICAL EXAMINATION: General:  Ill appearing female, lying in bed, is able to saw simple words but does not have a coherent speech pattern, speech is hard to understand Neuro:  Awake, follows commands. HEENT:  [place]/AT, PERRLA. Scleral icterus Cardiovascular:  RRR, no m/r/g Lungs:  Coarse breathe sounds b/l, crackles on bases, limited to anterior fields Abdomen:  Distended, diffusely tender, has spider angiomata. Has some pannus erythema without any drainage.  Musculoskeletal:  Diffuse edematous on arms and legs.  Skin:  Very dry skin with cracking on both feet.   LABS:  BMET  Recent Labs Lab 12/11/16 0543 12/13/16 1029 12/20/2016 2121  NA 139 136 139  K 3.4* 3.3 3.0*  CL 106 101 100*  CO2 25 27 22   BUN 12 13 18   CREATININE 0.69 1.2 2.70*  GLUCOSE 109* 119* 114*    Electrolytes  Recent Labs Lab 12/11/16 0543 12/13/16 1029 12/23/2016 2121  CALCIUM 8.6* 9.0 9.0  MG 1.9  --   --     CBC  Recent Labs Lab 12/13/16 1029 01/13/2017 2121  WBC 9.8 10.2  HGB 11.3* 11.4*  HCT 33.9* 35.4*  PLT 219 200    Coag's  Recent Labs Lab 12/11/16 0543 12/28/2016  2121  APTT 43*  --   INR  --  1.61    Sepsis Markers  Recent Labs Lab 01/03/2017 2128 12/18/16 0049  LATICACIDVEN 5.48* 4.31*    ABG  Recent Labs Lab 12/30/2016 2056  PHART 7.404  PCO2ART 38.2  PO2ART 63.0*    Liver Enzymes  Recent Labs Lab 12/11/16 0543 12/13/16 1029 12/19/2016 2121  AST 251* 238* 200*  ALT 84* 90* 75*  ALKPHOS 157* 188* 184*  BILITOT 5.9* 7.00* 10.4*  ALBUMIN 2.0* 2.4* 2.1*    Cardiac Enzymes No results for input(s): TROPONINI, PROBNP in the last 168 hours.  Glucose No results for input(s): GLUCAP in the last 168 hours.  Imaging Dg Chest Port 1 View  Result Date: 01/12/2017 CLINICAL DATA:  Initial evaluation for acute hypoxia, increased leg  swelling. EXAM: PORTABLE CHEST 1 VIEW COMPARISON:  None available. FINDINGS: Examination is suboptimal due to shallow degree of lung inflation. Cardiac and mediastinal silhouettes grossly within normal limits. Lungs hypoinflated. Secondary bibasilar bronchovascular crowding with atelectasis. Perihilar vascular congestion suspected to in part be related to shallow degree of lung inflation. No overt pulmonary edema. No other focal infiltrates. No pneumothorax. No acute osseous abnormality. Surgical clips overlie the right axilla. IMPRESSION: 1. Shallow lung inflation with secondary bibasilar atelectasis and bronchovascular crowding. 2. No other definite active cardiopulmonary disease. Electronically Signed   By: Jeannine Boga M.D.   On: 12/20/2016 21:10     STUDIES:  CXR 4/2 >> Shallow lung inflation with secondary bibasilar atelectasis and bronchovascular crowding.  CULTURES: ucx 4/1 >> bcxx2 4/11 >>  ANTIBIOTICS: vanc zosyn  SIGNIFICANT EVENTS: Admitted for hypoxia, hypotension, AKI.   LINES/TUBES: PIV  DISCUSSION: Being admitted due to SOB likely from volume overload from CHF and likely hepato-pulmonary cause, remains hypotension likely from volume overload, has likely cardiorenal AKI. Speech is unclear but awake, could have some vocal cord dysfunction per chart.  ASSESSMENT / PLAN:  PULMONARY A: Mild hypoxia improved with [place] likely from volume overload P:   Cont suppl O2 to keep sat >90%  CARDIOVASCULAR A:  Hypotension - could be from cirrhosis + CHF, did not improve with IVF Acute on Chronic CHF P:  phenylephrine pressor support for now, keep MAP >65, once BP is stable will likely need diuretic to help with the volume overload and likely will improve perfusion once diuresed. Strict I/o, follow weights  RENAL A:   AKI likely cardiorenal or ATN from hypotension Hypokalemia  - replete and recheck P:   Monitor Scrt, maintain normotension, likely will improve with  diuresis Strict I/o.   GASTROINTESTINAL A:   Hepatic cirrhosis with ascites on exam, at risk of SBP P:   Will benefit from diagnostic paracentesis PPI lactulose  HEMATOLOGIC A:   Chronic anemia -stable plt ok, elevated PT likely from cirrhosis Advanced breast cancer - following with onc outpatient. P:  Monitor for bleeding, trend cbc.  INFECTIOUS A:   At risk of SBP  At risk of HCAP given recent prolonged ICU admission but nothing on chest xray Pannus fungal infection - less likely bacterial cellulitis - nystatin powder. P:   Cover broadly for HCAP and possible SBP with vanc and zosyn for now. f/up PCT and narrow abx within next 24-48 hours once patient becomes stable and we have more data. Nystatin powder Consider doing diagnostic paracentesis.  f/up on cultures.  ENDOCRINE A:   none   P:   Monitor CBG daily  NEUROLOGIC A:   Likely has hepatic encephalopathy  at baseline  Chronic DDD related pain P:   RASS goal: 0 Hold pain med for now with low BP Cont lactulose   FAMILY  - Updates: none available.   - Inter-disciplinary family meet or Palliative Care meeting due by:  12/25/16.     12/18/2016, 3:37 AM Dellia Nims, PGY-3 PCCM Resident   Attending Note:  I have examined patient, reviewed labs, studies and notes. I have discussed the case with Dr Genene Churn, and I agree with the data and plans as amended above.   66 year old debilitated woman with a history of metastatic breast cancer. She unfortunately has cirrhosis due to [facility] and metastatic disease to the liver. Complicated recent past medical history post Nissen fundoplication in February, compensated by massive upper GI bleed with ulcer at her surgical anastomosis site. She was hospitalized in early March for shock, respiratory failure, metabolic/hepatic encephalopathy. She had some component of diastolic CHF and a pannus cellulitis. She was discharged on 3/27 on furosemide 40 mg twice a day. She was readmitted  4/2 with decompensated respiratory failure, hypoxemia, total body volume overload based on lower extremity edema. She was initially hypertensive, received nitroglycerin. Since initial evaluation however she has been in shock. She remains hypotensive this morning after approximately 3 L. She had a lactic acidosis 5.48 that has improved 2.9 this morning. Likewise she has new acute renal failure with serum creatinine of 2.7. I have reviewed her chest x-ray which shows low volumes but no evidence of overt pulmonary edema. She has not been febrile. On my evaluation she wakes to voice, can answer questions appropriately but is sluggish. Oropharynx is clear, pupils are equal. Distant bilateral breath sounds with some very soft inspiratory crackles at both bases. Heart is regular, distant, tachycardic. She has 2+ lower extremity edema to the thigh. I suspect that at least a component of her hypotension is due to hypovolemia and overdiuresis. Certainly must also consider septic shock. Potential sources include SBP, possible pneumonia although her chest x-ray is reassuring. She has been started on broad-spectrum antibiotics. I think she is intravascularly dry despite being total body volume overloaded. I will give gentle fluids. I think she would likely benefit from a diagnostic paracentesis if there is enough fluid to sample. Also may benefit from central line placement to guide fluid administration. Continue lactulose. Follow hemoglobin given her recent GI bleeding. Continue Protonix twice a day. Given her overall debilitated state I believe we need to revisit goals of care and CODE STATUS.  Independent critical care time is 40 minutes.   Baltazar Apo, MD, PhD 12/18/2016, 9:13 AM [facility] [phone] or if no answer [phone]

## 2016-12-18 NOTE — ED Provider Notes (Signed)
Blood pressure (!) 76/52, pulse 71, temperature 97.7 F (36.5 C), temperature source Rectal, resp. rate 13, last menstrual period 01/30/2005, SpO2 94 %.  Assuming care from Dr. Adela Glimpse.  In short, Alexandria [name] is a 66 y.o. female with a chief complaint of Leg Swelling; Shortness of Breath; and Altered Mental Status .  Refer to the original H&P for additional details.  03:23 AM Spoke with ICU team. They will be down to admit with now lower BP. Will page Dr. Tamala Julian, hospitalist, to update on new plan.   Nanda Quinton, MD    Margette Fast, MD 12/18/16 [phone]

## 2016-12-19 DIAGNOSIS — I959 Hypotension, unspecified: Secondary | ICD-10-CM

## 2016-12-19 DIAGNOSIS — K7460 Unspecified cirrhosis of liver: Secondary | ICD-10-CM

## 2016-12-19 DIAGNOSIS — K767 Hepatorenal syndrome: Secondary | ICD-10-CM

## 2016-12-19 DIAGNOSIS — R600 Localized edema: Secondary | ICD-10-CM

## 2016-12-19 DIAGNOSIS — Z7189 Other specified counseling: Secondary | ICD-10-CM

## 2016-12-19 DIAGNOSIS — Z515 Encounter for palliative care: Secondary | ICD-10-CM

## 2016-12-19 DIAGNOSIS — C50919 Malignant neoplasm of unspecified site of unspecified female breast: Secondary | ICD-10-CM

## 2016-12-19 DIAGNOSIS — C787 Secondary malignant neoplasm of liver and intrahepatic bile duct: Secondary | ICD-10-CM

## 2016-12-19 LAB — URINE CULTURE: CULTURE: NO GROWTH

## 2016-12-19 MED ORDER — GLYCOPYRROLATE 1 MG PO TABS
1.0000 mg | ORAL_TABLET | ORAL | Status: DC | PRN
  Filled 2016-12-19: qty 1

## 2016-12-19 MED ORDER — PHENYLEPHRINE HCL 10 MG/ML IJ SOLN
25.0000 ug/min | INTRAMUSCULAR | Status: DC
Start: 2016-12-19 — End: 2016-12-20
  Administered 2016-12-19: 25 ug/min via INTRAVENOUS
  Filled 2016-12-19 (×2): qty 1

## 2016-12-19 MED ORDER — OXYCODONE HCL 20 MG/ML PO CONC
5.0000 mg | ORAL | Status: DC | PRN
Start: 2016-12-19 — End: 2016-12-20

## 2016-12-19 MED ORDER — GLYCOPYRROLATE 0.2 MG/ML IJ SOLN: 0.2000 mg | INTRAMUSCULAR | Status: DC | PRN

## 2016-12-19 MED ORDER — OXYCODONE HCL 20 MG/ML PO CONC: 5.0000 mg | ORAL | Status: DC | PRN

## 2016-12-19 MED ORDER — FENTANYL CITRATE (PF) 100 MCG/2ML IJ SOLN
25.0000 ug | INTRAMUSCULAR | Status: DC | PRN
  Administered 2016-12-19: 25 ug via INTRAVENOUS
  Administered 2016-12-20 (×4): 50 ug via INTRAVENOUS
  Filled 2016-12-19 (×5): qty 2

## 2016-12-19 MED ORDER — ACETAMINOPHEN 325 MG PO TABS: 650.0000 mg | ORAL_TABLET | Freq: Four times a day (QID) | ORAL | Status: DC | PRN

## 2016-12-19 MED ORDER — LORAZEPAM 2 MG/ML IJ SOLN
0.5000 mg | INTRAMUSCULAR | Status: DC | PRN
  Administered 2016-12-20: 0.5 mg via INTRAVENOUS
  Filled 2016-12-19: qty 1

## 2016-12-19 MED ORDER — LORAZEPAM 2 MG/ML PO CONC: 0.5000 mg | ORAL | Status: DC | PRN

## 2016-12-19 MED ORDER — ONDANSETRON 4 MG PO TBDP
4.0000 mg | ORAL_TABLET | Freq: Four times a day (QID) | ORAL | Status: DC | PRN
Start: 2016-12-19 — End: 2016-12-22

## 2016-12-19 MED ORDER — SODIUM CHLORIDE 0.9 % IV SOLN
30.0000 meq | Freq: Once | INTRAVENOUS | Status: AC
  Administered 2016-12-19: 30 meq via INTRAVENOUS
  Filled 2016-12-19: qty 15

## 2016-12-19 MED ORDER — ACETAMINOPHEN 650 MG RE SUPP: 650.0000 mg | Freq: Four times a day (QID) | RECTAL | Status: DC | PRN

## 2016-12-19 MED ORDER — POLYVINYL ALCOHOL 1.4 % OP SOLN
1.0000 [drp] | Freq: Four times a day (QID) | OPHTHALMIC | Status: DC | PRN
  Filled 2016-12-19: qty 15

## 2016-12-19 MED ORDER — ONDANSETRON HCL 4 MG/2ML IJ SOLN: 4.0000 mg | Freq: Four times a day (QID) | INTRAMUSCULAR | Status: DC | PRN

## 2016-12-19 MED ORDER — BIOTENE DRY MOUTH MT LIQD: 15.0000 mL | OROMUCOSAL | Status: DC | PRN

## 2016-12-19 NOTE — Progress Notes (Signed)
PULMONARY / CRITICAL CARE MEDICINE   Name: Alexandria [name] MRN: [id number] DOB: 06-15-1951    ADMISSION DATE:  01/05/2017 CONSULTATION DATE:  12/18/2016  REFERRING MD:  Dr. 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 Presents from SNF on 4/1 with lower ext swelling, hypoxia, and hypotension. Septic vs hypovolemic shock. Code discussion with family on 4/2 > partial code with palliative care consult.   Recent admission for Nissen fundoplication for hiatal hernia 2/16-2/24, then went to SNF from there but came in to ER 3/8 with massive UGI bleed from a large ulcer at the surgical anastomosis site causing hypovolemic shock/required intubation and prolonged ICU admission. Also had metabolic encephalopathy from ICU delirium and cirrhosis along with acute CHF, pannus cellulitis. Discharged on 3/27 to SNF with augmentin for cellulitis, lactulose for cirrhosis, and lasix 47m bid for CHF.   SUBJECTIVE:  On low dose Neo gtt.   VITAL SIGNS: BP (!) 78/49   Pulse (!) 105   Temp 97.8 F (36.6 C) (Oral)   Resp 17   Ht 5' 6"  (1.676 m)   Wt 115.8 kg (255 lb 4.7 oz)   LMP 01/30/2005   SpO2 98%   BMI 41.21 kg/m   HEMODYNAMICS:    VENTILATOR SETTINGS:    INTAKE / OUTPUT: I/O last 3 completed shifts: In: 4739.6 [P.O.:75; I.V.:1227.1; IV Piggyback:3437.5] Out: 2 [Other:1; Stool:1]  PHYSICAL EXAMINATION: General: Chronically ill adult female, no distress  Neuro:  Lethargic, follows commands, moves extremities  HEENT:  Normocephalic  Cardiovascular:  RRR, no MRG, NI S1/S2  Lungs:  Diminished breath sounds, crackles at bases, no wheezes, non-labored  Abdomen:  Distended, tender, active bowel sounds  Musculoskeletal:  +3 edema to BLE/BUE Skin:  Warm, dry, intact   LABS:  BMET  Recent Labs Lab 12/13/16 1029 01/01/2017 2121 12/18/16 0521  NA 136 139 142  K  3.3 3.0* 3.2*  CL 101 100* 105  CO2 27 22 24   BUN 13 18 18   CREATININE 1.2 2.70* 2.65*  GLUCOSE 119* 114* 100*    Electrolytes  Recent Labs Lab 12/13/16 1029 12/29/2016 2121 12/18/16 0521  CALCIUM 9.0 9.0 8.7*    CBC  Recent Labs Lab 12/13/16 1029 01/06/2017 2121 12/18/16 0521  WBC 9.8 10.2 14.4*  HGB 11.3* 11.4* 10.7*  HCT 33.9* 35.4* 33.3*  PLT 219 200 156    Coag's  Recent Labs Lab 01/01/2017 2121  INR 1.61    Sepsis Markers  Recent Labs Lab 01/01/2017 2128 12/18/16 0049 12/18/16 0521 12/18/16 0801  LATICACIDVEN 5.48* 4.31*  --  2.9*  PROCALCITON  --   --  1.39  --     ABG  Recent Labs Lab 12/29/2016 2056  PHART 7.404  PCO2ART 38.2  PO2ART 63.0*    Liver Enzymes  Recent Labs Lab 12/13/16 1029 01/06/2017 2121  AST 238* 200*  ALT 90* 75*  ALKPHOS 188* 184*  BILITOT 7.00* 10.4*  ALBUMIN 2.4* 2.1*    Cardiac Enzymes No results for input(s): TROPONINI, PROBNP in the last 168 hours.  Glucose No results for input(s): GLUCAP in the last 168 hours.  Imaging No results found.   STUDIES:  CXR 4/2 > Shallow lung inflation with secondary bibasilar atelectasis and bronchovascular crowding.  CULTURES: Urine 4/1 >> Blood 4/1 >>  ANTIBIOTICS: vanc 4/1 >> zosyn 4/1 >>   SIGNIFICANT EVENTS: Admitted for  hypoxia, hypotension, AKI.   LINES/TUBES: PIV   DISCUSSION: 66 year old female with extensive health history. From SNF with dyspnea/hypotension and volume overload secondary to CHF. Goals of care discussion with family 4/2, partial code. Palliative care consulted   ASSESSMENT / PLAN:  PULMONARY A: Acute hypoxic respiratory failure secondary to volume overload vs PNA  P:   Maintain Oxygen >92 Pulmonary Hygiene  PRN albuterol  Trend CXR  CARDIOVASCULAR A:  Acute on Chronic Diastolic Heart Failure  Hypotension  P:  Cardiac Monitoring Maintain MAP >65 Wean Neo gtt to achieve MAP goal   RENAL A:   Acute Kidney Injury   Lactic Acidosis - down-trending  Hypokalemia  P:   Trend BMP Replace electrolytes as needed   GASTROINTESTINAL A:   H/O Hepatic Cirrhosis with ascites  P:   NPO PPI Continue Lactulose   HEMATOLOGIC A:   Chronic Anemia Met. Breast Cancer  P:  Trend CBC Maintain Hemoglobin >7  INFECTIOUS A:   Septic vs Cardiogenic Shock  -Lactic Acid downtrending, procal 1.39, WBC 14.4, CXR neg PNA  P:   Trend WBC and fever curve  Follow culture data  Continue Vanc and Zosyn  Follow PCT   ENDOCRINE A:   No issues    P:   Trend Glucose   NEUROLOGIC A:   Hepatic Encephalopathy  Chronic DDD related to pain  P:   RASS goal: 0 Monitor  Palliative care consult pending  Continue Lexapro   FAMILY  - Updates: no family at bedside   - Inter-disciplinary family meet or Palliative Care meeting due by:  Ongoing   Dr. Lamonte Sakai spoke with family on 4/2, okay with pressors and BIPAP if needed but no CVC placement, no intubation, no CPR  CC Time: 35 minutes     Hayden Pedro, AG-ACNP [facility]  Pgr: [phone]  PCCM Pgr: [phone]  Attending Note:  I have examined patient, reviewed labs, studies and notes. I have discussed the case with Beaulah Corin, and I agree with the data and plans as amended above. 66 year old woman with history of metastatic breast cancer, cirrhosis and hepatic failure due to [facility] and liver metastases. She has had a progressive decline over the last month since she underwent Nissen fundoplication complicated by massive upper GI bleeding and a pannicular cellulitis, shock, respiratory failure, encephalopathy. She has never really bounced back from that significant hospitalization. She was ultimately discharged to rehabilitation on 3/27 but returned with decompensated respiratory failure and evolving renal failure, shock. She was treated with pressors, volume, empiric antibiotics. On my evaluation today she is lethargic but does waken  interact. She remains on low-dose phenylephrine. Her lungs are distant but without wheezing. Heart tachycardic, regular. She is globally weak. There were unable to get her labs today. I had an opportunity to speak with her husband Alexandria [name] and her son Alexandria [name] in person this morning. Per our discussions yesterday they are very focused on her comfort and her dignity. They note her overall progressive decline and low likelihood of an overall meaningful recovery. They have requested a palliative Care consultation and have met with PC this am. They are hoping to transition off pressors today, move to floor bed for comfort measures soon after. We will facilitate this.  Independent critical care time is 35 minutes.   Baltazar Apo, MD, PhD 12/19/2016, 12:24 PM [facility] [phone] or if no answer [phone]

## 2016-12-19 NOTE — Consult Note (Signed)
Consultation Note Date: 12/19/2016   Patient Name: Alexandria [name]  DOB: [birth year]  MRN: [id number]  Age / Sex: 66 y.o., female  PCP: Marletta Lor, MD Referring Physician: Juanito Doom, MD  Reason for Consultation: Establishing goals of care and Hospice Evaluation d/t metastatic breast cancer to liver with underlying cirrhosis. Multiple hospitalizations with continued decline.   HPI/Patient Profile: 66 y.o. female  with past medical history of breast cancer s/p chemo/radiation 2014 with recurrent metastatic breast cancer liver mets discovered incidentally when hospitalized 11/03/16-11/11/16 for hiatal hernia repair with Nissen fundoplication. Following this surgery she came to ED 11/23/16 with severe GIB from ulceration at surgical anastomosis and required prolonged ICU stay with intubation and shock followed by delirium, cirrhosis, acute CHF, and pannus cellulitis. Has continued to decline since past hospitalizations. Admitted on 12/29/2016 with BLE and found to be hypoxic and hypotensive.   Clinical Assessment and Goals of Care: I met today with husband Peterson Ao, son Legrand Como, daughter-in-law Opal Sidles. Family is very aware of her acute decline and they have been considering hospice care at home. They are very clear that they are hopeful for comfort and that she does not suffer. We discussed the details of what this means and will look like. They mention getting her home with hospice but understand that may not be possible.   They will call family to allow for visits if desired and proceed with titrating off vasopressors and transition to 6N for comfort care at EOL. They understand that prognosis if very poor and she could live hours or days likely. Many questions/concerns addressed. Emotional support provided.   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   Primary Decision Maker NEXT OF KIN husband Peterson Ao    SUMMARY OF RECOMMENDATIONS   - Will proceed with comfort focused care - Titrate off vasopressors when family ready - When vasopressors stopped transition to 6N - Family agreed not to proceed with further IV access if loses IV as she is a difficult stick. Comfort should be manageable with SL medications.   Code Status/Advance Care Planning:  DNR   Symptom Management:   Pain/dyspnea: Fentanyl 25-50 mcg every hour prn. Oxycodone elixir 5 mg eery 2 hours prn if loses IV access.   Anxiety: Lorazepam 0.5 mg IV/po every 4 hours prn.   Secretions: Robinul prn.   Palliative Prophylaxis:   Aspiration, Delirium Protocol, Frequent Pain Assessment, Oral Care and Turn Reposition  Additional Recommendations (Limitations, Scope, Preferences):  Full Comfort Care  Psycho-social/Spiritual:   Desire for further Chaplaincy support: yes  Additional Recommendations: Caregiving  Support/Resources, Education on Hospice and Grief/Bereavement Support  Prognosis:   Hours - Days  Discharge Planning: To Be Determined      Primary Diagnoses: Present on Admission: . (Resolved) Acute respiratory failure ([place]) . Acute respiratory failure with hypoxia ([place]) . AKI (acute kidney injury) ([place]) . Breast cancer, right breast ([place]) .  Anxiety state . Chronic back pain . Coronary artery calcification . Hypokalemia . Iron deficiency anemia . Metastatic breast cancer ([place]) . Vocal cord dysfunction   I have reviewed the medical record, interviewed the patient and family, and examined the patient. The following aspects are pertinent.  Past Medical History:  Diagnosis Date  . Anxiety   . Breast cancer ([place]) 10/02/12   Invasive Ductal Carcinoma of the Right Upper Outer Quadrant - ER (>90%), PR - Neg., Her2 Neu Negative, Ki-67 Unknown  . Chronic back pain    "all the way up and down"  . Chronic bronchitis  ([place])    "get it q yr" (09/07/2014)  . Complication of anesthesia    "I come out really anxious; need Ativan to ease me out" (09/07/2014)  . Daily headache    "recently" (09/07/2014)  . DDD (degenerative disc disease), cervical   . DDD (degenerative disc disease), lumbosacral   . DDD (degenerative disc disease), thoracolumbar   . Diastolic dysfunction   . Diastolic heart failure ([place])   . Fibromyalgia   . GAD (generalized anxiety disorder)   . GERD (gastroesophageal reflux disease)   . Goals of care, counseling/discussion 12/14/2016  . H/O hiatal hernia   . Heart murmur   . History of blood transfusion "3-4"   "never can tell why; don't know where the blood was coming from; doesn't show up in stool or urine"  . Hx of pulmonary embolus    During c-Section  . Hx of radiation therapy 03/04/13- 04/17/13   right breast 50 Gy 25 fractions, right breast boost 10 Gy 5 fractions  . Hyperlipidemia   . Hypertension   . Iron deficiency anemia   . Lymphedema    bilateral lower extremity  . Obesity    Class 2  . Peripheral neuropathic pain   . Pneumonia    "at least twice/yr" (09/07/2014)  . PONV (postoperative nausea and vomiting)   . Recurrent major depression ([place]) 06/2014   Seen by Dr. Louretta Shorten  . S/P radiation therapy  03/04/2013-04/17/2013   1) Right breast / 50 Gy in 25 fractions/ 2) Right breast boost / 10 Gy in 5 fractions  . Status post chemotherapy    4 cycles of Taxotere and cytoxan   Social History   Social History  . Marital status: Married    Spouse name: N/A  . Number of children: N/A  . Years of education: N/A   Occupational History  . retired Therapist, sports    Social History Main Topics  . Smoking status: Never Smoker  . Smokeless tobacco: Never Used     Comment: Through father  . Alcohol use No  . Drug use: No  . Sexual activity: Not Currently   Other Topics Concern  . None   Social History Narrative   Originally from [place], [place]. Previously lived in [place].  Dorie Rank to [facility] in 2014. Previously worked as an Warden/ranger. No known TB exposure. No pets currently. No bird exposure. Minimal mold exposure.    Family History  Problem Relation Age of Onset  . Parkinson's disease Mother   . Emphysema Maternal Grandfather   . COPD Maternal Grandfather   . COPD Sister   . Heart attack Sister   . COPD Maternal Grandmother   . CAD Neg Hx    Scheduled Meds: . escitalopram  10 mg Oral BID  . famotidine  10 mg Oral Daily  . heparin  5,000 Units Subcutaneous Q8H  . lactulose  30  g Oral TID  . lidocaine (PF)  5 mL Intradermal Once  . mouth rinse  15 mL Mouth Rinse BID  . montelukast  5 mg Oral QHS  . multivitamin with minerals  1 tablet Oral Daily  . nystatin cream   Topical BID  . pantoprazole  40 mg Oral BID  . piperacillin-tazobactam (ZOSYN)  IV  3.375 g Intravenous Q8H  . vancomycin  1,500 mg Intravenous Q48H   Continuous Infusions: . phenylephrine (NEO-SYNEPHRINE) Adult infusion 25 mcg/min (12/19/16 0823)   PRN Meds:.sodium chloride, albuterol, fentaNYL (SUBLIMAZE) injection Allergies  Allergen Reactions  . Contrast Media [Iodinated Diagnostic Agents] Anaphylaxis  . Solu-Medrol [Methylprednisolone Acetate] Other (See Comments)    Pt. States she is not allergic, just can't tolerate medication well.   . Albuterol Other (See Comments)    Anxious   . Prednisone Other (See Comments)    Pt gets very agitated when she takes high doses of steroids   Review of Systems  Unable to perform ROS: Acuity of condition    Physical Exam  Constitutional: She appears well-developed. She appears lethargic. She is not intubated.  HENT:  Head: Normocephalic and atraumatic.  Cardiovascular: Normal rate.   Pulmonary/Chest: Effort normal. No accessory muscle usage. No apnea, no tachypnea and no bradypnea. She is not intubated. No respiratory distress. She has decreased breath sounds in the right lower field and the left lower field.  Abdominal: She exhibits  distension. There is generalized tenderness.  Neurological: She appears lethargic. She is disoriented.  Nursing note and vitals reviewed.   Vital Signs: BP (!) 78/49   Pulse (!) 105   Temp 97.8 F (36.6 C) (Oral)   Resp 17   Ht 5' 6"  (1.676 m)   Wt 115.8 kg (255 lb 4.7 oz)   LMP 01/30/2005   SpO2 98%   BMI 41.21 kg/m  Pain Assessment: No/denies pain   Pain Score: 0-No pain   SpO2: SpO2: 98 % O2 Device:SpO2: 98 % O2 Flow Rate: .O2 Flow Rate (L/min): 6 L/min  IO: Intake/output summary:  Intake/Output Summary (Last 24 hours) at 12/19/16 1003 Last data filed at 12/19/16 0800  Gross per 24 hour  Intake          1723.63 ml  Output                0 ml  Net          1723.63 ml    LBM: Last BM Date: 12/18/16 Baseline Weight: Weight: 112.6 kg (248 lb 3.8 oz) Most recent weight: Weight: 115.8 kg (255 lb 4.7 oz)     Palliative Assessment/Data: PPS: 20%      Time Total: 66mn  Greater than 50%  of this time was spent counseling and coordinating care related to the above assessment and plan.  Signed by: AVinie Sill NP Palliative Medicine Team Pager # [phone](M-F 8a-5p) Team Phone # [phone](Nights/Weekends)

## 2016-12-19 NOTE — Progress Notes (Signed)
eLink Physician-Brief Progress Note Patient Name: Alexandria [name] DOB: Aug 11, 1951 MRN: [id number]   Date of Service  12/19/2016  HPI/Events of Note  Patient comfort care. Request from bedside nurse to D/C Lactulose and put the Phenylephrine IV infusion at a fixed dose of 25 mcg/min.   eICU Interventions  Will order: 1. D/C Lactulose. 2. Phenylephrine IV infusion to run IV at 25 mcg/min.      Intervention Category Minor Interventions: Routine modifications to care plan (e.g. PRN medications for pain, fever)  Malone Vanblarcom [place] 12/19/2016, 8:30 PM

## 2016-12-19 NOTE — Progress Notes (Signed)
Patient's family member requested priest to come and speak with them -- RN called chaplain. Chaplain will come and speak with family and will consult priest.

## 2016-12-19 NOTE — Consult Note (Signed)
Referral MD  Reason for Referral: Metastatic breast cancer, cirrhosis admitted to [facility]; hypotension   Chief Complaint  Patient presents with  . Leg Swelling  . Shortness of Breath  . Altered Mental Status  : Patient would not give much history.  HPI: Ms. Alexandria [name] is well-known to me. She is a 66 year old postmenopausal female. She had incidentally found metastatic breast cancer into the liver. This was found at the time of surgery for hiatal hernia repair. She had GI bleeding after she had got home. She was hospitalized for several weeks trying to recover.  Her breast cancer is ER positive. We started her on Faslodex. She is had 2 doses. Her last dose was given in the office a week ago. We're trying to get her on oral therapy in addition to Faslodex but this is quite expensive.  She was readmitted over the Easter weekend. She had hypotension. She had marked edema in her legs.  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 Her bilirubin was saw her in the office was 7. Her ammonia has been on the higher side. When she was admitted, her bilirubin was 10.  She also has renal insufficiency. Her creatinine was 2.7 on admission. We [place] as her creatinine is 1.2.  Cultures have been negative to date.  She's not eating much. She currently is on pressor support.  She has some pain. It is hard to sell exactly where the pain is located.  She's had no leading. Her hemoglobin is 10.7. Her platelet count is 156,000.  She's not had any obvious vomiting.  There's been no obvious fever.     Past Medical History:  Diagnosis Date  . Anxiety   . Breast cancer ([place]) 10/02/12   Invasive Ductal Carcinoma of the Right Upper Outer Quadrant - ER (>90%), PR - Neg., Her2 Neu Negative, Ki-67 Unknown  . Chronic back pain    "all the way up and down"  . Chronic bronchitis ([place])    "get it q yr" (09/07/2014)  . Complication of anesthesia    "I come out really anxious; need Ativan to ease me out"  (09/07/2014)  . Daily headache    "recently" (09/07/2014)  . DDD (degenerative disc disease), cervical   . DDD (degenerative disc disease), lumbosacral   . DDD (degenerative disc disease), thoracolumbar   . Diastolic dysfunction   . Diastolic heart failure ([place])   . Fibromyalgia   . GAD (generalized anxiety disorder)   . GERD (gastroesophageal reflux disease)   . Goals of care, counseling/discussion 12/14/2016  . H/O hiatal hernia   . Heart murmur   . History of blood transfusion "3-4"   "never can tell why; don't know where the blood was coming from; doesn't show up in stool or urine"  . Hx of pulmonary embolus    During c-Section  . Hx of radiation therapy 03/04/13- 04/17/13   right breast 50 Gy 25 fractions, right breast boost 10 Gy 5 fractions  . Hyperlipidemia   . Hypertension   . Iron deficiency anemia   . Lymphedema    bilateral lower extremity  . Obesity    Class 2  . Peripheral neuropathic pain   . Pneumonia    "at least twice/yr" (09/07/2014)  . PONV (postoperative nausea and vomiting)   . Recurrent major depression ([place]) 06/2014   Seen by Dr. Louretta Shorten  . S/P radiation therapy  03/04/2013-04/17/2013   1) Right breast / 50 Gy in 25 fractions/ 2) Right breast boost / 10  Gy in 5 fractions  . Status post chemotherapy    4 cycles of Taxotere and cytoxan  :  Past Surgical History:  Procedure Laterality Date  . ABDOMINAL HYSTERECTOMY    . APPENDECTOMY    . BREAST BIOPSY Right   . BREAST LUMPECTOMY Right   . CESAREAN SECTION     [phone]  . DILATION AND CURETTAGE OF UTERUS  "numerous"  . ESOPHAGOGASTRODUODENOSCOPY N/A 11/23/2016   Procedure: ESOPHAGOGASTRODUODENOSCOPY (EGD);  Surgeon: Manus Gunning, MD;  Location: [facility];  Service: Gastroenterology;  Laterality: N/A;  . ESOPHAGOGASTRODUODENOSCOPY N/A 11/25/2016   Procedure: ESOPHAGOGASTRODUODENOSCOPY (EGD);  Surgeon: Manus Gunning, MD;  Location: [facility];  Service: Gastroenterology;   Laterality: N/A;  . FOOT SURGERY Right ~ 2013 X 3   "put pins in but pins kept breaking"  . HIATAL HERNIA REPAIR N/A 11/03/2016   Procedure: LAPAROSCOPIC NISSEN AND  REPAIR OF HIATAL HERNIA,;  Surgeon: Johnathan Hausen, MD;  Location: [facility];  Service: General;  Laterality: N/A;  . INGUINAL HERNIA REPAIR Bilateral    "cancer"  . PARTIAL COLECTOMY N/A 07/04/2014   Procedure: REPAIR OF INCARCERATED INCISIONAL HERNIA WITH MESH;  Surgeon: Excell Seltzer, MD;  Location: [facility];  Service: General;  Laterality: N/A;  . TONSILLECTOMY    . UMBILICAL HERNIA REPAIR  X 2  :   Current Facility-Administered Medications:  .  0.9 %  sodium chloride infusion, 250 mL, Intravenous, PRN, Tasrif Ahmed, MD, Last Rate: 10 mL/hr at 12/18/16 0700, 250 mL at 12/18/16 0700 .  albuterol (PROVENTIL) (2.5 MG/3ML) 0.083% nebulizer solution 3 mL, 3 mL, Inhalation, Q6H PRN, Tasrif Ahmed, MD .  escitalopram (LEXAPRO) tablet 10 mg, 10 mg, Oral, BID, Tasrif Ahmed, MD, 10 mg at 12/18/16 2326 .  famotidine (PEPCID) tablet 10 mg, 10 mg, Oral, Daily, Tasrif Ahmed, MD, 10 mg at 12/18/16 1019 .  fentaNYL (SUBLIMAZE) injection 25-50 mcg, 25-50 mcg, Intravenous, Q2H PRN, Anders Simmonds, MD, 25 mcg at 12/18/16 1856 .  heparin injection 5,000 Units, 5,000 Units, Subcutaneous, Q8H, Tasrif Ahmed, MD, 5,000 Units at 12/18/16 2309 .  lactulose (CHRONULAC) 10 GM/15ML solution 30 g, 30 g, Oral, TID, Tasrif Ahmed, MD, 30 g at 12/18/16 2326 .  lidocaine (PF) (XYLOCAINE) 1 % injection 5 mL, 5 mL, Intradermal, Once, Jenifer Algernon Huxley, MD .  MEDLINE mouth rinse, 15 mL, Mouth Rinse, BID, Collene Gobble, MD, 15 mL at 12/18/16 2259 .  montelukast (SINGULAIR) chewable tablet 5 mg, 5 mg, Oral, QHS, Tasrif Ahmed, MD, 5 mg at 12/18/16 2314 .  multivitamin with minerals tablet 1 tablet, 1 tablet, Oral, Daily, Tasrif Ahmed, MD, 1 tablet at 12/18/16 1019 .  nystatin cream (MYCOSTATIN), , Topical, BID, Tasrif Ahmed, MD .  pantoprazole (PROTONIX) EC  tablet 40 mg, 40 mg, Oral, BID, Tasrif Ahmed, MD, 40 mg at 12/18/16 2309 .  phenylephrine (NEO-SYNEPHRINE) 10 mg in sodium chloride 0.9 % 250 mL (0.04 mg/mL) infusion, 0-400 mcg/min, Intravenous, Titrated, Tasrif Ahmed, MD, Last Rate: 37.5 mL/hr at 12/18/16 1900, 25 mcg/min at 12/18/16 1900 .  piperacillin-tazobactam (ZOSYN) IVPB 3.375 g, 3.375 g, Intravenous, Q8H, Gareth Morgan, MD, 3.375 g at 12/18/16 2310 .  vancomycin (VANCOCIN) 1,500 mg in sodium chloride 0.9 % 500 mL IVPB, 1,500 mg, Intravenous, Q48H, Gareth Morgan, MD:  . escitalopram  10 mg Oral BID  . famotidine  10 mg Oral Daily  . heparin  5,000 Units Subcutaneous Q8H  . lactulose  30 g Oral TID  . lidocaine (PF)  5 mL Intradermal Once  . mouth rinse  15 mL Mouth Rinse BID  . montelukast  5 mg Oral QHS  . multivitamin with minerals  1 tablet Oral Daily  . nystatin cream   Topical BID  . pantoprazole  40 mg Oral BID  . piperacillin-tazobactam (ZOSYN)  IV  3.375 g Intravenous Q8H  . vancomycin  1,500 mg Intravenous Q48H  :  Allergies  Allergen Reactions  . Contrast Media [Iodinated Diagnostic Agents] Anaphylaxis  . Solu-Medrol [Methylprednisolone Acetate] Other (See Comments)    Pt. States she is not allergic, just can't tolerate medication well.   . Albuterol Other (See Comments)    Anxious   . Prednisone Other (See Comments)    Pt gets very agitated when she takes high doses of steroids  :  Family History  Problem Relation Age of Onset  . Parkinson's disease Mother   . Emphysema Maternal Grandfather   . COPD Maternal Grandfather   . COPD Sister   . Heart attack Sister   . COPD Maternal Grandmother   . CAD Neg Hx   :  Social History   Social History  . Marital status: Married    Spouse name: N/A  . Number of children: N/A  . Years of education: N/A   Occupational History  . retired Therapist, sports    Social History Main Topics  . Smoking status: Never Smoker  . Smokeless tobacco: Never Used     Comment: Through  father  . Alcohol use No  . Drug use: No  . Sexual activity: Not Currently   Other Topics Concern  . Not on file   Social History Narrative   Originally from [place], [place]. Previously lived in [place]. Dorie Rank to [facility] in 2014. Previously worked as an Warden/ranger. No known TB exposure. No pets currently. No bird exposure. Minimal mold exposure.   :  Pertinent items are noted in HPI.  Exam: Patient Vitals for the past 24 hrs:  BP Temp Temp src Pulse Resp SpO2 Weight  12/19/16 0416 - - - - - - 255 lb 4.7 oz (115.8 kg)  12/19/16 0400 (!) 102/45 97.8 F (36.6 C) Oral - - - -  12/19/16 0300 (!) 90/54 - - (!) 107 14 100 % -  12/19/16 0200 (!) 76/54 - - (!) 111 16 100 % -  12/19/16 0100 (!) 86/51 - - (!) 110 11 99 % -  12/19/16 0000 (!) 83/34 98.2 F (36.8 C) Oral (!) 112 (!) 8 94 % -  12/18/16 2300 (!) 81/58 - - (!) 114 14 95 % -  12/18/16 2200 93/68 - - - 16 95 % -  12/18/16 2100 (!) 89/55 - - (!) 115 (!) 8 94 % -  12/18/16 2000 (!) 86/63 98 F (36.7 C) Oral (!) 115 10 93 % -  12/18/16 1800 91/79 - - 99 10 94 % -  12/18/16 1700 (!) 89/67 - - 75 12 94 % -  12/18/16 1600 93/60 - - 73 10 93 % -  12/18/16 1559 - 97.6 F (36.4 C) Axillary - - - -  12/18/16 1500 (!) 91/50 - - 68 11 95 % -  12/18/16 1400 (!) 85/59 - - 71 13 90 % -  12/18/16 1300 (!) 80/57 - - 66 17 94 % -  12/18/16 1200 - - - 69 14 95 % -  12/18/16 1141 - 97.4 F (36.3 C) Axillary - - - -  12/18/16 1100 (!) 91/57 - -  74 15 94 % -  12/18/16 1030 90/68 - - 73 14 92 % -  12/18/16 1015 97/67 - - 83 13 94 % -  12/18/16 1000 (!) 84/53 - - 84 15 90 % -  12/18/16 0945 92/63 - - 69 17 93 % -  12/18/16 0930 - - - 71 14 92 % -  12/18/16 0915 (!) 89/63 - - 70 16 92 % -  12/18/16 0900 (!) 72/53 - - 69 13 94 % -  12/18/16 0800 102/71 - - 72 14 90 % -  12/18/16 0740 - 97.5 F (36.4 C) Axillary - - - -  12/18/16 0700 92/66 - - 70 13 93 % -    As above    Recent Labs  12/27/2016 2121 12/18/16 0521  WBC 10.2 14.4*  HGB 11.4* 10.7*   HCT 35.4* 33.3*  PLT 200 156    Recent Labs  12/23/2016 2121 12/18/16 0521  NA 139 142  K 3.0* 3.2*  CL 100* 105  CO2 22 24  GLUCOSE 114* 100*  BUN 18 18  CREATININE 2.70* 2.65*  CALCIUM 9.0 8.7*    Blood smear review:  None  Pathology: None     Assessment and Plan:  Ms. Stander is a 66 year old postmenopausal female with metastatic breast cancer. Her disease is confined to the liver.  She has underlying cirrhosis. She has NASH.  We will see what her CA 27.29 is. Again she's had 2 doses of Faslodex. Hopefully this has provided a response. I'm not sure if he'll ever be oh to get her the Verzenio, which is the oral agent that we use with Faslodex.  Her cirrhosis might be the deciding factor with respect to her prognosis. If her cirrhosis worsens and her bilirubin continues to increase, then I do not see any need for treating her breast cancer.  She maybe developing hepatorenal disease given her elevated creatinine.  She has a little bit of confusion. We were able to have a nice prayer session. This made her feel better.  I did talk to her cell phone yesterday. I told him what I thought was going on. We talked about end-of-life issues. I suspect that she probably will need hospice. I just don't see that she'll be able to be cared for at home. She may need outpatient hospice.  I really think that we will know what "path" she is going down in the next couple days. She continues on pressor support which will have a negative impact on her kidneys. We will see what her CA 27.29 is.  I appreciate everybody's help in trying to get her better. I'm just not sure how much can really be done for her outside of what is already being done.  [name], MD  Oswaldo Milian 41:10

## 2016-12-20 ENCOUNTER — Other Ambulatory Visit: Payer: Medicare Other

## 2016-12-20 ENCOUNTER — Ambulatory Visit: Payer: Medicare Other | Admitting: Family

## 2016-12-20 MED ORDER — FENTANYL CITRATE (PF) 100 MCG/2ML IJ SOLN: 100.0000 ug | INTRAMUSCULAR | Status: DC | PRN

## 2016-12-20 MED ORDER — SODIUM CHLORIDE 0.9 % IV SOLN
0.5000 mg/h | INTRAVENOUS | Status: DC
  Administered 2016-12-20 – 2016-12-22 (×3): 0.5 mg/h via INTRAVENOUS
  Filled 2016-12-20 (×3): qty 2.5

## 2016-12-20 MED ORDER — HYDROMORPHONE HCL 1 MG/ML IJ SOLN
1.0000 mg | INTRAMUSCULAR | Status: DC | PRN
  Administered 2016-12-20: 1 mg via INTRAVENOUS
  Filled 2016-12-20: qty 1

## 2016-12-20 MED ORDER — OXYCODONE HCL 20 MG/ML PO CONC: 15.0000 mg | ORAL | Status: DC | PRN

## 2016-12-20 MED ORDER — OXYCODONE HCL 20 MG/ML PO CONC: 10.0000 mg | ORAL | Status: DC | PRN

## 2016-12-20 MED ORDER — LORAZEPAM 2 MG/ML IJ SOLN
1.0000 mg | INTRAMUSCULAR | Status: DC | PRN
  Administered 2016-12-20: 1 mg via INTRAVENOUS
  Filled 2016-12-20: qty 1

## 2016-12-20 MED ORDER — HYDROMORPHONE BOLUS VIA INFUSION
0.5000 mg | INTRAVENOUS | Status: DC | PRN
Start: 2016-12-20 — End: 2016-12-22
  Administered 2016-12-20 (×6): 1 mg via INTRAVENOUS
  Filled 2016-12-20: qty 1

## 2016-12-20 MED ORDER — LORAZEPAM 2 MG/ML PO CONC: 1.0000 mg | ORAL | Status: DC | PRN

## 2016-12-20 NOTE — Progress Notes (Signed)
Alexandria [name] is comfortable. She now is basically comfort care. Her liver, I am sure, is failing. No labs are back yet from today.  I am thankful that she is getting such wonderful care that is so compassionate.  She's having no nausea or vomiting. She is not eating. She's having no bleeding.  She is off pressor support. Her blood pressure is being maintained but still on the low side.  She does seem to be fairly coherent. She and I, as always, had a very good prayer session. She is not afraid to pass on as she knows where she is going.   On her physical exam, her blood pressure is 92/47. She is afebrile. Her pulse is 71. Her lungs sound relatively clear. Maybe some slight decrease in the bases. Cardiac exam regular rate and rhythm with occasional extra beat. Abdomen is obese but soft. There may be some slight tenderness in the epigastric region. Bowel sounds are somewhat decreased. Extremities shows marked edema bilaterally.  Alexandria [name], even though she does have metastatic breast cancer, has underlying hepatic cirrhosis from NASH that I think is going to be the event that causes her to pass on to [name]. It would be nice to check her labs to see how her liver function is.  Again, she is comfortable. She does seem to be" peace".  Alexandria Haw, MD  Psalm [phone]

## 2016-12-20 NOTE — Progress Notes (Signed)
PULMONARY / CRITICAL CARE MEDICINE   Name: Alexandria [name] MRN: [id number] DOB: 08-07-1951    ADMISSION DATE:  12/25/2016 CONSULTATION DATE:  12/18/2016  REFERRING MD:  Dr. 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 Presents from SNF on 4/1 with lower ext swelling, hypoxia, and hypotension. Septic vs hypovolemic shock. Code discussion with family on 4/2 > partial code with palliative care consult.   Recent admission for Nissen fundoplication for hiatal hernia 2/16-2/24, then went to SNF from there but came in to ER 3/8 with massive UGI bleed from a large ulcer at the surgical anastomosis site causing hypovolemic shock/required intubation and prolonged ICU admission. Also had metabolic encephalopathy from ICU delirium and cirrhosis along with acute CHF, pannus cellulitis. Discharged on 3/27 to SNF with augmentin for cellulitis, lactulose for cirrhosis, and lasix '40mg'$  bid for CHF.   SUBJECTIVE:  Patient somnolent, off pressors, comfortable Family at bedside   VITAL SIGNS: BP (!) 92/47   Pulse 72   Temp 98.3 F (36.8 C) (Oral)   Resp (!) 8   Ht '5\' 6"'$  (1.676 m)   Wt 115.8 kg (255 lb 4.7 oz)   LMP 01/30/2005   SpO2 93%   BMI 41.21 kg/m   HEMODYNAMICS:    VENTILATOR SETTINGS:    INTAKE / OUTPUT: I/O last 3 completed shifts: In: 1636.3 [P.O.:180; I.V.:1226.3; IV Piggyback:230] Out: -   PHYSICAL EXAMINATION: General: chronically ill, over wt woman, comfortable Neuro:  obtunded HEENT:  No oral lesions Cardiovascular:  RRR, no MRG, NI S1/S2  Lungs:  Shallow breaths, no increased WOB Abdomen:  Soft, benign Musculoskeletal:  2- 3+ edema Skin:  No rash  LABS:  BMET  Recent Labs Lab 01/04/2017 2121 12/18/16 0521  NA 139 142  K 3.0* 3.2*  CL 100* 105  CO2 22 24  BUN 18 18  CREATININE 2.70* 2.65*  GLUCOSE 114* 100*     Electrolytes  Recent Labs Lab 12/23/2016 2121 12/18/16 0521  CALCIUM 9.0 8.7*    CBC  Recent Labs Lab 12/24/2016 2121 12/18/16 0521  WBC 10.2 14.4*  HGB 11.4* 10.7*  HCT 35.4* 33.3*  PLT 200 156    Coag's  Recent Labs Lab 01/02/2017 2121  INR 1.61    Sepsis Markers  Recent Labs Lab 01/01/2017 2128 12/18/16 0049 12/18/16 0521 12/18/16 0801  LATICACIDVEN 5.48* 4.31*  --  2.9*  PROCALCITON  --   --  1.39  --     ABG  Recent Labs Lab 01/01/2017 2056  PHART 7.404  PCO2ART 38.2  PO2ART 63.0*    Liver Enzymes  Recent Labs Lab 01/10/2017 2121  AST 200*  ALT 75*  ALKPHOS 184*  BILITOT 10.4*  ALBUMIN 2.1*    Cardiac Enzymes No results for input(s): TROPONINI, PROBNP in the last 168 hours.  Glucose No results for input(s): GLUCAP in the last 168 hours.  Imaging No results found.   STUDIES:  CXR 4/2 > Shallow lung inflation with secondary bibasilar atelectasis and bronchovascular crowding.  CULTURES: Urine 4/1 >> Blood 4/1 >>  ANTIBIOTICS: vanc 4/1 >> 4/3 zosyn 4/1 >>  4/3  SIGNIFICANT EVENTS: Admitted for hypoxia, hypotension, AKI.   LINES/TUBES: PIV   DISCUSSION: 66 year old female with extensive health history. From SNF with dyspnea/hypotension and volume overload secondary to CHF. Goals of care discussion with family > to comfort care  ASSESSMENT / PLAN:  PULMONARY A: Acute hypoxic respiratory failure secondary to volume overload vs PNA  P:   Manage sx with narcotics as needed  CARDIOVASCULAR A:  Acute on Chronic Diastolic Heart Failure  Hypotension  P:  Neo gtt to off  RENAL A:   Acute Kidney Injury  Lactic Acidosis - down-trending  Hypokalemia  P:   No further labs  GASTROINTESTINAL A:   H/O Hepatic Cirrhosis with ascites  P:   NPO  HEMATOLOGIC A:   Chronic Anemia Met. 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

## 2016-12-20 NOTE — Progress Notes (Signed)
FrEddie Dibbles from [place] came and administered the last Sacrament of the Sick to pt, while her brothers and son and I were at bedside and prayed with him. Pt was still aware, prayed too,  and greatly appreciated the priest's coming. Chaplain available for f/u.    12/20/16 0100  Clinical Encounter Type  Visited With Patient and family together;Health care provider;Other (Comment) [facility] priest)  Visit Type Follow-up;Psychological support;Spiritual support;Social support;Patient actively dying  Referral From [place] Prayer;Ritual;Emotional;Grief support  Stress Factors  Patient Stress Factors Other (Comment) (dying)  Family Stress Factors Loss   Gerrit Heck, Chaplain

## 2016-12-20 NOTE — Progress Notes (Signed)
Family requested to stop neo.  Patient comfortable and family in the room with patient.

## 2016-12-20 NOTE — Progress Notes (Signed)
Responded to page from nurse that pt's son requested [employer] priest be called. Provided emotional/spiritual support and prayer to son MIchael, which he appreciated. Called or texted four [place], but so far none have responded. Pt's two brothers arrived, provided emotional/spiritual and prayer to all in rm, including pt and nurse - which was especially meaningful as pt is still able to understand and speak. Chaplain available for f/u.   12/20/16 0000  Clinical Encounter Type  Visited With Patient and family together;Health care provider  Visit Type Initial;Follow-up;Psychological support;Spiritual support;Social support;Patient actively dying  Referral From Nurse  Spiritual Encounters  Spiritual Needs Prayer;Ritual;Emotional;Grief support  Stress Factors  Patient Stress Factors Other (Comment) (dying)  Family Stress Factors Loss   Gerrit Heck, Chaplain

## 2016-12-20 NOTE — Progress Notes (Signed)
Daily Progress Note   Patient Name: Alexandria [name]       Date: 12/20/2016 DOB: 1951-04-06  Age: 66 y.o. MRN#: [id number] Attending Physician: Juanito Doom, MD Primary Care Physician: Nyoka Cowden, MD Admit Date: 12/28/2016  Reason for Consultation/Follow-up: Establishing goals of care and Hospice Evaluation d/t metastatic breast cancer to liver with underlying cirrhosis. Multiple hospitalizations with continued decline.   Subjective: Ms. Dudenhoeffer is resting comfortable surrounded by family.   Length of Stay: 2  Current Medications: Scheduled Meds:  . escitalopram  10 mg Oral BID  . lidocaine (PF)  5 mL Intradermal Once  . mouth rinse  15 mL Mouth Rinse BID  . montelukast  5 mg Oral QHS  . nystatin cream   Topical BID  . pantoprazole  40 mg Oral BID    Continuous Infusions: . phenylephrine (NEO-SYNEPHRINE) Adult infusion Stopped (12/20/16 0030)    PRN Meds: sodium chloride, acetaminophen **OR** acetaminophen, albuterol, antiseptic oral rinse, glycopyrrolate **OR** glycopyrrolate **OR** glycopyrrolate, HYDROmorphone (DILAUDID) injection, LORazepam **OR** LORazepam, ondansetron **OR** ondansetron (ZOFRAN) IV, [DISCONTINUED] oxyCODONE **OR** oxyCODONE, polyvinyl alcohol  Physical Exam  Constitutional: She has a sickly appearance.  Pallor  Cardiovascular: Normal rate.   Pulmonary/Chest: Effort normal. No accessory muscle usage. Apnea noted. No tachypnea. No respiratory distress.  Abdominal: She exhibits distension.  Neurological: She is unresponsive.  Nursing note and vitals reviewed.           Vital Signs: BP (!) 92/47   Pulse 71   Temp 98.3 F (36.8 C) (Oral)   Resp 15   Ht '5\' 6"'$  (1.676 m)   Wt 115.8 kg (255 lb 4.7 oz)   LMP 01/30/2005   SpO2 (!) 89%   BMI 41.21  kg/m  SpO2: SpO2: (!) 89 % O2 Device: O2 Device: Nasal Cannula O2 Flow Rate: O2 Flow Rate (L/min): 6 L/min  Intake/output summary:  Intake/Output Summary (Last 24 hours) at 12/20/16 0831 Last data filed at 12/20/16 0300  Gross per 24 hour  Intake           928.75 ml  Output                0 ml  Net           928.75 ml   LBM: Last BM Date: 12/19/16 Baseline Weight: Weight:  112.6 kg (248 lb 3.8 oz) Most recent weight: Weight: 115.8 kg (255 lb 4.7 oz)       Palliative Assessment/Data:      Patient Active Problem List   Diagnosis Date Noted  . Hepatorenal failure ([place])   . Palliative care encounter   . Goals of care, counseling/discussion 12/14/2016  . Leukocytosis   . Metastatic breast cancer ([place]) 11/11/2016  . Status post laparoscopic Nissen fundoplication 99/24/2683  . Asthma 08/21/2016  . Malignant neoplasm of upper-outer quadrant of female breast ([place])   . Hyperlipidemia 10/23/2014  . Acute respiratory failure with hypoxia ([place]) 09/07/2014  . Lymphedema 09/07/2014  . Nocturnal hypoxemia 08/18/2014  . Coronary artery calcification 05/22/2014  . Mediastinal lymphadenopathy 01/29/2014  . Hx of radiation therapy   . Vocal cord dysfunction 04/15/2013  . Acute on chronic diastolic CHF (congestive heart failure) ([place]) 04/14/2013  . Anxiety state 04/14/2013  . Hypokalemia 04/13/2013  . AKI (acute kidney injury) ([place]) 04/13/2013  . Iron deficiency anemia 04/13/2013  . Chronic back pain 04/08/2013  . Morbid obesity ([place]) 04/08/2013  . Adjustment disorder with mixed anxiety and depressed mood 04/08/2013  . Lower extremity edema 04/08/2013  . Chronic constipation 04/08/2013  . Breast cancer, right breast ([place]) 02/21/2013    Palliative Care Assessment & Plan   HPI: 66 y.o. female  with past medical history of breast cancer s/p chemo/radiation 2014 with recurrent metastatic breast cancer liver mets discovered incidentally when hospitalized 11/03/16-11/11/16 for hiatal  hernia repair with Nissen fundoplication. Following this surgery she came to ED 11/23/16 with severe GIB from ulceration at surgical anastomosis and required prolonged ICU stay with intubation and shock followed by delirium, cirrhosis, acute CHF, and pannus cellulitis. Has continued to decline since past hospitalizations. Admitted on 12/25/2016 with BLE and found to be hypoxic and hypotensive.    Assessment: I met with family today with Ms. Tiner - son Legrand Como and her 2 brothers at bedside. They are very concerned about her comfort and she has required multiple Fentanyl bolus with little to no relief (RN confirmed). After much discussion (and Ms. Schaben is now resting comfortably and unable to participate) we have decided to start an infusion to ensure comfort. Family believes that she has suffered enough.   They are appreciative of chaplain and priest that came last night and feel this has helped Ms. Desantiago and themselves have more peace with her passing. We discussed prognosis which is likely hours to a couple days - difficult to know more specific at this time. Emotional support provided.   Recommendations/Plan:  Pain/dyspnea: Hydromorphone infusion 0.5 mg/hr with 0.5-1 mg bolus every 30 min prn. Oxycodone elixir 15 mg every 2 hours prn if loses IV access. May also begin as SQ infusion if loses IV access and PIV unable to restart.    Anxiety: Lorazepam 1 mg IV/po every 4 hours prn.   Secretions: Robinul prn.    Goals of Care and Additional Recommendations:  Limitations on Scope of Treatment: Full Comfort Care, No Artificial Feeding, No Blood Transfusions, No Chemotherapy, No Diagnostics, No Glucose Monitoring, No Hemodialysis, No IV Antibiotics, No IV Fluids, No Lab Draws and No Radiation  Code Status:  DNR  Prognosis:   Hours - Days  Discharge Planning:  Anticipated Hospital Death  Thank you for allowing the Palliative Medicine Team to assist in the care of this patient.   Total Time  53mn Prolonged Time Billed  no       Greater than 50%  of this time was spent counseling and coordinating care related to the above assessment and plan.  Vinie Sill, NP Palliative Medicine Team Pager # [phone] (M-F 8a-5p) Team Phone # [phone] (Nights/Weekends)

## 2016-12-21 MED ORDER — GLYCOPYRROLATE 0.2 MG/ML IJ SOLN
0.2000 mg | INTRAMUSCULAR | Status: DC
  Administered 2016-12-21 – 2016-12-22 (×4): 0.2 mg via INTRAVENOUS
  Filled 2016-12-21 (×4): qty 1

## 2016-12-21 NOTE — Progress Notes (Signed)
Daily Progress Note   Patient Name: Alexandria [name]       Date: 12/21/2016 DOB: March 26, 1951  Age: 66 y.o. MRN#: [id number] Attending Physician: Juanito Doom, MD Primary Care Physician: Nyoka Cowden, MD Admit Date: 01/01/2017  Reason for Consultation/Follow-up: Establishing goals of care and Hospice Evaluation d/t metastatic breast cancer to liver with underlying cirrhosis. Multiple hospitalizations with continued decline.   Subjective: Alexandria [name] is actively dying, surrounded by family.   Length of Stay: 3  Current Medications: Scheduled Meds:  . lidocaine (PF)  5 mL Intradermal Once  . mouth rinse  15 mL Mouth Rinse BID  . nystatin cream   Topical BID    Continuous Infusions: . HYDROmorphone 0.5 mg/hr (12/20/16 1949)    PRN Meds: sodium chloride, acetaminophen **OR** acetaminophen, albuterol, antiseptic oral rinse, glycopyrrolate **OR** glycopyrrolate **OR** glycopyrrolate, HYDROmorphone, LORazepam **OR** LORazepam, ondansetron **OR** ondansetron (ZOFRAN) IV, [DISCONTINUED] oxyCODONE **OR** oxyCODONE, polyvinyl alcohol  Physical Exam  Constitutional: She has a sickly appearance.  Pallor  Cardiovascular: Normal rate.   Pulmonary/Chest: No accessory muscle usage. Apnea noted. No tachypnea. No respiratory distress.  Irregular  Abdominal: She exhibits distension.  Neurological: She is unresponsive.  Nursing note and vitals reviewed.           Vital Signs: BP (!) 92/47   Pulse 72   Temp 98.3 F (36.8 C) (Oral)   Resp 10   Ht 5\' 6"  (1.676 m)   Wt 115.8 kg (255 lb 4.7 oz)   LMP 01/30/2005   SpO2 93%   BMI 41.21 kg/m  SpO2: SpO2: 93 % O2 Device: O2 Device: Nasal Cannula O2 Flow Rate: O2 Flow Rate (L/min): 6 L/min  Intake/output summary:   Intake/Output Summary  (Last 24 hours) at 12/21/16 1055 Last data filed at 12/21/16 0800  Gross per 24 hour  Intake              242 ml  Output                0 ml  Net              242 ml   LBM: Last BM Date: 12/20/16 Baseline Weight: Weight: 112.6 kg (248 lb 3.8 oz) Most recent weight: Weight: 115.8 kg (255 lb 4.7 oz)       Palliative  Assessment/Data:      Patient Active Problem List   Diagnosis Date Noted  . Hepatorenal failure ([place])   . Palliative care encounter   . Goals of care, counseling/discussion 12/14/2016  . Leukocytosis   . Metastatic breast cancer ([place]) 11/11/2016  . Status post laparoscopic Nissen fundoplication 84/13/2440  . Asthma 08/21/2016  . Malignant neoplasm of upper-outer quadrant of female breast ([place])   . Hyperlipidemia 10/23/2014  . Acute respiratory failure with hypoxia ([place]) 09/07/2014  . Lymphedema 09/07/2014  . Nocturnal hypoxemia 08/18/2014  . Coronary artery calcification 05/22/2014  . Mediastinal lymphadenopathy 01/29/2014  . Hx of radiation therapy   . Vocal cord dysfunction 04/15/2013  . Acute on chronic diastolic CHF (congestive heart failure) ([place]) 04/14/2013  . Anxiety state 04/14/2013  . Hypokalemia 04/13/2013  . AKI (acute kidney injury) ([place]) 04/13/2013  . Iron deficiency anemia 04/13/2013  . Chronic back pain 04/08/2013  . Morbid obesity ([place]) 04/08/2013  . Adjustment disorder with mixed anxiety and depressed mood 04/08/2013  . Lower extremity edema 04/08/2013  . Chronic constipation 04/08/2013  . Breast cancer, right breast ([place]) 02/21/2013    Palliative Care Assessment & Plan   HPI: 66 y.o. female  with past medical history of breast cancer s/p chemo/radiation 2014 with recurrent metastatic breast cancer liver mets discovered incidentally when hospitalized 11/03/16-11/11/16 for hiatal hernia repair with Nissen fundoplication. Following this surgery she came to ED 11/23/16 with severe GIB from ulceration at surgical anastomosis and required  prolonged ICU stay with intubation and shock followed by delirium, cirrhosis, acute CHF, and pannus cellulitis. Has continued to decline since past hospitalizations. Admitted on 12/29/2016 with BLE and found to be hypoxic and hypotensive.    Assessment: Alexandria [name] is progressing at EOL. She is having increasing episodes of apnea. Discussed further with family signs at EOL and what to expect. We discussed funeral arrangements. Discussed transition to regular floor - they are resistant but understand that the ICU bed may be needed as well. Will transition to 6N if needed. Emotional support provided.   I also updated her husband, Peterson Ao, via telephone. He has not been able to see her in this condition.   Recommendations/Plan:  Pain/dyspnea: Hydromorphone infusion 0.5 mg/hr with 0.5-1 mg bolus every 30 min prn. Oxycodone elixir 15 mg every 2 hours prn if loses IV access. May also begin as SQ infusion if loses IV access and PIV unable to restart.    Anxiety: Lorazepam 1 mg IV/po every 4 hours prn.   Secretions: Robinul 0.2 mg every 4 hours scheduled.    Goals of Care and Additional Recommendations:  Limitations on Scope of Treatment: Full Comfort Care, No Artificial Feeding, No Blood Transfusions, No Chemotherapy, No Diagnostics, No Glucose Monitoring, No Hemodialysis, No IV Antibiotics, No IV Fluids, No Lab Draws and No Radiation  Code Status:  DNR  Prognosis:   Hours - Days  Discharge Planning:  Anticipated Hospital Death  Thank you for allowing the Palliative Medicine Team to assist in the care of this patient.   Total Time 16min Prolonged Time Billed  no       Greater than 50%  of this time was spent counseling and coordinating care related to the above assessment and plan.  Vinie Sill, NP Palliative Medicine Team Pager # [phone] (M-F 8a-5p) Team Phone # [phone] (Nights/Weekends)

## 2016-12-21 NOTE — Progress Notes (Signed)
Chaplain Note:  Family requested information on funeral homes which provided [employer]. I identified several and indicated the ones which only provided Crem[employer] services. Talked with patient's two brothers who expressed relief that the decision had been made to move to comfort care. They spoke of her increasing level of pain and suffering and felt that this was the right thing to do for her now.    Will be available for any further support as needed or requested.   Sue Lush.

## 2016-12-21 NOTE — Progress Notes (Signed)
PULMONARY / CRITICAL CARE MEDICINE   Name: Alexandria [name] MRN: [id number] DOB: 11-25-1950    ADMISSION DATE:  01/05/2017 CONSULTATION DATE:  12/18/2016  REFERRING MD:  Dr. 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 Presents from SNF on 4/1 with lower ext swelling, hypoxia, and hypotension. Septic vs hypovolemic shock. Code discussion with family on 4/2 > partial code with palliative care consult.   Recent admission for Nissen fundoplication for hiatal hernia 2/16-2/24, then went to SNF from there but came in to ER 3/8 with massive UGI bleed from a large ulcer at the surgical anastomosis site causing hypovolemic shock/required intubation and prolonged ICU admission. Also had metabolic encephalopathy from ICU delirium and cirrhosis along with acute CHF, pannus cellulitis. Discharged on 3/27 to SNF with augmentin for cellulitis, lactulose for cirrhosis, and lasix 41m bid for CHF.   SUBJECTIVE:  No significant interval change She is on dilaudid gtt, agonal respiratory pattern Appears comfortable.   VITAL SIGNS: BP (!) 92/47   Pulse 72   Temp 98.3 F (36.8 C) (Oral)   Resp 10   Ht 5' 6"  (1.676 m)   Wt 115.8 kg (255 lb 4.7 oz)   LMP 01/30/2005   SpO2 93%   BMI 41.21 kg/m   HEMODYNAMICS:    VENTILATOR SETTINGS:    INTAKE / OUTPUT: I/O last 3 completed shifts: In: 707.3 [I.V.:707.3] Out: -   PHYSICAL EXAMINATION: General: chronically ill, over wt woman, comfortable Neuro:  obtunded HEENT:  No oral lesions Cardiovascular:  RRR, no MRG, NI S1/S2  Lungs:  Shallow breaths, no increased WOB Abdomen:  Soft, benign Musculoskeletal:  2- 3+ edema Skin:  No rash  LABS:  BMET  Recent Labs Lab 01/10/2017 2121 12/18/16 0521  NA 139 142  K 3.0* 3.2*  CL 100* 105  CO2 22 24  BUN 18 18  CREATININE 2.70* 2.65*  GLUCOSE 114* 100*     Electrolytes  Recent Labs Lab 12/23/2016 2121 12/18/16 0521  CALCIUM 9.0 8.7*    CBC  Recent Labs Lab 01/02/2017 2121 12/18/16 0521  WBC 10.2 14.4*  HGB 11.4* 10.7*  HCT 35.4* 33.3*  PLT 200 156    Coag's  Recent Labs Lab 12/18/2016 2121  INR 1.61    Sepsis Markers  Recent Labs Lab 01/06/2017 2128 12/18/16 0049 12/18/16 0521 12/18/16 0801  LATICACIDVEN 5.48* 4.31*  --  2.9*  PROCALCITON  --   --  1.39  --     ABG  Recent Labs Lab 01/14/2017 2056  PHART 7.404  PCO2ART 38.2  PO2ART 63.0*    Liver Enzymes  Recent Labs Lab 12/18/2016 2121  AST 200*  ALT 75*  ALKPHOS 184*  BILITOT 10.4*  ALBUMIN 2.1*    Cardiac Enzymes No results for input(s): TROPONINI, PROBNP in the last 168 hours.  Glucose No results for input(s): GLUCAP in the last 168 hours.  Imaging No results found.   STUDIES:  CXR 4/2 > Shallow lung inflation with secondary bibasilar atelectasis and bronchovascular crowding.  CULTURES: Urine 4/1 >> Blood 4/1 >>  ANTIBIOTICS: vanc 4/1 >> 4/3 zosyn 4/1 >>  4/3  SIGNIFICANT EVENTS: Admitted for hypoxia, hypotension, AKI.   LINES/TUBES: PIV   DISCUSSION: 66year old female with extensive health history. From SNF with dyspnea/hypotension and volume overload secondary to CHF. Goals of care discussion with family > to  comfort care   ASSESSMENT / PLAN:  PULMONARY A: Acute hypoxic respiratory failure secondary to volume overload vs PNA  P:   dilaudid gtt, titrate for comfort  CARDIOVASCULAR A:  Acute on Chronic Diastolic Heart Failure  Hypotension  P:  Neo gtt now off  RENAL A:   Acute Kidney Injury  Lactic Acidosis - down-trending  Hypokalemia  P:   No further labs  GASTROINTESTINAL A:   H/O Hepatic Cirrhosis with ascites  P:   NPO  HEMATOLOGIC A:   Chronic Anemia Met. 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 Comfort focus  FAMILY  - Updates: updated son and brothers 85/5  - Inter-disciplinary family meet or Palliative Care meeting due by:  Ongoing     Baltazar Apo, MD, PhD 12/21/2016, 9:59 AM [facility] [phone] or if no answer [phone]

## 2016-12-22 LAB — CULTURE, BLOOD (ROUTINE X 2)
CULTURE: NO GROWTH
CULTURE: NO GROWTH
SPECIAL REQUESTS: ADEQUATE
SPECIAL REQUESTS: ADEQUATE

## 2016-12-22 MED ORDER — WHITE PETROLATUM GEL
Status: AC
  Administered 2016-12-22: 09:00:00
  Filled 2016-12-22: qty 1

## 2016-12-25 ENCOUNTER — Telehealth: Payer: Self-pay

## 2016-12-25 NOTE — Telephone Encounter (Signed)
On 12/25/2016 I received a death certificate from [place] (original). The death certificate is for cremation. The patient is a patient of Doctor Byrum. The death certificate will be taken to [facility] ICU this pm for signature.  On 01-16-17 I received the death certificate back from Doctor Byrum. I got the death certificate ready and called the funeral home to let them know the death certificate is ready for pickup. I also faxed a copy to the funeral home per the funeral home request.

## 2016-12-29 ENCOUNTER — Other Ambulatory Visit: Payer: Self-pay | Admitting: Nurse Practitioner

## 2017-01-16 NOTE — Progress Notes (Signed)
Palliative Medicine RN Note:  PMT RN visit to check symptoms. Son and 2 brothers at bedside. Family reports that breathing has changed drastically since this morning. Discussed s/s imminent demise and likelihood of death today. Family verbalized understanding.  During my visit, pt took 3 breaths with long periods of apnea then stopped breathing all together at 1024. No respirations or pulse for 3 minutes. Called RN Apolonio Schneiders to confirm.   Family will use [facility] [employer]. Supported them and gave them permission to stay at the bedside. They finished their visiting and left at 1036.   Marjie Skiff Jaynell Castagnola, RN, BSN, [facility] 01-11-2017 10:36 AM Cell [phone] 8:00-4:00 Monday-Friday Office [phone]

## 2017-01-16 NOTE — Progress Notes (Addendum)
10:40 AM Wasted 31ml left in dilaudid bag and tubing with [place], Therapist, sports.   Critical Care paged to make aware of patient's death. Palliative NP at beside with RN and confirmed death at 33.  Family at bedside and support provided.   1100am 2nd critical care number paged.  11:59 AM Einar Gip, MD made aware and she stated that PCCM will complete death certificate.

## 2017-01-16 NOTE — Consult Note (Signed)
   [facility] CM Inpatient Consult   2017/01/12  [name] Apr 21, 1951 [id number]   Chart reviewed for multiple admissions in the [place].  Chart noted for comfort care.  [facility] Care Management not appropriate for community follow up and will sign off. For questions, please contact:  Natividad Brood, RN BSN [facility] Liaison  [phone] business mobile phone Toll free office [phone]

## 2017-01-16 DEATH — deceased

## 2017-01-24 ENCOUNTER — Ambulatory Visit: Payer: Self-pay | Admitting: Gastroenterology

## 2017-02-16 NOTE — Discharge Summary (Signed)
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 Recent admission for Nissen fundoplication for hiatal hernia 2/16-2/24, then went to SNF from there but came in to ER 3/8 with massive UGI bleed from a large ulcer at the surgical anastomosis site causing hypovolemic shock/required intubation and prolonged ICU admission. Also had metabolic encephalopathy from ICU delirium and cirrhosis along with acute CHF, pannus cellulitis. Discharged on 3/27 to SNF with augmentin for cellulitis, lactulose for cirrhosis, and lasix 40mg  bid for CHF. Brought back from SNF on 4/1 with lower ext swelling, hypoxia, and hypotension. Septic vs hypovolemic shock. She was supported with pressors, treated with broad-spectrum antibiotics and IV fluids. Discussions were undertaken with the patient and with her family. It was felt that the combination of her metastatic breast cancer in her liver disease precluded a significant recovery. The decision was made to make her comfortable.  She expired on Jan 07, 2017.    Baltazar Apo, MD, PhD 01/17/2017, 7:38 AM [facility] [phone] or if no answer [phone]

## 2017-03-30 NOTE — Anesthesia Postprocedure Evaluation (Signed)
Anesthesia Post Note  Patient: Alexandria [name]  Procedure(s) Performed: Procedure(s) (LRB): LAPAROSCOPIC NISSEN AND  REPAIR OF HIATAL HERNIA, (N/A)     Anesthesia Post Evaluation  Last Vitals:  Vitals:   11/11/16 0600 11/11/16 0936  BP: (!) 112/55 125/74  Pulse: 77 75  Resp:  16  Temp: 36.9 C 36.7 C    Last Pain:  Vitals:   11/11/16 1030  TempSrc:   PainSc: Asleep                 Spike Desilets EDWARD

## 2017-03-30 NOTE — Addendum Note (Signed)
Addendum  created 03/30/17 1711 by Lyndle Herrlich, MD   Sign clinical note

## 2017-10-13 IMAGING — DX DG ABD PORTABLE 1V
1 series · 1 of 1 positions shown · non-contrast
Comparison: CT abdomen pelvis earlier today and 11/15/2016.

CLINICAL DATA: OG tube placement at bedside.

EXAM:
PORTABLE ABDOMEN - 1 VIEW

[abdomen kub]
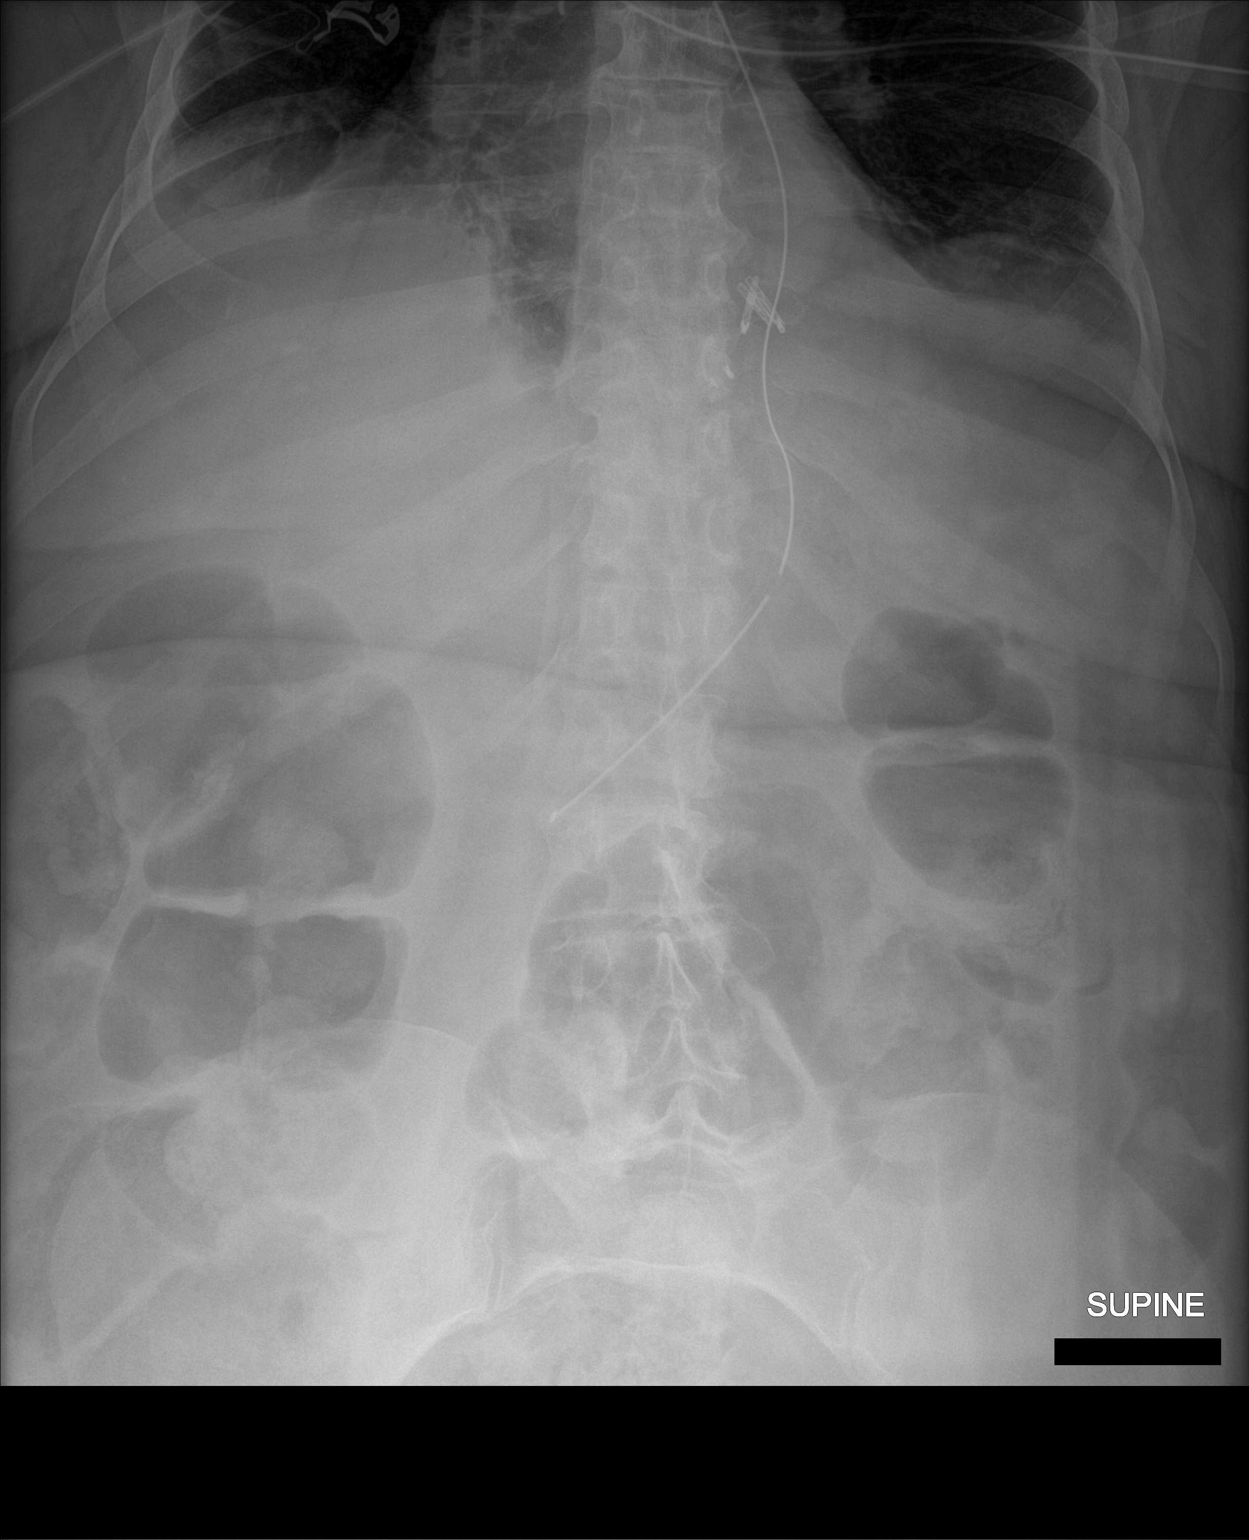

[1 of 1 positions shown; findings below may reference images not displayed]

FINDINGS: OG tube tip in the distal body of the stomach. Mild gaseous
distention of the visualized transverse colon with moderate stool
burden. No other distended bowel.
IMPRESSION: 1. OG tube tip in the distal body of the stomach.
2. Probable mild colonic ileus.

## 2017-10-17 IMAGING — DX DG CHEST 1V PORT
1 series · 1 of 1 positions shown · non-contrast
Comparison: 11/26/2016 .

CLINICAL DATA: Respiratory failure.

EXAM:
PORTABLE CHEST 1 VIEW

[chest ap]
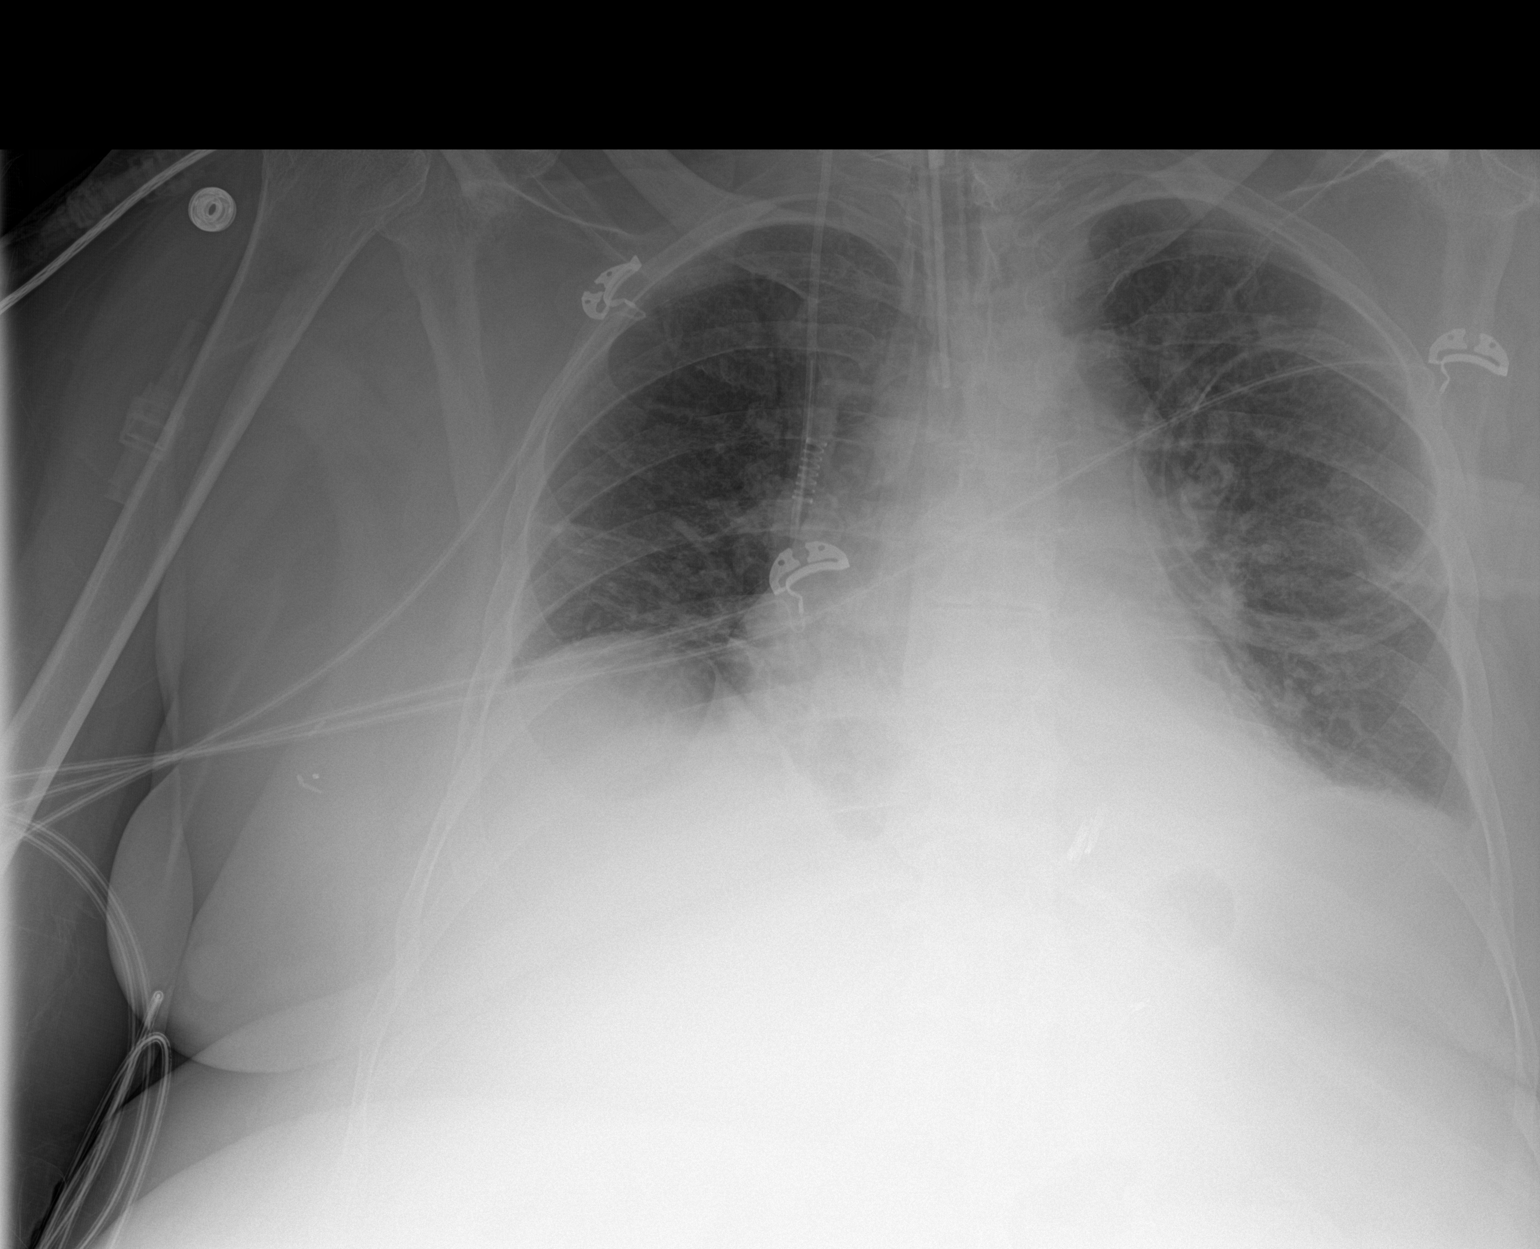

[1 of 1 positions shown; findings below may reference images not displayed]

FINDINGS: Endotracheal tube, right IJ line in stable position. Heart size
stable. Bibasilar atelectasis again noted. Interim slight
improvement. Small left pleural effusion. No pneumothorax. Basilar
subsegmental atelectasis. Small left pleural effusion. No
pneumothorax.
IMPRESSION: 1. Lines and tubes in stable position.

2.  Bibasilar atelectasis again noted.  Interim slight improvement.

## 2017-10-17 IMAGING — CT CT HEAD W/O CM
3 series · 15 of 47 positions shown, 18 images · non-contrast
Comparison: None.

CLINICAL DATA: Unresponsive this morning. Acute encephalopathy.
Metastatic breast cancer.

EXAM:
CT HEAD WITHOUT CONTRAST
TECHNIQUE: Contiguous axial images were obtained from the base of the skull
through the vertex without intravenous contrast.

[Series 2: head wo · axial · 0.47mm/px · z∈[+1535,+1675]mm · 9 of 34 slices shown, 12 images]
[im 3/34  brain]
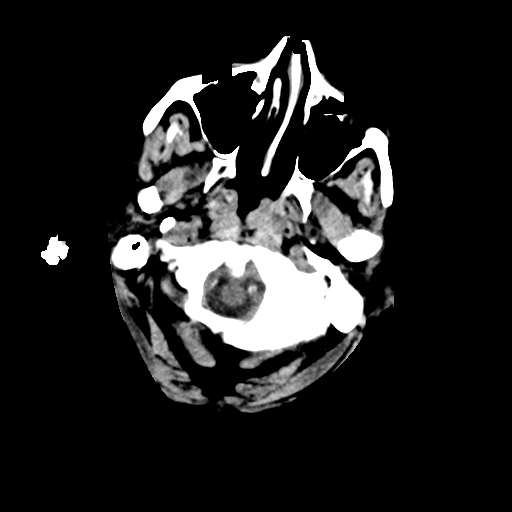
[im 3/34  bone]
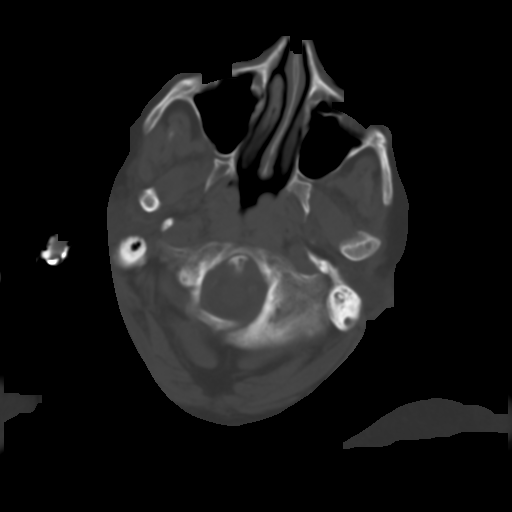
[im 6/34  brain]
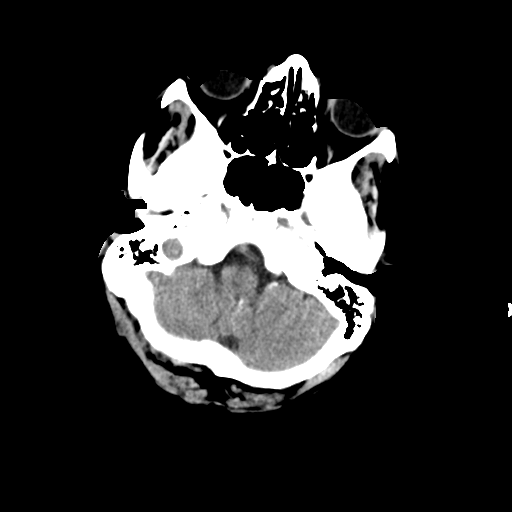
[im 10/34  brain]
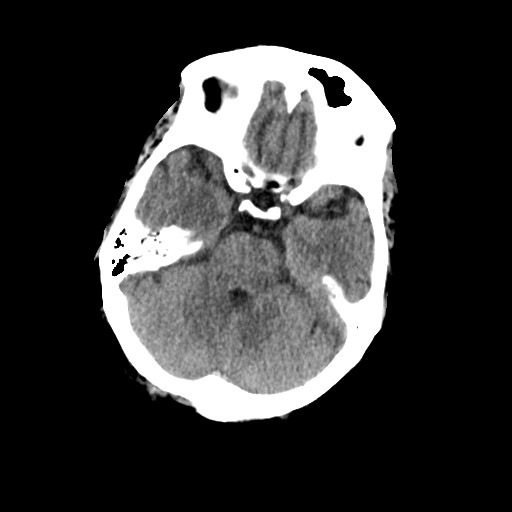
[im 13/34  brain]
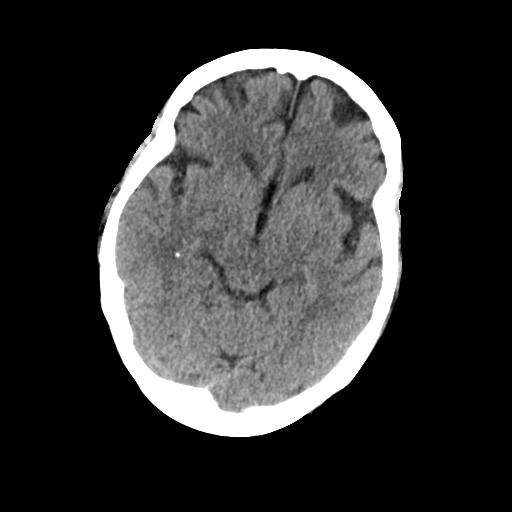
[im 18/34  brain]
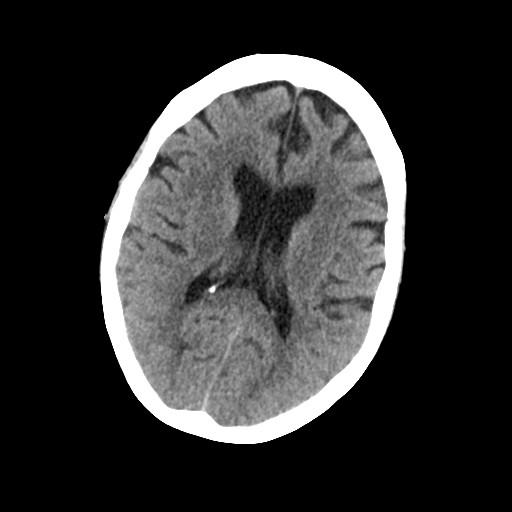
[im 18/34  bone]
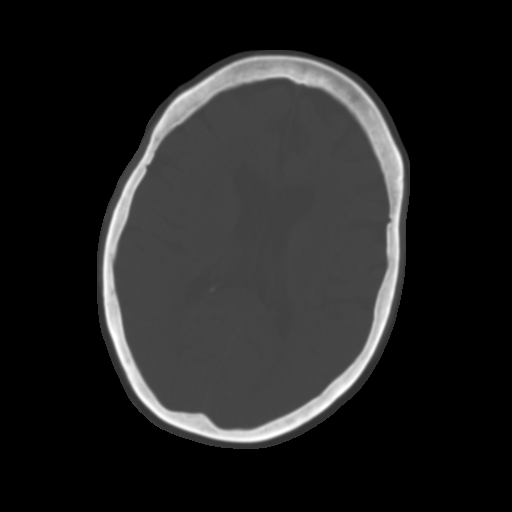
[im 21/34  brain]
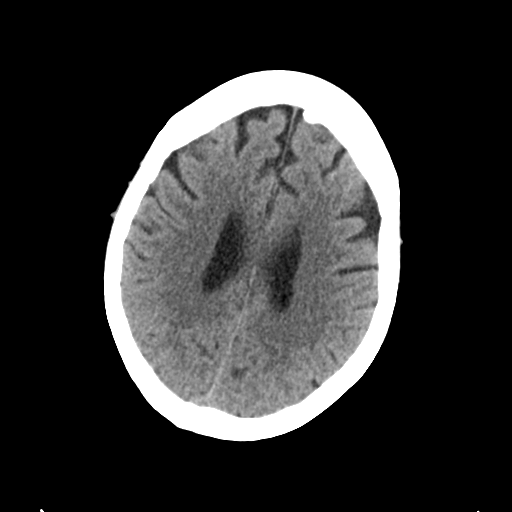
[im 24/34  brain]
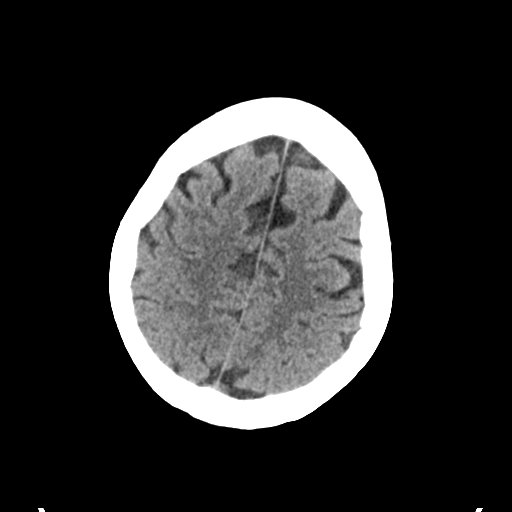
[im 28/34  brain]
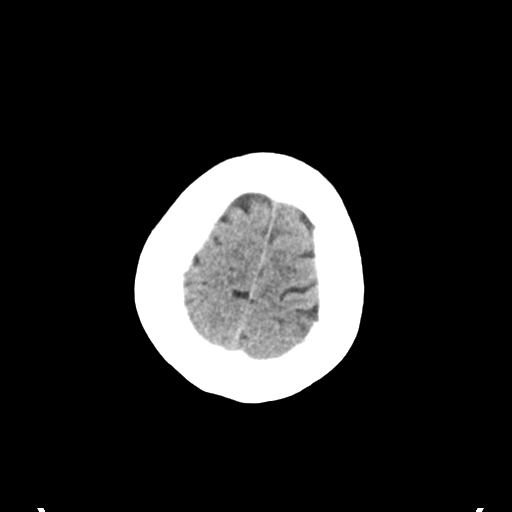
[im 31/34  brain]
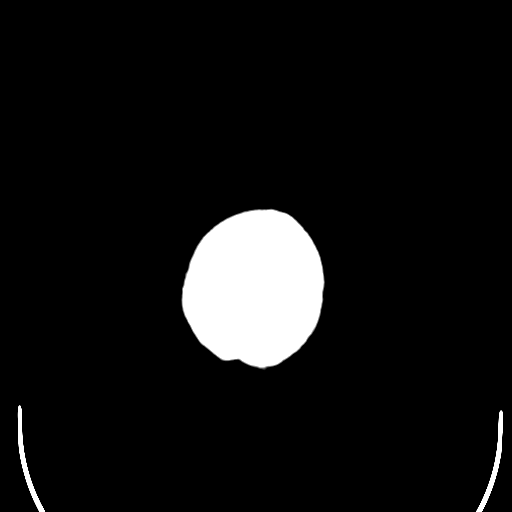
[im 31/34  bone]
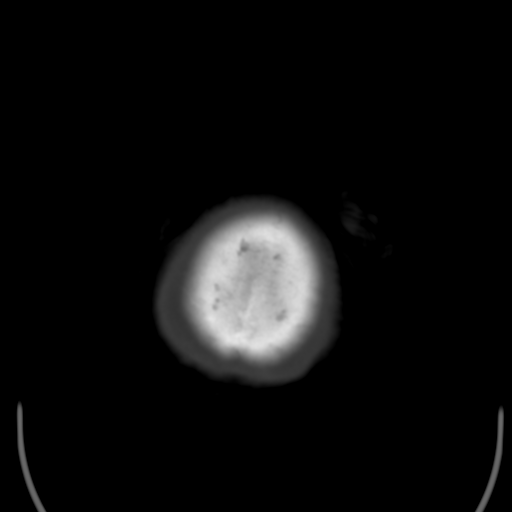

[Series 4: coronal soft tissue · coronal · 0.33mm/px · 3 of 71 slices shown]
[im 24/71  brain]
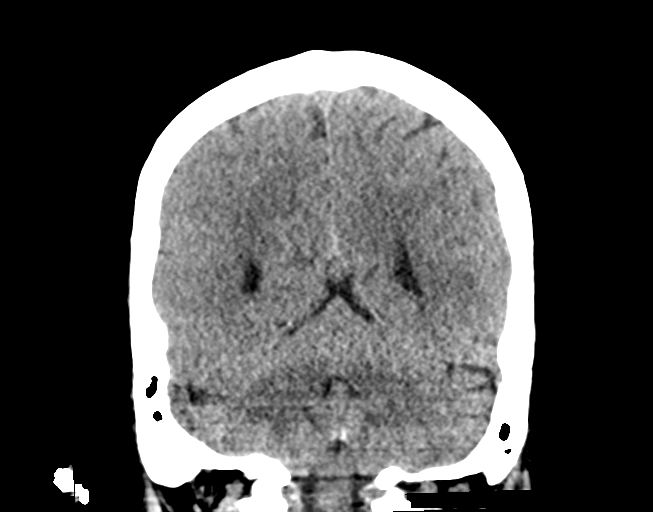
[im 32/71  brain]
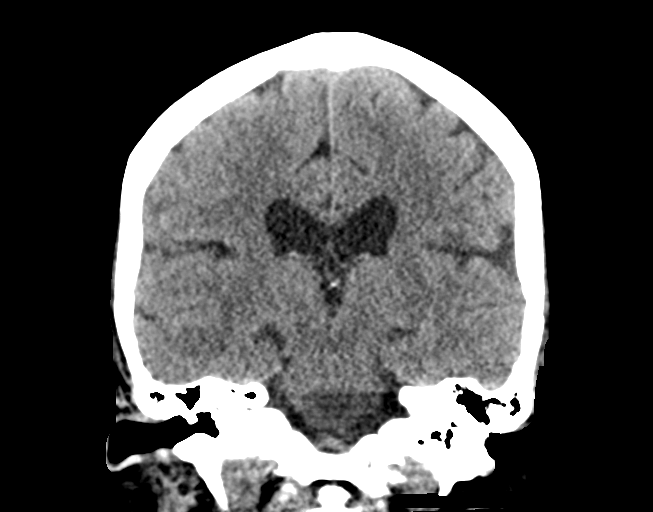
[im 39/71  brain]
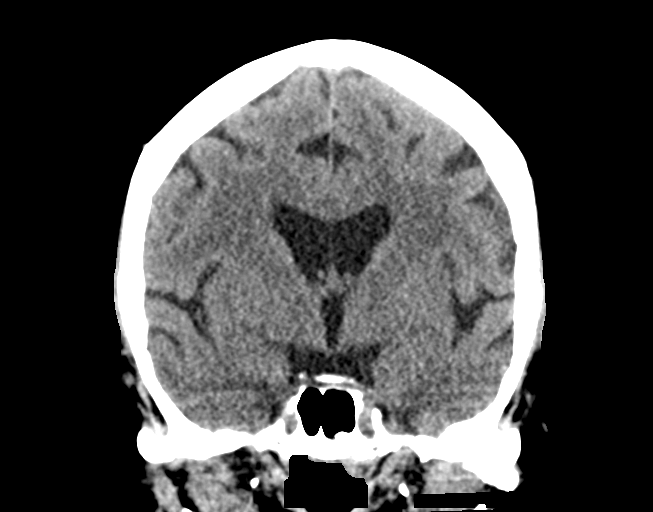

[Series 5: sagittal soft tissue · sagittal · 0.39mm/px · 3 of 67 slices shown]
[im 23/67  brain]
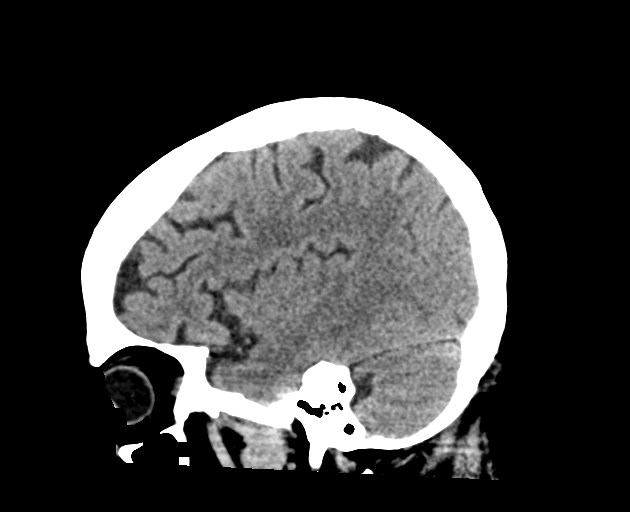
[im 34/67  brain]
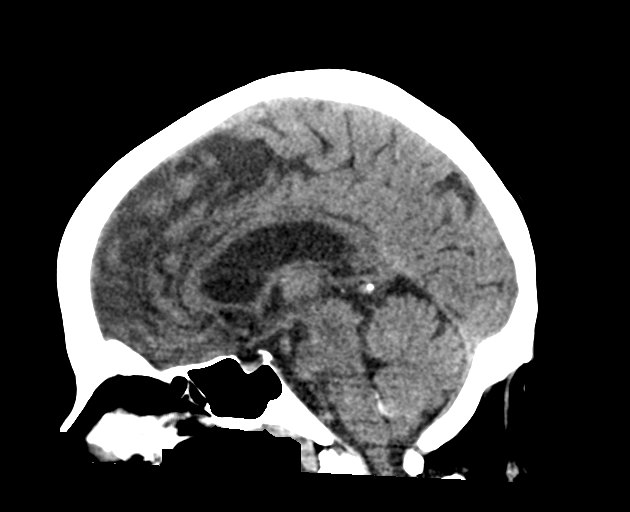
[im 45/67  brain]
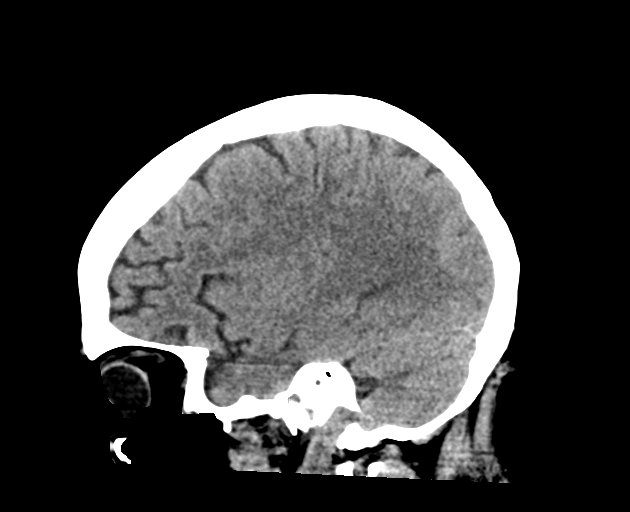

[15 of 47 positions shown; findings below may reference images not displayed]

FINDINGS: Brain: Diffusely enlarged ventricles and subarachnoid spaces. No
intracranial hemorrhage, mass lesion or CT evidence of acute
infarction.

Vascular: No hyperdense vessel or unexpected calcification.

Skull: Mild bilateral mastoid opacification. No evidence of bony
metastatic disease.

Sinuses/Orbits: No acute finding.

Other: None.
IMPRESSION: No acute abnormality.  Mild diffuse cerebral atrophy.

## 2017-10-18 IMAGING — DX DG CHEST 1V PORT
1 series · 1 of 1 positions shown · non-contrast
Comparison: 11/27/2016

CLINICAL DATA: Atelectasis, intubated

EXAM:
PORTABLE CHEST 1 VIEW

[chest ap]
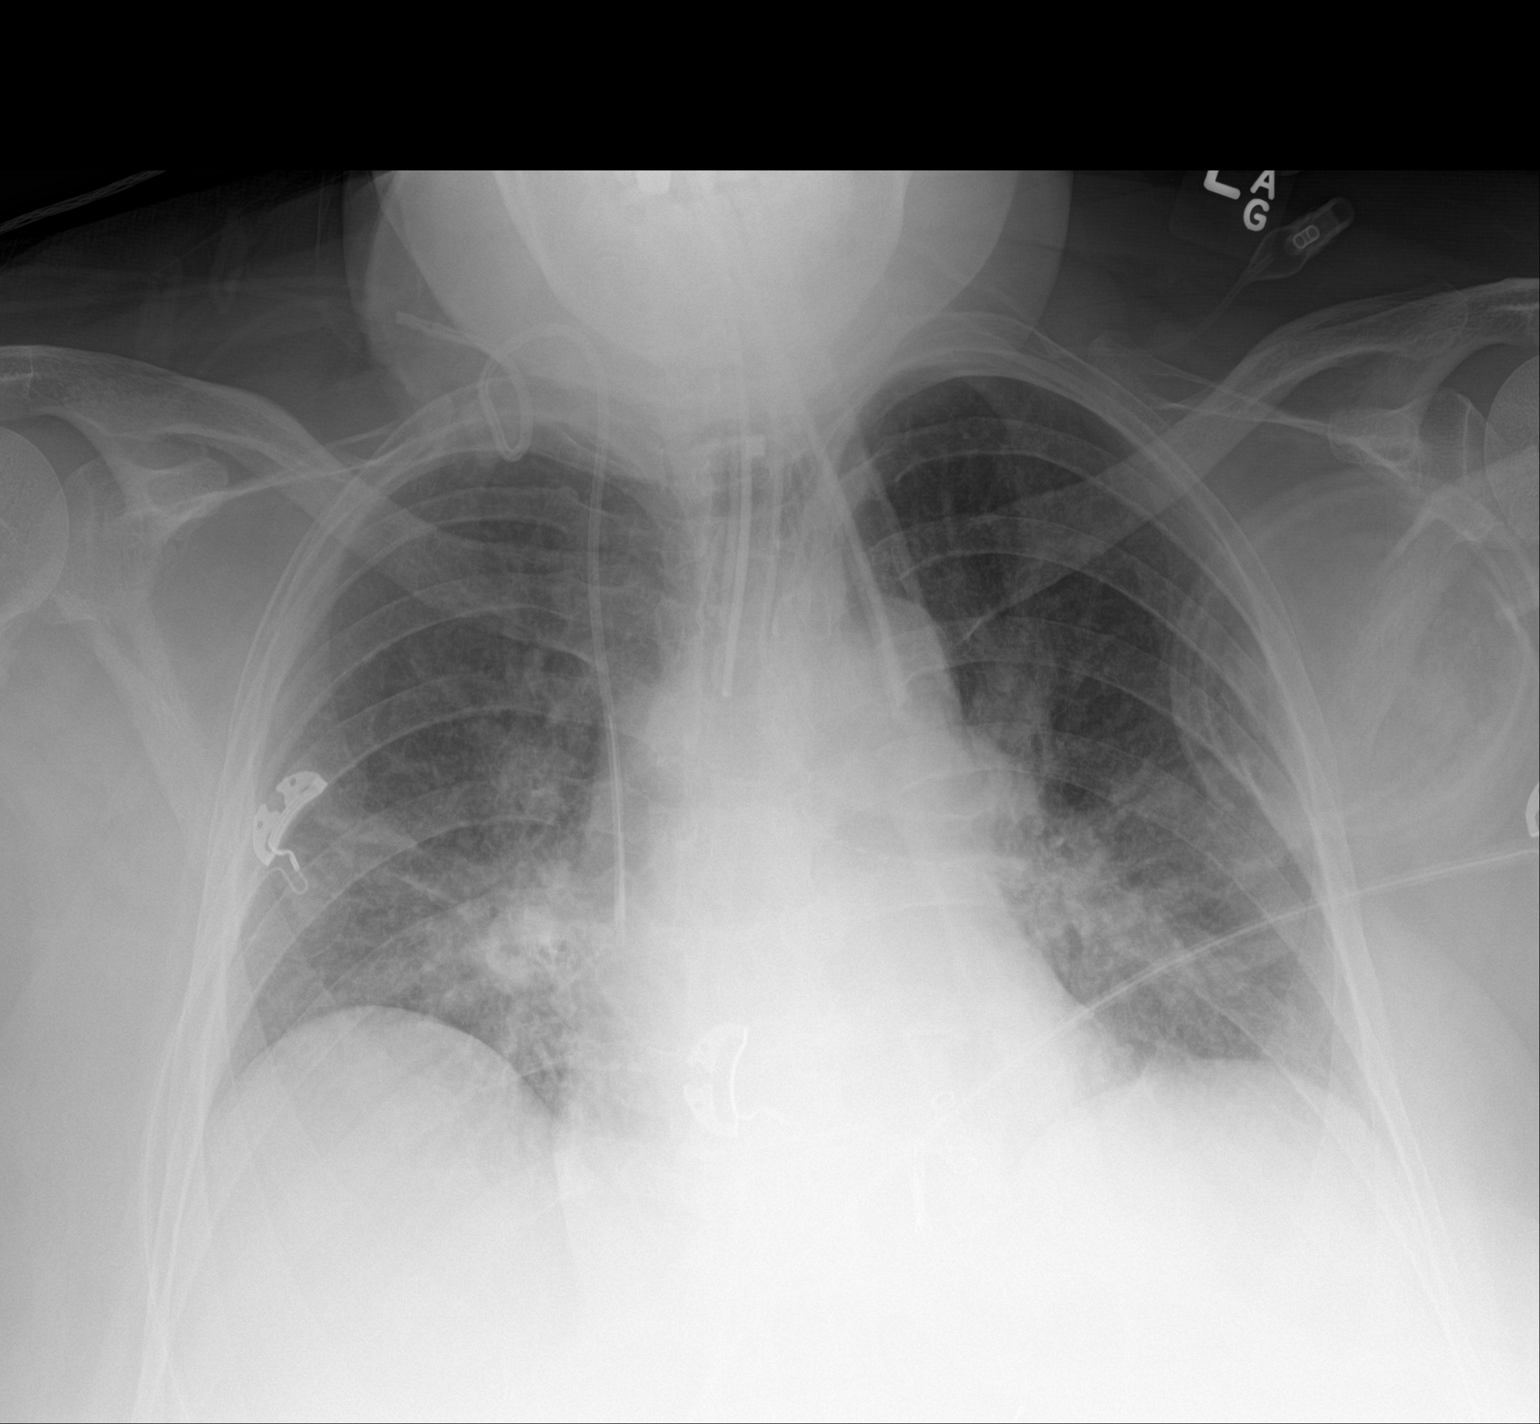

[1 of 1 positions shown; findings below may reference images not displayed]

FINDINGS: Endotracheal tube and right central line remain in place, unchanged.
Patchy perihilar and lower lobe opacities could reflect edema or
infection. Mild cardiomegaly. No visible significant effusions.
IMPRESSION: Bilateral perihilar and lower lobe opacities could reflect edema or
pneumonia.

## 2017-11-06 IMAGING — CR DG CHEST 1V PORT
1 series · 1 of 1 positions shown · non-contrast
Comparison: None available.

CLINICAL DATA: Initial evaluation for acute hypoxia, increased leg
swelling.

EXAM:
PORTABLE CHEST 1 VIEW

[AP]
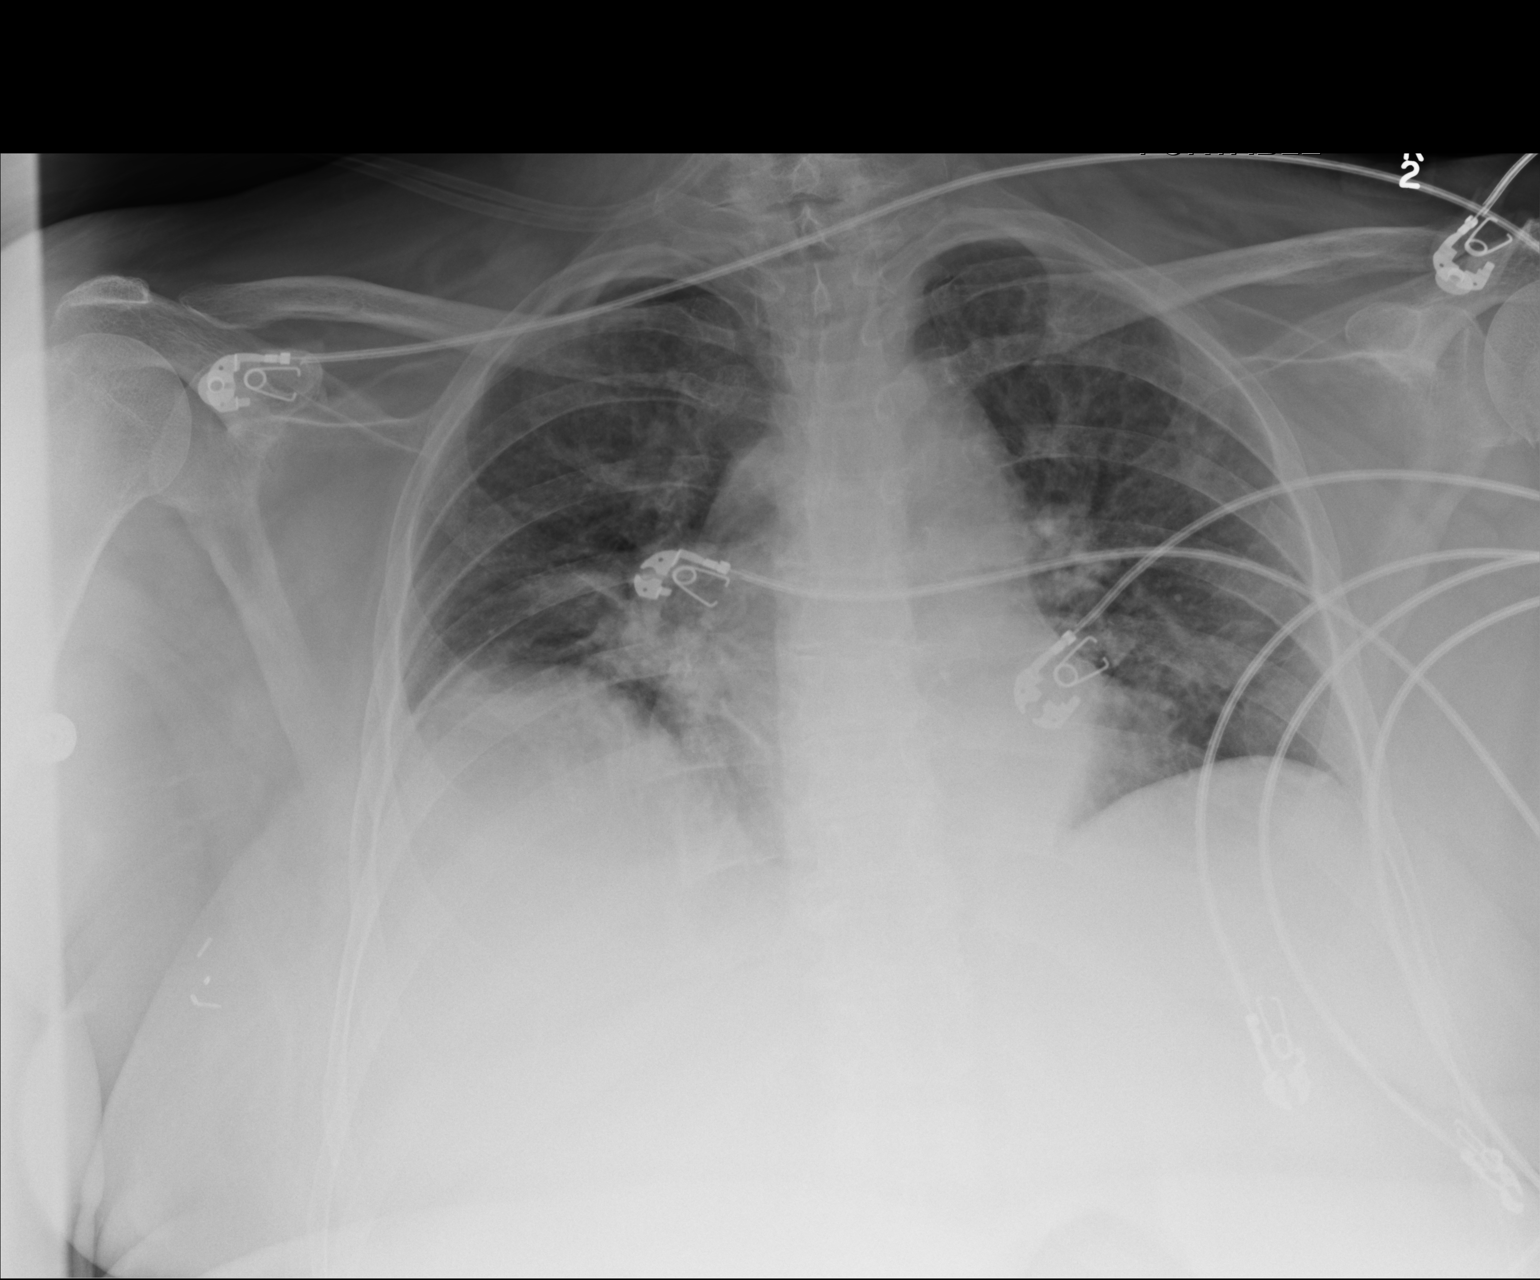

[1 of 1 positions shown; findings below may reference images not displayed]

FINDINGS: Examination is suboptimal due to shallow degree of lung inflation.
Cardiac and mediastinal silhouettes grossly within normal limits.

Lungs hypoinflated. Secondary bibasilar bronchovascular crowding
with atelectasis. Perihilar vascular congestion suspected to in part
be related to shallow degree of lung inflation. No overt pulmonary
edema. No other focal infiltrates. No pneumothorax.

No acute osseous abnormality. Surgical clips overlie the right
axilla.
IMPRESSION: 1. Shallow lung inflation with secondary bibasilar atelectasis and
bronchovascular crowding.
2. No other definite active cardiopulmonary disease.
